# Patient Record
Sex: Female | Born: 1980 | Race: Black or African American | Hispanic: Yes | Marital: Single | Smoking: Current every day smoker
Health system: Southern US, Community
[De-identification: ages and names within clinical notes are randomized; demographics above are authoritative.]

## PROBLEM LIST (undated history)

## (undated) ENCOUNTER — Inpatient Hospital Stay (HOSPITAL_COMMUNITY): Payer: Self-pay

## (undated) DIAGNOSIS — R7303 Prediabetes: Secondary | ICD-10-CM

## (undated) DIAGNOSIS — R002 Palpitations: Secondary | ICD-10-CM

## (undated) DIAGNOSIS — F329 Major depressive disorder, single episode, unspecified: Secondary | ICD-10-CM

## (undated) DIAGNOSIS — F909 Attention-deficit hyperactivity disorder, unspecified type: Secondary | ICD-10-CM

## (undated) DIAGNOSIS — F32A Depression, unspecified: Secondary | ICD-10-CM

## (undated) DIAGNOSIS — I10 Essential (primary) hypertension: Secondary | ICD-10-CM

## (undated) DIAGNOSIS — J069 Acute upper respiratory infection, unspecified: Secondary | ICD-10-CM

## (undated) DIAGNOSIS — G43909 Migraine, unspecified, not intractable, without status migrainosus: Secondary | ICD-10-CM

## (undated) DIAGNOSIS — F419 Anxiety disorder, unspecified: Secondary | ICD-10-CM

## (undated) DIAGNOSIS — M419 Scoliosis, unspecified: Secondary | ICD-10-CM

## (undated) DIAGNOSIS — G8929 Other chronic pain: Secondary | ICD-10-CM

## (undated) DIAGNOSIS — J45909 Unspecified asthma, uncomplicated: Secondary | ICD-10-CM

## (undated) DIAGNOSIS — M549 Dorsalgia, unspecified: Secondary | ICD-10-CM

## (undated) DIAGNOSIS — R000 Tachycardia, unspecified: Secondary | ICD-10-CM

## (undated) DIAGNOSIS — O4400 Placenta previa specified as without hemorrhage, unspecified trimester: Secondary | ICD-10-CM

## (undated) DIAGNOSIS — E663 Overweight: Secondary | ICD-10-CM

## (undated) HISTORY — DX: Overweight: E66.3

## (undated) HISTORY — DX: Other chronic pain: G89.29

## (undated) HISTORY — DX: Palpitations: R00.2

## (undated) HISTORY — PX: WISDOM TOOTH EXTRACTION: SHX21

## (undated) HISTORY — DX: Dorsalgia, unspecified: M54.9

## (undated) HISTORY — DX: Acute upper respiratory infection, unspecified: J06.9

## (undated) HISTORY — PX: TUBAL LIGATION: SHX77

## (undated) HISTORY — DX: Tachycardia, unspecified: R00.0

## (undated) HISTORY — DX: Essential (primary) hypertension: I10

## (undated) HISTORY — DX: Prediabetes: R73.03

---

## 2001-11-14 HISTORY — PX: OTHER SURGICAL HISTORY: SHX169

## 2011-01-21 ENCOUNTER — Emergency Department (HOSPITAL_COMMUNITY)
Admission: EM | Admit: 2011-01-21 | Discharge: 2011-01-21 | Disposition: A | Discharge status: Home / Self Care | Payer: Medicaid - Out of State | Attending: Emergency Medicine | Admitting: Emergency Medicine

## 2011-01-21 DIAGNOSIS — R6884 Jaw pain: Secondary | ICD-10-CM | POA: Insufficient documentation

## 2011-02-15 ENCOUNTER — Inpatient Hospital Stay (INDEPENDENT_AMBULATORY_CARE_PROVIDER_SITE_OTHER)
Admission: RE | Admit: 2011-02-15 | Discharge: 2011-02-15 | Disposition: A | Discharge status: Home / Self Care | Payer: Medicaid - Out of State | Source: Ambulatory Visit | Attending: Family Medicine | Admitting: Family Medicine

## 2011-02-15 DIAGNOSIS — H60399 Other infective otitis externa, unspecified ear: Secondary | ICD-10-CM

## 2011-06-05 ENCOUNTER — Emergency Department (HOSPITAL_COMMUNITY)
Admission: EM | Admit: 2011-06-05 | Discharge: 2011-06-06 | Disposition: A | Discharge status: Home / Self Care | Payer: Medicaid Other | Source: Home / Self Care | Attending: Emergency Medicine | Admitting: Emergency Medicine

## 2011-06-05 DIAGNOSIS — F3289 Other specified depressive episodes: Secondary | ICD-10-CM | POA: Insufficient documentation

## 2011-06-05 DIAGNOSIS — Z79899 Other long term (current) drug therapy: Secondary | ICD-10-CM | POA: Insufficient documentation

## 2011-06-05 DIAGNOSIS — F411 Generalized anxiety disorder: Secondary | ICD-10-CM | POA: Insufficient documentation

## 2011-06-05 DIAGNOSIS — F329 Major depressive disorder, single episode, unspecified: Secondary | ICD-10-CM | POA: Insufficient documentation

## 2011-06-05 DIAGNOSIS — Z5900 Homelessness unspecified: Secondary | ICD-10-CM | POA: Insufficient documentation

## 2011-06-05 DIAGNOSIS — K859 Acute pancreatitis without necrosis or infection, unspecified: Secondary | ICD-10-CM | POA: Insufficient documentation

## 2011-06-05 DIAGNOSIS — F17200 Nicotine dependence, unspecified, uncomplicated: Secondary | ICD-10-CM | POA: Insufficient documentation

## 2011-06-06 ENCOUNTER — Emergency Department (HOSPITAL_COMMUNITY): Payer: Medicaid Other

## 2011-06-06 ENCOUNTER — Emergency Department (HOSPITAL_COMMUNITY)
Admission: EM | Admit: 2011-06-06 | Discharge: 2011-06-07 | Disposition: A | Discharge status: Home / Self Care | Payer: Medicaid Other | Attending: Emergency Medicine | Admitting: Emergency Medicine

## 2011-06-06 LAB — URINE MICROSCOPIC-ADD ON

## 2011-06-06 LAB — WET PREP, GENITAL
Clue Cells Wet Prep HPF POC: NONE SEEN
Trich, Wet Prep: NONE SEEN
Yeast Wet Prep HPF POC: NONE SEEN

## 2011-06-06 LAB — URINALYSIS, ROUTINE W REFLEX MICROSCOPIC
Bilirubin Urine: NEGATIVE
Glucose, UA: NEGATIVE mg/dL
Ketones, ur: NEGATIVE mg/dL
Leukocytes, UA: NEGATIVE
Nitrite: NEGATIVE
Protein, ur: NEGATIVE mg/dL
Specific Gravity, Urine: 1.018 (ref 1.005–1.030)
Urobilinogen, UA: 1 mg/dL (ref 0.0–1.0)
pH: 7.5 (ref 5.0–8.0)

## 2011-06-06 LAB — COMPREHENSIVE METABOLIC PANEL
ALT: 12 U/L (ref 0–35)
AST: 17 U/L (ref 0–37)
Albumin: 3.9 g/dL (ref 3.5–5.2)
Alkaline Phosphatase: 83 U/L (ref 39–117)
BUN: 7 mg/dL (ref 6–23)
CO2: 22 mEq/L (ref 19–32)
Calcium: 8.9 mg/dL (ref 8.4–10.5)
Chloride: 104 mEq/L (ref 96–112)
Creatinine, Ser: 0.48 mg/dL — ABNORMAL LOW (ref 0.50–1.10)
GFR calc Af Amer: >60 mL/min (ref >60)
GFR calc non Af Amer: >60 mL/min (ref >60)
Glucose, Bld: 101 mg/dL — ABNORMAL HIGH (ref 70–99)
Potassium: 3.2 mEq/L — ABNORMAL LOW (ref 3.5–5.1)
Sodium: 136 mEq/L (ref 135–145)
Total Bilirubin: 0.3 mg/dL (ref 0.3–1.2)
Total Protein: 7.5 g/dL (ref 6.0–8.3)

## 2011-06-06 LAB — CBC
HCT: 39.6 % (ref 36.0–46.0)
Hemoglobin: 13.6 g/dL (ref 12.0–15.0)
MCH: 28.8 pg (ref 26.0–34.0)
MCHC: 34.3 g/dL (ref 30.0–36.0)
MCV: 83.7 fL (ref 78.0–100.0)
Platelets: 280 10*3/uL (ref 150–400)
RBC: 4.73 MIL/uL (ref 3.87–5.11)
RDW: 13.9 % (ref 11.5–15.5)
WBC: 10.8 10*3/uL — ABNORMAL HIGH (ref 4.0–10.5)

## 2011-06-06 LAB — DIFFERENTIAL
Basophils Absolute: 0.1 10*3/uL (ref 0.0–0.1)
Basophils Relative: 1 % (ref 0–1)
Eosinophils Absolute: 0.4 10*3/uL (ref 0.0–0.7)
Eosinophils Relative: 4 % (ref 0–5)
Lymphocytes Relative: 24 % (ref 12–46)
Lymphs Abs: 2.6 10*3/uL (ref 0.7–4.0)
Monocytes Absolute: 0.5 10*3/uL (ref 0.1–1.0)
Monocytes Relative: 5 % (ref 3–12)
Neutro Abs: 7.2 10*3/uL (ref 1.7–7.7)
Neutrophils Relative %: 67 % (ref 43–77)

## 2011-06-06 LAB — TYPE AND SCREEN
ABO/RH(D): B POS
Antibody Screen: NEGATIVE

## 2011-06-06 LAB — POCT PREGNANCY, URINE: Preg Test, Ur: POSITIVE

## 2011-06-06 LAB — LIPASE, BLOOD: Lipase: 68 U/L — ABNORMAL HIGH (ref 11–59)

## 2011-06-06 LAB — HCG, QUANTITATIVE, PREGNANCY: hCG, Beta Chain, Quant, S: 1 m[IU]/mL (ref <5)

## 2011-06-06 LAB — ABO/RH: ABO/RH(D): B POS

## 2011-06-07 LAB — GC/CHLAMYDIA PROBE AMP, GENITAL
Chlamydia, DNA Probe: NEGATIVE
GC Probe Amp, Genital: NEGATIVE

## 2011-07-06 ENCOUNTER — Emergency Department (HOSPITAL_COMMUNITY)
Admission: EM | Admit: 2011-07-06 | Discharge: 2011-07-06 | Disposition: A | Discharge status: Home / Self Care | Payer: Medicaid Other | Attending: Emergency Medicine | Admitting: Emergency Medicine

## 2011-07-06 DIAGNOSIS — R109 Unspecified abdominal pain: Secondary | ICD-10-CM | POA: Insufficient documentation

## 2011-07-06 DIAGNOSIS — Z5900 Homelessness unspecified: Secondary | ICD-10-CM | POA: Insufficient documentation

## 2011-07-06 DIAGNOSIS — F17200 Nicotine dependence, unspecified, uncomplicated: Secondary | ICD-10-CM | POA: Insufficient documentation

## 2011-07-06 DIAGNOSIS — O219 Vomiting of pregnancy, unspecified: Secondary | ICD-10-CM | POA: Insufficient documentation

## 2011-07-06 LAB — CBC
HCT: 38.1 % (ref 36.0–46.0)
Hemoglobin: 13.1 g/dL (ref 12.0–15.0)
MCH: 28.5 pg (ref 26.0–34.0)
MCHC: 34.4 g/dL (ref 30.0–36.0)
MCV: 83 fL (ref 78.0–100.0)
Platelets: ADEQUATE 10*3/uL (ref 150–400)
RBC: 4.59 MIL/uL (ref 3.87–5.11)
RDW: 14.1 % (ref 11.5–15.5)
WBC: 12.7 10*3/uL — ABNORMAL HIGH (ref 4.0–10.5)

## 2011-07-06 LAB — COMPREHENSIVE METABOLIC PANEL
ALT: 10 U/L (ref 0–35)
AST: 16 U/L (ref 0–37)
Albumin: 3.7 g/dL (ref 3.5–5.2)
Alkaline Phosphatase: 78 U/L (ref 39–117)
BUN: 9 mg/dL (ref 6–23)
CO2: 20 mEq/L (ref 19–32)
Calcium: 8.8 mg/dL (ref 8.4–10.5)
Chloride: 107 mEq/L (ref 96–112)
Creatinine, Ser: 0.52 mg/dL (ref 0.50–1.10)
GFR calc Af Amer: >60 mL/min (ref >60)
GFR calc non Af Amer: >60 mL/min (ref >60)
Glucose, Bld: 83 mg/dL (ref 70–99)
Potassium: 3.9 mEq/L (ref 3.5–5.1)
Sodium: 136 mEq/L (ref 135–145)
Total Bilirubin: 0.2 mg/dL — ABNORMAL LOW (ref 0.3–1.2)
Total Protein: 7.6 g/dL (ref 6.0–8.3)

## 2011-07-06 LAB — LIPASE, BLOOD: Lipase: 60 U/L — ABNORMAL HIGH (ref 11–59)

## 2011-07-06 LAB — URINALYSIS, ROUTINE W REFLEX MICROSCOPIC
Bilirubin Urine: NEGATIVE
Glucose, UA: NEGATIVE mg/dL
Hgb urine dipstick: NEGATIVE
Ketones, ur: NEGATIVE mg/dL
Leukocytes, UA: NEGATIVE
Nitrite: NEGATIVE
Protein, ur: NEGATIVE mg/dL
Specific Gravity, Urine: 1.029 (ref 1.005–1.030)
Urobilinogen, UA: 0.2 mg/dL (ref 0.0–1.0)
pH: 5.5 (ref 5.0–8.0)

## 2011-07-06 LAB — DIFFERENTIAL
Basophils Absolute: 0.1 10*3/uL (ref 0.0–0.1)
Basophils Relative: 1 % (ref 0–1)
Eosinophils Absolute: 0.4 10*3/uL (ref 0.0–0.7)
Eosinophils Relative: 3 % (ref 0–5)
Lymphocytes Relative: 25 % (ref 12–46)
Lymphs Abs: 3.2 10*3/uL (ref 0.7–4.0)
Monocytes Absolute: 0.8 10*3/uL (ref 0.1–1.0)
Monocytes Relative: 6 % (ref 3–12)
Neutro Abs: 8.2 10*3/uL — ABNORMAL HIGH (ref 1.7–7.7)
Neutrophils Relative %: 65 % (ref 43–77)

## 2011-07-06 LAB — HCG, QUANTITATIVE, PREGNANCY: hCG, Beta Chain, Quant, S: 53 m[IU]/mL — ABNORMAL HIGH (ref <5)

## 2011-07-06 LAB — POCT PREGNANCY, URINE: Preg Test, Ur: POSITIVE

## 2011-07-07 ENCOUNTER — Inpatient Hospital Stay (EMERGENCY_DEPARTMENT_HOSPITAL)
Admission: AD | Admit: 2011-07-07 | Discharge: 2011-07-08 | Disposition: A | Discharge status: Home / Self Care | Payer: Medicaid Other | Source: Ambulatory Visit | Attending: Obstetrics & Gynecology | Admitting: Obstetrics & Gynecology

## 2011-07-07 ENCOUNTER — Inpatient Hospital Stay (HOSPITAL_COMMUNITY): Payer: Medicaid Other

## 2011-07-07 ENCOUNTER — Encounter (HOSPITAL_COMMUNITY): Payer: Self-pay

## 2011-07-07 DIAGNOSIS — B9689 Other specified bacterial agents as the cause of diseases classified elsewhere: Secondary | ICD-10-CM

## 2011-07-07 DIAGNOSIS — R109 Unspecified abdominal pain: Secondary | ICD-10-CM | POA: Insufficient documentation

## 2011-07-07 DIAGNOSIS — A499 Bacterial infection, unspecified: Secondary | ICD-10-CM

## 2011-07-07 DIAGNOSIS — N760 Acute vaginitis: Secondary | ICD-10-CM

## 2011-07-07 DIAGNOSIS — O26899 Other specified pregnancy related conditions, unspecified trimester: Secondary | ICD-10-CM

## 2011-07-07 DIAGNOSIS — O99891 Other specified diseases and conditions complicating pregnancy: Secondary | ICD-10-CM | POA: Insufficient documentation

## 2011-07-07 LAB — CBC
HCT: 39.6 % (ref 36.0–46.0)
Hemoglobin: 13.6 g/dL (ref 12.0–15.0)
MCH: 28.8 pg (ref 26.0–34.0)
MCHC: 34.3 g/dL (ref 30.0–36.0)
MCV: 83.9 fL (ref 78.0–100.0)
Platelets: 297 10*3/uL (ref 150–400)
RBC: 4.72 MIL/uL (ref 3.87–5.11)
RDW: 14.1 % (ref 11.5–15.5)
WBC: 9.6 10*3/uL (ref 4.0–10.5)

## 2011-07-07 LAB — HCG, QUANTITATIVE, PREGNANCY: hCG, Beta Chain, Quant, S: 93 m[IU]/mL — ABNORMAL HIGH (ref <5)

## 2011-07-07 NOTE — ED Provider Notes (Signed)
History     Chief Complaint  Patient presents with  . Follow-up   HPI Here c/o low abdomen pain. Was seen at [facility] yesterday, quant of 53 at that time. Was told to f/u here for repeat quant and u/s. No bleeding.   OB History    Grav Para Term Preterm Abortions TAB SAB Ect Mult Living   1               No past medical history on file.  No past surgical history on file.  No family history on file.  History  Substance Use Topics  . Smoking status: Not on file  . Smokeless tobacco: Not on file  . Alcohol Use: Not on file    Allergies: Allergies not on file  No prescriptions prior to admission    Review of Systems  Constitutional: Negative.   Respiratory: Negative.   Cardiovascular: Negative.   Gastrointestinal: Positive for nausea and abdominal pain. Negative for vomiting, diarrhea and constipation.  Genitourinary: Negative for dysuria, urgency, frequency, hematuria and flank pain.       Negative for vaginal bleeding, cramping/contractions  Musculoskeletal: Negative.   Neurological: Negative.   Psychiatric/Behavioral: Negative.     Physical Exam   Blood pressure 130/82, pulse 76, temperature 98.5 F (36.9 C), temperature source Oral, resp. rate 16, height 5\' 3"  (1.6 m), weight 75.841 kg (167 lb 3.2 oz), last menstrual period 06/05/2011, SpO2 99.00%.  Physical Exam  Nursing note and vitals reviewed. Constitutional: She is oriented to person, place, and time. She appears well-developed and well-nourished. No distress.  HENT:  Head: Normocephalic and atraumatic.  Cardiovascular: Normal rate.   Respiratory: Effort normal.  GI: Soft. Bowel sounds are normal. She exhibits no mass. There is no tenderness. There is no rebound and no guarding.  Genitourinary: There is no rash or lesion on the right labia. There is no rash or lesion on the left labia. Uterus is not enlarged and not tender. Cervix exhibits no motion tenderness, no discharge and no friability. Right adnexum  displays no mass, no tenderness and no fullness. Left adnexum displays no mass, no tenderness and no fullness. No tenderness or bleeding around the vagina. Vaginal discharge (white) found.  Musculoskeletal: Normal range of motion.  Neurological: She is alert and oriented to person, place, and time.  Skin: Skin is warm and dry.  Psychiatric: She has a normal mood and affect.    MAU Course  Procedures  Results for orders placed during the hospital encounter of 07/07/11 (from the past 24 hour(s))  CBC     Status: Normal   Collection Time   07/07/11  7:04 PM      Component Value Range   WBC 9.6  4.0 - 10.5 (K/uL)   RBC 4.72  3.87 - 5.11 (MIL/uL)   Hemoglobin 13.6  12.0 - 15.0 (g/dL)   HCT 16.1  09.6 - 04.5 (%)   MCV 83.9  78.0 - 100.0 (fL)   MCH 28.8  26.0 - 34.0 (pg)   MCHC 34.3  30.0 - 36.0 (g/dL)   RDW 40.9  81.1 - 91.4 (%)   Platelets 297  150 - 400 (K/uL)  HCG, QUANTITATIVE, PREGNANCY     Status: Abnormal   Collection Time   07/07/11  7:04 PM      Component Value Range   hCG, Beta Chain, Quant, S 93 (*) <5 (mIU/mL)  WET PREP, GENITAL     Status: Abnormal   Collection Time   07/07/11  11:57 PM      Component Value Range   Yeast, Wet Prep NONE SEEN  NONE SEEN    Trich, Wet Prep NONE SEEN  NONE SEEN    Clue Cells, Wet Prep FEW (*) NONE SEEN    WBC, Wet Prep HPF POC FEW (*) NONE SEEN    US Ob Comp Less 14 Wks  07/07/2011  *RADIOLOGY REPORT*  Clinical Data: Abdominal pain.  OBSTETRIC <14 WK [place] AND TRANSVAGINAL OB US  Technique:  Both transabdominal and transvaginal ultrasound examinations were performed for complete evaluation of the gestation as well as the maternal uterus, adnexal regions, and pelvic cul-de-sac.  Transvaginal technique was performed to assess early pregnancy.  Comparison:  Prior ultrasound of pregnancy performed 06/06/2011.  Intrauterine gestational sac:  None seen. Yolk sac: N/A Embryo: N/A Cardiac Activity: N/A  Maternal uterus/adnexae: There is a irregular 3.2 x  0.7 x 1.2 cm collection of relatively hypoechoic fluid within the endometrial canal at the lower uterine segment.  This may reflect trace bleeding; no intrauterine gestational sac is identified.  The patient's quantitative beta-HCG of 93 remains far too low to characterize the gestational sac or exclude ectopic pregnancy.  The ovaries are unremarkable in appearance.  A heterogeneous 1.9 cm focus within the right ovary may reflect a mildly hemorrhagic corpus luteal cyst.  The right ovary measures 4.5 x 2.6 x 2.4 cm, while the left ovary measures 3.3 x 1.7 x 1.3 cm.  There is no evidence for ovarian torsion.  A small amount of free fluid is noted within the pelvis.  IMPRESSION:  1.  No intrauterine gestational sac identified.  The patient's quantitative beta-HCG of 93 remains far similar to characterize the gestational sac or exclude ectopic pregnancy.  Would perform follow- up pelvic ultrasound in 2 weeks to assess for intrauterine gestational sac. 2.  Irregular collection of relatively hypoechoic fluid in the endometrial canal at the lower uterine segment; this may reflect a small amount of blood within the endometrial canal.  Original Report Authenticated By: Tonia Ghent, M.D.    Assessment and Plan  30 y.o. [id number] at [redacted]w[redacted]d Early pregnancy with abdominal pain F/U in 48 hours for repeat quant Precautions rev'd   Archie Shea 07/07/2011, 10:00 PM

## 2011-07-07 NOTE — Progress Notes (Signed)
Pt sates she was seen at [facility] on 8-22, had a BHCG and was instructed to return to MAU for BHCG and ultrasound. Pt states she has some pain off and on.

## 2011-07-08 ENCOUNTER — Inpatient Hospital Stay (HOSPITAL_COMMUNITY)
Admission: AD | Admit: 2011-07-08 | Discharge: 2011-07-09 | Disposition: A | Discharge status: Home / Self Care | Payer: Medicaid Other | Source: Ambulatory Visit | Attending: Obstetrics & Gynecology | Admitting: Obstetrics & Gynecology

## 2011-07-08 ENCOUNTER — Encounter (HOSPITAL_COMMUNITY): Payer: Self-pay | Admitting: Advanced Practice Midwife

## 2011-07-08 DIAGNOSIS — O26899 Other specified pregnancy related conditions, unspecified trimester: Secondary | ICD-10-CM | POA: Diagnosis present

## 2011-07-08 DIAGNOSIS — R109 Unspecified abdominal pain: Secondary | ICD-10-CM | POA: Insufficient documentation

## 2011-07-08 DIAGNOSIS — N760 Acute vaginitis: Secondary | ICD-10-CM | POA: Diagnosis present

## 2011-07-08 DIAGNOSIS — O210 Mild hyperemesis gravidarum: Secondary | ICD-10-CM | POA: Insufficient documentation

## 2011-07-08 DIAGNOSIS — B9689 Other specified bacterial agents as the cause of diseases classified elsewhere: Secondary | ICD-10-CM | POA: Diagnosis present

## 2011-07-08 DIAGNOSIS — O219 Vomiting of pregnancy, unspecified: Secondary | ICD-10-CM | POA: Diagnosis present

## 2011-07-08 LAB — WET PREP, GENITAL
Trich, Wet Prep: NONE SEEN
Yeast Wet Prep HPF POC: NONE SEEN

## 2011-07-08 MED ORDER — ACETAMINOPHEN 325 MG PO TABS
650.0000 mg | ORAL_TABLET | Freq: Once | ORAL | Status: AC
  Administered 2011-07-08: 650 mg via ORAL
  Filled 2011-07-08: qty 2

## 2011-07-08 MED ORDER — PROMETHAZINE HCL 25 MG PO TABS
25.0000 mg | ORAL_TABLET | Freq: Once | ORAL | Status: AC
  Administered 2011-07-08: 25 mg via ORAL
  Filled 2011-07-08: qty 1

## 2011-07-08 MED ORDER — PROMETHAZINE HCL 25 MG PO TABS: 25.0000 mg | ORAL_TABLET | Freq: Once | ORAL | Status: DC

## 2011-07-08 MED ORDER — METRONIDAZOLE 500 MG PO TABS: 500.0000 mg | ORAL_TABLET | Freq: Two times a day (BID) | ORAL | Status: AC

## 2011-07-08 NOTE — Progress Notes (Signed)
Pt given taxi voucher per house coverage.

## 2011-07-09 ENCOUNTER — Encounter (HOSPITAL_COMMUNITY): Payer: Self-pay | Admitting: *Deleted

## 2011-07-09 DIAGNOSIS — O219 Vomiting of pregnancy, unspecified: Secondary | ICD-10-CM | POA: Diagnosis present

## 2011-07-09 LAB — URINALYSIS, ROUTINE W REFLEX MICROSCOPIC
Bilirubin Urine: NEGATIVE
Glucose, UA: NEGATIVE mg/dL
Hgb urine dipstick: NEGATIVE
Ketones, ur: NEGATIVE mg/dL
Leukocytes, UA: NEGATIVE
Nitrite: NEGATIVE
Protein, ur: NEGATIVE mg/dL
Specific Gravity, Urine: >1.03 — ABNORMAL HIGH (ref 1.005–1.030)
Urobilinogen, UA: 1 mg/dL (ref 0.0–1.0)
pH: 6 (ref 5.0–8.0)

## 2011-07-09 MED ORDER — ONDANSETRON 8 MG PO TBDP
8.0000 mg | ORAL_TABLET | Freq: Once | ORAL | Status: AC
  Administered 2011-07-09: 8 mg via ORAL
  Filled 2011-07-09: qty 1

## 2011-07-09 NOTE — ED Notes (Signed)
Penny [name] CNM spoke to pt after Triage and before pt brought back to room. Was seen MAU last pm and has not started med for BV as prescribed last pm. G3P2  At 5wks. Having nausea and some cramping

## 2011-07-09 NOTE — ED Provider Notes (Signed)
History     Chief Complaint  Patient presents with  . Abdominal Cramping  . Chills   HPI Pt c/o nausea and vomiting. Vomited 5 times in last 24 hours. Tolerating PO fluids. Also has ongoing cramping. Seen yesterday in MAU for cramping, u/s negative, has f/u HCG planned for tomorrow. + BV yesterday. Was given rx for flagyl and phenergan, did not get filled, cannot afford. Lives in homeless shelter.    OB History    Grav Para Term Preterm Abortions TAB SAB Ect Mult Living   3 2 2       2       No past medical history on file.  No past surgical history on file.  No family history on file.  History  Substance Use Topics  . Smoking status: Not on file  . Smokeless tobacco: Not on file  . Alcohol Use: Not on file    Allergies: No Known Allergies  Prescriptions prior to admission  Medication Sig Dispense Refill  . metroNIDAZOLE (FLAGYL) 500 MG tablet Take 1 tablet (500 mg total) by mouth 2 (two) times daily.  14 tablet  0  . promethazine (PHENERGAN) 25 MG tablet Take 1 tablet (25 mg total) by mouth once.  30 tablet  0    Review of Systems  Constitutional: Negative.   Respiratory: Negative.   Cardiovascular: Negative.   Gastrointestinal: Positive for nausea and vomiting. Negative for abdominal pain, diarrhea and constipation.  Genitourinary: Negative for dysuria, urgency, frequency, hematuria and flank pain.       Negative for vaginal bleeding, positive for cramping  Musculoskeletal: Negative.   Neurological: Negative.   Psychiatric/Behavioral: Negative.    Physical Exam   Blood pressure 114/79, pulse 80, temperature 98.4 F (36.9 C), resp. rate 18, last menstrual period 06/05/2011.  Physical Exam  Constitutional: She is oriented to person, place, and time. She appears well-developed and well-nourished. No distress.  Cardiovascular: Normal rate.   Respiratory: Effort normal.  Musculoskeletal: Normal range of motion.  Neurological: She is alert and oriented to person,  place, and time.  Skin: Skin is warm and dry.  Psychiatric: She has a normal mood and affect.    MAU Course  Procedures  Results for orders placed during the hospital encounter of 07/08/11 (from the past 24 hour(s))  URINALYSIS, ROUTINE W REFLEX MICROSCOPIC     Status: Abnormal   Collection Time   07/09/11  1:06 AM      Component Value Range   Color, Urine YELLOW  YELLOW    Appearance CLEAR  CLEAR    Specific Gravity, Urine >1.030 (*) 1.005 - 1.030    pH 6.0  5.0 - 8.0    Glucose, UA NEGATIVE  NEGATIVE (mg/dL)   Hgb urine dipstick NEGATIVE  NEGATIVE    Bilirubin Urine NEGATIVE  NEGATIVE    Ketones, ur NEGATIVE  NEGATIVE (mg/dL)   Protein, ur NEGATIVE  NEGATIVE (mg/dL)   Urobilinogen, UA 1.0  0.0 - 1.0 (mg/dL)   Nitrite NEGATIVE  NEGATIVE    Leukocytes, UA NEGATIVE  NEGATIVE    Nausea improved with Zofran ODT  Tolerating PO fluids and crackers  Assessment and Plan  30 y.o. [id number] at [redacted]w[redacted]d N/V of early pregnancy Encouraged to get phenergan rx filled F/U tomorrow for repeat quant  Tyrihanna Wingert 07/09/2011, 2:23 AM

## 2011-07-09 NOTE — Progress Notes (Signed)
Pt states, " I am having vomiting and thrown up four times today. I'm cramping more today, and I m not suppossed to return for another [place] for a week. I was here last night and had an [place]."

## 2011-07-10 ENCOUNTER — Inpatient Hospital Stay (HOSPITAL_COMMUNITY)
Admission: AD | Admit: 2011-07-10 | Discharge: 2011-07-10 | Disposition: A | Discharge status: Home / Self Care | Payer: Medicaid Other | Source: Ambulatory Visit | Attending: Obstetrics and Gynecology | Admitting: Obstetrics and Gynecology

## 2011-07-10 ENCOUNTER — Inpatient Hospital Stay (HOSPITAL_COMMUNITY): Admit: 2011-07-10 | Payer: Medicaid Other

## 2011-07-10 DIAGNOSIS — O26899 Other specified pregnancy related conditions, unspecified trimester: Secondary | ICD-10-CM

## 2011-07-10 DIAGNOSIS — O2690 Pregnancy related conditions, unspecified, unspecified trimester: Secondary | ICD-10-CM | POA: Insufficient documentation

## 2011-07-10 DIAGNOSIS — R109 Unspecified abdominal pain: Secondary | ICD-10-CM

## 2011-07-10 LAB — HCG, QUANTITATIVE, PREGNANCY: hCG, Beta Chain, Quant, S: 336 m[IU]/mL — ABNORMAL HIGH (ref <5)

## 2011-07-10 NOTE — ED Provider Notes (Addendum)
History     Chief Complaint  Patient presents with  . Possible Pregnancy   HPI  Penny Davis71 y.o. presents for followup BHCG>  She was initially seen at [facility] on 8/22 with BHCG 53.  She returned here on 8/23 with abdominal pain in early pregnancy without bleeding.  On that date her BLOOD TYPE --B+. BHCG 93. Ultrasound did not see Yolk Sac, Fetal Pole or Cardiac activity--too early.  She was dx with Bacterial Vaginosis and given Rxs for Flagyl and Phenergan.  She returned yesterday with vomiting and cramping.  Had not filled Phenergan Rx.   She denies bleeding and pain today.    Past Medical History  Diagnosis Date  . No pertinent past medical history     Past Surgical History  Procedure Date  . Cesarean section     No family history on file.  History  Substance Use Topics  . Smoking status: Current Everyday Smoker  . Smokeless tobacco: Never Used  . Alcohol Use: No     PReviously drank on occ    Allergies: No Known Allergies  Prescriptions prior to admission  Medication Sig Dispense Refill  . metroNIDAZOLE (FLAGYL) 500 MG tablet Take 1 tablet (500 mg total) by mouth 2 (two) times daily.  14 tablet  0  . promethazine (PHENERGAN) 25 MG tablet Take 1 tablet (25 mg total) by mouth once.  30 tablet  0    ROS Physical Exam   Blood pressure 120/65, pulse 65, temperature 98.8 F (37.1 C), temperature source Oral, resp. rate 16, last menstrual period 06/05/2011.  Physical Exam  Not indicated.   Results for orders placed during the hospital encounter of 07/10/11 (from the past 24 hour(s))  HCG, QUANTITATIVE, PREGNANCY     Status: Abnormal   Collection Time   07/10/11  7:31 AM      Component Value Range   hCG, Beta Chain, Quant, S 336 (*) <5 (mIU/mL)   MAU Course  Procedures  Repeat BHCG  MDM  Reported patient's hx and BHCG results to Dr. Emelda Fear.  Pt denies bleeding and pain today.  Order given for repeat U/S in 1 week for viability.  Ectopic precautions will  be given to patient.    Assessment and Plan  Assessement: Early gestation with rising BHCGs  Plan  Repeat Ultrasound in 1 week for viability--they will call her to make appointment.           Ectopic precautions given to patient.     Hansford Hirt,EVE M 07/10/2011, 8:41 AM

## 2011-07-10 NOTE — Progress Notes (Signed)
No bleeding no pain

## 2011-07-12 ENCOUNTER — Other Ambulatory Visit: Payer: Self-pay | Admitting: Obstetrics & Gynecology

## 2011-07-12 DIAGNOSIS — Z1389 Encounter for screening for other disorder: Secondary | ICD-10-CM

## 2011-07-14 ENCOUNTER — Encounter (HOSPITAL_COMMUNITY): Payer: Self-pay | Admitting: *Deleted

## 2011-07-14 ENCOUNTER — Inpatient Hospital Stay (HOSPITAL_COMMUNITY)
Admission: AD | Admit: 2011-07-14 | Discharge: 2011-07-15 | Disposition: A | Discharge status: Home / Self Care | Payer: Medicaid Other | Source: Ambulatory Visit | Attending: Obstetrics & Gynecology | Admitting: Obstetrics & Gynecology

## 2011-07-14 DIAGNOSIS — O99891 Other specified diseases and conditions complicating pregnancy: Secondary | ICD-10-CM | POA: Insufficient documentation

## 2011-07-14 DIAGNOSIS — O34599 Maternal care for other abnormalities of gravid uterus, unspecified trimester: Secondary | ICD-10-CM | POA: Insufficient documentation

## 2011-07-14 DIAGNOSIS — O26899 Other specified pregnancy related conditions, unspecified trimester: Secondary | ICD-10-CM

## 2011-07-14 DIAGNOSIS — R109 Unspecified abdominal pain: Secondary | ICD-10-CM

## 2011-07-14 DIAGNOSIS — N83209 Unspecified ovarian cyst, unspecified side: Secondary | ICD-10-CM

## 2011-07-14 LAB — CBC
HCT: 37.9 % (ref 36.0–46.0)
Hemoglobin: 12.7 g/dL (ref 12.0–15.0)
MCH: 28.4 pg (ref 26.0–34.0)
MCHC: 33.5 g/dL (ref 30.0–36.0)
MCV: 84.8 fL (ref 78.0–100.0)
Platelets: 302 10*3/uL (ref 150–400)
RBC: 4.47 MIL/uL (ref 3.87–5.11)
RDW: 14.6 % (ref 11.5–15.5)
WBC: 12 10*3/uL — ABNORMAL HIGH (ref 4.0–10.5)

## 2011-07-14 LAB — DIFFERENTIAL
Basophils Absolute: 0.1 10*3/uL (ref 0.0–0.1)
Basophils Relative: 1 % (ref 0–1)
Eosinophils Absolute: 0.3 10*3/uL (ref 0.0–0.7)
Eosinophils Relative: 3 % (ref 0–5)
Lymphocytes Relative: 27 % (ref 12–46)
Lymphs Abs: 3.3 10*3/uL (ref 0.7–4.0)
Monocytes Absolute: 0.5 10*3/uL (ref 0.1–1.0)
Monocytes Relative: 4 % (ref 3–12)
Neutro Abs: 7.8 10*3/uL — ABNORMAL HIGH (ref 1.7–7.7)
Neutrophils Relative %: 65 % (ref 43–77)

## 2011-07-14 NOTE — Progress Notes (Signed)
Pt states she has pain in her rt lower groin are/side area present x 2 days that has gotten worse and worse and also has some cramping

## 2011-07-14 NOTE — ED Provider Notes (Signed)
History     CSN: [id number] Arrival date & time: 07/14/2011 10:54 PM  Chief Complaint  Patient presents with  . Abdominal Pain   HPI Penny [name] is a 30 y.o. female who presents to MAU with pain in her right lower abdomen and groin. She is [redacted] weeks gestation by LMP. Her initial visit was to [facility] on 07/06/11. She had a positive pregnancy test at that time and her Bhcg was 55. Her blood type is B positive. She came to MAU for evaluation 07/07/11 and her Bhcg was 93, and on 8/26 had increased to 333. Ultrasound showed no IUGS. She is scheduled to have a follow up ultrasound next week. The pain has gotten much worse tonight on the right side and associated symptoms include nausea.  Past Medical History  Diagnosis Date  . No pertinent past medical history     Past Surgical History  Procedure Date  . Cesarean section     No family history on file.  History  Substance Use Topics  . Smoking status: Current Everyday Smoker -- 0.5 packs/day  . Smokeless tobacco: Never Used  . Alcohol Use: No     PReviously drank on occ    OB History    Grav Para Term Preterm Abortions TAB SAB Ect Mult Living   3 2 2       2       Review of Systems  Constitutional: Positive for chills, appetite change and fatigue. Negative for fever and diaphoresis.  HENT: Positive for sneezing. Negative for ear pain, congestion, sore throat, facial swelling, neck pain, neck stiffness, dental problem and sinus pressure.   Eyes: Negative for photophobia, pain and discharge.  Respiratory: Positive for cough. Negative for chest tightness and wheezing.   Cardiovascular: Negative.   Gastrointestinal: Positive for nausea, abdominal pain and diarrhea. Negative for vomiting, constipation and abdominal distention.  Genitourinary: Positive for frequency, vaginal pain and pelvic pain. Negative for dysuria, flank pain, vaginal bleeding, vaginal discharge and difficulty urinating.  Musculoskeletal: Positive for back pain.  Negative for myalgias and gait problem.  Skin: Negative for color change and rash.  Neurological: Negative for dizziness, speech difficulty, weakness, light-headedness, numbness and headaches.  Psychiatric/Behavioral: Negative for confusion and agitation.    Physical Exam  BP 134/79  Pulse 79  Temp 98.8 F (37.1 C)  Resp 22  Ht 5\' 4"  (1.626 m)  Wt 169 lb (76.658 kg)  BMI 29.01 kg/m2  LMP 06/05/2011  Physical Exam  Nursing note and vitals reviewed. Constitutional: She is oriented to person, place, and time. She appears well-developed and well-nourished.  Eyes: EOM are normal.  Neck: Neck supple.  Pulmonary/Chest: Effort normal.  Abdominal: Soft. There is no tenderness.  Musculoskeletal: Normal range of motion.  Neurological: She is alert and oriented to person, place, and time. No cranial nerve deficit.  Skin: Skin is warm and dry.    ED Course  Procedures Will repeat ultrasound tonight re: increased pain in lower abdomen. MDM Ultrasound tonight shows a 5 week 1 day IUGS with YS. There is a 2.0 x 1.6 x 1.6 CLC on the right ovary, small amount of FF. I discussed the ultrasound results with the patient and pain associated with CLC.  Assessment: First trimester abdominal pain                        Corpus luteal cyst   Plan:  Tylenol for pain   Cancel ultrasound for next week.  Start Prenatal care

## 2011-07-15 ENCOUNTER — Inpatient Hospital Stay (HOSPITAL_COMMUNITY): Payer: Medicaid Other

## 2011-07-15 LAB — URINALYSIS, ROUTINE W REFLEX MICROSCOPIC
Bilirubin Urine: NEGATIVE
Glucose, UA: NEGATIVE mg/dL
Ketones, ur: NEGATIVE mg/dL
Leukocytes, UA: NEGATIVE
Nitrite: NEGATIVE
Protein, ur: NEGATIVE mg/dL
Specific Gravity, Urine: 1.025 (ref 1.005–1.030)
Urobilinogen, UA: 0.2 mg/dL (ref 0.0–1.0)
pH: 6 (ref 5.0–8.0)

## 2011-07-15 LAB — URINE MICROSCOPIC-ADD ON

## 2011-07-15 LAB — HCG, QUANTITATIVE, PREGNANCY: hCG, Beta Chain, Quant, S: 2292 m[IU]/mL — ABNORMAL HIGH (ref <5)

## 2011-07-15 MED ORDER — ONDANSETRON 8 MG PO TBDP
8.0000 mg | ORAL_TABLET | Freq: Once | ORAL | Status: AC
  Administered 2011-07-15: 8 mg via ORAL
  Filled 2011-07-15: qty 1

## 2011-07-15 MED ORDER — PROMETHAZINE HCL 25 MG PO TABS: 25.0000 mg | ORAL_TABLET | Freq: Four times a day (QID) | ORAL | Status: DC | PRN

## 2011-07-15 MED ORDER — IBUPROFEN 600 MG PO TABS
600.0000 mg | ORAL_TABLET | Freq: Once | ORAL | Status: AC
  Administered 2011-07-15: 600 mg via ORAL
  Filled 2011-07-15: qty 1

## 2011-07-19 ENCOUNTER — Ambulatory Visit (HOSPITAL_COMMUNITY): Admission: RE | Admit: 2011-07-19 | Payer: Medicaid Other | Source: Ambulatory Visit

## 2011-07-19 NOTE — ED Provider Notes (Signed)
Agree with above note.  Ellison Leisure H. 07/19/2011 11:59 PM

## 2011-07-23 ENCOUNTER — Inpatient Hospital Stay (HOSPITAL_COMMUNITY)
Admission: AD | Admit: 2011-07-23 | Discharge: 2011-07-23 | Disposition: A | Discharge status: Home / Self Care | Payer: Medicaid Other | Source: Ambulatory Visit | Attending: Obstetrics and Gynecology | Admitting: Obstetrics and Gynecology

## 2011-07-23 ENCOUNTER — Encounter (HOSPITAL_COMMUNITY): Payer: Self-pay

## 2011-07-23 DIAGNOSIS — F329 Major depressive disorder, single episode, unspecified: Secondary | ICD-10-CM | POA: Insufficient documentation

## 2011-07-23 DIAGNOSIS — O9989 Other specified diseases and conditions complicating pregnancy, childbirth and the puerperium: Secondary | ICD-10-CM | POA: Insufficient documentation

## 2011-07-23 DIAGNOSIS — L293 Anogenital pruritus, unspecified: Secondary | ICD-10-CM | POA: Insufficient documentation

## 2011-07-23 DIAGNOSIS — F411 Generalized anxiety disorder: Secondary | ICD-10-CM | POA: Insufficient documentation

## 2011-07-23 DIAGNOSIS — N898 Other specified noninflammatory disorders of vagina: Secondary | ICD-10-CM

## 2011-07-23 DIAGNOSIS — O99340 Other mental disorders complicating pregnancy, unspecified trimester: Secondary | ICD-10-CM | POA: Insufficient documentation

## 2011-07-23 DIAGNOSIS — F3289 Other specified depressive episodes: Secondary | ICD-10-CM | POA: Insufficient documentation

## 2011-07-23 DIAGNOSIS — Z331 Pregnant state, incidental: Secondary | ICD-10-CM

## 2011-07-23 DIAGNOSIS — G43909 Migraine, unspecified, not intractable, without status migrainosus: Secondary | ICD-10-CM | POA: Insufficient documentation

## 2011-07-23 HISTORY — DX: Depression, unspecified: F32.A

## 2011-07-23 HISTORY — DX: Major depressive disorder, single episode, unspecified: F32.9

## 2011-07-23 HISTORY — DX: Anxiety disorder, unspecified: F41.9

## 2011-07-23 HISTORY — DX: Migraine, unspecified, not intractable, without status migrainosus: G43.909

## 2011-07-23 LAB — URINALYSIS, ROUTINE W REFLEX MICROSCOPIC
Bilirubin Urine: NEGATIVE
Glucose, UA: NEGATIVE mg/dL
Ketones, ur: 15 mg/dL — AB
Nitrite: NEGATIVE
Protein, ur: NEGATIVE mg/dL
Specific Gravity, Urine: >1.03 — ABNORMAL HIGH (ref 1.005–1.030)
Urobilinogen, UA: 1 mg/dL (ref 0.0–1.0)
pH: 5.5 (ref 5.0–8.0)

## 2011-07-23 LAB — URINE MICROSCOPIC-ADD ON

## 2011-07-23 LAB — WET PREP, GENITAL
Clue Cells Wet Prep HPF POC: NONE SEEN
Trich, Wet Prep: NONE SEEN
Yeast Wet Prep HPF POC: NONE SEEN

## 2011-07-23 NOTE — Progress Notes (Signed)
Vaginal itching, burning with yellow vaginal discharge x 3 days, vagina feels swollen, has been seen in MAU for pregnancy

## 2011-07-24 NOTE — ED Provider Notes (Signed)
History     Chief Complaint  Patient presents with  . Vaginal Discharge  . Vaginal Itching   HPI Pt presents with c/o external vaginal itching, burning and feeling of dryness since yesterday.  Denies any increased vag discharge.  Denies any vaginal bleeding.  Denies fever or abdominal pain.  She recently completed a course of metronidazole for BV.  She has not treated her symptoms with any medication.  Pt states she is concerned regarding a possible STD but has no known exposure.  She is scheduled to be seen at [facility] the 1st week of October for her new OB intake.  Pt has been seen at [facility] [place] with US performed which confirmed an IUP with yolk sac, no FHTs at 5w 5d gestation by LMP.  OB History    Grav Para Term Preterm Abortions TAB SAB Ect Mult Living   3 2 1 1  0 0 0 0 0 2      Past Medical History  Diagnosis Date  . Preterm labor   . Anxiety   . Depression   . Migraines     Past Surgical History  Procedure Date  . Cesarean section     No family history on file.  History  Substance Use Topics  . Smoking status: Current Everyday Smoker -- 0.2 packs/day for 12 years    Types: Cigarettes  . Smokeless tobacco: Never Used  . Alcohol Use: No     PReviously drank on occ    Allergies: No Known Allergies  No prescriptions prior to admission    Review of Systems  Constitutional: Negative.   HENT: Negative.   Eyes: Negative.   Gastrointestinal: Positive for nausea. Negative for heartburn, vomiting, abdominal pain, diarrhea, constipation, blood in stool and melena.  Genitourinary: Negative.   Musculoskeletal: Negative.   Skin: Negative.   Neurological: Negative.   Endo/Heme/Allergies: Negative.    Physical Exam   Blood pressure 117/76, pulse 87, temperature 98.9 F (37.2 C), temperature source Oral, resp. rate 16, height 5\' 3"  (1.6 m), weight 76.386 kg (168 lb 6.4 oz), last menstrual period 06/05/2011.  Physical Exam  Constitutional: She is  oriented to person, place, and time. She appears well-developed and well-nourished.  HENT:  Head: Normocephalic and atraumatic.  Right Ear: External ear normal.  Eyes: Conjunctivae and EOM are normal. Pupils are equal, round, and reactive to light.  Neck: Normal range of motion. Neck supple. No thyromegaly present.  Cardiovascular: Normal rate, regular rhythm and normal heart sounds.   Respiratory: Effort normal and breath sounds normal.  GI: Soft. Bowel sounds are normal.  Genitourinary: Vagina normal and uterus normal.  Musculoskeletal: Normal range of motion.  Neurological: She is alert and oriented to person, place, and time. She has normal reflexes.  Skin: Skin is warm and dry.  Psychiatric: She has a normal mood and affect. Her behavior is normal. Judgment and thought content normal.  Extrems:  Neg edema.  Pelvic:  Ext genitalia WNL.  No lesions or ulcerations noted. Vagina:  Pink, without redness.  Nml rugae and tone.  Sm creamy white discharge noted in vault Cervix:  No lesions, discharge or friability present.  Closed/thick.  Ut sl boggy, nontender, approx 7wk size.   Adnexa:  No tenderness or masses present.    MAU Course  Procedures Wet prep-neg    Assessment and Plan  IUP at 7 wks  External vulvar irritation  Consult obtained with Dr. Su Hilt.  Will d/c to home.  Comfort  measures for irritation d/w including Aquaphor as well as A&D. Rec pt try OTC 1% hydrocortisone tid for 2 weeks. Rec begin probiotics. Follow up at [facility] as sched. SAB precs d/w pt Halvor Behrend O. 07/24/2011, 5:07 AM

## 2011-07-25 LAB — GC/CHLAMYDIA PROBE AMP, GENITAL
Chlamydia, DNA Probe: NEGATIVE
GC Probe Amp, Genital: NEGATIVE

## 2011-07-27 LAB — RPR: RPR: NONREACTIVE

## 2011-07-27 LAB — HEPATITIS B SURFACE ANTIGEN: Hepatitis B Surface Ag: NEGATIVE

## 2011-07-27 LAB — HIV ANTIBODY (ROUTINE TESTING W REFLEX): HIV: NONREACTIVE

## 2011-08-02 ENCOUNTER — Emergency Department (HOSPITAL_COMMUNITY)
Admission: EM | Admit: 2011-08-02 | Discharge: 2011-08-02 | Disposition: A | Discharge status: Home / Self Care | Payer: Medicaid Other | Attending: Emergency Medicine | Admitting: Emergency Medicine

## 2011-08-02 DIAGNOSIS — F17200 Nicotine dependence, unspecified, uncomplicated: Secondary | ICD-10-CM | POA: Insufficient documentation

## 2011-08-02 DIAGNOSIS — O99891 Other specified diseases and conditions complicating pregnancy: Secondary | ICD-10-CM | POA: Insufficient documentation

## 2011-08-02 DIAGNOSIS — F410 Panic disorder [episodic paroxysmal anxiety] without agoraphobia: Secondary | ICD-10-CM | POA: Insufficient documentation

## 2011-08-02 DIAGNOSIS — R109 Unspecified abdominal pain: Secondary | ICD-10-CM | POA: Insufficient documentation

## 2011-08-02 LAB — BASIC METABOLIC PANEL
BUN: 7 mg/dL (ref 6–23)
CO2: 26 mEq/L (ref 19–32)
Calcium: 10 mg/dL (ref 8.4–10.5)
Chloride: 100 mEq/L (ref 96–112)
Creatinine, Ser: 0.52 mg/dL (ref 0.50–1.10)
GFR calc Af Amer: >60 mL/min (ref >60)
GFR calc non Af Amer: >60 mL/min (ref >60)
Glucose, Bld: 103 mg/dL — ABNORMAL HIGH (ref 70–99)
Potassium: 3.7 mEq/L (ref 3.5–5.1)
Sodium: 135 mEq/L (ref 135–145)

## 2011-08-02 LAB — DIFFERENTIAL
Basophils Absolute: 0.1 10*3/uL (ref 0.0–0.1)
Basophils Relative: 0 % (ref 0–1)
Eosinophils Absolute: 0.2 10*3/uL (ref 0.0–0.7)
Eosinophils Relative: 2 % (ref 0–5)
Lymphocytes Relative: 19 % (ref 12–46)
Lymphs Abs: 2.3 10*3/uL (ref 0.7–4.0)
Monocytes Absolute: 0.6 10*3/uL (ref 0.1–1.0)
Monocytes Relative: 5 % (ref 3–12)
Neutro Abs: 9.2 10*3/uL — ABNORMAL HIGH (ref 1.7–7.7)
Neutrophils Relative %: 75 % (ref 43–77)

## 2011-08-02 LAB — CBC
HCT: 38.8 % (ref 36.0–46.0)
Hemoglobin: 13.4 g/dL (ref 12.0–15.0)
MCH: 28.9 pg (ref 26.0–34.0)
MCHC: 34.5 g/dL (ref 30.0–36.0)
MCV: 83.6 fL (ref 78.0–100.0)
Platelets: 333 10*3/uL (ref 150–400)
RBC: 4.64 MIL/uL (ref 3.87–5.11)
RDW: 14.1 % (ref 11.5–15.5)
WBC: 12.3 10*3/uL — ABNORMAL HIGH (ref 4.0–10.5)

## 2011-08-02 LAB — URINALYSIS, ROUTINE W REFLEX MICROSCOPIC
Bilirubin Urine: NEGATIVE
Glucose, UA: NEGATIVE mg/dL
Ketones, ur: NEGATIVE mg/dL
Leukocytes, UA: NEGATIVE
Nitrite: NEGATIVE
Protein, ur: NEGATIVE mg/dL
Specific Gravity, Urine: 1.013 (ref 1.005–1.030)
Urobilinogen, UA: 0.2 mg/dL (ref 0.0–1.0)
pH: 5.5 (ref 5.0–8.0)

## 2011-08-02 LAB — POCT PREGNANCY, URINE
Preg Test, Ur: POSITIVE
Preg Test, Ur: POSITIVE

## 2011-08-02 LAB — URINE MICROSCOPIC-ADD ON

## 2011-08-07 ENCOUNTER — Encounter (HOSPITAL_COMMUNITY): Payer: Self-pay | Admitting: *Deleted

## 2011-08-07 ENCOUNTER — Inpatient Hospital Stay (HOSPITAL_COMMUNITY)
Admission: AD | Admit: 2011-08-07 | Discharge: 2011-08-07 | Disposition: A | Discharge status: Home / Self Care | Payer: Medicaid Other | Source: Ambulatory Visit | Attending: Obstetrics and Gynecology | Admitting: Obstetrics and Gynecology

## 2011-08-07 DIAGNOSIS — O99891 Other specified diseases and conditions complicating pregnancy: Secondary | ICD-10-CM | POA: Insufficient documentation

## 2011-08-07 DIAGNOSIS — O26899 Other specified pregnancy related conditions, unspecified trimester: Secondary | ICD-10-CM

## 2011-08-07 DIAGNOSIS — R51 Headache: Secondary | ICD-10-CM | POA: Insufficient documentation

## 2011-08-07 DIAGNOSIS — F411 Generalized anxiety disorder: Secondary | ICD-10-CM | POA: Insufficient documentation

## 2011-08-07 DIAGNOSIS — O99340 Other mental disorders complicating pregnancy, unspecified trimester: Secondary | ICD-10-CM | POA: Insufficient documentation

## 2011-08-07 DIAGNOSIS — R519 Headache, unspecified: Secondary | ICD-10-CM

## 2011-08-07 LAB — URINALYSIS, ROUTINE W REFLEX MICROSCOPIC
Bilirubin Urine: NEGATIVE
Glucose, UA: NEGATIVE mg/dL
Ketones, ur: 15 mg/dL — AB
Leukocytes, UA: NEGATIVE
Nitrite: NEGATIVE
Protein, ur: 30 mg/dL — AB
Specific Gravity, Urine: 1.02 (ref 1.005–1.030)
Urobilinogen, UA: 1 mg/dL (ref 0.0–1.0)
pH: 7.5 (ref 5.0–8.0)

## 2011-08-07 LAB — URINE MICROSCOPIC-ADD ON

## 2011-08-07 MED ORDER — BUTORPHANOL TARTRATE 2 MG/ML IJ SOLN
2.0000 mg | Freq: Once | INTRAMUSCULAR | Status: AC
  Administered 2011-08-07: 2 mg via INTRAVENOUS
  Filled 2011-08-07: qty 1

## 2011-08-07 MED ORDER — LACTATED RINGERS IV SOLN
INTRAVENOUS | Status: DC
  Administered 2011-08-07: 21:00:00 via INTRAVENOUS

## 2011-08-07 MED ORDER — ONDANSETRON HCL 4 MG/2ML IJ SOLN
4.0000 mg | Freq: Once | INTRAMUSCULAR | Status: AC
  Administered 2011-08-07: 4 mg via INTRAVENOUS
  Filled 2011-08-07: qty 2

## 2011-08-07 NOTE — Progress Notes (Signed)
Pt G3 P2 at 9.1wks having migraine, nausea and vomiting.

## 2011-08-07 NOTE — ED Provider Notes (Signed)
History   Penny [name] is a 30y.o. Black female who presents via EMS at 9 weeks secondary to unrelieved Headache.  Pt called late afternoon with complaint and had tried Tylenol with no relief.  Instructed pt to go in cold, dark, silent room (child crying in background on phone) and take Phenergan tablet and try cold pack to head and try to sleep it off.  Pt called back about an hr later and not improving and stated she needed to come in, but needed to find "transportation." Offered to call pt in Tylenol #3 to drugstore and stated she didn't have "$3 copay" to be able to pick it up.  Pt reported to nurses she had been at "[place]" yesterday and eaten "[place] cheese-steak."  Pt reports h/o migraines and rx'd "immitrex" in past by MD in [place].  When rev'd pt's h/o, has been seen in MAU and/or [facility] ED each week of September and freq visits over past 2 months.  Pt has 9y.o. Son and daughter at home.  Hasn't had NOB at office yet, but scheduled for 08/16/11.  Treated for vulvar irritation 9/9 & anxiety at [facility] 9/18 with benadryl.  Pt has had N/V today.  Reports adequate hydration.   No other GI complaints or resp c/o's; no VB or abd pain.   Chief Complaint  Patient presents with  . Migraine  . Emesis During Pregnancy   HPI  OB History    Grav Para Term Preterm Abortions TAB SAB Ect Mult Living   3 2 1 1  0 0 0 0 0 2      Past Medical History  Diagnosis Date  . Preterm labor   . Anxiety   . Depression   . Migraines     Past Surgical History  Procedure Date  . Cesarean section     No family history on file.  History  Substance Use Topics  . Smoking status: Current Everyday Smoker -- 0.2 packs/day for 12 years    Types: Cigarettes  . Smokeless tobacco: Never Used  . Alcohol Use: No     PReviously drank on occ    Allergies: No Known Allergies  Prescriptions prior to admission  Medication Sig Dispense Refill  . acetaminophen (TYLENOL) 500 MG tablet Take 1,000 mg by mouth  daily as needed. For headache and/ or pain      . Pediatric Multivit-Minerals-C (FLINTSTONES GUMMIES) chewable tablet Chew 4 tablets by mouth daily.       Marland Kitchen PRESCRIPTION MEDICATION Take 1 tablet by mouth 2 (two) times daily. Medication for Bacterial vaginosis       . promethazine (PHENERGAN) 25 MG tablet Take 1 tablet (25 mg total) by mouth every 6 (six) hours as needed for nausea.  20 tablet  0    ROS  See HPI Physical Exam   Blood pressure 131/79, pulse 81, temperature 97.7 F (36.5 C), temperature source Oral, resp. rate 16, height 5\' 3"  (1.6 m), weight 76.476 kg (168 lb 9.6 oz), last menstrual period 06/05/2011.  Physical Exam  Constitutional: She is oriented to person, place, and time. She appears well-developed and well-nourished. No distress.       Lights and sounds not bothering pt while in MAU.  Continued talking on cell phone despite pain complaints, and frequently asked RN for "food," even before she was able to get pain medicine in IV  Cardiovascular: Normal rate and regular rhythm.   Respiratory: Effort normal and breath sounds normal.  GI: Soft. Bowel  sounds are normal.  Genitourinary:       Deferred; RN attempted to get FHT on abdomen, but unable  Neurological: She is alert and oriented to person, place, and time.  Skin: Skin is warm and dry. She is not diaphoretic.    MAU Course  Procedures 1.  1 Liter LR 2.  Stadol 2mg  IVx1--which put pt to sleep and she stated HA resolved 3.  After eating numerous crackers, pt threw up and given Zofran 4mg  IV x1   Assessment and Plan  1.  IUP at 9 weeks 2.  Headache--improved with interventions 3.  Psychosocial issues/lack of transportation and poor use of resources/frequent visits to ED 4.  Anxiety/depression  1.  D/c home to continue tylenol and phenergan every 6 hrs for next 24 hrs 2.  Will have office refer pt to Headache wellness center ASAP 3.  Pt to follow-up 08/16/11 or prn Chala Gul H 08/07/2011, 10:13 PM

## 2011-09-15 ENCOUNTER — Inpatient Hospital Stay (HOSPITAL_COMMUNITY)
Admission: AD | Admit: 2011-09-15 | Discharge: 2011-09-16 | Disposition: A | Discharge status: Home / Self Care | Payer: Medicaid Other | Source: Ambulatory Visit | Attending: Obstetrics and Gynecology | Admitting: Obstetrics and Gynecology

## 2011-09-15 ENCOUNTER — Encounter (HOSPITAL_COMMUNITY): Payer: Self-pay | Admitting: *Deleted

## 2011-09-15 DIAGNOSIS — R109 Unspecified abdominal pain: Secondary | ICD-10-CM | POA: Insufficient documentation

## 2011-09-15 DIAGNOSIS — O2390 Unspecified genitourinary tract infection in pregnancy, unspecified trimester: Secondary | ICD-10-CM | POA: Insufficient documentation

## 2011-09-15 DIAGNOSIS — O26899 Other specified pregnancy related conditions, unspecified trimester: Secondary | ICD-10-CM

## 2011-09-15 DIAGNOSIS — B9689 Other specified bacterial agents as the cause of diseases classified elsewhere: Secondary | ICD-10-CM | POA: Insufficient documentation

## 2011-09-15 DIAGNOSIS — N760 Acute vaginitis: Secondary | ICD-10-CM | POA: Insufficient documentation

## 2011-09-15 DIAGNOSIS — A499 Bacterial infection, unspecified: Secondary | ICD-10-CM | POA: Insufficient documentation

## 2011-09-15 MED ORDER — IBUPROFEN 600 MG PO TABS
600.0000 mg | ORAL_TABLET | Freq: Once | ORAL | Status: AC
  Administered 2011-09-16: 600 mg via ORAL
  Filled 2011-09-15: qty 1

## 2011-09-15 MED ORDER — SIMETHICONE 80 MG PO CHEW
80.0000 mg | CHEWABLE_TABLET | Freq: Once | ORAL | Status: AC
  Administered 2011-09-16: 80 mg via ORAL
  Filled 2011-09-15: qty 1

## 2011-09-15 NOTE — Progress Notes (Signed)
Pt  States she is having stomach cramping, that started 1800 tonight. Pt states pain originates in her lower abdomen and goes around back and down right leg

## 2011-09-15 NOTE — Progress Notes (Signed)
PT CAME IN WITH OTHER 2 CHILDREN . SAYS  HAS LOWER CRAMPS AND LOWER BACK AND DOWN R LEG- ALL STARTED AT 6PM.   SHE CALLED OFFICE - TOLD HER TO TAKE IBUPROFEN- DIDN'T HAVE - SO TOOK ADVIL- DRANK  32 OZ WATER-   AND SHOWER- WITH NO RELIEF- SO SHE CAME IN. DENIES HSV AND MRSA.  WAS IN OFFICE ON Tuesday-  BABY HAS GASTRO- PROBLEMS AND  LOW PLACENTA.  NEXT APPOINTMENT - 09-28-2011

## 2011-09-16 ENCOUNTER — Encounter (HOSPITAL_COMMUNITY): Payer: Self-pay | Admitting: Obstetrics and Gynecology

## 2011-09-16 LAB — GC/CHLAMYDIA PROBE AMP, GENITAL
Chlamydia, DNA Probe: NEGATIVE
GC Probe Amp, Genital: NEGATIVE

## 2011-09-16 LAB — CBC
HCT: 37.8 % (ref 36.0–46.0)
Hemoglobin: 12.7 g/dL (ref 12.0–15.0)
MCH: 28.2 pg (ref 26.0–34.0)
MCHC: 33.6 g/dL (ref 30.0–36.0)
MCV: 83.8 fL (ref 78.0–100.0)
Platelets: 309 10*3/uL (ref 150–400)
RBC: 4.51 MIL/uL (ref 3.87–5.11)
RDW: 13.8 % (ref 11.5–15.5)
WBC: 13.9 10*3/uL — ABNORMAL HIGH (ref 4.0–10.5)

## 2011-09-16 LAB — DIFFERENTIAL
Basophils Absolute: 0 10*3/uL (ref 0.0–0.1)
Basophils Relative: 0 % (ref 0–1)
Eosinophils Absolute: 0.2 10*3/uL (ref 0.0–0.7)
Eosinophils Relative: 1 % (ref 0–5)
Lymphocytes Relative: 16 % (ref 12–46)
Lymphs Abs: 2.3 10*3/uL (ref 0.7–4.0)
Monocytes Absolute: 0.8 10*3/uL (ref 0.1–1.0)
Monocytes Relative: 6 % (ref 3–12)
Neutro Abs: 10.7 10*3/uL — ABNORMAL HIGH (ref 1.7–7.7)
Neutrophils Relative %: 77 % (ref 43–77)

## 2011-09-16 LAB — WET PREP, GENITAL
Trich, Wet Prep: NONE SEEN
Yeast Wet Prep HPF POC: NONE SEEN

## 2011-09-16 LAB — COMPREHENSIVE METABOLIC PANEL
ALT: 8 U/L (ref 0–35)
AST: 11 U/L (ref 0–37)
Albumin: 3.2 g/dL — ABNORMAL LOW (ref 3.5–5.2)
Alkaline Phosphatase: 72 U/L (ref 39–117)
BUN: 6 mg/dL (ref 6–23)
CO2: 25 mEq/L (ref 19–32)
Calcium: 9.3 mg/dL (ref 8.4–10.5)
Chloride: 101 mEq/L (ref 96–112)
Creatinine, Ser: 0.57 mg/dL (ref 0.50–1.10)
GFR calc Af Amer: >90 mL/min (ref >90)
GFR calc non Af Amer: >90 mL/min (ref >90)
Glucose, Bld: 81 mg/dL (ref 70–99)
Potassium: 4.1 mEq/L (ref 3.5–5.1)
Sodium: 134 mEq/L — ABNORMAL LOW (ref 135–145)
Total Bilirubin: 0.2 mg/dL — ABNORMAL LOW (ref 0.3–1.2)
Total Protein: 7 g/dL (ref 6.0–8.3)

## 2011-09-16 LAB — URINALYSIS, ROUTINE W REFLEX MICROSCOPIC
Bilirubin Urine: NEGATIVE
Glucose, UA: NEGATIVE mg/dL
Ketones, ur: 15 mg/dL — AB
Leukocytes, UA: NEGATIVE
Nitrite: NEGATIVE
Protein, ur: NEGATIVE mg/dL
Specific Gravity, Urine: 1.025 (ref 1.005–1.030)
Urobilinogen, UA: 0.2 mg/dL (ref 0.0–1.0)
pH: 6 (ref 5.0–8.0)

## 2011-09-16 LAB — URINE MICROSCOPIC-ADD ON

## 2011-09-16 MED ORDER — METRONIDAZOLE 500 MG PO TABS: 500.0000 mg | ORAL_TABLET | Freq: Two times a day (BID) | ORAL | Status: AC

## 2011-09-16 MED ORDER — IBUPROFEN 600 MG PO TABS: 600.0000 mg | ORAL_TABLET | Freq: Four times a day (QID) | ORAL | Status: DC

## 2011-09-16 NOTE — ED Provider Notes (Signed)
Attestation of Attending Supervision of Advanced Practitioner: Evaluation and management procedures were performed by the PA/NP/CNM/OB Fellow under my supervision/collaboration. Chart reviewed and agree with management and plan.  Livie Vanderhoof V 09/16/2011 9:01 PM

## 2011-09-16 NOTE — ED Provider Notes (Signed)
History     Chief Complaint  Patient presents with  . Abdominal Pain   Abdominal Pain This is a new problem. The current episode started today. The onset quality is sudden. The problem occurs constantly. The problem has been gradually worsening. The pain is located in the RLQ. The pain is at a severity of 9/10. The pain is severe. The quality of the pain is aching, cramping and sharp. The abdominal pain radiates to the right flank. Pertinent negatives include no anorexia, arthralgias, belching, constipation, diarrhea, dysuria, fever, flatus, frequency, headaches, hematochezia, hematuria, melena, myalgias, nausea, vomiting or weight loss. It is movement what aggravates the pain. The pain is relieved by nothing. Treatments tried: Flexeril. The treatment provided no relief. There is no history of abdominal surgery, colon cancer, Crohn's disease, gallstones, GERD, irritable bowel syndrome, pancreatitis, PUD or ulcerative colitis.   Pt is a 30yo G3 P1,1,0,2 who presents with c/o constant sharp abdominal pain since 6:00pm.  She reports the pain initially was alternating between rt and lt sides as well as midline with radiation into her back.  Now the pain is more localized to the RLQ.  She denies fever, nausea, vomiting or diarrhea.  She denies constipation or increased gas.  Pt denies ROM or bldg.  She tried treating the pain with flexeril (which she had on hand), increased po fluids and a warm bath with no relief.  She ranks the pain as 9/10.  She denies UTI s/s.       OB History    Grav Para Term Preterm Abortions TAB SAB Ect Mult Living   3 2 1 1  0 0 0 0 0 2      Past Medical History  Diagnosis Date  . Preterm labor   . Anxiety   . Depression   . Migraines     Past Surgical History  Procedure Date  . Cesarean section     Family History  Problem Relation Age of Onset  . Cancer Mother   . Hypertension Sister   . Sickle cell trait Daughter   . Asthma Son   . Asthma Paternal Grandmother      History  Substance Use Topics  . Smoking status: Current Everyday Smoker -- 0.2 packs/day for 12 years    Types: Cigarettes  . Smokeless tobacco: Never Used  . Alcohol Use: No     PReviously drank on occ    Allergies: No Known Allergies  Prescriptions prior to admission  Medication Sig Dispense Refill  . acetaminophen (TYLENOL) 500 MG tablet Take 1,000 mg by mouth daily as needed. For headache and/ or pain      . cyclobenzaprine (FLEXERIL) 5 MG tablet Take 5 mg by mouth 3 (three) times daily as needed. For spasms       . Pediatric Multivit-Minerals-C (FLINTSTONES GUMMIES) chewable tablet Chew 4 tablets by mouth daily.         Review of Systems  Constitutional: Negative.  Negative for fever and weight loss.  HENT: Negative.   Eyes: Negative.   Respiratory: Negative.   Cardiovascular: Negative.   Gastrointestinal: Positive for abdominal pain. Negative for heartburn, nausea, vomiting, diarrhea, constipation, blood in stool, melena, hematochezia, anorexia and flatus.  Genitourinary: Negative.  Negative for dysuria, frequency and hematuria.  Musculoskeletal: Negative.  Negative for myalgias and arthralgias.  Skin: Negative.   Neurological: Negative.  Negative for headaches.  Endo/Heme/Allergies: Negative.   Psychiatric/Behavioral: Negative.    Physical Exam   Blood pressure 111/62, pulse 104, temperature  99.1 F (37.3 C), temperature source Oral, resp. rate 20, height 5\' 3"  (1.6 m), weight 78.245 kg (172 lb 8 oz), last menstrual period 06/05/2011.  Physical Exam  Constitutional: She is oriented to person, place, and time. She appears well-developed and well-nourished.  HENT:  Head: Normocephalic and atraumatic.  Right Ear: External ear normal.  Left Ear: External ear normal.  Eyes: Conjunctivae are normal. Pupils are equal, round, and reactive to light.  Neck: Normal range of motion. Neck supple. No thyromegaly present.  Cardiovascular: Normal rate, regular rhythm,  normal heart sounds and intact distal pulses.   Respiratory: Effort normal and breath sounds normal.  GI: Soft. Bowel sounds are normal. She exhibits no distension and no mass. There is tenderness. There is no rebound and no guarding.  Genitourinary: Vagina normal and uterus normal.       Vagina with sm amt white d/c in vault.  Normal rugae. Cx without lesions or discharge, visually closed. Neg CMT.  Uterus mobile, NT and 14wk size.   Adnexa without masses but mild tenderness bilaterally.    Musculoskeletal: Normal range of motion.  Neurological: She is alert and oriented to person, place, and time. She has normal reflexes.  Skin: Skin is warm and dry.  Psychiatric: She has a normal mood and affect. Her behavior is normal. Judgment and thought content normal.   FHTs 150 with doppler.  MAU Course  Procedures Results for orders placed during the hospital encounter of 09/15/11 (from the past 24 hour(s))  URINALYSIS, ROUTINE W REFLEX MICROSCOPIC     Status: Abnormal   Collection Time   09/15/11 11:04 PM      Component Value Range   Color, Urine YELLOW  YELLOW    Appearance CLEAR  CLEAR    Specific Gravity, Urine 1.025  1.005 - 1.030    pH 6.0  5.0 - 8.0    Glucose, UA NEGATIVE  NEGATIVE (mg/dL)   Hgb urine dipstick TRACE (*) NEGATIVE    Bilirubin Urine NEGATIVE  NEGATIVE    Ketones, ur 15 (*) NEGATIVE (mg/dL)   Protein, ur NEGATIVE  NEGATIVE (mg/dL)   Urobilinogen, UA 0.2  0.0 - 1.0 (mg/dL)   Nitrite NEGATIVE  NEGATIVE    Leukocytes, UA NEGATIVE  NEGATIVE   URINE MICROSCOPIC-ADD ON     Status: Abnormal   Collection Time   09/15/11 11:04 PM      Component Value Range   Squamous Epithelial / LPF FEW (*) RARE    WBC, UA 0-2  <3 (WBC/hpf)   Urine-Other MUCOUS PRESENT    WET PREP, GENITAL     Status: Abnormal   Collection Time   09/15/11 11:50 PM      Component Value Range   Yeast, Wet Prep NONE SEEN  NONE SEEN    Trich, Wet Prep NONE SEEN  NONE SEEN    Clue Cells, Wet Prep FEW (*)  NONE SEEN    WBC, Wet Prep HPF POC FEW (*) NONE SEEN   CBC     Status: Abnormal   Collection Time   09/16/11 12:10 AM      Component Value Range   WBC 13.9 (*) 4.0 - 10.5 (K/uL)   RBC 4.51  3.87 - 5.11 (MIL/uL)   Hemoglobin 12.7  12.0 - 15.0 (g/dL)   HCT 16.1  09.6 - 04.5 (%)   MCV 83.8  78.0 - 100.0 (fL)   MCH 28.2  26.0 - 34.0 (pg)   MCHC 33.6  30.0 - 36.0 (  g/dL)   RDW 16.1  09.6 - 04.5 (%)   Platelets 309  150 - 400 (K/uL)  DIFFERENTIAL     Status: Abnormal   Collection Time   09/16/11 12:10 AM      Component Value Range   Neutrophils Relative 77  43 - 77 (%)   Neutro Abs 10.7 (*) 1.7 - 7.7 (K/uL)   Lymphocytes Relative 16  12 - 46 (%)   Lymphs Abs 2.3  0.7 - 4.0 (K/uL)   Monocytes Relative 6  3 - 12 (%)   Monocytes Absolute 0.8  0.1 - 1.0 (K/uL)   Eosinophils Relative 1  0 - 5 (%)   Eosinophils Absolute 0.2  0.0 - 0.7 (K/uL)   Basophils Relative 0  0 - 1 (%)   Basophils Absolute 0.0  0.0 - 0.1 (K/uL)  COMPREHENSIVE METABOLIC PANEL     Status: Abnormal   Collection Time   09/16/11 12:10 AM      Component Value Range   Sodium 134 (*) 135 - 145 (mEq/L)   Potassium 4.1  3.5 - 5.1 (mEq/L)   Chloride 101  96 - 112 (mEq/L)   CO2 25  19 - 32 (mEq/L)   Glucose, Bld 81  70 - 99 (mg/dL)   BUN 6  6 - 23 (mg/dL)   Creatinine, Ser 4.09  0.50 - 1.10 (mg/dL)   Calcium 9.3  8.4 - 81.1 (mg/dL)   Total Protein 7.0  6.0 - 8.3 (g/dL)   Albumin 3.2 (*) 3.5 - 5.2 (g/dL)   AST 11  0 - 37 (U/L)   ALT 8  0 - 35 (U/L)   Alkaline Phosphatase 72  39 - 117 (U/L)   Total Bilirubin 0.2 (*) 0.3 - 1.2 (mg/dL)   GFR calc non Af Amer >90  >90 (mL/min)   GFR calc Af Amer >90  >90 (mL/min)   Assessment and Plan  IUP at 14w 4d Abdominal pain Bacterial vaginosis  Motrin and Mylicon given and pain decreased to 5/10. Consult with Dr. Su Hilt. Will discharge to home.   Warning s/s d/w pt. Rx Metronidazole 500mg , #14, no rf, 1 tab po bid x 7 days and Motrin 600mg , #30, no rf, 1 tab po q6hrs x next  24hrs.  Rec OTC Mylicon for gas. F/U at [facility] early next week-pt to call for appt.  Lavarius Doughten O. 09/16/2011, 12:17 AM

## 2011-09-20 ENCOUNTER — Ambulatory Visit: Payer: Medicaid Other | Admitting: Physical Therapy

## 2011-09-26 ENCOUNTER — Encounter (HOSPITAL_COMMUNITY): Payer: Self-pay | Admitting: *Deleted

## 2011-09-26 ENCOUNTER — Inpatient Hospital Stay (HOSPITAL_COMMUNITY)
Admission: AD | Admit: 2011-09-26 | Discharge: 2011-09-26 | Disposition: A | Discharge status: Home / Self Care | Payer: Medicaid Other | Source: Ambulatory Visit | Attending: Obstetrics and Gynecology | Admitting: Obstetrics and Gynecology

## 2011-09-26 DIAGNOSIS — O99891 Other specified diseases and conditions complicating pregnancy: Secondary | ICD-10-CM | POA: Insufficient documentation

## 2011-09-26 DIAGNOSIS — O358XX0 Maternal care for other (suspected) fetal abnormality and damage, not applicable or unspecified: Secondary | ICD-10-CM

## 2011-09-26 DIAGNOSIS — R000 Tachycardia, unspecified: Secondary | ICD-10-CM

## 2011-09-26 LAB — URINALYSIS, ROUTINE W REFLEX MICROSCOPIC
Bilirubin Urine: NEGATIVE
Glucose, UA: NEGATIVE mg/dL
Ketones, ur: NEGATIVE mg/dL
Nitrite: NEGATIVE
Protein, ur: NEGATIVE mg/dL
Specific Gravity, Urine: >1.03 — ABNORMAL HIGH (ref 1.005–1.030)
Urobilinogen, UA: 0.2 mg/dL (ref 0.0–1.0)
pH: 6 (ref 5.0–8.0)

## 2011-09-26 LAB — URINE MICROSCOPIC-ADD ON

## 2011-09-26 NOTE — Progress Notes (Signed)
Pt brought in by EMS>. Pt c/o sob and heart palpitations about x2 hours ago.

## 2011-09-26 NOTE — ED Provider Notes (Signed)
History   30 yo G3P1102 at 17 1/7 weeks presented via EMS for episode of fast heart beat and SOB earlier today--episode now resolved.  Patient reports this is "different" from her previous experience of anxiety attacks.  With this episode, her heart rate increased without any precipitating factor causing anxiety, then her breathing became more labored.  Denies chest pain, hemoptysis, viral syndrome, recent medication, fever--does report occasional slight cough and mild nasal congestion.  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 Tachycardia     OB History    Grav Para Term Preterm Abortions TAB SAB Ect Mult Living   3 2 1 1  0 0 0 0 0 2      Past Medical History  Diagnosis Date  . Preterm labor   . Anxiety   . Depression   . Migraines     Past Surgical History  Procedure Date  . Cesarean section     Family History  Problem Relation Age of Onset  . Cancer Mother   . Hypertension Sister   . Sickle cell trait Daughter   . Asthma Son   . Asthma Paternal Grandmother     History  Substance Use Topics  . Smoking status: Current Everyday Smoker -- 0.2 packs/day for 12 years    Types: Cigarettes  . Smokeless tobacco: Never Used  . Alcohol Use: No     PReviously drank on occ    Allergies: No Known Allergies  Prescriptions prior to admission  Medication Sig Dispense Refill  . ibuprofen (ADVIL,MOTRIN) 600 MG tablet Take 600 mg by mouth every 6 (six) hours as needed. Pain        . metroNIDAZOLE (FLAGYL) 500 MG tablet Take 1 tablet (500 mg total) by mouth 2 (two) times daily.  14 tablet  0  . Pediatric Multivit-Minerals-C (FLINTSTONES GUMMIES) chewable tablet Chew 4 tablets by mouth daily.       Marland Kitchen DISCONTD: ibuprofen (ADVIL,MOTRIN) 600 MG tablet Take 1 tablet (600 mg total) by mouth  every 6 (six) hours.  30 tablet  0     Physical Exam   Blood pressure 117/66, pulse 66, temperature 98.2 F (36.8 C), temperature source Oral, resp. rate 20, height 5\' 3"  (1.6 m), weight 171 lb (77.565 kg), last menstrual period 06/05/2011, SpO2 96.00%.  Appears in NAD--O2 sat 96-99% on RA. Chest clear Heart RRR without murmur  Abd soft, NT, gravid Ext WNL Ears generally clear, slight cerumen Throat clear Nasal turbinates clear  No lymphadanopathy FHR 149 per doppler EKG via EMS WNL   Results for orders placed during the hospital encounter of 09/26/11 (from the past 24 hour(s))  URINALYSIS, ROUTINE W REFLEX MICROSCOPIC     Status: Abnormal   Collection Time   09/26/11  1:53 PM      Component Value Range   Color, Urine YELLOW  YELLOW    Appearance HAZY (*) CLEAR    Specific Gravity, Urine >1.030 (*) 1.005 - 1.030    pH 6.0  5.0 - 8.0    Glucose, UA NEGATIVE  NEGATIVE (mg/dL)   Hgb urine dipstick TRACE (*) NEGATIVE    Bilirubin Urine NEGATIVE  NEGATIVE    Ketones, ur NEGATIVE  NEGATIVE (mg/dL)   Protein, ur NEGATIVE  NEGATIVE (mg/dL)   Urobilinogen, UA 0.2  0.0 - 1.0 (mg/dL)  Nitrite NEGATIVE  NEGATIVE    Leukocytes, UA MODERATE (*) NEGATIVE   URINE MICROSCOPIC-ADD ON     Status: Abnormal   Collection Time   09/26/11  1:53 PM      Component Value Range   Squamous Epithelial / LPF MANY (*) RARE    WBC, UA 3-6  <3 (WBC/hpf)   Bacteria, UA MANY (*) RARE      ED Course  IUP at 16 1/7 weeks Episode of maternal tachycardia, now resolved Hx anxiety issues  Plan: D/C home per consult with Dr. Normand Sloop Push po fluids Office will arrange cardiology consult for patient. Patient will keep office appointment for Wednesday with Dr. Normand Sloop.  Nigel Bridgeman, CNM 09/26/11 1635

## 2011-09-26 NOTE — Progress Notes (Signed)
TAXI VOUCHER GIVEN. BLUE BIRD TAXI ON ROUTE.

## 2011-09-26 NOTE — Progress Notes (Signed)
Pt c/o sob, palitations that started x2 hours ago. V/s wnl. CNM will see in MAU.

## 2011-09-28 ENCOUNTER — Other Ambulatory Visit (HOSPITAL_COMMUNITY): Payer: Self-pay | Admitting: Obstetrics and Gynecology

## 2011-09-28 DIAGNOSIS — O359XX0 Maternal care for (suspected) fetal abnormality and damage, unspecified, not applicable or unspecified: Secondary | ICD-10-CM

## 2011-09-29 ENCOUNTER — Other Ambulatory Visit (HOSPITAL_COMMUNITY): Payer: Medicaid Other

## 2011-09-30 ENCOUNTER — Ambulatory Visit (HOSPITAL_COMMUNITY)
Admission: RE | Admit: 2011-09-30 | Discharge: 2011-09-30 | Disposition: A | Discharge status: Home / Self Care | Payer: Medicaid Other | Source: Ambulatory Visit | Attending: Obstetrics and Gynecology | Admitting: Obstetrics and Gynecology

## 2011-09-30 ENCOUNTER — Other Ambulatory Visit (HOSPITAL_COMMUNITY): Payer: Self-pay | Admitting: Obstetrics and Gynecology

## 2011-09-30 VITALS — BP 131/69 | HR 86 | Wt 171.0 lb

## 2011-09-30 DIAGNOSIS — O35DXX0 Maternal care for other (suspected) fetal abnormality and damage, fetal gastrointestinal anomalies, not applicable or unspecified: Secondary | ICD-10-CM | POA: Insufficient documentation

## 2011-09-30 DIAGNOSIS — O358XX0 Maternal care for other (suspected) fetal abnormality and damage, not applicable or unspecified: Secondary | ICD-10-CM | POA: Insufficient documentation

## 2011-09-30 DIAGNOSIS — O99330 Smoking (tobacco) complicating pregnancy, unspecified trimester: Secondary | ICD-10-CM | POA: Insufficient documentation

## 2011-09-30 DIAGNOSIS — O359XX0 Maternal care for (suspected) fetal abnormality and damage, unspecified, not applicable or unspecified: Secondary | ICD-10-CM

## 2011-09-30 DIAGNOSIS — O34219 Maternal care for unspecified type scar from previous cesarean delivery: Secondary | ICD-10-CM | POA: Insufficient documentation

## 2011-09-30 DIAGNOSIS — Z8751 Personal history of pre-term labor: Secondary | ICD-10-CM | POA: Insufficient documentation

## 2011-09-30 NOTE — Progress Notes (Signed)
Encounter addended by: Marlana Latus, RN on: 09/30/2011  4:50 PM<BR>     Documentation filed: Episodes, Chief Complaint Section

## 2011-09-30 NOTE — Progress Notes (Signed)
Penny [name] was seen for ultrasound appointment today.  Please see [facility] report for details.  

## 2011-10-03 NOTE — Progress Notes (Signed)
Genetic Counseling  High-Risk Gestation Note  Appointment Date:  09/30/2011 Referred By: Penny Litter, MD Date of Birth:  06/28/1981 Partner:  Penny [name]  Pregnancy History: [id number] Estimated Date of Delivery: 03/11/12 Estimated Gestational Age: [redacted]w[redacted]d Attending: Berna Spare, MD   [name] and her partner, [name], were seen for consultation for genetic counseling because of the previous ultrasound finding of gastroschisis.   Ultrasound performed at the time of today's visit confirmed the finding of gastroschisis. No additional anomalies were identified, but gestational age is too early for detailed fetal anatomic survey. The patient is planning to return on 10/14/11 for detailed fetal anatomic survey. We discussed that gastroschisis occurs in approximately 1 in 5,000-10,000 live births, more often in babies born to younger mothers who smoke during pregnancy.  We reviewed that gastroschisis is considered an abdominal wall defect which is characterized by an opening to the right of the umbilicus through which the intestines and other visceral organs protrude.  This opening usually arises from incomplete closure of the lateral folds during the sixth week of gestation.  We discussed that as a consequence of the intestinal herniation, the unprotected bowel may be damaged and not function well after delivery.  We discussed that gastroschisis is typically not associated with chromosomal anomalies or other structural malformations with the exception of intestinal atresia.  The majority of cases of gastroschisis are thought to be sporadic and multifactorial in etiology, with a low risk of recurrence.  However, there have been reports of both autosomal recessive and dominant inheritance.    We discussed that surgery to place the intestines into the baby's abdominal cavity is typically performed shortly after birth, to minimize the risk of infection.  In addition, we discussed that  prognosis is typically good, but depends on the size of the abdominal opening and whether or not damage has occurred to the exposed organs.    Since gastroschisis exposes the fetal intestines to the amniotic fluid during pregnancy, there is an increased risk for third trimester complications, such as bowel dilatation, decreased fetal growth and amniotic fluid volume, preterm delivery, and stillbirth.  For these reasons, we discussed the option of close surveillance during the third trimester.  We also discussed the option of meeting with a pediatric surgeon to discuss surgical correction and postnatal management and the option of a NICU tour to become familiar with the location and the care provided, which we are able to facilitate, if desired.   Both family histories were reviewed and found to be contributory for sickle cell trait in the father of the pregnancy and their daughter. [name] was provided with written information regarding sickle cell anemia (SCA) including the carrier frequency and incidence in the African American population, the availability of carrier testing and prenatal diagnosis if indicated. Medical records indicate that [name] has had negative sickle cell screening through her OB. Thus, the pregnancy would not be considered to be at increased risk for sickle cell disease but would have a 1 in 2 risk to inherit sickle cell trait.  In addition, we discussed that hemoglobinopathies are routinely screened for as part of the [place] newborn screening panel.  Without further information regarding the provided family history, an accurate genetic risk cannot be calculated. Further genetic counseling is warranted if more information is obtained.  [name] denied exposure to environmental toxins or chemical agents. She denied the use of alcohol, tobacco or street drugs. She denied significant viral illnesses during the course  of her pregnancy. Her medical and surgical histories were  noncontributory.   I counseled this couple for approximately 40 minutes regarding the above risks and available options.    Penny [name] Penny Issac, MS, [facility] 10/03/2011

## 2011-10-14 ENCOUNTER — Ambulatory Visit (HOSPITAL_COMMUNITY): Payer: Medicaid Other

## 2011-10-14 ENCOUNTER — Other Ambulatory Visit: Payer: Self-pay

## 2011-10-18 ENCOUNTER — Ambulatory Visit (HOSPITAL_COMMUNITY)
Admission: RE | Admit: 2011-10-18 | Discharge: 2011-10-18 | Disposition: A | Discharge status: Home / Self Care | Payer: Medicaid Other | Source: Ambulatory Visit | Attending: Obstetrics and Gynecology | Admitting: Obstetrics and Gynecology

## 2011-10-18 ENCOUNTER — Other Ambulatory Visit (HOSPITAL_COMMUNITY): Payer: Self-pay | Admitting: Maternal and Fetal Medicine

## 2011-10-18 DIAGNOSIS — O34219 Maternal care for unspecified type scar from previous cesarean delivery: Secondary | ICD-10-CM | POA: Insufficient documentation

## 2011-10-18 DIAGNOSIS — O99330 Smoking (tobacco) complicating pregnancy, unspecified trimester: Secondary | ICD-10-CM | POA: Insufficient documentation

## 2011-10-18 DIAGNOSIS — O358XX0 Maternal care for other (suspected) fetal abnormality and damage, not applicable or unspecified: Secondary | ICD-10-CM | POA: Insufficient documentation

## 2011-10-18 DIAGNOSIS — O359XX0 Maternal care for (suspected) fetal abnormality and damage, unspecified, not applicable or unspecified: Secondary | ICD-10-CM

## 2011-10-18 DIAGNOSIS — Z8751 Personal history of pre-term labor: Secondary | ICD-10-CM | POA: Insufficient documentation

## 2011-10-18 NOTE — Progress Notes (Signed)
Penny [name] was seen for ultrasound appointment today.  Please see [facility] report for details.  

## 2011-11-01 ENCOUNTER — Inpatient Hospital Stay (HOSPITAL_COMMUNITY)
Admission: AD | Admit: 2011-11-01 | Discharge: 2011-11-01 | Disposition: A | Discharge status: Home / Self Care | Payer: Medicaid Other | Source: Ambulatory Visit | Attending: Obstetrics and Gynecology | Admitting: Obstetrics and Gynecology

## 2011-11-01 ENCOUNTER — Encounter (HOSPITAL_COMMUNITY): Payer: Self-pay | Admitting: *Deleted

## 2011-11-01 DIAGNOSIS — B373 Candidiasis of vulva and vagina: Secondary | ICD-10-CM | POA: Insufficient documentation

## 2011-11-01 DIAGNOSIS — B3731 Acute candidiasis of vulva and vagina: Secondary | ICD-10-CM | POA: Insufficient documentation

## 2011-11-01 DIAGNOSIS — O2390 Unspecified genitourinary tract infection in pregnancy, unspecified trimester: Secondary | ICD-10-CM | POA: Insufficient documentation

## 2011-11-01 DIAGNOSIS — N899 Noninflammatory disorder of vagina, unspecified: Secondary | ICD-10-CM | POA: Insufficient documentation

## 2011-11-01 DIAGNOSIS — B379 Candidiasis, unspecified: Secondary | ICD-10-CM

## 2011-11-01 LAB — URINALYSIS, ROUTINE W REFLEX MICROSCOPIC
Bilirubin Urine: NEGATIVE
Glucose, UA: NEGATIVE mg/dL
Ketones, ur: NEGATIVE mg/dL
Nitrite: NEGATIVE
Protein, ur: 100 mg/dL — AB
Specific Gravity, Urine: >1.03 — ABNORMAL HIGH (ref 1.005–1.030)
Urobilinogen, UA: 0.2 mg/dL (ref 0.0–1.0)
pH: 6 (ref 5.0–8.0)

## 2011-11-01 LAB — URINE MICROSCOPIC-ADD ON

## 2011-11-01 LAB — WET PREP, GENITAL
Clue Cells Wet Prep HPF POC: NONE SEEN
Trich, Wet Prep: NONE SEEN

## 2011-11-01 MED ORDER — ACETAMINOPHEN 325 MG PO TABS
650.0000 mg | ORAL_TABLET | Freq: Once | ORAL | Status: AC
  Administered 2011-11-01: 650 mg via ORAL
  Filled 2011-11-01: qty 2

## 2011-11-01 MED ORDER — CLOTRIMAZOLE-BETAMETHASONE 1-0.05 % EX CREA: TOPICAL_CREAM | Freq: Two times a day (BID) | CUTANEOUS | Status: DC

## 2011-11-01 MED ORDER — TERCONAZOLE 0.4 % VA CREA: 1.0000 | TOPICAL_CREAM | Freq: Every day | VAGINAL | Status: AC

## 2011-11-01 NOTE — Progress Notes (Signed)
Vaginal irritation, yellow discharge, HA. States HA is dull, does not think it is a migraine. Has not taken anything for HA. States has Rx for flexeril, but has not taken.

## 2011-11-01 NOTE — Progress Notes (Addendum)
30 yo g3p2 [facility] 4/28 presents at 21 1/7 week IUP with c/o of vaginal discharge and irritation since Sunday, has headache today frontal, no hx of migraines, spot on R elbow without pain or itch x 1 week, no one else has this in the family, tx for BVV x 4 with this pg.  O VSS Well nurished black female in no distress,  EGBUS WNL cervix pink, closed, thick white/yellow discharge Wet prep, GC/CHL to lab Elbow L with 2 cm are pink excoriated no drainage or heat, nontender Fhts +  A 21 1/7 week IUP     Ring worm P Clotrimazole for ring worm, labs pending. Routine office f/o.  Lavera Guise, CNM  Addendum: +yeast, plan terazole 7, baking soda bathes, clotrimazole cream 1% to elbow BID. Discharge. Lavera Guise, CNM

## 2011-11-02 LAB — GC/CHLAMYDIA PROBE AMP, GENITAL
Chlamydia, DNA Probe: NEGATIVE
GC Probe Amp, Genital: NEGATIVE

## 2011-11-17 ENCOUNTER — Ambulatory Visit (HOSPITAL_COMMUNITY): Payer: Medicaid Other

## 2011-11-17 ENCOUNTER — Ambulatory Visit (HOSPITAL_COMMUNITY): Admission: RE | Admit: 2011-11-17 | Payer: Medicaid Other | Source: Ambulatory Visit

## 2011-11-23 ENCOUNTER — Encounter (HOSPITAL_COMMUNITY): Payer: Self-pay

## 2011-11-23 ENCOUNTER — Other Ambulatory Visit: Payer: Self-pay | Admitting: Obstetrics and Gynecology

## 2011-11-23 ENCOUNTER — Inpatient Hospital Stay (HOSPITAL_COMMUNITY)
Admission: AD | Admit: 2011-11-23 | Discharge: 2011-11-23 | Disposition: A | Discharge status: Home / Self Care | Payer: Medicaid Other | Source: Ambulatory Visit | Attending: Obstetrics and Gynecology | Admitting: Obstetrics and Gynecology

## 2011-11-23 DIAGNOSIS — O219 Vomiting of pregnancy, unspecified: Secondary | ICD-10-CM

## 2011-11-23 DIAGNOSIS — O212 Late vomiting of pregnancy: Secondary | ICD-10-CM | POA: Insufficient documentation

## 2011-11-23 LAB — URINALYSIS, ROUTINE W REFLEX MICROSCOPIC
Bilirubin Urine: NEGATIVE
Glucose, UA: NEGATIVE mg/dL
Ketones, ur: 15 mg/dL — AB
Leukocytes, UA: NEGATIVE
Nitrite: NEGATIVE
Protein, ur: NEGATIVE mg/dL
Specific Gravity, Urine: >1.03 — ABNORMAL HIGH (ref 1.005–1.030)
Urobilinogen, UA: 0.2 mg/dL (ref 0.0–1.0)
pH: 6 (ref 5.0–8.0)

## 2011-11-23 LAB — CBC
HCT: 35.9 % — ABNORMAL LOW (ref 36.0–46.0)
Hemoglobin: 12.1 g/dL (ref 12.0–15.0)
MCH: 28.5 pg (ref 26.0–34.0)
MCHC: 33.7 g/dL (ref 30.0–36.0)
MCV: 84.7 fL (ref 78.0–100.0)
Platelets: 280 10*3/uL (ref 150–400)
RBC: 4.24 MIL/uL (ref 3.87–5.11)
RDW: 14.5 % (ref 11.5–15.5)
WBC: 14 10*3/uL — ABNORMAL HIGH (ref 4.0–10.5)

## 2011-11-23 LAB — URINE MICROSCOPIC-ADD ON

## 2011-11-23 LAB — DIFFERENTIAL
Basophils Absolute: 0 10*3/uL (ref 0.0–0.1)
Basophils Relative: 0 % (ref 0–1)
Eosinophils Absolute: 0.1 10*3/uL (ref 0.0–0.7)
Eosinophils Relative: 1 % (ref 0–5)
Lymphocytes Relative: 17 % (ref 12–46)
Lymphs Abs: 2.4 10*3/uL (ref 0.7–4.0)
Monocytes Absolute: 0.7 10*3/uL (ref 0.1–1.0)
Monocytes Relative: 5 % (ref 3–12)
Neutro Abs: 10.7 10*3/uL — ABNORMAL HIGH (ref 1.7–7.7)
Neutrophils Relative %: 77 % (ref 43–77)

## 2011-11-23 LAB — COMPREHENSIVE METABOLIC PANEL
ALT: 8 U/L (ref 0–35)
AST: 12 U/L (ref 0–37)
Albumin: 3 g/dL — ABNORMAL LOW (ref 3.5–5.2)
Alkaline Phosphatase: 77 U/L (ref 39–117)
BUN: 6 mg/dL (ref 6–23)
CO2: 25 mEq/L (ref 19–32)
Calcium: 9.3 mg/dL (ref 8.4–10.5)
Chloride: 102 mEq/L (ref 96–112)
Creatinine, Ser: 0.48 mg/dL — ABNORMAL LOW (ref 0.50–1.10)
GFR calc Af Amer: >90 mL/min (ref >90)
GFR calc non Af Amer: >90 mL/min (ref >90)
Glucose, Bld: 106 mg/dL — ABNORMAL HIGH (ref 70–99)
Potassium: 3.6 mEq/L (ref 3.5–5.1)
Sodium: 136 mEq/L (ref 135–145)
Total Bilirubin: 0.2 mg/dL — ABNORMAL LOW (ref 0.3–1.2)
Total Protein: 7.1 g/dL (ref 6.0–8.3)

## 2011-11-23 LAB — AMYLASE: Amylase: 117 U/L — ABNORMAL HIGH (ref 0–105)

## 2011-11-23 LAB — LIPASE, BLOOD: Lipase: 61 U/L — ABNORMAL HIGH (ref 11–59)

## 2011-11-23 MED ORDER — LACTATED RINGERS IV BOLUS (SEPSIS)
500.0000 mL | Freq: Once | INTRAVENOUS | Status: AC
  Administered 2011-11-23: 500 mL via INTRAVENOUS

## 2011-11-23 MED ORDER — LACTATED RINGERS IV SOLN
INTRAVENOUS | Status: DC
  Administered 2011-11-23: 19:00:00 via INTRAVENOUS

## 2011-11-23 MED ORDER — DEXTROSE IN LACTATED RINGERS 5 % IV SOLN
INTRAVENOUS | Status: DC
  Administered 2011-11-23: 20:00:00 via INTRAVENOUS

## 2011-11-23 MED ORDER — ONDANSETRON 8 MG PO TBDP: 8.0000 mg | ORAL_TABLET | Freq: Three times a day (TID) | ORAL | Status: AC | PRN

## 2011-11-23 MED ORDER — PROMETHAZINE HCL 25 MG/ML IJ SOLN
25.0000 mg | Freq: Once | INTRAMUSCULAR | Status: AC
  Administered 2011-11-23: 25 mg via INTRAVENOUS
  Filled 2011-11-23: qty 1

## 2011-11-23 NOTE — Progress Notes (Signed)
S:  Pt is feeling better and has been able to tolerate sips of sprite and saltines.  She arrived to MAU earlier this evening via ambulance, but is asking for bus voucher now.  She reports her daughter is also sick and has a fever.  Pt has not thrown up since in MAU.  GFM.  No discernable ctxs, VB, or LOF.  Frustrated with stressors this pregnancy and stated, "if it's not one thing, it's another."    O:  .Marland Kitchen Filed Vitals:   11/23/11 1746  BP: 130/76  Pulse: 98  Temp: 98.4 F (36.9 C)  TempSrc: Oral  Resp: 16  Height: 5\' 3"  (1.6 m)  Weight: 80.196 kg (176 lb 12.8 oz)  SpO2: 99%  PE:  Gen:  NAD, A&Ox3, malaise         CV:  RRR        Lungs:  nml effort        Abd:  Soft, NT, gravid        Pelvic deferred EFM: 135, 10x10 accels, no decels, mod variability--appropriate for GA TOCO:  No UI or ctxs  A:  1.  IUP at 24.3      2.  Emesis in pregnancy (prob viral)      3.  Fetus w/ gastroschisis      4. H/o c/s--desires VBAC      5.  Life stressors      6.  Transportation issues/psychosocial stressor      7.  Anxiety/depression/ADHD      8.  H/o migraines      9.  H/o STDs P:  1.  D/c home on BRAT diet and Zofran 8mg  ODT called to [employer] (#15, 1RF)       2.  Given bus voucher by RN      3.  Keep appt at office 12/02/11, or f/u prn

## 2011-11-23 NOTE — ED Provider Notes (Signed)
History     Chief Complaint  Patient presents with  . Emesis During Pregnancy   HPICassandra Earlene [name] is 31 y.o. [id number] [redacted]w[redacted]d weeks presenting with nausea and vomiting began today.  Has had same sxs with this pregnancy off and on.  Pt of [facility].  Can't eat or drink.  Took Phenergan that was she was unable to keep down. Feels the "hospital version" of the medication works better.  Denies exposure to viral illness.  Penny [name] is aware of patient's admission to MAU, has given orders and plans to see patient when she has finished with a delivery.  Patient is stable and orders have been place.  IV hydration has begun.     Past Medical History  Diagnosis Date  . Preterm labor   . Anxiety   . Depression   . Migraines     Past Surgical History  Procedure Date  . Cesarean section     Family History  Problem Relation Age of Onset  . Cancer Mother   . Hypertension Sister   . Sickle cell trait Daughter   . Asthma Son   . Asthma Paternal Grandmother   . Pseudochol deficiency Neg Hx   . Malignant hyperthermia Neg Hx   . Anesthesia problems Neg Hx   . Hypotension Neg Hx     History  Substance Use Topics  . Smoking status: Current Everyday Smoker -- 0.2 packs/day for 12 years    Types: Cigarettes  . Smokeless tobacco: Never Used  . Alcohol Use: No     PReviously drank on occ    Allergies: No Known Allergies  Prescriptions prior to admission  Medication Sig Dispense Refill  . clotrimazole-betamethasone (LOTRISONE) cream Apply topically 2 (two) times daily. Apply to affected area bid  30 g  0  . cyclobenzaprine (FLEXERIL) 5 MG tablet Take 5 mg by mouth 2 (two) times daily as needed. For migraines and backaches.       . Pediatric Multivit-Minerals-C (FLINTSTONES GUMMIES) chewable tablet Chew 4 tablets by mouth daily.       . Prenatal Vit-Fe Fumarate-FA (PRENATAL MULTIVITAMIN) TABS Take 1 tablet by mouth daily.        ROS Physical Exam   Blood pressure 130/76, pulse 98,  temperature 98.4 F (36.9 C), temperature source Oral, resp. rate 16, height 5\' 3"  (1.6 [name]), weight 176 lb 12.8 oz (80.196 kg), last menstrual period 06/05/2011, SpO2 99.00%.  Physical Exam  MAU Course  Procedures  MDM   Assessment and Plan    Penny [name],Penny [name] 11/23/2011, 7:20 PM   Penny Holmes, NP 11/23/11 1923

## 2011-11-23 NOTE — Progress Notes (Signed)
Patient states she has had about 10 episodes of vomiting since 1000 this am. Arrived by EMS. Denies any pain or bleeding. Reports good fetal movement.

## 2011-12-03 ENCOUNTER — Inpatient Hospital Stay (HOSPITAL_COMMUNITY)
Admission: AD | Admit: 2011-12-03 | Discharge: 2011-12-03 | Disposition: A | Discharge status: Home / Self Care | Payer: Medicaid Other | Source: Ambulatory Visit | Attending: Obstetrics and Gynecology | Admitting: Obstetrics and Gynecology

## 2011-12-03 ENCOUNTER — Encounter (HOSPITAL_COMMUNITY): Payer: Self-pay | Admitting: *Deleted

## 2011-12-03 DIAGNOSIS — O99891 Other specified diseases and conditions complicating pregnancy: Secondary | ICD-10-CM | POA: Insufficient documentation

## 2011-12-03 DIAGNOSIS — O4700 False labor before 37 completed weeks of gestation, unspecified trimester: Secondary | ICD-10-CM | POA: Insufficient documentation

## 2011-12-03 DIAGNOSIS — M545 Low back pain, unspecified: Secondary | ICD-10-CM | POA: Insufficient documentation

## 2011-12-03 DIAGNOSIS — R109 Unspecified abdominal pain: Secondary | ICD-10-CM | POA: Insufficient documentation

## 2011-12-03 LAB — URINE MICROSCOPIC-ADD ON

## 2011-12-03 LAB — URINE CULTURE
Colony Count: 85000
Culture  Setup Time: 201301200245

## 2011-12-03 LAB — URINALYSIS, ROUTINE W REFLEX MICROSCOPIC
Bilirubin Urine: NEGATIVE
Glucose, UA: NEGATIVE mg/dL
Ketones, ur: NEGATIVE mg/dL
Leukocytes, UA: NEGATIVE
Nitrite: NEGATIVE
Protein, ur: NEGATIVE mg/dL
Specific Gravity, Urine: >1.03 — ABNORMAL HIGH (ref 1.005–1.030)
Urobilinogen, UA: 0.2 mg/dL (ref 0.0–1.0)
pH: 5.5 (ref 5.0–8.0)

## 2011-12-03 LAB — FETAL FIBRONECTIN: Fetal Fibronectin: NEGATIVE

## 2011-12-03 MED ORDER — ONDANSETRON 4 MG PO TBDP
4.0000 mg | ORAL_TABLET | ORAL | Status: AC
  Administered 2011-12-03: 4 mg via ORAL
  Filled 2011-12-03: qty 1

## 2011-12-03 MED ORDER — OXYCODONE-ACETAMINOPHEN 5-325 MG PO TABS
1.0000 | ORAL_TABLET | ORAL | Status: DC | PRN
  Administered 2011-12-03: 1 via ORAL
  Filled 2011-12-03: qty 1

## 2011-12-03 MED ORDER — OXYCODONE-ACETAMINOPHEN 5-325 MG PO TABS: 1.0000 | ORAL_TABLET | ORAL | Status: AC | PRN

## 2011-12-03 MED ORDER — ONDANSETRON 4 MG PO TBDP: 4.0000 mg | ORAL_TABLET | Freq: Three times a day (TID) | ORAL | Status: AC | PRN

## 2011-12-03 NOTE — ED Notes (Signed)
PT is cheerful and talking about her kids.

## 2011-12-03 NOTE — ED Notes (Signed)
Pt up to BR

## 2011-12-03 NOTE — ED Notes (Signed)
OK to d/c efm per V. Latham CNM 

## 2011-12-03 NOTE — Progress Notes (Signed)
Manfred Arch CNM notified of pt's admission and status. Finishing up in BS and will see pt. Pt aware.

## 2011-12-03 NOTE — Progress Notes (Signed)
Written and verbal d/c instructions given and understanding voiced. 

## 2011-12-03 NOTE — ED Notes (Signed)
V.Latham CNM notified of pt's neg. FFN, pain better but pt nauseated. Will order Zofran. Pt aware

## 2011-12-03 NOTE — ED Notes (Signed)
Pt had crackers with pain med but now feels naueasted. Pain gone.

## 2011-12-03 NOTE — Progress Notes (Signed)
Pt states, " I've had pain in my abdomen and worse lower right abdomen since Wednesday. I also have pain in my low back off and on also since Thursday. My head started hurting today (points to frontal and temporal) today with nausea. The only way I'm comfortable at all is to stand. I saw Dr Normand Sloop for the pain and she said to take tylenol, but it doesn't help."

## 2011-12-03 NOTE — Progress Notes (Signed)
Manfred Arch CNM in and discussing plan of care with pt,.

## 2011-12-03 NOTE — ED Notes (Signed)
Manfred Arch CNM in to discuss d/c plan with pt. Pt aware FFN negative

## 2011-12-03 NOTE — ED Notes (Signed)
V. Latham CNM in to see pt 

## 2011-12-04 NOTE — ED Provider Notes (Signed)
History   31 yo G3P1011 at 26 weeks presented c/o pelvic pain not relieved by Tylenol, ibuprophen, or Flexeril--seen yesterday by Dr. Normand Sloop, with cervical exam closed.  Denies leaking or bleeding, reports positive FM.  No dysuria.    Pregnancy remarkable for: Fetus with gastroschisis Previous C/S, plans repeat Partial previa per patient report (but was examined yesterday cervically by Dr. Normand Sloop) Anxiety/depression history, no current medication.      OB History    Grav Para Term Preterm Abortions TAB SAB Ect Mult Living   3 2 1 1  0 0 0 0 0 2      Past Medical History  Diagnosis Date  . Preterm labor   . Anxiety   . Depression   . Migraines     Past Surgical History  Procedure Date  . Cesarean section     Family History  Problem Relation Age of Onset  . Cancer Mother   . Hypertension Sister   . Sickle cell trait Daughter   . Asthma Son   . Asthma Paternal Grandmother   . Pseudochol deficiency Neg Hx   . Malignant hyperthermia Neg Hx   . Anesthesia problems Neg Hx   . Hypotension Neg Hx     History  Substance Use Topics  . Smoking status: Current Everyday Smoker -- 0.2 packs/day for 12 years    Types: Cigarettes  . Smokeless tobacco: Never Used  . Alcohol Use: No     PReviously drank on occ    Allergies: No Known Allergies  No prescriptions prior to admission  Used Flexeril, Ibuprophen, Tylenol today   Physical Exam    Chest clear Heart RRR without murmur Abd gravid, NT--points to right round ligament area for pain location. Back negative CVAT Pelvic--cervix closed,long to gentle digital exam FFN negative (done before exam)  Results for orders placed during the hospital encounter of 12/03/11 (from the past 24 hour(s))  URINALYSIS, ROUTINE W REFLEX MICROSCOPIC     Status: Abnormal   Collection Time   12/03/11  7:54 PM      Component Value Range   Color, Urine YELLOW  YELLOW    APPearance CLEAR  CLEAR    Specific Gravity, Urine >1.030 (*)  1.005 - 1.030    pH 5.5  5.0 - 8.0    Glucose, UA NEGATIVE  NEGATIVE (mg/dL)   Hgb urine dipstick TRACE (*) NEGATIVE    Bilirubin Urine NEGATIVE  NEGATIVE    Ketones, ur NEGATIVE  NEGATIVE (mg/dL)   Protein, ur NEGATIVE  NEGATIVE (mg/dL)   Urobilinogen, UA 0.2  0.0 - 1.0 (mg/dL)   Nitrite NEGATIVE  NEGATIVE    Leukocytes, UA NEGATIVE  NEGATIVE   URINE MICROSCOPIC-ADD ON     Status: Abnormal   Collection Time   12/03/11  7:54 PM      Component Value Range   Squamous Epithelial / LPF MANY (*) RARE    WBC, UA 3-6  <3 (WBC/hpf)   RBC / HPF 7-10  <3 (RBC/hpf)   Bacteria, UA MANY (*) RARE    Crystals CA OXALATE CRYSTALS (*) NEGATIVE    Urine-Other RARE YEAST    FETAL FIBRONECTIN     Status: Normal   Collection Time   12/03/11  9:15 PM      Component Value Range   Fetal Fibronectin NEGATIVE  NEGATIVE    Urine to culture  ED Course  IUP at 26 weeks Pelvic discomfort, no s/s PTL Fetal gastroschisis Previous C/S Partial previa  Plan:  D/C home with Percocet for limited use. Reviewed common discomforts of pregnancy, with comfort measures reviewed. Discussed issues with anxiety/anger/tolerance--declines meds at present, will continue to evaluate for need. Expects MFM [place] this week--will have office confirm that plan.   Nigel Bridgeman, CNM, [place] 12/03/11  11p

## 2011-12-05 ENCOUNTER — Other Ambulatory Visit (HOSPITAL_COMMUNITY): Payer: Self-pay | Admitting: Obstetrics and Gynecology

## 2011-12-05 DIAGNOSIS — O34219 Maternal care for unspecified type scar from previous cesarean delivery: Secondary | ICD-10-CM

## 2011-12-05 DIAGNOSIS — O358XX0 Maternal care for other (suspected) fetal abnormality and damage, not applicable or unspecified: Secondary | ICD-10-CM

## 2011-12-05 DIAGNOSIS — Z8751 Personal history of pre-term labor: Secondary | ICD-10-CM

## 2011-12-05 DIAGNOSIS — O99340 Other mental disorders complicating pregnancy, unspecified trimester: Secondary | ICD-10-CM

## 2011-12-06 ENCOUNTER — Other Ambulatory Visit (HOSPITAL_COMMUNITY): Payer: Self-pay | Admitting: Obstetrics and Gynecology

## 2011-12-06 ENCOUNTER — Ambulatory Visit (HOSPITAL_COMMUNITY)
Admission: RE | Admit: 2011-12-06 | Discharge: 2011-12-06 | Disposition: A | Discharge status: Home / Self Care | Payer: Medicaid Other | Source: Ambulatory Visit | Attending: Obstetrics and Gynecology | Admitting: Obstetrics and Gynecology

## 2011-12-06 DIAGNOSIS — Z8751 Personal history of pre-term labor: Secondary | ICD-10-CM

## 2011-12-06 DIAGNOSIS — O4400 Placenta previa specified as without hemorrhage, unspecified trimester: Secondary | ICD-10-CM | POA: Insufficient documentation

## 2011-12-06 DIAGNOSIS — O358XX0 Maternal care for other (suspected) fetal abnormality and damage, not applicable or unspecified: Secondary | ICD-10-CM

## 2011-12-06 DIAGNOSIS — O99330 Smoking (tobacco) complicating pregnancy, unspecified trimester: Secondary | ICD-10-CM | POA: Insufficient documentation

## 2011-12-06 DIAGNOSIS — O34219 Maternal care for unspecified type scar from previous cesarean delivery: Secondary | ICD-10-CM

## 2011-12-06 DIAGNOSIS — O99340 Other mental disorders complicating pregnancy, unspecified trimester: Secondary | ICD-10-CM

## 2011-12-07 ENCOUNTER — Observation Stay (HOSPITAL_COMMUNITY)
Admission: AD | Admit: 2011-12-07 | Discharge: 2011-12-08 | DRG: 782 | Disposition: A | Discharge status: Home / Self Care | Payer: Medicaid Other | Source: Ambulatory Visit | Attending: Obstetrics and Gynecology | Admitting: Obstetrics and Gynecology

## 2011-12-07 ENCOUNTER — Encounter (HOSPITAL_COMMUNITY): Payer: Self-pay | Admitting: *Deleted

## 2011-12-07 DIAGNOSIS — O358XX0 Maternal care for other (suspected) fetal abnormality and damage, not applicable or unspecified: Secondary | ICD-10-CM | POA: Diagnosis present

## 2011-12-07 DIAGNOSIS — IMO0002 Reserved for concepts with insufficient information to code with codable children: Secondary | ICD-10-CM

## 2011-12-07 DIAGNOSIS — O4400 Placenta previa specified as without hemorrhage, unspecified trimester: Secondary | ICD-10-CM | POA: Diagnosis present

## 2011-12-07 DIAGNOSIS — O4410 Placenta previa with hemorrhage, unspecified trimester: Principal | ICD-10-CM | POA: Diagnosis present

## 2011-12-07 DIAGNOSIS — O26852 Spotting complicating pregnancy, second trimester: Secondary | ICD-10-CM | POA: Diagnosis present

## 2011-12-07 DIAGNOSIS — N939 Abnormal uterine and vaginal bleeding, unspecified: Secondary | ICD-10-CM

## 2011-12-07 MED ORDER — DOCUSATE SODIUM 100 MG PO CAPS
100.0000 mg | ORAL_CAPSULE | Freq: Every day | ORAL | Status: DC
  Administered 2011-12-07: 100 mg via ORAL
  Filled 2011-12-07: qty 1

## 2011-12-07 MED ORDER — CYCLOBENZAPRINE HCL 10 MG PO TABS: 10.0000 mg | ORAL_TABLET | Freq: Three times a day (TID) | ORAL | Status: DC | PRN

## 2011-12-07 MED ORDER — DOCUSATE SODIUM 100 MG PO CAPS: 100.0000 mg | ORAL_CAPSULE | Freq: Every day | ORAL | Status: DC

## 2011-12-07 MED ORDER — CALCIUM CARBONATE ANTACID 500 MG PO CHEW: 2.0000 | CHEWABLE_TABLET | ORAL | Status: DC | PRN

## 2011-12-07 MED ORDER — PRENATAL MULTIVITAMIN CH
1.0000 | ORAL_TABLET | Freq: Every day | ORAL | Status: DC
  Administered 2011-12-08: 1 via ORAL
  Filled 2011-12-07: qty 1

## 2011-12-07 MED ORDER — IBUPROFEN 600 MG PO TABS
600.0000 mg | ORAL_TABLET | Freq: Four times a day (QID) | ORAL | Status: DC | PRN
  Administered 2011-12-08: 600 mg via ORAL
  Filled 2011-12-07: qty 1

## 2011-12-07 MED ORDER — ACETAMINOPHEN 325 MG PO TABS: 650.0000 mg | ORAL_TABLET | ORAL | Status: DC | PRN

## 2011-12-07 MED ORDER — ZOLPIDEM TARTRATE 10 MG PO TABS
10.0000 mg | ORAL_TABLET | Freq: Every evening | ORAL | Status: DC | PRN
  Administered 2011-12-08: 10 mg via ORAL
  Filled 2011-12-07: qty 1

## 2011-12-07 MED ORDER — SODIUM CHLORIDE 0.9 % IJ SOLN
INTRAMUSCULAR | Status: AC
  Filled 2011-12-07: qty 6

## 2011-12-07 NOTE — Plan of Care (Signed)
Problem: Consults Goal: Birthing Suites Patient Information Press F2 to bring up selections list  Outcome: Completed/Met Date Met:  12/07/11  Antenatal Patient (< 37 weeks)  Comments:  ante

## 2011-12-07 NOTE — Progress Notes (Signed)
Penny [name] is a 31 y.o. G3P1102 at [redacted]w[redacted]d by LMP admitted for third trimester bleeding  Subjective: GI: negative GU: neg OB: good fetal movement        Objective: BP 108/66  Pulse 87  Temp(Src) 97.4 F (36.3 C) (Oral)  Resp 16  Ht 5' 3.25" (1.607 m)  Wt 178 lb (80.74 kg)  BMI 31.28 kg/m2  LMP 06/05/2011      FHT:  FHR: 130s bpm, variability: minimal ,  accelerations:  Present,  decelerations:  Absent UC:   none SVE:      Labs: Lab Results  Component Value Date   WBC 14.0* 11/23/2011   HGB 12.1 11/23/2011   HCT 35.9* 11/23/2011   MCV 84.7 11/23/2011   PLT 280 11/23/2011    Assessment / Plan: IUP  at 26w 3d Late second trimester bleeding No evidence preterm labor Recent diagnosis of placenta previa Fetal gastroschesis History of prior c-section  Fetal Wellbeing:  reassuring for gestational age Plan: Admit to antenatal for observation  Jael Waldorf P 12/07/2011, 5:23 PM

## 2011-12-07 NOTE — Progress Notes (Signed)
C/o spotting around 1200; placenta previa this pregnancy; pt had u/s done yesterday;

## 2011-12-07 NOTE — ED Notes (Signed)
Saline lock place in L hand, #20, 1st attempt;

## 2011-12-07 NOTE — Progress Notes (Signed)
Pt brought in by EMS for spotting/ no pain; pt's 31 yr old is with her; hx of placenta previa with previous pregnancy;

## 2011-12-08 ENCOUNTER — Inpatient Hospital Stay (HOSPITAL_COMMUNITY): Payer: Medicaid Other

## 2011-12-08 ENCOUNTER — Inpatient Hospital Stay (HOSPITAL_COMMUNITY): Admit: 2011-12-08 | Payer: Medicaid Other

## 2011-12-08 DIAGNOSIS — O26852 Spotting complicating pregnancy, second trimester: Secondary | ICD-10-CM | POA: Diagnosis present

## 2011-12-08 DIAGNOSIS — O4400 Placenta previa specified as without hemorrhage, unspecified trimester: Secondary | ICD-10-CM | POA: Diagnosis present

## 2011-12-08 MED ORDER — PROMETHAZINE HCL 25 MG PO TABS
25.0000 mg | ORAL_TABLET | Freq: Four times a day (QID) | ORAL | Status: DC | PRN
  Administered 2011-12-08: 25 mg via ORAL
  Filled 2011-12-08: qty 1

## 2011-12-08 NOTE — Progress Notes (Signed)
BPP performed.  Results 8/8.  Please see full report in [facility].

## 2011-12-08 NOTE — Progress Notes (Signed)
Cardio difficult to trace. Pt up to shower

## 2011-12-08 NOTE — H&P (Addendum)
Penny [name] is a 31 y.o. single black female presenting at 26.3 weeks per [facility] 03/11/12 for brown spotting since approximately noon. Reports occ'l "braxton Willa Rough" (more specifically 3-4 thus far today), but none upon arrival to MAU, and no other pain to report.  Pt most recently seen in MAU 12/03/11 for "pelvic pain," and FFN done and negative.  Pt followed by MFM secondary to fetal gastroschisis, and last there on 12/06/11 and u/s showed complete previa.  Please refer to Dr. Eber Jones note in u/s report for full details, but in summary, report from 12/06/11 showed:  Transverse lie; 6/8 BPP for no Fetal Breathing movement; EFW=1+6 (13%); HC, AC, and FL all <3%; Humerus <%5.  AUA=24 weeks.  Nml umbilical artery doppler study.    Pt had called after hours number around [phone] on 12/06/11 to ask questions regarding diagnosis of complete previa and briefly disc'd with patient at that time that c/s would be necessary and pelvic rest for remainder of pregnancy, but to discuss questions and prognosis further at her office appt this coming Friday.   Pt denies anything per vagina since yesterday's conversation, no LOF, no fever, chills, or body aches.  Has several prn medications for migraines/pelvic pain, and nausea, but has taken any since weekend (i.e. Flexeril, Percocet, Phenergan and Zofran).    OB Hx:   G1=SVD in 2003 at 40 weeks, Female=6+12 in [place] (had episiotomy) G2=c/s in 2010 at 36 weeks for previa; Female=5+10 G3=current  Maternal Medical History:  Reason for admission: Reason for admission: vaginal bleeding.  Contractions: Frequency: rare.    Fetal activity: Perceived fetal activity is normal.   Last perceived fetal movement was within the past hour.    Prenatal complications: 1.  Previous c/s for placenta previa 2.  Previous PTD secondary to previa 3.  Fetal Gastroschisis this pregnancy 4.  Smoker 5.  H/o fibroids 6.  H/o stds 7.  Migraines 8.  Depression/anxiety/ADHD 9.  H/o  intermenstrual bleeding 10.  Life stressors 11.  Transportation issues    OB History    Grav Para Term Preterm Abortions TAB SAB Ect Mult Living   3 2 1 1  0 0 0 0 0 2     Past Medical History  Diagnosis Date  . Preterm labor   . Anxiety   . Depression   . Migraines    Past Surgical History  Procedure Date  . Cesarean section    Family History: family history includes Asthma in her paternal grandmother and son; Cancer in her mother; Hypertension in her sister; and Sickle cell trait in her daughter.  There is no history of Pseudochol deficiency, and Malignant hyperthermia, and Anesthesia problems, and Hypotension, . Social History:  reports that she has been smoking Cigarettes.  She has a 3 pack-year smoking history. She has never used smokeless tobacco. She reports that she does not drink alcohol or use illicit drugs.  Review of Systems  Constitutional: Negative.   HENT: Negative.   Eyes: Negative.   Respiratory: Negative.   Cardiovascular: Negative.   Gastrointestinal: Negative.   Genitourinary: Negative.   Musculoskeletal:       Recent pelvic pain--seen in MAU 12/03/11  Skin: Negative.   Neurological: Negative.       Blood pressure 102/60, pulse 85, temperature 97.9 F (36.6 C), temperature source Oral, resp. rate 18, height 5' 3.25" (1.607 m), weight 80.74 kg (178 lb), last menstrual period 06/05/2011. Maternal Exam:  Uterine Assessment: Contraction strength is mild.  Contraction frequency is rare.  Abdomen: Patient reports no abdominal tenderness. Surgical scars: low transverse.   Introitus: Normal vulva. Cervix: not evaluated.   Fetal Exam Fetal Monitor Review: Mode: ultrasound.   Baseline rate: 135-140.  Variability: moderate (6-25 bpm).   Pattern: no accelerations and no decelerations.   occ'l 10x10 accels  Fetal State Assessment: Category I - tracings are normal.     Physical Exam  Constitutional: She is oriented to person, place, and time. She  appears well-developed and well-nourished. No distress.  Cardiovascular: Normal rate and regular rhythm.   Respiratory: Effort normal and breath sounds normal.  GI: Soft. Bowel sounds are normal.       gravid  Genitourinary:       Introitus w/ small amt of tan-tinged d/c noted; bimanual exam deferred. No spotting noted on Pad in MAU; brought with her underwear  having small amt brown blood  Neurological: She is alert and oriented to person, place, and time.  Skin: Skin is warm and dry.    Prenatal labs: ABO, Rh: --/--/B POS (07/23 2237) Antibody: NEG (07/23 2230) Rubella:   RPR: Nonreactive (09/12 0000)  HBsAg: Negative (09/12 0000)  HIV: Non-reactive (09/12 0000)  GBS:    FFN 12/03/11 negative Urine cx:  12/03/11 85,000 colonies and multiple morphocytes  Assessment/Plan: 1.  IUP at 26.3 2.  Known complete placenta previa w/ 1st episode of vaginal bleeding  3.  Gastroschisis 4.  Fetal growth restriction--EFW=13% 12/06/11 and 6/8 BPP 5.  Prev c/s 6.  No s/s of PTL 7.  Rh positive 8.  Anxiety/depression/ADHD--no meds 9.  Life stressor/transportation issues  1.  Per c/w Dr. Pennie Rushing, admit to antenatal unit with rout orders for [facility] and contraction monitoring 2.  Pad count, continuous monitoring 3.  Per Dr. Eber Jones note from 1/22/13weekly BPP's w/ UA doppler until 30 weeks; growth again in 3 weeks; then at 30 weeks, twice weekly NST with weekly AFI & UA doppler 4.  Per Dr. Eber Jones note from 12/06/11, deliver at 36 weeks or if clinically indicated for hemorrhage or nonreassuring FHT. 5.  MD to follow  STEELMAN,CANDICE H 12/08/2011, 8:54 AM

## 2011-12-08 NOTE — Progress Notes (Signed)
Cardio off for now

## 2011-12-08 NOTE — Progress Notes (Signed)
At the bs, attempting to readj. Cardio for 30 min

## 2011-12-08 NOTE — Discharge Summary (Signed)
Physician Discharge Summary  Patient ID: Penny [name] MRN: [id number] DOB/AGE: [birth year] 30 y.o.  Admit date: 12/07/2011 Discharge date: 12/08/2011  Admission Diagnoses:  IUP at 26 4/7 weeks                                          Complete previa, with bleeding episode                                          Fetal gastroschisis  Discharge Diagnoses:  Principal Problem:  *Total placenta previa Active Problems:  Placenta previa antepartum  Spotting complicating pregnancy in second trimester   Discharged Condition: stable  Hospital Course: Admitted on 12/07/11 with episode of small volume of brown d/c vs bleeding--MFM [place] confirmed complete placenta previa, normal growth and fluid, and the previously noted gastroschisis.  No contractions were noted.  FHR was reassuring, and BPP on 12/08/11 was 8/8 with normal fluid.  No further bleeding occurred, and the patient was discharged home in stable condition.  She received a NICU tour before going home.   She is to continue bedrest at home with pelvic rest.    Follow-up:  [place] with BPP and doppler flow each week, with growth added to [place] every 2 weeks.  Consults: MFM  Significant Diagnostic Studies:  [place]  Treatments: None  Discharge Exam: Blood pressure 123/68, pulse 97, temperature 97.9 F (36.6 C), temperature source Oral, resp. rate 18, height 5' 3.25" (1.607 m), weight 80.74 kg (178 lb), last menstrual period 06/05/2011. General appearance: alert Cardio: regular rate and rhythm, S1, S2 normal, no murmur, click, rub or gallop Pelvic: Deferred.  Uterus gravid, NT, no contractions.  FHR reassuring for gestational age. Extremities: extremities normal, atraumatic, no cyanosis or edema  Disposition: Home or Self Care   Medication List  As of 12/08/2011  3:52 PM   TAKE these medications         acetaminophen 325 MG tablet   Commonly known as: TYLENOL   Take 650 mg by mouth every 6 (six) hours as needed. For pain      cyclobenzaprine  5 MG tablet   Commonly known as: FLEXERIL   Take 5 mg by mouth 2 (two) times daily as needed. For migraines and backaches.      ibuprofen 200 MG tablet   Commonly known as: ADVIL,MOTRIN   Take 200 mg by mouth every 6 (six) hours as needed. For pain      ondansetron 4 MG disintegrating tablet   Commonly known as: ZOFRAN-ODT   Take 1 tablet (4 mg total) by mouth every 8 (eight) hours as needed for nausea.      oxyCODONE-acetaminophen 5-325 MG per tablet   Commonly known as: PERCOCET   Take 1 tablet by mouth every 4 (four) hours as needed.      prenatal multivitamin Tabs   Take 1 tablet by mouth daily.           Follow-up Information    Follow up with [facility], MD on 12/12/2011. (Ultrasound and visit with Dr. Normand Sloop.  Call otherwise with any questions or concerns.)    Contact information:   [address] [place] [address] [phone]          Signed: Nigel Bridgeman 12/08/2011, 3:52  PM   

## 2011-12-08 NOTE — Progress Notes (Signed)
Hospital day # 1 pregnancy at [redacted]w[redacted]d  S: well, reports good fetal activity      Contractions:none      Vaginal bleeding:none since admission       Vaginal discharge: no significant change  O: BP 123/68  Pulse 97  Temp(Src) 97.9 F (36.6 C) (Oral)       Fetal tracings:reviewed and reassuring for gestational age      Extremities: no significant edema and no signs of DVT  A: [redacted]w[redacted]d with previa     stable  P: await BPP.If reassuring, will D/C home on bedrest with weekly visit / fetal monitoring  Madylin Fairbank A  MD 12/08/2011 12:30 PM

## 2011-12-16 ENCOUNTER — Inpatient Hospital Stay (HOSPITAL_COMMUNITY)
Admission: AD | Admit: 2011-12-16 | Discharge: 2011-12-17 | Disposition: A | Discharge status: Home / Self Care | Payer: Medicaid Other | Source: Ambulatory Visit | Attending: Obstetrics and Gynecology | Admitting: Obstetrics and Gynecology

## 2011-12-16 DIAGNOSIS — O368190 Decreased fetal movements, unspecified trimester, not applicable or unspecified: Secondary | ICD-10-CM | POA: Insufficient documentation

## 2011-12-16 DIAGNOSIS — O4410 Placenta previa with hemorrhage, unspecified trimester: Secondary | ICD-10-CM | POA: Insufficient documentation

## 2011-12-16 NOTE — Progress Notes (Signed)
Pt states, " I haven't felt any movement since 2pm-3 pm today.

## 2011-12-17 ENCOUNTER — Encounter (HOSPITAL_COMMUNITY): Payer: Self-pay | Admitting: *Deleted

## 2011-12-17 NOTE — Progress Notes (Signed)
Baby has hiccoughs. Transducer adjusted.

## 2011-12-17 NOTE — ED Notes (Signed)
Pt request taxi voucher. Karna Dupes RN AC called. We do not give taxi vouchers. Pt aware and understanding voiced

## 2011-12-17 NOTE — Progress Notes (Signed)
Written and verbal d/c instructions given and understanding voiced. 

## 2011-12-17 NOTE — ED Notes (Signed)
Gevena Barre CNM in to see pt. EFM reviewed.

## 2011-12-17 NOTE — ED Notes (Signed)
Sanda Klein CNM on unit and aware of pt's admission and status.

## 2011-12-17 NOTE — ED Provider Notes (Signed)
History     Chief Complaint  Patient presents with  . Decreased Fetal Movement   HPI Comments: Pt is a G3P1102 at [redacted]w[redacted]d that arrived with CC of no fetal movement since about 3pm. Denies any ctx, VB or LOF.  Pregnancy significant for: 1.  Previous c/s for placenta previa 2.  Previous PTD secondary to previa 3.  Fetal Gastroschisis this pregnancy 4.  Smoker 5.  H/o fibroids 6.  H/o stds 7.  Migraines 8.  Depression/anxiety/ADHD 9.  H/o intermenstrual bleeding 10.  Life stressors 11.  Transportation issues    After hearing FHR pt now reports feeling fetal movements Past Medical History  Diagnosis Date  . Preterm labor   . Anxiety   . Depression   . Migraines     Past Surgical History  Procedure Date  . Cesarean section     Family History  Problem Relation Age of Onset  . Cancer Mother   . Hypertension Sister   . Sickle cell trait Daughter   . Asthma Son   . Asthma Paternal Grandmother   . Pseudochol deficiency Neg Hx   . Malignant hyperthermia Neg Hx   . Anesthesia problems Neg Hx   . Hypotension Neg Hx     History  Substance Use Topics  . Smoking status: Current Everyday Smoker -- 0.2 packs/day for 12 years    Types: Cigarettes  . Smokeless tobacco: Never Used  . Alcohol Use: No     PReviously drank on occ    Allergies: No Known Allergies  Prescriptions prior to admission  Medication Sig Dispense Refill  . acetaminophen (TYLENOL) 325 MG tablet Take 650 mg by mouth every 6 (six) hours as needed. For pain      . cyclobenzaprine (FLEXERIL) 5 MG tablet Take 5 mg by mouth 2 (two) times daily as needed. For migraines and backaches.       . Prenatal Vit-Fe Fumarate-FA (PRENATAL MULTIVITAMIN) TABS Take 1 tablet by mouth daily.      Marland Kitchen ibuprofen (ADVIL,MOTRIN) 200 MG tablet Take 200 mg by mouth every 6 (six) hours as needed. For pain        Review of Systems  All other systems reviewed and are negative.   Physical Exam   Blood pressure 102/62,  temperature 98.5 F (36.9 C), temperature source Oral, resp. rate 20, height 5' 2.5" (1.588 m), weight 83.462 kg (184 lb), last menstrual period 06/05/2011.  Physical Exam  Nursing note and vitals reviewed. Constitutional: She is oriented to person, place, and time. She appears well-developed and well-nourished.  HENT:  Head: Normocephalic.  Neck: Normal range of motion.  Cardiovascular: Normal rate.   Respiratory: Effort normal.  GI: Soft.  Genitourinary:       deferred  Musculoskeletal: Normal range of motion.  Neurological: She is alert and oriented to person, place, and time.  Skin: Skin is warm and dry.  Psychiatric: She has a normal mood and affect. Her behavior is normal.   FHR 130 10x10 accels, no decels, audible fetal movement CAT 1 toco quiet  MAU Course  Procedures    Assessment and Plan  IUP at [redacted]w[redacted]d Complete previa Dec FM FHR reassuring D/C home [facility] F/u as scheduled at office    Mareena Cavan M 12/17/2011, 12:19 AM

## 2011-12-20 ENCOUNTER — Ambulatory Visit (HOSPITAL_COMMUNITY)
Admission: RE | Admit: 2011-12-20 | Discharge: 2011-12-20 | Disposition: A | Discharge status: Home / Self Care | Payer: Medicaid Other | Source: Ambulatory Visit | Attending: Obstetrics and Gynecology | Admitting: Obstetrics and Gynecology

## 2011-12-20 ENCOUNTER — Other Ambulatory Visit (HOSPITAL_COMMUNITY): Payer: Self-pay | Admitting: Maternal and Fetal Medicine

## 2011-12-20 ENCOUNTER — Other Ambulatory Visit: Payer: Self-pay | Admitting: Obstetrics and Gynecology

## 2011-12-20 DIAGNOSIS — O358XX0 Maternal care for other (suspected) fetal abnormality and damage, not applicable or unspecified: Secondary | ICD-10-CM

## 2011-12-20 DIAGNOSIS — IMO0002 Reserved for concepts with insufficient information to code with codable children: Secondary | ICD-10-CM

## 2011-12-20 DIAGNOSIS — O99330 Smoking (tobacco) complicating pregnancy, unspecified trimester: Secondary | ICD-10-CM | POA: Insufficient documentation

## 2011-12-20 DIAGNOSIS — O4400 Placenta previa specified as without hemorrhage, unspecified trimester: Secondary | ICD-10-CM | POA: Insufficient documentation

## 2011-12-20 DIAGNOSIS — O34219 Maternal care for unspecified type scar from previous cesarean delivery: Secondary | ICD-10-CM | POA: Insufficient documentation

## 2011-12-20 DIAGNOSIS — Z8751 Personal history of pre-term labor: Secondary | ICD-10-CM | POA: Insufficient documentation

## 2011-12-20 NOTE — Progress Notes (Signed)
SW met with patient after her appointment at [facility] today to provide support and discuss her current situation.  Patient is very open and stated concerns about transportation and child care when her baby is born.  She states that the majority of her support network is in [place].  She has a 32 year old son and a 4 year old daughter.  The father of her 45 year old is the father of this baby, but the relationship sounds strained and uncertain.  Patient had her best friend with her today, who is helping her make plans and will be as available as she can be although she is also pregnant with plans to move home to [place] in the coming weeks.  Patient would like for the baby's surgery to take place in [place] as this would be the least disruptive to her life and her son's schooling.  Transportation and lodging will not be an issue if the baby can be cared for in [place].  If the baby cannot be cared for here, patient may need to send her son to [place] or [place] to finish the school year.  She does not want to pull him out of school here, but knows that she may have to in order to be available to her newborn.  SW and patient discussed the various scenarios and SW informed patient that SW will do everything possible to help her get through this situation including emotional support, transportation and lodging in [place] if the baby needs surgery at [place], but there are extremely limited resources for child care.  SW states that if she cannot find family and friends to care for her children and finds that she cannot care for them while baby is in the hospital, temporary foster care may be an option.  She understandably does not want to consider this.  At one point in the conversation, patient mentioned a past desire to make an adoption plan, as this baby was not planned, but she states she no longer wants to consider this as an option and has decided to parent.  SW informed patient that SW will  inquire more about the situation and the decision on where the baby will be cared for by talking with Dede Query President and Dr. Odette Fraction of NICU.  Patient was very Adult nurse.  SW gave contact information and asked patient to call SW at any time.

## 2011-12-20 NOTE — Progress Notes (Signed)
SW left message for Dede Query President to discuss the situation.  SW received a call from Dr. Jenny Reichmann stating that it has been determined that the baby's surgery will have to take place at [facility] and that he is looking in to the possibility that MOB can have her c-section there so that she and baby will not be separated.  This is something SW and patient discussed and SW thinks that patient would prefer this since the baby's care will have to take place at [facility].  SW states that patient has identified transportation, child care and out of town lodging as stressors, but SW will assist as much as possible with transportation and lodging and patient will discuss child care assistance with her support system.  SW spoke to Dr. Odette Fraction of NICU to discuss the social situation.  He states that there is not an anesthesiologist to complete the surgery here, but recommended follow up with Arline Asp F regarding patient's social situation.  SW will follow up.  SW attempted to call patient, but she did not answer and there was no option to leave a message.  SW will attempt again at a later time in order to follow up with her.

## 2011-12-21 NOTE — Progress Notes (Signed)
UR chart review completed.  

## 2011-12-26 ENCOUNTER — Other Ambulatory Visit: Payer: Self-pay | Admitting: Obstetrics and Gynecology

## 2011-12-30 ENCOUNTER — Other Ambulatory Visit: Payer: Self-pay | Admitting: Obstetrics and Gynecology

## 2011-12-30 DIAGNOSIS — IMO0002 Reserved for concepts with insufficient information to code with codable children: Secondary | ICD-10-CM

## 2012-01-06 ENCOUNTER — Ambulatory Visit (HOSPITAL_COMMUNITY): Payer: Medicaid Other

## 2012-01-10 ENCOUNTER — Other Ambulatory Visit: Payer: Medicaid Other

## 2012-01-10 ENCOUNTER — Encounter (INDEPENDENT_AMBULATORY_CARE_PROVIDER_SITE_OTHER): Payer: Medicaid Other | Admitting: Obstetrics and Gynecology

## 2012-01-10 DIAGNOSIS — O99330 Smoking (tobacco) complicating pregnancy, unspecified trimester: Secondary | ICD-10-CM

## 2012-01-10 DIAGNOSIS — O4400 Placenta previa specified as without hemorrhage, unspecified trimester: Secondary | ICD-10-CM

## 2012-01-10 DIAGNOSIS — Z331 Pregnant state, incidental: Secondary | ICD-10-CM

## 2012-01-10 DIAGNOSIS — O358XX0 Maternal care for other (suspected) fetal abnormality and damage, not applicable or unspecified: Secondary | ICD-10-CM

## 2012-01-10 DIAGNOSIS — O34219 Maternal care for unspecified type scar from previous cesarean delivery: Secondary | ICD-10-CM

## 2012-01-14 ENCOUNTER — Encounter (HOSPITAL_COMMUNITY): Payer: Self-pay | Admitting: *Deleted

## 2012-01-14 ENCOUNTER — Inpatient Hospital Stay (HOSPITAL_COMMUNITY)
Admission: AD | Admit: 2012-01-14 | Discharge: 2012-01-15 | Disposition: A | Discharge status: Home / Self Care | Payer: Medicaid Other | Source: Ambulatory Visit | Attending: Obstetrics and Gynecology | Admitting: Obstetrics and Gynecology

## 2012-01-14 DIAGNOSIS — O99891 Other specified diseases and conditions complicating pregnancy: Secondary | ICD-10-CM

## 2012-01-14 DIAGNOSIS — M549 Dorsalgia, unspecified: Secondary | ICD-10-CM

## 2012-01-14 DIAGNOSIS — O4700 False labor before 37 completed weeks of gestation, unspecified trimester: Secondary | ICD-10-CM | POA: Insufficient documentation

## 2012-01-14 NOTE — Progress Notes (Signed)
Pt states, " I had a real sharp pain over my whole abdomen and it went into my back. It happened three times. I layed in the tub for 20-30 min and on the way here it has calmed down."

## 2012-01-15 DIAGNOSIS — O4700 False labor before 37 completed weeks of gestation, unspecified trimester: Secondary | ICD-10-CM

## 2012-01-15 LAB — FETAL FIBRONECTIN: Fetal Fibronectin: NEGATIVE

## 2012-01-15 MED ORDER — CYCLOBENZAPRINE HCL 10 MG PO TABS
10.0000 mg | ORAL_TABLET | Freq: Once | ORAL | Status: AC
  Administered 2012-01-15: 10 mg via ORAL
  Filled 2012-01-15: qty 1

## 2012-01-15 MED ORDER — CYCLOBENZAPRINE HCL 10 MG PO TABS: 10.0000 mg | ORAL_TABLET | Freq: Three times a day (TID) | ORAL | Status: AC | PRN

## 2012-01-15 NOTE — Progress Notes (Signed)
[name] CNM in discussing d/c plan and neg FFN. Written and verbal d/c instructions given and understanding voiced.

## 2012-01-15 NOTE — ED Notes (Signed)
Fetal fibronectin collected by [name] and sent to lab

## 2012-01-15 NOTE — ED Notes (Signed)
[name] CNM in with pt

## 2012-01-15 NOTE — Discharge Instructions (Signed)

## 2012-01-15 NOTE — ED Provider Notes (Signed)
History     Chief Complaint  Patient presents with  . Contractions  . Back Pain   HPI Pt presents with c/o of uterine contractions occurring approximately 5 times per hour over the past 2 hrs with increased intensity.  She reports she has tried increasing po liquids as well as a warm bath without relief. She also c/o constant low back pain as well.  Pt denies any vaginal bleeding or leakage of amniotic fluid.  She reports her fetus is moving normally.  She is concerned due to the fact that she has placenta previa and her baby has gastroschesis.  She reports that since arriving at [facility], she has noted decreased UCs.    OB History    Grav Para Term Preterm Abortions TAB SAB Ect Mult Living   3 2 1 1  0 0 0 0 0 2      Past Medical History  Diagnosis Date  . Preterm labor   . Anxiety   . Depression   . Migraines     Past Surgical History  Procedure Date  . Cesarean section     Family History  Problem Relation Age of Onset  . Cancer Mother   . Hypertension Sister   . Sickle cell trait Daughter   . Asthma Son   . Asthma Paternal Grandmother   . Pseudochol deficiency Neg Hx   . Malignant hyperthermia Neg Hx   . Anesthesia problems Neg Hx   . Hypotension Neg Hx     History  Substance Use Topics  . Smoking status: Current Everyday Smoker -- 0.2 packs/day for 12 years    Types: Cigarettes  . Smokeless tobacco: Never Used  . Alcohol Use: No     PReviously drank on occ    Allergies: No Known Allergies  Prescriptions prior to admission  Medication Sig Dispense Refill  . Prenatal Vit-Fe Fumarate-FA (PRENATAL MULTIVITAMIN) TABS Take 1 tablet by mouth daily.       Review of Systems  Constitutional: Negative.   HENT: Negative.   Eyes: Negative.   Respiratory: Negative.   Cardiovascular: Negative.   Gastrointestinal: Negative.   Genitourinary: Negative.   Musculoskeletal: Positive for back pain.  Skin: Negative.   Neurological: Negative.   Endo/Heme/Allergies:  Negative.   Psychiatric/Behavioral: Negative.    Physical Exam   Blood pressure 107/70, pulse 81, temperature 97.6 F (36.4 C), temperature source Oral, resp. rate 20, height 5\' 3"  (1.6 m), weight 84.823 kg (187 lb), last menstrual period 06/05/2011.  Physical Exam  Constitutional: She is oriented to person, place, and time. She appears well-developed and well-nourished.  HENT:  Head: Normocephalic and atraumatic.  Right Ear: External ear normal.  Left Ear: External ear normal.  Nose: Nose normal.  Eyes: Conjunctivae are normal. Pupils are equal, round, and reactive to light.  Neck: Normal range of motion. Neck supple. No thyromegaly present.  Cardiovascular: Normal rate, regular rhythm and intact distal pulses.   Respiratory: Effort normal and breath sounds normal.  GI: Soft. Bowel sounds are normal.  Genitourinary: Vagina normal and uterus normal.       Ut gravid, soft, non-tender.   Musculoskeletal: Normal range of motion.  Neurological: She is alert and oriented to person, place, and time. She has normal reflexes.  Skin: Skin is warm and dry.  Psychiatric: She has a normal mood and affect. Her behavior is normal.  FHR  Baseline 135 bpm with moderate variability present.  Accels present.  No decels noted. 3 UCs in 1  hr noted on toco, mild to palpation. SVE deferred due to previa  [facility] Course  Procedures FFN:  Negative  Assessment and Plan  IUP at 32w 4d Preterm uterine contractions.  C/W Dr. Stefano Gaul. Due to neg FFN and rare UCs on toco, will discharge to home. Reviewed signs/symptoms of preterm labor.  Revd pelvic rest.   RTO [facility] as previously scheduled.    Makalya Nave O. 01/15/2012, 1:25 AM

## 2012-01-17 ENCOUNTER — Encounter: Payer: Medicaid Other | Admitting: Obstetrics and Gynecology

## 2012-01-17 ENCOUNTER — Other Ambulatory Visit: Payer: Medicaid Other

## 2012-01-18 ENCOUNTER — Other Ambulatory Visit: Payer: Medicaid Other

## 2012-01-18 ENCOUNTER — Encounter (INDEPENDENT_AMBULATORY_CARE_PROVIDER_SITE_OTHER): Payer: Medicaid Other | Admitting: Obstetrics and Gynecology

## 2012-01-18 DIAGNOSIS — O26849 Uterine size-date discrepancy, unspecified trimester: Secondary | ICD-10-CM

## 2012-01-18 DIAGNOSIS — O99330 Smoking (tobacco) complicating pregnancy, unspecified trimester: Secondary | ICD-10-CM

## 2012-01-18 DIAGNOSIS — Z331 Pregnant state, incidental: Secondary | ICD-10-CM

## 2012-01-18 DIAGNOSIS — O358XX0 Maternal care for other (suspected) fetal abnormality and damage, not applicable or unspecified: Secondary | ICD-10-CM

## 2012-01-23 ENCOUNTER — Encounter (HOSPITAL_COMMUNITY): Payer: Self-pay

## 2012-01-26 ENCOUNTER — Encounter (INDEPENDENT_AMBULATORY_CARE_PROVIDER_SITE_OTHER): Payer: Medicaid Other | Admitting: Obstetrics and Gynecology

## 2012-01-26 ENCOUNTER — Encounter: Payer: Medicaid Other | Admitting: Obstetrics and Gynecology

## 2012-01-26 DIAGNOSIS — O4400 Placenta previa specified as without hemorrhage, unspecified trimester: Secondary | ICD-10-CM

## 2012-01-31 ENCOUNTER — Encounter: Payer: Medicaid Other | Admitting: Obstetrics and Gynecology

## 2012-02-01 ENCOUNTER — Other Ambulatory Visit: Payer: Self-pay | Admitting: Obstetrics and Gynecology

## 2012-02-01 DIAGNOSIS — IMO0002 Reserved for concepts with insufficient information to code with codable children: Secondary | ICD-10-CM

## 2012-02-02 ENCOUNTER — Encounter (HOSPITAL_COMMUNITY): Payer: Self-pay | Admitting: *Deleted

## 2012-02-02 ENCOUNTER — Other Ambulatory Visit: Payer: Medicaid Other

## 2012-02-02 ENCOUNTER — Inpatient Hospital Stay (HOSPITAL_COMMUNITY)
Admission: AD | Admit: 2012-02-02 | Discharge: 2012-02-02 | Disposition: A | Discharge status: Home / Self Care | Payer: Medicaid Other | Attending: Obstetrics and Gynecology | Admitting: Obstetrics and Gynecology

## 2012-02-02 ENCOUNTER — Encounter (INDEPENDENT_AMBULATORY_CARE_PROVIDER_SITE_OTHER): Payer: Medicaid Other | Admitting: Obstetrics and Gynecology

## 2012-02-02 DIAGNOSIS — Z3480 Encounter for supervision of other normal pregnancy, unspecified trimester: Secondary | ICD-10-CM

## 2012-02-02 DIAGNOSIS — Z331 Pregnant state, incidental: Secondary | ICD-10-CM

## 2012-02-02 DIAGNOSIS — O4410 Placenta previa with hemorrhage, unspecified trimester: Secondary | ICD-10-CM | POA: Insufficient documentation

## 2012-02-02 DIAGNOSIS — R109 Unspecified abdominal pain: Secondary | ICD-10-CM | POA: Insufficient documentation

## 2012-02-02 HISTORY — DX: Complete placenta previa nos or without hemorrhage, unspecified trimester: O44.00

## 2012-02-02 LAB — URINALYSIS, ROUTINE W REFLEX MICROSCOPIC
Bilirubin Urine: NEGATIVE
Glucose, UA: NEGATIVE mg/dL
Hgb urine dipstick: NEGATIVE
Ketones, ur: NEGATIVE mg/dL
Leukocytes, UA: NEGATIVE
Nitrite: NEGATIVE
Protein, ur: NEGATIVE mg/dL
Specific Gravity, Urine: >1.03 — ABNORMAL HIGH (ref 1.005–1.030)
Urobilinogen, UA: 0.2 mg/dL (ref 0.0–1.0)
pH: 5.5 (ref 5.0–8.0)

## 2012-02-02 MED ORDER — CYCLOBENZAPRINE HCL 5 MG PO TABS: 10.0000 mg | ORAL_TABLET | Freq: Three times a day (TID) | ORAL | Status: AC | PRN

## 2012-02-02 MED ORDER — PANTOPRAZOLE SODIUM 40 MG PO TBEC: 40.0000 mg | DELAYED_RELEASE_TABLET | Freq: Every day | ORAL | Status: DC

## 2012-02-02 MED ORDER — CYCLOBENZAPRINE HCL 10 MG PO TABS
10.0000 mg | ORAL_TABLET | Freq: Once | ORAL | Status: AC
  Administered 2012-02-02: 10 mg via ORAL
  Filled 2012-02-02: qty 1

## 2012-02-02 MED ORDER — PANTOPRAZOLE SODIUM 40 MG PO TBEC
40.0000 mg | DELAYED_RELEASE_TABLET | Freq: Once | ORAL | Status: AC
  Administered 2012-02-02: 40 mg via ORAL
  Filled 2012-02-02: qty 1

## 2012-02-02 NOTE — [facility] Note (Signed)
Sharp abdominal pain since 10pm. No contractions or vaginal bleeding. Good fetal movement

## 2012-02-02 NOTE — Discharge Instructions (Signed)
Fetal Movement Counts Patient Name: __________________________________________________ Patient Due Date: ____________________ Kick counts is highly recommended in high risk pregnancies, but it is a good idea for every pregnant woman to do. Start counting fetal movements at 28 weeks of the pregnancy. Fetal movements increase after eating a full meal or eating or drinking something sweet (the blood sugar is higher). It is also important to drink plenty of fluids (well hydrated) before doing the count. Lie on your left side because it helps with the circulation or you can sit in a comfortable chair with your arms over your belly (abdomen) with no distractions around you. DOING THE COUNT  Try to do the count the same time of day each time you do it.   Mark the day and time, then see how long it takes for you to feel 10 movements (kicks, flutters, swishes, rolls). You should have at least 10 movements within 2 hours. You will most likely feel 10 movements in much less than 2 hours. If you do not, wait an hour and count again. After a couple of days you will see a pattern.   What you are looking for is a change in the pattern or not enough counts in 2 hours. Is it taking longer in time to reach 10 movements?  SEEK MEDICAL CARE IF:  You feel less than 10 counts in 2 hours. Tried twice.   No movement in one hour.   The pattern is changing or taking longer each day to reach 10 counts in 2 hours.   You feel the baby is not moving as it usually does.  Date: ____________ Movements: ____________ Start time: ____________ Finish time: ____________  Date: ____________ Movements: ____________ Start time: ____________ Finish time: ____________ Date: ____________ Movements: ____________ Start time: ____________ Finish time: ____________ Date: ____________ Movements: ____________ Start time: ____________ Finish time: ____________ Date: ____________ Movements: ____________ Start time: ____________ Finish time:  ____________ Date: ____________ Movements: ____________ Start time: ____________ Finish time: ____________ Date: ____________ Movements: ____________ Start time: ____________ Finish time: ____________ Date: ____________ Movements: ____________ Start time: ____________ Finish time: ____________  Date: ____________ Movements: ____________ Start time: ____________ Finish time: ____________ Date: ____________ Movements: ____________ Start time: ____________ Finish time: ____________ Date: ____________ Movements: ____________ Start time: ____________ Finish time: ____________ Date: ____________ Movements: ____________ Start time: ____________ Finish time: ____________ Date: ____________ Movements: ____________ Start time: ____________ Finish time: ____________ Date: ____________ Movements: ____________ Start time: ____________ Finish time: ____________ Date: ____________ Movements: ____________ Start time: ____________ Finish time: ____________  Date: ____________ Movements: ____________ Start time: ____________ Finish time: ____________ Date: ____________ Movements: ____________ Start time: ____________ Finish time: ____________ Date: ____________ Movements: ____________ Start time: ____________ Finish time: ____________ Date: ____________ Movements: ____________ Start time: ____________ Finish time: ____________ Date: ____________ Movements: ____________ Start time: ____________ Finish time: ____________ Date: ____________ Movements: ____________ Start time: ____________ Finish time: ____________ Date: ____________ Movements: ____________ Start time: ____________ Finish time: ____________  Date: ____________ Movements: ____________ Start time: ____________ Finish time: ____________ Date: ____________ Movements: ____________ Start time: ____________ Finish time: ____________ Date: ____________ Movements: ____________ Start time: ____________ Finish time: ____________ Date: ____________ Movements:  ____________ Start time: ____________ Finish time: ____________ Date: ____________ Movements: ____________ Start time: ____________ Finish time: ____________ Date: ____________ Movements: ____________ Start time: ____________ Finish time: ____________ Date: ____________ Movements: ____________ Start time: ____________ Finish time: ____________  Date: ____________ Movements: ____________ Start time: ____________ Finish time: ____________ Date: ____________ Movements: ____________ Start time: ____________ Finish time: ____________ Date: ____________ Movements: ____________ Start time:   ____________ Finish time: ____________ Date: ____________ Movements: ____________ Start time: ____________ Finish time: ____________ Date: ____________ Movements: ____________ Start time: ____________ Finish time: ____________ Date: ____________ Movements: ____________ Start time: ____________ Finish time: ____________ Date: ____________ Movements: ____________ Start time: ____________ Finish time: ____________  Date: ____________ Movements: ____________ Start time: ____________ Finish time: ____________ Date: ____________ Movements: ____________ Start time: ____________ Finish time: ____________ Date: ____________ Movements: ____________ Start time: ____________ Finish time: ____________ Date: ____________ Movements: ____________ Start time: ____________ Finish time: ____________ Date: ____________ Movements: ____________ Start time: ____________ Finish time: ____________ Date: ____________ Movements: ____________ Start time: ____________ Finish time: ____________ Date: ____________ Movements: ____________ Start time: ____________ Finish time: ____________  Date: ____________ Movements: ____________ Start time: ____________ Finish time: ____________ Date: ____________ Movements: ____________ Start time: ____________ Finish time: ____________ Date: ____________ Movements: ____________ Start time: ____________ Finish  time: ____________ Date: ____________ Movements: ____________ Start time: ____________ Finish time: ____________ Date: ____________ Movements: ____________ Start time: ____________ Finish time: ____________ Date: ____________ Movements: ____________ Start time: ____________ Finish time: ____________ Date: ____________ Movements: ____________ Start time: ____________ Finish time: ____________  Date: ____________ Movements: ____________ Start time: ____________ Finish time: ____________ Date: ____________ Movements: ____________ Start time: ____________ Finish time: ____________ Date: ____________ Movements: ____________ Start time: ____________ Finish time: ____________ Date: ____________ Movements: ____________ Start time: ____________ Finish time: ____________ Date: ____________ Movements: ____________ Start time: ____________ Finish time: ____________ Date: ____________ Movements: ____________ Start time: ____________ Finish time: ____________ Document Released: 11/30/2006 Document Revised: 10/20/2011 Document Reviewed: 06/02/2009 Exit[employer] Patient Information 2012 [employer]. Preterm Labor Preterm labor is when labor starts at less than 37 weeks of pregnancy. The normal length of a pregnancy is 39 to 41 weeks. CAUSES Often, there is no identifiable underlying cause as to why a woman goes into preterm labor. However, one of the most common known causes of preterm labor is infection. Infections of the uterus, cervix, vagina, amniotic sac, bladder, kidney, or even the lungs (pneumonia) can cause labor to start. Other causes of preterm labor include:  Urogenital infections, such as yeast infections and bacterial vaginosis.   Uterine abnormalities (uterine shape, uterine septum, fibroids, bleeding from the placenta).   A cervix that has been operated on and opens prematurely.   Malformations in the baby.   Multiple gestations (twins, triplets, and so on).   Breakage of the amniotic sac.   Additional risk factors for preterm labor include:  Previous history of preterm labor.   Premature rupture of membranes (PROM).   A placenta that covers the opening of the cervix (placenta previa).   A placenta that separates from the uterus (placenta abruption).   A cervix that is too weak to hold the baby in the uterus (incompetence cervix).   Having too much fluid in the amniotic sac (polyhydramnios).   Taking illegal drugs or smoking while pregnant.   Not gaining enough weight while pregnant.   Women younger than 18 and older than 32 years old.   Low socioeconomic status.   African-American ethnicity.  SYMPTOMS Signs and symptoms of preterm labor include:  Menstrual-like cramps.   Contractions that are 30 to 70 seconds apart, become very regular, closer together, and are more intense and painful.   Contractions that start on the top of the uterus and spread down to the lower abdomen and back.   A sense of increased pelvic pressure or back pain.   A watery or bloody discharge that comes from the vagina.  DIAGNOSIS  A diagnosis can be confirmed by:    A vaginal exam.   An ultrasound of the cervix.   Sampling (swabbing) cervico-vaginal secretions. These samples can be tested for the presence of fetal fibronectin. This is a protein found in cervical discharge which is associated with preterm labor.   Fetal monitoring.  TREATMENT  Depending on the length of the pregnancy and other circumstances, a caregiver may suggest bed rest. If necessary, there are medicines that can be given to stop contractions and to quicken fetal lung maturity. If labor happens before 34 weeks of pregnancy, a prolonged hospital stay may be recommended. Treatment depends on the condition of both the mother and baby. PREVENTION There are some things a mother can do to lower the risk of preterm labor in future pregnancies. A woman can:   Stop smoking.   Maintain healthy weight gain and avoid  chemicals and drugs that are not necessary.   Be watchful for any type of infection.   Inform her caregiver if she has a known history of preterm labor.  Document Released: 01/21/2004 Document Revised: 10/20/2011 Document Reviewed: 02/25/2011 Exit[employer] Patient Information 2012 [employer]. 

## 2012-02-02 NOTE — [facility] Provider Note (Signed)
History     CSN: [id number]  Arrival date and time: 02/02/12 0006   First Provider Initiated Contact with Patient 02/02/12 0135      Chief Complaint  Patient presents with  . Abdominal Pain   HPI Comments: Pt is a G3P1102 at [redacted]w[redacted]d via EMS with cc of abdominal pain that started about 2300 with a persistent quality, unrelieved with rest and taking warm bath. Denies ctx, no VB, no LOF, GFM. Took tylenol about midnight.  Pregnancy significant for: 1.  Previous c/s for placenta previa 2.  Previous PTD secondary to previa 3.  Fetal Gastroschisis this pregnancy, planned C/S on 4-1 4.  Smoker 5.  H/o fibroids 6.  H/o stds 7.  Migraines 8.  Depression/anxiety/ADHD 9.  H/o intermenstrual bleeding 10.  Life stressors 11.  Transportation issues   Abdominal Pain     Past Medical History  Diagnosis Date  . Preterm labor   . Anxiety   . Depression   . Migraines   . Placenta previa     Past Surgical History  Procedure Date  . Cesarean section     Family History  Problem Relation Age of Onset  . Cancer Mother   . Hypertension Sister   . Sickle cell trait Daughter   . Asthma Son   . Asthma Paternal Grandmother   . Pseudochol deficiency Neg Hx   . Malignant hyperthermia Neg Hx   . Anesthesia problems Neg Hx   . Hypotension Neg Hx     History  Substance Use Topics  . Smoking status: Current Everyday Smoker -- 0.2 packs/day for 12 years    Types: Cigarettes  . Smokeless tobacco: Never Used  . Alcohol Use: No     PReviously drank on occ    Allergies: No Known Allergies  Prescriptions prior to admission  Medication Sig Dispense Refill  . Prenatal Vit-Fe Fumarate-FA (PRENATAL MULTIVITAMIN) TABS Take 1 tablet by mouth daily.        Review of Systems  Gastrointestinal: Positive for abdominal pain.       States pain is mid-abdomen,  constant, feels some relief sitting HF  Psychiatric/Behavioral: The patient is nervous/anxious.   All other systems reviewed and are  negative.   Physical Exam   Blood pressure 107/71, pulse 106, temperature 98.1 F (36.7 C), temperature source Oral, resp. rate 18, height 5\' 3"  (1.6 m), weight 86.183 kg (190 lb), last menstrual period 06/05/2011.  Physical Exam  Nursing note and vitals reviewed. Constitutional: She is oriented to person, place, and time. She appears well-developed and well-nourished. She appears distressed.       Appears anxious,   HENT:  Head: Normocephalic.  Eyes: Pupils are equal, round, and reactive to light.  Neck: Normal range of motion.  Cardiovascular: Normal rate.   Respiratory: Effort normal.  GI: Soft. There is tenderness. There is guarding. There is no rebound.       Diffuse pain, not localized, tho guarding with light touch  Genitourinary: No vaginal discharge found.       deferred  Musculoskeletal: Normal range of motion.  Neurological: She is alert and oriented to person, place, and time.  Skin: Skin is warm and dry.  Psychiatric: She has a normal mood and affect. Her behavior is normal.   FHR 130 reactive CAT 1 toco rare, some UI [facility]U Course  Procedures    Assessment and Plan  IUP at [redacted]w[redacted]d Complete previa, most recent US 3-14 was normal FHR reassuring  D/W Dr Pennie Rushing  Will give protonix and flexeril PO    Lathen Seal M 02/02/2012, 2:07 AM  3/21 at 0239  FHR remains 130 reactive toco quiet UA neg - concentrated  Pt feels better Has f/u appt with Dr Su Hilt today at 1045 and f/u US here with MFM on Friday at 1500. Offered pt to reschedule with Dr Su Hilt next week, pt feels anxious she would not meet her until her scheduled C/S would like to keep scheduled appt  D/C home [facility], labor precautions, continued pelvic rest PO hydrate

## 2012-02-02 NOTE — Progress Notes (Signed)
Patient arrived c/o sharp lower abdominal pain. No contractions, bleeding or leaking of fluid. Complete previa per patient. Orders to monitor.

## 2012-02-03 ENCOUNTER — Ambulatory Visit (HOSPITAL_COMMUNITY): Admission: RE | Admit: 2012-02-03 | Payer: Medicaid Other | Source: Ambulatory Visit

## 2012-02-03 ENCOUNTER — Ambulatory Visit (HOSPITAL_COMMUNITY): Payer: Medicaid Other

## 2012-02-03 ENCOUNTER — Ambulatory Visit (HOSPITAL_COMMUNITY)
Admission: RE | Admit: 2012-02-03 | Discharge: 2012-02-03 | Disposition: A | Discharge status: Home / Self Care | Payer: Medicaid Other | Source: Ambulatory Visit | Attending: Obstetrics and Gynecology | Admitting: Obstetrics and Gynecology

## 2012-02-03 ENCOUNTER — Other Ambulatory Visit: Payer: Self-pay | Admitting: Obstetrics and Gynecology

## 2012-02-03 DIAGNOSIS — O34219 Maternal care for unspecified type scar from previous cesarean delivery: Secondary | ICD-10-CM | POA: Insufficient documentation

## 2012-02-03 DIAGNOSIS — IMO0002 Reserved for concepts with insufficient information to code with codable children: Secondary | ICD-10-CM

## 2012-02-03 DIAGNOSIS — O358XX0 Maternal care for other (suspected) fetal abnormality and damage, not applicable or unspecified: Secondary | ICD-10-CM | POA: Insufficient documentation

## 2012-02-03 DIAGNOSIS — O4400 Placenta previa specified as without hemorrhage, unspecified trimester: Secondary | ICD-10-CM | POA: Insufficient documentation

## 2012-02-03 DIAGNOSIS — Z8751 Personal history of pre-term labor: Secondary | ICD-10-CM | POA: Insufficient documentation

## 2012-02-03 DIAGNOSIS — O99330 Smoking (tobacco) complicating pregnancy, unspecified trimester: Secondary | ICD-10-CM | POA: Insufficient documentation

## 2012-02-06 ENCOUNTER — Encounter (HOSPITAL_COMMUNITY): Payer: Self-pay

## 2012-02-07 ENCOUNTER — Encounter (INDEPENDENT_AMBULATORY_CARE_PROVIDER_SITE_OTHER): Payer: Medicaid Other

## 2012-02-07 ENCOUNTER — Encounter (INDEPENDENT_AMBULATORY_CARE_PROVIDER_SITE_OTHER): Payer: Medicaid Other | Admitting: Obstetrics and Gynecology

## 2012-02-07 ENCOUNTER — Encounter (HOSPITAL_COMMUNITY)
Admission: RE | Admit: 2012-02-07 | Discharge: 2012-02-07 | Disposition: A | Discharge status: Home / Self Care | Payer: Medicaid Other | Source: Ambulatory Visit | Attending: Obstetrics and Gynecology | Admitting: Obstetrics and Gynecology

## 2012-02-07 ENCOUNTER — Encounter (HOSPITAL_COMMUNITY): Payer: Self-pay

## 2012-02-07 DIAGNOSIS — Z331 Pregnant state, incidental: Secondary | ICD-10-CM

## 2012-02-07 DIAGNOSIS — O34219 Maternal care for unspecified type scar from previous cesarean delivery: Secondary | ICD-10-CM

## 2012-02-07 DIAGNOSIS — N898 Other specified noninflammatory disorders of vagina: Secondary | ICD-10-CM

## 2012-02-07 DIAGNOSIS — O4400 Placenta previa specified as without hemorrhage, unspecified trimester: Secondary | ICD-10-CM

## 2012-02-07 DIAGNOSIS — O358XX0 Maternal care for other (suspected) fetal abnormality and damage, not applicable or unspecified: Secondary | ICD-10-CM

## 2012-02-07 LAB — CBC
HCT: 34.3 % — ABNORMAL LOW (ref 36.0–46.0)
Hemoglobin: 11.5 g/dL — ABNORMAL LOW (ref 12.0–15.0)
MCH: 27.4 pg (ref 26.0–34.0)
MCHC: 33.5 g/dL (ref 30.0–36.0)
MCV: 81.9 fL (ref 78.0–100.0)
Platelets: 246 10*3/uL (ref 150–400)
RBC: 4.19 MIL/uL (ref 3.87–5.11)
RDW: 14 % (ref 11.5–15.5)
WBC: 12.7 10*3/uL — ABNORMAL HIGH (ref 4.0–10.5)

## 2012-02-07 LAB — RPR: RPR Ser Ql: NONREACTIVE

## 2012-02-07 LAB — SURGICAL PCR SCREEN
MRSA, PCR: NEGATIVE
Staphylococcus aureus: NEGATIVE

## 2012-02-07 NOTE — Anesthesia Preprocedure Evaluation (Addendum)
Anesthesia Evaluation  Patient identified by MRN, date of birth, ID band Patient awake    Reviewed: Allergy & Precautions, H&P , Patient's Chart, lab work & pertinent test results, Unable to perform ROS - Chart review only  Airway Mallampati: I TM Distance: >3 FB Neck ROM: full    Dental No notable dental hx.    Pulmonary  breath sounds clear to auscultation  Pulmonary exam normal       Cardiovascular Exercise Tolerance: Good Rhythm:regular Rate:Normal     Neuro/Psych    GI/Hepatic   Endo/Other    Renal/GU      Musculoskeletal   Abdominal   Peds  Hematology   Anesthesia Other Findings Placenta Previa  Reproductive/Obstetrics                          Anesthesia Physical Anesthesia Plan  ASA: II  Anesthesia Plan: Spinal   Post-op Pain Management:    Induction:   Airway Management Planned:   Additional Equipment:   Intra-op Plan:   Post-operative Plan:   Informed Consent: I have reviewed the patients History and Physical, chart, labs and discussed the procedure including the risks, benefits and alternatives for the proposed anesthesia with the patient or authorized representative who has indicated his/her understanding and acceptance.   Dental Advisory Given  Plan Discussed with: CRNA  Anesthesia Plan Comments: (Multiple questions answered, in excess of 20 minutes answering her concerns. Patient had a parasthesia with previous epidural. We will plan on 2 18GA IV's, and type and cross x2 units.  Lab work confirmed with CRNA in room. Platelets okay. Discussed combined epidural/ spinal anesthetic, and patient consents to the procedure:  included risk of possible headache,backache, failed block, allergic reaction, and nerve injury. This patient was asked if she had any further questions or concerns before the procedure started. )      Anesthesia Quick Evaluation

## 2012-02-07 NOTE — Patient Instructions (Signed)
   Your procedure is scheduled on:  Monday, April 1st  Enter through the Main Entrance of [facility] at: 9:30am Pick up the phone at the desk and dial [phone] and inform us of your arrival.  Please call this number if you have any problems the morning of surgery: [phone]  Remember: Do not eat food after midnight: Sunday Do not drink clear liquids after: Sunday Take these medicines the morning of surgery with a SIP OF WATER: None  Do not wear jewelry, make-up, or FINGER nail polish Do not wear lotions, powders, perfumes or deodorant. Do not shave 48 hours prior to surgery. Do not bring valuables to the hospital. Contacts, dentures or bridgework may not be worn into surgery.  Leave suitcase in the car. After Surgery it may be brought to your room. For patients being admitted to the hospital, checkout time is 11:00am the day of discharge.  Patient to arrange ride home prior to discharge from hospital. Boyfriend Junie Panning or [place].   Patients discharged on the day of surgery will not be allowed to drive home.     Remember to use your hibiclens as instructed.Please shower with 1/2 bottle the evening before your surgery and the other 1/2 bottle the morning of surgery. Neck down avoiding private area.

## 2012-02-11 ENCOUNTER — Other Ambulatory Visit: Payer: Self-pay | Admitting: Obstetrics and Gynecology

## 2012-02-11 NOTE — H&P (Signed)
Penny [name] is a 31 y.o. female, G3P1102 at 20 weeks, presenting for scheduled repeat C/S and BTL, with fetus with gastroschisis, placenta previa, and SGA vs. IUGR infant. Anticipated plan is to deliver patient at [facility] and transfer baby to [facility] for surgical repair of the abdominal defect.  This plan has developed in consultation with [facility] and Penny [name], since the patient strongly desired delivery in [place] due to the arrangements required for her other children.  Pregnancy remarkable for: Previous C/S due to previa, with prior vaginal delivery Fetus with gastroschisis Placenta previa SGA vs IUGR infant Desires BTL, with consent signed 12/20/11  History of present pregnancy: Patient entered care at 10 2/7 weeks.  EDC of 03/11/12 was established by LMP and in agreement with 1st trimester [place].  Suspected gastroschisis was noted at 12 weeks. She was referred to MFM at that time.   Another [place] was done at 14 weeks, with posterior previa noted at that time.  She had some palpitations at 14-16 weeks, and was referred to cardiology, with normal echo noted.  Initial plans were for a local pediatric surgeon to perform the gastroschisis repair in [place], but plans changed in the 3rd trimester due to anesthesia concerns about the surgery in [place].  Arrangements were then made for the patient to be transferred to MFM at [facility] for delivery, allowing for the baby to receive surgery upon transfer to [facility].  However, the patient strongly desired delivery in [place], due to necessary arrangements for her other children.  Therefore, final plans were made for her C/S to be done by Penny [name] at [facility], then the baby will be transferred to [facility] for surgery.  The baby's growth, fluid, and BPPs were followed throughout the rest of the pregnancy.  Doppler flow remained normal, although fetal growth was noted to be consistent with SGA.  Patient was followed  by Penny [name] and MFM with serial USs during pregnancy.  1 hour early glucola was 136, with normal 3 hour GTT, then follow-up glucola was WNL at 28 weeks.  Patient was struggling with stress and depression at 28 weeks, and was placed on Zoloft.  BTL consent was signed at 28 weeks.   OB History    Grav Para Term Preterm Abortions TAB SAB Ect Mult Living   3 2 1 1  0 0 0 0 0 2    #1--2003--SVB female infant, 6+12 at 40 weeks.  No complications.  Delivered in [place].  Positive GBS #2--2010 Primary LTCS for previa at 36 weeks.  Female 5+10. Delivered in [place].  Had blood clot between placenta and cervix.  Positive GBS #3--Current   Past Medical History  Diagnosis Date  . Preterm labor     c/s at 36 wks  . Placenta previa     hx and with current pregnancy  . Migraines     last one over 1 month ago  . Anxiety     no meds  . Depression     no meds  Remote hx of STD in 2003-2005.   Past Surgical History  Procedure Date  . Cesarean section 2010    x 1 at 36 wks in [place]  . Svd 2003    x 1 in [place]  . Wisdom tooth extraction    Family History: family history includes Asthma in her paternal grandmother and son; Cancer in her mother; Hypertension in her sister; and Sickle cell trait in her daughter.  There is no history of Pseudochol  deficiency, and Malignant hyperthermia, and Anesthesia problems, and Hypotension, .  Social History:  reports that she has been smoking Cigarettes.  She has a 3 pack-year smoking history. She has never used smokeless tobacco. She reports that she does not drink alcohol or use illicit drugs.  FOB is involved and supportive, Penny [name].  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  Currently unemployed.  FOB has a high school education, employed at [employer].  ROS:  Reports occasional contractions, +FM.  No leaking or bleeding   Prenatal labs: ABO, Rh: --/--/B POS (07/23 2237) Antibody: NEG (07/23 2230) Rubella:  Immune RPR:  NON REACTIVE (03/26 0955)  HBsAg: Negative (09/12 0000)  HIV: Non-reactive (09/12 0000)  GBS:    1st trimester screen WNL Initial glucola elevated at 136, normal 3 hour GTT--then normal repeat 28 weeks  Physical Exam: Chest clear  Heart RRR without murmur Abd soft, gravid, NT Pelvic deferred Ext WNL  FHR 150s by doppler.  Assessment/Plan: IUP at 36 weeks Fetal gastroschisis Placenta previa SGA vs IUGR Previous C/S, desires repeat Desires BTL  Plan: Admitted to [facility] per consult with Penny [name] Routine MD pre-op orders Arrangements made with [facility] for baby's transfer to [facility] immediately after delivery. Support to patient for fetal/infant issues.   Penny [name] 02/11/2012, 8:33 PM

## 2012-02-13 ENCOUNTER — Encounter (HOSPITAL_COMMUNITY): Payer: Self-pay | Admitting: *Deleted

## 2012-02-13 ENCOUNTER — Inpatient Hospital Stay (HOSPITAL_COMMUNITY): Payer: Medicaid Other | Admitting: Anesthesiology

## 2012-02-13 ENCOUNTER — Inpatient Hospital Stay (HOSPITAL_COMMUNITY)
Admission: RE | Admit: 2012-02-13 | Discharge: 2012-02-15 | DRG: 765 | Disposition: A | Discharge status: Home / Self Care | Payer: Medicaid Other | Source: Ambulatory Visit | Attending: Obstetrics and Gynecology | Admitting: Obstetrics and Gynecology

## 2012-02-13 ENCOUNTER — Encounter (HOSPITAL_COMMUNITY)
Admission: RE | Disposition: A | Discharge status: Home / Self Care | Payer: Self-pay | Source: Ambulatory Visit | Attending: Obstetrics and Gynecology

## 2012-02-13 ENCOUNTER — Encounter (HOSPITAL_COMMUNITY): Payer: Self-pay | Admitting: Anesthesiology

## 2012-02-13 DIAGNOSIS — O4410 Placenta previa with hemorrhage, unspecified trimester: Principal | ICD-10-CM | POA: Diagnosis present

## 2012-02-13 DIAGNOSIS — O328XX0 Maternal care for other malpresentation of fetus, not applicable or unspecified: Secondary | ICD-10-CM | POA: Diagnosis present

## 2012-02-13 DIAGNOSIS — O4400 Placenta previa specified as without hemorrhage, unspecified trimester: Secondary | ICD-10-CM

## 2012-02-13 DIAGNOSIS — O34219 Maternal care for unspecified type scar from previous cesarean delivery: Secondary | ICD-10-CM | POA: Diagnosis present

## 2012-02-13 DIAGNOSIS — O358XX0 Maternal care for other (suspected) fetal abnormality and damage, not applicable or unspecified: Secondary | ICD-10-CM | POA: Diagnosis present

## 2012-02-13 DIAGNOSIS — O365990 Maternal care for other known or suspected poor fetal growth, unspecified trimester, not applicable or unspecified: Secondary | ICD-10-CM | POA: Diagnosis present

## 2012-02-13 DIAGNOSIS — Z01818 Encounter for other preprocedural examination: Secondary | ICD-10-CM

## 2012-02-13 DIAGNOSIS — Z302 Encounter for sterilization: Secondary | ICD-10-CM

## 2012-02-13 DIAGNOSIS — Z01812 Encounter for preprocedural laboratory examination: Secondary | ICD-10-CM

## 2012-02-13 LAB — PREPARE RBC (CROSSMATCH)

## 2012-02-13 LAB — ABO/RH: ABO/RH(D): B POS

## 2012-02-13 SURGERY — Surgical Case
Anesthesia: Spinal | Site: Abdomen | Laterality: Bilateral | Wound class: Clean Contaminated

## 2012-02-13 MED ORDER — SODIUM CHLORIDE 0.9 % IJ SOLN
3.0000 mL | INTRAMUSCULAR | Status: DC | PRN
  Administered 2012-02-14: 3 mL via INTRAVENOUS

## 2012-02-13 MED ORDER — PANTOPRAZOLE SODIUM 40 MG PO TBEC
40.0000 mg | DELAYED_RELEASE_TABLET | Freq: Every day | ORAL | Status: DC
  Administered 2012-02-14 – 2012-02-15 (×2): 40 mg via ORAL
  Filled 2012-02-13 (×4): qty 1

## 2012-02-13 MED ORDER — HYDROMORPHONE HCL PF 1 MG/ML IJ SOLN
INTRAMUSCULAR | Status: AC
  Filled 2012-02-13: qty 1

## 2012-02-13 MED ORDER — HYDROMORPHONE HCL PF 1 MG/ML IJ SOLN
0.2500 mg | INTRAMUSCULAR | Status: DC | PRN
  Administered 2012-02-13 (×2): 0.5 mg via INTRAVENOUS

## 2012-02-13 MED ORDER — OXYTOCIN 10 UNIT/ML IJ SOLN
INTRAMUSCULAR | Status: DC | PRN
  Administered 2012-02-13: 20 [IU]

## 2012-02-13 MED ORDER — LACTATED RINGERS IV SOLN
INTRAVENOUS | Status: DC
  Administered 2012-02-13 (×3): via INTRAVENOUS
  Administered 2012-02-13: 125 mL/h via INTRAVENOUS
  Administered 2012-02-13: 10:00:00 via INTRAVENOUS

## 2012-02-13 MED ORDER — BUPIVACAINE HCL (PF) 0.5 % IJ SOLN
INTRAMUSCULAR | Status: AC
  Filled 2012-02-13: qty 30

## 2012-02-13 MED ORDER — OXYTOCIN 20 UNITS IN LACTATED RINGERS INFUSION - SIMPLE
125.0000 mL/h | INTRAVENOUS | Status: AC
  Filled 2012-02-13: qty 1000

## 2012-02-13 MED ORDER — SIMETHICONE 80 MG PO CHEW: 80.0000 mg | CHEWABLE_TABLET | ORAL | Status: DC | PRN

## 2012-02-13 MED ORDER — SCOPOLAMINE 1 MG/3DAYS TD PT72
1.0000 | MEDICATED_PATCH | Freq: Once | TRANSDERMAL | Status: DC
  Filled 2012-02-13: qty 1

## 2012-02-13 MED ORDER — FENTANYL CITRATE 0.05 MG/ML IJ SOLN
INTRAMUSCULAR | Status: DC | PRN
  Administered 2012-02-13: 100 ug via INTRAVENOUS

## 2012-02-13 MED ORDER — NALBUPHINE HCL 10 MG/ML IJ SOLN
5.0000 mg | INTRAMUSCULAR | Status: DC | PRN
  Filled 2012-02-13: qty 1

## 2012-02-13 MED ORDER — KETOROLAC TROMETHAMINE 30 MG/ML IJ SOLN
30.0000 mg | Freq: Four times a day (QID) | INTRAMUSCULAR | Status: AC | PRN
  Administered 2012-02-13: 30 mg via INTRAVENOUS
  Filled 2012-02-13: qty 1

## 2012-02-13 MED ORDER — ONDANSETRON HCL 4 MG PO TABS: 4.0000 mg | ORAL_TABLET | ORAL | Status: DC | PRN

## 2012-02-13 MED ORDER — SCOPOLAMINE 1 MG/3DAYS TD PT72
1.0000 | MEDICATED_PATCH | Freq: Once | TRANSDERMAL | Status: DC
  Administered 2012-02-13: 1.5 mg via TRANSDERMAL

## 2012-02-13 MED ORDER — MEPERIDINE HCL 25 MG/ML IJ SOLN: 6.2500 mg | INTRAMUSCULAR | Status: DC | PRN

## 2012-02-13 MED ORDER — OXYTOCIN 20 UNITS IN LACTATED RINGERS INFUSION - SIMPLE
INTRAVENOUS | Status: AC
  Filled 2012-02-13: qty 1000

## 2012-02-13 MED ORDER — MEASLES, MUMPS & RUBELLA VAC ~~LOC~~ INJ
0.5000 mL | INJECTION | Freq: Once | SUBCUTANEOUS | Status: DC
  Filled 2012-02-13: qty 0.5

## 2012-02-13 MED ORDER — CYCLOBENZAPRINE HCL 10 MG PO TABS
10.0000 mg | ORAL_TABLET | Freq: Three times a day (TID) | ORAL | Status: DC | PRN
  Filled 2012-02-13: qty 1

## 2012-02-13 MED ORDER — SIMETHICONE 80 MG PO CHEW
80.0000 mg | CHEWABLE_TABLET | Freq: Three times a day (TID) | ORAL | Status: DC
  Administered 2012-02-13 – 2012-02-15 (×6): 80 mg via ORAL

## 2012-02-13 MED ORDER — ONDANSETRON HCL 4 MG/2ML IJ SOLN: 4.0000 mg | Freq: Three times a day (TID) | INTRAMUSCULAR | Status: DC | PRN

## 2012-02-13 MED ORDER — MORPHINE SULFATE 0.5 MG/ML IJ SOLN
INTRAMUSCULAR | Status: AC
  Filled 2012-02-13: qty 10

## 2012-02-13 MED ORDER — HYDROMORPHONE HCL PF 1 MG/ML IJ SOLN
INTRAMUSCULAR | Status: DC | PRN
  Administered 2012-02-13 (×2): 1 mg via INTRAVENOUS

## 2012-02-13 MED ORDER — PRENATAL MULTIVITAMIN CH
1.0000 | ORAL_TABLET | Freq: Every day | ORAL | Status: DC
  Administered 2012-02-14 – 2012-02-15 (×2): 1 via ORAL
  Filled 2012-02-13 (×2): qty 1

## 2012-02-13 MED ORDER — DIPHENHYDRAMINE HCL 50 MG/ML IJ SOLN: 25.0000 mg | INTRAMUSCULAR | Status: DC | PRN

## 2012-02-13 MED ORDER — DIPHENHYDRAMINE HCL 50 MG/ML IJ SOLN: 12.5000 mg | INTRAMUSCULAR | Status: DC | PRN

## 2012-02-13 MED ORDER — MORPHINE SULFATE (PF) 0.5 MG/ML IJ SOLN
INTRAMUSCULAR | Status: DC | PRN
  Administered 2012-02-13: 2 mg via INTRAVENOUS

## 2012-02-13 MED ORDER — BISACODYL 10 MG RE SUPP: 10.0000 mg | Freq: Every day | RECTAL | Status: DC | PRN

## 2012-02-13 MED ORDER — CEFAZOLIN SODIUM 1-5 GM-% IV SOLN
INTRAVENOUS | Status: AC
  Filled 2012-02-13: qty 50

## 2012-02-13 MED ORDER — DIPHENHYDRAMINE HCL 25 MG PO CAPS: 25.0000 mg | ORAL_CAPSULE | Freq: Four times a day (QID) | ORAL | Status: DC | PRN

## 2012-02-13 MED ORDER — FLEET ENEMA 7-19 GM/118ML RE ENEM: 1.0000 | ENEMA | Freq: Every day | RECTAL | Status: DC | PRN

## 2012-02-13 MED ORDER — ONDANSETRON HCL 4 MG/2ML IJ SOLN
INTRAMUSCULAR | Status: DC | PRN
  Administered 2012-02-13: 4 mg via INTRAVENOUS

## 2012-02-13 MED ORDER — METOCLOPRAMIDE HCL 5 MG/ML IJ SOLN: 10.0000 mg | Freq: Three times a day (TID) | INTRAMUSCULAR | Status: DC | PRN

## 2012-02-13 MED ORDER — OXYTOCIN 10 UNIT/ML IJ SOLN
INTRAMUSCULAR | Status: AC
  Filled 2012-02-13: qty 2

## 2012-02-13 MED ORDER — IBUPROFEN 600 MG PO TABS
600.0000 mg | ORAL_TABLET | Freq: Four times a day (QID) | ORAL | Status: DC
  Administered 2012-02-13 – 2012-02-15 (×7): 600 mg via ORAL
  Filled 2012-02-13: qty 1

## 2012-02-13 MED ORDER — PHENYLEPHRINE HCL 10 MG/ML IJ SOLN
INTRAMUSCULAR | Status: DC | PRN
  Administered 2012-02-13: 80 ug via INTRAVENOUS
  Administered 2012-02-13 (×2): 40 ug via INTRAVENOUS
  Administered 2012-02-13: 80 ug via INTRAVENOUS

## 2012-02-13 MED ORDER — CEFAZOLIN SODIUM 1-5 GM-% IV SOLN
1.0000 g | INTRAVENOUS | Status: AC
  Administered 2012-02-13: 1 g via INTRAVENOUS

## 2012-02-13 MED ORDER — DIPHENHYDRAMINE HCL 25 MG PO CAPS
25.0000 mg | ORAL_CAPSULE | ORAL | Status: DC | PRN
  Administered 2012-02-13 – 2012-02-14 (×4): 25 mg via ORAL
  Filled 2012-02-13 (×4): qty 1

## 2012-02-13 MED ORDER — ZOLPIDEM TARTRATE 5 MG PO TABS: 5.0000 mg | ORAL_TABLET | Freq: Every evening | ORAL | Status: DC | PRN

## 2012-02-13 MED ORDER — LANOLIN HYDROUS EX OINT: 1.0000 "application " | TOPICAL_OINTMENT | CUTANEOUS | Status: DC | PRN

## 2012-02-13 MED ORDER — NALOXONE HCL 0.4 MG/ML IJ SOLN: 0.4000 mg | INTRAMUSCULAR | Status: DC | PRN

## 2012-02-13 MED ORDER — DIBUCAINE 1 % RE OINT: 1.0000 "application " | TOPICAL_OINTMENT | RECTAL | Status: DC | PRN

## 2012-02-13 MED ORDER — FENTANYL CITRATE 0.05 MG/ML IJ SOLN
INTRAMUSCULAR | Status: AC
  Filled 2012-02-13: qty 2

## 2012-02-13 MED ORDER — MENTHOL 3 MG MT LOZG: 1.0000 | LOZENGE | OROMUCOSAL | Status: DC | PRN

## 2012-02-13 MED ORDER — OXYCODONE-ACETAMINOPHEN 5-325 MG PO TABS
1.0000 | ORAL_TABLET | ORAL | Status: DC | PRN
  Administered 2012-02-14 (×4): 2 via ORAL
  Administered 2012-02-14: 1 via ORAL
  Administered 2012-02-15 (×3): 2 via ORAL
  Filled 2012-02-13 (×5): qty 2
  Filled 2012-02-13: qty 1
  Filled 2012-02-13 (×2): qty 2

## 2012-02-13 MED ORDER — IBUPROFEN 600 MG PO TABS
600.0000 mg | ORAL_TABLET | Freq: Four times a day (QID) | ORAL | Status: DC | PRN
  Filled 2012-02-13 (×5): qty 1

## 2012-02-13 MED ORDER — TETANUS-DIPHTH-ACELL PERTUSSIS 5-2.5-18.5 LF-MCG/0.5 IM SUSP
0.5000 mL | Freq: Once | INTRAMUSCULAR | Status: AC
  Administered 2012-02-14: 0.5 mL via INTRAMUSCULAR
  Filled 2012-02-13: qty 0.5

## 2012-02-13 MED ORDER — WITCH HAZEL-GLYCERIN EX PADS: 1.0000 "application " | MEDICATED_PAD | CUTANEOUS | Status: DC | PRN

## 2012-02-13 MED ORDER — MIDAZOLAM HCL 2 MG/2ML IJ SOLN
INTRAMUSCULAR | Status: AC
  Filled 2012-02-13: qty 2

## 2012-02-13 MED ORDER — HYDROMORPHONE HCL PF 1 MG/ML IJ SOLN
INTRAMUSCULAR | Status: AC
  Administered 2012-02-13: 0.5 mg via INTRAVENOUS
  Filled 2012-02-13: qty 1

## 2012-02-13 MED ORDER — KETOROLAC TROMETHAMINE 30 MG/ML IJ SOLN: 30.0000 mg | Freq: Four times a day (QID) | INTRAMUSCULAR | Status: AC | PRN

## 2012-02-13 MED ORDER — MORPHINE SULFATE (PF) 0.5 MG/ML IJ SOLN
INTRAMUSCULAR | Status: DC | PRN
  Administered 2012-02-13: 3 mg via EPIDURAL

## 2012-02-13 MED ORDER — 0.9 % SODIUM CHLORIDE (POUR BTL) OPTIME
TOPICAL | Status: DC | PRN
  Administered 2012-02-13: 1000 mL

## 2012-02-13 MED ORDER — NALOXONE HCL 0.4 MG/ML IJ SOLN
1.0000 ug/kg/h | INTRAMUSCULAR | Status: DC | PRN
  Filled 2012-02-13: qty 2.5

## 2012-02-13 MED ORDER — ONDANSETRON HCL 4 MG/2ML IJ SOLN
INTRAMUSCULAR | Status: AC
  Filled 2012-02-13: qty 2

## 2012-02-13 MED ORDER — BUPIVACAINE HCL (PF) 0.5 % IJ SOLN
INTRAMUSCULAR | Status: DC | PRN
  Administered 2012-02-13: 30 mL

## 2012-02-13 MED ORDER — KETOROLAC TROMETHAMINE 60 MG/2ML IM SOLN
60.0000 mg | Freq: Once | INTRAMUSCULAR | Status: AC | PRN
  Filled 2012-02-13: qty 2

## 2012-02-13 MED ORDER — SCOPOLAMINE 1 MG/3DAYS TD PT72
MEDICATED_PATCH | TRANSDERMAL | Status: AC
  Administered 2012-02-13: 1.5 mg via TRANSDERMAL
  Filled 2012-02-13: qty 1

## 2012-02-13 MED ORDER — FERROUS SULFATE 325 (65 FE) MG PO TABS
325.0000 mg | ORAL_TABLET | Freq: Two times a day (BID) | ORAL | Status: DC
  Administered 2012-02-14 – 2012-02-15 (×3): 325 mg via ORAL
  Filled 2012-02-13 (×3): qty 1

## 2012-02-13 MED ORDER — MIDAZOLAM HCL 5 MG/5ML IJ SOLN
INTRAMUSCULAR | Status: DC | PRN
  Administered 2012-02-13 (×2): 1 mg via INTRAVENOUS

## 2012-02-13 MED ORDER — LACTATED RINGERS IV SOLN
INTRAVENOUS | Status: DC
  Administered 2012-02-13: 19:00:00 via INTRAVENOUS

## 2012-02-13 MED ORDER — SENNOSIDES-DOCUSATE SODIUM 8.6-50 MG PO TABS
2.0000 | ORAL_TABLET | Freq: Every day | ORAL | Status: DC
  Administered 2012-02-13 – 2012-02-14 (×2): 2 via ORAL

## 2012-02-13 MED ORDER — ONDANSETRON HCL 4 MG/2ML IJ SOLN
4.0000 mg | INTRAMUSCULAR | Status: DC | PRN
  Administered 2012-02-13: 4 mg via INTRAVENOUS
  Filled 2012-02-13: qty 2

## 2012-02-13 SURGICAL SUPPLY — 39 items
BENZOIN TINCTURE PRP APPL 2/3 (GAUZE/BANDAGES/DRESSINGS) ×2 IMPLANT
CHLORAPREP W/TINT 26ML (MISCELLANEOUS) ×2 IMPLANT
CLIP FILSHIE TUBAL LIGA STRL (Clip) ×2 IMPLANT
CLOTH BEACON ORANGE TIMEOUT ST (SAFETY) ×2 IMPLANT
CONTAINER PREFILL 10% NBF 15ML (MISCELLANEOUS) IMPLANT
DRAIN JACKSON PRT FLT 10 (DRAIN) IMPLANT
DRESSING TELFA 8X3 (GAUZE/BANDAGES/DRESSINGS) ×2 IMPLANT
ELECT REM PT RETURN 9FT ADLT (ELECTROSURGICAL) ×2
ELECTRODE REM PT RTRN 9FT ADLT (ELECTROSURGICAL) ×1 IMPLANT
EVACUATOR SILICONE 100CC (DRAIN) IMPLANT
EXTRACTOR VACUUM M CUP 4 TUBE (SUCTIONS) IMPLANT
GLOVE BIO SURGEON STRL SZ 6.5 (GLOVE) ×2 IMPLANT
GLOVE BIOGEL PI IND STRL 7.0 (GLOVE) ×1 IMPLANT
GLOVE BIOGEL PI INDICATOR 7.0 (GLOVE) ×1
GOWN PREVENTION PLUS LG XLONG (DISPOSABLE) ×6 IMPLANT
KIT ABG SYR 3ML LUER SLIP (SYRINGE) IMPLANT
NEEDLE HYPO 25X5/8 SAFETYGLIDE (NEEDLE) IMPLANT
NS IRRIG 1000ML POUR BTL (IV SOLUTION) ×2 IMPLANT
PACK C SECTION WH (CUSTOM PROCEDURE TRAY) ×2 IMPLANT
PAD ABD 7.5X8 STRL (GAUZE/BANDAGES/DRESSINGS) ×2 IMPLANT
RTRCTR C-SECT PINK 25CM LRG (MISCELLANEOUS) IMPLANT
SLEEVE SCD COMPRESS KNEE MED (MISCELLANEOUS) IMPLANT
SPONGE GAUZE 4X4 12PLY (GAUZE/BANDAGES/DRESSINGS) ×2 IMPLANT
STAPLER VISISTAT 35W (STAPLE) IMPLANT
STRIP CLOSURE SKIN 1/2X4 (GAUZE/BANDAGES/DRESSINGS) ×2 IMPLANT
SUT CHROMIC 0 CT 1 (SUTURE) ×2 IMPLANT
SUT MNCRL AB 3-0 PS2 27 (SUTURE) IMPLANT
SUT PLAIN 2 0 (SUTURE) ×2
SUT PLAIN 2 0 XLH (SUTURE) ×2 IMPLANT
SUT PLAIN ABS 2-0 CT1 27XMFL (SUTURE) ×2 IMPLANT
SUT SILK 2 0 SH (SUTURE) IMPLANT
SUT VIC AB 0 CTX 36 (SUTURE) ×4
SUT VIC AB 0 CTX36XBRD ANBCTRL (SUTURE) ×4 IMPLANT
SUT VIC AB 2-0 SH 27 (SUTURE) ×1
SUT VIC AB 2-0 SH 27XBRD (SUTURE) ×1 IMPLANT
TAPE CLOTH SURG 4X10 WHT LF (GAUZE/BANDAGES/DRESSINGS) ×2 IMPLANT
TOWEL OR 17X24 6PK STRL BLUE (TOWEL DISPOSABLE) ×4 IMPLANT
TRAY FOLEY CATH 14FR (SET/KITS/TRAYS/PACK) ×2 IMPLANT
WATER STERILE IRR 1000ML POUR (IV SOLUTION) ×2 IMPLANT

## 2012-02-13 NOTE — Progress Notes (Signed)
SW met with patient in Short Stay to check in with her prior to delivery to provide support.  SW initially met patient during an appointment at MFM a couple months ago to offer support and assist with planning.  SW spoke to patient via phone almost weekly since that time.  Patient was in good spirits this morning, although nervous.  SW told her it was completely normal for her to feel nervous.  We discussed relaxation techniques.  SW asked if FOB was here somewhere.  She said she was by herself.  SW asked if she would like SW to stay with her throughout delivery and she said yes.  SW accompanied patient to operating room to provide support.  Patient asked SW to accompany baby to NICU, which SW did when patient was ready.  SW will continue to provide support and assistance as patient desires and will assist with arranging transportation to [facility] at patient's discharge in order for her to be with her baby.

## 2012-02-13 NOTE — Progress Notes (Signed)
Upon arrival to PACU, patient had epidural taped to her back.  Dressing clean, dry and intact.  Patient had combo marcaine spinal and epidural in the OR.  Epidural not documented in the OR.  Orders for Epidural to be pulled in pacu prior to d/c to floor.  Epidural pulled at 1420, blue tip intact, site U.  Patient tolerated procedure well.

## 2012-02-13 NOTE — Transfer of Care (Signed)
Immediate Anesthesia Transfer of Care Note  Patient: Penny [name]  Procedure(s) Performed: Procedure(s) (LRB): CESAREAN SECTION WITH BILATERAL TUBAL LIGATION (Bilateral)  Patient Location: PACU  Anesthesia Type: Spinal  Level of Consciousness: awake, alert  and oriented  Airway & Oxygen Therapy: Patient Spontanous Breathing  Post-op Assessment: Report given to PACU RN and Post -op Vital signs reviewed and stable  Post vital signs: stable  Complications: No apparent anesthesia complications

## 2012-02-13 NOTE — Op Note (Signed)
Cesarean Section Procedure Note   Penny [name]  02/13/2012  Indications: Scheduled Proceedure/Maternal Request and placenta previa, infant gastroschisis   Pre-operative Diagnosis: Placenta Previa; Infant with Gastroschisis; previous cesarean section. Footling breech presentation  Post-operative Diagnosis: Same   Surgeon Rhiana Morash  Assistants: Manfred Arch CNM and Dahlia Client MD  Anesthesia: CSE   Procedure Details:  The patient was seen in the Holding Room. The risks, benefits, complications, treatment options, and expected outcomes were discussed with the patient. The patient concurred with the proposed plan, giving informed consent. identified as Penny [name] and the procedure verified as C-Section Delivery. A Time Out was held and the above information confirmed.  After induction of anesthesia, the patient was draped and prepped in the usual sterile manner. A transverse incision was made and carried down through the subcutaneous tissue to the fascia. Fascial incision was made and extended transversely. The fascia was separated from the underlying rectus tissue superiorly and inferiorly. The peritoneum was identified and entered. Peritoneal incision was extended longitudinally. The utero-vesical peritoneal reflection was incised transversely and the bladder flap was bluntly freed from the lower uterine segment.  The alexis retractor was placed in the abdomen.   A low transverse uterine incision was made. Delivered from breech double footling presentation using breech manuvers was a 5-14 pound Female with Apgar scores of 8 at one minute and 9 at five minutes. Cord ph was ph 7.45 the umbilical cord was clamped and cut cord blood was obtained for evaluation. The placenta was removed Intact and appeared with an extra lobe. The uterine outline, tubes and ovaries appeared there was a small three cm fibroid on the anterior surface of the uterus}. The uterine incision was closed with running  locked sutures of 0Vicryl. A second layer of 0 vicryl was used to imbricate the uterus.  The patients left fallopian tube was grasped at the mid ishtmic portion with babcock clamp, ligated with filshie clip.  The patients right fallopian tube was followed out to the fimbriated end.  The mid isthmic portion of the tube was ligated with filshie clip.  Marland Kitchen     Hemostasis was observed. Lavage was carried out until clear. The fascia was then reapproximated with running sutures of 0Vicryl. The subcuticular closure was performed using 2-0 plain. The skin was 3-0 monocryl   Instrument, sponge, and needle counts were correct prior the abdominal closure and were correct at the conclusion of the case.    Findings: female infant in footling breech presentation.  Clear fluid noted.  Large gastroschisis noted.     Estimated Blood Loss:  Total IV Fluids:   Urine Output: 150CC OF clear urine  Specimens: placenta to pathology  Complications: no complications  Disposition: PACU - hemodynamically stable.   Maternal Condition: stable   Baby condition / location:  will be transferred to [facility]  Attending Attestation: I was present and scrubbed for the entire procedure.   Signed: Surgeon(s): Michael Litter, MD Hal Morales, MD

## 2012-02-13 NOTE — Consult Note (Signed)
I met with mom in her room, 4 hours after c-section of baby with gastoshcesis, transported to [facility]. I set up a Medela DEP, showed mom how to use it, basic teaching done. Mom expressed total of 20 mls coolsotrum.   II spoke to [place], peer couselor at [facility]. She is working on having a [place] DEP delivered to mom her a [facility], prior to her discharge on Wednesday.

## 2012-02-13 NOTE — Anesthesia Procedure Notes (Signed)
Spinal  Patient location during procedure: OR Preanesthetic Checklist Completed: patient identified, site marked, surgical consent, pre-op evaluation, timeout performed, IV checked, risks and benefits discussed and monitors and equipment checked Spinal Block Patient position: sitting Prep: DuraPrep Patient monitoring: cardiac monitor, continuous pulse ox, blood pressure and heart rate Approach: midline Location: L3-4 Injection technique: catheter Needle Needle type: Tuohy and Sprotte  Needle gauge: 24 G Needle length: 12.7 cm Needle insertion depth: 6 cm Catheter type: closed end flexible Catheter size: 19 g Catheter at skin depth: 12 cm Assessment Sensory level: T4 Additional Notes Spinal Dosage in OR  Bupivicaine ml       1.5 PFMS04   mcg        150 Fentanyl mcg            25

## 2012-02-13 NOTE — Progress Notes (Signed)
02/13/12 1430  Clinical Encounter Type  Visited With Patient  Visit Type Initial;Social support;Spiritual support;Post-op  Referral From Nurse  Recommendations Spiritual Care will follow for spiritual and emotional support.  Spiritual Encounters  Spiritual Needs Emotional  Stress Factors  Patient Stress Factors Major life changes (Penny [name] at [facility]; children ages 67 and 2 at home.)    Referred by Salena Saner, RN on WU to provide support in PACU prior to pt's arrive on floor.  PACU RN Zola Button affirmed [name]' level of preparation for this scenario, providing positive encouragement to pt and helping pt understand why meds were making her feel so sleepy.  Thank you for your thoughtful care, which helped put pt at ease!  Per pt, she has two children at home (Penny [name], 45 and Penny [name] [spelling?], 2); Penny [name]'s intestinal condition was suspected at 12 weeks and confirmed at 14.  Penny [name] states that she felt relieved and less anxious after having the surgery and mused, "I'm surprised at how well I'm coping!"  Provided introduction to chaplain services, encouragement, pastoral presence, and witness to her story.  Arrived as transport team brought Penny [name] for mom to see prior to transport to [facility].  We decided to have a fuller visit tomorrow because she was so tired from meds and procedure.  She welcomes chaplain presence.  Avis Epley, [place] Chaplain [phone]

## 2012-02-13 NOTE — Anesthesia Postprocedure Evaluation (Signed)
  Anesthesia Post-op Note  Patient: Penny [name]  Procedure(s) Performed: Procedure(s) (LRB): CESAREAN SECTION WITH BILATERAL TUBAL LIGATION (Bilateral)  Patient Location: PACU and [facility]  Anesthesia Type: Spinal  Level of Consciousness: awake, alert  and oriented  Airway and Oxygen Therapy: Patient Spontanous Breathing  Post-op Pain: mild  Post-op Assessment: Post-op Vital signs reviewed  Post-op Vital Signs: Reviewed and stable  Complications: No apparent anesthesia complications

## 2012-02-13 NOTE — H&P (View-Only) (Signed)
Penny [name] is a 31 y.o. female, G3P1102 at 36 weeks, presenting for scheduled repeat C/S and BTL, with fetus with gastroschisis, placenta previa, and SGA vs. IUGR infant. Anticipated plan is to deliver patient at [facility] and transfer baby to [facility] for surgical repair of the abdominal defect.  This plan has developed in consultation with [facility] and Dr. Dillard, since the patient strongly desired delivery in [place] due to the arrangements required for her other children.  Pregnancy remarkable for: Previous C/S due to previa, with prior vaginal delivery Fetus with gastroschisis Placenta previa SGA vs IUGR infant Desires BTL, with consent signed 12/20/11  History of present pregnancy: Patient entered care at 10 2/7 weeks.  EDC of 03/11/12 was established by LMP and in agreement with 1st trimester US.  Suspected gastroschisis was noted at 12 weeks. She was referred to MFM at that time.   Another US was done at 14 weeks, with posterior previa noted at that time.  She had some palpitations at 14-16 weeks, and was referred to cardiology, with normal echo noted.  Initial plans were for a local pediatric surgeon to perform the gastroschisis repair in [place], but plans changed in the 3rd trimester due to anesthesia concerns about the surgery in [place].  Arrangements were then made for the patient to be transferred to MFM at [place] for delivery, allowing for the baby to receive surgery upon transfer to [place].  However, the patient strongly desired delivery in [place], due to necessary arrangements for her other children.  Therefore, final plans were made for [facility], then the baby will be transferred to [facility] for surgery.  The baby's growth, fluid, and BPPs were followed throughout the rest of the pregnancy.  Doppler flow remained normal, although fetal growth was noted to be consistent with SGA.  Patient was followed  by Dr. Dillard and MFM with serial USs during pregnancy.  1 hour early glucola was 136, with normal 3 hour GTT, then follow-up glucola was WNL at 28 weeks.  Patient was struggling with stress and depression at 28 weeks, and was placed on Zoloft.  BTL consent was signed at 28 weeks.   OB History    Grav Para Term Preterm Abortions TAB SAB Ect Mult Living   3 2 1 1 0 0 0 0 0 2    #1--2003--SVB female infant, 6+12 at 40 weeks.  No complications.  Delivered in [place].  Positive GBS #2--2010 Primary LTCS for previa at 36 weeks.  Female 5+10. Delivered in [place].  Had blood clot between placenta and cervix.  Positive GBS #3--Current   Past Medical History  Diagnosis Date  . Preterm labor     c/s at 36 wks  . Placenta previa     hx and with current pregnancy  . Migraines     last one over 1 month ago  . Anxiety     no meds  . Depression     no meds  Remote hx of STD in 2003-2005.   Past Surgical History  Procedure Date  . Cesarean section 2010    x 1 at 36 wks in [place]  . Svd 2003    x 1 in [place]  . Wisdom tooth extraction    Family History: family history includes Asthma in her paternal grandmother and son; Cancer in her mother; Hypertension in her sister; and Sickle cell trait in her daughter.  There is no history of Pseudochol   deficiency, and Malignant hyperthermia, and Anesthesia problems, and Hypotension, .  Social History:  reports that she has been smoking Cigarettes.  She has a 3 pack-year smoking history. She has never used smokeless tobacco. She reports that she does not drink alcohol or use illicit drugs.  FOB is involved and supportive, Martin Faison.  Patient is African American, denies a religious affiliation, has some college.  Currently unemployed.  FOB has a high school education, employed at [employer].  ROS:  Reports occasional contractions, +FM.  No leaking or bleeding   Prenatal labs: ABO, Rh: --/--/B POS (07/23 2237) Antibody: NEG (07/23 2230) Rubella:  Immune RPR:  NON REACTIVE (03/26 0955)  HBsAg: Negative (09/12 0000)  HIV: Non-reactive (09/12 0000)  GBS:    1st trimester screen WNL Initial glucola elevated at 136, normal 3 hour GTT--then normal repeat 28 weeks  Physical Exam: Chest clear  Heart RRR without murmur Abd soft, gravid, NT Pelvic deferred Ext WNL  FHR 150s by doppler.  Assessment/Plan: IUP at 36 weeks Fetal gastroschisis Placenta previa SGA vs IUGR Previous C/S, desires repeat Desires BTL  Plan: Admitted to [facility] per consult with Dr. Dillard Routine MD pre-op orders Arrangements made with [facility] for baby's transfer to [facility] immediately after delivery. Support to patient for fetal/infant issues.   Amiracle Neises 02/11/2012, 8:33 PM      

## 2012-02-13 NOTE — Progress Notes (Signed)
SW left message for L. Daniels/NICU SW at [facility] to alert him to MOB's situation/need for resources/limited support.  SW spoke to [employer] who will contact MOB directly.  SW informed MOB of these things and will check on her in the morning.

## 2012-02-13 NOTE — Interval H&P Note (Signed)
History and Physical Interval Note:  02/13/2012 10:51 AM  Penny [name]  has presented today for surgery, with the diagnosis of Placenta Previa; Infant with Gastroschisis  The various methods of treatment have been discussed with the patient and family. After consideration of risks, benefits and other options for treatment, the patient has consented to  Procedure(s) (LRB): CESAREAN SECTION WITH BILATERAL TUBAL LIGATION (Bilateral) as a surgical intervention .  The patients' history has been reviewed, patient examined, no change in status, stable for surgery.  I have reviewed the patients' chart and labs.  Questions were answered to the patient's satisfaction.     ZOXWRUE,AVWUJ A  Date of Initial H&P: 02/13/12 Pt was counseled on numerous occasions concerning the cesarean and care for the infant.  She understands her risk to be bleeding, infection, damage to internal organs like bowel and bladder.  Pt understands she may need a blood transfusion and hysterectomy because of the previa.  If her baby is stable they will take her to see her before transfer occurs.   History reviewed, patient examined, no change in status, stable for surgery.

## 2012-02-14 ENCOUNTER — Encounter (HOSPITAL_COMMUNITY): Payer: Self-pay | Admitting: *Deleted

## 2012-02-14 LAB — CBC
HCT: 24.5 % — ABNORMAL LOW (ref 36.0–46.0)
Hemoglobin: 8.1 g/dL — ABNORMAL LOW (ref 12.0–15.0)
MCH: 27.1 pg (ref 26.0–34.0)
MCHC: 33.1 g/dL (ref 30.0–36.0)
MCV: 81.9 fL (ref 78.0–100.0)
Platelets: 208 10*3/uL (ref 150–400)
RBC: 2.99 MIL/uL — ABNORMAL LOW (ref 3.87–5.11)
RDW: 13.9 % (ref 11.5–15.5)
WBC: 15.2 10*3/uL — ABNORMAL HIGH (ref 4.0–10.5)

## 2012-02-14 MED ORDER — POLYSACCHARIDE IRON COMPLEX 150 MG PO CAPS: 150.0000 mg | ORAL_CAPSULE | Freq: Every day | ORAL | Status: DC

## 2012-02-14 NOTE — Progress Notes (Signed)
Clinical Social Work Department PSYCHOSOCIAL ASSESSMENT - MATERNAL/CHILD 02/14/2012  Patient:  Penny [name], Penny [name]  Account Number:  [id number]  Admit Date:  02/13/2012  Marjo Bicker Name:   Duanne Moron    Clinical Social Worker:  Lulu Riding, LCSW   Date/Time:  02/14/2012 11:00 AM  Date Referred:  02/14/2012   Referral source  NICU     Referred reason  NICU   Other referral source:    I:  FAMILY / HOME ENVIRONMENT Child's legal guardian:  PARENT  Guardian - Name Guardian - Age Guardian - Address  Penny [name] [address], [place], [place], [place] [address]  Penny [name]  same   Other household support members/support persons Name Relationship DOB  Penny [name] SON 9  [address] [place]   Other support:   MOB has a very limited support system.  Her friend/godmother of baby is her greatest support person. Her name is Penny [name], but she often goes by her middle name, which is Mia.  MGM lives in [place].    II  PSYCHOSOCIAL DATA Information Source:  Patient Interview  Event organiser Employment:   FOB works at [employer] resources:  [employer] If [employer]:  [employer] Other  Psychologist, sport and exercise Housing  [employer] / Grade:   Maternity Care Coordinator / Child Services Coordination / Early Interventions:  Cultural issues impacting care:   none known    III  STRENGTHS Strengths  Compliance with medical plan  Home prepared for Child (including basic supplies)  Other - See comment  Understanding of illness   Strength comment:  Pediatrician is [facility]   IV  RISK FACTORS AND CURRENT PROBLEMS Current Problem:  YES   Risk Factor & Current Problem Patient Issue Family Issue Risk Factor / Current Problem Comment  Financial Resources Y Y limited resources  TRANSPORTATION Y Y no personal transportation    V  SOCIAL WORK ASSESSMENT SW met with MOB in her third floor room/304 to check on her today.  MOB thanked SW  again for accompanying her to the delivery of her baby yesterday.  She has been extremely appreciative of SW's support and assistance.  SW asked her how she is feeling emotionally.  She states she is feeling much better than she has in a while and much better than the last time she felt depressive symptoms.  She describes herself as much calmer than she has been in a long time. SW discussed signs and symptoms of PPD especially given baby's medical situation.  She states she feels comfortable talking with her doctor if symptoms arise.  She took medication in the past, but does not feel like she needs it at this time.  SW gave "Feelings After Birth" handout.  SW gave MOB a taxi voucher to get to [facility] tomorrow since she does not have personal transportation and does not have support people with a vehicle.  Medicaid Transportation is over-booked and cannot take her.  Wandra Mannan authorized this voucher when SW first told him about the situation during MOB's pregnancy.  SW asked MOB about her current relationship with FOB.  She states that things have been "rocky" during the pregnancy, but currently they are doing okay.  She denies any kind of abuse and states that he is supportive and helpful with the children.  SW explained baby's possibly eligibility for SSI given her medical situation and assisted her in completing application.  MOB thanked SW again.  VI SOCIAL WORK PLAN Social Work Plan  Psychosocial Support/Ongoing Assessment of Needs   Type of pt/family education:   If child protective services report - county:   If child protective services report - date:   Information/referral to community resources comment:   SSI   Other social work plan:

## 2012-02-14 NOTE — Consult Note (Signed)
I saw mom in her hospital room today She showed me a picture of the bab at [facility]. She had her initial surgery, They were able to fit all fo her organs back in in abdomen, but her legs turned blue, so they relieved the pressure with a silo. She will return to the OR on 4/4/, Friday, to hopefully be closed. Mom pumped twice yesterday. She has a scab on her left areola form the 24 flange. I increased her to 27 flanges with good fit, and added lanolin . Mom will try and pump every 3 hours today. I will freeze mom's colostrum in the NICU freezer for her, as per her [facility] nurse. I will follow.

## 2012-02-14 NOTE — Progress Notes (Addendum)
Subjective: Postpartum Day 1: Cesarean Delivery due to complete previa and fetal gastroschisis Patient reports no problems voiding.  Up ad lib, no syncope or dizziness.  Reports infant stable at [facility]--attempt to correct gastroschisis occurred yesterday, but they were not able to completely reduce the defect.  A silo was placed, and another attempt will occur on Friday.  Overall, the infant is stable.  Patient requests discharge tomorrow.    Objective: Vital signs in last 24 hours: Temp:  [97.5 F (36.4 C)-98.3 F (36.8 C)] 97.8 F (36.6 C) (04/02 0955) Pulse Rate:  [75-98] 75  (04/02 0955) Resp:  [16-20] 18  (04/02 0955) BP: (99-127)/(53-77) 125/77 mmHg (04/02 0955) SpO2:  [93 %-99 %] 99 % (04/02 0955) Weight:  [190 lb (86.183 kg)] 190 lb (86.183 kg) (04/01 1500)  Physical Exam:  General: alert Lochia: appropriate Uterine Fundus: firm Incision: Dressing CDI DVT Evaluation: No evidence of DVT seen on physical exam. Negative Homan's sign.   Basename 02/14/12 0539  HGB 8.1*  HCT 24.5*   Hgb 11.5 pre-delivery  Assessment/Plan: Status post Cesarean section. Doing well postoperatively.  Continue current care. Check orthostatics Iron BID Patient declines transfusion at present. Will evaluate for d/c tomorrow.  Nigel Bridgeman 02/14/2012, 1:00 PM   pt seen and examined agree with exam and care

## 2012-02-14 NOTE — Progress Notes (Signed)
Pt had family visiting and was not aware of the visitation policy regarding small children on floor after 2100.  Explained policy to pt and she requested to see the house supervisor.  Steward Drone came to the floor and explained hospital policy and informed pt that her small children could not stay the night.  Pt family members left for the evening and security was not involved.

## 2012-02-14 NOTE — Progress Notes (Signed)
02/14/12 1355  Clinical Encounter Type  Visited With Patient and family together  Visit Type Follow-up;Social support  Spiritual Encounters  Spiritual Needs Emotional  Stress Factors  Patient Stress Factors Major life changes    Visited with [name] and, later, with FOB and children when they arrived.  [name] was perky, walking as much as possible to try to keep leg/foot swelling down, and feeling very ready to catch up with Chloe at [facility].  She appreciated the phone calls she received last night from Chloe's surgery team, which helped reduce anxiety/increase confidence about Chloe's medical situation.  Provided empathic listening, encouragement, and helped FOB to birth registrar to initiate paperwork. Learned more about social situation from FOB, as described in [place] earlier note. Family is aware of ongoing chaplain availability.   Avis Epley, [place] Chaplain [phone]

## 2012-02-15 ENCOUNTER — Telehealth: Payer: Self-pay | Admitting: Obstetrics and Gynecology

## 2012-02-15 LAB — CBC
HCT: 25.5 % — ABNORMAL LOW (ref 36.0–46.0)
Hemoglobin: 8.2 g/dL — ABNORMAL LOW (ref 12.0–15.0)
MCH: 26.7 pg (ref 26.0–34.0)
MCHC: 32.2 g/dL (ref 30.0–36.0)
MCV: 83.1 fL (ref 78.0–100.0)
Platelets: 215 10*3/uL (ref 150–400)
RBC: 3.07 MIL/uL — ABNORMAL LOW (ref 3.87–5.11)
RDW: 14 % (ref 11.5–15.5)
WBC: 14.6 10*3/uL — ABNORMAL HIGH (ref 4.0–10.5)

## 2012-02-15 MED ORDER — FERROUS SULFATE 325 (65 FE) MG PO TABS: 325.0000 mg | ORAL_TABLET | Freq: Every morning | ORAL | Status: DC

## 2012-02-15 MED ORDER — OXYCODONE-ACETAMINOPHEN 5-325 MG PO TABS: 1.0000 | ORAL_TABLET | ORAL | Status: AC | PRN

## 2012-02-15 MED ORDER — IBUPROFEN 600 MG PO TABS: 600.0000 mg | ORAL_TABLET | Freq: Four times a day (QID) | ORAL | Status: AC | PRN

## 2012-02-15 NOTE — Progress Notes (Signed)
UR chart review completed.  

## 2012-02-15 NOTE — Discharge Summary (Signed)
Obstetric Discharge Summary Reason for Admission: Repeat C-Section secondary to Placenta Previa; Infant with Gastroschisis; previous cesarean section. Footling breech presentation Prenatal Procedures: ultrasound Intrapartum Procedures: cesarean: low cervical, transverse Postpartum Procedures: none Complications-Operative and Postpartum: none Hemoglobin  Date Value Range Status  02/15/2012 8.2* 12.0-15.0 (g/dL) Final     HCT  Date Value Range Status  02/15/2012 25.5* 36.0-46.0 (%) Final    Physical Exam:  General: alert and no distress Lochia: appropriate Uterine Fundus: firm Incision: healing well and steristrips in place DVT Evaluation: No evidence of DVT seen on physical exam. No cords or calf tenderness. No significant calf/ankle edema.  Discharge Diagnoses: s/p C-Section doing well  Discharge Information: Date: 02/15/2012 Activity: as tolerated Diet: routine Medications: Ibuprofen, Iron and Percocet Condition: stable and and per report the baby is doing well.  The bowel was reduced into the abdomen and closure is planned for today, 02/15/12 at 3p. Instructions: discussed and sheet given to patient Discharge to: home Follow-up Information    Follow up with [facility], MD in 6 weeks. (as scheduled)    Contact information:   [address] [place] [address] [phone]          Newborn Data: Live born female  Birth Weight: 5 lb 14.1 oz (2668 g) APGAR: 8, 9  Baby at [facility] undergoing surgery planned for 02/15/12.Marland Kitchen  Penny [name] Y 02/15/2012, 2:30 PM

## 2012-02-15 NOTE — Telephone Encounter (Signed)
Now at [facility] in [place], with baby at [facility] having surgical repair of  Gastroschisis.  Baby doing well, but patient very upset and stressed about " seeing her like that". Patient struggles with anxiety and some depression--doesn't feel depressed, just "anxious" Children with her at the [facility], patient doing well physically on day 3 post-op C/S. Minimal pain.  20 min discussion with patient regarding her feelings and stressors. Encouraged to utilized resources of [facility] for parental support. Rx Vistaril 50 mg po 1-2 q 6 hours prn anxiety, #36, no refills.  Will call if issues increase, or if feels needs to consider anti-depressants or needs other resources.  Nigel Bridgeman, CNM, [place] 02/15/12 11:25pm

## 2012-02-15 NOTE — Consult Note (Signed)
Mom received a [employer] pump today from [place], International aid/development worker from [facility]. I reviewed pumping frequency and duration with mom. She was concerned that she was only expressing a few mls of colostrum today. I explained that is normal for 48 hours after delivery, and that she should see her mature milk come in around 72 hours. Her baby is going back to surgery today to be closed - all of the silo contents are now in the baby's abdomen. Mom relieved. Her milk is frozen, and she hs an insulated baby and ice pack to transport the milk to  [facility].

## 2012-02-17 LAB — TYPE AND SCREEN
ABO/RH(D): B POS
Antibody Screen: NEGATIVE
Unit division: 0
Unit division: 0
Unit division: 0
Unit division: 0

## 2012-02-21 ENCOUNTER — Telehealth: Payer: Self-pay

## 2012-02-21 NOTE — Telephone Encounter (Signed)
Spoke with pt rgd msg... Pt wants rx for stronger pain meds something stronger then percocet per nd pt needs ov pt voice understanding. ng

## 2012-02-23 ENCOUNTER — Ambulatory Visit (INDEPENDENT_AMBULATORY_CARE_PROVIDER_SITE_OTHER): Payer: Medicaid Other | Admitting: Obstetrics and Gynecology

## 2012-02-23 ENCOUNTER — Encounter: Payer: Self-pay | Admitting: Obstetrics and Gynecology

## 2012-02-23 VITALS — BP 120/80 | Temp 98.3°F | Ht 63.0 in | Wt 180.0 lb

## 2012-02-23 DIAGNOSIS — Z9889 Other specified postprocedural states: Secondary | ICD-10-CM

## 2012-02-23 DIAGNOSIS — R109 Unspecified abdominal pain: Secondary | ICD-10-CM

## 2012-02-23 DIAGNOSIS — Z98891 History of uterine scar from previous surgery: Secondary | ICD-10-CM

## 2012-02-23 MED ORDER — OXYCODONE-ACETAMINOPHEN 10-325 MG PO TABS: 1.0000 | ORAL_TABLET | Freq: Four times a day (QID) | ORAL | Status: DC | PRN

## 2012-02-23 NOTE — Progress Notes (Addendum)
Pt wants to chnge pain meds states percocet not working. Pt complains of incisional pain No SOB, CP Normal bowel and bladder function Results for orders placed during the hospital encounter of 02/13/12  TYPE AND SCREEN      Component Value Range   ABO/RH(D) B POS     Antibody Screen NEG     Sample Expiration 02/16/2012     Unit Number [id number]     Blood Component Type RED CELLS,LR     Unit division 00     Status of Unit REL FROM [facility]     Transfusion Status OK TO TRANSFUSE     Crossmatch Result Compatible     Unit Number [id number]     Blood Component Type RED CELLS,LR     Unit division 00     Status of Unit REL FROM [facility]     Transfusion Status OK TO TRANSFUSE     Crossmatch Result Compatible     Unit Number [id number]     Blood Component Type RED CELLS,LR     Unit division 00     Status of Unit REL FROM [facility]     Transfusion Status OK TO TRANSFUSE     Crossmatch Result Compatible     Unit Number [id number]     Blood Component Type RED CELLS,LR     Unit division 00     Status of Unit REL FROM [facility]     Transfusion Status OK TO TRANSFUSE     Crossmatch Result Compatible    PREPARE RBC (CROSSMATCH)      Component Value Range   Order Confirmation ORDER PROCESSED BY BLOOD BANK    ABO/RH      Component Value Range   ABO/RH(D) B POS    CBC      Component Value Range   WBC 15.2 (*) 4.0 - 10.5 (K/uL)   RBC 2.99 (*) 3.87 - 5.11 (MIL/uL)   Hemoglobin 8.1 (*) 12.0 - 15.0 (g/dL)   HCT 13.2 (*) 44.0 - 46.0 (%)   MCV 81.9  78.0 - 100.0 (fL)   MCH 27.1  26.0 - 34.0 (pg)   MCHC 33.1  30.0 - 36.0 (g/dL)   RDW 10.2  72.5 - 36.6 (%)   Platelets 208  150 - 400 (K/uL)  CBC      Component Value Range   WBC 14.6 (*) 4.0 - 10.5 (K/uL)   RBC 3.07 (*) 3.87 - 5.11 (MIL/uL)   Hemoglobin 8.2 (*) 12.0 - 15.0 (g/dL)   HCT 44.0 (*) 34.7 - 46.0 (%)   MCV 83.1  78.0 - 100.0 (fL)   MCH 26.7  26.0 - 34.0 (pg)   MCHC 32.2  30.0 - 36.0 (g/dL)   RDW 42.5  95.6 - 38.7 (%)   Platelets 215  150 -  400 (K/uL)   .Physical Examination: General appearance - alert, well appearing, and in no distress Chest - clear to auscultation, no wheezes, rales or rhonchi, symmetric air entry Heart - normal rate, regular rhythm, normal S1, S2, no murmurs, rubs, clicks or gallops Abdomen - soft, nontender, nondistended, no masses or organomegaly Incision CDI Pelvic - normal external genitalia, vulva, vagina, cervix, uterus and adnexa, examination not indicated Back exam - full range of motion, no tenderness, palpable spasm or pain on motion Rx for percocet given to pt She is healing and doing well RTO for pp visit in 4 weeks

## 2012-02-24 ENCOUNTER — Telehealth: Payer: Self-pay

## 2012-02-24 NOTE — Telephone Encounter (Signed)
Tc to pt rgd questions pt wants to know if she can take flexeril while breast feeding lm on vm per ND pt to consult with pediatrician.

## 2012-03-07 ENCOUNTER — Telehealth: Payer: Self-pay

## 2012-03-07 NOTE — Telephone Encounter (Signed)
Pt called to report having burning/irritating sensation on Rt side of her c/s incision, and states she thinks her "clothes rubbed it."  Denies any drainage or bleeding; skin along incision intact and not open.  Denies any redness around area.  Denies fever or chills.  S/p c/s on 02/13/12. Rec'd pt wear loose clothing and keep articles from rubbing area.  Instructed pt could be part of healing process.  Also suggested applying a minimal amt of vasoline to area, but sparingly.  Rev'd s/s of infection to report.  Pt has PP visit scheduled for 03/27/12.  Reports newborn still at [facility], but doc's are discussing possible d/c next week.  Support given.  F/u prn.

## 2012-03-17 ENCOUNTER — Telehealth: Payer: Self-pay | Admitting: Obstetrics and Gynecology

## 2012-03-17 NOTE — Telephone Encounter (Signed)
Calling with stinging/tingling along suture line of C/S of 02/13/12, also is able to see one stitch visible in midline of incision. No drainage or redness of incision at present. Baby still in NICU at [facility] s/p gastroschisis repair right after birth.  Had bradycardia episode this week.  Will apply heat or cold to incision line for comfort. Offered evaluation in office on Monday, or prior to that if any worsening of symptoms. Patient agreeable with evaluation in office on Monday. Will have triage staff call patient at [phone] to determine best time for patient to be evaluated on Monday. She is still staying in [place] at [facility].

## 2012-03-27 ENCOUNTER — Encounter: Payer: Medicaid Other | Admitting: Obstetrics and Gynecology

## 2012-03-30 ENCOUNTER — Ambulatory Visit (INDEPENDENT_AMBULATORY_CARE_PROVIDER_SITE_OTHER): Payer: Medicaid Other | Admitting: Obstetrics and Gynecology

## 2012-03-30 ENCOUNTER — Encounter: Payer: Self-pay | Admitting: Obstetrics and Gynecology

## 2012-03-30 NOTE — Progress Notes (Signed)
DATE OR SURGERY: 02/13/2012 PAIN:No VAG BLEEDING: no VAG DISCHARGE: no NORMAL GI FUNCTN: yes NORMAL GU FUNCTN: yes  Pt states wants birth control pills addition to tubal   Date of delivery: 02/13/2012 Female Name: Penny [name] Vaginal delivery:no Cesarean section:yes Tubal ligation:yes GDM:yes Breast Feeding:yes Bottle Feeding:yes Post-Partum Blues:no Abnormal pap:no Normal GU function: yes Normal GI function:yes Returning to work:no. female who presents for a postpartum visit.  ABD: soft nontender GU: vulva normal no masses seen.  Vagina normal in appearance.  Cervix is parous and NT.  Uterus normal size.  No adnexal tenderness bilaterally or fullness EXT: no CCEB Pt has BTL and deires ocps for heavy vaginal bleeding May resume intercourse , exercise and normal activity RT 9/13 for AEX

## 2012-04-02 ENCOUNTER — Telehealth: Payer: Self-pay | Admitting: Obstetrics and Gynecology

## 2012-04-02 NOTE — Telephone Encounter (Signed)
Niccole/nd pt °

## 2012-04-02 NOTE — Telephone Encounter (Signed)
Pt called requested refill for flexeril, postop 7weeks, states she doesn't have a regular doctor, and flexeril is for "migraines" Discussed w pt f/u a primary care or family care for evaluation, will not refill flexeril at this time.

## 2012-04-05 ENCOUNTER — Other Ambulatory Visit: Payer: Self-pay

## 2012-04-05 MED ORDER — NORETHINDRONE 0.35 MG PO TABS: 1.0000 | ORAL_TABLET | Freq: Every day | ORAL | Status: DC

## 2012-05-25 ENCOUNTER — Ambulatory Visit (INDEPENDENT_AMBULATORY_CARE_PROVIDER_SITE_OTHER): Payer: Medicaid Other | Admitting: Obstetrics and Gynecology

## 2012-05-25 ENCOUNTER — Encounter: Payer: Self-pay | Admitting: Obstetrics and Gynecology

## 2012-05-25 ENCOUNTER — Telehealth: Payer: Self-pay | Admitting: Obstetrics and Gynecology

## 2012-05-25 VITALS — BP 100/62 | HR 60 | Wt 183.0 lb

## 2012-05-25 DIAGNOSIS — F32A Depression, unspecified: Secondary | ICD-10-CM | POA: Insufficient documentation

## 2012-05-25 DIAGNOSIS — R102 Pelvic and perineal pain: Secondary | ICD-10-CM

## 2012-05-25 DIAGNOSIS — N926 Irregular menstruation, unspecified: Secondary | ICD-10-CM

## 2012-05-25 DIAGNOSIS — F329 Major depressive disorder, single episode, unspecified: Secondary | ICD-10-CM | POA: Insufficient documentation

## 2012-05-25 DIAGNOSIS — G43909 Migraine, unspecified, not intractable, without status migrainosus: Secondary | ICD-10-CM | POA: Insufficient documentation

## 2012-05-25 DIAGNOSIS — N949 Unspecified condition associated with female genital organs and menstrual cycle: Secondary | ICD-10-CM

## 2012-05-25 DIAGNOSIS — O4400 Placenta previa specified as without hemorrhage, unspecified trimester: Secondary | ICD-10-CM | POA: Insufficient documentation

## 2012-05-25 DIAGNOSIS — F419 Anxiety disorder, unspecified: Secondary | ICD-10-CM | POA: Insufficient documentation

## 2012-05-25 LAB — CBC
HCT: 41.9 % (ref 36.0–46.0)
Hemoglobin: 13.4 g/dL (ref 12.0–15.0)
MCH: 26.1 pg (ref 26.0–34.0)
MCHC: 32 g/dL (ref 30.0–36.0)
MCV: 81.7 fL (ref 78.0–100.0)
Platelets: 363 10*3/uL (ref 150–400)
RBC: 5.13 MIL/uL — ABNORMAL HIGH (ref 3.87–5.11)
RDW: 16.8 % — ABNORMAL HIGH (ref 11.5–15.5)
WBC: 8.8 10*3/uL (ref 4.0–10.5)

## 2012-05-25 LAB — POCT URINE PREGNANCY: Preg Test, Ur: NEGATIVE

## 2012-05-25 MED ORDER — MEDROXYPROGESTERONE ACETATE 10 MG PO TABS: ORAL_TABLET | ORAL | Status: DC

## 2012-05-25 NOTE — Progress Notes (Signed)
30 YO S/P C-section February 13, 2012 along with BTL, and has bled off and on for 8 weeks (nursing every 3 hours) then spotted 3 days 1 week later and now began spotting 2 days ago but is now heavier.  Changing regular pads for heavy flow every 3 hours. Admits to cramps rated as 6/10 involving back and pelvis.  Hasn't taken anything for cramps.  [typically menses x 5 days with pad change 3/day with no cramps].  Denies dyspareunia, urinary tract symptoms or changes in bowel movements.  O: Abdomen: diffusely tender without guarding, rebound or organomegaly           Pelvic: EGBUS-wnl though blood stained, vagina-large blood, cervix-non-tender, uterus-retroverted and tender posteriorly, adnexae-no masses       or tenderness       UPT-negative       A: Menometrorrhagia     Pelvic Pain     S/P C-section-02/13/12     Lactating     S/P BTL  P:  CBC, TSH, GC/CT-pending       Provera 10 mg  #20  2 po qd x 3 then qd until completed no refills      RTO- 2 weeks- Dr. Normand Sloop after ultrasound of pelvis to evaluate pelvic pain  Romie Tay, PA-C

## 2012-05-25 NOTE — Telephone Encounter (Signed)
TC from pt c/o increased bleeding, s/p c/s w BTL in April. Was seen by E.Powell in office today and given rx for provera, pt states she just took provera about an hour ago, instructed it takes some time to work and to taken another now, then BID x3 as instructed, also c/o cramps, has not taken anything, instructed to take 800mg  motrin every 8 hours. Cal if saturating lg in 1 hr x2. Pt instructed to keep f/u for pelvic US in 2wks, or sooner if necessary. Labs drawn today pending.

## 2012-05-25 NOTE — Telephone Encounter (Signed)
KESHIA/EPIC

## 2012-05-25 NOTE — Telephone Encounter (Signed)
Pt called, started cycle yesterday, when she had awakened this am, states pad was full, had put on another pad and current pad is almost full and it has been a little over an hour.  Pt also c/o cramping in her lower abd and back.  Pt delivered x 3 months ago, had BTL and is currently on contraception.  Pt w/i today @ 1445 for eval w/ EP.

## 2012-05-25 NOTE — Progress Notes (Signed)
When did bleeding start: WED. OF THIS WEEK How  Long: OFF AND ON  How often changing pad/tampon: 3 X A DAY Bleeding Disorders: no Contraception: yes PT HAD BTL Fibroids: yes Hormone Therapy: no New Medications: no Menopausal Symptoms: no Vag. Discharge: no Abdominal Pain: yes Increased Stress: no  PT ALSO C/O BACK PAIN

## 2012-05-26 LAB — TSH: TSH: 0.359 u[IU]/mL (ref 0.350–4.500)

## 2012-05-28 ENCOUNTER — Telehealth: Payer: Self-pay | Admitting: Obstetrics and Gynecology

## 2012-05-28 ENCOUNTER — Telehealth: Payer: Self-pay

## 2012-05-28 LAB — GC/CHLAMYDIA PROBE AMP, GENITAL
Chlamydia, DNA Probe: NEGATIVE
GC Probe Amp, Genital: NEGATIVE

## 2012-05-28 NOTE — Telephone Encounter (Signed)
TC from patient--seen on Friday for dysfunctional bleeding.  Had C/S with BTL on 02/13/12--this bleeding has been intermittent, but heavier last week. On Provera 2 tabs q day x 3 days (just completed that regimen), starts Provera 1 po q day tomorrow.   Still has bleeding--wearing heavy maxi-pads, changing 3 times per day.  Bleeding tonight is not any heavier, just still there. Patient feeling more tired tonight--no syncope or dizziness.  Hgb on Friday 13.4, TSH 0.359.  Reviewed status with patient--advised patient we could always see her in MAU, but the other option is to continue to observe this evening, then be seen in the office tomorrow if bleeding has not improved. Patient agreeable with that plan.  I will have office call the patient in the am to review status. Patient is to call with any worsening of symptoms.

## 2012-05-28 NOTE — Telephone Encounter (Signed)
Message copied by Rolla Plate on Mon May 28, 2012  9:32 AM ------      Message from: Cornelius Moras      Created: Mon May 28, 2012  1:47 AM      Regarding: Call patient for update       Patient was seen by EP on Friday for dysfunctional bleeding--placed on Provera.  Still having bleeding as of Sunday night.  Hgb 13.4 on Friday, with TSH 0.359 (low normal).            Please call her on Monday am to check on status.      If bleeding is no better, will need to be seen for hgb check.            Thanks!            VL

## 2012-05-28 NOTE — Telephone Encounter (Signed)
Lm on vm tcb rgd labs 

## 2012-05-28 NOTE — Telephone Encounter (Signed)
Spoke with pt rgd labs informed labs wnl pt states bleeding has decreased

## 2012-05-29 ENCOUNTER — Encounter: Payer: Medicaid Other | Admitting: Obstetrics and Gynecology

## 2012-06-07 ENCOUNTER — Telehealth: Payer: Self-pay | Admitting: Obstetrics and Gynecology

## 2012-06-07 NOTE — Telephone Encounter (Signed)
Niccole/quest.

## 2012-06-08 ENCOUNTER — Ambulatory Visit (INDEPENDENT_AMBULATORY_CARE_PROVIDER_SITE_OTHER): Payer: Medicaid Other | Admitting: Obstetrics and Gynecology

## 2012-06-08 ENCOUNTER — Encounter: Payer: Self-pay | Admitting: Obstetrics and Gynecology

## 2012-06-08 ENCOUNTER — Ambulatory Visit (INDEPENDENT_AMBULATORY_CARE_PROVIDER_SITE_OTHER): Payer: Medicaid Other

## 2012-06-08 VITALS — BP 120/80 | Ht 64.0 in | Wt 182.0 lb

## 2012-06-08 DIAGNOSIS — N926 Irregular menstruation, unspecified: Secondary | ICD-10-CM

## 2012-06-08 DIAGNOSIS — N949 Unspecified condition associated with female genital organs and menstrual cycle: Secondary | ICD-10-CM

## 2012-06-08 DIAGNOSIS — R102 Pelvic and perineal pain: Secondary | ICD-10-CM

## 2012-06-08 DIAGNOSIS — N920 Excessive and frequent menstruation with regular cycle: Secondary | ICD-10-CM

## 2012-06-08 NOTE — Progress Notes (Signed)
Pt here to f/u abnormal bleeding.  She stopped with the provera BP 120/80  Ht 5\' 4"  (1.626 m)  Wt 182 lb (82.555 kg)  BMI 31.24 kg/m2  LMP 05/23/2012  Breastfeeding? Yes [place] 7.4cm by 6.26 cm  Endometrium and adnexa are normal All treatments reviewed with the pt She desires to try nexplanon.  Will schedule AEX sue 9/13

## 2012-06-08 NOTE — Patient Instructions (Signed)
Patient Education Materials to be provided at check out (*indicates is located in accordion folder):  *Nexplanon 

## 2012-06-12 ENCOUNTER — Telehealth: Payer: Self-pay | Admitting: Obstetrics and Gynecology

## 2012-06-12 NOTE — Telephone Encounter (Signed)
VM from [facility].  Pt is scheduled for Nexplanon 06/18/12. Has question about how long to continue 2 pills/day.  TC to pt. Informed will consult with Dr ND 06/13/12 and call with recommendation.  Pt agreeable.

## 2012-06-12 NOTE — Telephone Encounter (Signed)
VM from [facility].  Pt called last PM because is still bleeding. Spoke with NSmith 06/09/12 and was advised to double up on birth control pills. Is awaiting Nexplanon.  To f/u pt status and Nexplanon.  TC to pt.    States continues to bleed off and on using 3 pads/day.  Is concerned because has never bled this much.  Bleeding precautions given. Will check status and inform N Grogan.  Pt agreeable.

## 2012-06-13 ENCOUNTER — Telehealth: Payer: Self-pay | Admitting: Obstetrics and Gynecology

## 2012-06-13 NOTE — Telephone Encounter (Signed)
TC to pt.  Per Dr ND advised to remain on 2 OCP pills per day until visit 06/18/12.  Pt verbalizes comprehension.

## 2012-06-18 ENCOUNTER — Ambulatory Visit (INDEPENDENT_AMBULATORY_CARE_PROVIDER_SITE_OTHER): Payer: Medicaid Other | Admitting: Obstetrics and Gynecology

## 2012-06-18 ENCOUNTER — Encounter: Payer: Self-pay | Admitting: Obstetrics and Gynecology

## 2012-06-18 VITALS — BP 116/72 | Wt 183.0 lb

## 2012-06-18 DIAGNOSIS — Z309 Encounter for contraceptive management, unspecified: Secondary | ICD-10-CM

## 2012-06-18 DIAGNOSIS — IMO0001 Reserved for inherently not codable concepts without codable children: Secondary | ICD-10-CM

## 2012-06-18 MED ORDER — ETONOGESTREL 68 MG ~~LOC~~ IMPL
68.0000 mg | DRUG_IMPLANT | Freq: Once | SUBCUTANEOUS | Status: AC
  Administered 2012-06-18: 68 mg via SUBCUTANEOUS

## 2012-06-18 NOTE — Progress Notes (Signed)
LMP: 05/23/12 INSERTION DATE: 06/18/12 REMOVAL DATE: due 06/19/15 INSERTION ARM: left PALPATED AFTER INSERT: yes IS PT SWITCHING FROM HORMONAL BC: yes LOT #: [id number] EXP: 04/2014 Nexplanon insertion Pts upper arm prepped with betadine.  1cc of 1% buffered lidocaine used for anesteshia.  Nexplanon inserted per protocol.  Pt tolerated the procedure.  Hemostasis noted at the end of procedure.  Nexplanon palpated by myself and the patient.

## 2012-06-18 NOTE — Patient Instructions (Signed)
Take one minipill a day for one week.  Then one pill every other day for a week.  Than stop

## 2012-06-23 ENCOUNTER — Telehealth: Payer: Self-pay | Admitting: Obstetrics and Gynecology

## 2012-06-23 NOTE — Telephone Encounter (Signed)
TC from patient--had Nexplanon placed 8/5 by Dr. Normand Sloop. Has questions regarding symptoms she is noticing since placement: Chest soreness on left side--no SOB, fever, etc. Headache Slight bloating Fatigue.  No bleeding or leg pain.  Reviewed issues--recommend patient try Ibuprophen for general discomfort, heating pad to area. Difficult to determine if symptoms directly related to Nexplanon, but could be. Encouraged patient to CTO how she feels, and to call with any worsening of symptoms or other questions. Patient agreeable with plan.

## 2012-06-23 NOTE — Telephone Encounter (Signed)
TC from patient--wanted to follow-up with me regarding TC from last night. Reports resolution of chest discomfort. Now has question regarding mood swings since placement of Nexplanon on 06/18/12. Reviewed possibility of progesterone impacting moods, but still unable to definitively blame all symptoms on Nexplanon. Advised patient to call office this week to consult with Dr. Normand Sloop regarding issues. To follow-up this weekend for any other signficant symptoms.

## 2012-06-25 ENCOUNTER — Telehealth: Payer: Self-pay | Admitting: Obstetrics and Gynecology

## 2012-06-25 MED ORDER — ETONOGESTREL 68 MG ~~LOC~~ IMPL
68.0000 mg | DRUG_IMPLANT | Freq: Once | SUBCUTANEOUS | Status: AC
  Administered 2012-06-25: 68 mg via SUBCUTANEOUS

## 2012-06-25 NOTE — Telephone Encounter (Signed)
Spoke with pt rgd msg pt states since having nexplanon inserted having some side effects mood swings,headaches, breast pain,stomach pain,nausea and back pain pt wants something to help with side effects advised pt will consult with ND pt voice understanding

## 2012-06-25 NOTE — Telephone Encounter (Signed)
NICCOLE/SIDE EFFECTS

## 2012-06-27 MED ORDER — PROMETHAZINE HCL 25 MG PO TABS: 25.0000 mg | ORAL_TABLET | Freq: Four times a day (QID) | ORAL | Status: DC | PRN

## 2012-06-27 NOTE — Telephone Encounter (Signed)
Spoke with pt rgd previous call advised per ND can call in rx for phenergan can take tylenol for headaches and back pain and if having chest pain recommend going to hospital for eval pt states wants percocet for headache advised pt need eval pt has appt 07/12/12 at 3:15 pt voice understanding

## 2012-07-12 ENCOUNTER — Encounter: Payer: Self-pay | Admitting: Obstetrics and Gynecology

## 2012-07-12 ENCOUNTER — Ambulatory Visit (INDEPENDENT_AMBULATORY_CARE_PROVIDER_SITE_OTHER): Payer: Medicaid Other | Admitting: Obstetrics and Gynecology

## 2012-07-12 VITALS — BP 100/60 | Wt 182.0 lb

## 2012-07-12 DIAGNOSIS — R51 Headache: Secondary | ICD-10-CM

## 2012-07-12 DIAGNOSIS — G8929 Other chronic pain: Secondary | ICD-10-CM

## 2012-07-12 DIAGNOSIS — N926 Irregular menstruation, unspecified: Secondary | ICD-10-CM

## 2012-07-12 DIAGNOSIS — R519 Headache, unspecified: Secondary | ICD-10-CM

## 2012-07-12 MED ORDER — FIORICET 50-325-40 MG PO TABS: 1.0000 | ORAL_TABLET | Freq: Four times a day (QID) | ORAL | Status: DC | PRN

## 2012-07-12 NOTE — Progress Notes (Signed)
When did bleeding start: 07/01/12, light bleeding every day How  Long: still bleeding How often changing pad/tampon: 2-3 times per day Bleeding Disorders: no Cramping: yes Contraception: yes nexplanon Fibroids: yes, Pt was told in her 2nd pregnancy she had some fibroids Hormone Therapy: no New Medications: no Menopausal Symptoms: no Vag. Discharge: no Abdominal Pain: yes Increased Stress: no Pt c/o headaches and soreness where nexplanon was inserted.  Physical Examination: General appearance - alert, well appearing, and in no distress Chest - clear to auscultation, no wheezes, rales or rhonchi, symmetric air entry Heart - normal rate and regular rhythm Abdomen - soft, nontender, nondistended, no masses or organomegaly Pelvic - normal external genitalia, vulva, vagina, cervix, uterus and adnexa, mild vaginal bleeding Left arm  No bruising.  implanon palpated Irregular Bleeding Headaches Pt desires to keep nexplanon.  She was reassured about the bleeding Will give fioricet for headaches and refer to headache center

## 2012-07-12 NOTE — Patient Instructions (Signed)

## 2012-07-18 ENCOUNTER — Emergency Department (HOSPITAL_COMMUNITY)
Admission: EM | Admit: 2012-07-18 | Discharge: 2012-07-18 | Discharge status: Against Medical Advice | Payer: Medicaid Other | Attending: Emergency Medicine | Admitting: Emergency Medicine

## 2012-07-18 ENCOUNTER — Emergency Department (HOSPITAL_COMMUNITY): Payer: Medicaid Other

## 2012-07-18 ENCOUNTER — Encounter (HOSPITAL_COMMUNITY): Payer: Self-pay | Admitting: Emergency Medicine

## 2012-07-18 DIAGNOSIS — R0602 Shortness of breath: Secondary | ICD-10-CM | POA: Insufficient documentation

## 2012-07-18 DIAGNOSIS — R064 Hyperventilation: Secondary | ICD-10-CM | POA: Insufficient documentation

## 2012-07-18 NOTE — ED Notes (Signed)
Pt has now stopped hyperventilating and is not tachypneic at triage, is breathing 15-17 respirations per minute

## 2012-07-18 NOTE — ED Notes (Addendum)
Pt reports SOB starting at 08-1029; pt hyperventilating at triage; spoke with pt about trying to slow her breathing; sp02 sats 98% on RA; pt does report cough with no phelgm; pt was able to control breathing to get oral temperature; pts lungs throughout all bases

## 2012-07-19 ENCOUNTER — Encounter (HOSPITAL_COMMUNITY): Payer: Self-pay | Admitting: Family Medicine

## 2012-07-19 ENCOUNTER — Emergency Department (HOSPITAL_COMMUNITY)
Admission: EM | Admit: 2012-07-19 | Discharge: 2012-07-19 | Disposition: A | Discharge status: Home / Self Care | Payer: Medicaid Other | Attending: Emergency Medicine | Admitting: Emergency Medicine

## 2012-07-19 DIAGNOSIS — F17200 Nicotine dependence, unspecified, uncomplicated: Secondary | ICD-10-CM | POA: Insufficient documentation

## 2012-07-19 DIAGNOSIS — F411 Generalized anxiety disorder: Secondary | ICD-10-CM | POA: Insufficient documentation

## 2012-07-19 DIAGNOSIS — F410 Panic disorder [episodic paroxysmal anxiety] without agoraphobia: Secondary | ICD-10-CM

## 2012-07-19 MED ORDER — LORAZEPAM 1 MG PO TABS: 1.0000 mg | ORAL_TABLET | Freq: Three times a day (TID) | ORAL | Status: AC | PRN

## 2012-07-19 MED ORDER — LORAZEPAM 1 MG PO TABS
1.0000 mg | ORAL_TABLET | Freq: Once | ORAL | Status: AC
  Administered 2012-07-19: 1 mg via ORAL
  Filled 2012-07-19: qty 1

## 2012-07-19 NOTE — ED Provider Notes (Signed)
History     CSN: [id number]  Arrival date & time 07/19/12  1652   First MD Initiated Contact with Patient 07/19/12 1701      Chief Complaint  Patient presents with  . Anxiety    (Consider location/radiation/quality/duration/timing/severity/associated sxs/prior treatment) HPI Comments: 31 year old female presents to the emergency department complaining of an anxiety attack while helping his son do some work earlier this afternoon. She is breathing into a paper bag and unable to speak at this time. 1 mg lorazepam given. Was lorazepam given, patient is able to finish the history. Denies any recent stressors that may have triggered the attack. She has a history of anxiety attacks, but has only had about 3 this year. She had one last night as well, went to [facility] ER, had a chest x-ray which was normal, but left due to the wait being too long. Currently denies any shortness of breath, nausea, and palpitations, which she had upon arousal emergency department. Denies any chest pain, vomiting, dizziness. She is not on any maintenance anxiety medications. She has a prescription for lorazepam which she was given one year ago for an anxiety attack.  Patient is a 31 y.o. female presenting with anxiety. The history is provided by the patient.  Anxiety Pertinent negatives include no chest pain or vomiting. Nausea: subsided.    Past Medical History  Diagnosis Date  . Preterm labor     c/s at 36 wks  . Placenta previa     hx and with current pregnancy  . Migraines     last one over 1 month ago  . Anxiety     no meds  . Depression     no meds    Past Surgical History  Procedure Date  . Cesarean section 2010    x 1 at 36 wks in [place]  . Svd 2003    x 1 in [place]  . Wisdom tooth extraction     Family History  Problem Relation Age of Onset  . Cancer Mother   . Hypertension Sister   . Sickle cell trait Daughter   . Asthma Son   . Asthma Paternal Grandmother   . Pseudochol deficiency  Neg Hx   . Malignant hyperthermia Neg Hx   . Anesthesia problems Neg Hx   . Hypotension Neg Hx     History  Substance Use Topics  . Smoking status: Current Some Day Smoker -- 0.2 packs/day for 12 years    Types: Cigarettes  . Smokeless tobacco: Never Used  . Alcohol Use: No     PReviously drank on occ    OB History    Grav Para Term Preterm Abortions TAB SAB Ect Mult Living   3 3 1 2  0 0 0 0 0 3      Review of Systems  Constitutional: Negative for activity change and appetite change.  Respiratory: Shortness of breath: subsided.   Cardiovascular: Negative for chest pain. Palpitations: subsided.  Gastrointestinal: Negative for vomiting. Nausea: subsided.  Neurological: Negative for dizziness and light-headedness.  Psychiatric/Behavioral: The patient is nervous/anxious.     Allergies  Review of patient's allergies indicates no known allergies.  Home Medications   Current Outpatient Rx  Name Route Sig Dispense Refill  . FIORICET 50-325-40 MG PO TABS Oral Take 1-2 tablets by mouth every 6 (six) hours as needed for headache. 20 tablet 0    Dispense as written.  Marland Kitchen PROMETHAZINE HCL 25 MG PO TABS Oral Take 25 mg by mouth  every 6 (six) hours as needed. nausea      BP 104/66  Pulse 89  Temp 98.1 F (36.7 C) (Oral)  Resp 26  SpO2 99%  LMP 07/01/2012  Physical Exam  Constitutional: She is oriented to person, place, and time. She appears well-developed and well-nourished.  HENT:  Head: Normocephalic and atraumatic.  Mouth/Throat: Oropharynx is clear and moist.  Eyes: Conjunctivae and EOM are normal.  Neck: Normal range of motion. Neck supple.  Cardiovascular: Normal rate, regular rhythm and normal heart sounds.   Pulmonary/Chest: Effort normal and breath sounds normal.  Abdominal: Soft. Bowel sounds are normal. There is no tenderness.  Musculoskeletal: Normal range of motion. She exhibits no edema.  Neurological: She is alert and oriented to person, place, and time.    Skin: Skin is warm and dry. She is not diaphoretic.  Psychiatric: Her speech is normal and behavior is normal. Judgment and thought content normal. Her mood appears anxious. Cognition and memory are normal.    ED Course  Procedures (including critical care time)  Labs Reviewed - No data to display Dg Chest 2 View  07/18/2012  *RADIOLOGY REPORT*  Clinical Data: Tachycardia and shortness of breath.  History of anxiety attacks.  CHEST - 2 VIEW  Comparison: None.  Findings: There is a moderate thoracolumbar scoliosis, convex to the right in the mid thoracic region.  The heart size and mediastinal contours are normal.  The lungs are clear.  There is no pleural effusion or pneumothorax.  No acute osseous findings are seen.  IMPRESSION: No active cardiopulmonary process.  Scoliosis.   Original Report Authenticated By: Gerrianne Scale, M.D.      1. Anxiety attack       MDM  31 year old female with anxiety attack. She is the same last night, had a normal chest x-ray at [facility]. Discussed importance of discussing anxiety issues with her PCP. Close return precautions discussed.       Trevor Mace, PA-C 07/19/12 1829

## 2012-07-19 NOTE — ED Notes (Signed)
Pt verbalizes understanding 

## 2012-07-19 NOTE — ED Notes (Signed)
Pt reports anxiety attack starting last night. States she has medication at home for this but it is expired. Reports headache and nausea.

## 2012-07-19 NOTE — ED Provider Notes (Signed)
Medical screening examination/treatment/procedure(s) were performed by non-physician practitioner and as supervising physician I was immediately available for consultation/collaboration.   Christie Copley B. Bernette Mayers, MD 07/19/12 2328

## 2012-08-07 ENCOUNTER — Telehealth: Payer: Self-pay | Admitting: Obstetrics and Gynecology

## 2012-08-07 NOTE — Telephone Encounter (Signed)
Ms Penny [name] had a Nexplanon placed by Dr Normand Sloop 07/02/12. The patient has has had severe headaches since the placement of the device and has been prescribed Fioricet 1 -2 tablets po Q 6- 8 hrly PRN. The patient also has been have vaginal bleeding since placement of the device. The patient describes change in mood and feels very agitated with this hormonal influence. The patient wants to have the device removed as miserable with symptoms. To contact Dr. Normand Sloop to advise of the patient problems. Patient will schedule for removal of the implant with Dr Normand Sloop at the office.  Earl Gala, CNM.

## 2012-08-08 ENCOUNTER — Telehealth: Payer: Self-pay | Admitting: Obstetrics and Gynecology

## 2012-08-08 NOTE — Telephone Encounter (Signed)
Spoke with pt rgd msg pt states wants nexplanon removed due to frequent headaches no relief with rx advised pt referred to [facility] they will call with appt pt also have appt 08/28/12 at 3:45 for nexplanon removal pt voice understanding

## 2012-08-15 ENCOUNTER — Telehealth: Payer: Self-pay | Admitting: Obstetrics and Gynecology

## 2012-08-15 NOTE — Telephone Encounter (Signed)
Spoke with pt rgd concerns pt c/o severe side effects from nexplanon pt staes she has crying spells shes crying all the time and anxious wants nexplanon removed pt has appt 08/16/12 with nd pt voice understadning

## 2012-08-16 ENCOUNTER — Ambulatory Visit (INDEPENDENT_AMBULATORY_CARE_PROVIDER_SITE_OTHER): Payer: Medicaid Other | Admitting: Obstetrics and Gynecology

## 2012-08-16 ENCOUNTER — Encounter: Payer: Self-pay | Admitting: Obstetrics and Gynecology

## 2012-08-16 VITALS — BP 108/64 | Wt 184.0 lb

## 2012-08-16 DIAGNOSIS — IMO0001 Reserved for inherently not codable concepts without codable children: Secondary | ICD-10-CM

## 2012-08-16 DIAGNOSIS — Z309 Encounter for contraceptive management, unspecified: Secondary | ICD-10-CM

## 2012-08-16 NOTE — Progress Notes (Signed)
Nexplanon removal .  Pt desires the nexplanon to be removed because of the side effects BP 108/64  Wt 184 lb (83.462 kg)  Breastfeeding? Yes left arm prepped and draped  3cc lidocaine used for ansthesia Small incision made with the scalpel nexplanon removed without difficulty with hemostat Incision closed with ster strips and pressure applied with 4x4 Pt tolerated the procedure well

## 2012-08-16 NOTE — Patient Instructions (Signed)

## 2012-08-18 ENCOUNTER — Encounter (HOSPITAL_COMMUNITY): Payer: Self-pay | Admitting: Emergency Medicine

## 2012-08-18 ENCOUNTER — Emergency Department (INDEPENDENT_AMBULATORY_CARE_PROVIDER_SITE_OTHER): Payer: Medicaid Other

## 2012-08-18 ENCOUNTER — Emergency Department (HOSPITAL_COMMUNITY)
Admission: EM | Admit: 2012-08-18 | Discharge: 2012-08-18 | Disposition: A | Discharge status: Home / Self Care | Payer: Medicaid Other | Source: Home / Self Care | Attending: Emergency Medicine | Admitting: Emergency Medicine

## 2012-08-18 DIAGNOSIS — S99919A Unspecified injury of unspecified ankle, initial encounter: Secondary | ICD-10-CM

## 2012-08-18 DIAGNOSIS — M25579 Pain in unspecified ankle and joints of unspecified foot: Secondary | ICD-10-CM

## 2012-08-18 DIAGNOSIS — S99929A Unspecified injury of unspecified foot, initial encounter: Secondary | ICD-10-CM

## 2012-08-18 DIAGNOSIS — S8990XA Unspecified injury of unspecified lower leg, initial encounter: Secondary | ICD-10-CM

## 2012-08-18 MED ORDER — HYDROCODONE-ACETAMINOPHEN 5-500 MG PO TABS: 1.0000 | ORAL_TABLET | Freq: Four times a day (QID) | ORAL | Status: DC | PRN

## 2012-08-18 MED ORDER — HYDROCODONE-ACETAMINOPHEN 7.5-500 MG/15ML PO SOLN: 15.0000 mL | Freq: Four times a day (QID) | ORAL | Status: DC | PRN

## 2012-08-18 MED ORDER — MELOXICAM 7.5 MG PO TABS: 7.5000 mg | ORAL_TABLET | Freq: Every day | ORAL | Status: DC

## 2012-08-18 NOTE — ED Notes (Signed)
Patient not in room for xray.

## 2012-08-18 NOTE — ED Notes (Signed)
Left foot inj since 15:00... Was getting off city bus and left foot was caught in between lift??? Some abrasion are visible... Sx include: swelling, pain w/limited range of motion.

## 2012-08-18 NOTE — ED Provider Notes (Signed)
History     CSN: [id number]  Arrival date & time 08/18/12  1624   First MD Initiated Contact with Patient 08/18/12 1628      Chief Complaint  Patient presents with  . Foot Injury    (Consider location/radiation/quality/duration/timing/severity/associated sxs/prior treatment) HPI Comments:  Was getting off city bus and left foot was caught in between lift??? Patient presents complaining of left anterior abrasions pain and swelling to her left ankle area. It hurts when she walks on it. Pain is exacerbated as well dorsi flexion and plantar flexion she demonstrates. Denies any numbness or tingling sensation to her foot, and no bleeding.  Patient is a 31 y.o. female presenting with foot injury. The history is provided by the patient.  Foot Injury  The incident occurred 1 to 2 hours ago. The injury mechanism was compression, a direct blow and torsion. The pain is present in the left foot. The pain is at a severity of 4/10. The pain is mild. The pain has been constant since onset. Associated symptoms include numbness and inability to bear weight. Pertinent negatives include no loss of motion, no muscle weakness, no loss of sensation and no tingling. She reports no foreign bodies present. The symptoms are aggravated by bearing weight. The treatment provided no relief.    Past Medical History  Diagnosis Date  . Preterm labor     c/s at 36 wks  . Placenta previa     hx and with current pregnancy  . Migraines     last one over 1 month ago  . Anxiety     no meds  . Depression     no meds    Past Surgical History  Procedure Date  . Cesarean section 2010    x 1 at 36 wks in [place]  . Svd 2003    x 1 in [place]  . Wisdom tooth extraction     Family History  Problem Relation Age of Onset  . Cancer Mother   . Hypertension Sister   . Sickle cell trait Daughter   . Asthma Son   . Asthma Paternal Grandmother   . Pseudochol deficiency Neg Hx   . Malignant hyperthermia Neg Hx   .  Anesthesia problems Neg Hx   . Hypotension Neg Hx     History  Substance Use Topics  . Smoking status: Current Some Day Smoker -- 0.2 packs/day for 12 years    Types: Cigarettes  . Smokeless tobacco: Never Used  . Alcohol Use: No     PReviously drank on occ    OB History    Grav Para Term Preterm Abortions TAB SAB Ect Mult Living   3 3 1 2  0 0 0 0 0 3      Review of Systems  Constitutional: Positive for activity change. Negative for fever and chills.  Musculoskeletal: Positive for joint swelling.  Skin: Positive for wound.  Neurological: Positive for numbness. Negative for tingling and weakness.    Allergies  Review of patient's allergies indicates no known allergies.  Home Medications   Current Outpatient Rx  Name Route Sig Dispense Refill  . FIORICET 50-325-40 MG PO TABS Oral Take 1-2 tablets by mouth every 6 (six) hours as needed for headache. 20 tablet 0    Dispense as written.  Marland Kitchen PROMETHAZINE HCL 25 MG PO TABS Oral Take 25 mg by mouth every 6 (six) hours as needed. nausea    . HYDROCODONE-ACETAMINOPHEN 7.5-500 MG/15ML PO SOLN Oral Take 15 mLs  by mouth every 6 (six) hours as needed for pain. 60 mL 0  . MELOXICAM 7.5 MG PO TABS Oral Take 1 tablet (7.5 mg total) by mouth daily. 14 tablet 0    BP 107/73  Pulse 97  Temp 98 F (36.7 C)  SpO2 99%  LMP 08/18/2012  Breastfeeding? Yes  Physical Exam  Nursing note and vitals reviewed. Constitutional: She appears well-developed and well-nourished.  Musculoskeletal: She exhibits tenderness.       Feet:  Neurological: She is alert.  Skin: Rash noted.    ED Course  Procedures (including critical care time)  Labs Reviewed - No data to display Dg Ankle Complete Left  08/18/2012  *RADIOLOGY REPORT*  Clinical Data: Foot injury.  LEFT ANKLE COMPLETE - 3+ VIEW  Comparison: None.  Findings: There is soft tissue swelling along the anterior aspect of the ankle.  No evidence for acute fracture or subluxation. Mortise is  intact.  IMPRESSION: Soft tissue swelling. Visualized osseous structures have a normal appearance.   Original Report Authenticated By: Patterson Hammersmith, M.D.      1. Ankle injury   2. Ankle joint pain       MDM  Ankle contusion with associated superficial abrasions. Patient was placed on a ankle air splint crutches- RICE measures provided with a prescription of meloxicam as an anti-inflammatory medicine to be taken for 7 days and to use Lortab for breakthrough discomfort. Discuss with her ankle exercises, and need to followup with the orthopedic Dr. if no improvement after 5-7 days or unable to bear any weight on her ankle. Patient agree with treatment plan and followup care as necessary        Jimmie Molly, MD 08/18/12 1914

## 2012-08-28 ENCOUNTER — Encounter: Payer: Medicaid Other | Admitting: Obstetrics and Gynecology

## 2012-09-08 ENCOUNTER — Emergency Department (HOSPITAL_COMMUNITY)
Admission: EM | Admit: 2012-09-08 | Discharge: 2012-09-08 | Disposition: A | Discharge status: Home / Self Care | Payer: Medicaid Other | Attending: Emergency Medicine | Admitting: Emergency Medicine

## 2012-09-08 ENCOUNTER — Emergency Department (HOSPITAL_COMMUNITY): Payer: Medicaid Other

## 2012-09-08 ENCOUNTER — Encounter (HOSPITAL_COMMUNITY): Payer: Self-pay | Admitting: Emergency Medicine

## 2012-09-08 DIAGNOSIS — R05 Cough: Secondary | ICD-10-CM | POA: Insufficient documentation

## 2012-09-08 DIAGNOSIS — H9209 Otalgia, unspecified ear: Secondary | ICD-10-CM | POA: Insufficient documentation

## 2012-09-08 DIAGNOSIS — Z8659 Personal history of other mental and behavioral disorders: Secondary | ICD-10-CM | POA: Insufficient documentation

## 2012-09-08 DIAGNOSIS — Z79899 Other long term (current) drug therapy: Secondary | ICD-10-CM | POA: Insufficient documentation

## 2012-09-08 DIAGNOSIS — J069 Acute upper respiratory infection, unspecified: Secondary | ICD-10-CM

## 2012-09-08 DIAGNOSIS — Z8669 Personal history of other diseases of the nervous system and sense organs: Secondary | ICD-10-CM | POA: Insufficient documentation

## 2012-09-08 DIAGNOSIS — R059 Cough, unspecified: Secondary | ICD-10-CM | POA: Insufficient documentation

## 2012-09-08 DIAGNOSIS — F17200 Nicotine dependence, unspecified, uncomplicated: Secondary | ICD-10-CM | POA: Insufficient documentation

## 2012-09-08 MED ORDER — IBUPROFEN 800 MG PO TABS: 800.0000 mg | ORAL_TABLET | Freq: Three times a day (TID) | ORAL | Status: DC | PRN

## 2012-09-08 MED ORDER — ALBUTEROL SULFATE HFA 108 (90 BASE) MCG/ACT IN AERS: 1.0000 | INHALATION_SPRAY | Freq: Four times a day (QID) | RESPIRATORY_TRACT | Status: DC | PRN

## 2012-09-08 MED ORDER — ONDANSETRON 4 MG PO TBDP
4.0000 mg | ORAL_TABLET | Freq: Once | ORAL | Status: AC
  Administered 2012-09-08: 4 mg via ORAL
  Filled 2012-09-08: qty 1

## 2012-09-08 MED ORDER — SODIUM CHLORIDE 0.9 % IV BOLUS (SEPSIS)
1000.0000 mL | Freq: Once | INTRAVENOUS | Status: DC
Start: 2012-09-08 — End: 2012-09-08

## 2012-09-08 MED ORDER — GUAIFENESIN 100 MG/5ML PO LIQD: 100.0000 mg | ORAL | Status: DC | PRN

## 2012-09-08 MED ORDER — BENZONATATE 100 MG PO CAPS: 100.0000 mg | ORAL_CAPSULE | Freq: Three times a day (TID) | ORAL | Status: DC | PRN

## 2012-09-08 MED ORDER — ALBUTEROL SULFATE (5 MG/ML) 0.5% IN NEBU
5.0000 mg | INHALATION_SOLUTION | Freq: Once | RESPIRATORY_TRACT | Status: AC
  Administered 2012-09-08: 5 mg via RESPIRATORY_TRACT
  Filled 2012-09-08: qty 1

## 2012-09-08 NOTE — ED Provider Notes (Signed)
Medical screening examination/treatment/procedure(s) were performed by non-physician practitioner and as supervising physician I was immediately available for consultation/collaboration.  Doug Sou, MD 09/08/12 [phone]

## 2012-09-08 NOTE — ED Notes (Signed)
Pt states having coughing, ear stuffiness, nasal congestion, slight headache. Denies sore throat. States all 3 of her children have been sick w/ viral type illnesses w/ fever this week. Pt states it burns in throat when she coughs, and is vomiting mucous which is also causing throat pain. Pt states she does have hx of "walking pneumonia"

## 2012-09-08 NOTE — ED Provider Notes (Signed)
History     CSN: [id number]  Arrival date & time 09/08/12  1319   First MD Initiated Contact with Patient 09/08/12 1412      Chief Complaint  Patient presents with  . Nasal Congestion  . Cough    (Consider location/radiation/quality/duration/timing/severity/associated sxs/prior treatment) HPI Comments: Patient reports she has had two days of nasal congestion, sneezing, bilateral ear itching, cough productive of clear sputum, and post-tussive emesis.  Denies sore throat.  Denies fevers.  Pt does have mild chest pain with coughing but denies any SOB.  Her children have all been sick at home with similar symptoms for the past week.  Pt came in because she states the last time she felt like this she had either pneumonia or the flu.   Patient is a 31 y.o. female presenting with cough. The history is provided by the patient.  Cough Associated symptoms include ear pain and rhinorrhea. Pertinent negatives include no chills, no sore throat and no shortness of breath.    Past Medical History  Diagnosis Date  . Preterm labor     c/s at 36 wks  . Placenta previa     hx and with current pregnancy  . Migraines     last one over 1 month ago  . Anxiety     no meds  . Depression     no meds    Past Surgical History  Procedure Date  . Cesarean section 2010    x 1 at 36 wks in [place]  . Svd 2003    x 1 in [place]  . Wisdom tooth extraction     Family History  Problem Relation Age of Onset  . Cancer Mother   . Hypertension Sister   . Sickle cell trait Daughter   . Asthma Son   . Asthma Paternal Grandmother   . Pseudochol deficiency Neg Hx   . Malignant hyperthermia Neg Hx   . Anesthesia problems Neg Hx   . Hypotension Neg Hx     History  Substance Use Topics  . Smoking status: Current Some Day Smoker -- 0.2 packs/day for 12 years    Types: Cigarettes  . Smokeless tobacco: Never Used  . Alcohol Use: No     PReviously drank on occ    OB History    Grav Para Term Preterm  Abortions TAB SAB Ect Mult Living   3 3 1 2  0 0 0 0 0 3      Review of Systems  Constitutional: Negative for fever and chills.  HENT: Positive for ear pain, congestion, rhinorrhea and sneezing. Negative for sore throat, trouble swallowing, neck stiffness and sinus pressure.   Respiratory: Positive for cough. Negative for shortness of breath.   Gastrointestinal: Negative for nausea, vomiting, abdominal pain and diarrhea.  Genitourinary: Negative for dysuria, urgency, frequency, vaginal bleeding and vaginal discharge.    Allergies  Review of patient's allergies indicates no known allergies.  Home Medications   Current Outpatient Rx  Name Route Sig Dispense Refill  . FIORICET 50-325-40 MG PO TABS Oral Take 1-2 tablets by mouth every 6 (six) hours as needed for headache. 20 tablet 0    Dispense as written.  Marland Kitchen PROMETHAZINE HCL 25 MG PO TABS Oral Take 25 mg by mouth every 6 (six) hours as needed. nausea    . HYDROCODONE-ACETAMINOPHEN 7.5-500 MG/15ML PO SOLN Oral Take 15 mLs by mouth every 6 (six) hours as needed for pain. 60 mL 0  . MELOXICAM 7.5 MG PO  TABS Oral Take 1 tablet (7.5 mg total) by mouth daily. 14 tablet 0    BP 124/76  Pulse 110  Temp 98.4 F (36.9 C) (Oral)  SpO2 98%  LMP 08/18/2012  Physical Exam  Nursing note and vitals reviewed. Constitutional: She appears well-developed and well-nourished. No distress.  HENT:  Head: Normocephalic and atraumatic.  Right Ear: Tympanic membrane and ear canal normal.  Left Ear: Tympanic membrane and ear canal normal.  Nose: No mucosal edema. Right sinus exhibits maxillary sinus tenderness. Right sinus exhibits no frontal sinus tenderness. Left sinus exhibits maxillary sinus tenderness. Left sinus exhibits no frontal sinus tenderness.  Mouth/Throat: Uvula is midline. Mucous membranes are not dry. No uvula swelling. Posterior oropharyngeal erythema present. No oropharyngeal exudate, posterior oropharyngeal edema or tonsillar abscesses.   Eyes: Conjunctivae normal are normal.  Neck: Neck supple.  Cardiovascular: Normal rate and regular rhythm.   Pulmonary/Chest: Effort normal and breath sounds normal. No respiratory distress. She has no wheezes. She has no rales.  Neurological: She is alert.  Skin: She is not diaphoretic.    ED Course  Procedures (including critical care time)  Labs Reviewed - No data to display Dg Chest 2 View  09/08/2012  *RADIOLOGY REPORT*  Clinical Data: Cough, congestion  CHEST - 2 VIEW  Comparison: 07/18/2012  Findings: Stable dextroscoliosis of the thoracic spine.  Minimal linear scarring or subsegmental atelectasis in the lingula.  Lungs otherwise clear.  No effusion.  Heart size normal.  IMPRESSION:  1.  Minimal lingular subsegmental atelectasis or scarring.   Original Report Authenticated By: Osa Craver, M.D.    Pt requests nebulized breathing treatment prior to discharge.  Denies any SOB.    1. URI (upper respiratory infection)     MDM  Pt with two days of respiratory symptoms, family members with similar symptoms.  Lungs are clear, oropharynx clear.  MM are moist.  CXR negative.  Likely viral infection.  Pt d/c home with symptomatic medications.  Discussed all results with patient.  Pt given return precautions.  Pt verbalizes understanding and agrees with plan.           [place], [place] 09/08/12 1500

## 2012-09-09 ENCOUNTER — Encounter (HOSPITAL_COMMUNITY): Payer: Self-pay | Admitting: Emergency Medicine

## 2012-09-09 ENCOUNTER — Emergency Department (HOSPITAL_COMMUNITY)
Admission: EM | Admit: 2012-09-09 | Discharge: 2012-09-09 | Disposition: A | Discharge status: Home / Self Care | Payer: Medicaid Other | Attending: Emergency Medicine | Admitting: Emergency Medicine

## 2012-09-09 DIAGNOSIS — Z8751 Personal history of pre-term labor: Secondary | ICD-10-CM | POA: Insufficient documentation

## 2012-09-09 DIAGNOSIS — Z79899 Other long term (current) drug therapy: Secondary | ICD-10-CM | POA: Insufficient documentation

## 2012-09-09 DIAGNOSIS — K529 Noninfective gastroenteritis and colitis, unspecified: Secondary | ICD-10-CM

## 2012-09-09 DIAGNOSIS — Z8659 Personal history of other mental and behavioral disorders: Secondary | ICD-10-CM | POA: Insufficient documentation

## 2012-09-09 DIAGNOSIS — K5289 Other specified noninfective gastroenteritis and colitis: Secondary | ICD-10-CM | POA: Insufficient documentation

## 2012-09-09 DIAGNOSIS — G43909 Migraine, unspecified, not intractable, without status migrainosus: Secondary | ICD-10-CM | POA: Insufficient documentation

## 2012-09-09 DIAGNOSIS — Z8742 Personal history of other diseases of the female genital tract: Secondary | ICD-10-CM | POA: Insufficient documentation

## 2012-09-09 DIAGNOSIS — F17200 Nicotine dependence, unspecified, uncomplicated: Secondary | ICD-10-CM | POA: Insufficient documentation

## 2012-09-09 LAB — URINALYSIS, ROUTINE W REFLEX MICROSCOPIC
Glucose, UA: NEGATIVE mg/dL
Nitrite: NEGATIVE
Protein, ur: 30 mg/dL — AB
Specific Gravity, Urine: 1.035 — ABNORMAL HIGH (ref 1.005–1.030)
Urobilinogen, UA: 1 mg/dL (ref 0.0–1.0)
pH: 5.5 (ref 5.0–8.0)

## 2012-09-09 LAB — COMPREHENSIVE METABOLIC PANEL
ALT: 15 U/L (ref 0–35)
AST: 14 U/L (ref 0–37)
Albumin: 4 g/dL (ref 3.5–5.2)
Alkaline Phosphatase: 81 U/L (ref 39–117)
BUN: 7 mg/dL (ref 6–23)
CO2: 24 mEq/L (ref 19–32)
Calcium: 9.4 mg/dL (ref 8.4–10.5)
Chloride: 101 mEq/L (ref 96–112)
Creatinine, Ser: 0.62 mg/dL (ref 0.50–1.10)
GFR calc Af Amer: >90 mL/min (ref >90)
GFR calc non Af Amer: >90 mL/min (ref >90)
Glucose, Bld: 87 mg/dL (ref 70–99)
Potassium: 3.3 mEq/L — ABNORMAL LOW (ref 3.5–5.1)
Sodium: 135 mEq/L (ref 135–145)
Total Bilirubin: 0.9 mg/dL (ref 0.3–1.2)
Total Protein: 8.1 g/dL (ref 6.0–8.3)

## 2012-09-09 LAB — URINE MICROSCOPIC-ADD ON

## 2012-09-09 LAB — PREGNANCY, URINE: Preg Test, Ur: NEGATIVE

## 2012-09-09 MED ORDER — PROMETHAZINE HCL 25 MG/ML IJ SOLN
25.0000 mg | Freq: Once | INTRAMUSCULAR | Status: AC
  Administered 2012-09-09: 25 mg via INTRAVENOUS
  Filled 2012-09-09: qty 1

## 2012-09-09 MED ORDER — PROMETHAZINE HCL 25 MG PO TABS: 25.0000 mg | ORAL_TABLET | Freq: Three times a day (TID) | ORAL | Status: DC | PRN

## 2012-09-09 MED ORDER — POTASSIUM CHLORIDE CRYS ER 20 MEQ PO TBCR
40.0000 meq | EXTENDED_RELEASE_TABLET | Freq: Once | ORAL | Status: AC
  Administered 2012-09-09: 40 meq via ORAL
  Filled 2012-09-09: qty 2

## 2012-09-09 MED ORDER — HYDROCODONE-ACETAMINOPHEN 5-325 MG PO TABS
1.0000 | ORAL_TABLET | Freq: Once | ORAL | Status: AC
  Administered 2012-09-09: 1 via ORAL
  Filled 2012-09-09: qty 1

## 2012-09-09 MED ORDER — SODIUM CHLORIDE 0.9 % IV BOLUS (SEPSIS)
1000.0000 mL | Freq: Once | INTRAVENOUS | Status: AC
  Administered 2012-09-09: 1000 mL via INTRAVENOUS

## 2012-09-09 MED ORDER — ACETAMINOPHEN 325 MG PO TABS
650.0000 mg | ORAL_TABLET | Freq: Once | ORAL | Status: AC
  Administered 2012-09-09: 650 mg via ORAL
  Filled 2012-09-09: qty 2

## 2012-09-09 MED ORDER — KETOROLAC TROMETHAMINE 30 MG/ML IJ SOLN
30.0000 mg | Freq: Once | INTRAMUSCULAR | Status: AC
  Administered 2012-09-09: 30 mg via INTRAVENOUS
  Filled 2012-09-09: qty 1

## 2012-09-09 MED ORDER — HYDROCODONE-ACETAMINOPHEN 5-325 MG PO TABS: 1.0000 | ORAL_TABLET | Freq: Four times a day (QID) | ORAL | Status: DC | PRN

## 2012-09-09 NOTE — ED Provider Notes (Signed)
Medical screening examination/treatment/procedure(s) were performed by non-physician practitioner and as supervising physician I was immediately available for consultation/collaboration.  Jearld Hemp, MD 09/09/12 1605 

## 2012-09-09 NOTE — ED Notes (Signed)
Per PT[facility], patient was seen yesterday in the ER and treated for the same.  Patient has been having N/V/D since yesterday and symptoms have not improved.

## 2012-09-09 NOTE — ED Notes (Signed)
Per pt she was here yesterday due to cough and feeling sick and was sent home with cough medication and ibuprofen. Today pt presents to ED with N/V/D that started last night. Pt also c/o fever, chills and abdominal pain. Pt sts multiple episodes of emesis and diarrhea since last night. Pt sts coughing and runny nose she had yesterday is getting better at this time. Pt sts she feels constantly  Nauseated.

## 2012-09-09 NOTE — ED Notes (Signed)
This RN attempted to insert IV x2, no success, will ask another TN to assess and insert peripheral IV.

## 2012-09-09 NOTE — ED Provider Notes (Signed)
History     CSN: [id number]  Arrival date & time 09/09/12  1139   First MD Initiated Contact with Patient 09/09/12 1223      Chief Complaint  Patient presents with  . Nausea  . Emesis  . Diarrhea    (Consider location/radiation/quality/duration/timing/severity/associated sxs/prior treatment) HPI Comments: 31 year old female with no significant past medical history presents emergency department with a chief complaint of nausea vomiting and diarrhea.  Symptom onset began last night and is associated with diffuse abdominal cramping.  Patient was evaluated in emergency department yesterday for upper respiratory type symptoms including nasal congestion, rhinorrhea, cough, night sweats, chills and myalgias.  X-ray showed no acute infiltrate and patient was treated symptomatically for a URI.  Patient denies any fevers, localized abdominal pain, melena, hematochezia, syncope, dizziness, chest pain, vaginal discharge, urinary frequency, dysuria, or back pain.  The history is provided by the patient.    Past Medical History  Diagnosis Date  . Preterm labor     c/s at 36 wks  . Placenta previa     hx and with current pregnancy  . Migraines     last one over 1 month ago  . Anxiety     no meds  . Depression     no meds    Past Surgical History  Procedure Date  . Cesarean section 2010    x 1 at 36 wks in [place]  . Svd 2003    x 1 in [place]  . Wisdom tooth extraction     Family History  Problem Relation Age of Onset  . Cancer Mother   . Hypertension Sister   . Sickle cell trait Daughter   . Asthma Son   . Asthma Paternal Grandmother   . Pseudochol deficiency Neg Hx   . Malignant hyperthermia Neg Hx   . Anesthesia problems Neg Hx   . Hypotension Neg Hx     History  Substance Use Topics  . Smoking status: Current Some Day Smoker -- 0.2 packs/day for 12 years    Types: Cigarettes  . Smokeless tobacco: Never Used  . Alcohol Use: No     PReviously drank on occ    OB  History    Grav Para Term Preterm Abortions TAB SAB Ect Mult Living   3 3 1 2  0 0 0 0 0 3      Review of Systems  Constitutional: Positive for chills. Negative for fever, appetite change and fatigue.  HENT: Positive for congestion and rhinorrhea. Negative for hearing loss, ear pain, nosebleeds, sore throat, sneezing, trouble swallowing, neck stiffness, voice change, postnasal drip, sinus pressure, tinnitus and ear discharge.   Eyes: Negative for photophobia and visual disturbance.  Respiratory: Positive for cough and chest tightness. Negative for apnea, choking, shortness of breath, wheezing and stridor.   Cardiovascular: Negative for chest pain, palpitations and leg swelling.  Gastrointestinal: Positive for nausea, vomiting and diarrhea. Negative for abdominal pain and constipation.  Genitourinary: Positive for urgency. Negative for dysuria and flank pain.  Musculoskeletal: Negative for myalgias.  Neurological: Negative for dizziness, seizures, syncope, weakness, light-headedness, numbness and headaches.  Psychiatric/Behavioral: Negative for behavioral problems and confusion.  All other systems reviewed and are negative.    Allergies  Review of patient's allergies indicates no known allergies.  Home Medications   Current Outpatient Rx  Name Route Sig Dispense Refill  . ALBUTEROL SULFATE HFA 108 (90 BASE) MCG/ACT IN AERS Inhalation Inhale 1-2 puffs into the lungs every 6 (six) hours as  needed. wheezing    . BENZONATATE 100 MG PO CAPS Oral Take 100 mg by mouth 3 (three) times daily as needed. cough    . FIORICET 50-325-40 MG PO TABS Oral Take 1-2 tablets by mouth every 6 (six) hours as needed for headache. 20 tablet 0    Dispense as written.  . GUAIFENESIN 100 MG/5ML PO SOLN Oral Take 100-200 mg by mouth every 4 (four) hours as needed. cough    . IBUPROFEN 800 MG PO TABS Oral Take 800 mg by mouth every 8 (eight) hours as needed. pain    . PROMETHAZINE HCL 25 MG PO TABS Oral Take 25  mg by mouth every 6 (six) hours as needed. nausea    . HYDROCODONE-ACETAMINOPHEN 7.5-500 MG/15ML PO SOLN Oral Take 15 mLs by mouth every 6 (six) hours as needed for pain. 60 mL 0  . MELOXICAM 7.5 MG PO TABS Oral Take 1 tablet (7.5 mg total) by mouth daily. 14 tablet 0    BP 109/76  Pulse 106  Temp 98.6 F (37 C) (Oral)  Resp 20  SpO2 97%  LMP 08/18/2012  Physical Exam  Nursing note and vitals reviewed. Constitutional: She is oriented to person, place, and time. She appears well-developed and well-nourished. No distress.  HENT:  Head: Normocephalic and atraumatic.       Nasal congestion & rhinorrhea. Normal L & R external ear canals and TMs. No tragal or mastoid tenderness. Oropharynx moist, without tonsillar exudate.   Eyes:       No pain w EOM. Conjunctiva normal   Neck: Normal range of motion.       Soft, no nuchal rigidity. No lymphadenopathy  Cardiovascular:       RRR, no aberrancy on auscultation  Pulmonary/Chest: Effort normal.       LCAB, no respiratory distress  Abdominal:       Soft, hyperactive bowel sounds. Diffuse ttp without localization  Musculoskeletal: Normal range of motion.  Neurological: She is alert and oriented to person, place, and time.  Skin: Skin is warm and dry. She is not diaphoretic.       No rash  Psychiatric: She has a normal mood and affect. Her behavior is normal.    ED Course  Procedures (including critical care time)   Labs Reviewed  COMPREHENSIVE METABOLIC PANEL  URINALYSIS, ROUTINE W REFLEX MICROSCOPIC  PREGNANCY, URINE   Dg Chest 2 View  09/08/2012  *RADIOLOGY REPORT*  Clinical Data: Cough, congestion  CHEST - 2 VIEW  Comparison: 07/18/2012  Findings: Stable dextroscoliosis of the thoracic spine.  Minimal linear scarring or subsegmental atelectasis in the lingula.  Lungs otherwise clear.  No effusion.  Heart size normal.  IMPRESSION:  1.  Minimal lingular subsegmental atelectasis or scarring.   Original Report Authenticated By: Osa Craver, M.D.      No diagnosis found.    MDM  Gastroenteritis  Patient with symptoms consistent with viral gastroenteritis.  Vitals are stable, no fever.  No signs of dehydration, tolerating PO fluids > 6 oz.  Lungs are clear.  No focal abdominal pain, no concern for appendicitis, cholecystitis, pancreatitis, ruptured viscus, UTI, kidney stone, or any other abdominal etiology.  Supportive therapy indicated with return if symptoms worsen.  Patient counseled.         Jaci Carrel, [place] 09/09/12 1527

## 2012-09-12 ENCOUNTER — Telehealth: Payer: Self-pay | Admitting: Obstetrics and Gynecology

## 2012-09-12 ENCOUNTER — Encounter: Payer: Medicaid Other | Admitting: Obstetrics and Gynecology

## 2012-09-12 NOTE — Telephone Encounter (Signed)
TC to pt. Requesting recommendation for PCP.  Informed Dr Dorothyann Peng.

## 2012-09-19 ENCOUNTER — Telehealth: Payer: Self-pay | Admitting: Obstetrics and Gynecology

## 2012-09-19 ENCOUNTER — Telehealth: Payer: Self-pay

## 2012-09-19 MED ORDER — NORETHINDRONE ACET-ETHINYL EST 1-20 MG-MCG PO TABS: 1.0000 | ORAL_TABLET | Freq: Every day | ORAL | Status: DC

## 2012-09-19 NOTE — Telephone Encounter (Signed)
TC from patient--has menometrorrhagia, just had Nexplanon removed. Cycle just started, heavy.   What options does she have to control?  Provera or start OCPs, which were options presented previously. Still has some Provera from EP, had been given instructions to take 2 pills q day x 3 days, then 1 per day till bleeding stopped--these instructions were from July episode, which she did not complete course.  Has 6 pills left.  Can take Provera or start OCPs--will consult with ND regarding issue, since she is ND pt. Spoke with ND.  Patient can come by and get sample pk of Lo Lo Estrin tomorrow, then Joni Reining can call in Lo Estrin 1/20 for patient use. Will send message to [place] per ND.

## 2012-09-19 NOTE — Telephone Encounter (Signed)
Spoke with pt informed recommendations per ND pt will come by office today to pick up samples

## 2012-09-19 NOTE — Telephone Encounter (Signed)
Lm on vm tcb rgd previous call per ND pt to get sample of lo Loestrin and call in rx for Loestrin 1/20

## 2012-09-26 ENCOUNTER — Encounter: Payer: Self-pay | Admitting: Obstetrics and Gynecology

## 2012-09-26 ENCOUNTER — Ambulatory Visit (INDEPENDENT_AMBULATORY_CARE_PROVIDER_SITE_OTHER): Payer: Medicaid Other | Admitting: Obstetrics and Gynecology

## 2012-09-26 VITALS — BP 110/70 | Ht 64.0 in | Wt 188.0 lb

## 2012-09-26 DIAGNOSIS — Z124 Encounter for screening for malignant neoplasm of cervix: Secondary | ICD-10-CM

## 2012-09-26 MED ORDER — NORETHINDRONE ACET-ETHINYL EST 1-20 MG-MCG PO TABS: 1.0000 | ORAL_TABLET | Freq: Every day | ORAL | Status: DC

## 2012-09-26 NOTE — Patient Instructions (Signed)

## 2012-09-26 NOTE — Progress Notes (Signed)
Last Pap: 07/2011 WNL: Yes Regular Periods:yes Contraception: BTL and Lo loestrin  Monthly Breast exam:yes Tetanus<83yrs:yes Nl.Bladder Function:yes Daily BMs:yes Healthy Diet:yes Calcium:yes Mammogram:yes Date of Mammogram: n/a Exercise:no Have often Exercise: n/a Seatbelt: yes Abuse at home: no Stressful work:no Sigmoid-colonoscopy: n/a Bone Density: No PCP: n/a Change in PMH: migraines Change in FMH:no change BP 110/70  Ht 5\' 4"  (1.626 m)  Wt 188 lb (85.276 kg)  BMI 32.27 kg/m2  LMP 09/19/2012 Pt with complaints:no Physical Examination: General appearance - alert, well appearing, and in no distress Mental status - normal mood, behavior, speech, dress, motor activity, and thought processes Neck - supple, no significant adenopathy,  thyroid exam: thyroid is normal in size without nodules or tenderness Chest - clear to auscultation, no wheezes, rales or rhonchi, symmetric air entry Heart - normal rate and regular rhythm Abdomen - soft, nontender, nondistended, no masses or organomegaly Breasts - breasts appear normal, no suspicious masses, no skin or nipple changes or axillary nodes Pelvic - normal external genitalia, vulva, vagina, cervix, uterus and adnexa Rectal - rectal exam not indicated Back exam - full range of motion, no tenderness, palpable spasm or pain on motion Neurological - alert, oriented, normal speech, no focal findings or movement disorder noted Musculoskeletal - no joint tenderness, deformity or swelling Extremities - no edema, redness or tenderness in the calves or thighs Skin - normal coloration and turgor, no rashes, no suspicious skin lesions noted Routine exam Pap sent yes Mammogram due no BTL used for contraception loestrin used for menorrhagia and working well RT 1 yr

## 2012-09-28 LAB — PAP IG W/ RFLX HPV ASCU

## 2012-10-01 LAB — HUMAN PAPILLOMAVIRUS, HIGH RISK: HPV DNA High Risk: NOT DETECTED

## 2012-10-09 ENCOUNTER — Telehealth: Payer: Self-pay

## 2012-10-09 NOTE — Telephone Encounter (Signed)
Message copied by Rolla Plate on Tue Oct 09, 2012 11:25 AM ------      Message from: Jaymes Graff      Created: Sun Oct 07, 2012 10:23 PM       Please tell patient her pap results and that she can repeat her pap in one year.  Thank you

## 2012-10-09 NOTE — Telephone Encounter (Signed)
Spoke with pr gd labs informed pap results repeat 1 yr pt c/o irreg bleeding with bc advised pt that's nl will have irreg bleeding first three months of taking bc pt voice understanding

## 2012-11-11 ENCOUNTER — Telehealth (HOSPITAL_COMMUNITY): Payer: Self-pay | Admitting: Obstetrics and Gynecology

## 2012-11-11 NOTE — Telephone Encounter (Signed)
TC from patient--hx DUB, started on LoEstrin in October.  Onset of bleeding yesterday, heavy today.  Denies dizziness, other sx. Still has irregular bleeding--last cycle in late November, with 2 episodes brief bleeding in December. On last week of pills.  Questions what to do.  Will consult with Dr. Normand Sloop regarding patient status.

## 2012-11-26 ENCOUNTER — Emergency Department (HOSPITAL_COMMUNITY)
Admission: EM | Admit: 2012-11-26 | Discharge: 2012-11-27 | Disposition: A | Discharge status: Home / Self Care | Payer: Medicaid Other | Attending: Emergency Medicine | Admitting: Emergency Medicine

## 2012-11-26 ENCOUNTER — Encounter (HOSPITAL_COMMUNITY): Payer: Self-pay | Admitting: Emergency Medicine

## 2012-11-26 DIAGNOSIS — Z3202 Encounter for pregnancy test, result negative: Secondary | ICD-10-CM | POA: Insufficient documentation

## 2012-11-26 DIAGNOSIS — G40909 Epilepsy, unspecified, not intractable, without status epilepticus: Secondary | ICD-10-CM | POA: Insufficient documentation

## 2012-11-26 DIAGNOSIS — F17200 Nicotine dependence, unspecified, uncomplicated: Secondary | ICD-10-CM | POA: Insufficient documentation

## 2012-11-26 DIAGNOSIS — Z8751 Personal history of pre-term labor: Secondary | ICD-10-CM | POA: Insufficient documentation

## 2012-11-26 DIAGNOSIS — Z79899 Other long term (current) drug therapy: Secondary | ICD-10-CM | POA: Insufficient documentation

## 2012-11-26 DIAGNOSIS — R112 Nausea with vomiting, unspecified: Secondary | ICD-10-CM | POA: Insufficient documentation

## 2012-11-26 DIAGNOSIS — Z8659 Personal history of other mental and behavioral disorders: Secondary | ICD-10-CM | POA: Insufficient documentation

## 2012-11-26 DIAGNOSIS — G43909 Migraine, unspecified, not intractable, without status migrainosus: Secondary | ICD-10-CM

## 2012-11-26 DIAGNOSIS — H53149 Visual discomfort, unspecified: Secondary | ICD-10-CM | POA: Insufficient documentation

## 2012-11-26 LAB — POCT PREGNANCY, URINE: Preg Test, Ur: NEGATIVE

## 2012-11-26 MED ORDER — DIPHENHYDRAMINE HCL 50 MG/ML IJ SOLN
25.0000 mg | Freq: Once | INTRAMUSCULAR | Status: AC
  Administered 2012-11-26: 25 mg via INTRAVENOUS
  Filled 2012-11-26: qty 1

## 2012-11-26 MED ORDER — METOCLOPRAMIDE HCL 5 MG/ML IJ SOLN
10.0000 mg | Freq: Once | INTRAMUSCULAR | Status: AC
  Administered 2012-11-26: 10 mg via INTRAVENOUS
  Filled 2012-11-26: qty 2

## 2012-11-26 MED ORDER — DIAZEPAM 5 MG/ML IJ SOLN
10.0000 mg | Freq: Once | INTRAMUSCULAR | Status: AC
  Administered 2012-11-26: 10 mg via INTRAVENOUS
  Filled 2012-11-26: qty 2

## 2012-11-26 MED ORDER — KETOROLAC TROMETHAMINE 30 MG/ML IJ SOLN
30.0000 mg | Freq: Once | INTRAMUSCULAR | Status: AC
  Administered 2012-11-26: 30 mg via INTRAVENOUS
  Filled 2012-11-26: qty 1

## 2012-11-26 MED ORDER — SODIUM CHLORIDE 0.9 % IV BOLUS (SEPSIS)
1000.0000 mL | Freq: Once | INTRAVENOUS | Status: AC
  Administered 2012-11-26: 1000 mL via INTRAVENOUS

## 2012-11-26 NOTE — ED Notes (Signed)
Per EMS: Pt ha hx of migraines and ran out of meds. Pt report nausea no vomiting. Pt unsure of med prescribed of migraine. 7/10 pain.

## 2012-11-26 NOTE — ED Notes (Signed)
[id number]<UY> Expected date:11/26/12<BR> Expected time: 9:18 PM<BR> Means of arrival:Ambulance<BR> Comments:<BR> Medic 10  Headache 32 yo

## 2012-11-26 NOTE — ED Provider Notes (Signed)
History     CSN: [id number]  Arrival date & time 11/26/12  2127   First MD Initiated Contact with Patient 11/26/12 2308      No chief complaint on file.   (Consider location/radiation/quality/duration/timing/severity/associated sxs/prior treatment) HPI Comments: Penny [name] is a 32 y.o. female with a history of migraines and anxiety presents emergency department with chief complaint of headache.  Onset of headache began today approximately 7 p.m. and is associated with photophobia, nausea, emesis x1 and sensitivity to sound.  Headache location is in the (oral region described as throbbing.  Patient states this is a similar presentation to her past migraines however it does feel more severe.  In addition patient reports that she thinks she slept wrong last night and is having left muscular shoulder and upper back pain believing that this may be triggering her migraine.  Patient denies any numbness or tingling of the upper extremity, double vision, ataxia or disequilibrium, fevers, night sweats, chills, neck pain, change in BM or abdominal pain.   The history is provided by the patient.    Past Medical History  Diagnosis Date  . Preterm labor     c/s at 36 wks  . Placenta previa     hx and with current pregnancy  . Migraines     last one over 1 month ago  . Anxiety     no meds  . Depression     no meds    Past Surgical History  Procedure Date  . Cesarean section 2010    x 1 at 36 wks in [place]  . Svd 2003    x 1 in [place]  . Wisdom tooth extraction   . Tubal ligation     Family History  Problem Relation Age of Onset  . Cancer Mother   . Hypertension Sister   . Sickle cell trait Daughter   . Asthma Son   . Asthma Paternal Grandmother   . Pseudochol deficiency Neg Hx   . Malignant hyperthermia Neg Hx   . Anesthesia problems Neg Hx   . Hypotension Neg Hx     History  Substance Use Topics  . Smoking status: Current Some Day Smoker -- 0.2 packs/day for 12 years      Types: Cigarettes  . Smokeless tobacco: Never Used  . Alcohol Use: No     Comment: PReviously drank on occ    OB History    Grav Para Term Preterm Abortions TAB SAB Ect Mult Living   3 3 1 2  0 0 0 0 0 3      Review of Systems  Allergies  Review of patient's allergies indicates no known allergies.  Home Medications   Current Outpatient Rx  Name  Route  Sig  Dispense  Refill  . ALBUTEROL SULFATE HFA 108 (90 BASE) MCG/ACT IN AERS   Inhalation   Inhale 1-2 puffs into the lungs every 6 (six) hours as needed. wheezing         . IBUPROFEN 200 MG PO TABS   Oral   Take 400 mg by mouth every 8 (eight) hours as needed. For pain.         . NORETHINDRONE ACET-ETHINYL EST 1-20 MG-MCG PO TABS   Oral   Take 1 tablet by mouth daily.   1 Package   11     BP 139/88  Pulse 113  Temp 99.2 F (37.3 C) (Oral)  Resp 18  SpO2 98%  LMP 11/10/2012  Physical Exam  Nursing note and vitals reviewed. Constitutional: She is oriented to person, place, and time. She appears well-developed and well-nourished. She appears distressed.  HENT:  Head: Normocephalic and atraumatic.  Eyes: Conjunctivae normal and EOM are normal. Pupils are equal, round, and reactive to light. No scleral icterus.  Neck: Normal range of motion.       Neck supple with no nuchal rigidity, full pain free ROM. No carotid bruit  Cardiovascular: Normal rate, regular rhythm, normal heart sounds and intact distal pulses.   Pulmonary/Chest: Effort normal and breath sounds normal. No respiratory distress.  Musculoskeletal: Normal range of motion.  Neurological: She is alert and oriented to person, place, and time. She has normal strength.       CN III-XII intact, good coordination, normal gait, strength 5/5 bilaterally, intact distal sensation.  Skin: Skin is warm and dry.       No rash, non diaphoretic    ED Course  Procedures (including critical care time)  Labs Reviewed  URINALYSIS, ROUTINE W REFLEX  MICROSCOPIC - Abnormal; Notable for the following:    Color, Urine AMBER (*)  BIOCHEMICALS MAY BE AFFECTED BY COLOR   APPearance CLOUDY (*)     Specific Gravity, Urine 1.031 (*)     Hgb urine dipstick LARGE (*)     Bilirubin Urine SMALL (*)     Protein, ur 30 (*)     Leukocytes, UA TRACE (*)     All other components within normal limits  URINE MICROSCOPIC-ADD ON - Abnormal; Notable for the following:    Squamous Epithelial / LPF MANY (*)     Bacteria, UA FEW (*)     All other components within normal limits  POCT PREGNANCY, URINE  URINE CULTURE   No results found.   No diagnosis found.    MDM  HA, shoulder pain Pt HA treated and improved while in ED.  Presentation is like pts typical HA and non concerning for [facility], ICH, Meningitis, or temporal arteritis. Pt is afebrile with no focal neuro deficits, nuchal rigidity, or change in vision. Pt is to follow up with PCP to discuss prophylactic medication. Pt verbalizes understanding and is agreeable with plan to dc.          Jaci Carrel, [place] 11/27/12 [phone]

## 2012-11-26 NOTE — ED Notes (Signed)
Pt states she has a bad migraine which started at 1900, describes left shoulder pain, left thumb pain (keeps locking up on her). Advil for H/A which did not help at all.  Emesis x 1 and remains nauseated.

## 2012-11-27 LAB — URINALYSIS, ROUTINE W REFLEX MICROSCOPIC
Glucose, UA: NEGATIVE mg/dL
Ketones, ur: NEGATIVE mg/dL
Nitrite: NEGATIVE
Protein, ur: 30 mg/dL — AB
Specific Gravity, Urine: 1.031 — ABNORMAL HIGH (ref 1.005–1.030)
Urobilinogen, UA: 1 mg/dL (ref 0.0–1.0)
pH: 6 (ref 5.0–8.0)

## 2012-11-27 LAB — URINE MICROSCOPIC-ADD ON

## 2012-11-27 MED ORDER — CYCLOBENZAPRINE HCL 10 MG PO TABS: 10.0000 mg | ORAL_TABLET | Freq: Three times a day (TID) | ORAL | Status: DC | PRN

## 2012-11-27 MED ORDER — NAPROXEN 500 MG PO TABS: 500.0000 mg | ORAL_TABLET | Freq: Two times a day (BID) | ORAL | Status: DC

## 2012-11-27 NOTE — ED Notes (Signed)
Discharge instructions reviewed w/ pt., verbalizes understanding. Two prescriptions provided at discharge. 

## 2012-11-27 NOTE — ED Provider Notes (Signed)
Medical screening examination/treatment/procedure(s) were performed by non-physician practitioner and as supervising physician I was immediately available for consultation/collaboration.   Dione Booze, MD 11/27/12 1504

## 2012-11-28 ENCOUNTER — Telehealth: Payer: Self-pay

## 2012-11-28 LAB — URINE CULTURE: Colony Count: 50000

## 2012-11-28 NOTE — Telephone Encounter (Signed)
Lm on vm rgd msg informed skylar not in stock yet if any additional question can call back

## 2012-12-03 ENCOUNTER — Telehealth: Payer: Self-pay | Admitting: Obstetrics and Gynecology

## 2012-12-03 ENCOUNTER — Encounter (HOSPITAL_COMMUNITY): Payer: Self-pay | Admitting: *Deleted

## 2012-12-03 ENCOUNTER — Inpatient Hospital Stay (HOSPITAL_COMMUNITY)
Admission: AD | Admit: 2012-12-03 | Discharge: 2012-12-03 | Disposition: A | Discharge status: Home / Self Care | Payer: Medicaid Other | Source: Ambulatory Visit | Attending: Obstetrics and Gynecology | Admitting: Obstetrics and Gynecology

## 2012-12-03 DIAGNOSIS — R51 Headache: Secondary | ICD-10-CM | POA: Insufficient documentation

## 2012-12-03 DIAGNOSIS — R109 Unspecified abdominal pain: Secondary | ICD-10-CM | POA: Insufficient documentation

## 2012-12-03 DIAGNOSIS — R112 Nausea with vomiting, unspecified: Secondary | ICD-10-CM | POA: Insufficient documentation

## 2012-12-03 LAB — URINE MICROSCOPIC-ADD ON

## 2012-12-03 LAB — URINALYSIS, ROUTINE W REFLEX MICROSCOPIC
Bilirubin Urine: NEGATIVE
Glucose, UA: NEGATIVE mg/dL
Ketones, ur: NEGATIVE mg/dL
Leukocytes, UA: NEGATIVE
Nitrite: NEGATIVE
Protein, ur: NEGATIVE mg/dL
Specific Gravity, Urine: >1.03 — ABNORMAL HIGH (ref 1.005–1.030)
Urobilinogen, UA: 0.2 mg/dL (ref 0.0–1.0)
pH: 6 (ref 5.0–8.0)

## 2012-12-03 LAB — POCT PREGNANCY, URINE: Preg Test, Ur: NEGATIVE

## 2012-12-03 LAB — HCG, QUANTITATIVE, PREGNANCY: hCG, Beta Chain, Quant, S: <1 m[IU]/mL (ref <5)

## 2012-12-03 MED ORDER — ONDANSETRON 8 MG PO TBDP
8.0000 mg | ORAL_TABLET | Freq: Once | ORAL | Status: AC
  Administered 2012-12-03: 8 mg via ORAL
  Filled 2012-12-03 (×2): qty 1

## 2012-12-03 MED ORDER — ACETAMINOPHEN 500 MG PO TABS
1000.0000 mg | ORAL_TABLET | Freq: Once | ORAL | Status: AC
  Administered 2012-12-03: 1000 mg via ORAL
  Filled 2012-12-03: qty 2

## 2012-12-03 NOTE — MAU Note (Signed)
Patient presents with complaint of nausea and vomiting and a headache.

## 2012-12-03 NOTE — Telephone Encounter (Signed)
Symptoms started on fri with pain and cramping, Vomit just today. Discussed risk of ectopic or tubal pg life threatening, need to come to [facility] for evaluation. Lavera Guise, CNM

## 2012-12-03 NOTE — MAU Provider Note (Signed)
History     CSN: [id number]  Arrival date and time: 12/03/12 1826   None     Chief Complaint  Patient presents with  . Abdominal Cramping  . Nausea  . Emesis   HPI Comments: Pt is a 32yo female w hx BTL and on LoEstrin for bleeding that arrives unannounced w CC HA, Nausea and vomiting, Headaches have been ongoing off and on, has hx migraines, has been seen in ED 1 wk ago for headache and given flexeril w good results. Also c/o pelvic cramping. LMP 11/10/12, UPT was negative, however pt insisted on blood test, states "tests have been wrong in the past" Has not been taking anything for pain, states "tylenol didn't help yesterday" Denies any vaginal bleeding or discharge, no dysuria.   Abdominal Cramping Associated symptoms include headaches, nausea and vomiting.  Emesis  Associated symptoms include abdominal pain and headaches.      Past Medical History  Diagnosis Date  . Preterm labor     c/s at 36 wks  . Placenta previa     hx and with current pregnancy  . Migraines     last one over 1 month ago  . Anxiety     no meds  . Depression     no meds    Past Surgical History  Procedure Date  . Cesarean section 2010    x 1 at 36 wks in [place]  . Svd 2003    x 1 in [place]  . Wisdom tooth extraction   . Tubal ligation     Family History  Problem Relation Age of Onset  . Cancer Mother   . Hypertension Sister   . Sickle cell trait Daughter   . Asthma Son   . Asthma Paternal Grandmother   . Pseudochol deficiency Neg Hx   . Malignant hyperthermia Neg Hx   . Anesthesia problems Neg Hx   . Hypotension Neg Hx     History  Substance Use Topics  . Smoking status: Current Some Day Smoker -- 0.2 packs/day for 12 years    Types: Cigarettes  . Smokeless tobacco: Never Used  . Alcohol Use: No     Comment: PReviously drank on occ    Allergies: No Known Allergies  Prescriptions prior to admission  Medication Sig Dispense Refill  . albuterol (PROVENTIL HFA;VENTOLIN  HFA) 108 (90 BASE) MCG/ACT inhaler Inhale 1-2 puffs into the lungs every 6 (six) hours as needed. wheezing      . cyclobenzaprine (FLEXERIL) 10 MG tablet Take 1 tablet (10 mg total) by mouth 3 (three) times daily as needed for muscle spasms.  15 tablet  0  . ibuprofen (ADVIL,MOTRIN) 200 MG tablet Take 400 mg by mouth every 8 (eight) hours as needed. For pain.      Marland Kitchen norethindrone-ethinyl estradiol (MICROGESTIN,JUNEL,LOESTRIN) 1-20 MG-MCG tablet Take 1 tablet by mouth daily.  1 Package  11  . naproxen (NAPROSYN) 500 MG tablet Take 1 tablet (500 mg total) by mouth 2 (two) times daily.  30 tablet  0    Review of Systems  HENT: Negative for neck pain.   Eyes: Negative for blurred vision, double vision and photophobia.  Gastrointestinal: Positive for nausea, vomiting and abdominal pain.       Pelvic cramping   Musculoskeletal: Negative for back pain.  Neurological: Positive for headaches.  All other systems reviewed and are negative.   Physical Exam   Blood pressure 124/88, pulse 96, temperature 98.2 F (36.8 C), temperature source Oral,  resp. rate 18, height 5\' 3"  (1.6 m), weight 186 lb (84.369 kg), last menstrual period 11/10/2012.  Physical Exam  Nursing note and vitals reviewed. Constitutional: She is oriented to person, place, and time. She appears well-developed and well-nourished.       Pt was asleep upon my arrival in room, woke easily   HENT:  Head: Normocephalic.  Eyes: Pupils are equal, round, and reactive to light.  Neck: Normal range of motion. Neck supple.  Cardiovascular: Normal rate, regular rhythm and normal heart sounds.   Respiratory: Effort normal and breath sounds normal.  GI: Soft. Bowel sounds are normal. She exhibits no distension and no mass. There is no tenderness. There is no rebound and no guarding.  Genitourinary:       deferred  Musculoskeletal: Normal range of motion. She exhibits no edema and no tenderness.  Neurological: She is alert and oriented to  person, place, and time. She has normal reflexes. No cranial nerve deficit. Coordination normal.  Skin: Skin is warm and dry.  Psychiatric: She has a normal mood and affect. Her behavior is normal.    MAU Course  Procedures   zofran 8mg  ODT Tylenol 1000mg    Assessment and Plan  BHCG - neg PE - WNL Nausea improved after zofran Headache improved after tylenol   Recommend f/u PCP and headache center as previously recommended Tylenol and motrin ATC for 24 hours If no menses in 2wks take pregnancy test, if still no menses f/u office w Dr Normand Sloop  [facility] home in stable condition   Deitrich Steve M 12/03/2012, 9:41 PM

## 2012-12-04 ENCOUNTER — Other Ambulatory Visit: Payer: Self-pay | Admitting: Obstetrics and Gynecology

## 2012-12-04 MED ORDER — PROMETHAZINE HCL 12.5 MG PO TABS: 12.5000 mg | ORAL_TABLET | Freq: Four times a day (QID) | ORAL | Status: DC | PRN

## 2012-12-06 NOTE — Progress Notes (Signed)
Can you schd pt for skyla insertion thanks

## 2012-12-19 ENCOUNTER — Encounter: Payer: Self-pay | Admitting: Obstetrics and Gynecology

## 2012-12-19 ENCOUNTER — Ambulatory Visit: Payer: Medicaid Other | Admitting: Obstetrics and Gynecology

## 2012-12-19 ENCOUNTER — Ambulatory Visit: Payer: Medicaid Other

## 2012-12-19 VITALS — BP 130/78 | Wt 186.0 lb

## 2012-12-19 DIAGNOSIS — IMO0001 Reserved for inherently not codable concepts without codable children: Secondary | ICD-10-CM

## 2012-12-19 DIAGNOSIS — Z975 Presence of (intrauterine) contraceptive device: Secondary | ICD-10-CM

## 2012-12-19 MED ORDER — LEVONORGESTREL 20 MCG/24HR IU IUD
INTRAUTERINE_SYSTEM | Freq: Once | INTRAUTERINE | Status: AC
  Administered 2012-12-19: 1 via INTRAUTERINE

## 2012-12-19 NOTE — Patient Instructions (Signed)
Intrauterine Device Insertion Care After Refer to this sheet in the next few weeks. These instructions provide you with information on caring for yourself after your procedure. Your caregiver may also give you more specific instructions. Your treatment has been planned according to current medical practices, but problems sometimes occur. Call your caregiver if you have any problems or questions after your procedure. HOME CARE INSTRUCTIONS   Only take over-the-counter or prescription medicines for pain, discomfort, or fever as directed by your caregiver. Do not use aspirin. This may increase bleeding.  Check your IUD to make sure it is in place before you resume sexual activity. You should be able to feel the strings. If you cannot feel the strings, something may be wrong. The IUD may have fallen out of the uterus, or the uterus may have been punctured (perforated) during placement. Also, if the strings are getting longer, it may mean that the IUD is being forced out of the uterus. You no longer have full protection from pregnancy if any of these problems occur.  You may resume sexual intercourse if you are not having problems with the IUD. The IUD is considered immediately effective.  You may resume normal activities.  Keep all follow-up appointments to be sure your IUD has remained in place. After the first exam, yearly exams are advised, unless you cannot feel the strings of your IUD.  Continue to check that the IUD is still in place by feeling for the strings after every menstrual period. SEEK MEDICAL CARE IF:   You have bleeding that is heavier or lasts longer than a normal menstrual cycle.  You have a fever.  You have increasing cramps or abdominal pain not relieved with medicine.  You have abdominal pain that does not seem to be related to the same area of earlier cramping and pain.  You are lightheaded, unusually weak, or faint.  You have abnormal vaginal discharge or  smells.  You have pain during sexual intercourse.  You cannot feel the IUD strings, or the IUD string has gotten longer.  You feel the IUD at the opening of the cervix in the vagina.  You think you are pregnant, or you miss your menstrual period.  The IUD string is hurting your sex partner. Document Released: 06/29/2011 Document Revised: 01/23/2012 Document Reviewed: 06/29/2011 [employer] Patient Information 2013 [employer].  

## 2012-12-19 NOTE — Progress Notes (Signed)
IUD: mirena LOT#: [id number] Exp: 8/16 UPT: negative GC/CHLAMYDIA: will do today CONSENT SIGNED: yes DISINFECTION WITH betadine X3 UTERUS SOUNDED AT 8.5 CM IUD INSERTED PER PROTOCOL: yes COMPLICATION: none PATIENT INSTRUCTED TO CALL IS FEVER OR ABNORMAL PAIN:yes PATIENT INSTRUCTED ON HOW TO CHECK IUD STRINGS: yes FOLLOW UP APPT: 4 weeks.  US done afterprocedure. IUD in correct position.

## 2012-12-20 ENCOUNTER — Other Ambulatory Visit: Payer: Self-pay | Admitting: Obstetrics and Gynecology

## 2012-12-20 DIAGNOSIS — Z975 Presence of (intrauterine) contraceptive device: Secondary | ICD-10-CM

## 2012-12-20 LAB — GC/CHLAMYDIA PROBE AMP
CT Probe RNA: NEGATIVE
GC Probe RNA: NEGATIVE

## 2012-12-21 ENCOUNTER — Other Ambulatory Visit: Payer: Self-pay | Admitting: Obstetrics and Gynecology

## 2012-12-21 MED ORDER — CYCLOBENZAPRINE HCL 10 MG PO TABS: 10.0000 mg | ORAL_TABLET | Freq: Three times a day (TID) | ORAL | Status: DC | PRN

## 2012-12-26 ENCOUNTER — Telehealth: Payer: Self-pay

## 2012-12-26 NOTE — Telephone Encounter (Signed)
PC from pt stating she is having HA and cramping post Mirena. Pt states she knew to expect cramping and spotting. States her cramping is better but Ibuprofen doesn't really help.  Also stated she was having some vaginal irritation and breast pain.  Pt denies d/c or odor.  No change is hygiene habits. Suggested that she try ibuprofen 800mg  q 8 hours. To take with food for stomach protection.  Pt to call if pain worsens.  If irritation continues or if d/c occurs she is to call for evaluation.  Pt agreeable.  ld

## 2013-01-07 ENCOUNTER — Encounter (HOSPITAL_COMMUNITY): Payer: Self-pay | Admitting: *Deleted

## 2013-01-07 ENCOUNTER — Telehealth: Payer: Self-pay | Admitting: Obstetrics and Gynecology

## 2013-01-07 ENCOUNTER — Inpatient Hospital Stay (HOSPITAL_COMMUNITY): Payer: Medicaid Other

## 2013-01-07 ENCOUNTER — Inpatient Hospital Stay (HOSPITAL_COMMUNITY)
Admission: AD | Admit: 2013-01-07 | Discharge: 2013-01-08 | Disposition: A | Discharge status: Hospice | Payer: Medicaid Other | Source: Ambulatory Visit | Attending: Obstetrics and Gynecology | Admitting: Obstetrics and Gynecology

## 2013-01-07 DIAGNOSIS — R109 Unspecified abdominal pain: Secondary | ICD-10-CM | POA: Insufficient documentation

## 2013-01-07 DIAGNOSIS — N949 Unspecified condition associated with female genital organs and menstrual cycle: Secondary | ICD-10-CM | POA: Insufficient documentation

## 2013-01-07 DIAGNOSIS — N390 Urinary tract infection, site not specified: Secondary | ICD-10-CM | POA: Insufficient documentation

## 2013-01-07 HISTORY — DX: Unspecified asthma, uncomplicated: J45.909

## 2013-01-07 LAB — URINE MICROSCOPIC-ADD ON

## 2013-01-07 LAB — URINALYSIS, ROUTINE W REFLEX MICROSCOPIC
Glucose, UA: 100 mg/dL — AB
Ketones, ur: NEGATIVE mg/dL
Leukocytes, UA: NEGATIVE
Nitrite: POSITIVE — AB
Protein, ur: 100 mg/dL — AB
Specific Gravity, Urine: >1.03 — ABNORMAL HIGH (ref 1.005–1.030)
Urobilinogen, UA: 0.2 mg/dL (ref 0.0–1.0)
pH: 6.5 (ref 5.0–8.0)

## 2013-01-07 LAB — CBC WITH DIFFERENTIAL/PLATELET
Basophils Absolute: 0.1 10*3/uL (ref 0.0–0.1)
Basophils Relative: 1 % (ref 0–1)
Eosinophils Absolute: 0.4 10*3/uL (ref 0.0–0.7)
Eosinophils Relative: 4 % (ref 0–5)
HCT: 40.1 % (ref 36.0–46.0)
Hemoglobin: 13.8 g/dL (ref 12.0–15.0)
Lymphocytes Relative: 27 % (ref 12–46)
Lymphs Abs: 3 10*3/uL (ref 0.7–4.0)
MCH: 27.6 pg (ref 26.0–34.0)
MCHC: 34.4 g/dL (ref 30.0–36.0)
MCV: 80.2 fL (ref 78.0–100.0)
Monocytes Absolute: 0.4 10*3/uL (ref 0.1–1.0)
Monocytes Relative: 3 % (ref 3–12)
Neutro Abs: 7.3 10*3/uL (ref 1.7–7.7)
Neutrophils Relative %: 66 % (ref 43–77)
Platelets: 386 10*3/uL (ref 150–400)
RBC: 5 MIL/uL (ref 3.87–5.11)
RDW: 14.7 % (ref 11.5–15.5)
WBC: 11.1 10*3/uL — ABNORMAL HIGH (ref 4.0–10.5)

## 2013-01-07 NOTE — MAU Note (Signed)
Pt c/o of abd cramping and states it feels like somehting is cutting into her vagina-present since 1800

## 2013-01-07 NOTE — Telephone Encounter (Signed)
TC from patient--advises she is on her way to the hospital due to pain and issues with IUD. Mirena placed 12/19/12 by Dr. Normand Sloop, due to DUB. Has had pain since placement, now feels like "something is cutting me". Will see in MAU.

## 2013-01-08 LAB — GC/CHLAMYDIA PROBE AMP
CT Probe RNA: NEGATIVE
GC Probe RNA: NEGATIVE

## 2013-01-08 LAB — URINE CULTURE
Colony Count: NO GROWTH
Culture: NO GROWTH

## 2013-01-08 MED ORDER — OXYCODONE-ACETAMINOPHEN 5-325 MG PO TABS: 1.0000 | ORAL_TABLET | ORAL | Status: DC | PRN

## 2013-01-08 MED ORDER — KETOROLAC TROMETHAMINE 30 MG/ML IJ SOLN
30.0000 mg | Freq: Once | INTRAMUSCULAR | Status: AC
  Administered 2013-01-08: 30 mg via INTRAMUSCULAR
  Filled 2013-01-08: qty 1

## 2013-01-08 MED ORDER — CEPHALEXIN 500 MG PO CAPS: 500.0000 mg | ORAL_CAPSULE | Freq: Two times a day (BID) | ORAL | Status: DC

## 2013-01-08 NOTE — MAU Provider Note (Signed)
History   32 yo [phone] presents c/o "cutting" sensation in vagina and lower abdominal pain since Mirena IUD placed on 12/19/12 by Dr. Normand Sloop.  Denies dysuria, trauma, lesions, fever, or any other sx.  IUD placed to assist with DUB--reports still having irregular bleeding, but less volume than before IUD placed.  No IC since IUD placed.  Reports was having abdominal pain before IUD placed, but "cutting" sensation in vagina just started today.  Patient Active Problem List  Diagnosis  . Abdominal pain complicating pregnancy  . Gastroschisis, fetal, affecting care of mother, antepartum  . Migraines  . Anxiety  . Depression     Chief Complaint  Patient presents with  . Abdominal Cramping    OB History   Grav Para Term Preterm Abortions TAB SAB Ect Mult Living   3 3 1 2  0 0 0 0 0 3      Past Medical History  Diagnosis Date  . Preterm labor     c/s at 36 wks  . Placenta previa     hx and with current pregnancy  . Migraines     last one over 1 month ago  . Anxiety     no meds  . Depression     no meds  . Asthma     Past Surgical History  Procedure Laterality Date  . Cesarean section  2010    x 1 at 36 wks in [place]  . Svd  2003    x 1 in [place]  . Wisdom tooth extraction    . Tubal ligation      Family History  Problem Relation Age of Onset  . Cancer Mother   . Hypertension Sister   . Sickle cell trait Daughter   . Asthma Son   . Asthma Paternal Grandmother   . Pseudochol deficiency Neg Hx   . Malignant hyperthermia Neg Hx   . Anesthesia problems Neg Hx   . Hypotension Neg Hx     History  Substance Use Topics  . Smoking status: Current Some Day Smoker -- 0.20 packs/day for 12 years    Types: Cigarettes  . Smokeless tobacco: Never Used  . Alcohol Use: No     Comment: PReviously drank on occ    Allergies: No Known Allergies  No prescriptions prior to admission    Physical Exam   Blood pressure 116/69, pulse 72, temperature 99.1 F (37.3 C),  temperature source Oral, resp. rate 16, height 5\' 3"  (1.6 m), weight 198 lb (89.812 kg), last menstrual period 11/10/2012, SpO2 100.00%, unknown if currently breastfeeding.  Chest clear Heart RRR without murmur Abd soft, NT Pelvic--small amount dark blood in vault.  Cervix closed, long, NT.  IUD string visible at cervical os, but flexible, with string extending to left side of posterior fornix.  No pain elicited when string manipulated in vault.   Vagina wall sensitive just inside introitus on right, but no visible lesions, erythema, or trauma.   Uterus--small, NT. Negative CVAT. Ext WNL.   Results for orders placed during the hospital encounter of 01/07/13 (from the past 24 hour(s))  CBC WITH DIFFERENTIAL     Status: Abnormal   Collection Time    01/07/13  9:15 PM      Result Value Range   WBC 11.1 (*) 4.0 - 10.5 K/uL   RBC 5.00  3.87 - 5.11 MIL/uL   Hemoglobin 13.8  12.0 - 15.0 g/dL   HCT 29.5  28.4 - 13.2 %   MCV  80.2  78.0 - 100.0 fL   MCH 27.6  26.0 - 34.0 pg   MCHC 34.4  30.0 - 36.0 g/dL   RDW 45.4  09.8 - 11.9 %   Platelets 386  150 - 400 K/uL   Neutrophils Relative 66  43 - 77 %   Neutro Abs 7.3  1.7 - 7.7 K/uL   Lymphocytes Relative 27  12 - 46 %   Lymphs Abs 3.0  0.7 - 4.0 K/uL   Monocytes Relative 3  3 - 12 %   Monocytes Absolute 0.4  0.1 - 1.0 K/uL   Eosinophils Relative 4  0 - 5 %   Eosinophils Absolute 0.4  0.0 - 0.7 K/uL   Basophils Relative 1  0 - 1 %   Basophils Absolute 0.1  0.0 - 0.1 K/uL  URINALYSIS, ROUTINE W REFLEX MICROSCOPIC     Status: Abnormal   Collection Time    01/07/13 11:26 PM      Result Value Range   Color, Urine RED (*) YELLOW   APPearance CLOUDY (*) CLEAR   Specific Gravity, Urine >1.030 (*) 1.005 - 1.030   pH 6.5  5.0 - 8.0   Glucose, UA 100 (*) NEGATIVE mg/dL   Hgb urine dipstick LARGE (*) NEGATIVE   Bilirubin Urine MODERATE (*) NEGATIVE   Ketones, ur NEGATIVE  NEGATIVE mg/dL   Protein, ur 147 (*) NEGATIVE mg/dL   Urobilinogen, UA  0.2  0.0 - 1.0 mg/dL   Nitrite POSITIVE (*) NEGATIVE   Leukocytes, UA NEGATIVE  NEGATIVE  URINE MICROSCOPIC-ADD ON     Status: Abnormal   Collection Time    01/07/13 11:26 PM      Result Value Range   Squamous Epithelial / LPF FEW (*) RARE   WBC, UA 3-6  <3 WBC/hpf   RBC / HPF TOO NUMEROUS TO COUNT  <3 RBC/hpf   Bacteria, UA MANY (*) RARE  GC/CHLAMYDIA PROBE AMP     Status: None   Collection Time    01/07/13 11:58 PM      Result Value Range   CT Probe RNA NEGATIVE  NEGATIVE   GC Probe RNA NEGATIVE  NEGATIVE   [place]: IMPRESSION:  Intrauterine device appears to be appropriately located. Uterus  and ovaries are otherwise unremarkable.   ED Course  S/P Mirena placement 2/5--vaginal and abdominal pain Hx pelvic pain UTI  Plan: GC, chlamydia done. Urine to culture Rx Keflex 500 mg po BID x 7 days. Toradol 30 mg IM in MAU--patient declines oral form due to gastric upset after taking oral form in past Rx Vicodin  Will consult with Dr. Normand Sloop regarding patient's issues and f/u with patient. Keep scheduled IUD f/u 3/7 with Dr. Normand Sloop, or call prn.   Nigel Bridgeman CNM, [place] 01/08/2013

## 2013-01-12 ENCOUNTER — Telehealth: Payer: Self-pay | Admitting: Obstetrics and Gynecology

## 2013-01-12 NOTE — Telephone Encounter (Signed)
TC from pt w c/o having headaches and nausea/vomiting when taking keflex that was rx'd for probable UTI this week. rv'd UA cx results were neg. Instructed pt to stop taking keflex, may take motrin/tylenol OTC for HA

## 2013-01-14 ENCOUNTER — Emergency Department (HOSPITAL_COMMUNITY)
Admission: EM | Admit: 2013-01-14 | Discharge: 2013-01-14 | Disposition: A | Discharge status: Home / Self Care | Payer: Medicaid Other | Attending: Emergency Medicine | Admitting: Emergency Medicine

## 2013-01-14 ENCOUNTER — Encounter (HOSPITAL_COMMUNITY): Payer: Self-pay | Admitting: Emergency Medicine

## 2013-01-14 DIAGNOSIS — M545 Low back pain, unspecified: Secondary | ICD-10-CM | POA: Insufficient documentation

## 2013-01-14 DIAGNOSIS — Z8742 Personal history of other diseases of the female genital tract: Secondary | ICD-10-CM | POA: Insufficient documentation

## 2013-01-14 DIAGNOSIS — Z8659 Personal history of other mental and behavioral disorders: Secondary | ICD-10-CM | POA: Insufficient documentation

## 2013-01-14 DIAGNOSIS — Z8751 Personal history of pre-term labor: Secondary | ICD-10-CM | POA: Insufficient documentation

## 2013-01-14 DIAGNOSIS — Z3202 Encounter for pregnancy test, result negative: Secondary | ICD-10-CM | POA: Insufficient documentation

## 2013-01-14 DIAGNOSIS — R112 Nausea with vomiting, unspecified: Secondary | ICD-10-CM | POA: Insufficient documentation

## 2013-01-14 DIAGNOSIS — Z9851 Tubal ligation status: Secondary | ICD-10-CM | POA: Insufficient documentation

## 2013-01-14 DIAGNOSIS — Z8709 Personal history of other diseases of the respiratory system: Secondary | ICD-10-CM | POA: Insufficient documentation

## 2013-01-14 LAB — URINALYSIS, ROUTINE W REFLEX MICROSCOPIC
Bilirubin Urine: NEGATIVE
Glucose, UA: NEGATIVE mg/dL
Ketones, ur: NEGATIVE mg/dL
Leukocytes, UA: NEGATIVE
Nitrite: NEGATIVE
Protein, ur: NEGATIVE mg/dL
Specific Gravity, Urine: 1.024 (ref 1.005–1.030)
Urobilinogen, UA: 0.2 mg/dL (ref 0.0–1.0)
pH: 6 (ref 5.0–8.0)

## 2013-01-14 LAB — POCT I-STAT, CHEM 8
BUN: 5 mg/dL — ABNORMAL LOW (ref 6–23)
Calcium, Ion: 1.2 mmol/L (ref 1.12–1.23)
Chloride: 106 mEq/L (ref 96–112)
Creatinine, Ser: 0.5 mg/dL (ref 0.50–1.10)
Glucose, Bld: 88 mg/dL (ref 70–99)
HCT: 44 % (ref 36.0–46.0)
Hemoglobin: 15 g/dL (ref 12.0–15.0)
Potassium: 3.4 mEq/L — ABNORMAL LOW (ref 3.5–5.1)
Sodium: 142 mEq/L (ref 135–145)
TCO2: 24 mmol/L (ref 0–100)

## 2013-01-14 LAB — URINE MICROSCOPIC-ADD ON

## 2013-01-14 LAB — POCT PREGNANCY, URINE: Preg Test, Ur: NEGATIVE

## 2013-01-14 MED ORDER — HYDROCODONE-ACETAMINOPHEN 5-325 MG PO TABS: 1.0000 | ORAL_TABLET | ORAL | Status: DC | PRN

## 2013-01-14 MED ORDER — PROMETHAZINE HCL 25 MG PO TABS: 25.0000 mg | ORAL_TABLET | Freq: Four times a day (QID) | ORAL | Status: DC | PRN

## 2013-01-14 MED ORDER — OXYCODONE-ACETAMINOPHEN 5-325 MG PO TABS
1.0000 | ORAL_TABLET | Freq: Once | ORAL | Status: AC
Start: 2013-01-14 — End: 2013-01-14
  Administered 2013-01-14: 1 via ORAL
  Filled 2013-01-14: qty 1

## 2013-01-14 MED ORDER — ONDANSETRON 8 MG PO TBDP
8.0000 mg | ORAL_TABLET | Freq: Once | ORAL | Status: AC
  Administered 2013-01-14: 8 mg via ORAL
  Filled 2013-01-14: qty 1

## 2013-01-14 MED ORDER — OXYCODONE-ACETAMINOPHEN 5-325 MG PO TABS
1.0000 | ORAL_TABLET | Freq: Once | ORAL | Status: AC
  Administered 2013-01-14: 1 via ORAL
  Filled 2013-01-14: qty 1

## 2013-01-14 NOTE — ED Notes (Signed)
Per EMS pt from home, waiting outside for ems, ambulatory, complaining of headache and back pain, no injury, headache started Thursday, n/v started Friday and ended Friday, lower back pain started today, having menstrual cycle problems, states it's heavy.

## 2013-01-14 NOTE — ED Provider Notes (Signed)
History     CSN: [id number]  Arrival date & time 01/14/13  1622   First MD Initiated Contact with Patient 01/14/13 1745      Chief Complaint  Patient presents with  . Headache  . Back Pain  . Nausea     The history is provided by the patient.   patient ports 6 weeks of low back pain that seemed to be worsening today.  She reports the pain makes it difficult for her to walk or stand.  No urinary symptoms.  She recently was seen by her OB/GYN had an ultrasound that demonstrated a right ED was in good place and her gonorrhea and Chlamydia were negative.  The patient denies vaginal symptoms.  No abdominal pain.  She was brought to the emergency department by EMS and she was ambulatory at her house.  No recent falls or trauma.  She had nausea vomiting last week but currently is without nausea vomiting fever or chills.  Past Medical History  Diagnosis Date  . Preterm labor     c/s at 36 wks  . Placenta previa     hx and with current pregnancy  . Migraines     last one over 1 month ago  . Anxiety     no meds  . Depression     no meds  . Asthma     Past Surgical History  Procedure Laterality Date  . Cesarean section  2010    x 1 at 36 wks in [place]  . Svd  2003    x 1 in [place]  . Wisdom tooth extraction    . Tubal ligation      Family History  Problem Relation Age of Onset  . Cancer Mother   . Hypertension Sister   . Sickle cell trait Daughter   . Asthma Son   . Asthma Paternal Grandmother   . Pseudochol deficiency Neg Hx   . Malignant hyperthermia Neg Hx   . Anesthesia problems Neg Hx   . Hypotension Neg Hx     History  Substance Use Topics  . Smoking status: Current Some Day Smoker -- 0.20 packs/day for 12 years    Types: Cigarettes  . Smokeless tobacco: Never Used  . Alcohol Use: No     Comment: PReviously drank on occ    OB History   Grav Para Term Preterm Abortions TAB SAB Ect Mult Living   3 3 1 2  0 0 0 0 0 3      Review of Systems   Musculoskeletal: Positive for back pain.  Neurological: Positive for headaches.  All other systems reviewed and are negative.    Allergies  Review of patient's allergies indicates no known allergies.  Home Medications   Current Outpatient Rx  Name  Route  Sig  Dispense  Refill  . oxyCODONE-acetaminophen (PERCOCET/ROXICET) 5-325 MG per tablet   Oral   Take 1 tablet by mouth every 4 (four) hours as needed (pain).         . promethazine (PHENERGAN) 12.5 MG tablet   Oral   Take 12.5 mg by mouth every 6 (six) hours as needed for nausea.         Marland Kitchen HYDROcodone-acetaminophen (NORCO/VICODIN) 5-325 MG per tablet   Oral   Take 1 tablet by mouth every 4 (four) hours as needed for pain.   12 tablet   0   . promethazine (PHENERGAN) 25 MG tablet   Oral   Take 1 tablet (25  mg total) by mouth every 6 (six) hours as needed for nausea.   30 tablet   0     BP 129/77  Pulse 97  Temp(Src) 97.7 F (36.5 C) (Oral)  Resp 19  SpO2 100%  LMP 01/10/2013  Physical Exam  Nursing note and vitals reviewed. Constitutional: She is oriented to person, place, and time. She appears well-developed and well-nourished. No distress.  HENT:  Head: Normocephalic and atraumatic.  Eyes: EOM are normal.  Neck: Normal range of motion.  Cardiovascular: Normal rate, regular rhythm and normal heart sounds.   Pulmonary/Chest: Effort normal and breath sounds normal.  Abdominal: Soft. She exhibits no distension. There is no tenderness.  Musculoskeletal: Normal range of motion.  Neurological: She is alert and oriented to person, place, and time.  Skin: Skin is warm and dry.  Psychiatric: She has a normal mood and affect. Judgment normal.    ED Course  Procedures (including critical care time)  Labs Reviewed  URINALYSIS, ROUTINE W REFLEX MICROSCOPIC - Abnormal; Notable for the following:    Hgb urine dipstick MODERATE (*)    All other components within normal limits  POCT I-STAT, CHEM 8 - Abnormal;  Notable for the following:    Potassium 3.4 (*)    BUN 5 (*)    All other components within normal limits  URINE MICROSCOPIC-ADD ON  POCT PREGNANCY, URINE   No results found.   1. Low back pain       MDM  6 weeks of back pain without lower extremity weakness.  The patient is been ambulatory today.  Feels better after pain medicine.  Urine labs without significant abnormality.  Discharge home in good condition.  The patient became slightly nauseated after pain medicine in the ER.        Lyanne Co, MD 01/14/13 2223

## 2013-01-14 NOTE — ED Notes (Signed)
States that she has extreme lower back pain that makes it difficult for her to stand or walk. Denies dysuria. States that she was dx with UTI last week and urine culture was negative. States that she has not lifted anything abnormally.

## 2013-01-16 ENCOUNTER — Telehealth: Payer: Self-pay

## 2013-01-16 NOTE — Telephone Encounter (Signed)
Lm on vm tcb rgd msg 

## 2013-01-16 NOTE — Telephone Encounter (Signed)
Spoke with pt rgd msg pt c/o headaches while on keflex pt states stopped keflex and still having headaches advised pt to f/u with neurologist rgd frequent headaches pt states headaches started once iud insertion advised pt can discuss at visit on 01/22/13 pt voice understanding

## 2013-01-18 ENCOUNTER — Encounter: Payer: Medicaid Other | Admitting: Obstetrics and Gynecology

## 2013-01-22 ENCOUNTER — Encounter: Payer: Self-pay | Admitting: Obstetrics and Gynecology

## 2013-01-22 ENCOUNTER — Ambulatory Visit: Payer: Medicaid Other | Admitting: Obstetrics and Gynecology

## 2013-01-22 VITALS — BP 110/80 | Wt 188.0 lb

## 2013-01-22 DIAGNOSIS — M549 Dorsalgia, unspecified: Secondary | ICD-10-CM

## 2013-01-22 DIAGNOSIS — Z30431 Encounter for routine checking of intrauterine contraceptive device: Secondary | ICD-10-CM

## 2013-01-22 DIAGNOSIS — G8929 Other chronic pain: Secondary | ICD-10-CM

## 2013-01-22 DIAGNOSIS — R519 Headache, unspecified: Secondary | ICD-10-CM

## 2013-01-22 NOTE — Patient Instructions (Signed)
Back Pain, Adult Low back pain is very common. About 1 in 5 people have back pain.The cause of low back pain is rarely dangerous. The pain often gets better over time.About half of people with a sudden onset of back pain feel better in just 2 weeks. About 8 in 10 people feel better by 6 weeks.  CAUSES Some common causes of back pain include:  Strain of the muscles or ligaments supporting the spine.  Wear and tear (degeneration) of the spinal discs.  Arthritis.  Direct injury to the back. DIAGNOSIS Most of the time, the direct cause of low back pain is not known.However, back pain can be treated effectively even when the exact cause of the pain is unknown.Answering your caregiver's questions about your overall health and symptoms is one of the most accurate ways to make sure the cause of your pain is not dangerous. If your caregiver needs more information, he or she may order lab work or imaging tests (X-rays or MRIs).However, even if imaging tests show changes in your back, this usually does not require surgery. HOME CARE INSTRUCTIONS For many people, back pain returns.Since low back pain is rarely dangerous, it is often a condition that people can learn to manageon their own.   Remain active. It is stressful on the back to sit or stand in one place. Do not sit, drive, or stand in one place for more than 30 minutes at a time. Take short walks on level surfaces as soon as pain allows.Try to increase the length of time you walk each day.  Do not stay in bed.Resting more than 1 or 2 days can delay your recovery.  Do not avoid exercise or work.Your body is made to move.It is not dangerous to be active, even though your back may hurt.Your back will likely heal faster if you return to being active before your pain is gone.  Pay attention to your body when you bend and lift. Many people have less discomfortwhen lifting if they bend their knees, keep the load close to their bodies,and  avoid twisting. Often, the most comfortable positions are those that put less stress on your recovering back.  Find a comfortable position to sleep. Use a firm mattress and lie on your side with your knees slightly bent. If you lie on your back, put a pillow under your knees.  Only take over-the-counter or prescription medicines as directed by your caregiver. Over-the-counter medicines to reduce pain and inflammation are often the most helpful.Your caregiver may prescribe muscle relaxant drugs.These medicines help dull your pain so you can more quickly return to your normal activities and healthy exercise.  Put ice on the injured area.  Put ice in a plastic bag.  Place a towel between your skin and the bag.  Leave the ice on for 15 to 20 minutes, 3 to 4 times a day for the first 2 to 3 days. After that, ice and heat may be alternated to reduce pain and spasms.  Ask your caregiver about trying back exercises and gentle massage. This may be of some benefit.  Avoid feeling anxious or stressed.Stress increases muscle tension and can worsen back pain.It is important to recognize when you are anxious or stressed and learn ways to manage it.Exercise is a great option. SEEK MEDICAL CARE IF:  You have pain that is not relieved with rest or medicine.  You have pain that does not improve in 1 week.  You have new symptoms.  You are generally   not feeling well. SEEK IMMEDIATE MEDICAL CARE IF:   You have pain that radiates from your back into your legs.  You develop new bowel or bladder control problems.  You have unusual weakness or numbness in your arms or legs.  You develop nausea or vomiting.  You develop abdominal pain.  You feel faint. Document Released: 10/31/2005 Document Revised: 05/01/2012 Document Reviewed: 03/21/2011 [employer] Patient Information 2013 [employer].  

## 2013-01-22 NOTE — Progress Notes (Signed)
Complaints: back pain, headaches, nausea .  Pt still with lower back pain. Nothing makes it better or worse.  She was seen by the headache center and feels the meds did not help.  She was seen in MAU for vaginal pain and had an [place] with the IUD inplace.   Strings Visualized:yes Normal Bimanual:yes Other:  Follow up with [facility]:yes Other: to PT for back pain and follow up at the headache center  BP 110/80  Wt 188 lb (85.276 kg)  BMI 33.31 kg/m2  LMP 01/10/2013  Breastfeeding? No

## 2013-01-30 ENCOUNTER — Emergency Department (HOSPITAL_COMMUNITY): Payer: Medicaid Other

## 2013-01-30 ENCOUNTER — Encounter (HOSPITAL_COMMUNITY): Payer: Self-pay

## 2013-01-30 ENCOUNTER — Emergency Department (HOSPITAL_COMMUNITY)
Admission: EM | Admit: 2013-01-30 | Discharge: 2013-01-30 | Disposition: A | Discharge status: Home / Self Care | Payer: Medicaid Other | Attending: Emergency Medicine | Admitting: Emergency Medicine

## 2013-01-30 DIAGNOSIS — R059 Cough, unspecified: Secondary | ICD-10-CM | POA: Insufficient documentation

## 2013-01-30 DIAGNOSIS — Z3202 Encounter for pregnancy test, result negative: Secondary | ICD-10-CM | POA: Insufficient documentation

## 2013-01-30 DIAGNOSIS — R5381 Other malaise: Secondary | ICD-10-CM | POA: Insufficient documentation

## 2013-01-30 DIAGNOSIS — F17200 Nicotine dependence, unspecified, uncomplicated: Secondary | ICD-10-CM | POA: Insufficient documentation

## 2013-01-30 DIAGNOSIS — R1110 Vomiting, unspecified: Secondary | ICD-10-CM

## 2013-01-30 DIAGNOSIS — J45909 Unspecified asthma, uncomplicated: Secondary | ICD-10-CM | POA: Insufficient documentation

## 2013-01-30 DIAGNOSIS — R112 Nausea with vomiting, unspecified: Secondary | ICD-10-CM | POA: Insufficient documentation

## 2013-01-30 DIAGNOSIS — Z8742 Personal history of other diseases of the female genital tract: Secondary | ICD-10-CM | POA: Insufficient documentation

## 2013-01-30 DIAGNOSIS — Z79899 Other long term (current) drug therapy: Secondary | ICD-10-CM | POA: Insufficient documentation

## 2013-01-30 DIAGNOSIS — R05 Cough: Secondary | ICD-10-CM | POA: Insufficient documentation

## 2013-01-30 DIAGNOSIS — Z8751 Personal history of pre-term labor: Secondary | ICD-10-CM | POA: Insufficient documentation

## 2013-01-30 DIAGNOSIS — Z8679 Personal history of other diseases of the circulatory system: Secondary | ICD-10-CM | POA: Insufficient documentation

## 2013-01-30 LAB — CBC
HCT: 41 % (ref 36.0–46.0)
Hemoglobin: 13.7 g/dL (ref 12.0–15.0)
MCH: 27.2 pg (ref 26.0–34.0)
MCHC: 33.4 g/dL (ref 30.0–36.0)
MCV: 81.3 fL (ref 78.0–100.0)
Platelets: 385 10*3/uL (ref 150–400)
RBC: 5.04 MIL/uL (ref 3.87–5.11)
RDW: 14.3 % (ref 11.5–15.5)
WBC: 8.7 10*3/uL (ref 4.0–10.5)

## 2013-01-30 LAB — URINALYSIS, MICROSCOPIC ONLY
Glucose, UA: NEGATIVE mg/dL
Hgb urine dipstick: NEGATIVE
Ketones, ur: NEGATIVE mg/dL
Leukocytes, UA: NEGATIVE
Nitrite: NEGATIVE
Protein, ur: NEGATIVE mg/dL
Specific Gravity, Urine: 1.034 — ABNORMAL HIGH (ref 1.005–1.030)
Urobilinogen, UA: 2 mg/dL — ABNORMAL HIGH (ref 0.0–1.0)
pH: 6 (ref 5.0–8.0)

## 2013-01-30 LAB — BASIC METABOLIC PANEL
BUN: 8 mg/dL (ref 6–23)
CO2: 24 mEq/L (ref 19–32)
Calcium: 9.1 mg/dL (ref 8.4–10.5)
Chloride: 103 mEq/L (ref 96–112)
Creatinine, Ser: 0.5 mg/dL (ref 0.50–1.10)
GFR calc Af Amer: >90 mL/min (ref >90)
GFR calc non Af Amer: >90 mL/min (ref >90)
Glucose, Bld: 64 mg/dL — ABNORMAL LOW (ref 70–99)
Potassium: 3.8 mEq/L (ref 3.5–5.1)
Sodium: 138 mEq/L (ref 135–145)

## 2013-01-30 LAB — POCT PREGNANCY, URINE: Preg Test, Ur: NEGATIVE

## 2013-01-30 MED ORDER — AZITHROMYCIN 250 MG PO TABS: 250.0000 mg | ORAL_TABLET | Freq: Every day | ORAL | Status: DC

## 2013-01-30 MED ORDER — AZITHROMYCIN 250 MG PO TABS
500.0000 mg | ORAL_TABLET | Freq: Once | ORAL | Status: AC
  Administered 2013-01-30: 500 mg via ORAL
  Filled 2013-01-30: qty 2

## 2013-01-30 MED ORDER — SODIUM CHLORIDE 0.9 % IV BOLUS (SEPSIS)
1000.0000 mL | Freq: Once | INTRAVENOUS | Status: AC
  Administered 2013-01-30: 1000 mL via INTRAVENOUS

## 2013-01-30 MED ORDER — ONDANSETRON 8 MG PO TBDP
8.0000 mg | ORAL_TABLET | Freq: Once | ORAL | Status: AC
  Administered 2013-01-30: 8 mg via ORAL
  Filled 2013-01-30: qty 1

## 2013-01-30 MED ORDER — PROMETHAZINE-CODEINE 6.25-10 MG/5ML PO SYRP: 5.0000 mL | ORAL_SOLUTION | Freq: Four times a day (QID) | ORAL | Status: DC | PRN

## 2013-01-30 MED ORDER — PROMETHAZINE HCL 25 MG/ML IJ SOLN
25.0000 mg | Freq: Once | INTRAMUSCULAR | Status: AC
  Administered 2013-01-30: 25 mg via INTRAVENOUS
  Filled 2013-01-30: qty 1

## 2013-01-30 NOTE — ED Notes (Signed)
PA at bedside.

## 2013-01-30 NOTE — ED Provider Notes (Signed)
Medical screening examination/treatment/procedure(s) were performed by non-physician practitioner and as supervising physician I was immediately available for consultation/collaboration.    Celene Kras, MD 01/30/13 [phone]

## 2013-01-30 NOTE — ED Notes (Signed)
Pt given crackers and Sprite, per PA.

## 2013-01-30 NOTE — ED Notes (Signed)
AVW:[id number]<XB> Expected date:<BR> Expected time:<BR> Means of arrival:<BR> Comments:<BR> vomiting

## 2013-01-30 NOTE — ED Notes (Signed)
Nurse attempting to obtain labs with IV start

## 2013-01-30 NOTE — ED Notes (Signed)
Per EMS, Pt, from home, c/o nausea and vomiting x 5 days.  Vitals are stable.  NAD noted.

## 2013-01-30 NOTE — ED Provider Notes (Signed)
History     CSN: [id number]  Arrival date & time 01/30/13  1610   First MD Initiated Contact with Patient 01/30/13 1619      Chief Complaint  Patient presents with  . Nausea  . Emesis    (Consider location/radiation/quality/duration/timing/severity/associated sxs/prior treatment) Patient is a 32 y.o. female presenting with vomiting. The history is provided by the patient. No language interpreter was used.  Emesis Severity:  Severe Timing:  Intermittent Number of daily episodes:  5-10 Able to tolerate:  Liquids Progression:  Unchanged Chronicity:  New Recent urination:  Normal Associated symptoms: no chills and no diarrhea   Pt brought in via EMS.  Pt states she has been vomiting all day. First vomiting episode was Saturday.  States she was fine until today.  Has vomited 5-10 before calling EMS.  Denies fever, diarrhea, known sick contacts, or recent travel. Has been able to tolerate some water and Sprite at home. Also c/o of cough which she tried Nyquil for but threw up.  Mentioned she keeps coughing prior to vomiting but "feels it in her throat" before she has to cough.   Past Medical History  Diagnosis Date  . Preterm labor     c/s at 36 wks  . Placenta previa     hx and with current pregnancy  . Migraines     last one over 1 month ago  . Anxiety     no meds  . Depression     no meds  . Asthma     Past Surgical History  Procedure Laterality Date  . Cesarean section  2010    x 1 at 36 wks in [place]  . Svd  2003    x 1 in [place]  . Wisdom tooth extraction    . Tubal ligation      Family History  Problem Relation Age of Onset  . Cancer Mother   . Hypertension Sister   . Sickle cell trait Daughter   . Asthma Son   . Asthma Paternal Grandmother   . Pseudochol deficiency Neg Hx   . Malignant hyperthermia Neg Hx   . Anesthesia problems Neg Hx   . Hypotension Neg Hx     History  Substance Use Topics  . Smoking status: Current Some Day Smoker -- 0.20  packs/day for 12 years    Types: Cigarettes  . Smokeless tobacco: Never Used  . Alcohol Use: No     Comment: PReviously drank on occ    OB History   Grav Para Term Preterm Abortions TAB SAB Ect Mult Living   3 3 1 2  0 0 0 0 0 3      Review of Systems  Constitutional: Positive for fatigue. Negative for fever, chills and diaphoresis.  Respiratory: Negative for shortness of breath.   Cardiovascular: Negative for chest pain.  Gastrointestinal: Positive for nausea and vomiting. Negative for diarrhea.  Genitourinary: Negative for dysuria and flank pain.  Musculoskeletal: Negative for back pain.    Allergies  Review of patient's allergies indicates no known allergies.  Home Medications   Current Outpatient Rx  Name  Route  Sig  Dispense  Refill  . aspirin-acetaminophen-caffeine (HEADACHE RELIEF) 250-250-65 MG per tablet   Oral   Take 1 tablet by mouth every 6 (six) hours as needed for pain.         Marland Kitchen azithromycin (ZITHROMAX) 250 MG tablet   Oral   Take 1 tablet (250 mg total) by mouth daily. Take first  2 tablets together, then 1 every day until finished.   4 tablet   0     1 tab Q day starting on 01/31/13   . levonorgestrel (MIRENA) 20 MCG/24HR IUD   Intrauterine   1 each by Intrauterine route once.         . promethazine-codeine (PHENERGAN WITH CODEINE) 6.25-10 MG/5ML syrup   Oral   Take 5 mLs by mouth every 6 (six) hours as needed for cough.   100 mL   0     BP 125/82  Pulse 93  Temp(Src) 98.6 F (37 C)  Resp 20  SpO2 99%  LMP 01/10/2013  Physical Exam  Nursing note and vitals reviewed. Constitutional: She appears well-developed and well-nourished. No distress.  HENT:  Head: Normocephalic and atraumatic.  Mouth/Throat: Oropharynx is clear and moist.  Eyes: Conjunctivae are normal.  Neck: Normal range of motion. Neck supple.  Cardiovascular: Normal rate, regular rhythm and normal heart sounds.   Pulmonary/Chest: Effort normal and breath sounds  normal. No respiratory distress. She has no wheezes. She has no rales. She exhibits no tenderness.  Abdominal: Soft. Bowel sounds are normal. She exhibits no distension. There is tenderness ( TTP in epigatrium. ).  Musculoskeletal: Normal range of motion.  Neurological: She is alert.  Skin: Skin is warm and dry. No rash noted. She is not diaphoretic. No erythema. No pallor.    ED Course  Procedures (including critical care time)  Labs Reviewed  URINALYSIS, MICROSCOPIC ONLY - Abnormal; Notable for the following:    Color, Urine AMBER (*)    APPearance CLOUDY (*)    Specific Gravity, Urine 1.034 (*)    Bilirubin Urine SMALL (*)    Urobilinogen, UA 2.0 (*)    Bacteria, UA FEW (*)    Squamous Epithelial / LPF FEW (*)    All other components within normal limits  BASIC METABOLIC PANEL - Abnormal; Notable for the following:    Glucose, Bld 64 (*)    All other components within normal limits  CBC  POCT PREGNANCY, URINE   Dg Chest 2 View  01/30/2013  *RADIOLOGY REPORT*  Clinical Data: Cough, nausea, and vomiting.  CHEST - 2 VIEW  Comparison: Two-view chest 09/08/2012.  Findings: The heart size is normal.  The lungs are clear.  Moderate to rightward scoliosis of the mid thoracic spine is again noted. The visualized soft tissues and bony thorax are otherwise unremarkable.  IMPRESSION:  1.  No acute cardiopulmonary disease. 2.  Scoliosis.   Original Report Authenticated By: Marin Roberts, M.D.      1. Nausea & vomiting   2. Post-tussive vomiting       MDM  DDx: SBO, pneumonia, viral enteritis Plan/assumptions: Zofran, start saline bolus, get labs  Labs: preg-neg.   Re-assessment:7:34 PM pt requested phenergan and states she keeps coughing before she throws up. Will order CXR and istat,chem 8  CXR: nl CBC: nl BMP: hypoglycemia. Rechecked pt, she stated she was feeling better after fluids.   Dx: possible underlying bacterial infection with cough preceding for 5 days,  post-tussive cough  Tx: will prescribe Azithromycin and promethazine-codeine for home to help with cough can post-tussive vomiting.   Follow-up: will have pt f/u with PCP. States she has been referred to a PCP via her gynecologist.  Could not recall name of PCP.    Precautions/Reasons to return immediately: Return to ED if vomiting blood or bile. Unable to keep down fluids.   Pt verbalized understanding and agreed  with tx plan. Vitals: unremarkable. Pt discharged in stable condition.  Discussed pt with attending throughout ED encounter.         Junius Finner, PA-C 01/30/13 1935

## 2013-01-31 LAB — GLUCOSE, CAPILLARY: Glucose-Capillary: 118 mg/dL — ABNORMAL HIGH (ref 70–99)

## 2013-02-16 ENCOUNTER — Emergency Department (HOSPITAL_COMMUNITY)
Admission: EM | Admit: 2013-02-16 | Discharge: 2013-02-17 | Disposition: A | Discharge status: Home / Self Care | Payer: Medicaid Other | Attending: Emergency Medicine | Admitting: Emergency Medicine

## 2013-02-16 ENCOUNTER — Encounter (HOSPITAL_COMMUNITY): Payer: Self-pay | Admitting: Emergency Medicine

## 2013-02-16 DIAGNOSIS — Z79899 Other long term (current) drug therapy: Secondary | ICD-10-CM | POA: Insufficient documentation

## 2013-02-16 DIAGNOSIS — G43909 Migraine, unspecified, not intractable, without status migrainosus: Secondary | ICD-10-CM | POA: Insufficient documentation

## 2013-02-16 DIAGNOSIS — F419 Anxiety disorder, unspecified: Secondary | ICD-10-CM

## 2013-02-16 DIAGNOSIS — Z8751 Personal history of pre-term labor: Secondary | ICD-10-CM | POA: Insufficient documentation

## 2013-02-16 DIAGNOSIS — F4321 Adjustment disorder with depressed mood: Secondary | ICD-10-CM | POA: Insufficient documentation

## 2013-02-16 DIAGNOSIS — R259 Unspecified abnormal involuntary movements: Secondary | ICD-10-CM | POA: Insufficient documentation

## 2013-02-16 DIAGNOSIS — R112 Nausea with vomiting, unspecified: Secondary | ICD-10-CM | POA: Insufficient documentation

## 2013-02-16 DIAGNOSIS — F39 Unspecified mood [affective] disorder: Secondary | ICD-10-CM | POA: Insufficient documentation

## 2013-02-16 DIAGNOSIS — R51 Headache: Secondary | ICD-10-CM | POA: Insufficient documentation

## 2013-02-16 DIAGNOSIS — F3289 Other specified depressive episodes: Secondary | ICD-10-CM | POA: Insufficient documentation

## 2013-02-16 DIAGNOSIS — F17200 Nicotine dependence, unspecified, uncomplicated: Secondary | ICD-10-CM | POA: Insufficient documentation

## 2013-02-16 DIAGNOSIS — Z8742 Personal history of other diseases of the female genital tract: Secondary | ICD-10-CM | POA: Insufficient documentation

## 2013-02-16 DIAGNOSIS — G479 Sleep disorder, unspecified: Secondary | ICD-10-CM | POA: Insufficient documentation

## 2013-02-16 DIAGNOSIS — F329 Major depressive disorder, single episode, unspecified: Secondary | ICD-10-CM | POA: Insufficient documentation

## 2013-02-16 DIAGNOSIS — J45909 Unspecified asthma, uncomplicated: Secondary | ICD-10-CM | POA: Insufficient documentation

## 2013-02-16 DIAGNOSIS — H53149 Visual discomfort, unspecified: Secondary | ICD-10-CM | POA: Insufficient documentation

## 2013-02-16 NOTE — ED Notes (Signed)
Pt . arrived via EMS with a complaint of an anxiety attack.  Pt states she had a migraine that started last Saturday.  Today for some unknown reason she became nauseated and has dry heaves.  Pt states she is shaky but not physical shaking of the body is visible.  Pt states she was on Ativan previously but doesn't have a [prescription at present.

## 2013-02-16 NOTE — ED Notes (Signed)
Bed:WA24<BR> Expected date:<BR> Expected time:<BR> Means of arrival:<BR> Comments:<BR>

## 2013-02-17 MED ORDER — LORAZEPAM 1 MG PO TABS
1.0000 mg | ORAL_TABLET | Freq: Once | ORAL | Status: AC
  Administered 2013-02-17: 1 mg via ORAL
  Filled 2013-02-17: qty 1

## 2013-02-17 MED ORDER — LORAZEPAM 1 MG PO TABS: 1.0000 mg | ORAL_TABLET | Freq: Three times a day (TID) | ORAL | Status: DC | PRN

## 2013-02-17 MED ORDER — METOCLOPRAMIDE HCL 10 MG PO TABS
10.0000 mg | ORAL_TABLET | Freq: Once | ORAL | Status: AC
  Administered 2013-02-17: 10 mg via ORAL
  Filled 2013-02-17: qty 1

## 2013-02-17 MED ORDER — KETOROLAC TROMETHAMINE 60 MG/2ML IM SOLN
60.0000 mg | Freq: Once | INTRAMUSCULAR | Status: DC
  Filled 2013-02-17: qty 2

## 2013-02-17 NOTE — ED Provider Notes (Signed)
History     CSN: [id number]  Arrival date & time 02/16/13  2323   First MD Initiated Contact with Patient 02/17/13 0142      Chief Complaint  Patient presents with  . Panic Attack    (Consider location/radiation/quality/duration/timing/severity/associated sxs/prior treatment) HPI Pt states that her pastor died in a car accident 1 week ago and since, she has had generalized throbbing HA, photophobia, nausea, shaking, crying and anxiety. She has taken OTC pain meds with some relief. HA is identical to past migraines. No fever, chills, focal weakness, numbness. Pt denies SI/HI, hallucinations. States she has a Therapist, sports and will schedule an appointment to see him.  Past Medical History  Diagnosis Date  . Preterm labor     c/s at 36 wks  . Placenta previa     hx and with current pregnancy  . Migraines     last one over 1 month ago  . Anxiety     no meds  . Depression     no meds  . Asthma     Past Surgical History  Procedure Laterality Date  . Cesarean section  2010    x 1 at 36 wks in [place]  . Svd  2003    x 1 in [place]  . Wisdom tooth extraction    . Tubal ligation      Family History  Problem Relation Age of Onset  . Cancer Mother   . Hypertension Sister   . Sickle cell trait Daughter   . Asthma Son   . Asthma Paternal Grandmother   . Pseudochol deficiency Neg Hx   . Malignant hyperthermia Neg Hx   . Anesthesia problems Neg Hx   . Hypotension Neg Hx     History  Substance Use Topics  . Smoking status: Current Some Day Smoker -- 0.20 packs/day for 12 years    Types: Cigarettes  . Smokeless tobacco: Never Used  . Alcohol Use: No     Comment: PReviously drank on occ    OB History   Grav Para Term Preterm Abortions TAB SAB Ect Mult Living   3 3 1 2  0 0 0 0 0 3      Review of Systems  Constitutional: Negative for fever and chills.  HENT: Negative for neck pain.   Eyes: Positive for photophobia.  Respiratory: Negative for shortness of breath.    Cardiovascular: Negative for chest pain.  Gastrointestinal: Positive for nausea and vomiting. Negative for abdominal pain, diarrhea and constipation.  Genitourinary: Negative for dysuria.  Musculoskeletal: Negative for myalgias and back pain.  Skin: Negative for rash and wound.  Neurological: Positive for tremors and headaches. Negative for dizziness, syncope, weakness, light-headedness and numbness.  Psychiatric/Behavioral: Positive for sleep disturbance and dysphoric mood. Negative for suicidal ideas and self-injury. The patient is nervous/anxious.   All other systems reviewed and are negative.    Allergies  Review of patient's allergies indicates no known allergies.  Home Medications   Current Outpatient Rx  Name  Route  Sig  Dispense  Refill  . aspirin-acetaminophen-caffeine (HEADACHE RELIEF) 250-250-65 MG per tablet   Oral   Take 1 tablet by mouth every 6 (six) hours as needed for pain (or headache).          . promethazine (PHENERGAN) 25 MG tablet   Oral   Take 12.5-25 mg by mouth every 6 (six) hours as needed for nausea.         . promethazine-codeine (PHENERGAN WITH CODEINE) 6.25-10 MG/5ML  syrup   Oral   Take 5 mLs by mouth every 6 (six) hours as needed for cough.   100 mL   0   . azithromycin (ZITHROMAX) 250 MG tablet   Oral   Take 250 mg by mouth daily. Take first 2 tablets together, then 1 every day until finished.         . levonorgestrel (MIRENA) 20 MCG/24HR IUD   Intrauterine   1 each by Intrauterine route once.         Marland Kitchen LORazepam (ATIVAN) 1 MG tablet   Oral   Take 1 tablet (1 mg total) by mouth every 8 (eight) hours as needed for anxiety.   6 tablet   0     BP 146/88  Pulse 97  Temp(Src) 98.6 F (37 C) (Oral)  SpO2 100%  LMP 02/04/2013  Breastfeeding? No  Physical Exam  Nursing note and vitals reviewed. Constitutional: She is oriented to person, place, and time. She appears well-developed and well-nourished.  Dysphoric mood. Crying   HENT:  Head: Normocephalic and atraumatic.  Mouth/Throat: Oropharynx is clear and moist.  Eyes: EOM are normal. Pupils are equal, round, and reactive to light.  Neck: Normal range of motion. Neck supple.  Cardiovascular: Normal rate and regular rhythm.   Pulmonary/Chest: Effort normal and breath sounds normal. No respiratory distress. She has no wheezes. She has no rales.  Abdominal: Soft. Bowel sounds are normal. She exhibits no distension. There is no tenderness. There is no rebound and no guarding.  Musculoskeletal: Normal range of motion. She exhibits no edema and no tenderness.  Neurological: She is alert and oriented to person, place, and time.  5/5 motor in all ext, sensation intact  Skin: Skin is warm and dry. No rash noted. No erythema.    ED Course  Procedures (including critical care time)  Labs Reviewed - No data to display No results found.   1. Grief reaction   2. Migraine   3. Anxiety       MDM  Pt tolerating PO's. Refused Toradol. Requesting D/C.         [name], MD 02/17/13 [phone]

## 2013-03-23 ENCOUNTER — Encounter (HOSPITAL_COMMUNITY): Payer: Self-pay

## 2013-03-23 ENCOUNTER — Emergency Department (HOSPITAL_COMMUNITY)
Admission: EM | Admit: 2013-03-23 | Discharge: 2013-03-23 | Disposition: A | Discharge status: Home / Self Care | Payer: Medicaid Other | Attending: Emergency Medicine | Admitting: Emergency Medicine

## 2013-03-23 DIAGNOSIS — J45909 Unspecified asthma, uncomplicated: Secondary | ICD-10-CM | POA: Insufficient documentation

## 2013-03-23 DIAGNOSIS — S99929A Unspecified injury of unspecified foot, initial encounter: Secondary | ICD-10-CM | POA: Insufficient documentation

## 2013-03-23 DIAGNOSIS — F411 Generalized anxiety disorder: Secondary | ICD-10-CM | POA: Insufficient documentation

## 2013-03-23 DIAGNOSIS — F3289 Other specified depressive episodes: Secondary | ICD-10-CM | POA: Insufficient documentation

## 2013-03-23 DIAGNOSIS — Y9289 Other specified places as the place of occurrence of the external cause: Secondary | ICD-10-CM | POA: Insufficient documentation

## 2013-03-23 DIAGNOSIS — Z8751 Personal history of pre-term labor: Secondary | ICD-10-CM | POA: Insufficient documentation

## 2013-03-23 DIAGNOSIS — S8990XA Unspecified injury of unspecified lower leg, initial encounter: Secondary | ICD-10-CM | POA: Insufficient documentation

## 2013-03-23 DIAGNOSIS — Z792 Long term (current) use of antibiotics: Secondary | ICD-10-CM | POA: Insufficient documentation

## 2013-03-23 DIAGNOSIS — Y9389 Activity, other specified: Secondary | ICD-10-CM | POA: Insufficient documentation

## 2013-03-23 DIAGNOSIS — G43909 Migraine, unspecified, not intractable, without status migrainosus: Secondary | ICD-10-CM | POA: Insufficient documentation

## 2013-03-23 DIAGNOSIS — F17200 Nicotine dependence, unspecified, uncomplicated: Secondary | ICD-10-CM | POA: Insufficient documentation

## 2013-03-23 DIAGNOSIS — W010XXA Fall on same level from slipping, tripping and stumbling without subsequent striking against object, initial encounter: Secondary | ICD-10-CM | POA: Insufficient documentation

## 2013-03-23 DIAGNOSIS — F329 Major depressive disorder, single episode, unspecified: Secondary | ICD-10-CM | POA: Insufficient documentation

## 2013-03-23 DIAGNOSIS — Z79899 Other long term (current) drug therapy: Secondary | ICD-10-CM | POA: Insufficient documentation

## 2013-03-23 MED ORDER — METHOCARBAMOL 500 MG PO TABS: 500.0000 mg | ORAL_TABLET | Freq: Two times a day (BID) | ORAL | Status: DC

## 2013-03-23 MED ORDER — TRAMADOL HCL 50 MG PO TABS: 50.0000 mg | ORAL_TABLET | Freq: Four times a day (QID) | ORAL | Status: DC | PRN

## 2013-03-23 MED ORDER — NAPROXEN 500 MG PO TABS: 500.0000 mg | ORAL_TABLET | Freq: Two times a day (BID) | ORAL | Status: DC

## 2013-03-23 NOTE — ED Notes (Signed)
Pt reported having slipped on mud last night c/o RT upper leg pain.  Pt is ambulatory but pain increases when she does.  EMS did not see any bruising or deformity.

## 2013-03-23 NOTE — ED Provider Notes (Signed)
History    This chart was scribed for [name], a non-physician practitioner working with [name], MD by Lewanda Rife, ED Scribe. This patient was seen in room WTR7/WTR7 and the patient's care was started at 1840.    CSN: [id number]  Arrival date & time 03/23/13  1742   First MD Initiated Contact with Patient 03/23/13 1756      Chief Complaint  Patient presents with  . Leg Pain    (Consider location/radiation/quality/duration/timing/severity/associated sxs/prior treatment) The history is provided by the patient.  HPI Comments: Penny [name] is a 32 y.o. female brought in by ambulance, who presents to the Emergency Department complaining of constant moderate right posterior upper leg pain onset last night after slipping on mud and landing on her buttock. Describes pain as "pulling." Denies numbness, weakness, head injury, LOC, and paraesthesias. Denies aggravating and alleviating factors. Reports trying Excedrin, heat, hot bath with no relief of symptoms.   Past Medical History  Diagnosis Date  . Preterm labor     c/s at 36 wks  . Placenta previa     hx and with current pregnancy  . Migraines     last one over 1 month ago  . Anxiety     no meds  . Depression     no meds  . Asthma     Past Surgical History  Procedure Laterality Date  . Cesarean section  2010    x 1 at 36 wks in [place]  . Svd  2003    x 1 in [place]  . Wisdom tooth extraction    . Tubal ligation      Family History  Problem Relation Age of Onset  . Cancer Mother   . Hypertension Sister   . Sickle cell trait Daughter   . Asthma Son   . Asthma Paternal Grandmother   . Pseudochol deficiency Neg Hx   . Malignant hyperthermia Neg Hx   . Anesthesia problems Neg Hx   . Hypotension Neg Hx     History  Substance Use Topics  . Smoking status: Current Every Day Smoker -- 0.20 packs/day for 12 years    Types: Cigarettes  . Smokeless tobacco: Never Used  . Alcohol Use: No      Comment: PReviously drank on occ    OB History   Grav Para Term Preterm Abortions TAB SAB Ect Mult Living   3 3 1 2  0 0 0 0 0 3      Review of Systems A complete 10 system review of systems was obtained and all systems are negative except as noted in the HPI and PMH.     Allergies  Review of patient's allergies indicates no known allergies.  Home Medications   Current Outpatient Rx  Name  Route  Sig  Dispense  Refill  . aspirin-acetaminophen-caffeine (HEADACHE RELIEF) 250-250-65 MG per tablet   Oral   Take 1 tablet by mouth every 6 (six) hours as needed for pain (or headache).          Marland Kitchen azithromycin (ZITHROMAX) 250 MG tablet   Oral   Take 250 mg by mouth daily. Take first 2 tablets together, then 1 every day until finished.         . levonorgestrel (MIRENA) 20 MCG/24HR IUD   Intrauterine   1 each by Intrauterine route once.         Marland Kitchen LORazepam (ATIVAN) 1 MG tablet   Oral   Take 1 tablet (1  mg total) by mouth every 8 (eight) hours as needed for anxiety.   6 tablet   0   . promethazine (PHENERGAN) 25 MG tablet   Oral   Take 12.5-25 mg by mouth every 6 (six) hours as needed for nausea.         . promethazine-codeine (PHENERGAN WITH CODEINE) 6.25-10 MG/5ML syrup   Oral   Take 5 mLs by mouth every 6 (six) hours as needed for cough.   100 mL   0     BP 129/72  Pulse 92  Temp(Src) 98.6 F (37 C) (Oral)  Resp 18  Ht 5' 3.5" (1.613 m)  Wt 192 lb (87.091 kg)  BMI 33.47 kg/m2  SpO2 100%  Breastfeeding? No  Physical Exam  Nursing note and vitals reviewed. Constitutional: She is oriented to person, place, and time. She appears well-developed and well-nourished. No distress.  HENT:  Head: Normocephalic and atraumatic.  Eyes: EOM are normal.  Neck: Neck supple. No tracheal deviation present.  Cardiovascular: Normal rate, regular rhythm, normal heart sounds, intact distal pulses and normal pulses.   Pulses:      Dorsalis pedis pulses are 2+ on the  right side, and 2+ on the left side.  Pulmonary/Chest: Effort normal and breath sounds normal. No respiratory distress.  Musculoskeletal: Normal range of motion.       Right hip: Normal. She exhibits normal range of motion, normal strength, no tenderness, no bony tenderness, no swelling and no deformity.       Right upper leg: She exhibits tenderness. She exhibits no bony tenderness, no swelling, no edema, no deformity and no laceration.  No obvious ecchymosis noted of posterior right thigh.   Neurological: She is alert and oriented to person, place, and time. She has normal strength. No sensory deficit.  Normal gait without ataxia.  Skin: Skin is warm and dry.  Psychiatric: She has a normal mood and affect. Her behavior is normal.    ED Course  Procedures (including critical care time) Medications - No data to display  Labs Reviewed - No data to display No results found.   No diagnosis found.    MDM  Patient presenting with "pulling pain" of the right upper leg after falling last evening.  No bruising, erythema, or edema of the leg.  Patient able to ambulate.  No bony tenderness.  Pain most consistent with a muscle strain.  Patient neurovascularly intact.  Patient stable for discharge.  Return precautions given.  I personally performed the services described in this documentation, which was scribed in my presence. The recorded information has been reviewed and is accurate.      Pascal Lux Lookingglass, PA-C 03/23/13 [phone]

## 2013-04-07 NOTE — ED Provider Notes (Signed)
Medical screening examination/treatment/procedure(s) were performed by non-physician practitioner and as supervising physician I was immediately available for consultation/collaboration.   [name], MD 04/07/13 [phone]

## 2013-05-26 ENCOUNTER — Encounter (HOSPITAL_COMMUNITY): Payer: Self-pay

## 2013-05-26 ENCOUNTER — Emergency Department (HOSPITAL_COMMUNITY)
Admission: EM | Admit: 2013-05-26 | Discharge: 2013-05-26 | Disposition: A | Discharge status: Home / Self Care | Payer: Medicaid Other | Attending: Emergency Medicine | Admitting: Emergency Medicine

## 2013-05-26 DIAGNOSIS — Z8742 Personal history of other diseases of the female genital tract: Secondary | ICD-10-CM | POA: Insufficient documentation

## 2013-05-26 DIAGNOSIS — Z79899 Other long term (current) drug therapy: Secondary | ICD-10-CM | POA: Insufficient documentation

## 2013-05-26 DIAGNOSIS — G43909 Migraine, unspecified, not intractable, without status migrainosus: Secondary | ICD-10-CM | POA: Insufficient documentation

## 2013-05-26 DIAGNOSIS — R42 Dizziness and giddiness: Secondary | ICD-10-CM

## 2013-05-26 DIAGNOSIS — Z3202 Encounter for pregnancy test, result negative: Secondary | ICD-10-CM | POA: Insufficient documentation

## 2013-05-26 DIAGNOSIS — R110 Nausea: Secondary | ICD-10-CM | POA: Insufficient documentation

## 2013-05-26 DIAGNOSIS — J45909 Unspecified asthma, uncomplicated: Secondary | ICD-10-CM | POA: Insufficient documentation

## 2013-05-26 DIAGNOSIS — F17200 Nicotine dependence, unspecified, uncomplicated: Secondary | ICD-10-CM | POA: Insufficient documentation

## 2013-05-26 DIAGNOSIS — F329 Major depressive disorder, single episode, unspecified: Secondary | ICD-10-CM | POA: Insufficient documentation

## 2013-05-26 DIAGNOSIS — F3289 Other specified depressive episodes: Secondary | ICD-10-CM | POA: Insufficient documentation

## 2013-05-26 LAB — COMPREHENSIVE METABOLIC PANEL
ALT: 20 U/L (ref 0–35)
AST: 17 U/L (ref 0–37)
Albumin: 3.6 g/dL (ref 3.5–5.2)
Alkaline Phosphatase: 90 U/L (ref 39–117)
BUN: 5 mg/dL — ABNORMAL LOW (ref 6–23)
CO2: 24 mEq/L (ref 19–32)
Calcium: 9.5 mg/dL (ref 8.4–10.5)
Chloride: 104 mEq/L (ref 96–112)
Creatinine, Ser: 0.58 mg/dL (ref 0.50–1.10)
GFR calc Af Amer: >90 mL/min (ref >90)
GFR calc non Af Amer: >90 mL/min (ref >90)
Glucose, Bld: 94 mg/dL (ref 70–99)
Potassium: 3.2 mEq/L — ABNORMAL LOW (ref 3.5–5.1)
Sodium: 137 mEq/L (ref 135–145)
Total Bilirubin: 0.2 mg/dL — ABNORMAL LOW (ref 0.3–1.2)
Total Protein: 7.8 g/dL (ref 6.0–8.3)

## 2013-05-26 LAB — CBC WITH DIFFERENTIAL/PLATELET
Basophils Absolute: 0.1 10*3/uL (ref 0.0–0.1)
Basophils Relative: 0 % (ref 0–1)
Eosinophils Absolute: 0.4 10*3/uL (ref 0.0–0.7)
Eosinophils Relative: 4 % (ref 0–5)
HCT: 42.2 % (ref 36.0–46.0)
Hemoglobin: 14.5 g/dL (ref 12.0–15.0)
Lymphocytes Relative: 23 % (ref 12–46)
Lymphs Abs: 2.6 10*3/uL (ref 0.7–4.0)
MCH: 27.6 pg (ref 26.0–34.0)
MCHC: 34.4 g/dL (ref 30.0–36.0)
MCV: 80.4 fL (ref 78.0–100.0)
Monocytes Absolute: 0.5 10*3/uL (ref 0.1–1.0)
Monocytes Relative: 5 % (ref 3–12)
Neutro Abs: 8 10*3/uL — ABNORMAL HIGH (ref 1.7–7.7)
Neutrophils Relative %: 69 % (ref 43–77)
Platelets: 340 10*3/uL (ref 150–400)
RBC: 5.25 MIL/uL — ABNORMAL HIGH (ref 3.87–5.11)
RDW: 14.3 % (ref 11.5–15.5)
WBC: 11.7 10*3/uL — ABNORMAL HIGH (ref 4.0–10.5)

## 2013-05-26 LAB — URINALYSIS, ROUTINE W REFLEX MICROSCOPIC
Bilirubin Urine: NEGATIVE
Glucose, UA: 250 mg/dL — AB
Hgb urine dipstick: NEGATIVE
Ketones, ur: NEGATIVE mg/dL
Leukocytes, UA: NEGATIVE
Nitrite: NEGATIVE
Protein, ur: NEGATIVE mg/dL
Specific Gravity, Urine: 1.027 (ref 1.005–1.030)
Urobilinogen, UA: 1 mg/dL (ref 0.0–1.0)
pH: 7 (ref 5.0–8.0)

## 2013-05-26 LAB — POCT PREGNANCY, URINE: Preg Test, Ur: NEGATIVE

## 2013-05-26 MED ORDER — CLONAZEPAM 0.5 MG PO TABS: 0.2500 mg | ORAL_TABLET | Freq: Two times a day (BID) | ORAL | Status: DC | PRN

## 2013-05-26 MED ORDER — SODIUM CHLORIDE 0.9 % IV BOLUS (SEPSIS)
1000.0000 mL | Freq: Once | INTRAVENOUS | Status: AC
  Administered 2013-05-26: 1000 mL via INTRAVENOUS

## 2013-05-26 MED ORDER — PROMETHAZINE HCL 25 MG/ML IJ SOLN
25.0000 mg | Freq: Once | INTRAMUSCULAR | Status: AC
  Administered 2013-05-26: 25 mg via INTRAVENOUS
  Filled 2013-05-26: qty 1

## 2013-05-26 NOTE — ED Notes (Signed)
Went into d/c the patient. The patient not wanting to get off the phone for the discharge instructions. Patient also reports that she is already on the meds that she is be given to go home with and does not want the rx. MD aware and rx shreadded.

## 2013-05-26 NOTE — ED Notes (Signed)
She states she had "a panic attack today and I took some Klonipin--now it's something different--I feel out-of-it like I'm gonna pass out".  She is drowsy in appearance, with her skin being normal, warm and dry and she is breathing normally.

## 2013-05-26 NOTE — ED Notes (Signed)
Pt here via [facility] c/o anxiety. Pt has of the same. Pt husband called EMS because pt "wasn't acting normally." Pt A&O and in NAD.

## 2013-05-26 NOTE — ED Provider Notes (Signed)
History    CSN: [id number] Arrival date & time 05/26/13  1642  First MD Initiated Contact with Patient 05/26/13 1716     Chief Complaint  Patient presents with  . Anxiety   (Consider location/radiation/quality/duration/timing/severity/associated sxs/prior Treatment) HPI  Patient presents with concern of lightheadedness, nausea. She states that since this began approximately 3 hours ago.  Prior to that the patient had anxiety, but was generally in her usual state of health.  For her anxiety she was recently prescribed clonazepam.  She took one tablet.  Soon thereafter she felt the onset of symptoms. She denies focal pain anywhere, complete syncope, vomiting, diarrhea, incontinence. However, her symptoms have been progressive since onset.  No clear alleviating factors. Patient states that this is the third dose she has taken his medication.  None of the prior doses has had a fairly benign courses either.     Past Medical History  Diagnosis Date  . Preterm labor     c/s at 36 wks  . Placenta previa     hx and with current pregnancy  . Migraines     last one over 1 month ago  . Anxiety     no meds  . Depression     no meds  . Asthma    Past Surgical History  Procedure Laterality Date  . Cesarean section  2010    x 1 at 36 wks in [place]  . Svd  2003    x 1 in [place]  . Wisdom tooth extraction    . Tubal ligation     Family History  Problem Relation Age of Onset  . Cancer Mother   . Hypertension Sister   . Sickle cell trait Daughter   . Asthma Son   . Asthma Paternal Grandmother   . Pseudochol deficiency Neg Hx   . Malignant hyperthermia Neg Hx   . Anesthesia problems Neg Hx   . Hypotension Neg Hx    History  Substance Use Topics  . Smoking status: Current Every Day Smoker -- 0.20 packs/day for 12 years    Types: Cigarettes  . Smokeless tobacco: Never Used  . Alcohol Use: No     Comment: PReviously drank on occ   OB History   Grav Para Term Preterm  Abortions TAB SAB Ect Mult Living   3 3 1 2  0 0 0 0 0 3     Review of Systems  Constitutional:       Per HPI, otherwise negative  HENT:       Per HPI, otherwise negative  Respiratory:       Per HPI, otherwise negative  Cardiovascular:       Per HPI, otherwise negative  Gastrointestinal: Positive for nausea. Negative for vomiting.  Endocrine:       Negative aside from HPI  Genitourinary:       Neg aside from HPI   Musculoskeletal:       Per HPI, otherwise negative  Skin: Negative.   Neurological: Positive for weakness and light-headedness. Negative for syncope.    Allergies  Review of patient's allergies indicates no known allergies.  Home Medications   Current Outpatient Rx  Name  Route  Sig  Dispense  Refill  . atomoxetine (STRATTERA) 10 MG capsule   Oral   Take 10 mg by mouth daily.         . clonazePAM (KLONOPIN) 0.5 MG tablet   Oral   Take 0.5 mg by mouth 2 (two) times  daily as needed for anxiety.         . cyclobenzaprine (FLEXERIL) 5 MG tablet   Oral   Take 5 mg by mouth 3 (three) times daily as needed for muscle spasms.         . hydrOXYzine (ATARAX/VISTARIL) 50 MG tablet   Oral   Take 50 mg by mouth 3 (three) times daily as needed for itching.         . mirtazapine (REMERON) 15 MG tablet   Oral   Take 15 mg by mouth at bedtime.         . promethazine (PHENERGAN) 25 MG tablet   Oral   Take 25 mg by mouth every 6 (six) hours as needed for nausea.          Marland Kitchen levonorgestrel (MIRENA) 20 MCG/24HR IUD   Intrauterine   1 each by Intrauterine route once.          SpO2 97%  LMP 04/28/2013 Physical Exam  Nursing note and vitals reviewed. Constitutional: She is oriented to person, place, and time. She appears well-developed and well-nourished. No distress.  HENT:  Head: Normocephalic and atraumatic.  Eyes: Conjunctivae and EOM are normal.  Pulmonary/Chest: Effort normal. No stridor. No respiratory distress.  Abdominal: She exhibits no  distension.  Musculoskeletal: She exhibits no edema.  Neurological: She is alert and oriented to person, place, and time. No cranial nerve deficit.  Skin: Skin is warm and dry.  Psychiatric: Her mood appears anxious. Her speech is delayed. She is withdrawn. She expresses no homicidal and no suicidal ideation. She expresses no suicidal plans and no homicidal plans.    ED Course  Procedures (including critical care time) Labs Reviewed  CBC WITH DIFFERENTIAL  COMPREHENSIVE METABOLIC PANEL  URINALYSIS, ROUTINE W REFLEX MICROSCOPIC   No results found. No diagnosis found. Pulse ox 99% room air normal   Date: 05/26/2013  Rate: 96  Rhythm: normal sinus rhythm  QRS Axis: normal  Intervals: normal  ST/T Wave abnormalities: normal  Conduction Disutrbances: none  Narrative Interpretation: unremarkable  Update: Patient stable on repeat exam, vitals remained stable.  She was made aware of all results.  She requests food.   MDM  The patient presents after an episode of profound anxiety with mild lightheadedness.  Given the patient's description of recent initiation of a new anxiolytic, there is some suspicion for drug reaction. On exam the patient is awake and alert, neurologically intact, in no distress.  Vital signs do not suggest occult infection or other acute pathology. Patient's medications were adjusted, and she was discharged in stable condition to follow up with her primary care physician.  Gerhard Munch, MD 05/26/13 2000

## 2013-07-20 ENCOUNTER — Encounter (HOSPITAL_COMMUNITY): Payer: Self-pay | Admitting: Emergency Medicine

## 2013-07-20 ENCOUNTER — Emergency Department (HOSPITAL_COMMUNITY)
Admission: EM | Admit: 2013-07-20 | Discharge: 2013-07-20 | Disposition: A | Discharge status: Home / Self Care | Payer: Medicaid Other | Attending: Emergency Medicine | Admitting: Emergency Medicine

## 2013-07-20 DIAGNOSIS — F17200 Nicotine dependence, unspecified, uncomplicated: Secondary | ICD-10-CM | POA: Insufficient documentation

## 2013-07-20 DIAGNOSIS — Z79899 Other long term (current) drug therapy: Secondary | ICD-10-CM | POA: Insufficient documentation

## 2013-07-20 DIAGNOSIS — F329 Major depressive disorder, single episode, unspecified: Secondary | ICD-10-CM | POA: Insufficient documentation

## 2013-07-20 DIAGNOSIS — K219 Gastro-esophageal reflux disease without esophagitis: Secondary | ICD-10-CM | POA: Insufficient documentation

## 2013-07-20 DIAGNOSIS — J029 Acute pharyngitis, unspecified: Secondary | ICD-10-CM | POA: Insufficient documentation

## 2013-07-20 DIAGNOSIS — B9789 Other viral agents as the cause of diseases classified elsewhere: Secondary | ICD-10-CM | POA: Insufficient documentation

## 2013-07-20 DIAGNOSIS — B349 Viral infection, unspecified: Secondary | ICD-10-CM

## 2013-07-20 DIAGNOSIS — Z8751 Personal history of pre-term labor: Secondary | ICD-10-CM | POA: Insufficient documentation

## 2013-07-20 DIAGNOSIS — J45909 Unspecified asthma, uncomplicated: Secondary | ICD-10-CM | POA: Insufficient documentation

## 2013-07-20 DIAGNOSIS — F3289 Other specified depressive episodes: Secondary | ICD-10-CM | POA: Insufficient documentation

## 2013-07-20 DIAGNOSIS — J069 Acute upper respiratory infection, unspecified: Secondary | ICD-10-CM

## 2013-07-20 DIAGNOSIS — Z8679 Personal history of other diseases of the circulatory system: Secondary | ICD-10-CM | POA: Insufficient documentation

## 2013-07-20 MED ORDER — PROMETHAZINE HCL 25 MG PO TABS
25.0000 mg | ORAL_TABLET | Freq: Once | ORAL | Status: AC
  Administered 2013-07-20: 25 mg via ORAL
  Filled 2013-07-20: qty 1

## 2013-07-20 MED ORDER — DEXAMETHASONE 6 MG PO TABS
6.0000 mg | ORAL_TABLET | Freq: Once | ORAL | Status: AC
  Administered 2013-07-20: 6 mg via ORAL
  Filled 2013-07-20: qty 1

## 2013-07-20 MED ORDER — PROMETHAZINE HCL 25 MG PO TABS: 25.0000 mg | ORAL_TABLET | Freq: Four times a day (QID) | ORAL | Status: DC | PRN

## 2013-07-20 MED ORDER — LIDOCAINE VISCOUS 2 % MT SOLN
15.0000 mL | Freq: Once | OROMUCOSAL | Status: AC
  Administered 2013-07-20: 15 mL via OROMUCOSAL
  Filled 2013-07-20: qty 15

## 2013-07-20 NOTE — ED Provider Notes (Signed)
CSN: [id number]     Arrival date & time 07/20/13  0125 History   First MD Initiated Contact with Patient 07/20/13 0136     Chief Complaint  Patient presents with  . Gastrophageal Reflux   (Consider location/radiation/quality/duration/timing/severity/associated sxs/prior Treatment) Patient is a 32 y.o. female presenting with URI. The history is provided by the patient. No language interpreter was used.  URI Presenting symptoms: congestion, cough, rhinorrhea and sore throat   Presenting symptoms: no ear pain, no facial pain, no fatigue and no fever   Congestion:    Location:  Nasal   Interferes with sleep: no     Interferes with eating/drinking: no   Cough:    Cough characteristics:  Productive   Sputum characteristics:  Nondescript   Severity:  Moderate   Onset quality:  Gradual   Duration:  2 days   Timing:  Constant   Progression:  Unchanged   Chronicity:  New Rhinorrhea:    Quality:  Clear Sore throat:    Severity:  Severe   Onset quality:  Gradual   Duration:  2 days   Timing:  Constant   Progression:  Worsening Severity:  Moderate Onset quality:  Gradual Duration:  2 days Timing:  Constant Progression:  Unchanged Chronicity:  New Relieved by:  Nothing Worsened by:  Nothing tried Ineffective treatments:  OTC medications and nebulizer treatments Associated symptoms: myalgias, sinus pain and sneezing   Associated symptoms: no arthralgias, no headaches, no neck pain, no swollen glands and no wheezing     Past Medical History  Diagnosis Date  . Preterm labor     c/s at 36 wks  . Placenta previa     hx and with current pregnancy  . Migraines     last one over 1 month ago  . Anxiety     no meds  . Depression     no meds  . Asthma    Past Surgical History  Procedure Laterality Date  . Cesarean section  2010    x 1 at 36 wks in [place]  . Svd  2003    x 1 in [place]  . Wisdom tooth extraction    . Tubal ligation     Family History  Problem Relation Age of  Onset  . Cancer Mother   . Hypertension Sister   . Sickle cell trait Daughter   . Asthma Son   . Asthma Paternal Grandmother   . Pseudochol deficiency Neg Hx   . Malignant hyperthermia Neg Hx   . Anesthesia problems Neg Hx   . Hypotension Neg Hx    History  Substance Use Topics  . Smoking status: Current Every Day Smoker -- 0.20 packs/day for 12 years    Types: Cigarettes  . Smokeless tobacco: Never Used  . Alcohol Use: No     Comment: PReviously drank on occ   OB History   Grav Para Term Preterm Abortions TAB SAB Ect Mult Living   3 3 1 2  0 0 0 0 0 3     Review of Systems  Constitutional: Negative for fever, chills, diaphoresis, activity change, appetite change and fatigue.  HENT: Positive for congestion, sore throat, rhinorrhea and sneezing. Negative for ear pain, facial swelling, neck pain and neck stiffness.   Eyes: Negative for photophobia and discharge.  Respiratory: Positive for cough. Negative for chest tightness, shortness of breath and wheezing.   Cardiovascular: Negative for chest pain, palpitations and leg swelling.  Gastrointestinal: Negative for nausea, vomiting, abdominal  pain and diarrhea.  Endocrine: Negative for polydipsia and polyuria.  Genitourinary: Negative for dysuria, frequency, difficulty urinating and pelvic pain.  Musculoskeletal: Positive for myalgias. Negative for back pain and arthralgias.  Skin: Negative for color change and wound.  Allergic/Immunologic: Negative for immunocompromised state.  Neurological: Negative for facial asymmetry, weakness, numbness and headaches.  Hematological: Does not bruise/bleed easily.  Psychiatric/Behavioral: Negative for confusion and agitation.    Allergies  Review of patient's allergies indicates no known allergies.  Home Medications   Current Outpatient Rx  Name  Route  Sig  Dispense  Refill  . albuterol (PROVENTIL) (2.5 MG/3ML) 0.083% nebulizer solution   Nebulization   Take 2.5 mg by nebulization  every 6 (six) hours as needed for wheezing.         Marland Kitchen atomoxetine (STRATTERA) 10 MG capsule   Oral   Take 10 mg by mouth every morning.          . cyclobenzaprine (FLEXERIL) 5 MG tablet   Oral   Take 5 mg by mouth 3 (three) times daily as needed for muscle spasms.         Marland Kitchen guaifenesin (ROBITUSSIN) 100 MG/5ML syrup   Oral   Take 200 mg by mouth 3 (three) times daily as needed for cough.         . hydrOXYzine (ATARAX/VISTARIL) 50 MG tablet   Oral   Take 50 mg by mouth 3 (three) times daily as needed for itching.         Marland Kitchen levonorgestrel (MIRENA) 20 MCG/24HR IUD   Intrauterine   1 each by Intrauterine route once.         . mirtazapine (REMERON) 15 MG tablet   Oral   Take 15 mg by mouth at bedtime.         . promethazine (PHENERGAN) 25 MG tablet   Oral   Take 25 mg by mouth every 6 (six) hours as needed for nausea.          . Pseudoephedrine-APAP-DM (DAYQUIL PO)   Oral   Take 1 capsule by mouth 2 (two) times daily as needed (cough).         . promethazine (PHENERGAN) 25 MG tablet   Oral   Take 1 tablet (25 mg total) by mouth every 6 (six) hours as needed for nausea.   15 tablet   0    BP 134/91  Pulse 98  Temp(Src) 98.7 F (37.1 C) (Oral)  Resp 16  SpO2 100% Physical Exam  Constitutional: She is oriented to person, place, and time. She appears well-developed and well-nourished. No distress.  HENT:  Head: Normocephalic and atraumatic.  Mouth/Throat: Mucous membranes are normal. Posterior oropharyngeal erythema present. No oropharyngeal exudate, posterior oropharyngeal edema or tonsillar abscesses.  Eyes: Pupils are equal, round, and reactive to light.  Neck: Normal range of motion. Neck supple.  Cardiovascular: Normal rate, regular rhythm and normal heart sounds.  Exam reveals no gallop and no friction rub.   No murmur heard. Pulmonary/Chest: Effort normal and breath sounds normal. No respiratory distress. She has no wheezes. She has no rales.   Abdominal: Soft. Bowel sounds are normal. She exhibits no distension and no mass. There is no tenderness. There is no rebound and no guarding.  Musculoskeletal: Normal range of motion. She exhibits no edema and no tenderness.  Neurological: She is alert and oriented to person, place, and time.  Skin: Skin is warm and dry.  Psychiatric: She has a normal mood and affect.  ED Course  Procedures (including critical care time) Labs Review Labs Reviewed - No data to display Imaging Review No results found.  MDM   1. Viral URI   2. Pharyngitis with viral syndrome    Pt is a 32 y.o. female with Pmhx as above who presents with several days of cough, congestion, rhinorrhea, post-nasal gtt, sneezing, sore throat.  Pt had episode of emesis just PTA and now sore throat worse.  On exam, VSS, pt in NAD.  Cardiopulm exam benign.  Posterior oropharynx erythematous, but no tonsillar exudates, no cervical lymphadenopathy, unremarkable abdominal exam.  I believe pt has acute viral URI/viral pharyngitis, and based on exam doubt pna, strep pharyngitis, acute asthma exacerbation.  She can continue suportive care at home w/ fluids, NSAIDs/tylenol for pain OTC throat drops/spray.  Here, have given liquid lidocaine, 1 dose PO decadron & phenergan. Will give. rx for phenergan for nausea.  Return precautions given for new or worsening symptoms including trouble breathing, inability to tolerate PO.   1. Viral URI   2. Pharyngitis with viral syndrome         Shanna Cisco, MD 07/20/13 [phone]

## 2013-07-20 NOTE — ED Notes (Signed)
Pt states that she has taken cough syrup, robitussin, and taken a breathing treatment at home prior to calling EMS

## 2013-07-20 NOTE — ED Notes (Signed)
Per EMS report: 30 minutes prior to calling EMS, pt reports a burning sensation that began in her throat and traveled down to her epigastric area.  Prior to the burning sensation, pt reports burping all night long.  Pt reports eating a "Big Mac" for dinner but this is not a change is her normal diet habits. Pt denies SOB, N/V, or any cardiac system. EMS noted that enroute, pt began to gag and had 1 episode of emesis. Pt still denies nausea but reports a feeling in her throat that she wants to get out. After her episode of emesis, pt reports the burning sensation became more severe.  Pt observed ambulating in the hallway with no difficulty.  EMS VS: BP: 140/90, HR: 100, RR: 18

## 2013-07-20 NOTE — ED Notes (Signed)
Bed: [facility] Expected date:  Expected time:  Means of arrival:  Comments: 212 10F belching x 30 min

## 2013-09-23 ENCOUNTER — Emergency Department (HOSPITAL_COMMUNITY): Payer: Medicaid Other

## 2013-09-23 ENCOUNTER — Emergency Department (HOSPITAL_COMMUNITY)
Admission: EM | Admit: 2013-09-23 | Discharge: 2013-09-23 | Disposition: A | Discharge status: Home / Self Care | Payer: Medicaid Other | Attending: Emergency Medicine | Admitting: Emergency Medicine

## 2013-09-23 ENCOUNTER — Encounter (HOSPITAL_COMMUNITY): Payer: Self-pay | Admitting: Emergency Medicine

## 2013-09-23 DIAGNOSIS — N12 Tubulo-interstitial nephritis, not specified as acute or chronic: Secondary | ICD-10-CM | POA: Insufficient documentation

## 2013-09-23 DIAGNOSIS — M549 Dorsalgia, unspecified: Secondary | ICD-10-CM

## 2013-09-23 DIAGNOSIS — F411 Generalized anxiety disorder: Secondary | ICD-10-CM | POA: Insufficient documentation

## 2013-09-23 DIAGNOSIS — IMO0001 Reserved for inherently not codable concepts without codable children: Secondary | ICD-10-CM | POA: Insufficient documentation

## 2013-09-23 DIAGNOSIS — R51 Headache: Secondary | ICD-10-CM | POA: Insufficient documentation

## 2013-09-23 DIAGNOSIS — F329 Major depressive disorder, single episode, unspecified: Secondary | ICD-10-CM | POA: Insufficient documentation

## 2013-09-23 DIAGNOSIS — J45909 Unspecified asthma, uncomplicated: Secondary | ICD-10-CM | POA: Insufficient documentation

## 2013-09-23 DIAGNOSIS — M542 Cervicalgia: Secondary | ICD-10-CM | POA: Insufficient documentation

## 2013-09-23 DIAGNOSIS — Z79899 Other long term (current) drug therapy: Secondary | ICD-10-CM | POA: Insufficient documentation

## 2013-09-23 DIAGNOSIS — N390 Urinary tract infection, site not specified: Secondary | ICD-10-CM | POA: Insufficient documentation

## 2013-09-23 DIAGNOSIS — Z3202 Encounter for pregnancy test, result negative: Secondary | ICD-10-CM | POA: Insufficient documentation

## 2013-09-23 DIAGNOSIS — M546 Pain in thoracic spine: Secondary | ICD-10-CM | POA: Insufficient documentation

## 2013-09-23 DIAGNOSIS — F3289 Other specified depressive episodes: Secondary | ICD-10-CM | POA: Insufficient documentation

## 2013-09-23 DIAGNOSIS — F17200 Nicotine dependence, unspecified, uncomplicated: Secondary | ICD-10-CM | POA: Insufficient documentation

## 2013-09-23 LAB — URINE MICROSCOPIC-ADD ON

## 2013-09-23 LAB — BASIC METABOLIC PANEL
BUN: 8 mg/dL (ref 6–23)
CO2: 24 mEq/L (ref 19–32)
Calcium: 9 mg/dL (ref 8.4–10.5)
Chloride: 106 mEq/L (ref 96–112)
Creatinine, Ser: 0.58 mg/dL (ref 0.50–1.10)
GFR calc Af Amer: >90 mL/min (ref >90)
GFR calc non Af Amer: >90 mL/min (ref >90)
Glucose, Bld: 105 mg/dL — ABNORMAL HIGH (ref 70–99)
Potassium: 3.6 mEq/L (ref 3.5–5.1)
Sodium: 139 mEq/L (ref 135–145)

## 2013-09-23 LAB — HEPATIC FUNCTION PANEL
ALT: 12 U/L (ref 0–35)
AST: 17 U/L (ref 0–37)
Albumin: 3.5 g/dL (ref 3.5–5.2)
Alkaline Phosphatase: 73 U/L (ref 39–117)
Bilirubin, Direct: 0.1 mg/dL (ref 0.0–0.3)
Indirect Bilirubin: 0.6 mg/dL (ref 0.3–0.9)
Total Bilirubin: 0.7 mg/dL (ref 0.3–1.2)
Total Protein: 6.9 g/dL (ref 6.0–8.3)

## 2013-09-23 LAB — URINALYSIS, ROUTINE W REFLEX MICROSCOPIC
Bilirubin Urine: NEGATIVE
Glucose, UA: NEGATIVE mg/dL
Ketones, ur: NEGATIVE mg/dL
Nitrite: NEGATIVE
Protein, ur: NEGATIVE mg/dL
Specific Gravity, Urine: 1.029 (ref 1.005–1.030)
Urobilinogen, UA: 1 mg/dL (ref 0.0–1.0)
pH: 6 (ref 5.0–8.0)

## 2013-09-23 LAB — WET PREP, GENITAL
Clue Cells Wet Prep HPF POC: NONE SEEN
Trich, Wet Prep: NONE SEEN
WBC, Wet Prep HPF POC: NONE SEEN
Yeast Wet Prep HPF POC: NONE SEEN

## 2013-09-23 LAB — CBC
HCT: 40 % (ref 36.0–46.0)
Hemoglobin: 13.7 g/dL (ref 12.0–15.0)
MCH: 28.1 pg (ref 26.0–34.0)
MCHC: 34.3 g/dL (ref 30.0–36.0)
MCV: 82.1 fL (ref 78.0–100.0)
Platelets: 345 10*3/uL (ref 150–400)
RBC: 4.87 MIL/uL (ref 3.87–5.11)
RDW: 13.9 % (ref 11.5–15.5)
WBC: 8.5 10*3/uL (ref 4.0–10.5)

## 2013-09-23 LAB — POCT PREGNANCY, URINE: Preg Test, Ur: NEGATIVE

## 2013-09-23 MED ORDER — ONDANSETRON HCL 4 MG/2ML IJ SOLN
4.0000 mg | Freq: Once | INTRAMUSCULAR | Status: AC
  Administered 2013-09-23: 4 mg via INTRAVENOUS
  Filled 2013-09-23: qty 2

## 2013-09-23 MED ORDER — KETOROLAC TROMETHAMINE 30 MG/ML IJ SOLN
30.0000 mg | Freq: Once | INTRAMUSCULAR | Status: AC
  Administered 2013-09-23: 30 mg via INTRAVENOUS
  Filled 2013-09-23: qty 1

## 2013-09-23 MED ORDER — CYCLOBENZAPRINE HCL 10 MG PO TABS
10.0000 mg | ORAL_TABLET | Freq: Once | ORAL | Status: AC
  Administered 2013-09-23: 10 mg via ORAL
  Filled 2013-09-23: qty 1

## 2013-09-23 MED ORDER — CIPROFLOXACIN HCL 500 MG PO TABS: 500.0000 mg | ORAL_TABLET | Freq: Two times a day (BID) | ORAL | Status: DC

## 2013-09-23 MED ORDER — DEXTROSE 5 % IV SOLN
1.0000 g | Freq: Once | INTRAVENOUS | Status: AC
  Administered 2013-09-23: 1 g via INTRAVENOUS
  Filled 2013-09-23: qty 10

## 2013-09-23 MED ORDER — SODIUM CHLORIDE 0.9 % IV BOLUS (SEPSIS)
1000.0000 mL | Freq: Once | INTRAVENOUS | Status: AC
  Administered 2013-09-23: 1000 mL via INTRAVENOUS

## 2013-09-23 MED ORDER — IOHEXOL 300 MG/ML  SOLN
100.0000 mL | Freq: Once | INTRAMUSCULAR | Status: AC | PRN
  Administered 2013-09-23: 100 mL via INTRAVENOUS

## 2013-09-23 NOTE — ED Notes (Signed)
Pt states that last week she started haivng mid-upper back pain that radiated down bilat legs, up her neck to head.  Then today pt states that she has RLQ pain that is really bad when she moves/walks.  Pt denies any fevers, lifting moving anything heavy or any injury of any kind.   Pt requesting a note for work bc she has to be there at 4pm today.

## 2013-09-23 NOTE — ED Provider Notes (Signed)
CSN: [id number]     Arrival date & time 09/23/13  1146 History   First MD Initiated Contact with Patient 09/23/13 1206     Chief Complaint  Patient presents with  . Abdominal Pain  . Back Pain  . Leg Pain  . Neck Pain  . Headache   (Consider location/radiation/quality/duration/timing/severity/associated sxs/prior Treatment) Patient is a 32 y.o. female presenting with back pain, leg pain, neck pain, and headaches. The history is provided by the patient.  Back Pain Location:  Thoracic spine Quality:  Aching Radiates to: bilateral legs, upper back, neck, head. Pain severity:  Moderate Pain is:  Same all the time Onset quality:  Gradual Duration:  1 week Timing:  Constant Progression:  Worsening Context: not recent illness and not recent injury   Associated symptoms: headaches and leg pain   Leg Pain Associated symptoms: back pain and neck pain   Neck Pain Associated symptoms: headaches and leg pain   Headache Associated symptoms: back pain and neck pain     Past Medical History  Diagnosis Date  . Preterm labor     c/s at 36 wks  . Placenta previa     hx and with current pregnancy  . Migraines     last one over 1 month ago  . Anxiety     no meds  . Depression     no meds  . Asthma    Past Surgical History  Procedure Laterality Date  . Cesarean section  2010    x 1 at 36 wks in [place]  . Svd  2003    x 1 in [place]  . Wisdom tooth extraction    . Tubal ligation     Family History  Problem Relation Age of Onset  . Cancer Mother   . Hypertension Sister   . Sickle cell trait Daughter   . Asthma Son   . Asthma Paternal Grandmother   . Pseudochol deficiency Neg Hx   . Malignant hyperthermia Neg Hx   . Anesthesia problems Neg Hx   . Hypotension Neg Hx    History  Substance Use Topics  . Smoking status: Current Every Day Smoker -- 0.20 packs/day for 12 years    Types: Cigarettes  . Smokeless tobacco: Never Used  . Alcohol Use: No     Comment: PReviously  drank on occ   OB History   Grav Para Term Preterm Abortions TAB SAB Ect Mult Living   3 3 1 2  0 0 0 0 0 3     Review of Systems  Musculoskeletal: Positive for back pain and neck pain.  Neurological: Positive for headaches.  All other systems reviewed and are negative.    Allergies  Review of patient's allergies indicates no known allergies.  Home Medications   Current Outpatient Rx  Name  Route  Sig  Dispense  Refill  . atomoxetine (STRATTERA) 40 MG capsule   Oral   Take 80 mg by mouth daily.         Marland Kitchen LORazepam (ATIVAN) 0.5 MG tablet   Oral   Take 1 mg by mouth every 8 (eight) hours.         . promethazine (PHENERGAN) 25 MG tablet   Oral   Take 25 mg by mouth every 6 (six) hours as needed for nausea.          Marland Kitchen albuterol (PROVENTIL) (2.5 MG/3ML) 0.083% nebulizer solution   Nebulization   Take 2.5 mg by nebulization every 6 (six)  hours as needed for wheezing.         Marland Kitchen levonorgestrel (MIRENA) 20 MCG/24HR IUD   Intrauterine   1 each by Intrauterine route once.          BP 123/90  Pulse 88  Temp(Src) 97.7 F (36.5 C) (Oral)  Resp 17  SpO2 96% Physical Exam  Nursing note and vitals reviewed. Constitutional: She is oriented to person, place, and time. She appears well-developed and well-nourished. No distress.  HENT:  Head: Normocephalic and atraumatic.  Eyes: EOM are normal. Pupils are equal, round, and reactive to light.  Neck: Normal range of motion. Neck supple.  Cardiovascular: Normal rate and regular rhythm.  Exam reveals no friction rub.   No murmur heard. Pulmonary/Chest: Effort normal and breath sounds normal. No respiratory distress. She has no wheezes. She has no rales.  Abdominal: Soft. She exhibits no distension. There is tenderness (L flank) in the right lower quadrant, suprapubic area and left lower quadrant. There is no rebound.  Musculoskeletal: Normal range of motion. She exhibits no edema.       Thoracic back: She exhibits  tenderness (L mid back, L flank).  Neurological: She is alert and oriented to person, place, and time.  Skin: She is not diaphoretic.    ED Course  Procedures (including critical care time) Labs Review Labs Reviewed  GC/CHLAMYDIA PROBE AMP  WET PREP, GENITAL  CBC  BASIC METABOLIC PANEL  URINALYSIS, ROUTINE W REFLEX MICROSCOPIC  HEPATIC FUNCTION PANEL   Imaging Review No results found.  EKG Interpretation   None       MDM   1. Pyelonephritis   2. UTI (urinary tract infection)   3. Back pain    41F presents with back pain. Persistent for past week, radiates up and down spine. No fevers, N/V/D, dysuria. Patient denies trauma, falls. Patient also has some associated abdominal pain.  On exam, AFVSS. L CVA tenderness, L central back pain. Suprapubic pain without focal RLQ pain.  Labs show UTI. With UTI and CVA tenderness, concern for early pyelo. CT negative. No concerns for appendicitis. Given Rocephin, will discharge with Cipro and PCP f/u.     Dagmar Hait, MD 09/23/13 [phone]

## 2013-09-24 LAB — GC/CHLAMYDIA PROBE AMP
CT Probe RNA: NEGATIVE
GC Probe RNA: NEGATIVE

## 2013-09-26 ENCOUNTER — Encounter (HOSPITAL_COMMUNITY): Payer: Self-pay | Admitting: Emergency Medicine

## 2013-09-26 ENCOUNTER — Emergency Department (HOSPITAL_COMMUNITY)
Admission: EM | Admit: 2013-09-26 | Discharge: 2013-09-26 | Disposition: A | Discharge status: Home / Self Care | Payer: Medicaid Other | Attending: Emergency Medicine | Admitting: Emergency Medicine

## 2013-09-26 DIAGNOSIS — Z8679 Personal history of other diseases of the circulatory system: Secondary | ICD-10-CM | POA: Insufficient documentation

## 2013-09-26 DIAGNOSIS — Z792 Long term (current) use of antibiotics: Secondary | ICD-10-CM | POA: Insufficient documentation

## 2013-09-26 DIAGNOSIS — N390 Urinary tract infection, site not specified: Secondary | ICD-10-CM

## 2013-09-26 DIAGNOSIS — Z975 Presence of (intrauterine) contraceptive device: Secondary | ICD-10-CM | POA: Insufficient documentation

## 2013-09-26 DIAGNOSIS — F329 Major depressive disorder, single episode, unspecified: Secondary | ICD-10-CM | POA: Insufficient documentation

## 2013-09-26 DIAGNOSIS — Z8742 Personal history of other diseases of the female genital tract: Secondary | ICD-10-CM | POA: Insufficient documentation

## 2013-09-26 DIAGNOSIS — J45909 Unspecified asthma, uncomplicated: Secondary | ICD-10-CM | POA: Insufficient documentation

## 2013-09-26 DIAGNOSIS — Z79899 Other long term (current) drug therapy: Secondary | ICD-10-CM | POA: Insufficient documentation

## 2013-09-26 DIAGNOSIS — F411 Generalized anxiety disorder: Secondary | ICD-10-CM | POA: Insufficient documentation

## 2013-09-26 DIAGNOSIS — F3289 Other specified depressive episodes: Secondary | ICD-10-CM | POA: Insufficient documentation

## 2013-09-26 DIAGNOSIS — Z8751 Personal history of pre-term labor: Secondary | ICD-10-CM | POA: Insufficient documentation

## 2013-09-26 DIAGNOSIS — F17200 Nicotine dependence, unspecified, uncomplicated: Secondary | ICD-10-CM | POA: Insufficient documentation

## 2013-09-26 MED ORDER — DEXTROSE 5 % IV SOLN
1.0000 g | Freq: Once | INTRAVENOUS | Status: AC
  Administered 2013-09-26: 1 g via INTRAVENOUS
  Filled 2013-09-26: qty 10

## 2013-09-26 MED ORDER — CIPROFLOXACIN HCL 500 MG PO TABS: 500.0000 mg | ORAL_TABLET | Freq: Two times a day (BID) | ORAL | Status: DC

## 2013-09-26 MED ORDER — SODIUM CHLORIDE 0.9 % IV BOLUS (SEPSIS)
1000.0000 mL | Freq: Once | INTRAVENOUS | Status: AC
  Administered 2013-09-26: 1000 mL via INTRAVENOUS

## 2013-09-26 MED ORDER — PROMETHAZINE HCL 25 MG PO TABS: 25.0000 mg | ORAL_TABLET | Freq: Four times a day (QID) | ORAL | Status: DC | PRN

## 2013-09-26 MED ORDER — KETOROLAC TROMETHAMINE 30 MG/ML IJ SOLN
30.0000 mg | Freq: Once | INTRAMUSCULAR | Status: AC
  Administered 2013-09-26: 30 mg via INTRAVENOUS
  Filled 2013-09-26: qty 1

## 2013-09-26 NOTE — ED Notes (Signed)
Pt given 3 Cipro to take home to cover her until she get her prescription filled on Friday.

## 2013-09-26 NOTE — ED Notes (Signed)
Bed: WA17 Expected date:  Expected time:  Means of arrival:  Comments: EMS 

## 2013-09-26 NOTE — ED Notes (Signed)
Per EMS, pt was seen here on Monday for same c/o back pain & HA. Pt still having back pain.

## 2013-09-26 NOTE — ED Provider Notes (Signed)
CSN: [id number]     Arrival date & time 09/26/13  0033 History   First MD Initiated Contact with Patient 09/26/13 0045     Chief Complaint  Patient presents with  . Back Pain   (Consider location/radiation/quality/duration/timing/severity/associated sxs/prior Treatment) HPI Comments: She was seen in the emergency room 4 days ago, diagnosed with Tylenol, and urinary tract, infection, was given a prescription for Cipro, which she has not filled returns tonight with back pain, and dysuria.  She, states, that she still not be able to fill her prescription until Friday  Patient is a 32 y.o. female presenting with back pain. The history is provided by the patient.  Back Pain Location:  Lumbar spine and thoracic spine Quality:  Aching Pain severity:  Moderate Pain is:  Same all the time Onset quality:  Gradual Duration:  4 days Timing:  Intermittent Chronicity:  Recurrent Relieved by:  None tried Worsened by:  Nothing tried Ineffective treatments:  None tried Associated symptoms: dysuria   Associated symptoms: no bladder incontinence, no bowel incontinence, no chest pain, no fever, no headaches, no leg pain, no numbness, no paresthesias, no pelvic pain and no weakness     Past Medical History  Diagnosis Date  . Preterm labor     c/s at 36 wks  . Placenta previa     hx and with current pregnancy  . Migraines     last one over 1 month ago  . Anxiety     no meds  . Depression     no meds  . Asthma    Past Surgical History  Procedure Laterality Date  . Cesarean section  2010    x 1 at 36 wks in [place]  . Svd  2003    x 1 in [place]  . Wisdom tooth extraction    . Tubal ligation     Family History  Problem Relation Age of Onset  . Cancer Mother   . Hypertension Sister   . Sickle cell trait Daughter   . Asthma Son   . Asthma Paternal Grandmother   . Pseudochol deficiency Neg Hx   . Malignant hyperthermia Neg Hx   . Anesthesia problems Neg Hx   . Hypotension Neg Hx     History  Substance Use Topics  . Smoking status: Current Every Day Smoker -- 0.20 packs/day for 12 years    Types: Cigarettes  . Smokeless tobacco: Never Used  . Alcohol Use: No     Comment: PReviously drank on occ   OB History   Grav Para Term Preterm Abortions TAB SAB Ect Mult Living   3 3 1 2  0 0 0 0 0 3     Review of Systems  Constitutional: Negative for fever.  Cardiovascular: Negative for chest pain.  Gastrointestinal: Negative for bowel incontinence.  Genitourinary: Positive for dysuria. Negative for bladder incontinence and pelvic pain.  Musculoskeletal: Positive for back pain.  Skin: Negative for rash.  Neurological: Negative for weakness, numbness, headaches and paresthesias.    Allergies  Review of patient's allergies indicates no known allergies.  Home Medications   Current Outpatient Rx  Name  Route  Sig  Dispense  Refill  . levonorgestrel (MIRENA) 20 MCG/24HR IUD   Intrauterine   1 each by Intrauterine route continuous.          Marland Kitchen albuterol (PROVENTIL) (2.5 MG/3ML) 0.083% nebulizer solution   Nebulization   Take 2.5 mg by nebulization every 6 (six) hours as needed for wheezing.         Marland Kitchen  atomoxetine (STRATTERA) 40 MG capsule   Oral   Take 80 mg by mouth daily.         . ciprofloxacin (CIPRO) 500 MG tablet   Oral   Take 1 tablet (500 mg total) by mouth 2 (two) times daily.   3 tablet   0   . LORazepam (ATIVAN) 0.5 MG tablet   Oral   Take 1 mg by mouth every 8 (eight) hours as needed for anxiety.          . promethazine (PHENERGAN) 25 MG tablet   Oral   Take 1 tablet (25 mg total) by mouth every 6 (six) hours as needed for nausea.   12 tablet   0    BP 128/77  Pulse 72  Temp(Src) 97.8 F (36.6 C) (Oral)  Resp 20  Ht 5\' 4"  (1.626 m)  Wt 186 lb (84.369 kg)  BMI 31.91 kg/m2  SpO2 96%  LMP 09/21/2013 Physical Exam  Nursing note and vitals reviewed. Constitutional: She is oriented to person, place, and time. She appears  well-developed and well-nourished.  HENT:  Head: Normocephalic.  Eyes: Pupils are equal, round, and reactive to light.  Neck: Normal range of motion.  Cardiovascular: Normal rate and regular rhythm.   Abdominal: Soft. Bowel sounds are normal. She exhibits no distension. There is no tenderness.  Musculoskeletal: Normal range of motion.  Neurological: She is alert and oriented to person, place, and time.  Skin: Skin is warm.    ED Course  Procedures (including critical care time) Labs Review Labs Reviewed - No data to display Imaging Review No results found.  EKG Interpretation   None       MDM   1. UTI (lower urinary tract infection)    Patient states she is feeling, better.  She received 1 g of Rocephin IV, +1 L fluid I requested 3 separate tablets from the pharmacy to bridge the gap until the patient.  Can afford to fill her prescription tomorrow    Arman Filter, NP 09/26/13 0344  Arman Filter, NP 09/26/13 0345  Arman Filter, NP 09/26/13 [phone]

## 2013-09-26 NOTE — ED Provider Notes (Signed)
Medical screening examination/treatment/procedure(s) were performed by non-physician practitioner and as supervising physician I was immediately available for consultation/collaboration.    Olivia Mackie, MD 09/26/13 [phone]

## 2013-10-06 ENCOUNTER — Encounter (HOSPITAL_COMMUNITY): Payer: Self-pay

## 2013-10-06 ENCOUNTER — Inpatient Hospital Stay (HOSPITAL_COMMUNITY)
Admission: AD | Admit: 2013-10-06 | Discharge: 2013-10-06 | Disposition: A | Discharge status: Home / Self Care | Payer: Medicaid Other | Source: Ambulatory Visit | Attending: Obstetrics and Gynecology | Admitting: Obstetrics and Gynecology

## 2013-10-06 DIAGNOSIS — N390 Urinary tract infection, site not specified: Secondary | ICD-10-CM | POA: Insufficient documentation

## 2013-10-06 DIAGNOSIS — N898 Other specified noninflammatory disorders of vagina: Secondary | ICD-10-CM | POA: Insufficient documentation

## 2013-10-06 DIAGNOSIS — N949 Unspecified condition associated with female genital organs and menstrual cycle: Secondary | ICD-10-CM | POA: Insufficient documentation

## 2013-10-06 DIAGNOSIS — R300 Dysuria: Secondary | ICD-10-CM | POA: Insufficient documentation

## 2013-10-06 HISTORY — DX: Attention-deficit hyperactivity disorder, unspecified type: F90.9

## 2013-10-06 HISTORY — DX: Scoliosis, unspecified: M41.9

## 2013-10-06 LAB — URINALYSIS, ROUTINE W REFLEX MICROSCOPIC
Bilirubin Urine: NEGATIVE
Glucose, UA: NEGATIVE mg/dL
Ketones, ur: NEGATIVE mg/dL
Nitrite: NEGATIVE
Protein, ur: NEGATIVE mg/dL
Specific Gravity, Urine: >1.03 — ABNORMAL HIGH (ref 1.005–1.030)
Urobilinogen, UA: 0.2 mg/dL (ref 0.0–1.0)
pH: 5.5 (ref 5.0–8.0)

## 2013-10-06 LAB — URINE MICROSCOPIC-ADD ON

## 2013-10-06 LAB — WET PREP, GENITAL
Clue Cells Wet Prep HPF POC: NONE SEEN
Trich, Wet Prep: NONE SEEN
Yeast Wet Prep HPF POC: NONE SEEN

## 2013-10-06 LAB — POCT PREGNANCY, URINE: Preg Test, Ur: NEGATIVE

## 2013-10-06 MED ORDER — ACYCLOVIR 200 MG PO CAPS
400.0000 mg | ORAL_CAPSULE | Freq: Once | ORAL | Status: AC
  Administered 2013-10-06: 400 mg via ORAL
  Filled 2013-10-06: qty 2

## 2013-10-06 MED ORDER — ACYCLOVIR 400 MG PO TABS: ORAL_TABLET | ORAL | Status: DC

## 2013-10-06 MED ORDER — LIDOCAINE HCL 2 % EX GEL
Freq: Once | CUTANEOUS | Status: AC
  Administered 2013-10-06: 5 via TOPICAL
  Filled 2013-10-06: qty 5

## 2013-10-06 NOTE — MAU Provider Note (Signed)
History     CSN: [id number]  Arrival date and time: 10/06/13 0252   None     No chief complaint on file.  HPI  Pt is a 32 yo [id number] here with report of increased vaginal burning x one week.  Recently completed antibiotics for UTI.  Reports increased vaginal burning with urination.  +Mirena.  Noticed two small cuts at the top of the vagina that burn.    Past Medical History  Diagnosis Date  . Preterm labor     c/s at 36 wks  . Placenta previa     hx and with current pregnancy  . Migraines     last one over 1 month ago  . Anxiety     no meds  . Depression     no meds  . Asthma     Past Surgical History  Procedure Laterality Date  . Cesarean section  2010    x 1 at 36 wks in [place]  . Svd  2003    x 1 in [place]  . Wisdom tooth extraction    . Tubal ligation      Family History  Problem Relation Age of Onset  . Cancer Mother   . Hypertension Sister   . Sickle cell trait Daughter   . Asthma Son   . Asthma Paternal Grandmother   . Pseudochol deficiency Neg Hx   . Malignant hyperthermia Neg Hx   . Anesthesia problems Neg Hx   . Hypotension Neg Hx     History  Substance Use Topics  . Smoking status: Current Every Day Smoker -- 0.20 packs/day for 12 years    Types: Cigarettes  . Smokeless tobacco: Never Used  . Alcohol Use: No     Comment: PReviously drank on occ    Allergies: No Known Allergies  Prescriptions prior to admission  Medication Sig Dispense Refill  . albuterol (PROVENTIL) (2.5 MG/3ML) 0.083% nebulizer solution Take 2.5 mg by nebulization every 6 (six) hours as needed for wheezing.      Marland Kitchen atomoxetine (STRATTERA) 40 MG capsule Take 80 mg by mouth daily.      . ciprofloxacin (CIPRO) 500 MG tablet Take 1 tablet (500 mg total) by mouth 2 (two) times daily.  3 tablet  0  . levonorgestrel (MIRENA) 20 MCG/24HR IUD 1 each by Intrauterine route continuous.       Marland Kitchen LORazepam (ATIVAN) 0.5 MG tablet Take 1 mg by mouth every 8 (eight) hours as needed for  anxiety.       . promethazine (PHENERGAN) 25 MG tablet Take 1 tablet (25 mg total) by mouth every 6 (six) hours as needed for nausea.  12 tablet  0    Review of Systems  Genitourinary:       Vaginal burning and lesions  All other systems reviewed and are negative.   Physical Exam   Last menstrual period 09/21/2013.  Physical Exam  Constitutional: She is oriented to person, place, and time. She appears well-developed and well-nourished.  HENT:  Head: Normocephalic.  Eyes: Pupils are equal, round, and reactive to light.  Neck: Normal range of motion. Neck supple.  Cardiovascular: Normal rate, regular rhythm and normal heart sounds.   Respiratory: Effort normal and breath sounds normal.  GI: Soft. There is no tenderness.  Genitourinary:    There is lesion on the right labia. There is lesion on the left labia. There is erythema around the vagina. No bleeding around the vagina. No vaginal discharge found.  Musculoskeletal: Normal range of motion. She exhibits no edema.  Neurological: She is alert and oriented to person, place, and time. She has normal reflexes.  Skin: Skin is warm and dry.    MAU Course  Procedures  HSV Culture collected Serum HSV II IgG antibody drawn Wet prep and GC/CT collected  Results for orders placed during the hospital encounter of 10/06/13 (from the past 24 hour(s))  URINALYSIS, ROUTINE W REFLEX MICROSCOPIC     Status: Abnormal   Collection Time    10/06/13  3:00 AM      Result Value Range   Color, Urine YELLOW  YELLOW   APPearance CLEAR  CLEAR   Specific Gravity, Urine >1.030 (*) 1.005 - 1.030   pH 5.5  5.0 - 8.0   Glucose, UA NEGATIVE  NEGATIVE mg/dL   Hgb urine dipstick MODERATE (*) NEGATIVE   Bilirubin Urine NEGATIVE  NEGATIVE   Ketones, ur NEGATIVE  NEGATIVE mg/dL   Protein, ur NEGATIVE  NEGATIVE mg/dL   Urobilinogen, UA 0.2  0.0 - 1.0 mg/dL   Nitrite NEGATIVE  NEGATIVE   Leukocytes, UA MODERATE (*) NEGATIVE  URINE MICROSCOPIC-ADD ON      Status: None   Collection Time    10/06/13  3:00 AM      Result Value Range   Squamous Epithelial / LPF RARE  RARE   WBC, UA 3-6  <3 WBC/hpf   RBC / HPF 3-6  <3 RBC/hpf   Bacteria, UA RARE  RARE   Urine-Other MUCOUS PRESENT    POCT PREGNANCY, URINE     Status: None   Collection Time    10/06/13  3:45 AM      Result Value Range   Preg Test, Ur NEGATIVE  NEGATIVE  WET PREP, GENITAL     Status: Abnormal   Collection Time    10/06/13  4:18 AM      Result Value Range   Yeast Wet Prep HPF POC NONE SEEN  NONE SEEN   Trich, Wet Prep NONE SEEN  NONE SEEN   Clue Cells Wet Prep HPF POC NONE SEEN  NONE SEEN   WBC, Wet Prep HPF POC MODERATE (*) NONE SEEN   0500 Dr. Stefano Gaul called informed regarding pt HPI/exam >agrees with plan of care.   Assessment and Plan  UTI Vaginal Lesions - suspect herpes  Plan: Discharge home Continue Cipro  Valtrex HSV culture and lab pending Urine culture GC/CT pending  [facility] 10/06/2013, 4:26 AM

## 2013-10-07 LAB — HSV 2 ANTIBODY, IGG: HSV 2 Glycoprotein G Ab, IgG: <0.1 IV

## 2013-10-07 LAB — URINE CULTURE: Colony Count: 45000

## 2013-10-08 LAB — GC/CHLAMYDIA PROBE AMP
CT Probe RNA: NEGATIVE
GC Probe RNA: UNDETERMINED

## 2013-10-09 LAB — HERPES SIMPLEX VIRUS CULTURE: Culture: NOT DETECTED

## 2013-10-09 NOTE — Progress Notes (Signed)
Pt notified that HSV II cultures and labs were normal.  GC test indeterminate.  Pt will notify provider regarding results and will decide if going to repeat.

## 2013-10-26 ENCOUNTER — Encounter (HOSPITAL_COMMUNITY): Payer: Self-pay | Admitting: Emergency Medicine

## 2013-10-26 ENCOUNTER — Emergency Department (HOSPITAL_COMMUNITY)
Admission: EM | Admit: 2013-10-26 | Discharge: 2013-10-26 | Disposition: A | Discharge status: Home / Self Care | Payer: Medicaid Other | Attending: Emergency Medicine | Admitting: Emergency Medicine

## 2013-10-26 ENCOUNTER — Emergency Department (HOSPITAL_COMMUNITY)
Admission: EM | Admit: 2013-10-26 | Discharge: 2013-10-27 | Disposition: A | Discharge status: Home / Self Care | Payer: Medicaid Other | Source: Home / Self Care | Attending: Emergency Medicine | Admitting: Emergency Medicine

## 2013-10-26 DIAGNOSIS — F17200 Nicotine dependence, unspecified, uncomplicated: Secondary | ICD-10-CM | POA: Insufficient documentation

## 2013-10-26 DIAGNOSIS — Z79899 Other long term (current) drug therapy: Secondary | ICD-10-CM | POA: Insufficient documentation

## 2013-10-26 DIAGNOSIS — R42 Dizziness and giddiness: Secondary | ICD-10-CM

## 2013-10-26 DIAGNOSIS — Z8751 Personal history of pre-term labor: Secondary | ICD-10-CM | POA: Insufficient documentation

## 2013-10-26 DIAGNOSIS — R109 Unspecified abdominal pain: Secondary | ICD-10-CM | POA: Insufficient documentation

## 2013-10-26 DIAGNOSIS — G43909 Migraine, unspecified, not intractable, without status migrainosus: Secondary | ICD-10-CM | POA: Insufficient documentation

## 2013-10-26 DIAGNOSIS — F419 Anxiety disorder, unspecified: Secondary | ICD-10-CM

## 2013-10-26 DIAGNOSIS — F411 Generalized anxiety disorder: Secondary | ICD-10-CM | POA: Insufficient documentation

## 2013-10-26 DIAGNOSIS — Z8739 Personal history of other diseases of the musculoskeletal system and connective tissue: Secondary | ICD-10-CM | POA: Insufficient documentation

## 2013-10-26 DIAGNOSIS — R300 Dysuria: Secondary | ICD-10-CM | POA: Insufficient documentation

## 2013-10-26 DIAGNOSIS — Z8679 Personal history of other diseases of the circulatory system: Secondary | ICD-10-CM | POA: Insufficient documentation

## 2013-10-26 DIAGNOSIS — R110 Nausea: Secondary | ICD-10-CM | POA: Insufficient documentation

## 2013-10-26 DIAGNOSIS — R5381 Other malaise: Secondary | ICD-10-CM | POA: Insufficient documentation

## 2013-10-26 DIAGNOSIS — Z3202 Encounter for pregnancy test, result negative: Secondary | ICD-10-CM | POA: Insufficient documentation

## 2013-10-26 DIAGNOSIS — J45909 Unspecified asthma, uncomplicated: Secondary | ICD-10-CM | POA: Insufficient documentation

## 2013-10-26 LAB — COMPREHENSIVE METABOLIC PANEL
ALT: 13 U/L (ref 0–35)
AST: 14 U/L (ref 0–37)
Albumin: 3.8 g/dL (ref 3.5–5.2)
Alkaline Phosphatase: 74 U/L (ref 39–117)
BUN: 8 mg/dL (ref 6–23)
CO2: 24 mEq/L (ref 19–32)
Calcium: 9 mg/dL (ref 8.4–10.5)
Chloride: 103 mEq/L (ref 96–112)
Creatinine, Ser: 0.51 mg/dL (ref 0.50–1.10)
GFR calc Af Amer: >90 mL/min (ref >90)
GFR calc non Af Amer: >90 mL/min (ref >90)
Glucose, Bld: 115 mg/dL — ABNORMAL HIGH (ref 70–99)
Potassium: 3.6 mEq/L (ref 3.5–5.1)
Sodium: 138 mEq/L (ref 135–145)
Total Bilirubin: 0.5 mg/dL (ref 0.3–1.2)
Total Protein: 7.4 g/dL (ref 6.0–8.3)

## 2013-10-26 LAB — CBC WITH DIFFERENTIAL/PLATELET
Basophils Absolute: 0.1 10*3/uL (ref 0.0–0.1)
Basophils Relative: 1 % (ref 0–1)
Eosinophils Absolute: 0.3 10*3/uL (ref 0.0–0.7)
Eosinophils Relative: 4 % (ref 0–5)
HCT: 42 % (ref 36.0–46.0)
Hemoglobin: 14.5 g/dL (ref 12.0–15.0)
Lymphocytes Relative: 28 % (ref 12–46)
Lymphs Abs: 2.5 10*3/uL (ref 0.7–4.0)
MCH: 28.6 pg (ref 26.0–34.0)
MCHC: 34.5 g/dL (ref 30.0–36.0)
MCV: 82.8 fL (ref 78.0–100.0)
Monocytes Absolute: 0.5 10*3/uL (ref 0.1–1.0)
Monocytes Relative: 5 % (ref 3–12)
Neutro Abs: 5.8 10*3/uL (ref 1.7–7.7)
Neutrophils Relative %: 63 % (ref 43–77)
Platelets: 346 10*3/uL (ref 150–400)
RBC: 5.07 MIL/uL (ref 3.87–5.11)
RDW: 14.3 % (ref 11.5–15.5)
WBC: 9.2 10*3/uL (ref 4.0–10.5)

## 2013-10-26 LAB — URINALYSIS, ROUTINE W REFLEX MICROSCOPIC
Bilirubin Urine: NEGATIVE
Glucose, UA: NEGATIVE mg/dL
Hgb urine dipstick: NEGATIVE
Ketones, ur: NEGATIVE mg/dL
Leukocytes, UA: NEGATIVE
Nitrite: NEGATIVE
Protein, ur: NEGATIVE mg/dL
Specific Gravity, Urine: 1.022 (ref 1.005–1.030)
Urobilinogen, UA: 0.2 mg/dL (ref 0.0–1.0)
pH: 7 (ref 5.0–8.0)

## 2013-10-26 LAB — POCT PREGNANCY, URINE: Preg Test, Ur: NEGATIVE

## 2013-10-26 MED ORDER — CLOTRIMAZOLE 1 % VA CREA: 1.0000 | TOPICAL_CREAM | Freq: Every day | VAGINAL | Status: DC

## 2013-10-26 MED ORDER — ONDANSETRON HCL 4 MG/2ML IJ SOLN: 4.0000 mg | Freq: Once | INTRAMUSCULAR | Status: DC

## 2013-10-26 MED ORDER — SODIUM CHLORIDE 0.9 % IV BOLUS (SEPSIS)
1000.0000 mL | Freq: Once | INTRAVENOUS | Status: AC
  Administered 2013-10-26: 1000 mL via INTRAVENOUS

## 2013-10-26 MED ORDER — LORAZEPAM 2 MG/ML IJ SOLN
1.0000 mg | Freq: Once | INTRAMUSCULAR | Status: AC
Start: 2013-10-26 — End: 2013-10-26
  Administered 2013-10-26: 1 mg via INTRAVENOUS
  Filled 2013-10-26: qty 1

## 2013-10-26 MED ORDER — PROMETHAZINE HCL 25 MG/ML IJ SOLN
25.0000 mg | Freq: Once | INTRAMUSCULAR | Status: AC
  Administered 2013-10-26: 25 mg via INTRAVENOUS
  Filled 2013-10-26: qty 1

## 2013-10-26 MED ORDER — PROMETHAZINE HCL 25 MG PO TABS: 25.0000 mg | ORAL_TABLET | Freq: Four times a day (QID) | ORAL | Status: DC | PRN

## 2013-10-26 MED ORDER — DIPHENHYDRAMINE HCL 25 MG PO CAPS
25.0000 mg | ORAL_CAPSULE | Freq: Once | ORAL | Status: AC
  Administered 2013-10-26: 25 mg via ORAL
  Filled 2013-10-26: qty 1

## 2013-10-26 NOTE — ED Notes (Signed)
As I speak with her, she tells me she is also having some dysuria and bilat. Flank area discomfort.  She states she is currently on her period, which began yesterday.

## 2013-10-26 NOTE — ED Notes (Signed)
PT complains of the facility providing only a bus voucher and not cab vouchers. PT consulted provider.

## 2013-10-26 NOTE — ED Notes (Signed)
Bed: WA06 Expected date:  Expected time:  Means of arrival:  Comments: EMS/headache/weakness

## 2013-10-26 NOTE — ED Notes (Signed)
She states she began to "feel bad at work yesterday".  Today she continues to "not feel right and I have a dry cough".  She is in no distress.

## 2013-10-26 NOTE — ED Provider Notes (Signed)
CSN: [id number]     Arrival date & time 10/26/13  1310 History   First MD Initiated Contact with Patient 10/26/13 1311     Chief Complaint  Patient presents with  . Cough    Also abd./flank pain  . Abdominal Pain  . Near Syncope   (Consider location/radiation/quality/duration/timing/severity/associated sxs/prior Treatment) The history is provided by the patient and medical records. No language interpreter was used.    Penny [name] is a 32 y.o. female  with a hx of migraine, anxiety, depression, asthma presents to the Emergency Department complaining of gradual, persistent, progressively worsening lightheadedness onset yesterday evening with associated nausea without vomiting.  Pt has been able to tolerate clear liquids without difficulty. Pt feels like she is going to have an anxiety attack. Pt also endorses associated fatigue, dysuria.   Laying flat makes it better and changes of position makes it worse.  Pt denies fever, chills, headache, neck pain, sore throat, otalgia, nasal congestion, chest pain, SOB, abd pain, vomiting, diarrhea, weakness, syncope.  Pt did not get a flu shot.  She denies sick contacts.     Past Medical History  Diagnosis Date  . Preterm labor     c/s at 36 wks  . Placenta previa     hx and with current pregnancy  . Migraines     last one over 1 month ago  . Anxiety     no meds  . Depression     no meds  . Asthma   . ADHD (attention deficit hyperactivity disorder)   . Scoliosis    Past Surgical History  Procedure Laterality Date  . Cesarean section  2010    x 1 at 36 wks in [place]  . Svd  2003    x 1 in [place]  . Wisdom tooth extraction    . Tubal ligation     Family History  Problem Relation Age of Onset  . Cancer Mother   . Hypertension Sister   . Sickle cell trait Daughter   . Asthma Son   . Asthma Paternal Grandmother   . Pseudochol deficiency Neg Hx   . Malignant hyperthermia Neg Hx   . Anesthesia problems Neg Hx   . Hypotension Neg  Hx    History  Substance Use Topics  . Smoking status: Current Every Day Smoker -- 0.20 packs/day for 12 years    Types: Cigarettes  . Smokeless tobacco: Never Used  . Alcohol Use: No     Comment: PReviously drank on occ   OB History   Grav Para Term Preterm Abortions TAB SAB Ect Mult Living   3 3 1 2  0 0 0 0 0 3     Review of Systems  Constitutional: Positive for fatigue. Negative for fever, diaphoresis, appetite change and unexpected weight change.  HENT: Negative for mouth sores.   Eyes: Negative for visual disturbance.  Respiratory: Positive for cough (dry cough, beginning this AM). Negative for chest tightness, shortness of breath and wheezing.   Cardiovascular: Negative for chest pain.  Gastrointestinal: Positive for nausea. Negative for vomiting, abdominal pain, diarrhea and constipation.  Endocrine: Negative for polydipsia, polyphagia and polyuria.  Genitourinary: Negative for dysuria, urgency, frequency and hematuria.  Musculoskeletal: Negative for back pain and neck stiffness.  Skin: Negative for rash.  Allergic/Immunologic: Negative for immunocompromised state.  Neurological: Positive for light-headedness. Negative for syncope and headaches.  Hematological: Does not bruise/bleed easily.  Psychiatric/Behavioral: Negative for sleep disturbance. The patient is not nervous/anxious.  Allergies  Review of patient's allergies indicates no known allergies.  Home Medications   Current Outpatient Rx  Name  Route  Sig  Dispense  Refill  . acyclovir (ZOVIRAX) 400 MG tablet      Take 400mg  three times a day x 7 days. Then if lesions reappear take 400 mg three times a day x 5 days.   90 tablet   1   . levonorgestrel (MIRENA) 20 MCG/24HR IUD   Intrauterine   1 each by Intrauterine route continuous.          Marland Kitchen LORazepam (ATIVAN) 0.5 MG tablet   Oral   Take 1 mg by mouth every 8 (eight) hours as needed for anxiety.          Marland Kitchen oxyCODONE-acetaminophen (PERCOCET)  10-325 MG per tablet   Oral   Take 1 tablet by mouth every 4 (four) hours as needed for pain.         . promethazine (PHENERGAN) 25 MG tablet   Oral   Take 1 tablet (25 mg total) by mouth every 6 (six) hours as needed for nausea.   12 tablet   0   . promethazine (PHENERGAN) 25 MG tablet   Oral   Take 1 tablet (25 mg total) by mouth every 6 (six) hours as needed for nausea or vomiting.   12 tablet   0    BP 127/86  Pulse 95  Temp(Src) 97.8 F (36.6 C) (Oral)  SpO2 100%  LMP 10/25/2013 Physical Exam  Nursing note and vitals reviewed. Constitutional: She is oriented to person, place, and time. She appears well-developed and well-nourished. No distress.  Awake, alert, nontoxic appearance  HENT:  Head: Normocephalic and atraumatic.  Mouth/Throat: Uvula is midline and oropharynx is clear and moist. Mucous membranes are dry. No uvula swelling. No oropharyngeal exudate, posterior oropharyngeal edema, posterior oropharyngeal erythema or tonsillar abscesses.  Dry mucous membranes  Eyes: Conjunctivae and EOM are normal. Pupils are equal, round, and reactive to light. No scleral icterus.  Neck: Normal range of motion. Neck supple.  Cardiovascular: Normal rate, regular rhythm, normal heart sounds and intact distal pulses.   No murmur heard. Pulmonary/Chest: Effort normal and breath sounds normal. No respiratory distress. She has no wheezes. She has no rales.  Abdominal: Soft. Bowel sounds are normal. She exhibits no distension and no mass. There is tenderness. There is no rebound and no guarding.  Very mild generalized tenderness  Musculoskeletal: Normal range of motion. She exhibits no edema.  Lymphadenopathy:    She has no cervical adenopathy.  Neurological: She is alert and oriented to person, place, and time. She has normal reflexes. No cranial nerve deficit. She exhibits normal muscle tone. Coordination normal.  Speech is clear and goal oriented, follows commands Cranial nerves  III - XII without deficit, no facial droop Normal strength in upper and lower extremities bilaterally, strong and equal grip strength Sensation normal to light and sharp touch Moves extremities without ataxia, coordination intact Normal finger to nose and rapid alternating movements Neg romberg, no pronator drift Normal gait Normal heel-shin and balance   Skin: Skin is warm and dry. No rash noted. She is not diaphoretic. No erythema.  Psychiatric: She has a normal mood and affect. Her behavior is normal. Judgment and thought content normal.    ED Course  Procedures (including critical care time) Labs Review Labs Reviewed  COMPREHENSIVE METABOLIC PANEL - Abnormal; Notable for the following:    Glucose, Bld 115 (*)  All other components within normal limits  URINALYSIS, ROUTINE W REFLEX MICROSCOPIC  CBC WITH DIFFERENTIAL  POCT PREGNANCY, URINE   Imaging Review No results found.  EKG Interpretation   None       MDM   1. Lightheadedness   2. Anxiety   3. Nausea alone      Lelia Jons presents with several complaints, but biggest concern is her lightheadedness.  Patient reports that this feels similar to her anxiety attacks but not exactly the same.  She attempted to take Ativan 1 mg by mouth last eye before symptoms began feels as if it did not help but has not repeated the dosage.  4:47 PM Patient given 1 L of fluid with resolution of positional orthostasis and nausea. CBC, CMP unremarkable, pregnancy test negative and UA without evidence of urinary tract infection.  Patient with complete resolution of abdominal pain after fluid bolus.  Will attempt by mouth trial and plan for discharge home with PCP followup.  5:14 PM Pt tolerates PO without difficulty and ambulates with steady gait without assistance.  Pt symptoms likely of viral nature.  Pt is to follow-up with PCP on Monday or return for worsening symptoms.  Pt states understanding of this and agrees with the  plan.  It has been determined that no acute conditions requiring further emergency intervention are present at this time. The patient/guardian have been advised of the diagnosis and plan. We have discussed signs and symptoms that warrant return to the ED, such as changes or worsening in symptoms.   Vital signs are stable at discharge.   BP 127/86  Pulse 95  Temp(Src) 97.8 F (36.6 C) (Oral)  SpO2 100%  LMP 10/25/2013  Patient/guardian has voiced understanding and agreed to follow-up with the PCP or specialist.      Dierdre Forth, PA-C 10/26/13 1715

## 2013-10-26 NOTE — ED Notes (Signed)
Pt upset due to no ride, pt was given bus pass, security transported pt to bus stop, pt told tech A Rhoney she would be back in an hour, states she would probably go to bus stop and fall, it would be much worse.

## 2013-10-26 NOTE — ED Notes (Signed)
Bed: WTR6 Expected date: 10/26/13 Expected time: 1:07 PM Means of arrival: Ambulance Comments: Dry cough

## 2013-10-26 NOTE — ED Notes (Signed)
Provider located and notified of PT request

## 2013-10-26 NOTE — ED Notes (Signed)
PT requesting to speak to provider after signing discharge paperwork . Patient has question about cab voucher and question for provider.

## 2013-10-27 ENCOUNTER — Encounter (HOSPITAL_COMMUNITY): Payer: Self-pay | Admitting: Emergency Medicine

## 2013-10-27 ENCOUNTER — Emergency Department (HOSPITAL_COMMUNITY): Payer: Medicaid Other

## 2013-10-27 MED ORDER — MECLIZINE HCL 25 MG PO TABS
25.0000 mg | ORAL_TABLET | Freq: Once | ORAL | Status: AC
Start: 2013-10-27 — End: 2013-10-27
  Administered 2013-10-27: 25 mg via ORAL
  Filled 2013-10-27: qty 1

## 2013-10-27 NOTE — ED Provider Notes (Signed)
Medical screening examination/treatment/procedure(s) were performed by non-physician practitioner and as supervising physician I was immediately available for consultation/collaboration.  EKG Interpretation   None         Audree Camel, MD 10/27/13 [phone]

## 2013-10-27 NOTE — ED Notes (Signed)
Pt denies pain but says she is feeling dizzy. Pt was just discharged a few hours ago from this location.

## 2013-10-27 NOTE — ED Provider Notes (Signed)
Medical screening examination/treatment/procedure(s) were performed by non-physician practitioner and as supervising physician I was immediately available for consultation/collaboration.  EKG Interpretation   None         Gwyneth Sprout, MD 10/27/13 [phone]

## 2013-10-27 NOTE — ED Provider Notes (Signed)
CSN: [id number]     Arrival date & time 10/26/13  2351 History   First MD Initiated Contact with Patient 10/27/13 0059     Chief Complaint  Patient presents with  . Dizziness  . Headache   HPI  Patient seen and evaluated. Patient is a 32 year old female with history of anxiety and depression and migraines returns to the emergency room with complaints of lightheadedness and dizziness. Patient was seen in the emergency department less than 24 hours ago for similar symptoms. She states she was told she may be having the start of a viral infection. Patient however states that she has no other symptoms and does not feel this is an infection. Her as lightheadedness and dizziness occurs when she stands up and begins to move around. It is better at rest with sitting or laying down. She has not had any nausea, vomiting or diarrhea. She doesn't that she may have had decreased fluid intake. She does not feel dehydrated has had normal urination. Denies any fever, chills or sweats. She has had occasional headache symptoms but denies headache now. No other aggravating or alleviating factors. No other associated symptoms.    Past Medical History  Diagnosis Date  . Preterm labor     c/s at 36 wks  . Placenta previa     hx and with current pregnancy  . Migraines     last one over 1 month ago  . Anxiety     no meds  . Depression     no meds  . Asthma   . ADHD (attention deficit hyperactivity disorder)   . Scoliosis    Past Surgical History  Procedure Laterality Date  . Cesarean section  2010    x 1 at 36 wks in [place]  . Svd  2003    x 1 in [place]  . Wisdom tooth extraction    . Tubal ligation     Family History  Problem Relation Age of Onset  . Cancer Mother   . Hypertension Sister   . Sickle cell trait Daughter   . Asthma Son   . Asthma Paternal Grandmother   . Pseudochol deficiency Neg Hx   . Malignant hyperthermia Neg Hx   . Anesthesia problems Neg Hx   . Hypotension Neg Hx     History  Substance Use Topics  . Smoking status: Current Every Day Smoker -- 0.20 packs/day for 12 years    Types: Cigarettes  . Smokeless tobacco: Never Used  . Alcohol Use: No     Comment: PReviously drank on occ   OB History   Grav Para Term Preterm Abortions TAB SAB Ect Mult Living   3 3 1 2  0 0 0 0 0 3     Review of Systems  Constitutional: Negative for fever.  HENT: Negative for congestion, ear discharge, ear pain, sinus pressure and sore throat.   Respiratory: Negative for cough and shortness of breath.   Musculoskeletal: Negative for neck pain and neck stiffness.  Neurological: Positive for dizziness, light-headedness and headaches. Negative for weakness and numbness.  All other systems reviewed and are negative.    Allergies  Review of patient's allergies indicates no known allergies.  Home Medications   Current Outpatient Rx  Name  Route  Sig  Dispense  Refill  . clotrimazole (GYNE-LOTRIMIN) 1 % vaginal cream   Vaginal   Place 1 Applicatorful vaginally at bedtime.   45 g   0   . levonorgestrel (MIRENA) 20 MCG/24HR  IUD   Intrauterine   1 each by Intrauterine route continuous.          Marland Kitchen LORazepam (ATIVAN) 0.5 MG tablet   Oral   Take 1 mg by mouth every 8 (eight) hours as needed for anxiety.          Marland Kitchen oxyCODONE-acetaminophen (PERCOCET) 10-325 MG per tablet   Oral   Take 1 tablet by mouth every 4 (four) hours as needed for pain.         . promethazine (PHENERGAN) 25 MG tablet   Oral   Take 1 tablet (25 mg total) by mouth every 6 (six) hours as needed for nausea.   12 tablet   0    BP 134/86  Pulse 105  Temp(Src) 97.9 F (36.6 C) (Oral)  Resp 18  SpO2 99%  LMP 10/25/2013 Physical Exam  Nursing note and vitals reviewed. Constitutional: She is oriented to person, place, and time. She appears well-developed and well-nourished. No distress.  HENT:  Head: Normocephalic and atraumatic.  Eyes: Conjunctivae and EOM are normal. Pupils are  equal, round, and reactive to light.  Neck: Normal range of motion. Neck supple.  No meningeal signs  Cardiovascular: Normal rate and regular rhythm.   No murmur heard. Pulmonary/Chest: Effort normal and breath sounds normal. No respiratory distress. She has no wheezes. She has no rales.  Abdominal: Soft. There is no tenderness. There is no rigidity, no rebound, no guarding, no CVA tenderness and no tenderness at McBurney's point.  Neurological: She is alert and oriented to person, place, and time. She has normal strength. No cranial nerve deficit or sensory deficit. Gait normal.  Skin: Skin is warm and dry. No rash noted.  Psychiatric: She has a normal mood and affect. Her behavior is normal.    ED Course  Procedures   DIAGNOSTIC STUDIES: Oxygen Saturation is 99% on room air.    COORDINATION OF CARE:  Nursing notes reviewed. Vital signs reviewed. Initial pt interview and examination performed.   2:03 AM-patient seen and evaluated. Patient appears well no acute distress. She was seen and evaluated less than 24 hours ago for similar symptoms. Diagnosed with possible early viral infection. No other sniffing change in symptoms. She had very complete and normal laboratory testing. No focal neuro deficits. Patient very adamant about additional testing. Under duress I have agreed to order a CT scan of head. Discussed work up plan with pt at bedside, which includes CT scan. Pt agrees with plan.  Patient had unremarkable lab testing earlier. CT scan also normal without a concern because of lightheadedness and dizziness symptoms. Patient does have increased heart rate orthostatics. Patient advised to increase fluid intake and discuss further evaluation and treatment with her primary care provider. We also discussed possibilities of side effects from medications causing her lightheadedness and dizziness symptoms. Patient agrees with plan and will make an appointment with her doctor.   Treatment plan  initiated: Medications  meclizine (ANTIVERT) tablet 25 mg (25 mg Oral Given 10/27/13 0235)     Results for orders placed during the hospital encounter of 10/26/13  URINALYSIS, ROUTINE W REFLEX MICROSCOPIC      Result Value Range   Color, Urine YELLOW  YELLOW   APPearance CLEAR  CLEAR   Specific Gravity, Urine 1.022  1.005 - 1.030   pH 7.0  5.0 - 8.0   Glucose, UA NEGATIVE  NEGATIVE mg/dL   Hgb urine dipstick NEGATIVE  NEGATIVE   Bilirubin Urine NEGATIVE  NEGATIVE  Ketones, ur NEGATIVE  NEGATIVE mg/dL   Protein, ur NEGATIVE  NEGATIVE mg/dL   Urobilinogen, UA 0.2  0.0 - 1.0 mg/dL   Nitrite NEGATIVE  NEGATIVE   Leukocytes, UA NEGATIVE  NEGATIVE  CBC WITH DIFFERENTIAL      Result Value Range   WBC 9.2  4.0 - 10.5 K/uL   RBC 5.07  3.87 - 5.11 MIL/uL   Hemoglobin 14.5  12.0 - 15.0 g/dL   HCT 16.1  09.6 - 04.5 %   MCV 82.8  78.0 - 100.0 fL   MCH 28.6  26.0 - 34.0 pg   MCHC 34.5  30.0 - 36.0 g/dL   RDW 40.9  81.1 - 91.4 %   Platelets 346  150 - 400 K/uL   Neutrophils Relative % 63  43 - 77 %   Neutro Abs 5.8  1.7 - 7.7 K/uL   Lymphocytes Relative 28  12 - 46 %   Lymphs Abs 2.5  0.7 - 4.0 K/uL   Monocytes Relative 5  3 - 12 %   Monocytes Absolute 0.5  0.1 - 1.0 K/uL   Eosinophils Relative 4  0 - 5 %   Eosinophils Absolute 0.3  0.0 - 0.7 K/uL   Basophils Relative 1  0 - 1 %   Basophils Absolute 0.1  0.0 - 0.1 K/uL  COMPREHENSIVE METABOLIC PANEL      Result Value Range   Sodium 138  135 - 145 mEq/L   Potassium 3.6  3.5 - 5.1 mEq/L   Chloride 103  96 - 112 mEq/L   CO2 24  19 - 32 mEq/L   Glucose, Bld 115 (*) 70 - 99 mg/dL   BUN 8  6 - 23 mg/dL   Creatinine, Ser 7.82  0.50 - 1.10 mg/dL   Calcium 9.0  8.4 - 95.6 mg/dL   Total Protein 7.4  6.0 - 8.3 g/dL   Albumin 3.8  3.5 - 5.2 g/dL   AST 14  0 - 37 U/L   ALT 13  0 - 35 U/L   Alkaline Phosphatase 74  39 - 117 U/L   Total Bilirubin 0.5  0.3 - 1.2 mg/dL   GFR calc non Af Amer >90  >90 mL/min   GFR calc Af Amer >90  >90  mL/min  POCT PREGNANCY, URINE      Result Value Range   Preg Test, Ur NEGATIVE  NEGATIVE     Imaging Review Ct Head Wo Contrast  10/27/2013   CLINICAL DATA:  Headache with dizziness.  EXAM: CT HEAD WITHOUT CONTRAST  TECHNIQUE: Contiguous axial images were obtained from the base of the skull through the vertex without contrast.  COMPARISON:  None  FINDINGS: Normal appearance of the intracranial structures. No evidence for acute hemorrhage, mass lesion, midline shift, hydrocephalus or large infarct. No acute bony abnormality. The visualized sinuses are clear.  IMPRESSION: No acute intracranial abnormality.   Electronically Signed   By: Davonna Belling M.D.   On: 10/27/2013 01:57      MDM   1. Orthostatic lightheadedness        Angus Seller, [place] 10/27/13 [phone]

## 2013-10-27 NOTE — ED Notes (Signed)
Pt denies headache at present. States that she just feels dizzy. States she feels like she is going to pass out when she stands up. Pt states "they said I had a virus, and I know my body and I don't have a virus." Pt a/ox 4. No acute distress.

## 2013-10-27 NOTE — ED Notes (Signed)
Pt BIB EMS. Pt c/o dizziness, weakness and headache. Pt was seen earlier today for the same c/o. Pt told EMS that she went home and slept and then the dizziness got worse. Pt also states her headache is back.

## 2013-11-06 ENCOUNTER — Encounter (HOSPITAL_COMMUNITY): Payer: Self-pay | Admitting: Emergency Medicine

## 2013-11-06 ENCOUNTER — Emergency Department (HOSPITAL_COMMUNITY)
Admission: EM | Admit: 2013-11-06 | Discharge: 2013-11-06 | Disposition: A | Discharge status: Home / Self Care | Payer: Medicaid Other | Attending: Emergency Medicine | Admitting: Emergency Medicine

## 2013-11-06 DIAGNOSIS — M62830 Muscle spasm of back: Secondary | ICD-10-CM

## 2013-11-06 DIAGNOSIS — M4120 Other idiopathic scoliosis, site unspecified: Secondary | ICD-10-CM | POA: Insufficient documentation

## 2013-11-06 DIAGNOSIS — M5380 Other specified dorsopathies, site unspecified: Secondary | ICD-10-CM | POA: Insufficient documentation

## 2013-11-06 DIAGNOSIS — F419 Anxiety disorder, unspecified: Secondary | ICD-10-CM

## 2013-11-06 DIAGNOSIS — G479 Sleep disorder, unspecified: Secondary | ICD-10-CM | POA: Insufficient documentation

## 2013-11-06 DIAGNOSIS — F329 Major depressive disorder, single episode, unspecified: Secondary | ICD-10-CM | POA: Insufficient documentation

## 2013-11-06 DIAGNOSIS — F411 Generalized anxiety disorder: Secondary | ICD-10-CM | POA: Insufficient documentation

## 2013-11-06 DIAGNOSIS — Z8679 Personal history of other diseases of the circulatory system: Secondary | ICD-10-CM | POA: Insufficient documentation

## 2013-11-06 DIAGNOSIS — F17200 Nicotine dependence, unspecified, uncomplicated: Secondary | ICD-10-CM | POA: Insufficient documentation

## 2013-11-06 DIAGNOSIS — F3289 Other specified depressive episodes: Secondary | ICD-10-CM | POA: Insufficient documentation

## 2013-11-06 DIAGNOSIS — J45909 Unspecified asthma, uncomplicated: Secondary | ICD-10-CM | POA: Insufficient documentation

## 2013-11-06 DIAGNOSIS — R51 Headache: Secondary | ICD-10-CM | POA: Insufficient documentation

## 2013-11-06 DIAGNOSIS — Z8751 Personal history of pre-term labor: Secondary | ICD-10-CM | POA: Insufficient documentation

## 2013-11-06 DIAGNOSIS — R112 Nausea with vomiting, unspecified: Secondary | ICD-10-CM | POA: Insufficient documentation

## 2013-11-06 DIAGNOSIS — Z79899 Other long term (current) drug therapy: Secondary | ICD-10-CM | POA: Insufficient documentation

## 2013-11-06 MED ORDER — ONDANSETRON 4 MG PO TBDP
4.0000 mg | ORAL_TABLET | Freq: Once | ORAL | Status: AC
  Administered 2013-11-06: 4 mg via ORAL
  Filled 2013-11-06: qty 1

## 2013-11-06 MED ORDER — DIAZEPAM 2 MG PO TABS
2.0000 mg | ORAL_TABLET | Freq: Once | ORAL | Status: AC
Start: 2013-11-06 — End: 2013-11-06
  Administered 2013-11-06: 2 mg via ORAL
  Filled 2013-11-06: qty 1

## 2013-11-06 MED ORDER — DIAZEPAM 2 MG PO TABS: 2.0000 mg | ORAL_TABLET | Freq: Every day | ORAL | Status: DC

## 2013-11-06 NOTE — ED Notes (Signed)
Patient is alert and oriented x3.  She states that she was trying to sleep and tonight started having a panic attack. Patient takes ativan at home but it was not taking care of her panic

## 2013-11-06 NOTE — ED Notes (Signed)
Patient is alert and oriented x3.  She was given DC instructions and follow up visit instructions.  Patient gave verbal understanding. She was DC ambulatory under her own power to home.  V/S stable.  He was not showing any signs of distress on DC 

## 2013-11-06 NOTE — ED Provider Notes (Signed)
CSN: [id number]     Arrival date & time 11/06/13  1191 History   First MD Initiated Contact with Patient 11/06/13 0326     Chief Complaint  Patient presents with  . Panic Attack   (Consider location/radiation/quality/duration/timing/severity/associated sxs/prior Treatment) HPI Comments: Patient states she was having back pain, and headache.  She took a Percocet, and try to sleep.  This was unsuccessful.  She got up and took an Ativan.  Again try to sleep.  She does for a short period of time or children.  Walker.  She settled them back down and then tried to slip again, and could not she noticed her heart was beating fast.  Indicative of her typical panic attacks.  She came by ambulance to the emergency department  The history is provided by the patient.    Past Medical History  Diagnosis Date  . Preterm labor     c/s at 36 wks  . Placenta previa     hx and with current pregnancy  . Migraines     last one over 1 month ago  . Anxiety     no meds  . Depression     no meds  . Asthma   . ADHD (attention deficit hyperactivity disorder)   . Scoliosis    Past Surgical History  Procedure Laterality Date  . Cesarean section  2010    x 1 at 36 wks in [place]  . Svd  2003    x 1 in [place]  . Wisdom tooth extraction    . Tubal ligation     Family History  Problem Relation Age of Onset  . Cancer Mother   . Hypertension Sister   . Sickle cell trait Daughter   . Asthma Son   . Asthma Paternal Grandmother   . Pseudochol deficiency Neg Hx   . Malignant hyperthermia Neg Hx   . Anesthesia problems Neg Hx   . Hypotension Neg Hx    History  Substance Use Topics  . Smoking status: Current Every Day Smoker -- 0.20 packs/day for 12 years    Types: Cigarettes  . Smokeless tobacco: Never Used  . Alcohol Use: No     Comment: PReviously drank on occ   OB History   Grav Para Term Preterm Abortions TAB SAB Ect Mult Living   3 3 1 2  0 0 0 0 0 3     Review of Systems  Constitutional:  Negative for fever and chills.  Gastrointestinal: Positive for nausea and vomiting.  Musculoskeletal: Positive for back pain.  Neurological: Negative for dizziness and headaches.  Psychiatric/Behavioral: Positive for sleep disturbance. The patient is nervous/anxious.   All other systems reviewed and are negative.    Allergies  Review of patient's allergies indicates no known allergies.  Home Medications   Current Outpatient Rx  Name  Route  Sig  Dispense  Refill  . clotrimazole (GYNE-LOTRIMIN) 1 % vaginal cream   Vaginal   Place 1 Applicatorful vaginally at bedtime.   45 g   0   . levonorgestrel (MIRENA) 20 MCG/24HR IUD   Intrauterine   1 each by Intrauterine route continuous.          Marland Kitchen LORazepam (ATIVAN) 0.5 MG tablet   Oral   Take 1 mg by mouth every 8 (eight) hours as needed for anxiety.          Marland Kitchen oxyCODONE-acetaminophen (PERCOCET) 10-325 MG per tablet   Oral   Take 1 tablet by mouth every  4 (four) hours as needed for pain.         . promethazine (PHENERGAN) 25 MG tablet   Oral   Take 1 tablet (25 mg total) by mouth every 6 (six) hours as needed for nausea.   12 tablet   0   . diazepam (VALIUM) 2 MG tablet   Oral   Take 1 tablet (2 mg total) by mouth at bedtime.   12 tablet   0    BP 123/81  Pulse 106  Temp(Src) 98.6 F (37 C) (Oral)  Resp 24  SpO2 98%  LMP 10/25/2013 Physical Exam  Nursing note and vitals reviewed. Constitutional: She is oriented to person, place, and time. She appears well-developed and well-nourished.  HENT:  Head: Normocephalic.  Eyes: Pupils are equal, round, and reactive to light.  Neck: Normal range of motion.  Cardiovascular: Normal rate and regular rhythm.   Abdominal: Soft. She exhibits no distension.  Musculoskeletal: Normal range of motion.  Patient has scoliosis and muscle spasm in the left flank area  Neurological: She is alert and oriented to person, place, and time.  Skin: Skin is warm and dry. No rash  noted.    ED Course  Procedures (including critical care time) Labs Review Labs Reviewed - No data to display Imaging Review No results found.  EKG Interpretation   None       MDM   1. Anxiety   2. Muscle spasm of back        Arman Filter, NP 11/06/13 [phone]

## 2013-11-08 NOTE — ED Provider Notes (Signed)
Medical screening examination/treatment/procedure(s) were performed by non-physician practitioner and as supervising physician I was immediately available for consultation/collaboration.  EKG Interpretation   None        Enid Skeens, MD 11/08/13 1146

## 2013-11-12 ENCOUNTER — Emergency Department (HOSPITAL_COMMUNITY)
Admission: EM | Admit: 2013-11-12 | Discharge: 2013-11-12 | Disposition: A | Discharge status: Home / Self Care | Payer: Medicaid Other | Attending: Emergency Medicine | Admitting: Emergency Medicine

## 2013-11-12 ENCOUNTER — Encounter (HOSPITAL_COMMUNITY): Payer: Self-pay | Admitting: Emergency Medicine

## 2013-11-12 DIAGNOSIS — J45909 Unspecified asthma, uncomplicated: Secondary | ICD-10-CM | POA: Insufficient documentation

## 2013-11-12 DIAGNOSIS — Z8679 Personal history of other diseases of the circulatory system: Secondary | ICD-10-CM | POA: Insufficient documentation

## 2013-11-12 DIAGNOSIS — F17200 Nicotine dependence, unspecified, uncomplicated: Secondary | ICD-10-CM | POA: Insufficient documentation

## 2013-11-12 DIAGNOSIS — F411 Generalized anxiety disorder: Secondary | ICD-10-CM | POA: Insufficient documentation

## 2013-11-12 DIAGNOSIS — M549 Dorsalgia, unspecified: Secondary | ICD-10-CM

## 2013-11-12 DIAGNOSIS — Z8751 Personal history of pre-term labor: Secondary | ICD-10-CM | POA: Insufficient documentation

## 2013-11-12 MED ORDER — ONDANSETRON 4 MG PO TBDP
4.0000 mg | ORAL_TABLET | Freq: Once | ORAL | Status: AC
  Administered 2013-11-12: 4 mg via ORAL
  Filled 2013-11-12: qty 1

## 2013-11-12 MED ORDER — ONDANSETRON HCL 4 MG/2ML IJ SOLN: 4.0000 mg | Freq: Once | INTRAMUSCULAR | Status: DC

## 2013-11-12 MED ORDER — KETOROLAC TROMETHAMINE 60 MG/2ML IM SOLN
60.0000 mg | Freq: Once | INTRAMUSCULAR | Status: AC
  Administered 2013-11-12: 60 mg via INTRAMUSCULAR
  Filled 2013-11-12: qty 2

## 2013-11-12 MED ORDER — DIAZEPAM 5 MG PO TABS
5.0000 mg | ORAL_TABLET | Freq: Once | ORAL | Status: AC
  Administered 2013-11-12: 5 mg via ORAL
  Filled 2013-11-12: qty 1

## 2013-11-12 MED ORDER — HYDROMORPHONE HCL PF 1 MG/ML IJ SOLN
0.5000 mg | Freq: Once | INTRAMUSCULAR | Status: DC | PRN
  Filled 2013-11-12: qty 1

## 2013-11-12 MED ORDER — DIAZEPAM 2 MG PO TABS: 2.0000 mg | ORAL_TABLET | Freq: Every day | ORAL | Status: DC

## 2013-11-12 MED ORDER — MELOXICAM 7.5 MG PO TABS: 15.0000 mg | ORAL_TABLET | Freq: Every day | ORAL | Status: DC

## 2013-11-12 NOTE — ED Notes (Signed)
Per EMS: Onset back pain yesterday morning, pt says job requires her to stand a lot. Pt denies any twisting, lifting, or trauma to back. Pain progressively worsened throughout the day. Pt ambulatory with assistance.

## 2013-11-12 NOTE — ED Notes (Signed)
Bed: WA05 Expected date:  Expected time:  Means of arrival:  Comments: 

## 2013-11-12 NOTE — ED Provider Notes (Signed)
CSN: [id number]     Arrival date & time 11/12/13  0026 History   First MD Initiated Contact with Patient 11/12/13 0544     Chief Complaint  Patient presents with  . Back Pain   (Consider location/radiation/quality/duration/timing/severity/associated sxs/prior Treatment) HPI Comments: Patient is a 32 year old female with a history of scoliosis and back pain who presents today for back pain with onset yesterday morning. Patient states that symptoms began after she awoke from a nap. She states that earlier in the day she had gone to her chiropractor for readjustment. Patient states she has had pain like this in the past. She has not taken anything for her symptoms. Pain is worse with ambulation and standing. She denies any direct trauma or injury to her back, numbness/tingling, bowel or bladder incontinence, saddle anesthesia, urinary symptoms, fever, and abdominal pain. Patient denies a history of cancer or IV drug use.  Patient is a 32 y.o. female presenting with back pain. The history is provided by the patient. No language interpreter was used.  Back Pain   Past Medical History  Diagnosis Date  . Preterm labor     c/s at 36 wks  . Placenta previa     hx and with current pregnancy  . Migraines     last one over 1 month ago  . Anxiety     no meds  . Depression     no meds  . Asthma   . ADHD (attention deficit hyperactivity disorder)   . Scoliosis    Past Surgical History  Procedure Laterality Date  . Cesarean section  2010    x 1 at 36 wks in [place]  . Svd  2003    x 1 in [place]  . Wisdom tooth extraction    . Tubal ligation     Family History  Problem Relation Age of Onset  . Cancer Mother   . Hypertension Sister   . Sickle cell trait Daughter   . Asthma Son   . Asthma Paternal Grandmother   . Pseudochol deficiency Neg Hx   . Malignant hyperthermia Neg Hx   . Anesthesia problems Neg Hx   . Hypotension Neg Hx    History  Substance Use Topics  . Smoking status:  Current Every Day Smoker -- 0.20 packs/day for 12 years    Types: Cigarettes  . Smokeless tobacco: Never Used  . Alcohol Use: No     Comment: PReviously drank on occ   OB History   Grav Para Term Preterm Abortions TAB SAB Ect Mult Living   3 3 1 2  0 0 0 0 0 3     Review of Systems  Musculoskeletal: Positive for back pain.  All other systems reviewed and are negative.    Allergies  Review of patient's allergies indicates no known allergies.  Home Medications   Current Outpatient Rx  Name  Route  Sig  Dispense  Refill  . levonorgestrel (MIRENA) 20 MCG/24HR IUD   Intrauterine   1 each by Intrauterine route continuous.          . diazepam (VALIUM) 2 MG tablet   Oral   Take 1 tablet (2 mg total) by mouth at bedtime.   12 tablet   0   . meloxicam (MOBIC) 7.5 MG tablet   Oral   Take 2 tablets (15 mg total) by mouth daily.   30 tablet   0    BP 125/79  Pulse 89  Temp(Src) 99.1 F (37.3 C) (  Oral)  Resp 15  Ht 5\' 3"  (1.6 m)  Wt 186 lb (84.369 kg)  BMI 32.96 kg/m2  SpO2 98%  LMP 10/25/2013  Physical Exam  Nursing note and vitals reviewed. Constitutional: She is oriented to person, place, and time. She appears well-developed and well-nourished. No distress.  HENT:  Head: Normocephalic and atraumatic.  Eyes: Conjunctivae and EOM are normal. No scleral icterus.  Neck: Normal range of motion.  Cardiovascular: Normal rate, regular rhythm and intact distal pulses.   Distal radial, dorsalis pedis, and posterior tibial pulses 2+ bilaterally  Pulmonary/Chest: Effort normal. No respiratory distress.  Musculoskeletal: She exhibits tenderness.  Decreased range of motion of the back secondary to discomfort. Patient with diffuse tenderness to bilateral thoracic and lumbosacral paraspinal muscles. No bony deformities or step-offs palpated.  Neurological: She is alert and oriented to person, place, and time. She has normal reflexes.  No gross sensory deficits appreciated.  Patient is weightbearing and ambulatory. Achilles and patellar reflexes 2+ bilaterally.  Skin: Skin is warm and dry. No rash noted. She is not diaphoretic. No erythema. No pallor.  Psychiatric: She has a normal mood and affect. Her behavior is normal.    ED Course  Procedures (including critical care time) Labs Review Labs Reviewed - No data to display Imaging Review No results found.  EKG Interpretation   None       MDM   1. Back pain    Uncomplicated back pain. Patient as well and nontoxic appearing, hemodynamically stable, and afebrile. She is neurovascularly intact on physical exam. Patient ambulatory and weightbearing in ED. DTRs normal and symmetric; no gross sensory deficits appreciated. No red flags or signs concerning for cauda equina. No hx of CA or IVDU. Patient treated in ED with Toradol and Valium. She is stable for discharge with prescriptions for Mobic and Valium as needed. Return precautions discussed and patient agreeable to plan with no unaddressed concerns.    Antony Madura, PA-C 11/17/13 2221

## 2013-11-18 NOTE — ED Provider Notes (Signed)
Medical screening examination/treatment/procedure(s) were performed by non-physician practitioner and as supervising physician I was immediately available for consultation/collaboration.    Teressa Lower, MD 11/18/13 [phone]

## 2013-11-21 ENCOUNTER — Encounter (HOSPITAL_COMMUNITY): Payer: Self-pay | Admitting: Emergency Medicine

## 2013-11-21 ENCOUNTER — Emergency Department (HOSPITAL_COMMUNITY)
Admission: EM | Admit: 2013-11-21 | Discharge: 2013-11-21 | Disposition: A | Discharge status: Home / Self Care | Payer: Medicaid Other | Attending: Emergency Medicine | Admitting: Emergency Medicine

## 2013-11-21 DIAGNOSIS — J45909 Unspecified asthma, uncomplicated: Secondary | ICD-10-CM | POA: Insufficient documentation

## 2013-11-21 DIAGNOSIS — Z8679 Personal history of other diseases of the circulatory system: Secondary | ICD-10-CM | POA: Insufficient documentation

## 2013-11-21 DIAGNOSIS — Z8659 Personal history of other mental and behavioral disorders: Secondary | ICD-10-CM | POA: Insufficient documentation

## 2013-11-21 DIAGNOSIS — B3731 Acute candidiasis of vulva and vagina: Secondary | ICD-10-CM

## 2013-11-21 DIAGNOSIS — B373 Candidiasis of vulva and vagina: Secondary | ICD-10-CM

## 2013-11-21 DIAGNOSIS — L03114 Cellulitis of left upper limb: Secondary | ICD-10-CM

## 2013-11-21 DIAGNOSIS — F17200 Nicotine dependence, unspecified, uncomplicated: Secondary | ICD-10-CM | POA: Insufficient documentation

## 2013-11-21 DIAGNOSIS — N390 Urinary tract infection, site not specified: Secondary | ICD-10-CM

## 2013-11-21 DIAGNOSIS — IMO0002 Reserved for concepts with insufficient information to code with codable children: Secondary | ICD-10-CM | POA: Insufficient documentation

## 2013-11-21 DIAGNOSIS — Z3202 Encounter for pregnancy test, result negative: Secondary | ICD-10-CM | POA: Insufficient documentation

## 2013-11-21 DIAGNOSIS — R21 Rash and other nonspecific skin eruption: Secondary | ICD-10-CM | POA: Insufficient documentation

## 2013-11-21 LAB — URINALYSIS, ROUTINE W REFLEX MICROSCOPIC
Bilirubin Urine: NEGATIVE
Glucose, UA: NEGATIVE mg/dL
Ketones, ur: NEGATIVE mg/dL
Nitrite: NEGATIVE
Protein, ur: NEGATIVE mg/dL
Specific Gravity, Urine: 1.027 (ref 1.005–1.030)
Urobilinogen, UA: 1 mg/dL (ref 0.0–1.0)
pH: 6.5 (ref 5.0–8.0)

## 2013-11-21 LAB — URINE MICROSCOPIC-ADD ON

## 2013-11-21 LAB — WET PREP, GENITAL
Clue Cells Wet Prep HPF POC: NONE SEEN
Trich, Wet Prep: NONE SEEN

## 2013-11-21 LAB — POCT PREGNANCY, URINE: Preg Test, Ur: NEGATIVE

## 2013-11-21 MED ORDER — DIPHENHYDRAMINE HCL 25 MG PO CAPS
25.0000 mg | ORAL_CAPSULE | Freq: Once | ORAL | Status: AC
Start: 2013-11-21 — End: 2013-11-21
  Administered 2013-11-21: 25 mg via ORAL
  Filled 2013-11-21: qty 1

## 2013-11-21 MED ORDER — IBUPROFEN 800 MG PO TABS: 800.0000 mg | ORAL_TABLET | Freq: Three times a day (TID) | ORAL | Status: DC | PRN

## 2013-11-21 MED ORDER — IBUPROFEN 800 MG PO TABS
800.0000 mg | ORAL_TABLET | Freq: Once | ORAL | Status: AC
  Administered 2013-11-21: 800 mg via ORAL
  Filled 2013-11-21 (×2): qty 1

## 2013-11-21 MED ORDER — DIPHENHYDRAMINE-ZINC ACETATE 2-0.1 % EX CREA
TOPICAL_CREAM | Freq: Three times a day (TID) | CUTANEOUS | Status: DC | PRN
  Administered 2013-11-21: 21:00:00 via TOPICAL
  Filled 2013-11-21: qty 28

## 2013-11-21 MED ORDER — FLUCONAZOLE 200 MG PO TABS: 200.0000 mg | ORAL_TABLET | Freq: Every day | ORAL | Status: AC

## 2013-11-21 MED ORDER — CLOTRIMAZOLE 1 % VA CREA: 1.0000 | TOPICAL_CREAM | Freq: Every day | VAGINAL | Status: DC

## 2013-11-21 MED ORDER — SULFAMETHOXAZOLE-TMP DS 800-160 MG PO TABS
1.0000 | ORAL_TABLET | Freq: Once | ORAL | Status: AC
  Administered 2013-11-21: 1 via ORAL
  Filled 2013-11-21 (×2): qty 1

## 2013-11-21 MED ORDER — SULFAMETHOXAZOLE-TMP DS 800-160 MG PO TABS: 1.0000 | ORAL_TABLET | Freq: Two times a day (BID) | ORAL | Status: DC

## 2013-11-21 NOTE — ED Notes (Signed)
Pt reports pain, increased  swelling and increased redness in l/arm increasing over the last three days. Rash -like appearance on l/forearm. Pt reports vaginal itching and burning x 2 days

## 2013-11-21 NOTE — Discharge Instructions (Signed)
Candidal Vulvovaginitis Candidal vulvovaginitis is an infection of the vagina and vulva. The vulva is the skin around the opening of the vagina. This may cause itching and discomfort in and around the vagina.  HOME CARE  Only take medicine as told by your doctor.  Do not have sex (intercourse) until the infection is healed or as told by your doctor.  Practice safe sex.  Tell your sex partner about your infection.  Do not douche or use tampons.  Wear cotton underwear. Do not wear tight pants or panty hose.  Eat yogurt. This may help treat and prevent yeast infections. GET HELP RIGHT AWAY IF:   You have a fever.  Your problems get worse during treatment or do not get better in 3 days.  You have discomfort, irritation, or itching in your vagina or vulva area.  You have pain after sex.  You start to get belly (abdominal) pain. MAKE SURE YOU:  Understand these instructions.  Will watch your condition.  Will get help right away if you are not doing well or get worse. Document Released: 01/27/2009 Document Revised: 01/23/2012 Document Reviewed: 01/27/2009 [facility] Patient Information 2014 [place], [place].  Cellulitis Cellulitis is an infection of the skin and the tissue beneath it. The infected area is usually red and tender. Cellulitis occurs most often in the arms and lower legs.  CAUSES  Cellulitis is caused by bacteria that enter the skin through cracks or cuts in the skin. The most common types of bacteria that cause cellulitis are Staphylococcus and Streptococcus. SYMPTOMS   Redness and warmth.  Swelling.  Tenderness or pain.  Fever. DIAGNOSIS  Your caregiver can usually determine what is wrong based on a physical exam. Blood tests may also be done. TREATMENT  Treatment usually involves taking an antibiotic medicine. HOME CARE INSTRUCTIONS   Take your antibiotics as directed. Finish them even if you start to feel better.  Keep the infected arm or leg elevated  to reduce swelling.  Apply a warm cloth to the affected area up to 4 times per day to relieve pain.  Only take over-the-counter or prescription medicines for pain, discomfort, or fever as directed by your caregiver.  Keep all follow-up appointments as directed by your caregiver. SEEK MEDICAL CARE IF:   You notice red streaks coming from the infected area.  Your red area gets larger or turns dark in color.  Your bone or joint underneath the infected area becomes painful after the skin has healed.  Your infection returns in the same area or another area.  You notice a swollen bump in the infected area.  You develop new symptoms. SEEK IMMEDIATE MEDICAL CARE IF:   You have a fever.  You feel very sleepy.  You develop vomiting or diarrhea.  You have a general ill feeling (malaise) with muscle aches and pains. MAKE SURE YOU:   Understand these instructions.  Will watch your condition.  Will get help right away if you are not doing well or get worse. Document Released: 08/10/2005 Document Revised: 05/01/2012 Document Reviewed: 01/16/2012 [facility] Patient Information 2014 [place].  Community-Associated MRSA CA-MRSA stands for community-associated methicillin-resistant Staphylococcus aureus. MRSA is a type of bacteria that is resistant to some common antibiotics. It can cause infections in the skin and many other places in the body. Staphylococcus aureus, often called "staph," is a bacteria that normally lives on the skin or in the nose. Staph on the surface of the skin or in the nose does not cause problems.  However, if the staph enters the body through a cut, wound, or break in the skin, an infection can happen. Up until recently, infections with the MRSA type of staph mainly occurred in hospitals and other healthcare settings. There are now increasing problems with MRSA infections in the community as well. Infections with MRSA may be very serious or even  life-threatening. CA-MRSA is becoming more common. It is known to spread in crowded settings, in jails and prisons, and in situations where there is close skin-to-skin contact, such as during sporting events or in locker rooms. MRSA can be spread through shared items, such as children's toys, razors, towels, or sports equipment.  CAUSES All staph, including MRSA, are normally harmless unless they enter the body through a scratch, cut, or wound, such as with surgery. All staph, including MRSA, can be spread from person-to-person by touching contaminated objects or through direct contact.  MRSA now causes illness in people who have not been in hospitals or other healthcare facilities. Cases of MRSA diseases in the community have been associated with:  Recent antibiotic use.  Sharing contaminated towels or clothes.  Having active skin diseases.  Participating in contact sports.  Living in crowded settings.  Intravenous (IV) drug use.  Community-associated MRSA infections are usually skin infections, but may cause other severe illnesses.  Staph bacteria are one of the most common causes of skin infection. However, they are also a common cause of pneumonia, bone or joint infections, and bloodstream infections. DIAGNOSIS Diagnosis of MRSA is done by cultures of fluid samples that may come from:  Swabs taken from cuts or wounds in infected areas.  Nasal swabs.  Saliva or deep cough specimens from the lungs (sputum).  Urine.  Blood. Many people are "colonized" with MRSA but have no signs of infection. This means that people carry the MRSA germ on their skin or in their nose and may never develop MRSA infection.  TREATMENT  Treatment varies and is based on how serious, how deep, or how extensive the infection is. For example:  Some skin infections, such as a small boil or abscess, may be treated by draining yellowish-white fluid (pus) from the site of the infection.  Deeper or more  widespread soft tissue infections are usually treated with surgery to drain pus and with antibiotic medicine given by vein or by mouth. This may be recommended even if you are pregnant.  Serious infections may require a hospital stay. If antibiotics are given, they may be needed for several weeks. PREVENTION Because many people are colonized with staph, including MRSA, preventing the spread of the bacteria from person-to-person is most important. The best way to prevent the spread of bacteria and other germs is through proper hand washing or by using alcohol-based hand disinfectants. The following are other ways to help prevent MRSA infection within community settings.   Wash your hands frequently with soap and water for at least 15 seconds. Otherwise, use alcohol-based hand disinfectants when soap and water is not available.  Make sure people who live with you wash their hands often, too.  Do not share personal items. For example, avoid sharing razors and other personal hygiene items, towels, clothing, and athletic equipment.  Wash and dry your clothes and bedding at the warmest temperatures recommended on the labels.  Keep wounds covered. Pus from infected sores may contain MRSA and other bacteria. Keep cuts and abrasions clean and covered with germ-free (sterile), dry bandages until they are healed.  If you have a  wound that appears infected, ask your caregiver if a culture for MRSA and other bacteria should be done.  If you are breastfeeding, talk to your caregiver about MRSA. You may be asked to temporarily stop breastfeeding. HOME CARE INSTRUCTIONS   Take your antibiotics as directed. Finish them even if you start to feel better.  Avoid close contact with those around you as much as possible. Do not use towels, razors, toothbrushes, bedding, or other items that will be used by others.  To fight the infection, follow your caregiver's instructions for wound care. Wash your hands before  and after changing your bandages.  If you have an intravascular device, such as a catheter, make sure you know how to care for it.  Be sure to tell any healthcare providers that you have MRSA so they are aware of your infection. SEEK IMMEDIATE MEDICAL CARE IF:  The infection appears to be getting worse. Signs include:  Increased warmth, redness, or tenderness around the wound site.  A red line that extends from the infection site.  A dark color in the area around the infection.  Wound drainage that is tan, yellow, or green.  A bad smell coming from the wound.  You feel sick to your stomach (nauseous) and throw up (vomit) or cannot keep medicine down.  You have a fever.  Your baby is older than 3 months with a rectal temperature of 102 F (38.9 C) or higher.  Your baby is 77 months old or younger with a rectal temperature of 100.4 F (38 C) or higher.  You have difficulty breathing. MAKE SURE YOU:   Understand these instructions.  Will watch your condition.  Will get help right away if you are not doing well or get worse. Document Released: 02/03/2006 Document Revised: 01/23/2012 Document Reviewed: 02/03/2011 [facility] Patient Information 2014 [place].  MRSA Overview MRSA stands for methicillin-resistant Staphylococcus aureus. It is a type of bacteria that is resistant to some common antibiotics. It can cause infections in the skin and many other places in the body. Staphylococcus aureus, often called "staph," is a bacteria that normally lives on the skin or in the nose. Staph on the surface of the skin or in the nose does not cause problems. However, if the staph enters the body through a cut, wound, or break in the skin, an infection can happen. Up until recently, infections with the MRSA type of staph mainly occurred in hospitals and other healthcare settings. There are now increasing problems with MRSA infections in the community as well. Infections with MRSA may be  very serious or even life-threatening. Most MRSA infections are acquired in one of two ways:  Healthcare-associated MRSA (HA-MRSA)  This can be acquired by people in any healthcare setting. MRSA can be a big problem for hospitalized people, people in nursing homes, people in rehabilitation facilities, people with weakened immune systems, dialysis patients, and those who have had surgery.  Community-associated MRSA (CA-MRSA)  Community spread of MRSA is becoming more common. It is known to spread in crowded settings, in jails and prisons, and in situations where there is close skin-to-skin contact, such as during sporting events or in locker rooms. MRSA can be spread through shared items, such as children's toys, razors, towels, or sports equipment. CAUSES  All staph, including MRSA, are normally harmless unless they enter the body through a scratch, cut, or wound, such as with surgery. All staph, including MRSA, can be spread from person-to-person by touching contaminated objects or through  direct contact. SPECIAL GROUPS MRSA can present problems for special groups of people. Some of these groups include:  Breastfeeding women.  The most common problem is MRSA infection of the breast (mastitis). There is evidence that MRSA can be passed to an infant from infected breast milk. Your caregiver may recommend that you stop breastfeeding until the mastitis is under control.  If you are breastfeeding and have a MRSA infection in a place other than the breast, you may usually continue breastfeeding while under treatment. If taking antibiotics, ask your caregiver if it is safe to continue breastfeeding while taking your prescribed medicines.  Neonates (babies from birth to 65 month old) and infants (babies from 19 month to 33 year old).  There is evidence that MRSA can be passed to a newborn at birth if the mother has MRSA on the skin, in or around the birth canal, or an infection in the uterus, cervix, or  vagina. MRSA infection can have the same appearance as a normal newborn or infant rash or several other skin infections. This can make it hard to diagnose MRSA.  Immune compromised people.  If you have an immune system problem, you may have a higher chance of developing a MRSA infection.  People after any type of surgery.  Staph in general, including MRSA, is the most common cause of infections occurring at the site of recent surgery.  People on long-term steroid medicines.  These kinds of medicines can lower your resistance to infection. This can increase your chance of getting MRSA.  People who have had frequent hospitalizations, live in nursing homes or other residential care facilities, have venous or urinary catheters, or have taken multiple courses of antibiotic therapy for any reason. DIAGNOSIS  Diagnosis of MRSA is done by cultures of fluid samples that may come from:  Swabs taken from cuts or wounds in infected areas.  Nasal swabs.  Saliva or deep cough specimens from the lungs (sputum).  Urine.  Blood. Many people are "colonized" with MRSA but have no signs of infection. This means that people carry the MRSA germ on their skin or in their nose and may never develop MRSA infection.  TREATMENT  Treatment varies and is based on how serious, how deep, or how extensive the infection is. For example:  Some skin infections, such as a small boil or abscess, may be treated by draining yellowish-white fluid (pus) from the site of the infection.  Deeper or more widespread soft tissue infections are usually treated with surgery to drain pus and with antibiotic medicine given by vein or by mouth. This may be recommended even if you are pregnant.  Serious infections may require a hospital stay. If antibiotics are given, they may be needed for several weeks. PREVENTION  Because many people are colonized with staph, including MRSA, preventing the spread of the bacteria from  person-to-person is most important. The best way to prevent the spread of bacteria and other germs is through proper hand washing or by using alcohol-based hand disinfectants. The following are other ways to help prevent MRSA infection within the hospital and community settings.   Healthcare settings:  Strict hand washing or hand disinfection procedures need to be followed before and after touching every patient.  Patients infected with MRSA are placed in isolation to prevent the spread of the bacteria.  Healthcare workers need to wear disposable gowns and gloves when touching or caring for patients infected with MRSA. Visitors may also be asked to wear a gown  and gloves.  Hospital surfaces need to be disinfected frequently.  Community settings:  [employer] frequently with soap and water for at least 15 seconds. Otherwise, use alcohol-based hand disinfectants when soap and water is not available.  Make sure people who live with you wash their hands often, too.  Do not share personal items. For example, avoid sharing razors and other personal hygiene items, towels, clothing, and athletic equipment.  Wash and dry your clothes and bedding at the warmest temperatures recommended on the labels.  Keep wounds covered. Pus from infected sores may contain MRSA and other bacteria. Keep cuts and abrasions clean and covered with germ-free (sterile), dry bandages until they are healed.  If you have a wound that appears infected, ask your caregiver if a culture for MRSA and other bacteria should be done.  If you are breastfeeding, talk to your caregiver about MRSA. You may be asked to temporarily stop breastfeeding. HOME CARE INSTRUCTIONS   Take your antibiotics as directed. Finish them even if you start to feel better.  Avoid close contact with those around you as much as possible. Do not use towels, razors, toothbrushes, bedding, or other items that will be used by others.  To fight the  infection, follow your caregiver's instructions for wound care. Wash your hands before and after changing your bandages.  If you have an intravascular device, such as a catheter, make sure you know how to care for it.  Be sure to tell any healthcare providers that you have MRSA so they are aware of your infection. SEEK IMMEDIATE MEDICAL CARE IF:   The infection appears to be getting worse. Signs include:  Increased warmth, redness, or tenderness around the wound site.  A red line that extends from the infection site.  A dark color in the area around the infection.  Wound drainage that is tan, yellow, or green.  A bad smell coming from the wound.  You feel sick to your stomach (nauseous) and throw up (vomit) or cannot keep medicine down.  You have a fever.  Your baby is older than 3 months with a rectal temperature of 102 F (38.9 C) or higher.  Your baby is 66 months old or younger with a rectal temperature of 100.4 F (38 C) or higher.  You have difficulty breathing. MAKE SURE YOU:   Understand these instructions.  Will watch your condition.  Will get help right away if you are not doing well or get worse. Document Released: 10/31/2005 Document Revised: 01/23/2012 Document Reviewed: 02/02/2011 [facility] Patient Information 2014 [place], [place].  Urinary Tract Infection Urinary tract infections (UTIs) can develop anywhere along your urinary tract. Your urinary tract is your body's drainage system for removing wastes and extra water. Your urinary tract includes two kidneys, two ureters, a bladder, and a urethra. Your kidneys are a pair of bean-shaped organs. Each kidney is about the size of your fist. They are located below your ribs, one on each side of your spine. CAUSES Infections are caused by microbes, which are microscopic organisms, including fungi, viruses, and bacteria. These organisms are so small that they can only be seen through a microscope. Bacteria are the  microbes that most commonly cause UTIs. SYMPTOMS  Symptoms of UTIs may vary by age and gender of the patient and by the location of the infection. Symptoms in young women typically include a frequent and intense urge to urinate and a painful, burning feeling in the bladder or urethra during urination. Older women  and men are more likely to be tired, shaky, and weak and have muscle aches and abdominal pain. A fever may mean the infection is in your kidneys. Other symptoms of a kidney infection include pain in your back or sides below the ribs, nausea, and vomiting. DIAGNOSIS To diagnose a UTI, your caregiver will ask you about your symptoms. Your caregiver also will ask to provide a urine sample. The urine sample will be tested for bacteria and white blood cells. White blood cells are made by your body to help fight infection. TREATMENT  Typically, UTIs can be treated with medication. Because most UTIs are caused by a bacterial infection, they usually can be treated with the use of antibiotics. The choice of antibiotic and length of treatment depend on your symptoms and the type of bacteria causing your infection. HOME CARE INSTRUCTIONS  If you were prescribed antibiotics, take them exactly as your caregiver instructs you. Finish the medication even if you feel better after you have only taken some of the medication.  Drink enough water and fluids to keep your urine clear or pale yellow.  Avoid caffeine, tea, and carbonated beverages. They tend to irritate your bladder.  Empty your bladder often. Avoid holding urine for long periods of time.  Empty your bladder before and after sexual intercourse.  After a bowel movement, women should cleanse from front to back. Use each tissue only once. SEEK MEDICAL CARE IF:   You have back pain.  You develop a fever.  Your symptoms do not begin to resolve within 3 days. SEEK IMMEDIATE MEDICAL CARE IF:   You have severe back pain or lower abdominal  pain.  You develop chills.  You have nausea or vomiting.  You have continued burning or discomfort with urination. MAKE SURE YOU:   Understand these instructions.  Will watch your condition.  Will get help right away if you are not doing well or get worse. Document Released: 08/10/2005 Document Revised: 05/01/2012 Document Reviewed: 12/09/2011 [facility] Patient Information 2014 [place].

## 2013-11-21 NOTE — ED Notes (Signed)
Per EMS- Patient has an abscessed area tot he left posterior forearm.

## 2013-11-21 NOTE — ED Provider Notes (Signed)
CSN: [id number]     Arrival date & time 11/21/13  1627 History  This chart was scribed for non-physician practitioner, Wille Glaser, PA-C,working with Blanchie Dessert, MD, by Marlowe Kays, ED Scribe.  This patient was seen in room WTR7/WTR7 and the patient's care was started at 6:07 PM.  Chief Complaint  Patient presents with  . Abscess    swelling and itching on l/arm x 3 days   The history is provided by the patient. No language interpreter was used.   HPI Comments:  Penny [name] is a 33 y.o. female, brought in by ambulance, who presents to the Emergency Department complaining of a painful, itching area on her left arm. Pt states she started feeling an area of itching on her left arm a few days ago and reports associated swelling and redness. Pt states she was scratching and now has an open, scabbed wound on her forearm. Pt also reports a yellowish vaginal discharge that she started to notice yesterday. She reports a burning sensation when she urinates. She reports similar symptoms 3-4 times in the past two months and was treated for yeast infection. She states she has not had sex in over a year. She denies fever and chills.    Past Medical History  Diagnosis Date  . Preterm labor     c/s at 36 wks  . Placenta previa     hx and with current pregnancy  . Migraines     last one over 1 month ago  . Anxiety     no meds  . Depression     no meds  . Asthma   . ADHD (attention deficit hyperactivity disorder)   . Scoliosis    Past Surgical History  Procedure Laterality Date  . Cesarean section  2010    x 1 at 36 wks in [place]  . Svd  2003    x 1 in [place]  . Wisdom tooth extraction    . Tubal ligation     Family History  Problem Relation Age of Onset  . Cancer Mother   . Hypertension Sister   . Sickle cell trait Daughter   . Asthma Son   . Asthma Paternal Grandmother   . Pseudochol deficiency Neg Hx   . Malignant hyperthermia Neg Hx   . Anesthesia problems Neg Hx    . Hypotension Neg Hx    History  Substance Use Topics  . Smoking status: Current Every Day Smoker -- 0.20 packs/day for 12 years    Types: Cigarettes  . Smokeless tobacco: Never Used  . Alcohol Use: No     Comment: PReviously drank on occ   OB History   Grav Para Term Preterm Abortions TAB SAB Ect Mult Living   3 3 1 2  0 0 0 0 0 3     Review of Systems  Constitutional: Negative for fever and chills.  Genitourinary: Positive for vaginal discharge.  Skin: Positive for rash (left forearm).  All other systems reviewed and are negative.    Allergies  Review of patient's allergies indicates no known allergies.  Home Medications   Current Outpatient Rx  Name  Route  Sig  Dispense  Refill  . diazepam (VALIUM) 2 MG tablet   Oral   Take 1 tablet (2 mg total) by mouth at bedtime.   12 tablet   0   . levonorgestrel (MIRENA) 20 MCG/24HR IUD   Intrauterine   1 each by Intrauterine route continuous.          Marland Kitchen  meloxicam (MOBIC) 7.5 MG tablet   Oral   Take 2 tablets (15 mg total) by mouth daily.   30 tablet   0    Triage Vitals: BP 118/74  Pulse 78  Temp(Src) 97.3 F (36.3 C) (Oral)  Resp 18  Wt 186 lb (84.369 kg)  SpO2 97%  LMP 10/25/2013 Physical Exam  Nursing note and vitals reviewed. Constitutional: She is oriented to person, place, and time. She appears well-developed and well-nourished. No distress.  HENT:  Head: Normocephalic and atraumatic.  Right Ear: External ear normal.  Left Ear: External ear normal.  Nose: Nose normal.  Mouth/Throat: Oropharynx is clear and moist. No oropharyngeal exudate.  Eyes: Conjunctivae are normal. Pupils are equal, round, and reactive to light. No scleral icterus.  Neck: Normal range of motion. Neck supple.  Cardiovascular: Normal rate, regular rhythm and normal heart sounds.  Exam reveals no gallop and no friction rub.   No murmur heard. Pulmonary/Chest: Effort normal and breath sounds normal. No respiratory distress. She  has no wheezes. She has no rales. She exhibits no tenderness.  Abdominal: Soft. Bowel sounds are normal. She exhibits no distension. There is no tenderness.  Genitourinary: There is no rash, tenderness or lesion on the right labia. There is no rash, tenderness or lesion on the left labia. Uterus is not enlarged and not tender. Cervix exhibits no motion tenderness, no discharge and no friability. Right adnexum displays no mass and no tenderness. Left adnexum displays no mass and no tenderness. No bleeding around the vagina. No vaginal discharge found.  Musculoskeletal: Normal range of motion. She exhibits edema and tenderness.  Tenderness to palpation of left forearm.  Lymphadenopathy:    She has no cervical adenopathy.  Neurological: She is alert and oriented to person, place, and time. She exhibits normal muscle tone. Coordination normal.  Skin: Skin is warm and dry. Rash noted. There is erythema. No pallor.  Patient with small 0.5 cm area of induration to left wrist and forearm - erythema noted to forearm with streaking up to left elbow  Psychiatric: She has a normal mood and affect. Her behavior is normal. Judgment and thought content normal.    ED Course  Procedures (including critical care time) DIAGNOSTIC STUDIES: Oxygen Saturation is 97% on RA, normal by my interpretation.   COORDINATION OF CARE: 6:13 PM- Will perform pelvic exam. Will start antibiotic and give Ibuprofen for pain. Pt verbalizes understanding and agrees to plan.  Medications  sulfamethoxazole-trimethoprim (BACTRIM DS) 800-160 MG per tablet 1 tablet (1 tablet Oral Given 11/21/13 1903)  ibuprofen (ADVIL,MOTRIN) tablet 800 mg (800 mg Oral Given 11/21/13 1903)   Labs Review Labs Reviewed  URINALYSIS, ROUTINE W REFLEX MICROSCOPIC - Abnormal; Notable for the following:    Color, Urine AMBER (*)    APPearance CLOUDY (*)    Hgb urine dipstick TRACE (*)    Leukocytes, UA MODERATE (*)    All other components within normal  limits  GC/CHLAMYDIA PROBE AMP  WET PREP, GENITAL  URINE MICROSCOPIC-ADD ON  POCT PREGNANCY, URINE   Imaging Review No results found.  EKG Interpretation   None      Results for orders placed during the hospital encounter of 11/21/13  GC/CHLAMYDIA PROBE AMP      Result Value Range   CT Probe RNA NEGATIVE  NEGATIVE   GC Probe RNA NEGATIVE  NEGATIVE  WET PREP, GENITAL      Result Value Range   Yeast Wet Prep HPF POC FEW (*) NONE  SEEN   Trich, Wet Prep NONE SEEN  NONE SEEN   Clue Cells Wet Prep HPF POC NONE SEEN  NONE SEEN   WBC, Wet Prep HPF POC MANY (*) NONE SEEN  URINALYSIS, ROUTINE W REFLEX MICROSCOPIC      Result Value Range   Color, Urine AMBER (*) YELLOW   APPearance CLOUDY (*) CLEAR   Specific Gravity, Urine 1.027  1.005 - 1.030   pH 6.5  5.0 - 8.0   Glucose, UA NEGATIVE  NEGATIVE mg/dL   Hgb urine dipstick TRACE (*) NEGATIVE   Bilirubin Urine NEGATIVE  NEGATIVE   Ketones, ur NEGATIVE  NEGATIVE mg/dL   Protein, ur NEGATIVE  NEGATIVE mg/dL   Urobilinogen, UA 1.0  0.0 - 1.0 mg/dL   Nitrite NEGATIVE  NEGATIVE   Leukocytes, UA MODERATE (*) NEGATIVE  URINE MICROSCOPIC-ADD ON      Result Value Range   Squamous Epithelial / LPF RARE  RARE   WBC, UA 11-20  <3 WBC/hpf   RBC / HPF 0-2  <3 RBC/hpf   Bacteria, UA RARE  RARE  POCT PREGNANCY, URINE      Result Value Range   Preg Test, Ur NEGATIVE  NEGATIVE   Ct Head Wo Contrast  10/27/2013   CLINICAL DATA:  Headache with dizziness.  EXAM: CT HEAD WITHOUT CONTRAST  TECHNIQUE: Contiguous axial images were obtained from the base of the skull through the vertex without contrast.  COMPARISON:  None  FINDINGS: Normal appearance of the intracranial structures. No evidence for acute hemorrhage, mass lesion, midline shift, hydrocephalus or large infarct. No acute bony abnormality. The visualized sinuses are clear.  IMPRESSION: No acute intracranial abnormality.   Electronically Signed   By: Rolla Flatten M.D.   On: 10/27/2013 01:57      MDM  UTI Cellulitis  Patient here with complaints of left arm pain and redness with streaking, likely cellulitis - area is indurated without fluctuance, no evidence of abscess now, but patient made aware this could evolve to abscess.  Also noted with UTI, bactrim should cover both.    I personally performed the services described in this documentation, which was scribed in my presence. The recorded information has been reviewed and is accurate.    Idalia Needle Joelyn Oms, PA-C 11/23/13 1625

## 2013-11-21 NOTE — ED Provider Notes (Cosign Needed)
CSN: [id number]     Arrival date & time 11/21/13  1627 History   First MD Initiated Contact with Patient 11/21/13 1753     Chief Complaint  Patient presents with  . Abscess    swelling and itching on l/arm x 3 days   (Consider location/radiation/quality/duration/timing/severity/associated sxs/prior Treatment) HPI  Past Medical History  Diagnosis Date  . Preterm labor     c/s at 36 wks  . Placenta previa     hx and with current pregnancy  . Migraines     last one over 1 month ago  . Anxiety     no meds  . Depression     no meds  . Asthma   . ADHD (attention deficit hyperactivity disorder)   . Scoliosis    Past Surgical History  Procedure Laterality Date  . Cesarean section  2010    x 1 at 36 wks in [place]  . Svd  2003    x 1 in [place]  . Wisdom tooth extraction    . Tubal ligation     Family History  Problem Relation Age of Onset  . Cancer Mother   . Hypertension Sister   . Sickle cell trait Daughter   . Asthma Son   . Asthma Paternal Grandmother   . Pseudochol deficiency Neg Hx   . Malignant hyperthermia Neg Hx   . Anesthesia problems Neg Hx   . Hypotension Neg Hx    History  Substance Use Topics  . Smoking status: Current Every Day Smoker -- 0.20 packs/day for 12 years    Types: Cigarettes  . Smokeless tobacco: Never Used  . Alcohol Use: No     Comment: PReviously drank on occ   OB History   Grav Para Term Preterm Abortions TAB SAB Ect Mult Living   3 3 1 2  0 0 0 0 0 3     Review of Systems  Allergies  Review of patient's allergies indicates no known allergies.  Home Medications   Current Outpatient Rx  Name  Route  Sig  Dispense  Refill  . meloxicam (MOBIC) 7.5 MG tablet   Oral   Take 15 mg by mouth daily.         Marland Kitchen levonorgestrel (MIRENA) 20 MCG/24HR IUD   Intrauterine   1 each by Intrauterine route continuous.           BP 118/74  Pulse 78  Temp(Src) 97.3 F (36.3 C) (Oral)  Resp 18  Wt 186 lb (84.369 kg)  SpO2 97%  LMP  10/25/2013 Physical Exam  Nursing note and vitals reviewed. Constitutional: She is oriented to person, place, and time. She appears well-developed and well-nourished. No distress.  HENT:  Head: Normocephalic and atraumatic.  Mouth/Throat: Oropharynx is clear and moist.  Eyes: Conjunctivae are normal. No scleral icterus.  Neck: Normal range of motion. Neck supple.  Pulmonary/Chest: Effort normal.  Abdominal: Soft. Bowel sounds are normal. She exhibits no distension. There is no tenderness. There is no rebound and no guarding.  Genitourinary: There is no rash, tenderness or lesion on the right labia. There is no rash, tenderness or lesion on the left labia. Uterus is not enlarged and not tender. Cervix exhibits no motion tenderness, no discharge and no friability. Right adnexum displays no mass and no tenderness. Left adnexum displays no mass and no tenderness. No tenderness or bleeding around the vagina. No vaginal discharge found.  Musculoskeletal: Normal range of motion. She exhibits no edema and no  tenderness.  Lymphadenopathy:    She has no cervical adenopathy.  Neurological: She is alert and oriented to person, place, and time. She exhibits normal muscle tone. Coordination normal.  Skin: Skin is warm and dry. Rash noted. There is erythema.  Two small papules to left forearm, one at wrist, induration without fluctuance, one in mid left dorsal forearm with erythema and induration.  Generalized erythema to fore arm with streaking to volar surface of forearm and up to left elbow.  Psychiatric: She has a normal mood and affect. Her behavior is normal. Judgment and thought content normal.    ED Course  Procedures (including critical care time) Labs Review Labs Reviewed  WET PREP, GENITAL - Abnormal; Notable for the following:    Yeast Wet Prep HPF POC FEW (*)    WBC, Wet Prep HPF POC MANY (*)    All other components within normal limits  URINALYSIS, ROUTINE W REFLEX MICROSCOPIC - Abnormal;  Notable for the following:    Color, Urine AMBER (*)    APPearance CLOUDY (*)    Hgb urine dipstick TRACE (*)    Leukocytes, UA MODERATE (*)    All other components within normal limits  GC/CHLAMYDIA PROBE AMP  URINE MICROSCOPIC-ADD ON  POCT PREGNANCY, URINE   Imaging Review No results found.  EKG Interpretation   None      Results for orders placed during the hospital encounter of 11/21/13  WET PREP, GENITAL      Result Value Range   Yeast Wet Prep HPF POC FEW (*) NONE SEEN   Trich, Wet Prep NONE SEEN  NONE SEEN   Clue Cells Wet Prep HPF POC NONE SEEN  NONE SEEN   WBC, Wet Prep HPF POC MANY (*) NONE SEEN  URINALYSIS, ROUTINE W REFLEX MICROSCOPIC      Result Value Range   Color, Urine AMBER (*) YELLOW   APPearance CLOUDY (*) CLEAR   Specific Gravity, Urine 1.027  1.005 - 1.030   pH 6.5  5.0 - 8.0   Glucose, UA NEGATIVE  NEGATIVE mg/dL   Hgb urine dipstick TRACE (*) NEGATIVE   Bilirubin Urine NEGATIVE  NEGATIVE   Ketones, ur NEGATIVE  NEGATIVE mg/dL   Protein, ur NEGATIVE  NEGATIVE mg/dL   Urobilinogen, UA 1.0  0.0 - 1.0 mg/dL   Nitrite NEGATIVE  NEGATIVE   Leukocytes, UA MODERATE (*) NEGATIVE  URINE MICROSCOPIC-ADD ON      Result Value Range   Squamous Epithelial / LPF RARE  RARE   WBC, UA 11-20  <3 WBC/hpf   RBC / HPF 0-2  <3 RBC/hpf   Bacteria, UA RARE  RARE  POCT PREGNANCY, URINE      Result Value Range   Preg Test, Ur NEGATIVE  NEGATIVE   Ct Head Wo Contrast  10/27/2013   CLINICAL DATA:  Headache with dizziness.  EXAM: CT HEAD WITHOUT CONTRAST  TECHNIQUE: Contiguous axial images were obtained from the base of the skull through the vertex without contrast.  COMPARISON:  None  FINDINGS: Normal appearance of the intracranial structures. No evidence for acute hemorrhage, mass lesion, midline shift, hydrocephalus or large infarct. No acute bony abnormality. The visualized sinuses are clear.  IMPRESSION: No acute intracranial abnormality.   Electronically Signed   By:  Rolla Flatten M.D.   On: 10/27/2013 01:57     MDM  UTI Cellulitis of left forearm Candidal vaginitis  Patient here with 9th visit in the last 6 months.  She is here with  multiple complaints, likely cellulitis to left forearm, no evidence of abscess formation at this time but I have explained to the patient that these may progress to form an abscess but were too early to be I&D'd.  Also with UTI and yeast infection.  Will treat the cellulitis for possible MRSA with bactrim which will cover the UTI as well.  Will prescribe a diflucan for after she finishes the abx.  She will use the nystatin cream until then.    Idalia Needle Joelyn Oms, PA-C 11/21/13 2016

## 2013-11-22 LAB — GC/CHLAMYDIA PROBE AMP
CT Probe RNA: NEGATIVE
GC Probe RNA: NEGATIVE

## 2013-11-25 NOTE — ED Provider Notes (Signed)
Medical screening examination/treatment/procedure(s) were performed by non-physician practitioner and as supervising physician I was immediately available for consultation/collaboration.  EKG Interpretation   None         Blanchie Dessert, MD 11/25/13 0710

## 2013-12-05 ENCOUNTER — Emergency Department (HOSPITAL_COMMUNITY)
Admission: EM | Admit: 2013-12-05 | Discharge: 2013-12-06 | Disposition: A | Discharge status: Home / Self Care | Payer: Medicaid Other | Attending: Emergency Medicine | Admitting: Emergency Medicine

## 2013-12-05 ENCOUNTER — Encounter (HOSPITAL_COMMUNITY): Payer: Self-pay | Admitting: Emergency Medicine

## 2013-12-05 DIAGNOSIS — Z8679 Personal history of other diseases of the circulatory system: Secondary | ICD-10-CM | POA: Insufficient documentation

## 2013-12-05 DIAGNOSIS — M545 Low back pain, unspecified: Secondary | ICD-10-CM | POA: Insufficient documentation

## 2013-12-05 DIAGNOSIS — Z8742 Personal history of other diseases of the female genital tract: Secondary | ICD-10-CM | POA: Insufficient documentation

## 2013-12-05 DIAGNOSIS — J45909 Unspecified asthma, uncomplicated: Secondary | ICD-10-CM | POA: Insufficient documentation

## 2013-12-05 DIAGNOSIS — F17200 Nicotine dependence, unspecified, uncomplicated: Secondary | ICD-10-CM | POA: Insufficient documentation

## 2013-12-05 DIAGNOSIS — H9209 Otalgia, unspecified ear: Secondary | ICD-10-CM | POA: Insufficient documentation

## 2013-12-05 DIAGNOSIS — M546 Pain in thoracic spine: Secondary | ICD-10-CM | POA: Insufficient documentation

## 2013-12-05 DIAGNOSIS — J111 Influenza due to unidentified influenza virus with other respiratory manifestations: Secondary | ICD-10-CM | POA: Insufficient documentation

## 2013-12-05 DIAGNOSIS — Z8659 Personal history of other mental and behavioral disorders: Secondary | ICD-10-CM | POA: Insufficient documentation

## 2013-12-05 DIAGNOSIS — R1110 Vomiting, unspecified: Secondary | ICD-10-CM | POA: Insufficient documentation

## 2013-12-05 DIAGNOSIS — J329 Chronic sinusitis, unspecified: Secondary | ICD-10-CM

## 2013-12-05 DIAGNOSIS — R69 Illness, unspecified: Secondary | ICD-10-CM

## 2013-12-05 DIAGNOSIS — M542 Cervicalgia: Secondary | ICD-10-CM | POA: Insufficient documentation

## 2013-12-05 NOTE — ED Notes (Signed)
Pt to ER via EMS for complaints of sore throat, ear pain and congestion

## 2013-12-06 ENCOUNTER — Emergency Department (HOSPITAL_COMMUNITY): Payer: Medicaid Other

## 2013-12-06 MED ORDER — MOMETASONE FUROATE 50 MCG/ACT NA SUSP: 2.0000 | Freq: Every day | NASAL | Status: DC

## 2013-12-06 MED ORDER — IBUPROFEN 800 MG PO TABS
800.0000 mg | ORAL_TABLET | Freq: Once | ORAL | Status: AC
  Administered 2013-12-06: 800 mg via ORAL
  Filled 2013-12-06: qty 1

## 2013-12-06 MED ORDER — OSELTAMIVIR PHOSPHATE 75 MG PO CAPS
75.0000 mg | ORAL_CAPSULE | Freq: Once | ORAL | Status: AC
  Administered 2013-12-06: 75 mg via ORAL
  Filled 2013-12-06: qty 1

## 2013-12-06 MED ORDER — OXYMETAZOLINE HCL 0.05 % NA SOLN: 1.0000 | Freq: Two times a day (BID) | NASAL | Status: DC

## 2013-12-06 MED ORDER — BENZONATATE 100 MG PO CAPS: 100.0000 mg | ORAL_CAPSULE | Freq: Three times a day (TID) | ORAL | Status: DC

## 2013-12-06 MED ORDER — OSELTAMIVIR PHOSPHATE 75 MG PO CAPS: 75.0000 mg | ORAL_CAPSULE | Freq: Two times a day (BID) | ORAL | Status: DC

## 2013-12-06 MED ORDER — IBUPROFEN 600 MG PO TABS: 600.0000 mg | ORAL_TABLET | Freq: Four times a day (QID) | ORAL | Status: DC | PRN

## 2013-12-06 NOTE — Discharge Instructions (Signed)
Influenza, Adult Influenza ("the flu") is a viral infection of the respiratory tract. It occurs more often in winter months because people spend more time in close contact with one another. Influenza can make you feel very sick. Influenza easily spreads from person to person (contagious). CAUSES  Influenza is caused by a virus that infects the respiratory tract. You can catch the virus by breathing in droplets from an infected person's cough or sneeze. You can also catch the virus by touching something that was recently contaminated with the virus and then touching your mouth, nose, or eyes. SYMPTOMS  Symptoms typically last 4 to 10 days and may include:  Fever.  Chills.  Headache, body aches, and muscle aches.  Sore throat.  Chest discomfort and cough.  Poor appetite.  Weakness or feeling tired.  Dizziness.  Nausea or vomiting. DIAGNOSIS  Diagnosis of influenza is often made based on your history and a physical exam. A nose or throat swab test can be done to confirm the diagnosis. RISKS AND COMPLICATIONS You may be at risk for a more severe case of influenza if you smoke cigarettes, have diabetes, have chronic heart disease (such as heart failure) or lung disease (such as asthma), or if you have a weakened immune system. Elderly people and pregnant women are also at risk for more serious infections. The most common complication of influenza is a lung infection (pneumonia). Sometimes, this complication can require emergency medical care and may be life-threatening. PREVENTION  An annual influenza vaccination (flu shot) is the best way to avoid getting influenza. An annual flu shot is now routinely recommended for all adults in the [place] TREATMENT  In mild cases, influenza goes away on its own. Treatment is directed at relieving symptoms. For more severe cases, your caregiver may prescribe antiviral medicines to shorten the sickness. Antibiotic medicines are not effective, because the  infection is caused by a virus, not by bacteria. HOME CARE INSTRUCTIONS  Only take over-the-counter or prescription medicines for pain, discomfort, or fever as directed by your caregiver.  Use a cool mist humidifier to make breathing easier.  Get plenty of rest until your temperature returns to normal. This usually takes 3 to 4 days.  Drink enough fluids to keep your urine clear or pale yellow.  Cover your mouth and nose when coughing or sneezing, and wash your hands well to avoid spreading the virus.  Stay home from work or school until your fever has been gone for at least 1 full day. SEEK MEDICAL CARE IF:   You have chest pain or a deep cough that worsens or produces more mucus.  You have nausea, vomiting, or diarrhea. SEEK IMMEDIATE MEDICAL CARE IF:   You have difficulty breathing, shortness of breath, or your skin or nails turn bluish.  You have severe neck pain or stiffness.  You have a severe headache, facial pain, or earache.  You have a worsening or recurring fever.  You have nausea or vomiting that cannot be controlled. MAKE SURE YOU:  Understand these instructions.  Will watch your condition.  Will get help right away if you are not doing well or get worse. Document Released: 10/28/2000 Document Revised: 05/01/2012 Document Reviewed: 01/30/2012 [facility] Patient Information 2014 [place], [place].  Sinusitis Sinusitis is redness, soreness, and swelling (inflammation) of the paranasal sinuses. Paranasal sinuses are air pockets within the bones of your face (beneath the eyes, the middle of the forehead, or above the eyes). In healthy paranasal sinuses, mucus is able to drain  out, and air is able to circulate through them by way of your nose. However, when your paranasal sinuses are inflamed, mucus and air can become trapped. This can allow bacteria and other germs to grow and cause infection. Sinusitis can develop quickly and last only a short time (acute) or continue  over a long period (chronic). Sinusitis that lasts for more than 12 weeks is considered chronic.  CAUSES  Causes of sinusitis include:  Allergies.  Structural abnormalities, such as displacement of the cartilage that separates your nostrils (deviated septum), which can decrease the air flow through your nose and sinuses and affect sinus drainage.  Functional abnormalities, such as when the small hairs (cilia) that line your sinuses and help remove mucus do not work properly or are not present. SYMPTOMS  Symptoms of acute and chronic sinusitis are the same. The primary symptoms are pain and pressure around the affected sinuses. Other symptoms include:  Upper toothache.  Earache.  Headache.  Bad breath.  Decreased sense of smell and taste.  A cough, which worsens when you are lying flat.  Fatigue.  Fever.  Thick drainage from your nose, which often is green and may contain pus (purulent).  Swelling and warmth over the affected sinuses. DIAGNOSIS  Your caregiver will perform a physical exam. During the exam, your caregiver may:  Look in your nose for signs of abnormal growths in your nostrils (nasal polyps).  Tap over the affected sinus to check for signs of infection.  View the inside of your sinuses (endoscopy) with a special imaging device with a light attached (endoscope), which is inserted into your sinuses. If your caregiver suspects that you have chronic sinusitis, one or more of the following tests may be recommended:  Allergy tests.  Nasal culture A sample of mucus is taken from your nose and sent to a lab and screened for bacteria.  Nasal cytology A sample of mucus is taken from your nose and examined by your caregiver to determine if your sinusitis is related to an allergy. TREATMENT  Most cases of acute sinusitis are related to a viral infection and will resolve on their own within 10 days. Sometimes medicines are prescribed to help relieve symptoms (pain  medicine, decongestants, nasal steroid sprays, or saline sprays).  However, for sinusitis related to a bacterial infection, your caregiver will prescribe antibiotic medicines. These are medicines that will help kill the bacteria causing the infection.  Rarely, sinusitis is caused by a fungal infection. In theses cases, your caregiver will prescribe antifungal medicine. For some cases of chronic sinusitis, surgery is needed. Generally, these are cases in which sinusitis recurs more than 3 times per year, despite other treatments. HOME CARE INSTRUCTIONS   Drink plenty of water. Water helps thin the mucus so your sinuses can drain more easily.  Use a humidifier.  Inhale steam 3 to 4 times a day (for example, sit in the bathroom with the shower running).  Apply a warm, moist washcloth to your face 3 to 4 times a day, or as directed by your caregiver.  Use saline nasal sprays to help moisten and clean your sinuses.  Take over-the-counter or prescription medicines for pain, discomfort, or fever only as directed by your caregiver. SEEK IMMEDIATE MEDICAL CARE IF:  You have increasing pain or severe headaches.  You have nausea, vomiting, or drowsiness.  You have swelling around your face.  You have vision problems.  You have a stiff neck.  You have difficulty breathing. MAKE SURE YOU:  Understand these instructions.  Will watch your condition.  Will get help right away if you are not doing well or get worse. Document Released: 10/31/2005 Document Revised: 01/23/2012 Document Reviewed: 11/15/2011 [facility] Patient Information 2014 [place], [place].

## 2013-12-06 NOTE — ED Provider Notes (Signed)
CSN: [id number]     Arrival date & time 12/05/13  2254 History   First MD Initiated Contact with Patient 12/06/13 0113     Chief Complaint  Patient presents with  . URI   (Consider location/radiation/quality/duration/timing/severity/associated sxs/prior Treatment) HPI Patient presents with complaints of 2 days of sore throat, bilateral ear pain, nasal congestion, facial pain, diffuse myalgias, subjective fevers and chills, cough without sputum production, posttussive emesis. She has no known sick contacts. She has not been taking over-the-counter medication. She has no lower extremity swelling or pain. She has no neck stiffness or focal weakness. She has no visual changes. Past Medical History  Diagnosis Date  . Preterm labor     c/s at 36 wks  . Placenta previa     hx and with current pregnancy  . Migraines     last one over 1 month ago  . Anxiety     no meds  . Depression     no meds  . Asthma   . ADHD (attention deficit hyperactivity disorder)   . Scoliosis    Past Surgical History  Procedure Laterality Date  . Cesarean section  2010    x 1 at 36 wks in [place]  . Svd  2003    x 1 in [place]  . Wisdom tooth extraction    . Tubal ligation     Family History  Problem Relation Age of Onset  . Cancer Mother   . Hypertension Sister   . Sickle cell trait Daughter   . Asthma Son   . Asthma Paternal Grandmother   . Pseudochol deficiency Neg Hx   . Malignant hyperthermia Neg Hx   . Anesthesia problems Neg Hx   . Hypotension Neg Hx    History  Substance Use Topics  . Smoking status: Current Every Day Smoker -- 0.20 packs/day for 12 years    Types: Cigarettes  . Smokeless tobacco: Never Used  . Alcohol Use: No     Comment: PReviously drank on occ   OB History   Grav Para Term Preterm Abortions TAB SAB Ect Mult Living   3 3 1 2  0 0 0 0 0 3     Review of Systems  Constitutional: Positive for fever, chills and fatigue.  HENT: Positive for congestion, ear pain,  rhinorrhea, sinus pressure and sore throat.   Eyes: Negative for visual disturbance.  Respiratory: Positive for cough. Negative for shortness of breath and wheezing.   Cardiovascular: Negative for chest pain and palpitations.  Gastrointestinal: Positive for vomiting. Negative for nausea, abdominal pain, diarrhea and constipation.  Genitourinary: Negative for dysuria and flank pain.  Musculoskeletal: Positive for back pain, myalgias and neck pain. Negative for neck stiffness.  Skin: Negative for pallor, rash and wound.  Neurological: Positive for headaches. Negative for dizziness, syncope, weakness, light-headedness and numbness.  All other systems reviewed and are negative.    Allergies  Review of patient's allergies indicates no known allergies.  Home Medications   Current Outpatient Rx  Name  Route  Sig  Dispense  Refill  . guaiFENesin (ROBITUSSIN) 100 MG/5ML SOLN   Oral   Take 5 mLs by mouth every 4 (four) hours as needed for cough or to loosen phlegm.         Marland Kitchen levonorgestrel (MIRENA) 20 MCG/24HR IUD   Intrauterine   1 each by Intrauterine route continuous.           BP 126/78  Pulse 107  Temp(Src) 98.8 F (37.1  C) (Oral)  Resp 18  SpO2 99%  LMP 12/03/2013 Physical Exam  Nursing note and vitals reviewed. Constitutional: She is oriented to person, place, and time. She appears well-developed and well-nourished. No distress.  HENT:  Head: Normocephalic and atraumatic.  Mouth/Throat: Oropharynx is clear and moist.  Left frontal and left maxillary sinus tenderness to percussion. Patient has bilateral nasal mucosal edema. Mildly erythematous oropharynx without obvious exudates. Bilateral TM bulging  Eyes: EOM are normal. Pupils are equal, round, and reactive to light.  Neck: Normal range of motion. Neck supple.  No nuchal rigidity or other evidence of meningismus. Patient does have tenderness to palpation over her bilateral trapezius muscles right greater than left.   Cardiovascular: Normal rate and regular rhythm.  Exam reveals no gallop and no friction rub.   No murmur heard. Pulmonary/Chest: Effort normal and breath sounds normal. No respiratory distress. She has no wheezes. She has no rales. She exhibits no tenderness.  Abdominal: Soft. Bowel sounds are normal. She exhibits no distension and no mass. There is no tenderness. There is no rebound and no guarding.  Musculoskeletal: Normal range of motion. She exhibits tenderness. She exhibits no edema.  Diffuse thoracic and lumbar tenderness to palpation. No midline step offs or deformities. Patient has no calf swelling or tenderness.  Lymphadenopathy:    She has no cervical adenopathy.  Neurological: She is alert and oriented to person, place, and time.  Patient is alert and oriented x3 with clear, goal oriented speech. Patient has 5/5 motor in all extremities. Sensation is intact to light touch. Bilateral finger-to-nose is normal with no signs of dysmetria. Patient has a normal gait and walks without assistance.   Skin: Skin is warm and dry. No rash noted. No erythema.  Psychiatric: She has a normal mood and affect. Her behavior is normal.    ED Course  Procedures (including critical care time) Labs Review Labs Reviewed - No data to display Imaging Review Dg Chest 2 View  12/06/2013   CLINICAL DATA:  Cold symptoms.  Body aches.  EXAM: CHEST  2 VIEW  COMPARISON:  DG CHEST 2 VIEW dated 01/30/2013  FINDINGS: Moderate S-shaped thoracolumbar spine curvature. This distorts the appearance of the mediastinum on the frontal view. Midline trachea. Otherwise normal heart size and mediastinal contours. No pleural effusion or pneumothorax. Clear lungs.  IMPRESSION: No acute cardiopulmonary disease.   Electronically Signed   By: Abigail Miyamoto M.D.   On: 12/06/2013 00:44    EKG Interpretation   None       MDM  Likely URI though with diffuse myalgias and history of fever question patient may have influenza.  Discussed options with Tamiflu the patient and she states she would like a prescription for this. Return precautions have been given.    Julianne Rice, MD 12/06/13 [phone]

## 2013-12-07 ENCOUNTER — Emergency Department (HOSPITAL_COMMUNITY)
Admission: EM | Admit: 2013-12-07 | Discharge: 2013-12-07 | Disposition: A | Discharge status: Home / Self Care | Payer: Medicaid Other | Attending: Emergency Medicine | Admitting: Emergency Medicine

## 2013-12-07 ENCOUNTER — Encounter (HOSPITAL_COMMUNITY): Payer: Self-pay | Admitting: Emergency Medicine

## 2013-12-07 DIAGNOSIS — M4120 Other idiopathic scoliosis, site unspecified: Secondary | ICD-10-CM | POA: Insufficient documentation

## 2013-12-07 DIAGNOSIS — Z8659 Personal history of other mental and behavioral disorders: Secondary | ICD-10-CM | POA: Insufficient documentation

## 2013-12-07 DIAGNOSIS — J111 Influenza due to unidentified influenza virus with other respiratory manifestations: Secondary | ICD-10-CM | POA: Insufficient documentation

## 2013-12-07 DIAGNOSIS — J45909 Unspecified asthma, uncomplicated: Secondary | ICD-10-CM | POA: Insufficient documentation

## 2013-12-07 DIAGNOSIS — R69 Illness, unspecified: Secondary | ICD-10-CM

## 2013-12-07 DIAGNOSIS — Z8742 Personal history of other diseases of the female genital tract: Secondary | ICD-10-CM | POA: Insufficient documentation

## 2013-12-07 DIAGNOSIS — Z79899 Other long term (current) drug therapy: Secondary | ICD-10-CM | POA: Insufficient documentation

## 2013-12-07 DIAGNOSIS — IMO0002 Reserved for concepts with insufficient information to code with codable children: Secondary | ICD-10-CM | POA: Insufficient documentation

## 2013-12-07 DIAGNOSIS — F17200 Nicotine dependence, unspecified, uncomplicated: Secondary | ICD-10-CM | POA: Insufficient documentation

## 2013-12-07 DIAGNOSIS — Z8751 Personal history of pre-term labor: Secondary | ICD-10-CM | POA: Insufficient documentation

## 2013-12-07 DIAGNOSIS — Z975 Presence of (intrauterine) contraceptive device: Secondary | ICD-10-CM | POA: Insufficient documentation

## 2013-12-07 DIAGNOSIS — Z8679 Personal history of other diseases of the circulatory system: Secondary | ICD-10-CM | POA: Insufficient documentation

## 2013-12-07 MED ORDER — MORPHINE SULFATE 2 MG/ML IJ SOLN
2.0000 mg | Freq: Once | INTRAMUSCULAR | Status: AC
  Administered 2013-12-07: 2 mg via INTRAVENOUS
  Filled 2013-12-07: qty 1

## 2013-12-07 MED ORDER — KETOROLAC TROMETHAMINE 30 MG/ML IJ SOLN
30.0000 mg | Freq: Once | INTRAMUSCULAR | Status: DC
  Filled 2013-12-07: qty 1

## 2013-12-07 MED ORDER — HYDROCODONE-HOMATROPINE 5-1.5 MG/5ML PO SYRP: 5.0000 mL | ORAL_SOLUTION | Freq: Four times a day (QID) | ORAL | Status: DC | PRN

## 2013-12-07 MED ORDER — ALBUTEROL SULFATE HFA 108 (90 BASE) MCG/ACT IN AERS: 2.0000 | INHALATION_SPRAY | RESPIRATORY_TRACT | Status: DC | PRN

## 2013-12-07 MED ORDER — ACETAMINOPHEN 500 MG PO TABS
1000.0000 mg | ORAL_TABLET | Freq: Once | ORAL | Status: AC
  Administered 2013-12-07: 1000 mg via ORAL
  Filled 2013-12-07: qty 2

## 2013-12-07 MED ORDER — SODIUM CHLORIDE 0.9 % IV BOLUS (SEPSIS)
1000.0000 mL | Freq: Once | INTRAVENOUS | Status: AC
Start: 2013-12-07 — End: 2013-12-07
  Administered 2013-12-07: 1000 mL via INTRAVENOUS

## 2013-12-07 NOTE — ED Notes (Signed)
Pt c/o cough, gen body aches, nasal congestion x 1 wk.

## 2013-12-07 NOTE — Discharge Instructions (Signed)
.Take medications as prescribed. Followup with your doctor in regards to your hospital visit. If you do not have a doctor use the resource guide listed below to help he find one. You may return to the emergency department if symptoms worsen, become progressive, or become more concerning. Read below to learn more about your diagnosis & reasons to return. Use Tylenol to treat your fever  Do not drive, operate heavy machinery, drink alcohol with this medicine.  Influenza, Adult  Influenza (flu)  starts suddenly, usually with a fever. It causes chills, dry and hacking cough, headache, body aches, and sore throat. Influenza spreads easily from one person to another.  HOME CARE  Only take medicines as told by your doctor.  Rest.  Drink enough fluids to keep your pee (urine) clear or pale yellow.  Wash your hands often. Do this after you blow your nose, after you go to the bathroom, and before you touch food.  GET HELP RIGHT AWAY IF:  You have shortness of breath while resting.  You have pain or pressure in the chest or belly (abdomen).  You suddenly feel dizzy.  You feel confused.  You have a hard time breathing.  Your skin or nails turn bluish in color.  You get a bad neck pain or stiffness.  You get a bad headache, face pain, or earache.  You throw up (vomit) a lot and often.  You have a fever > 101 that persists  MAKE SURE YOU:  Understand these instructions.  Will watch your condition.  Will get help right away if you are not doing well or get worse.    RESOURCE GUIDE  Dental Problems  Patients with Medicaid: [place] Phone:  [phone]                                                  Phone:  [phone]  If unable to pay or uninsured, contact:  [facility] or [facility]. to become qualified for the adult dental clinic.  Chronic  Pain Problems Contact [facility]  [phone] Patients need to be referred by their primary care doctor.  Insufficient Money for Medicine Contact [employer]:  call "211" or [place] 423-194-3385.  No Primary Care Doctor Call [facility]  [phone] Other agencies that provide inexpensive medical care    [place]  [phone]    [facility]  [place]  [phone]    [facility]  [phone]    [facility]  [place]  [place]  [phone] [place]   [phone] (emergency services [phone])  Substance Abuse Resources Alcohol and Drug Services  [place] [phone] [facility] [place] 2068021997 Residential & Outpatient Substance Abuse Program  [phone]  Abuse/Neglect [place] [phone] [place] [phone] (After Hours)  Emergency [place] [phone]  [place] [facility] [place] [phone] [place] [phone]  MRSA Hotline #:   951-081-3685    [facility] Dept. [address]      [place] Phone:  [phone]                                   Phone:  [phone]                 Phone:  [place] Phone:  [place] [phone] (After Hours)

## 2013-12-07 NOTE — ED Provider Notes (Signed)
CSN: [id number]     Arrival date & time 12/07/13  1425 History  This chart was scribed for non-physician practitioner, Margarita Mail, PA-C working with Threasa Beards, MD by Frederich Balding, ED scribe. This patient was seen in room [place] and the patient's care was started at 4:08 PM.   Chief Complaint  Patient presents with  . Generalized Body Aches  . Cough  . Nasal Congestion   The history is provided by the patient. No language interpreter was used.   HPI Comments: Penny [name] is a 33 y.o. female who presents to the Emergency Department complaining of cough, generalized body aches, headache and nasal congestion that started one week ago. Pt was recently seen for the same, diagnosed with the flu and given tessalon perles, tamiflu and nasonex with no relief. She has also taken tylenol and motrin with no relief. Pt states she has been eating and drinking when she can. Denies history of asthma.   Past Medical History  Diagnosis Date  . Preterm labor     c/s at 36 wks  . Placenta previa     hx and with current pregnancy  . Migraines     last one over 1 month ago  . Anxiety     no meds  . Depression     no meds  . Asthma   . ADHD (attention deficit hyperactivity disorder)   . Scoliosis    Past Surgical History  Procedure Laterality Date  . Cesarean section  2010    x 1 at 36 wks in [place]  . Svd  2003    x 1 in [place]  . Wisdom tooth extraction    . Tubal ligation     Family History  Problem Relation Age of Onset  . Cancer Mother   . Hypertension Sister   . Sickle cell trait Daughter   . Asthma Son   . Asthma Paternal Grandmother   . Pseudochol deficiency Neg Hx   . Malignant hyperthermia Neg Hx   . Anesthesia problems Neg Hx   . Hypotension Neg Hx    History  Substance Use Topics  . Smoking status: Current Every Day Smoker -- 0.20 packs/day for 12 years    Types: Cigarettes  . Smokeless tobacco: Never Used  . Alcohol Use: No     Comment: PReviously drank  on occ   OB History   Grav Para Term Preterm Abortions TAB SAB Ect Mult Living   3 3 1 2  0 0 0 0 0 3     Review of Systems  Constitutional: Negative for fever.  HENT: Positive for congestion.   Eyes: Negative for redness.  Respiratory: Positive for cough.   Cardiovascular: Negative for chest pain.  Gastrointestinal: Negative for abdominal pain.  Musculoskeletal: Positive for myalgias.  Skin: Negative for rash.  Neurological: Positive for headaches. Negative for speech difficulty.  Psychiatric/Behavioral: Negative for confusion.    Allergies  Review of patient's allergies indicates no known allergies.  Home Medications   Current Outpatient Rx  Name  Route  Sig  Dispense  Refill  . benzonatate (TESSALON) 100 MG capsule   Oral   Take 1 capsule (100 mg total) by mouth every 8 (eight) hours.   21 capsule   0   . guaiFENesin (ROBITUSSIN) 100 MG/5ML SOLN   Oral   Take 5 mLs by mouth every 4 (four) hours as needed for cough or to loosen phlegm.         Marland Kitchen  ibuprofen (ADVIL,MOTRIN) 600 MG tablet   Oral   Take 1 tablet (600 mg total) by mouth every 6 (six) hours as needed.   30 tablet   0   . mometasone (NASONEX) 50 MCG/ACT nasal spray   Nasal   Place 2 sprays into the nose daily.   17 g   12   . oseltamivir (TAMIFLU) 75 MG capsule   Oral   Take 1 capsule (75 mg total) by mouth every 12 (twelve) hours.   10 capsule   0   . levonorgestrel (MIRENA) 20 MCG/24HR IUD   Intrauterine   1 each by Intrauterine route continuous.           BP 108/91  Pulse 72  Temp(Src) 97.6 F (36.4 C) (Oral)  Resp 18  SpO2 100%  LMP 12/03/2013  Physical Exam  Nursing note and vitals reviewed. Constitutional: She is oriented to person, place, and time. She appears well-developed and well-nourished. No distress.  HENT:  Head: Normocephalic and atraumatic.  Eyes: EOM are normal.  Neck: Neck supple. No tracheal deviation present.  Cardiovascular: Normal rate, regular rhythm and  normal heart sounds.   Pulmonary/Chest: Effort normal. No respiratory distress. She has wheezes in the right middle field and the left middle field. She has no rales.  Musculoskeletal: Normal range of motion.  Neurological: She is alert and oriented to person, place, and time.  Skin: Skin is warm and dry.  Psychiatric: She has a normal mood and affect. Her behavior is normal.    ED Course  Procedures (including critical care time)  DIAGNOSTIC STUDIES: Oxygen Saturation is 100% on RA, normal by my interpretation.    COORDINATION OF CARE: 4:11 PM-Discussed treatment plan which includes IV fluids and pain medication with pt at bedside and pt agreed to plan.   Labs Review Labs Reviewed - No data to display Imaging Review Dg Chest 2 View  12/06/2013   CLINICAL DATA:  Cold symptoms.  Body aches.  EXAM: CHEST  2 VIEW  COMPARISON:  DG CHEST 2 VIEW dated 01/30/2013  FINDINGS: Moderate S-shaped thoracolumbar spine curvature. This distorts the appearance of the mediastinum on the frontal view. Midline trachea. Otherwise normal heart size and mediastinal contours. No pleural effusion or pneumothorax. Clear lungs.  IMPRESSION: No acute cardiopulmonary disease.   Electronically Signed   By: Dhruv Christina Miyamoto M.D.   On: 12/06/2013 00:44    EKG Interpretation   None       MDM   1. Influenza-like illness    Patient with symptoms consistent with influenza.  Vitals are stable, low-grade fever.  \ signs of mild dehydration, tolerating PO's.  Lungs are clear. Patient cxr form 2 days ago was neg.  The patient understands that symptoms are greater than the recommended 24-48 hour window of treatment.  Patient will be discharged with instructions to orally hydrate, rest, and use over-the-counter medications such as anti-inflammatories ibuprofen and Aleve for muscle aches and Tylenol for fever.  Patient will also be given a cough suppressant.   I personally performed the services described in this documentation,  which was scribed in my presence. The recorded information has been reviewed and is accurate.    Margarita Mail, PA-C 12/07/13 2128

## 2013-12-11 NOTE — ED Provider Notes (Signed)
Medical screening examination/treatment/procedure(s) were performed by non-physician practitioner and as supervising physician I was immediately available for consultation/collaboration.  EKG Interpretation   None        Threasa Beards, MD 12/11/13 1003

## 2013-12-20 ENCOUNTER — Emergency Department (HOSPITAL_COMMUNITY)
Admission: EM | Admit: 2013-12-20 | Discharge: 2013-12-20 | Disposition: A | Discharge status: Home / Self Care | Payer: Medicaid Other | Attending: Emergency Medicine | Admitting: Emergency Medicine

## 2013-12-20 ENCOUNTER — Encounter (HOSPITAL_COMMUNITY): Payer: Self-pay | Admitting: Emergency Medicine

## 2013-12-20 DIAGNOSIS — IMO0002 Reserved for concepts with insufficient information to code with codable children: Secondary | ICD-10-CM | POA: Insufficient documentation

## 2013-12-20 DIAGNOSIS — Z79899 Other long term (current) drug therapy: Secondary | ICD-10-CM | POA: Insufficient documentation

## 2013-12-20 DIAGNOSIS — Z975 Presence of (intrauterine) contraceptive device: Secondary | ICD-10-CM | POA: Insufficient documentation

## 2013-12-20 DIAGNOSIS — J45909 Unspecified asthma, uncomplicated: Secondary | ICD-10-CM | POA: Insufficient documentation

## 2013-12-20 DIAGNOSIS — H9209 Otalgia, unspecified ear: Secondary | ICD-10-CM | POA: Insufficient documentation

## 2013-12-20 DIAGNOSIS — Z8679 Personal history of other diseases of the circulatory system: Secondary | ICD-10-CM | POA: Insufficient documentation

## 2013-12-20 DIAGNOSIS — M4120 Other idiopathic scoliosis, site unspecified: Secondary | ICD-10-CM | POA: Insufficient documentation

## 2013-12-20 DIAGNOSIS — F17200 Nicotine dependence, unspecified, uncomplicated: Secondary | ICD-10-CM | POA: Insufficient documentation

## 2013-12-20 DIAGNOSIS — Z8659 Personal history of other mental and behavioral disorders: Secondary | ICD-10-CM | POA: Insufficient documentation

## 2013-12-20 DIAGNOSIS — Z792 Long term (current) use of antibiotics: Secondary | ICD-10-CM | POA: Insufficient documentation

## 2013-12-20 DIAGNOSIS — J329 Chronic sinusitis, unspecified: Secondary | ICD-10-CM | POA: Insufficient documentation

## 2013-12-20 DIAGNOSIS — J069 Acute upper respiratory infection, unspecified: Secondary | ICD-10-CM

## 2013-12-20 DIAGNOSIS — Z8751 Personal history of pre-term labor: Secondary | ICD-10-CM | POA: Insufficient documentation

## 2013-12-20 DIAGNOSIS — Z8742 Personal history of other diseases of the female genital tract: Secondary | ICD-10-CM | POA: Insufficient documentation

## 2013-12-20 MED ORDER — AZITHROMYCIN 250 MG PO TABS: 250.0000 mg | ORAL_TABLET | Freq: Every day | ORAL | Status: DC

## 2013-12-20 MED ORDER — IBUPROFEN 600 MG PO TABS: 600.0000 mg | ORAL_TABLET | Freq: Four times a day (QID) | ORAL | Status: DC | PRN

## 2013-12-20 MED ORDER — AMOXICILLIN-POT CLAVULANATE 875-125 MG PO TABS: 1.0000 | ORAL_TABLET | Freq: Two times a day (BID) | ORAL | Status: DC

## 2013-12-20 MED ORDER — IBUPROFEN 800 MG PO TABS
800.0000 mg | ORAL_TABLET | Freq: Once | ORAL | Status: AC
  Administered 2013-12-20: 800 mg via ORAL
  Filled 2013-12-20: qty 1

## 2013-12-20 MED ORDER — ONDANSETRON 4 MG PO TBDP
4.0000 mg | ORAL_TABLET | Freq: Once | ORAL | Status: AC
Start: 2013-12-20 — End: 2013-12-20
  Administered 2013-12-20: 4 mg via ORAL
  Filled 2013-12-20: qty 1

## 2013-12-20 NOTE — ED Notes (Signed)
Pt from home via EMS with c/o flu-like symp - cough, congestion, sore throat, generalized body aches.  Pt speaking full/clear sentences, rr even/un-lab.  Pt denies chest pain/palpitations.  Skin pwd.  MAEI.  Pt reports seen here x2 weeks ago for same and was feeling better and then "got worse again".  NAD.

## 2013-12-20 NOTE — ED Notes (Signed)
Pt from home with c/o body aches, HA, sore throat x1.5 weeks.  Pt seen here for same.  Ambulatory per EMS.  Skin pwd.  A+ox4.

## 2013-12-20 NOTE — Discharge Instructions (Signed)
Cool Mist Vaporizers °Vaporizers may help relieve the symptoms of a cough and cold. They add moisture to the air, which helps mucus to become thinner and less sticky. This makes it easier to breathe and cough up secretions. Cool mist vaporizers do not cause serious burns like hot mist vaporizers ("steamers, humidifiers"). Vaporizers have not been proved to show they help with colds. You should not use a vaporizer if you are allergic to mold.  °HOME CARE INSTRUCTIONS °· Follow the package instructions for the vaporizer. °· Do not use anything other than distilled water in the vaporizer. °· Do not run the vaporizer all of the time. This can cause mold or bacteria to grow in the vaporizer. °· Clean the vaporizer after each time it is used. °· Clean and dry the vaporizer well before storing it. °· Stop using the vaporizer if worsening respiratory symptoms develop. °Document Released: 07/28/2004 Document Revised: 07/03/2013 Document Reviewed: 03/20/2013 °[employer]® Patient Information ©2014 [employer]. ° °

## 2013-12-20 NOTE — ED Provider Notes (Signed)
CSN: [id number]     Arrival date & time 12/20/13  2010 History  This chart was scribed for Noland Fordyce, non-physician practitioner, working with Leota Jacobsen, MD by Jenne Campus, ED Scribe. This patient was seen in room WTR7/WTR7 and the patient's care was started at 9:17 PM.   Chief Complaint  Patient presents with  . Generalized Body Aches    The history is provided by the patient. No language interpreter was used.    HPI Comments: Penny [name] is a 33 y.o. female who presents to the Emergency Department complaining of 3 days of worsening myalgias with associated diffuse HA, otalgia, nasal congestion, hoarse voice, cough, sore throat, nausea and emesis for the past 3 days. She reports 3 to 4 episodes of emesis tonight. The HA radiates into her neck and the myalgias are rated as severe. She states that the cough causes severe burning CP and worsens her sore throat. She was seen on 12/06/13 and 12/07/13 for the same and was diagnosed with an URI and influenza. She was given Tamiful which she reports resolved the symptoms until they returned 3 days ago. She has been taking Motrin 600 mg, Excedrin,  Nyquill and Phenergan and using nasal saline with mild improvement. She denies any urinary symptoms. She denies any h/o asthma. LNMP was 11/21/13.   Past Medical History  Diagnosis Date  . Preterm labor     c/s at 36 wks  . Placenta previa     hx and with current pregnancy  . Migraines     last one over 1 month ago  . Anxiety     no meds  . Depression     no meds  . Asthma   . ADHD (attention deficit hyperactivity disorder)   . Scoliosis    Past Surgical History  Procedure Laterality Date  . Cesarean section  2010    x 1 at 36 wks in [place]  . Svd  2003    x 1 in [place]  . Wisdom tooth extraction    . Tubal ligation     Family History  Problem Relation Age of Onset  . Cancer Mother   . Hypertension Sister   . Sickle cell trait Daughter   . Asthma Son   . Asthma Paternal  Grandmother   . Pseudochol deficiency Neg Hx   . Malignant hyperthermia Neg Hx   . Anesthesia problems Neg Hx   . Hypotension Neg Hx    History  Substance Use Topics  . Smoking status: Current Every Day Smoker -- 0.20 packs/day for 12 years    Types: Cigarettes  . Smokeless tobacco: Never Used  . Alcohol Use: No     Comment: PReviously drank on occ   OB History   Grav Para Term Preterm Abortions TAB SAB Ect Mult Living   3 3 1 2  0 0 0 0 0 3     Review of Systems  HENT: Positive for congestion, ear pain, sore throat and voice change.   Respiratory: Positive for cough.   Cardiovascular: Positive for chest pain.  Gastrointestinal: Positive for nausea and vomiting.  Genitourinary: Negative for dysuria, urgency and frequency.  Musculoskeletal: Positive for myalgias.  Neurological: Positive for headaches.  All other systems reviewed and are negative.    Allergies  Review of patient's allergies indicates no known allergies.  Home Medications   Current Outpatient Rx  Name  Route  Sig  Dispense  Refill  . benzonatate (TESSALON) 100 MG capsule  Oral   Take 1 capsule (100 mg total) by mouth every 8 (eight) hours.   21 capsule   0   . ibuprofen (ADVIL,MOTRIN) 600 MG tablet   Oral   Take 1 tablet (600 mg total) by mouth every 6 (six) hours as needed.   30 tablet   0   . levonorgestrel (MIRENA) 20 MCG/24HR IUD   Intrauterine   1 each by Intrauterine route continuous.          . mometasone (NASONEX) 50 MCG/ACT nasal spray   Nasal   Place 2 sprays into the nose daily.   17 g   12   . oxymetazoline (AFRIN) 0.05 % nasal spray   Each Nare   Place 1-2 sprays into both nostrils 2 (two) times daily as needed for congestion.         Marland Kitchen Phenyleph-Doxylamine-DM-APAP (VICKS NYQUIL SEVERE COLD/FLU) 5-6.25-10-325 MG/15ML LIQD   Oral   Take 30 mLs by mouth every 6 (six) hours as needed (cold/ flu symptoms).         . promethazine (PHENERGAN) 25 MG tablet   Oral   Take  25 mg by mouth every 6 (six) hours as needed for nausea or vomiting.         Marland Kitchen albuterol (PROVENTIL HFA;VENTOLIN HFA) 108 (90 BASE) MCG/ACT inhaler   Inhalation   Inhale 2 puffs into the lungs every 4 (four) hours as needed for wheezing or shortness of breath.   1 Inhaler   0   . amoxicillin-clavulanate (AUGMENTIN) 875-125 MG per tablet   Oral   Take 1 tablet by mouth every 12 (twelve) hours.   14 tablet   0   . HYDROcodone-homatropine (HYCODAN) 5-1.5 MG/5ML syrup   Oral   Take 5 mLs by mouth every 6 (six) hours as needed for cough.   30 mL   0   . ibuprofen (ADVIL,MOTRIN) 600 MG tablet   Oral   Take 1 tablet (600 mg total) by mouth every 6 (six) hours as needed.   30 tablet   0   . oseltamivir (TAMIFLU) 75 MG capsule   Oral   Take 1 capsule (75 mg total) by mouth every 12 (twelve) hours.   10 capsule   0    Triage Vitals: BP 114/77  Pulse 97  Temp(Src) 99.2 F (37.3 C) (Oral)  Resp 16  SpO2 100%  LMP 12/03/2013  Physical Exam  Nursing note and vitals reviewed. Constitutional: She is oriented to person, place, and time. She appears well-developed and well-nourished.  Pt sitting in exam chair with surgical mask in place, appears acutely sick.  Intermittent hacking cough.  HENT:  Head: Normocephalic and atraumatic.  Right Ear: Hearing, external ear and ear canal normal. No tenderness. No mastoid tenderness. Tympanic membrane is bulging. Tympanic membrane is not perforated and not erythematous.  Left Ear: Hearing, external ear and ear canal normal. No mastoid tenderness. Tympanic membrane is erythematous and bulging. Tympanic membrane is not perforated.  Nose: Mucosal edema and rhinorrhea present. Right sinus exhibits maxillary sinus tenderness and frontal sinus tenderness. Left sinus exhibits maxillary sinus tenderness and frontal sinus tenderness.  Mouth/Throat: Uvula is midline and mucous membranes are normal. No trismus in the jaw. Posterior oropharyngeal edema  and posterior oropharyngeal erythema present. No oropharyngeal exudate or tonsillar abscesses.  Hoarse voice, erythema and edema of the oropharynx, no exudates, TMs are clear bilaterally  Eyes: Conjunctivae are normal. Pupils are equal, round, and reactive to light. No scleral icterus.  Neck: Normal range of motion. Neck supple. No JVD present. No tracheal deviation present. No thyromegaly present.  Cardiovascular: Normal rate, regular rhythm and normal heart sounds.   Pulmonary/Chest: Effort normal and breath sounds normal. No stridor. No respiratory distress. She has no wheezes. She has no rales. She exhibits no tenderness.  No respiratory distress, able to speak in full sentences w/o difficulty. Lungs: CTAB  Abdominal: Soft. Bowel sounds are normal. She exhibits no distension and no mass. There is no tenderness. There is no rebound and no guarding.  Musculoskeletal: Normal range of motion.  Lymphadenopathy:    She has no cervical adenopathy.  Neurological: She is alert and oriented to person, place, and time.  Skin: Skin is warm and dry.  Psychiatric: She has a normal mood and affect. Her behavior is normal.    ED Course  Procedures (including critical care time)  DIAGNOSTIC STUDIES: Oxygen Saturation is 100% on RA, normal by my interpretation.    COORDINATION OF CARE: 9:23 PM-Discussed treatment plan which includes antibiotics and Motrin 600 mg with pt at bedside and pt agreed to plan.   Labs Review Labs Reviewed - No data to display Imaging Review No results found.  EKG Interpretation   None       MDM   1. Sinusitis   2. URI, acute    Previous medical records reviewed. Pt was seen for similar symptoms initially on 12/05/13, tx with Tamiflu, and seen again on 1/24 for same.  Pt had negative CXR at that time. Today pt has hoarse voice but no respiratory distress. Lungs: CTAB.  Pt does have bulging TMs, left-erythematous and bulging. Pt has severe frontal and maxillary  tenderness. Reports no improvement with medications prescribed on 1/24, however does stated she improved and symptoms worsened over last 3 days.  Will tx for sinusitis due to pt's persistent symptoms and frontal/maxillary sinus tenderness.  Advised to f/u next week with her PCP.  Rx: Augmentin and ibuprofen. Pt states she has phenergan at home.   I personally performed the services described in this documentation, which was scribed in my presence. The recorded information has been reviewed and is accurate.    Noland Fordyce, PA-C 12/20/13 2140

## 2013-12-21 NOTE — ED Provider Notes (Signed)
Medical screening examination/treatment/procedure(s) were performed by non-physician practitioner and as supervising physician I was immediately available for consultation/collaboration.  Leota Jacobsen, MD 12/21/13 2322

## 2013-12-31 ENCOUNTER — Encounter (HOSPITAL_COMMUNITY): Payer: Self-pay | Admitting: Emergency Medicine

## 2013-12-31 ENCOUNTER — Emergency Department (HOSPITAL_COMMUNITY)
Admission: EM | Admit: 2013-12-31 | Discharge: 2013-12-31 | Disposition: A | Discharge status: Home / Self Care | Payer: Medicaid Other | Attending: Emergency Medicine | Admitting: Emergency Medicine

## 2013-12-31 DIAGNOSIS — Z8751 Personal history of pre-term labor: Secondary | ICD-10-CM | POA: Insufficient documentation

## 2013-12-31 DIAGNOSIS — Z8659 Personal history of other mental and behavioral disorders: Secondary | ICD-10-CM | POA: Insufficient documentation

## 2013-12-31 DIAGNOSIS — B9789 Other viral agents as the cause of diseases classified elsewhere: Secondary | ICD-10-CM | POA: Insufficient documentation

## 2013-12-31 DIAGNOSIS — B349 Viral infection, unspecified: Secondary | ICD-10-CM

## 2013-12-31 DIAGNOSIS — Z8679 Personal history of other diseases of the circulatory system: Secondary | ICD-10-CM | POA: Insufficient documentation

## 2013-12-31 DIAGNOSIS — J45909 Unspecified asthma, uncomplicated: Secondary | ICD-10-CM | POA: Insufficient documentation

## 2013-12-31 DIAGNOSIS — Z79899 Other long term (current) drug therapy: Secondary | ICD-10-CM | POA: Insufficient documentation

## 2013-12-31 DIAGNOSIS — F17200 Nicotine dependence, unspecified, uncomplicated: Secondary | ICD-10-CM | POA: Insufficient documentation

## 2013-12-31 DIAGNOSIS — Z8739 Personal history of other diseases of the musculoskeletal system and connective tissue: Secondary | ICD-10-CM | POA: Insufficient documentation

## 2013-12-31 DIAGNOSIS — IMO0002 Reserved for concepts with insufficient information to code with codable children: Secondary | ICD-10-CM | POA: Insufficient documentation

## 2013-12-31 MED ORDER — AZITHROMYCIN 250 MG PO TABS: ORAL_TABLET | ORAL | Status: DC

## 2013-12-31 MED ORDER — OXYCODONE-ACETAMINOPHEN 5-325 MG PO TABS
2.0000 | ORAL_TABLET | Freq: Once | ORAL | Status: AC
  Administered 2013-12-31: 2 via ORAL
  Filled 2013-12-31: qty 2

## 2013-12-31 MED ORDER — FEXOFENADINE-PSEUDOEPHED ER 60-120 MG PO TB12: 1.0000 | ORAL_TABLET | Freq: Two times a day (BID) | ORAL | Status: DC

## 2013-12-31 MED ORDER — PREDNISONE 20 MG PO TABS: 40.0000 mg | ORAL_TABLET | Freq: Every day | ORAL | Status: DC

## 2013-12-31 NOTE — Discharge Instructions (Signed)
Cough, Adult  A cough is a reflex that helps clear your throat and airways. It can help heal the body or may be a reaction to an irritated airway. A cough may only last 2 or 3 weeks (acute) or may last more than 8 weeks (chronic).  CAUSES Acute cough:  Viral or bacterial infections. Chronic cough:  Infections.  Allergies.  Asthma.  Post-nasal drip.  Smoking.  Heartburn or acid reflux.  Some medicines.  Chronic lung problems (COPD).  Cancer. SYMPTOMS   Cough.  Fever.  Chest pain.  Increased breathing rate.  High-pitched whistling sound when breathing (wheezing).  Colored mucus that you cough up (sputum). TREATMENT   A bacterial cough may be treated with antibiotic medicine.  A viral cough must run its course and will not respond to antibiotics.  Your caregiver may recommend other treatments if you have a chronic cough. HOME CARE INSTRUCTIONS   Only take over-the-counter or prescription medicines for pain, discomfort, or fever as directed by your caregiver. Use cough suppressants only as directed by your caregiver.  Use a cold steam vaporizer or humidifier in your bedroom or home to help loosen secretions.  Sleep in a semi-upright position if your cough is worse at night.  Rest as needed.  Stop smoking if you smoke. SEEK IMMEDIATE MEDICAL CARE IF:   You have pus in your sputum.  Your cough starts to worsen.  You cannot control your cough with suppressants and are losing sleep.  You begin coughing up blood.  You have difficulty breathing.  You develop pain which is getting worse or is uncontrolled with medicine.  You have a fever. MAKE SURE YOU:   Understand these instructions.  Will watch your condition.  Will get help right away if you are not doing well or get worse. Document Released: 04/29/2011 Document Revised: 01/23/2012 Document Reviewed: 04/29/2011 [facility] Patient Information 2014 [place].  Viral Infections A viral  infection can be caused by different types of viruses.Most viral infections are not serious and resolve on their own. However, some infections may cause severe symptoms and may lead to further complications. SYMPTOMS Viruses can frequently cause:  Minor sore throat.  Aches and pains.  Headaches.  Runny nose.  Different types of rashes.  Watery eyes.  Tiredness.  Cough.  Loss of appetite.  Gastrointestinal infections, resulting in nausea, vomiting, and diarrhea. These symptoms do not respond to antibiotics because the infection is not caused by bacteria. However, you might catch a bacterial infection following the viral infection. This is sometimes called a "superinfection." Symptoms of such a bacterial infection may include:  Worsening sore throat with pus and difficulty swallowing.  Swollen neck glands.  Chills and a high or persistent fever.  Severe headache.  Tenderness over the sinuses.  Persistent overall ill feeling (malaise), muscle aches, and tiredness (fatigue).  Persistent cough.  Yellow, green, or brown mucus production with coughing. HOME CARE INSTRUCTIONS   Only take over-the-counter or prescription medicines for pain, discomfort, diarrhea, or fever as directed by your caregiver.  Drink enough water and fluids to keep your urine clear or pale yellow. Sports drinks can provide valuable electrolytes, sugars, and hydration.  Get plenty of rest and maintain proper nutrition. Soups and broths with crackers or rice are fine. SEEK IMMEDIATE MEDICAL CARE IF:   You have severe headaches, shortness of breath, chest pain, neck pain, or an unusual rash.  You have uncontrolled vomiting, diarrhea, or you are unable to keep down fluids.  You or  your child has an oral temperature above 102 F (38.9 C), not controlled by medicine.  Your baby is older than 3 months with a rectal temperature of 102 F (38.9 C) or higher.  Your baby is 41 months old or younger with  a rectal temperature of 100.4 F (38 C) or higher. MAKE SURE YOU:   Understand these instructions.  Will watch your condition.  Will get help right away if you are not doing well or get worse. Document Released: 08/10/2005 Document Revised: 01/23/2012 Document Reviewed: 03/07/2011 [facility] Patient Information 2014 [place], [place].

## 2013-12-31 NOTE — ED Notes (Addendum)
Pt c/o emesis, sore throat, and chest congestion x 4 days.  Pt has been seen previously for same, but sts that symptoms had resolved and returned x 4 days ago.  Pt reports that she took the full course of Augmentin.  Sts that her children have been sick too.  Sts "I wish they would have given me a Z pack, I know it works."

## 2014-01-05 ENCOUNTER — Encounter (HOSPITAL_COMMUNITY): Payer: Self-pay | Admitting: Emergency Medicine

## 2014-01-05 ENCOUNTER — Emergency Department (HOSPITAL_COMMUNITY)
Admission: EM | Admit: 2014-01-05 | Discharge: 2014-01-05 | Disposition: A | Discharge status: Home / Self Care | Payer: Medicaid Other | Attending: Emergency Medicine | Admitting: Emergency Medicine

## 2014-01-05 DIAGNOSIS — M791 Myalgia, unspecified site: Secondary | ICD-10-CM

## 2014-01-05 DIAGNOSIS — J45909 Unspecified asthma, uncomplicated: Secondary | ICD-10-CM | POA: Insufficient documentation

## 2014-01-05 DIAGNOSIS — R5381 Other malaise: Secondary | ICD-10-CM | POA: Insufficient documentation

## 2014-01-05 DIAGNOSIS — F17200 Nicotine dependence, unspecified, uncomplicated: Secondary | ICD-10-CM | POA: Insufficient documentation

## 2014-01-05 DIAGNOSIS — Z8739 Personal history of other diseases of the musculoskeletal system and connective tissue: Secondary | ICD-10-CM | POA: Insufficient documentation

## 2014-01-05 DIAGNOSIS — R5383 Other fatigue: Principal | ICD-10-CM

## 2014-01-05 DIAGNOSIS — IMO0002 Reserved for concepts with insufficient information to code with codable children: Secondary | ICD-10-CM | POA: Insufficient documentation

## 2014-01-05 DIAGNOSIS — Z8659 Personal history of other mental and behavioral disorders: Secondary | ICD-10-CM | POA: Insufficient documentation

## 2014-01-05 DIAGNOSIS — J3489 Other specified disorders of nose and nasal sinuses: Secondary | ICD-10-CM | POA: Insufficient documentation

## 2014-01-05 DIAGNOSIS — R112 Nausea with vomiting, unspecified: Secondary | ICD-10-CM | POA: Insufficient documentation

## 2014-01-05 DIAGNOSIS — Z79899 Other long term (current) drug therapy: Secondary | ICD-10-CM | POA: Insufficient documentation

## 2014-01-05 DIAGNOSIS — Z8679 Personal history of other diseases of the circulatory system: Secondary | ICD-10-CM | POA: Insufficient documentation

## 2014-01-05 LAB — CBC WITH DIFFERENTIAL/PLATELET
Basophils Absolute: 0 10*3/uL (ref 0.0–0.1)
Basophils Relative: 0 % (ref 0–1)
Eosinophils Absolute: 0.3 10*3/uL (ref 0.0–0.7)
Eosinophils Relative: 3 % (ref 0–5)
HCT: 40.2 % (ref 36.0–46.0)
Hemoglobin: 13.6 g/dL (ref 12.0–15.0)
Lymphocytes Relative: 41 % (ref 12–46)
Lymphs Abs: 3.5 10*3/uL (ref 0.7–4.0)
MCH: 28.5 pg (ref 26.0–34.0)
MCHC: 33.8 g/dL (ref 30.0–36.0)
MCV: 84.1 fL (ref 78.0–100.0)
Monocytes Absolute: 0.4 10*3/uL (ref 0.1–1.0)
Monocytes Relative: 5 % (ref 3–12)
Neutro Abs: 4.4 10*3/uL (ref 1.7–7.7)
Neutrophils Relative %: 51 % (ref 43–77)
Platelets: 342 10*3/uL (ref 150–400)
RBC: 4.78 MIL/uL (ref 3.87–5.11)
RDW: 13.9 % (ref 11.5–15.5)
WBC: 8.6 10*3/uL (ref 4.0–10.5)

## 2014-01-05 LAB — COMPREHENSIVE METABOLIC PANEL
ALT: 8 U/L (ref 0–35)
AST: 14 U/L (ref 0–37)
Albumin: 3.5 g/dL (ref 3.5–5.2)
Alkaline Phosphatase: 67 U/L (ref 39–117)
BUN: 5 mg/dL — ABNORMAL LOW (ref 6–23)
CO2: 24 mEq/L (ref 19–32)
Calcium: 8.7 mg/dL (ref 8.4–10.5)
Chloride: 107 mEq/L (ref 96–112)
Creatinine, Ser: 0.57 mg/dL (ref 0.50–1.10)
GFR calc Af Amer: >90 mL/min (ref >90)
GFR calc non Af Amer: >90 mL/min (ref >90)
Glucose, Bld: 106 mg/dL — ABNORMAL HIGH (ref 70–99)
Potassium: 3.3 mEq/L — ABNORMAL LOW (ref 3.7–5.3)
Sodium: 142 mEq/L (ref 137–147)
Total Bilirubin: 0.3 mg/dL (ref 0.3–1.2)
Total Protein: 7.2 g/dL (ref 6.0–8.3)

## 2014-01-05 LAB — LIPASE, BLOOD: Lipase: 48 U/L (ref 11–59)

## 2014-01-05 MED ORDER — ONDANSETRON HCL 4 MG/2ML IJ SOLN
4.0000 mg | Freq: Once | INTRAMUSCULAR | Status: DC
  Filled 2014-01-05 (×2): qty 2

## 2014-01-05 MED ORDER — METOCLOPRAMIDE HCL 5 MG/ML IJ SOLN
10.0000 mg | Freq: Once | INTRAMUSCULAR | Status: AC
  Administered 2014-01-05: 10 mg via INTRAVENOUS
  Filled 2014-01-05: qty 2

## 2014-01-05 MED ORDER — MORPHINE SULFATE 4 MG/ML IJ SOLN
4.0000 mg | Freq: Once | INTRAMUSCULAR | Status: AC
  Administered 2014-01-05: 4 mg via INTRAVENOUS
  Filled 2014-01-05: qty 1

## 2014-01-05 MED ORDER — POTASSIUM CHLORIDE CRYS ER 20 MEQ PO TBCR
40.0000 meq | EXTENDED_RELEASE_TABLET | Freq: Once | ORAL | Status: AC
  Administered 2014-01-05: 40 meq via ORAL
  Filled 2014-01-05: qty 2

## 2014-01-05 MED ORDER — SODIUM CHLORIDE 0.9 % IV BOLUS (SEPSIS)
1000.0000 mL | Freq: Once | INTRAVENOUS | Status: AC
  Administered 2014-01-05: 1000 mL via INTRAVENOUS

## 2014-01-05 NOTE — Discharge Instructions (Signed)
Read the information below.  You may return to the Emergency Department at any time for worsening condition or any new symptoms that concern you.  If you develop high fevers that do not resolve with tylenol or ibuprofen, you have difficulty swallowing or breathing, or you are unable to tolerate fluids by mouth, return to the ER for a recheck.   If you develop fevers, loss of control of bowel or bladder, weakness or numbness in your legs, or are unable to walk, return to the ER for a recheck.   Viral Infections A viral infection can be caused by different types of viruses.Most viral infections are not serious and resolve on their own. However, some infections may cause severe symptoms and may lead to further complications. SYMPTOMS Viruses can frequently cause:  Minor sore throat.  Aches and pains.  Headaches.  Runny nose.  Different types of rashes.  Watery eyes.  Tiredness.  Cough.  Loss of appetite.  Gastrointestinal infections, resulting in nausea, vomiting, and diarrhea. These symptoms do not respond to antibiotics because the infection is not caused by bacteria. However, you might catch a bacterial infection following the viral infection. This is sometimes called a "superinfection." Symptoms of such a bacterial infection may include:  Worsening sore throat with pus and difficulty swallowing.  Swollen neck glands.  Chills and a high or persistent fever.  Severe headache.  Tenderness over the sinuses.  Persistent overall ill feeling (malaise), muscle aches, and tiredness (fatigue).  Persistent cough.  Yellow, green, or brown mucus production with coughing. HOME CARE INSTRUCTIONS   Only take over-the-counter or prescription medicines for pain, discomfort, diarrhea, or fever as directed by your caregiver.  Drink enough water and fluids to keep your urine clear or pale yellow. Sports drinks can provide valuable electrolytes, sugars, and hydration.  Get plenty of  rest and maintain proper nutrition. Soups and broths with crackers or rice are fine. SEEK IMMEDIATE MEDICAL CARE IF:   You have severe headaches, shortness of breath, chest pain, neck pain, or an unusual rash.  You have uncontrolled vomiting, diarrhea, or you are unable to keep down fluids.  You or your child has an oral temperature above 102 F (38.9 C), not controlled by medicine.  Your baby is older than 3 months with a rectal temperature of 102 F (38.9 C) or higher.  Your baby is 22 months old or younger with a rectal temperature of 100.4 F (38 C) or higher. MAKE SURE YOU:   Understand these instructions.  Will watch your condition.  Will get help right away if you are not doing well or get worse. Document Released: 08/10/2005 Document Revised: 01/23/2012 Document Reviewed: 03/07/2011 [facility] Patient Information 2014 [place], [place].

## 2014-01-05 NOTE — ED Provider Notes (Signed)
CSN: [id number]     Arrival date & time 01/05/14  7371 History   First MD Initiated Contact with Patient 01/05/14 [phone]     Chief Complaint  Patient presents with  . Generalized Body Aches     (Consider location/radiation/quality/duration/timing/severity/associated sxs/prior Treatment) The history is provided by the patient.   Patient reports she has been sick on and off for one month in his develop symptoms again since 5 days ago. States that she has a somewhat productive cough yellow nasal discharge and was given a and steroids by her OB/GYN for this. States that she feels it is improving. However, she has full body aches, particularly in her back.  Pain is aching and is exacerbated by movement, is diffuse and there is no focal point of tenderness.  This has been ongoing, off and on, the entire month.  She is taking percocet 10s and phenergan with little relief. Patient has a 40 and a 34 year old at home who have also been intermittently sick. Prior to the z-pak, patient took a full course of Augmentin for her symptoms and has also completely a course of Tamiflu.  Denies urinary symptoms, abnormal vaginal discharge or bleeding, extremity swelling, SOB, sore throat, sinus pressure, nasal congestion.    Past Medical History  Diagnosis Date  . Preterm labor     c/s at 36 wks  . Placenta previa     hx and with current pregnancy  . Migraines     last one over 1 month ago  . Anxiety     no meds  . Depression     no meds  . Asthma   . ADHD (attention deficit hyperactivity disorder)   . Scoliosis    Past Surgical History  Procedure Laterality Date  . Cesarean section  2010    x 1 at 36 wks in [place]  . Svd  2003    x 1 in [place]  . Wisdom tooth extraction    . Tubal ligation     Family History  Problem Relation Age of Onset  . Cancer Mother   . Hypertension Sister   . Sickle cell trait Daughter   . Asthma Son   . Asthma Paternal Grandmother   . Pseudochol deficiency Neg Hx   .  Malignant hyperthermia Neg Hx   . Anesthesia problems Neg Hx   . Hypotension Neg Hx    History  Substance Use Topics  . Smoking status: Current Every Day Smoker -- 0.20 packs/day for 12 years    Types: Cigarettes  . Smokeless tobacco: Never Used  . Alcohol Use: No     Comment: PReviously drank on occ   OB History   Grav Para Term Preterm Abortions TAB SAB Ect Mult Living   3 3 1 2  0 0 0 0 0 3     Review of Systems  Constitutional: Negative for fever, chills and diaphoresis.  HENT: Positive for rhinorrhea. Negative for congestion, sinus pressure, sore throat and trouble swallowing.   Respiratory: Positive for cough. Negative for shortness of breath.   Cardiovascular: Negative for chest pain.  Gastrointestinal: Positive for nausea and vomiting (mostly posttussive emesis). Negative for abdominal pain and diarrhea.  Genitourinary: Negative for dysuria, urgency, frequency, vaginal bleeding, vaginal discharge and menstrual problem.  Musculoskeletal: Negative for neck stiffness.  Allergic/Immunologic: Negative for immunocompromised state.  Neurological: Negative for weakness and numbness.  Hematological: Does not bruise/bleed easily.      Allergies  Review of patient's allergies indicates  no known allergies.  Home Medications   Current Outpatient Rx  Name  Route  Sig  Dispense  Refill  . albuterol (PROVENTIL HFA;VENTOLIN HFA) 108 (90 BASE) MCG/ACT inhaler   Inhalation   Inhale 2 puffs into the lungs every 4 (four) hours as needed for wheezing or shortness of breath.   1 Inhaler   0   . azithromycin (ZITHROMAX) 250 MG tablet   Oral   Take 250 mg by mouth daily. 4 day therapy course patient has completed 2 days of the therapy.         Marland Kitchen HYDROcodone-homatropine (HYCODAN) 5-1.5 MG/5ML syrup   Oral   Take 5 mLs by mouth every 6 (six) hours as needed for cough.   30 mL   0   . ibuprofen (ADVIL,MOTRIN) 600 MG tablet   Oral   Take 600 mg by mouth every 6 (six) hours as  needed (for pain.).         Marland Kitchen levonorgestrel (MIRENA) 20 MCG/24HR IUD   Intrauterine   1 each by Intrauterine route continuous.          . mometasone (NASONEX) 50 MCG/ACT nasal spray   Nasal   Place 2 sprays into the nose daily.   17 g   12   . oxyCODONE-acetaminophen (PERCOCET) 10-325 MG per tablet   Oral   Take 1 tablet by mouth every 4 (four) hours as needed for pain.         . predniSONE (DELTASONE) 20 MG tablet   Oral   Take 2 tablets (40 mg total) by mouth daily.   10 tablet   0   . promethazine (PHENERGAN) 25 MG tablet   Oral   Take 25 mg by mouth every 6 (six) hours as needed for nausea or vomiting.          BP 133/81  Pulse 74  Temp(Src) 98.3 F (36.8 C) (Oral)  Resp 16  Wt 186 lb (84.369 kg)  SpO2 98% Physical Exam  Nursing note and vitals reviewed. Constitutional: She appears well-developed and well-nourished. No distress.  HENT:  Head: Normocephalic and atraumatic.  Neck: Normal range of motion. Neck supple.  Cardiovascular: Normal rate and regular rhythm.   Pulmonary/Chest: Effort normal and breath sounds normal. No stridor. No respiratory distress. She has no wheezes. She has no rales.  Abdominal: Soft. She exhibits no distension. There is generalized tenderness. There is no rebound and no guarding.  Neurological: She is alert.  Skin: She is not diaphoretic.    ED Course  Procedures (including critical care time) Labs Review Labs Reviewed  COMPREHENSIVE METABOLIC PANEL - Abnormal; Notable for the following:    Potassium 3.3 (*)    Glucose, Bld 106 (*)    BUN 5 (*)    All other components within normal limits  CBC WITH DIFFERENTIAL  LIPASE, BLOOD   Imaging Review No results found.  EKG Interpretation   None       MDM   Final diagnoses:  Myalgia    Afebrile, nontoxic patient with constellation of symptoms suggestive of viral syndrome with myalgias. Pt has small children at home that have also been intermittently sick,  likely passing it back and forth.  No concerning findings on exam.  Discharged home with supportive care, PCP follow up. Discussed result, findings, treatment, and follow up  with patient.  Pt given return precautions.  Pt verbalizes understanding and agrees with plan.        Clayton Bibles, PA-C  01/05/14 1527 

## 2014-01-05 NOTE — ED Notes (Signed)
She remains in no distress; and is c/o nausea.  Different med. Order obtained and given.  Warm blanket given per request also.

## 2014-01-05 NOTE — ED Provider Notes (Signed)
Medical screening examination/treatment/procedure(s) were performed by non-physician practitioner and as supervising physician I was immediately available for consultation/collaboration.  EKG Interpretation   None         Neta Ehlers, MD 01/05/14 1725

## 2014-01-05 NOTE — ED Notes (Addendum)
Pt reports that she has been sick since November, her children have also been sick, pt reports that she has been seen here multiple times since then, and been placed on multiple prescriptions, without relief of generalized body aches, n/v, or cough. Pt states she has taken 10 mg Percocet q4h without any relief of pain. Pt reports x4 episodes of vomiting in the past 24 hours which has given her a sore throat. Pt a&o x4, NAD noted at this time

## 2014-01-07 ENCOUNTER — Emergency Department (HOSPITAL_COMMUNITY)
Admission: EM | Admit: 2014-01-07 | Discharge: 2014-01-07 | Disposition: A | Discharge status: Home / Self Care | Payer: Medicaid Other | Attending: Emergency Medicine | Admitting: Emergency Medicine

## 2014-01-07 ENCOUNTER — Encounter (HOSPITAL_COMMUNITY): Payer: Self-pay | Admitting: Emergency Medicine

## 2014-01-07 DIAGNOSIS — H53149 Visual discomfort, unspecified: Secondary | ICD-10-CM | POA: Insufficient documentation

## 2014-01-07 DIAGNOSIS — Z79899 Other long term (current) drug therapy: Secondary | ICD-10-CM | POA: Insufficient documentation

## 2014-01-07 DIAGNOSIS — Z3202 Encounter for pregnancy test, result negative: Secondary | ICD-10-CM | POA: Insufficient documentation

## 2014-01-07 DIAGNOSIS — Z8659 Personal history of other mental and behavioral disorders: Secondary | ICD-10-CM | POA: Insufficient documentation

## 2014-01-07 DIAGNOSIS — R110 Nausea: Secondary | ICD-10-CM | POA: Insufficient documentation

## 2014-01-07 DIAGNOSIS — R519 Headache, unspecified: Secondary | ICD-10-CM

## 2014-01-07 DIAGNOSIS — M79609 Pain in unspecified limb: Secondary | ICD-10-CM | POA: Insufficient documentation

## 2014-01-07 DIAGNOSIS — Z8679 Personal history of other diseases of the circulatory system: Secondary | ICD-10-CM | POA: Insufficient documentation

## 2014-01-07 DIAGNOSIS — J45909 Unspecified asthma, uncomplicated: Secondary | ICD-10-CM | POA: Insufficient documentation

## 2014-01-07 DIAGNOSIS — M542 Cervicalgia: Secondary | ICD-10-CM | POA: Insufficient documentation

## 2014-01-07 DIAGNOSIS — F17200 Nicotine dependence, unspecified, uncomplicated: Secondary | ICD-10-CM | POA: Insufficient documentation

## 2014-01-07 DIAGNOSIS — R51 Headache: Secondary | ICD-10-CM | POA: Insufficient documentation

## 2014-01-07 DIAGNOSIS — IMO0002 Reserved for concepts with insufficient information to code with codable children: Secondary | ICD-10-CM | POA: Insufficient documentation

## 2014-01-07 LAB — CBC WITH DIFFERENTIAL/PLATELET
Basophils Absolute: 0 10*3/uL (ref 0.0–0.1)
Basophils Relative: 0 % (ref 0–1)
Eosinophils Absolute: 0.2 10*3/uL (ref 0.0–0.7)
Eosinophils Relative: 3 % (ref 0–5)
HCT: 42.5 % (ref 36.0–46.0)
Hemoglobin: 14.8 g/dL (ref 12.0–15.0)
Lymphocytes Relative: 30 % (ref 12–46)
Lymphs Abs: 2.7 10*3/uL (ref 0.7–4.0)
MCH: 28.8 pg (ref 26.0–34.0)
MCHC: 34.8 g/dL (ref 30.0–36.0)
MCV: 82.7 fL (ref 78.0–100.0)
Monocytes Absolute: 0.5 10*3/uL (ref 0.1–1.0)
Monocytes Relative: 5 % (ref 3–12)
Neutro Abs: 5.6 10*3/uL (ref 1.7–7.7)
Neutrophils Relative %: 62 % (ref 43–77)
Platelets: 401 10*3/uL — ABNORMAL HIGH (ref 150–400)
RBC: 5.14 MIL/uL — ABNORMAL HIGH (ref 3.87–5.11)
RDW: 13.7 % (ref 11.5–15.5)
WBC: 9 10*3/uL (ref 4.0–10.5)

## 2014-01-07 LAB — URINALYSIS, ROUTINE W REFLEX MICROSCOPIC
Bilirubin Urine: NEGATIVE
Glucose, UA: NEGATIVE mg/dL
Ketones, ur: 15 mg/dL — AB
Leukocytes, UA: NEGATIVE
Nitrite: NEGATIVE
Protein, ur: NEGATIVE mg/dL
Specific Gravity, Urine: 1.024 (ref 1.005–1.030)
Urobilinogen, UA: 1 mg/dL (ref 0.0–1.0)
pH: 7 (ref 5.0–8.0)

## 2014-01-07 LAB — COMPREHENSIVE METABOLIC PANEL
ALT: 12 U/L (ref 0–35)
AST: 14 U/L (ref 0–37)
Albumin: 4.2 g/dL (ref 3.5–5.2)
Alkaline Phosphatase: 75 U/L (ref 39–117)
BUN: 7 mg/dL (ref 6–23)
CO2: 24 mEq/L (ref 19–32)
Calcium: 9.7 mg/dL (ref 8.4–10.5)
Chloride: 100 mEq/L (ref 96–112)
Creatinine, Ser: 0.58 mg/dL (ref 0.50–1.10)
GFR calc Af Amer: >90 mL/min (ref >90)
GFR calc non Af Amer: >90 mL/min (ref >90)
Glucose, Bld: 88 mg/dL (ref 70–99)
Potassium: 3.8 mEq/L (ref 3.7–5.3)
Sodium: 139 mEq/L (ref 137–147)
Total Bilirubin: 1 mg/dL (ref 0.3–1.2)
Total Protein: 8.2 g/dL (ref 6.0–8.3)

## 2014-01-07 LAB — URINE MICROSCOPIC-ADD ON

## 2014-01-07 LAB — CK: Total CK: 52 U/L (ref 7–177)

## 2014-01-07 LAB — POC URINE PREG, ED: Preg Test, Ur: NEGATIVE

## 2014-01-07 MED ORDER — SODIUM CHLORIDE 0.9 % IV BOLUS (SEPSIS)
1000.0000 mL | Freq: Once | INTRAVENOUS | Status: AC
  Administered 2014-01-07: 1000 mL via INTRAVENOUS

## 2014-01-07 MED ORDER — METOCLOPRAMIDE HCL 5 MG/ML IJ SOLN
10.0000 mg | Freq: Once | INTRAMUSCULAR | Status: AC
  Administered 2014-01-07: 10 mg via INTRAVENOUS
  Filled 2014-01-07: qty 2

## 2014-01-07 MED ORDER — PROMETHAZINE HCL 25 MG RE SUPP: 25.0000 mg | Freq: Four times a day (QID) | RECTAL | Status: DC | PRN

## 2014-01-07 MED ORDER — DIPHENHYDRAMINE HCL 50 MG/ML IJ SOLN
25.0000 mg | Freq: Once | INTRAMUSCULAR | Status: AC
  Administered 2014-01-07: 25 mg via INTRAVENOUS
  Filled 2014-01-07: qty 1

## 2014-01-07 NOTE — ED Provider Notes (Signed)
CSN: [id number]     Arrival date & time 01/07/14  1556 History   First MD Initiated Contact with Patient 01/07/14 1615     Chief Complaint  Patient presents with  . Nausea  . Migraine     (Consider location/radiation/quality/duration/timing/severity/associated sxs/prior Treatment) HPI Comments: 33 year old female presents with primary complaint of headache. She's been on for a week on and off. The most recent episode started yesterday and has been gradually getting worse throughout the day. This feels like the severe migraine she's had. She states the pain is currently a the right side of her head. It started off on the left side of her head is migrated over. She feels that someone is hitting her with a hammer. She has photophobia but no visual changes. Her neck hurts but is not stiff. No fevers or chills. She states she's been taking Percocet and Phenergan at home without relief. No weakness. Patient states she's been sick for several weeks, with diffuse myalgias, nausea, and vomiting. She states last time she felt as bad as when she was pregnant. She's not checked a pregnancy test. Denies any urinary symptoms or vaginal symptoms. No abdominal pain or chest pain. The myalgias are in her extremities.   Past Medical History  Diagnosis Date  . Preterm labor     c/s at 36 wks  . Placenta previa     hx and with current pregnancy  . Migraines     last one over 1 month ago  . Anxiety     no meds  . Depression     no meds  . Asthma   . ADHD (attention deficit hyperactivity disorder)   . Scoliosis    Past Surgical History  Procedure Laterality Date  . Cesarean section  2010    x 1 at 36 wks in [place]  . Svd  2003    x 1 in [place]  . Wisdom tooth extraction    . Tubal ligation     Family History  Problem Relation Age of Onset  . Cancer Mother   . Hypertension Sister   . Sickle cell trait Daughter   . Asthma Son   . Asthma Paternal Grandmother   . Pseudochol deficiency Neg Hx   .  Malignant hyperthermia Neg Hx   . Anesthesia problems Neg Hx   . Hypotension Neg Hx    History  Substance Use Topics  . Smoking status: Current Every Day Smoker -- 0.20 packs/day for 12 years    Types: Cigarettes  . Smokeless tobacco: Never Used  . Alcohol Use: No     Comment: PReviously drank on occ   OB History   Grav Para Term Preterm Abortions TAB SAB Ect Mult Living   3 3 1 2  0 0 0 0 0 3     Review of Systems  Constitutional: Negative for fever.  HENT: Negative for sore throat.   Eyes: Positive for photophobia. Negative for visual disturbance.  Respiratory: Negative for cough and shortness of breath.   Cardiovascular: Negative for chest pain.  Gastrointestinal: Negative for vomiting and abdominal pain.  Genitourinary: Negative for dysuria.  Musculoskeletal: Positive for neck pain.       Leg pain  Neurological: Positive for headaches. Negative for weakness.  All other systems reviewed and are negative.      Allergies  Review of patient's allergies indicates no known allergies.  Home Medications   Current Outpatient Rx  Name  Route  Sig  Dispense  Refill  .  albuterol (PROVENTIL HFA;VENTOLIN HFA) 108 (90 BASE) MCG/ACT inhaler   Inhalation   Inhale 2 puffs into the lungs every 4 (four) hours as needed for wheezing or shortness of breath.   1 Inhaler   0   . ibuprofen (ADVIL,MOTRIN) 600 MG tablet   Oral   Take 600 mg by mouth every 6 (six) hours as needed (for pain.).         Marland Kitchen. levonorgestrel (MIRENA) 20 MCG/24HR IUD   Intrauterine   1 each by Intrauterine route continuous.          . mometasone (NASONEX) 50 MCG/ACT nasal spray   Nasal   Place 2 sprays into the nose daily.   17 g   12   . oxyCODONE-acetaminophen (PERCOCET) 10-325 MG per tablet   Oral   Take 1 tablet by mouth every 4 (four) hours as needed for pain.         . promethazine (PHENERGAN) 25 MG tablet   Oral   Take 25 mg by mouth every 6 (six) hours as needed for nausea or  vomiting.          BP 132/74  Pulse 84  Resp 18  SpO2 98% Physical Exam  Nursing note and vitals reviewed. Constitutional: She is oriented to person, place, and time. She appears well-developed and well-nourished.  HENT:  Head: Normocephalic and atraumatic.  Right Ear: External ear normal.  Left Ear: External ear normal.  Nose: Nose normal.  Eyes: EOM are normal. Pupils are equal, round, and reactive to light. Right eye exhibits no discharge. Left eye exhibits no discharge.  photophobic  Neck: Normal range of motion. Neck supple.  Cardiovascular: Normal rate, regular rhythm and normal heart sounds.   Pulmonary/Chest: Effort normal and breath sounds normal.  Abdominal: Soft. She exhibits no distension. There is no tenderness.  Neurological: She is alert and oriented to person, place, and time. She has normal strength. No cranial nerve deficit or sensory deficit. She exhibits normal muscle tone. Gait normal. GCS eye subscore is 4. GCS verbal subscore is 5. GCS motor subscore is 6.  CN 2-12 grossly intact. 5/5 strength in all 4 extremities. Normal gait  Skin: Skin is warm and dry.    ED Course  Procedures (including critical care time) Labs Review Labs Reviewed  CBC WITH DIFFERENTIAL - Abnormal; Notable for the following:    RBC 5.14 (*)    Platelets 401 (*)    All other components within normal limits  URINALYSIS, ROUTINE W REFLEX MICROSCOPIC - Abnormal; Notable for the following:    Color, Urine AMBER (*)    APPearance CLOUDY (*)    Hgb urine dipstick TRACE (*)    Ketones, ur 15 (*)    All other components within normal limits  URINE MICROSCOPIC-ADD ON - Abnormal; Notable for the following:    Squamous Epithelial / LPF FEW (*)    Bacteria, UA FEW (*)    All other components within normal limits  COMPREHENSIVE METABOLIC PANEL  CK  POC URINE PREG, ED   Imaging Review No results found.  EKG Interpretation   None       MDM   Final diagnoses:  Headache     Patient's exam is normal, as above. Her neurologic exam is normal including gait. She feels symptomatically improved after Reglan and Benadryl and fluids. I doubt serious intracranial pathology such as meningitis, encephalitis, blood clots, or bleed. She had a benign CT of her head a couple months ago and  I feel mass would be less likely. Her headache was gradual in onset and has been continuing to worsen and thus I feel subarachnoid is also low concern. The patient has Percocet and Phenergan at home with her vomiting we will give Phenergan suppository. She does not appear dehydrated. Her abdominal and chest exams are benign. She has no evidence of an electrolyte abnormality. I feel at this time she is stable for discharge and recommended she followup with her PCP soon as possible given her prolonged symptoms.    Ephraim Hamburger, MD 01/07/14 [phone]

## 2014-01-07 NOTE — ED Notes (Signed)
Pt was just seen on 2/22 for same complaints. Per EMS: pt c/o migraine and nausea.

## 2014-01-08 NOTE — ED Provider Notes (Signed)
CSN: [id number]     Arrival date & time 12/31/13  1815 History   First MD Initiated Contact with Patient 12/31/13 1904     Chief Complaint  Patient presents with  . Emesis  . Sore Throat  . Chest congestion      (Consider location/radiation/quality/duration/timing/severity/associated sxs/prior Treatment) HPI  33yF with numerous complaints. Feels like has been "sick" for past month. Symptoms wax and wane. Intermittent HA, sore throat, cough, fatigue, congestion. Feels like nothing prescribed to her has worked. No urinary complaints. No abdominal pain. Mild nausea. No vomiting. No diarrhea. Subjective fever and chills at times.   Past Medical History  Diagnosis Date  . Preterm labor     c/s at 36 wks  . Placenta previa     hx and with current pregnancy  . Migraines     last one over 1 month ago  . Anxiety     no meds  . Depression     no meds  . Asthma   . ADHD (attention deficit hyperactivity disorder)   . Scoliosis    Past Surgical History  Procedure Laterality Date  . Cesarean section  2010    x 1 at 36 wks in [place]  . Svd  2003    x 1 in [place]  . Wisdom tooth extraction    . Tubal ligation     Family History  Problem Relation Age of Onset  . Cancer Mother   . Hypertension Sister   . Sickle cell trait Daughter   . Asthma Son   . Asthma Paternal Grandmother   . Pseudochol deficiency Neg Hx   . Malignant hyperthermia Neg Hx   . Anesthesia problems Neg Hx   . Hypotension Neg Hx    History  Substance Use Topics  . Smoking status: Current Every Day Smoker -- 0.20 packs/day for 12 years    Types: Cigarettes  . Smokeless tobacco: Never Used  . Alcohol Use: No     Comment: PReviously drank on occ   OB History   Grav Para Term Preterm Abortions TAB SAB Ect Mult Living   3 3 1 2  0 0 0 0 0 3     Review of Systems  All systems reviewed and negative, other than as noted in HPI.   Allergies  Review of patient's allergies indicates no known  allergies.  Home Medications   Current Outpatient Rx  Name  Route  Sig  Dispense  Refill  . albuterol (PROVENTIL HFA;VENTOLIN HFA) 108 (90 BASE) MCG/ACT inhaler   Inhalation   Inhale 2 puffs into the lungs every 4 (four) hours as needed for wheezing or shortness of breath.   1 Inhaler   0   . mometasone (NASONEX) 50 MCG/ACT nasal spray   Nasal   Place 2 sprays into the nose daily.   17 g   12   . promethazine (PHENERGAN) 25 MG tablet   Oral   Take 25 mg by mouth every 6 (six) hours as needed for nausea or vomiting.         Marland Kitchen ibuprofen (ADVIL,MOTRIN) 600 MG tablet   Oral   Take 600 mg by mouth every 6 (six) hours as needed (for pain.).         Marland Kitchen levonorgestrel (MIRENA) 20 MCG/24HR IUD   Intrauterine   1 each by Intrauterine route continuous.          Marland Kitchen oxyCODONE-acetaminophen (PERCOCET) 10-325 MG per tablet   Oral   Take  1 tablet by mouth every 4 (four) hours as needed for pain.         . promethazine (PHENERGAN) 25 MG suppository   Rectal   Place 1 suppository (25 mg total) rectally every 6 (six) hours as needed for nausea or vomiting.   15 each   0    BP 110/80  Pulse 72  Temp(Src) 98 F (36.7 C) (Oral)  Resp 16  SpO2 98%  LMP 12/03/2013 Physical Exam  Nursing note and vitals reviewed. Constitutional: She is oriented to person, place, and time. She appears well-developed and well-nourished. No distress.  HENT:  Head: Normocephalic and atraumatic.  Right Ear: External ear normal.  Left Ear: External ear normal.  Mouth/Throat: Oropharynx is clear and moist.  Eyes: Conjunctivae are normal. Pupils are equal, round, and reactive to light. Right eye exhibits no discharge. Left eye exhibits no discharge.  Neck: Neck supple.  Cardiovascular: Normal rate, regular rhythm and normal heart sounds.  Exam reveals no gallop and no friction rub.   No murmur heard. Pulmonary/Chest: Effort normal and breath sounds normal. No respiratory distress.  Abdominal: Soft.  She exhibits no distension. There is no tenderness.  Musculoskeletal: She exhibits no edema and no tenderness.  Neurological: She is alert and oriented to person, place, and time.  Skin: Skin is warm and dry.  Psychiatric: She has a normal mood and affect. Her behavior is normal. Thought content normal.    ED Course  Procedures (including critical care time) Labs Review Labs Reviewed - No data to display Imaging Review No results found.  EKG Interpretation   None       MDM   Final diagnoses:  Viral illness    33 year old female with multiple complaints. Essentially URI type symptoms. Congestion. Nonfocal exam. Patient is assisting on azithromycin. Discussed with patient that I feel strongly that this is not a bacterial process. Recommended treatment with decongestants and steroids. Eventually consider to give a course of azithromycin but instructed not to take unless her symptoms do not begin to improve in 2-3 days. Emergent return precautions were discussed. Outpatient followup otherwise.    Virgel Manifold, MD 01/08/14 [phone]

## 2014-01-11 ENCOUNTER — Emergency Department (HOSPITAL_COMMUNITY)
Admission: EM | Admit: 2014-01-11 | Discharge: 2014-01-11 | Disposition: A | Discharge status: Home / Self Care | Payer: Medicaid Other | Attending: Emergency Medicine | Admitting: Emergency Medicine

## 2014-01-11 ENCOUNTER — Encounter (HOSPITAL_COMMUNITY): Payer: Self-pay | Admitting: Emergency Medicine

## 2014-01-11 DIAGNOSIS — J45909 Unspecified asthma, uncomplicated: Secondary | ICD-10-CM | POA: Insufficient documentation

## 2014-01-11 DIAGNOSIS — G43909 Migraine, unspecified, not intractable, without status migrainosus: Secondary | ICD-10-CM | POA: Insufficient documentation

## 2014-01-11 DIAGNOSIS — Z8659 Personal history of other mental and behavioral disorders: Secondary | ICD-10-CM | POA: Insufficient documentation

## 2014-01-11 DIAGNOSIS — Z79899 Other long term (current) drug therapy: Secondary | ICD-10-CM | POA: Insufficient documentation

## 2014-01-11 DIAGNOSIS — IMO0002 Reserved for concepts with insufficient information to code with codable children: Secondary | ICD-10-CM | POA: Insufficient documentation

## 2014-01-11 DIAGNOSIS — Z8739 Personal history of other diseases of the musculoskeletal system and connective tissue: Secondary | ICD-10-CM | POA: Insufficient documentation

## 2014-01-11 DIAGNOSIS — F17200 Nicotine dependence, unspecified, uncomplicated: Secondary | ICD-10-CM | POA: Insufficient documentation

## 2014-01-11 LAB — COMPREHENSIVE METABOLIC PANEL
ALT: 13 U/L (ref 0–35)
AST: 16 U/L (ref 0–37)
Albumin: 3.7 g/dL (ref 3.5–5.2)
Alkaline Phosphatase: 79 U/L (ref 39–117)
BUN: 11 mg/dL (ref 6–23)
CO2: 24 mEq/L (ref 19–32)
Calcium: 9.1 mg/dL (ref 8.4–10.5)
Chloride: 102 mEq/L (ref 96–112)
Creatinine, Ser: 0.61 mg/dL (ref 0.50–1.10)
GFR calc Af Amer: >90 mL/min (ref >90)
GFR calc non Af Amer: >90 mL/min (ref >90)
Glucose, Bld: 108 mg/dL — ABNORMAL HIGH (ref 70–99)
Potassium: 3.4 mEq/L — ABNORMAL LOW (ref 3.7–5.3)
Sodium: 140 mEq/L (ref 137–147)
Total Bilirubin: 0.4 mg/dL (ref 0.3–1.2)
Total Protein: 7 g/dL (ref 6.0–8.3)

## 2014-01-11 LAB — CBC WITH DIFFERENTIAL/PLATELET
Basophils Absolute: 0 10*3/uL (ref 0.0–0.1)
Basophils Relative: 0 % (ref 0–1)
Eosinophils Absolute: 0.3 10*3/uL (ref 0.0–0.7)
Eosinophils Relative: 4 % (ref 0–5)
HCT: 40.2 % (ref 36.0–46.0)
Hemoglobin: 13.4 g/dL (ref 12.0–15.0)
Lymphocytes Relative: 29 % (ref 12–46)
Lymphs Abs: 2.1 10*3/uL (ref 0.7–4.0)
MCH: 28.2 pg (ref 26.0–34.0)
MCHC: 33.3 g/dL (ref 30.0–36.0)
MCV: 84.5 fL (ref 78.0–100.0)
Monocytes Absolute: 0.5 10*3/uL (ref 0.1–1.0)
Monocytes Relative: 7 % (ref 3–12)
Neutro Abs: 4.3 10*3/uL (ref 1.7–7.7)
Neutrophils Relative %: 59 % (ref 43–77)
Platelets: 315 10*3/uL (ref 150–400)
RBC: 4.76 MIL/uL (ref 3.87–5.11)
RDW: 13.9 % (ref 11.5–15.5)
WBC: 7.3 10*3/uL (ref 4.0–10.5)

## 2014-01-11 MED ORDER — METOCLOPRAMIDE HCL 5 MG/ML IJ SOLN
10.0000 mg | Freq: Once | INTRAMUSCULAR | Status: AC
  Administered 2014-01-11: 10 mg via INTRAVENOUS
  Filled 2014-01-11: qty 2

## 2014-01-11 MED ORDER — SODIUM CHLORIDE 0.9 % IV SOLN: 1000.0000 mL | INTRAVENOUS | Status: DC

## 2014-01-11 MED ORDER — KETOROLAC TROMETHAMINE 15 MG/ML IJ SOLN
30.0000 mg | Freq: Once | INTRAMUSCULAR | Status: AC
  Administered 2014-01-11: 30 mg via INTRAVENOUS
  Filled 2014-01-11: qty 2

## 2014-01-11 MED ORDER — SODIUM CHLORIDE 0.9 % IV SOLN
1000.0000 mL | Freq: Once | INTRAVENOUS | Status: AC
Start: 2014-01-11 — End: 2014-01-11
  Administered 2014-01-11: 1000 mL via INTRAVENOUS

## 2014-01-11 MED ORDER — DEXAMETHASONE SODIUM PHOSPHATE 10 MG/ML IJ SOLN
10.0000 mg | Freq: Once | INTRAMUSCULAR | Status: AC
  Administered 2014-01-11: 10 mg via INTRAVENOUS
  Filled 2014-01-11: qty 1

## 2014-01-11 MED ORDER — METOCLOPRAMIDE HCL 10 MG PO TABS: 10.0000 mg | ORAL_TABLET | Freq: Four times a day (QID) | ORAL | Status: DC | PRN

## 2014-01-11 MED ORDER — DIPHENHYDRAMINE HCL 50 MG/ML IJ SOLN
25.0000 mg | Freq: Once | INTRAMUSCULAR | Status: AC
  Administered 2014-01-11: 25 mg via INTRAVENOUS
  Filled 2014-01-11: qty 1

## 2014-01-11 NOTE — ED Notes (Signed)
Pt arrived via EMS with a complaint of a migraine.  Pt has a hx of same.  Pt usually takes percocet 10 mg but has run out and need to contact her PCP.  Pt has taken ibuprofen without relief.  Pt states her pain is located on her whole head.  Pt also states it hurts ot move her neck or limbs as it causes her pain.

## 2014-01-11 NOTE — ED Notes (Signed)
Not finished pushing the IV toradol when noticed that pt is breathing deeply and evenly. Pt is asleep.

## 2014-01-11 NOTE — Discharge Instructions (Signed)
Migraine Headache A migraine headache is an intense, throbbing pain on one or both sides of your head. A migraine can last for 30 minutes to several hours. CAUSES  The exact cause of a migraine headache is not always known. However, a migraine may be caused when nerves in the brain become irritated and release chemicals that cause inflammation. This causes pain. Certain things may also trigger migraines, such as:  Alcohol.  Smoking.  Stress.  Menstruation.  Aged cheeses.  Foods or drinks that contain nitrates, glutamate, aspartame, or tyramine.  Lack of sleep.  Chocolate.  Caffeine.  Hunger.  Physical exertion.  Fatigue.  Medicines used to treat chest pain (nitroglycerine), birth control pills, estrogen, and some blood pressure medicines. SIGNS AND SYMPTOMS  Pain on one or both sides of your head.  Pulsating or throbbing pain.  Severe pain that prevents daily activities.  Pain that is aggravated by any physical activity.  Nausea, vomiting, or both.  Dizziness.  Pain with exposure to bright lights, loud noises, or activity.  General sensitivity to bright lights, loud noises, or smells. Before you get a migraine, you may get warning signs that a migraine is coming (aura). An aura may include:  Seeing flashing lights.  Seeing bright spots, halos, or zig-zag lines.  Having tunnel vision or blurred vision.  Having feelings of numbness or tingling.  Having trouble talking.  Having muscle weakness. DIAGNOSIS  A migraine headache is often diagnosed based on:  Symptoms.  Physical exam.  A CT scan or MRI of your head. These imaging tests cannot diagnose migraines, but they can help rule out other causes of headaches. TREATMENT Medicines may be given for pain and nausea. Medicines can also be given to help prevent recurrent migraines.  HOME CARE INSTRUCTIONS  Only take over-the-counter or prescription medicines for pain or discomfort as directed by your  health care provider. The use of long-term narcotics is not recommended.  Lie down in a dark, quiet room when you have a migraine.  Keep a journal to find out what may trigger your migraine headaches. For example, write down:  What you eat and drink.  How much sleep you get.  Any change to your diet or medicines.  Limit alcohol consumption.  Quit smoking if you smoke.  Get 7 9 hours of sleep, or as recommended by your health care provider.  Limit stress.  Keep lights dim if bright lights bother you and make your migraines worse. SEEK IMMEDIATE MEDICAL CARE IF:   Your migraine becomes severe.  You have a fever.  You have a stiff neck.  You have vision loss.  You have muscular weakness or loss of muscle control.  You start losing your balance or have trouble walking.  You feel faint or pass out.  You have severe symptoms that are different from your first symptoms. MAKE SURE YOU:   Understand these instructions.  Will watch your condition.  Will get help right away if you are not doing well or get worse. Document Released: 10/31/2005 Document Revised: 08/21/2013 Document Reviewed: 07/08/2013 [facility] Patient Information 2014 [place].  Metoclopramide tablets What is this medicine? METOCLOPRAMIDE (met oh kloe PRA mide) is used to treat the symptoms of gastroesophageal reflux disease (GERD) like heartburn. It is also used to treat people with slow emptying of the stomach and intestinal tract. This medicine may be used for other purposes; ask your health care provider or pharmacist if you have questions. COMMON BRAND NAME(S): Reglan What should I  tell my health care provider before I take this medicine? °They need to know if you have any of these conditions: °-breast cancer °-depression °-diabetes °-heart failure °-high blood pressure °-kidney disease °-liver disease °-Parkinson's disease or a movement disorder °-pheochromocytoma °-seizures °-stomach  obstruction, bleeding, or perforation °-an unusual or allergic reaction to metoclopramide, procainamide, sulfites, other medicines, foods, dyes, or preservatives °-pregnant or trying to get pregnant °-breast-feeding °How should I use this medicine? °Take this medicine by mouth with a glass of water. Follow the directions on the prescription label. Take this medicine on an empty stomach, about 30 minutes before eating. Take your doses at regular intervals. Do not take your medicine more often than directed. Do not stop taking except on the advice of your doctor or health care professional. °A special MedGuide will be given to you by the pharmacist with each prescription and refill. Be sure to read this information carefully each time. °Talk to your pediatrician regarding the use of this medicine in children. Special care may be needed. °Overdosage: If you think you have taken too much of this medicine contact a poison control center or emergency room at once. °NOTE: This medicine is only for you. Do not share this medicine with others. °What if I miss a dose? °If you miss a dose, take it as soon as you can. If it is almost time for your next dose, take only that dose. Do not take double or extra doses. °What may interact with this medicine? °-acetaminophen °-cyclosporine °-digoxin °-medicines for blood pressure °-medicines for diabetes, including insulin °-medicines for hay fever and other allergies °-medicines for depression, especially an Monoamine Oxidase Inhibitor (MAOI) °-medicines for Parkinson's disease, like levodopa °-medicines for sleep or for pain °-tetracycline °This list may not describe all possible interactions. Give your health care provider a list of all the medicines, herbs, non-prescription drugs, or dietary supplements you use. Also tell them if you smoke, drink alcohol, or use illegal drugs. Some items may interact with your medicine. °What should I watch for while using this medicine? °It may  take a few weeks for your stomach condition to start to get better. However, do not take this medicine for longer than 12 weeks. The longer you take this medicine, and the more you take it, the greater your chances are of developing serious side effects. If you are an elderly patient, a female patient, or you have diabetes, you may be at an increased risk for side effects from this medicine. Contact your doctor immediately if you start having movements you cannot control such as lip smacking, rapid movements of the tongue, involuntary or uncontrollable movements of the eyes, head, arms and legs, or muscle twitches and spasms. °Patients and their families should watch out for worsening depression or thoughts of suicide. Also watch out for any sudden or severe changes in feelings such as feeling anxious, agitated, panicky, irritable, hostile, aggressive, impulsive, severely restless, overly excited and hyperactive, or not being able to sleep. If this happens, especially at the beginning of treatment or after a change in dose, call your doctor. °Do not treat yourself for high fever. Ask your doctor or health care professional for advice. °You may get drowsy or dizzy. Do not drive, use machinery, or do anything that needs mental alertness until you know how this drug affects you. Do not stand or sit up quickly, especially if you are an older patient. This reduces the risk of dizzy or fainting spells. Alcohol can make you more   drowsy and dizzy. Avoid alcoholic drinks. What side effects may I notice from receiving this medicine? Side effects that you should report to your doctor or health care professional as soon as possible: -allergic reactions like skin rash, itching or hives, swelling of the face, lips, or tongue -abnormal production of milk in females -breast enlargement in both males and females -change in the way you walk -difficulty moving, speaking or swallowing -drooling, lip smacking, or rapid movements  of the tongue -excessive sweating -fever -involuntary or uncontrollable movements of the eyes, head, arms and legs -irregular heartbeat or palpitations -muscle twitches and spasms -unusually weak or tired Side effects that usually do not require medical attention (report to your doctor or health care professional if they continue or are bothersome): -change in sex drive or performance -depressed mood -diarrhea -difficulty sleeping -headache -menstrual changes -restless or nervous This list may not describe all possible side effects. Call your doctor for medical advice about side effects. You may report side effects to FDA at 1-800-FDA-1088. Where should I keep my medicine? Keep out of the reach of children. Store at room temperature between 20 and 25 degrees C (68 and 77 degrees F). Protect from light. Keep container tightly closed. Throw away any unused medicine after the expiration date. NOTE: This sheet is a summary. It may not cover all possible information. If you have questions about this medicine, talk to your doctor, pharmacist, or health care provider.  2014, [employer]. (2012-02-28 13:04:38)

## 2014-01-11 NOTE — ED Provider Notes (Signed)
CSN: [id number]     Arrival date & time 01/11/14  0343 History   First MD Initiated Contact with Patient 01/11/14 [phone]     Chief Complaint  Patient presents with  . Headache     (Consider location/radiation/quality/duration/timing/severity/associated sxs/prior Treatment) Patient is a 33 y.o. female presenting with headaches. The history is provided by the patient.  Headache She complains of a right parietal headache with radiation to the neck. Headache came on this evening and described as a throbbing pain which is severe and she rates it 10/10. It is worse with exposure to light. There is associated nausea but no vomiting. She denies photophobia. She states that she's been having headaches like this for about the last month and that they then spread to the neck, back, and all over her body. There is no weakness, numbness, tingling. She denies fever, chills, sweats. She had been taking oxycodone-acetaminophen but had run out.  Past Medical History  Diagnosis Date  . Preterm labor     c/s at 36 wks  . Placenta previa     hx and with current pregnancy  . Migraines     last one over 1 month ago  . Anxiety     no meds  . Depression     no meds  . Asthma   . ADHD (attention deficit hyperactivity disorder)   . Scoliosis    Past Surgical History  Procedure Laterality Date  . Cesarean section  2010    x 1 at 36 wks in [place]  . Svd  2003    x 1 in [place]  . Wisdom tooth extraction    . Tubal ligation     Family History  Problem Relation Age of Onset  . Cancer Mother   . Hypertension Sister   . Sickle cell trait Daughter   . Asthma Son   . Asthma Paternal Grandmother   . Pseudochol deficiency Neg Hx   . Malignant hyperthermia Neg Hx   . Anesthesia problems Neg Hx   . Hypotension Neg Hx    History  Substance Use Topics  . Smoking status: Current Every Day Smoker -- 0.20 packs/day for 12 years    Types: Cigarettes  . Smokeless tobacco: Never Used  . Alcohol Use: No      Comment: PReviously drank on occ   OB History   Grav Para Term Preterm Abortions TAB SAB Ect Mult Living   3 3 1 2  0 0 0 0 0 3     Review of Systems  Neurological: Positive for headaches.  All other systems reviewed and are negative.      Allergies  Review of patient's allergies indicates no known allergies.  Home Medications   Current Outpatient Rx  Name  Route  Sig  Dispense  Refill  . albuterol (PROVENTIL HFA;VENTOLIN HFA) 108 (90 BASE) MCG/ACT inhaler   Inhalation   Inhale 2 puffs into the lungs every 4 (four) hours as needed for wheezing or shortness of breath.   1 Inhaler   0   . ibuprofen (ADVIL,MOTRIN) 600 MG tablet   Oral   Take 600 mg by mouth every 6 (six) hours as needed (for pain.).         Marland Kitchen levonorgestrel (MIRENA) 20 MCG/24HR IUD   Intrauterine   1 each by Intrauterine route continuous.          . mometasone (NASONEX) 50 MCG/ACT nasal spray   Nasal   Place 2 sprays into the nose  daily.   17 g   12   . oxyCODONE-acetaminophen (PERCOCET) 10-325 MG per tablet   Oral   Take 1 tablet by mouth every 4 (four) hours as needed for pain.         . promethazine (PHENERGAN) 25 MG suppository   Rectal   Place 1 suppository (25 mg total) rectally every 6 (six) hours as needed for nausea or vomiting.   15 each   0   . promethazine (PHENERGAN) 25 MG tablet   Oral   Take 25 mg by mouth every 6 (six) hours as needed for nausea or vomiting.          BP 122/86  Pulse 92  Temp(Src) 97.7 F (36.5 C) (Oral)  Resp 14  SpO2 97%  LMP 01/06/2014 Physical Exam  Nursing note and vitals reviewed.  33 year old female, resting comfortably and in no acute distress. Vital signs are normal. Oxygen saturation is 97%, which is normal. Head is normocephalic and atraumatic. PERRLA, EOMI. Oropharynx is clear. Fundi show no hemorrhage, exudate, or papilledema. Neck is nontender and supple without adenopathy or JVD. Back is nontender and there is no CVA  tenderness. Lungs are clear without rales, wheezes, or rhonchi. Chest is nontender. Heart has regular rate and rhythm without murmur. Abdomen is soft, flat, nontender without masses or hepatosplenomegaly and peristalsis is normoactive. Extremities have no cyanosis or edema, full range of motion is present. Skin is warm and dry without rash. Neurologic: Mental status is normal, cranial nerves are intact, there are no motor or sensory deficits.  ED Course  Procedures (including critical care time)  MDM   Final diagnoses:  Migraine headache    Headache which seems to be a migraine variant. Bodyaches of uncertain cause. Old records are reviewed and she was seen 4 days ago with a headache and treated with metoclopramide with improvement. She'll be given the same treatment today but will also add a dose of a steroid.  She got good relief of headache with above noted treatment. She is discharged with a prescription for metoclopramide.  Delora Fuel, MD [id number] [phone]

## 2014-01-11 NOTE — ED Notes (Signed)
Bed: [facility]1 Expected date:  Expected time:  Means of arrival:  Comments: EMS 32yo F Headache

## 2014-01-12 ENCOUNTER — Emergency Department (HOSPITAL_COMMUNITY)
Admission: EM | Admit: 2014-01-12 | Discharge: 2014-01-12 | Disposition: A | Discharge status: Home / Self Care | Payer: Medicaid Other | Attending: Emergency Medicine | Admitting: Emergency Medicine

## 2014-01-12 ENCOUNTER — Encounter (HOSPITAL_COMMUNITY): Payer: Self-pay | Admitting: Emergency Medicine

## 2014-01-12 DIAGNOSIS — M545 Low back pain, unspecified: Secondary | ICD-10-CM

## 2014-01-12 DIAGNOSIS — G43909 Migraine, unspecified, not intractable, without status migrainosus: Secondary | ICD-10-CM | POA: Insufficient documentation

## 2014-01-12 DIAGNOSIS — Z79899 Other long term (current) drug therapy: Secondary | ICD-10-CM | POA: Insufficient documentation

## 2014-01-12 DIAGNOSIS — Z8659 Personal history of other mental and behavioral disorders: Secondary | ICD-10-CM | POA: Insufficient documentation

## 2014-01-12 DIAGNOSIS — Z8751 Personal history of pre-term labor: Secondary | ICD-10-CM | POA: Insufficient documentation

## 2014-01-12 DIAGNOSIS — M79609 Pain in unspecified limb: Secondary | ICD-10-CM | POA: Insufficient documentation

## 2014-01-12 DIAGNOSIS — F17200 Nicotine dependence, unspecified, uncomplicated: Secondary | ICD-10-CM | POA: Insufficient documentation

## 2014-01-12 DIAGNOSIS — IMO0001 Reserved for inherently not codable concepts without codable children: Secondary | ICD-10-CM | POA: Insufficient documentation

## 2014-01-12 DIAGNOSIS — J45909 Unspecified asthma, uncomplicated: Secondary | ICD-10-CM | POA: Insufficient documentation

## 2014-01-12 MED ORDER — CYCLOBENZAPRINE HCL 10 MG PO TABS: 10.0000 mg | ORAL_TABLET | Freq: Two times a day (BID) | ORAL | Status: DC | PRN

## 2014-01-12 MED ORDER — IBUPROFEN 800 MG PO TABS: 800.0000 mg | ORAL_TABLET | Freq: Three times a day (TID) | ORAL | Status: DC

## 2014-01-12 MED ORDER — CYCLOBENZAPRINE HCL 10 MG PO TABS
10.0000 mg | ORAL_TABLET | Freq: Once | ORAL | Status: AC
  Administered 2014-01-12: 10 mg via ORAL
  Filled 2014-01-12: qty 1

## 2014-01-12 MED ORDER — IBUPROFEN 800 MG PO TABS
800.0000 mg | ORAL_TABLET | Freq: Once | ORAL | Status: AC
  Administered 2014-01-12: 800 mg via ORAL
  Filled 2014-01-12: qty 1

## 2014-01-12 NOTE — Discharge Instructions (Signed)
Back Pain, Adult Low back pain is very common. About 1 in 5 people have back pain.The cause of low back pain is rarely dangerous. The pain often gets better over time.About half of people with a sudden onset of back pain feel better in just 2 weeks. About 8 in 10 people feel better by 6 weeks.  CAUSES Some common causes of back pain include:  Strain of the muscles or ligaments supporting the spine.  Wear and tear (degeneration) of the spinal discs.  Arthritis.  Direct injury to the back. DIAGNOSIS Most of the time, the direct cause of low back pain is not known.However, back pain can be treated effectively even when the exact cause of the pain is unknown.Answering your caregiver's questions about your overall health and symptoms is one of the most accurate ways to make sure the cause of your pain is not dangerous. If your caregiver needs more information, he or she may order lab work or imaging tests (X-rays or MRIs).However, even if imaging tests show changes in your back, this usually does not require surgery. HOME CARE INSTRUCTIONS For many people, back pain returns.Since low back pain is rarely dangerous, it is often a condition that people can learn to manageon their own.   Remain active. It is stressful on the back to sit or stand in one place. Do not sit, drive, or stand in one place for more than 30 minutes at a time. Take short walks on level surfaces as soon as pain allows.Try to increase the length of time you walk each day.  Do not stay in bed.Resting more than 1 or 2 days can delay your recovery.  Do not avoid exercise or work.Your body is made to move.It is not dangerous to be active, even though your back may hurt.Your back will likely heal faster if you return to being active before your pain is gone.  Pay attention to your body when you bend and lift. Many people have less discomfortwhen lifting if they bend their knees, keep the load close to their bodies,and  avoid twisting. Often, the most comfortable positions are those that put less stress on your recovering back.  Find a comfortable position to sleep. Use a firm mattress and lie on your side with your knees slightly bent. If you lie on your back, put a pillow under your knees.  Only take over-the-counter or prescription medicines as directed by your caregiver. Over-the-counter medicines to reduce pain and inflammation are often the most helpful.Your caregiver may prescribe muscle relaxant drugs.These medicines help dull your pain so you can more quickly return to your normal activities and healthy exercise.  Put ice on the injured area.  Put ice in a plastic bag.  Place a towel between your skin and the bag.  Leave the ice on for 15-20 minutes, 03-04 times a day for the first 2 to 3 days. After that, ice and heat may be alternated to reduce pain and spasms.  Ask your caregiver about trying back exercises and gentle massage. This may be of some benefit.  Avoid feeling anxious or stressed.Stress increases muscle tension and can worsen back pain.It is important to recognize when you are anxious or stressed and learn ways to manage it.Exercise is a great option. SEEK MEDICAL CARE IF:  You have pain that is not relieved with rest or medicine.  You have pain that does not improve in 1 week.  You have new symptoms.  You are generally not feeling well. SEEK   IMMEDIATE MEDICAL CARE IF:   You have pain that radiates from your back into your legs.  You develop new bowel or bladder control problems.  You have unusual weakness or numbness in your arms or legs.  You develop nausea or vomiting.  You develop abdominal pain.  You feel faint. Document Released: 10/31/2005 Document Revised: 05/01/2012 Document Reviewed: 03/21/2011 [employer] Patient Information 2014 [employer].  

## 2014-01-12 NOTE — ED Provider Notes (Signed)
CSN: [id number]     Arrival date & time 01/12/14  1759 History   First MD Initiated Contact with Patient 01/12/14 2000     Chief Complaint  Patient presents with  . Back Pain  . Leg Pain     (Consider location/radiation/quality/duration/timing/severity/associated sxs/prior Treatment) HPI Comments: Patient here today because of back pain that is radiating to legs that has reportedly occurred since January when she had the flu, but has gotten worse over the past 48 hours. She states the pain is constant, and not alleviated by any position, and is worse with movement. She has tried taking percocet 10 mg that she was prescribed previously for abdominal pain with no relief of back pain. A warm bath did provide some relief yesterday. Denies any loss of bladder or bowel control, fever, and chest pain.   Patient is a 33 y.o. female presenting with back pain and leg pain. The history is provided by the patient.  Back Pain Associated symptoms: leg pain   Associated symptoms: no abdominal pain, no chest pain and no fever   Leg Pain Associated symptoms: back pain   Associated symptoms: no fever     Past Medical History  Diagnosis Date  . Preterm labor     c/s at 36 wks  . Placenta previa     hx and with current pregnancy  . Migraines     last one over 1 month ago  . Anxiety     no meds  . Depression     no meds  . Asthma   . ADHD (attention deficit hyperactivity disorder)   . Scoliosis    Past Surgical History  Procedure Laterality Date  . Cesarean section  2010    x 1 at 36 wks in [place]  . Svd  2003    x 1 in [place]  . Wisdom tooth extraction    . Tubal ligation     Family History  Problem Relation Age of Onset  . Cancer Mother   . Hypertension Sister   . Sickle cell trait Daughter   . Asthma Son   . Asthma Paternal Grandmother   . Pseudochol deficiency Neg Hx   . Malignant hyperthermia Neg Hx   . Anesthesia problems Neg Hx   . Hypotension Neg Hx    History  Substance  Use Topics  . Smoking status: Current Every Day Smoker -- 0.20 packs/day for 12 years    Types: Cigarettes  . Smokeless tobacco: Never Used  . Alcohol Use: No     Comment: PReviously drank on occ   OB History   Grav Para Term Preterm Abortions TAB SAB Ect Mult Living   3 3 1 2  0 0 0 0 0 3     Review of Systems  Constitutional: Negative for fever.  Cardiovascular: Negative for chest pain.  Gastrointestinal: Negative for abdominal pain.  Genitourinary: Negative.   Musculoskeletal: Positive for back pain and myalgias.      Allergies  Review of patient's allergies indicates no known allergies.  Home Medications   Current Outpatient Rx  Name  Route  Sig  Dispense  Refill  . ibuprofen (ADVIL,MOTRIN) 600 MG tablet   Oral   Take 600 mg by mouth every 6 (six) hours as needed for moderate pain.          Marland Kitchen oxyCODONE-acetaminophen (PERCOCET) 10-325 MG per tablet   Oral   Take 1 tablet by mouth every 4 (four) hours as needed for pain.         Marland Kitchen  promethazine (PHENERGAN) 25 MG suppository   Rectal   Place 1 suppository (25 mg total) rectally every 6 (six) hours as needed for nausea or vomiting.   15 each   0   . promethazine (PHENERGAN) 25 MG tablet   Oral   Take 25 mg by mouth every 6 (six) hours as needed for nausea or vomiting.         Marland Kitchen albuterol (PROVENTIL HFA;VENTOLIN HFA) 108 (90 BASE) MCG/ACT inhaler   Inhalation   Inhale 2 puffs into the lungs every 4 (four) hours as needed for wheezing or shortness of breath.   1 Inhaler   0   . levonorgestrel (MIRENA) 20 MCG/24HR IUD   Intrauterine   1 each by Intrauterine route continuous.          . metoCLOPramide (REGLAN) 10 MG tablet   Oral   Take 1 tablet (10 mg total) by mouth every 6 (six) hours as needed for nausea (or headache).   30 tablet   0    BP 130/89  Pulse 94  Temp(Src) 98.2 F (36.8 C) (Oral)  Resp 20  SpO2 98%  LMP 01/06/2014 Physical Exam  Constitutional: She is oriented to person, place,  and time. She appears well-developed and well-nourished. No distress.  Pulmonary/Chest: She exhibits no tenderness.  Abdominal: Soft. She exhibits no distension. There is no tenderness.  Musculoskeletal: She exhibits tenderness.       Lumbar back: She exhibits no tenderness, no bony tenderness, no swelling, no edema and no deformity.  Tenderness noted in calves bilaterally with palpation, but is without swelling or discoloration. Full ROM present in all lower extremities bilaterally. No strength deficits.  Neurological: She is alert and oriented to person, place, and time.  Sensation intact for lower extremities bilaterally. Straight leg raise negative bilaterally.  Skin: Skin is warm and dry. She is not diaphoretic. No erythema.    ED Course  Procedures (including critical care time) Labs Review Labs Reviewed - No data to display Imaging Review No results found.   EKG Interpretation None      MDM   Final diagnoses:  Recurrent low back pain    Discussed that the best potential for definitive answers and resolution would be follow up with orthopedics. Referral made. Ibuprofen and Flexeril prescribed.     Dewaine Oats, PA-C 01/14/14 1038

## 2014-01-12 NOTE — ED Notes (Signed)
Pt reports lower back pain and leg pain so severe that she is having difficulty walking. Pt states that she has been seen at this facility for the same.

## 2014-01-14 NOTE — ED Provider Notes (Signed)
Medical screening examination/treatment/procedure(s) were performed by non-physician practitioner and as supervising physician I was immediately available for consultation/collaboration.   EKG Interpretation None        Maudry Diego, MD 01/14/14 1451

## 2014-03-03 ENCOUNTER — Emergency Department (HOSPITAL_COMMUNITY): Payer: Medicaid Other

## 2014-03-03 ENCOUNTER — Emergency Department (HOSPITAL_COMMUNITY)
Admission: EM | Admit: 2014-03-03 | Discharge: 2014-03-04 | Disposition: A | Discharge status: Home / Self Care | Payer: Medicaid Other | Attending: Emergency Medicine | Admitting: Emergency Medicine

## 2014-03-03 ENCOUNTER — Encounter (HOSPITAL_COMMUNITY): Payer: Self-pay | Admitting: Emergency Medicine

## 2014-03-03 DIAGNOSIS — Z8679 Personal history of other diseases of the circulatory system: Secondary | ICD-10-CM | POA: Insufficient documentation

## 2014-03-03 DIAGNOSIS — J45909 Unspecified asthma, uncomplicated: Secondary | ICD-10-CM | POA: Insufficient documentation

## 2014-03-03 DIAGNOSIS — Z8659 Personal history of other mental and behavioral disorders: Secondary | ICD-10-CM | POA: Insufficient documentation

## 2014-03-03 DIAGNOSIS — F17200 Nicotine dependence, unspecified, uncomplicated: Secondary | ICD-10-CM | POA: Insufficient documentation

## 2014-03-03 DIAGNOSIS — M25569 Pain in unspecified knee: Secondary | ICD-10-CM | POA: Insufficient documentation

## 2014-03-03 DIAGNOSIS — M25562 Pain in left knee: Secondary | ICD-10-CM

## 2014-03-03 DIAGNOSIS — M545 Low back pain, unspecified: Secondary | ICD-10-CM | POA: Insufficient documentation

## 2014-03-03 DIAGNOSIS — R5381 Other malaise: Secondary | ICD-10-CM | POA: Insufficient documentation

## 2014-03-03 DIAGNOSIS — Z3202 Encounter for pregnancy test, result negative: Secondary | ICD-10-CM | POA: Insufficient documentation

## 2014-03-03 DIAGNOSIS — R319 Hematuria, unspecified: Secondary | ICD-10-CM

## 2014-03-03 DIAGNOSIS — R110 Nausea: Secondary | ICD-10-CM | POA: Insufficient documentation

## 2014-03-03 DIAGNOSIS — M549 Dorsalgia, unspecified: Secondary | ICD-10-CM

## 2014-03-03 DIAGNOSIS — R51 Headache: Secondary | ICD-10-CM | POA: Insufficient documentation

## 2014-03-03 DIAGNOSIS — R5383 Other fatigue: Secondary | ICD-10-CM

## 2014-03-03 DIAGNOSIS — R638 Other symptoms and signs concerning food and fluid intake: Secondary | ICD-10-CM | POA: Insufficient documentation

## 2014-03-03 DIAGNOSIS — R109 Unspecified abdominal pain: Secondary | ICD-10-CM

## 2014-03-03 LAB — COMPREHENSIVE METABOLIC PANEL
ALT: 13 U/L (ref 0–35)
AST: 14 U/L (ref 0–37)
Albumin: 4.1 g/dL (ref 3.5–5.2)
Alkaline Phosphatase: 81 U/L (ref 39–117)
BUN: 9 mg/dL (ref 6–23)
CO2: 25 mEq/L (ref 19–32)
Calcium: 9.5 mg/dL (ref 8.4–10.5)
Chloride: 104 mEq/L (ref 96–112)
Creatinine, Ser: 0.67 mg/dL (ref 0.50–1.10)
GFR calc Af Amer: >90 mL/min (ref >90)
GFR calc non Af Amer: >90 mL/min (ref >90)
Glucose, Bld: 72 mg/dL (ref 70–99)
Potassium: 4.1 mEq/L (ref 3.7–5.3)
Sodium: 139 mEq/L (ref 137–147)
Total Bilirubin: 0.4 mg/dL (ref 0.3–1.2)
Total Protein: 7.8 g/dL (ref 6.0–8.3)

## 2014-03-03 LAB — URINALYSIS, ROUTINE W REFLEX MICROSCOPIC
Bilirubin Urine: NEGATIVE
Glucose, UA: NEGATIVE mg/dL
Ketones, ur: NEGATIVE mg/dL
Leukocytes, UA: NEGATIVE
Nitrite: NEGATIVE
Protein, ur: NEGATIVE mg/dL
Specific Gravity, Urine: 1.025 (ref 1.005–1.030)
Urobilinogen, UA: 0.2 mg/dL (ref 0.0–1.0)
pH: 5 (ref 5.0–8.0)

## 2014-03-03 LAB — CBC WITH DIFFERENTIAL/PLATELET
Basophils Absolute: 0.1 10*3/uL (ref 0.0–0.1)
Basophils Relative: 1 % (ref 0–1)
Eosinophils Absolute: 0.3 10*3/uL (ref 0.0–0.7)
Eosinophils Relative: 3 % (ref 0–5)
HCT: 42.7 % (ref 36.0–46.0)
Hemoglobin: 14.8 g/dL (ref 12.0–15.0)
Lymphocytes Relative: 27 % (ref 12–46)
Lymphs Abs: 2.8 10*3/uL (ref 0.7–4.0)
MCH: 29 pg (ref 26.0–34.0)
MCHC: 34.7 g/dL (ref 30.0–36.0)
MCV: 83.6 fL (ref 78.0–100.0)
Monocytes Absolute: 0.4 10*3/uL (ref 0.1–1.0)
Monocytes Relative: 4 % (ref 3–12)
Neutro Abs: 6.6 10*3/uL (ref 1.7–7.7)
Neutrophils Relative %: 65 % (ref 43–77)
Platelets: 358 10*3/uL (ref 150–400)
RBC: 5.11 MIL/uL (ref 3.87–5.11)
RDW: 14.3 % (ref 11.5–15.5)
WBC: 10.1 10*3/uL (ref 4.0–10.5)

## 2014-03-03 LAB — URINE MICROSCOPIC-ADD ON

## 2014-03-03 LAB — WET PREP, GENITAL
Clue Cells Wet Prep HPF POC: NONE SEEN
Trich, Wet Prep: NONE SEEN
Yeast Wet Prep HPF POC: NONE SEEN

## 2014-03-03 LAB — POC URINE PREG, ED: Preg Test, Ur: NEGATIVE

## 2014-03-03 MED ORDER — ONDANSETRON HCL 4 MG/2ML IJ SOLN
4.0000 mg | Freq: Once | INTRAMUSCULAR | Status: AC
  Administered 2014-03-03: 4 mg via INTRAVENOUS
  Filled 2014-03-03: qty 2

## 2014-03-03 MED ORDER — MORPHINE SULFATE 4 MG/ML IJ SOLN
2.0000 mg | Freq: Once | INTRAMUSCULAR | Status: AC
  Administered 2014-03-03: 2 mg via INTRAVENOUS
  Filled 2014-03-03: qty 1

## 2014-03-03 NOTE — ED Notes (Signed)
One unsuccessful attempt at IV insertion, another RN to attempt.

## 2014-03-03 NOTE — ED Notes (Signed)
Pt states abdominal pain to lower back.  Then patient describes bilateral pain radiating down both legs.  Pt asked about previous visits and if pain in body resembles same and pt stated yes,.

## 2014-03-03 NOTE — ED Notes (Signed)
Pt ambulated to restroom with steady gait. Warm blankets provided.

## 2014-03-03 NOTE — ED Notes (Signed)
PT transported to XRAY 

## 2014-03-03 NOTE — ED Provider Notes (Signed)
CSN: [id number]     Arrival date & time 03/03/14  1720 History   First MD Initiated Contact with Patient 03/03/14 1926     Chief Complaint  Patient presents with  . Back Pain  . Abdominal Pain   HPI  Kayleen Jacklin is a 33 y.o. female with a PMH of migraines, anxiety, depression, asthma, ADHD and scoliosis who presents to the ED for evaluation of back and abdominal pain. History was provided by the patient.   Patient states that she has had lower back pain since a flu like illness in 11/2013. She states that she had this for several months from January to March and had a few weeks of relief until two days ago when her pain returned and is unchanged. Her back pain is located in her lower back with radiation down both legs but currently on the left leg (alternates between left and right). She also has bilateral anterior thigh pain which also alternates from the left to right, but is currently present on the left leg. Pain worse with standing for long periods of time. No leg pain at rest. No leg swelling. No hx of DVT or PE. No recent travels or surgeries. Patient seen here in February for continued back and leg pain (unchanged) and was referred to Dr. Rhona Raider who she saw for follow-up. Instructed to get PT and follow-up with rheumatology. Given steroid taper and muscle relaxer which did not help. Has also tired Ibuprofen which somewhat helps. Has appointment to see a new PCP on 03/10/14 and will get referral to rheumatology at that point. She denies any loss of bowel/bladder function, weakness, loss of sensation, numbness/tingling. No hx of cancer or IV drug use. Two days ago, patient states she was laying down and rolled over hitting her left knee on a wooden table. She has had diffuse left knee pain ever since. Pain worse with ROM of the left knee and ambulation.      She also states she has had abdominal pain for the past 3-4 days. Pain is located in the middle lower pelvis without radiation. Pain is  described as an intermittent sharp pain. Patient also had nausea with no emesis. No dysuria, hematuria, vaginal bleeding/discharge, diarrhea, or constipation. Patient sexually active with no new partners. No concern for STD's. Has had nausea and decreased appetite with no emesis. Has not been feeling well in general. Has also had more frequent migraines which are similar to her chronic migraines. She denies any fevers, chills, change in appetite/activity, rhinorrhea, congestion, SOB, and chest pain.    Past Medical History  Diagnosis Date  . Preterm labor     c/s at 36 wks  . Placenta previa     hx and with current pregnancy  . Migraines     last one over 1 month ago  . Anxiety     no meds  . Depression     no meds  . Asthma   . ADHD (attention deficit hyperactivity disorder)   . Scoliosis    Past Surgical History  Procedure Laterality Date  . Cesarean section  2010    x 1 at 36 wks in [place]  . Svd  2003    x 1 in [place]  . Wisdom tooth extraction    . Tubal ligation     Family History  Problem Relation Age of Onset  . Cancer Mother   . Hypertension Sister   . Sickle cell trait Daughter   . Asthma Son   .  Asthma Paternal Grandmother   . Pseudochol deficiency Neg Hx   . Malignant hyperthermia Neg Hx   . Anesthesia problems Neg Hx   . Hypotension Neg Hx    History  Substance Use Topics  . Smoking status: Current Every Day Smoker -- 0.20 packs/day for 12 years    Types: Cigarettes  . Smokeless tobacco: Never Used  . Alcohol Use: No     Comment: PReviously drank on occ   OB History   Grav Para Term Preterm Abortions TAB SAB Ect Mult Living   3 3 1 2  0 0 0 0 0 3     Review of Systems  Constitutional: Positive for appetite change and fatigue. Negative for fever, chills, diaphoresis, activity change and unexpected weight change.  HENT: Negative for congestion, rhinorrhea and sore throat.   Respiratory: Negative for cough and shortness of breath.   Cardiovascular:  Negative for chest pain and leg swelling.  Gastrointestinal: Positive for nausea and abdominal pain. Negative for vomiting, diarrhea and constipation.  Genitourinary: Positive for pelvic pain. Negative for dysuria, frequency, hematuria, flank pain, decreased urine volume, vaginal bleeding, vaginal discharge, difficulty urinating, genital sores and vaginal pain.  Musculoskeletal: Positive for back pain. Negative for arthralgias, gait problem and myalgias.  Skin: Negative for color change and wound.  Neurological: Positive for headaches. Negative for dizziness, weakness, light-headedness and numbness.    Allergies  Review of patient's allergies indicates no known allergies.  Home Medications   Prior to Admission medications   Medication Sig Start Date End Date Taking? Authorizing Provider  levonorgestrel (MIRENA) 20 MCG/24HR IUD 1 each by Intrauterine route continuous.    Yes Historical Provider, MD   BP 125/81  Pulse 91  Temp(Src) 98.2 F (36.8 C) (Oral)  Resp 18  Ht 5\' 4"  (1.626 m)  Wt 180 lb (81.647 kg)  BMI 30.88 kg/m2  SpO2 98%  LMP 12/16/2013  Filed Vitals:   03/03/14 1755 03/03/14 1928 03/03/14 2312  BP: 115/74 125/81 122/82  Pulse: 80 91 61  Temp: 98.2 F (36.8 C)    TempSrc: Oral    Resp: 18  16  Height: 5\' 4"  (1.626 m)    Weight: 180 lb (81.647 kg)    SpO2: 100% 98%     Physical Exam  Nursing note and vitals reviewed. Constitutional: She is oriented to person, place, and time. She appears well-developed and well-nourished. No distress.  Non-toxic  HENT:  Head: Normocephalic and atraumatic.  Right Ear: External ear normal.  Left Ear: External ear normal.  Nose: Nose normal.  Mouth/Throat: Oropharynx is clear and moist.  Eyes: Conjunctivae are normal. Right eye exhibits no discharge. Left eye exhibits no discharge.  Neck: Normal range of motion. Neck supple.  No cervical spinal or paraspinal tenderness to palpation throughout.  No limitations with neck ROM.     Cardiovascular: Normal rate, regular rhythm, normal heart sounds and intact distal pulses.  Exam reveals no gallop and no friction rub.   No murmur heard. Dorsalis pedis pulses present and equal bilaterally  Pulmonary/Chest: Effort normal and breath sounds normal. No respiratory distress. She has no wheezes. She has no rales. She exhibits no tenderness.  Abdominal: Soft. Bowel sounds are normal. She exhibits no distension and no mass. There is tenderness. There is no rebound and no guarding.  Middle lower pelvic tenderness. No peritoneal signs.   Musculoskeletal: Normal range of motion. She exhibits tenderness. She exhibits no edema.       Arms: Tenderness to palpation  to the lower lumbar spinal and paraspinal muscles throughout. Patient has increased pain in the back of her legs with straight leg raise but does not radiate from her back. No tenderness to palpation to the legs throughout. Negative Homan's bilaterally. No LE edema or calf tenderness bilaterally. Strength 5/5 in the upper and lower extremities bilaterally. Patient has diffuse tenderness to palpation to the left anterior knee with no edema, erythema, ecchymosis, or wounds. Left knee pain increased with ROM. Antalgic gait without ataxia.   Neurological: She is alert and oriented to person, place, and time.  Patellar reflexes intact bilaterally. Sensation intact in the UE and LE bilaterally.   Skin: Skin is warm and dry. She is not diaphoretic.        ED Course  Procedures (including critical care time) Labs Review Labs Reviewed  CBC WITH DIFFERENTIAL  COMPREHENSIVE METABOLIC PANEL  URINALYSIS, ROUTINE W REFLEX MICROSCOPIC  POC URINE PREG, ED    Imaging Review US Transvaginal Non-ob  03/03/2014   CLINICAL DATA:  Left pelvic pain  EXAM: TRANSABDOMINAL AND TRANSVAGINAL ULTRASOUND OF PELVIS  DOPPLER ULTRASOUND OF OVARIES  TECHNIQUE: Both transabdominal and transvaginal ultrasound examinations of the pelvis were performed.  Transabdominal technique was performed for global imaging of the pelvis including uterus, ovaries, adnexal regions, and pelvic cul-de-sac.  It was necessary to proceed with endovaginal exam following the transabdominal exam to visualize the adnexa. Color and duplex Doppler ultrasound was utilized to evaluate blood flow to the ovaries.  COMPARISON:  Multiple prior studies  FINDINGS: Uterus  Measurements: 5.9 x 4.8 x 6.0 cm. Retroflexed.  Endometrium  Thickness: An IUD is in place within the fundal endometrium. No abnormal thickening in the endometrium. No focal abnormality visualized.  Right ovary  Measurements: 3.8 x 3.4 x 2.9 cm. Normal appearance/no adnexal mass.  Left ovary  Measurements: 3.0 x 2.6 x 1.6 cm. Normal appearance/no adnexal mass.  Pulsed Doppler evaluation of both ovaries demonstrates normal low-resistance arterial and venous waveforms.  Other findings  No free fluid.  IMPRESSION: No evidence of ovarian torsion.  IUD is in place within the fundal endometrium.   Electronically Signed   By: Maryclare Bean M.D.   On: 03/03/2014 23:17   US Pelvis Complete  03/03/2014   CLINICAL DATA:  Left pelvic pain  EXAM: TRANSABDOMINAL AND TRANSVAGINAL ULTRASOUND OF PELVIS  DOPPLER ULTRASOUND OF OVARIES  TECHNIQUE: Both transabdominal and transvaginal ultrasound examinations of the pelvis were performed. Transabdominal technique was performed for global imaging of the pelvis including uterus, ovaries, adnexal regions, and pelvic cul-de-sac.  It was necessary to proceed with endovaginal exam following the transabdominal exam to visualize the adnexa. Color and duplex Doppler ultrasound was utilized to evaluate blood flow to the ovaries.  COMPARISON:  Multiple prior studies  FINDINGS: Uterus  Measurements: 5.9 x 4.8 x 6.0 cm. Retroflexed.  Endometrium  Thickness: An IUD is in place within the fundal endometrium. No abnormal thickening in the endometrium. No focal abnormality visualized.  Right ovary  Measurements: 3.8 x  3.4 x 2.9 cm. Normal appearance/no adnexal mass.  Left ovary  Measurements: 3.0 x 2.6 x 1.6 cm. Normal appearance/no adnexal mass.  Pulsed Doppler evaluation of both ovaries demonstrates normal low-resistance arterial and venous waveforms.  Other findings  No free fluid.  IMPRESSION: No evidence of ovarian torsion.  IUD is in place within the fundal endometrium.   Electronically Signed   By: Maryclare Bean M.D.   On: 03/03/2014 23:17   [place] Art/ven Flow Abd  Pelv Doppler  03/03/2014   CLINICAL DATA:  Left pelvic pain  EXAM: TRANSABDOMINAL AND TRANSVAGINAL ULTRASOUND OF PELVIS  DOPPLER ULTRASOUND OF OVARIES  TECHNIQUE: Both transabdominal and transvaginal ultrasound examinations of the pelvis were performed. Transabdominal technique was performed for global imaging of the pelvis including uterus, ovaries, adnexal regions, and pelvic cul-de-sac.  It was necessary to proceed with endovaginal exam following the transabdominal exam to visualize the adnexa. Color and duplex Doppler ultrasound was utilized to evaluate blood flow to the ovaries.  COMPARISON:  Multiple prior studies  FINDINGS: Uterus  Measurements: 5.9 x 4.8 x 6.0 cm. Retroflexed.  Endometrium  Thickness: An IUD is in place within the fundal endometrium. No abnormal thickening in the endometrium. No focal abnormality visualized.  Right ovary  Measurements: 3.8 x 3.4 x 2.9 cm. Normal appearance/no adnexal mass.  Left ovary  Measurements: 3.0 x 2.6 x 1.6 cm. Normal appearance/no adnexal mass.  Pulsed Doppler evaluation of both ovaries demonstrates normal low-resistance arterial and venous waveforms.  Other findings  No free fluid.  IMPRESSION: No evidence of ovarian torsion.  IUD is in place within the fundal endometrium.   Electronically Signed   By: Maryclare Bean M.D.   On: 03/03/2014 23:17   Dg Knee Complete 4 Views Left  03/03/2014   CLINICAL DATA:  Left knee pain after injury today.  EXAM: LEFT KNEE - COMPLETE 4+ VIEW  COMPARISON:  None.  FINDINGS: There is no  evidence of fracture, dislocation, or joint effusion. There is no evidence of arthropathy or other focal bone abnormality. Soft tissues are unremarkable.  IMPRESSION: Negative.   Electronically Signed   By: Lucienne Capers M.D.   On: 03/03/2014 22:22     EKG Interpretation None      Results for orders placed during the hospital encounter of 03/03/14  URINALYSIS, ROUTINE W REFLEX MICROSCOPIC      Result Value Ref Range   Color, Urine YELLOW  YELLOW   APPearance CLEAR  CLEAR   Specific Gravity, Urine 1.025  1.005 - 1.030   pH 5.0  5.0 - 8.0   Glucose, UA NEGATIVE  NEGATIVE mg/dL   Hgb urine dipstick SMALL (*) NEGATIVE   Bilirubin Urine NEGATIVE  NEGATIVE   Ketones, ur NEGATIVE  NEGATIVE mg/dL   Protein, ur NEGATIVE  NEGATIVE mg/dL   Urobilinogen, UA 0.2  0.0 - 1.0 mg/dL   Nitrite NEGATIVE  NEGATIVE   Leukocytes, UA NEGATIVE  NEGATIVE  CBC WITH DIFFERENTIAL      Result Value Ref Range   WBC 10.1  4.0 - 10.5 K/uL   RBC 5.11  3.87 - 5.11 MIL/uL   Hemoglobin 14.8  12.0 - 15.0 g/dL   HCT 42.7  36.0 - 46.0 %   MCV 83.6  78.0 - 100.0 fL   MCH 29.0  26.0 - 34.0 pg   MCHC 34.7  30.0 - 36.0 g/dL   RDW 14.3  11.5 - 15.5 %   Platelets 358  150 - 400 K/uL   Neutrophils Relative % 65  43 - 77 %   Neutro Abs 6.6  1.7 - 7.7 K/uL   Lymphocytes Relative 27  12 - 46 %   Lymphs Abs 2.8  0.7 - 4.0 K/uL   Monocytes Relative 4  3 - 12 %   Monocytes Absolute 0.4  0.1 - 1.0 K/uL   Eosinophils Relative 3  0 - 5 %   Eosinophils Absolute 0.3  0.0 - 0.7 K/uL   Basophils Relative  1  0 - 1 %   Basophils Absolute 0.1  0.0 - 0.1 K/uL  COMPREHENSIVE METABOLIC PANEL      Result Value Ref Range   Sodium 139  137 - 147 mEq/L   Potassium 4.1  3.7 - 5.3 mEq/L   Chloride 104  96 - 112 mEq/L   CO2 25  19 - 32 mEq/L   Glucose, Bld 72  70 - 99 mg/dL   BUN 9  6 - 23 mg/dL   Creatinine, Ser 0.67  0.50 - 1.10 mg/dL   Calcium 9.5  8.4 - 10.5 mg/dL   Total Protein 7.8  6.0 - 8.3 g/dL   Albumin 4.1  3.5 - 5.2  g/dL   AST 14  0 - 37 U/L   ALT 13  0 - 35 U/L   Alkaline Phosphatase 81  39 - 117 U/L   Total Bilirubin 0.4  0.3 - 1.2 mg/dL   GFR calc non Af Amer >90  >90 mL/min   GFR calc Af Amer >90  >90 mL/min  URINE MICROSCOPIC-ADD ON      Result Value Ref Range   Squamous Epithelial / LPF RARE  RARE   WBC, UA 0-2  <3 WBC/hpf   RBC / HPF 0-2  <3 RBC/hpf   Bacteria, UA RARE  RARE  POC URINE PREG, ED      Result Value Ref Range   Preg Test, Ur NEGATIVE  NEGATIVE     MDM   Lorre Rosana Hoes is a 33 y.o. female  with a PMH of migraines, anxiety, depression, asthma, ADHD and scoliosis who presents to the ED for evaluation of back and abdominal pain   Rechecks  10:00 PM = Pelvic exam at bedside. Strings visualized. Minimal clear vaginal discharge. Left adnexal tenderness. No CMT or right adnexal tenderness. Knee pain 7/10. No abdominal pain, however patient has pain in the LLQ with palpation. Back pain 5/10.  11:00 PM = Knee pain 3/10. No abdominal pain. Repeat abdominal exam benign. No tenderness. No abdominal pain.  11:30 PM = Pain moving throughout the abdomen. Now having umbilical and epigastric pain. Patient states "I think it is gas." Repeat abdominal exam benign.   11:45 PM = Patient states she thinks she is ready for discharge. States she feels much better. Has follow-up appointment with Dr. Marland KitchenD" on 03/10/14.    Patient has multiple complaints. Abdominal pain unclear and [place] changing in location throughout ED visit. Abdominal exam benign. Pelvic exam benign however patient had left sided adnexal tenderness. Follow-up pelvic US negative for torsion or other acute abnormality. Doubt PID. Patient afebrile and non-toxic in appearance. Labs unremarkable. No UTI. Small hematuria possibly incidental. Patient instructed to follow-up regarding this. Patient also complained of chronic unchanged back pain. Seen in the ED for this previously and followed-up with orthopedics. Referred to rheumatology and back  to PCP. Patient has follow-up appointments for this. No warning signs or symptoms of back pain including loss of bowel or bladder control, night sweats, waking from sleep with back pain, unexplained fevers or weight loss, history of cancer, or IV drug use. No concern for cauda equina, epidural abscess, or other serious/life threatening cause of back pain. Patient neurovascularly intact. Patient also had knee pain after hitting her knee two days ago. X-rays negative for fracture or malalignment. Given knee brace. Patient also had leg pain bilaterally which has been alternating. No evidence of leg edema/erythema to suggest DVT or cellulitis. Patient had improvements in her condition  throughout her ED visit. Vital signs stable. Return precautions, discharge instructions, and follow-up was discussed with the patient before discharge.      Discharge Medication List as of 03/04/2014 12:12 AM    START taking these medications   Details  HYDROcodone-acetaminophen (NORCO/VICODIN) 5-325 MG per tablet Take 1 tablet by mouth every 4 (four) hours as needed., Starting 03/04/2014, Until Discontinued, Print    ibuprofen (ADVIL,MOTRIN) 800 MG tablet Take 1 tablet (800 mg total) by mouth 3 (three) times daily., Starting 03/04/2014, Until Discontinued, Print        Final impressions: 1. Abdominal pain   2. Back pain   3. Left knee pain   4. Hematuria      Harold Hedge [place], PA-C 03/04/14 1340

## 2014-03-03 NOTE — ED Notes (Signed)
Pt ambulated to restroom with steady gait.

## 2014-03-03 NOTE — ED Notes (Signed)
US at bedside

## 2014-03-04 ENCOUNTER — Telehealth (HOSPITAL_COMMUNITY): Payer: Self-pay | Admitting: Emergency Medicine

## 2014-03-04 LAB — GC/CHLAMYDIA PROBE AMP
CT Probe RNA: NEGATIVE
GC Probe RNA: NEGATIVE

## 2014-03-04 MED ORDER — IBUPROFEN 800 MG PO TABS: 800.0000 mg | ORAL_TABLET | Freq: Three times a day (TID) | ORAL | Status: DC

## 2014-03-04 MED ORDER — HYDROCODONE-ACETAMINOPHEN 5-325 MG PO TABS: 1.0000 | ORAL_TABLET | ORAL | Status: DC | PRN

## 2014-03-04 NOTE — Discharge Instructions (Signed)
Your urine had mild blood on it - please follow-up with your doctor regarding this  Use knee brace for knee pain - rest, ice, and follow-up with orthopedics  Return to the emergency department if you develop any changing/worsening condition, fever, worsening pain, repeated vomiting, leg swelling, loss of bowel/bladder function, weakness, loss of sensation, or any other concerns (please read additional information regarding your condition below)   Abdominal Pain, Women Abdominal (stomach, pelvic, or belly) pain can be caused by many things. It is important to tell your doctor:  The location of the pain.  Does it come and go or is it present all the time?  Are there things that start the pain (eating certain foods, exercise)?  Are there other symptoms associated with the pain (fever, nausea, vomiting, diarrhea)? All of this is helpful to know when trying to find the cause of the pain. CAUSES   Stomach: virus or bacteria infection, or ulcer.  Intestine: appendicitis (inflamed appendix), regional ileitis (Crohn's disease), ulcerative colitis (inflamed colon), irritable bowel syndrome, diverticulitis (inflamed diverticulum of the colon), or cancer of the stomach or intestine.  Gallbladder disease or stones in the gallbladder.  Kidney disease, kidney stones, or infection.  Pancreas infection or cancer.  Fibromyalgia (pain disorder).  Diseases of the female organs:  Uterus: fibroid (non-cancerous) tumors or infection.  Fallopian tubes: infection or tubal pregnancy.  Ovary: cysts or tumors.  Pelvic adhesions (scar tissue).  Endometriosis (uterus lining tissue growing in the pelvis and on the pelvic organs).  Pelvic congestion syndrome (female organs filling up with blood just before the menstrual period).  Pain with the menstrual period.  Pain with ovulation (producing an egg).  Pain with an IUD (intrauterine device, birth control) in the uterus.  Cancer of the female  organs.  Functional pain (pain not caused by a disease, may improve without treatment).  Psychological pain.  Depression. DIAGNOSIS  Your doctor will decide the seriousness of your pain by doing an examination.  Blood tests.  X-rays.  Ultrasound.  CT scan (computed tomography, special type of X-ray).  MRI (magnetic resonance imaging).  Cultures, for infection.  Barium enema (dye inserted in the large intestine, to better view it with X-rays).  Colonoscopy (looking in intestine with a lighted tube).  Laparoscopy (minor surgery, looking in abdomen with a lighted tube).  Major abdominal exploratory surgery (looking in abdomen with a large incision). TREATMENT  The treatment will depend on the cause of the pain.   Many cases can be observed and treated at home.  Over-the-counter medicines recommended by your caregiver.  Prescription medicine.  Antibiotics, for infection.  Birth control pills, for painful periods or for ovulation pain.  Hormone treatment, for endometriosis.  Nerve blocking injections.  Physical therapy.  Antidepressants.  Counseling with a psychologist or psychiatrist.  Minor or major surgery. HOME CARE INSTRUCTIONS   Do not take laxatives, unless directed by your caregiver.  Take over-the-counter pain medicine only if ordered by your caregiver. Do not take aspirin because it can cause an upset stomach or bleeding.  Try a clear liquid diet (broth or water) as ordered by your caregiver. Slowly move to a bland diet, as tolerated, if the pain is related to the stomach or intestine.  Have a thermometer and take your temperature several times a day, and record it.  Bed rest and sleep, if it helps the pain.  Avoid sexual intercourse, if it causes pain.  Avoid stressful situations.  Keep your follow-up appointments and tests, as  your caregiver orders.  If the pain does not go away with medicine or surgery, you may  try:  Acupuncture.  Relaxation exercises (yoga, meditation).  Group therapy.  Counseling. SEEK MEDICAL CARE IF:   You notice certain foods cause stomach pain.  Your home care treatment is not helping your pain.  You need stronger pain medicine.  You want your IUD removed.  You feel faint or lightheaded.  You develop nausea and vomiting.  You develop a rash.  You are having side effects or an allergy to your medicine. SEEK IMMEDIATE MEDICAL CARE IF:   Your pain does not go away or gets worse.  You have a fever.  Your pain is felt only in portions of the abdomen. The right side could possibly be appendicitis. The left lower portion of the abdomen could be colitis or diverticulitis.  You are passing blood in your stools (bright red or black tarry stools, with or without vomiting).  You have blood in your urine.  You develop chills, with or without a fever.  You pass out. MAKE SURE YOU:   Understand these instructions.  Will watch your condition.  Will get help right away if you are not doing well or get worse. Document Released: 08/28/2007 Document Revised: 01/23/2012 Document Reviewed: 09/17/2009 [facility] Patient Information 2014 [place], [place].  Back Pain, Adult Low back pain is very common. About 1 in 5 people have back pain.The cause of low back pain is rarely dangerous. The pain often gets better over time.About half of people with a sudden onset of back pain feel better in just 2 weeks. About 8 in 10 people feel better by 6 weeks.  CAUSES Some common causes of back pain include:  Strain of the muscles or ligaments supporting the spine.  Wear and tear (degeneration) of the spinal discs.  Arthritis.  Direct injury to the back. DIAGNOSIS Most of the time, the direct cause of low back pain is not known.However, back pain can be treated effectively even when the exact cause of the pain is unknown.Answering your caregiver's questions about your  overall health and symptoms is one of the most accurate ways to make sure the cause of your pain is not dangerous. If your caregiver needs more information, he or she may order lab work or imaging tests (X-rays or MRIs).However, even if imaging tests show changes in your back, this usually does not require surgery. HOME CARE INSTRUCTIONS For many people, back pain returns.Since low back pain is rarely dangerous, it is often a condition that people can learn to [facility] their own.   Remain active. It is stressful on the back to sit or stand in one place. Do not sit, drive, or stand in one place for more than 30 minutes at a time. Take short walks on level surfaces as soon as pain allows.Try to increase the length of time you walk each day.  Do not stay in bed.Resting more than 1 or 2 days can delay your recovery.  Do not avoid exercise or work.Your body is made to move.It is not dangerous to be active, even though your back may hurt.Your back will likely heal faster if you return to being active before your pain is gone.  Pay attention to your body when you bend and lift. Many people have less discomfortwhen lifting if they bend their knees, keep the load close to their bodies,and avoid twisting. Often, the most comfortable positions are those that put less stress on your recovering back.  Find a comfortable position to sleep. Use a firm mattress and lie on your side with your knees slightly bent. If you lie on your back, put a pillow under your knees.  Only take over-the-counter or prescription medicines as directed by your caregiver. Over-the-counter medicines to reduce pain and inflammation are often the most helpful.Your caregiver may prescribe muscle relaxant drugs.These medicines help dull your pain so you can more quickly return to your normal activities and healthy exercise.  Put ice on the injured area.  Put ice in a plastic bag.  Place a towel between your skin and the  bag.  Leave the ice on for 15-20 minutes, 03-04 times a day for the first 2 to 3 days. After that, ice and heat may be alternated to reduce pain and spasms.  Ask your caregiver about trying back exercises and gentle massage. This may be of some benefit.  Avoid feeling anxious or stressed.Stress increases muscle tension and can worsen back pain.It is important to recognize when you are anxious or stressed and learn ways to manage it.Exercise is a great option. SEEK MEDICAL CARE IF:  You have pain that is not relieved with rest or medicine.  You have pain that does not improve in 1 week.  You have new symptoms.  You are generally not feeling well. SEEK IMMEDIATE MEDICAL CARE IF:   You have pain that radiates from your back into your legs.  You develop new bowel or bladder control problems.  You have unusual weakness or numbness in your arms or legs.  You develop nausea or vomiting.  You develop abdominal pain.  You feel faint. Document Released: 10/31/2005 Document Revised: 05/01/2012 Document Reviewed: 03/21/2011 [facility] Patient Information 2014 [place], [place].  Knee Pain Knee pain can be a result of an injury or other medical conditions. Treatment will depend on the cause of your pain. HOME CARE  Only take medicine as told by your doctor.  Keep a healthy weight. Being overweight can make the knee hurt more.  Stretch before exercising or playing sports.  If there is constant knee pain, change the way you exercise. Ask your doctor for advice.  Make sure shoes fit well. Choose the right shoe for the sport or activity.  Protect your knees. Wear kneepads if needed.  Rest when you are tired. GET HELP RIGHT AWAY IF:   Your knee pain does not stop.  Your knee pain does not get better.  Your knee joint feels hot to the touch.  You have a fever. MAKE SURE YOU:   Understand these instructions.  Will watch this condition.  Will get help right away if you are  not doing well or get worse. Document Released: 01/27/2009 Document Revised: 01/23/2012 Document Reviewed: 01/27/2009 [facility] Patient Information 2014 [place], [place].

## 2014-03-06 NOTE — ED Provider Notes (Signed)
Medical screening examination/treatment/procedure(s) were performed by non-physician practitioner and as supervising physician I was immediately available for consultation/collaboration.   EKG Interpretation None        Derwin Reddy Y. Tomi Grandpre, MD 03/06/14 0913 

## 2014-03-28 ENCOUNTER — Emergency Department (HOSPITAL_COMMUNITY): Payer: Medicaid Other

## 2014-03-28 ENCOUNTER — Encounter (HOSPITAL_COMMUNITY): Payer: Self-pay | Admitting: Emergency Medicine

## 2014-03-28 ENCOUNTER — Emergency Department (HOSPITAL_COMMUNITY)
Admission: EM | Admit: 2014-03-28 | Discharge: 2014-03-29 | Disposition: A | Discharge status: Home / Self Care | Payer: Medicaid Other | Attending: Emergency Medicine | Admitting: Emergency Medicine

## 2014-03-28 DIAGNOSIS — Z8679 Personal history of other diseases of the circulatory system: Secondary | ICD-10-CM | POA: Insufficient documentation

## 2014-03-28 DIAGNOSIS — Z8659 Personal history of other mental and behavioral disorders: Secondary | ICD-10-CM | POA: Insufficient documentation

## 2014-03-28 DIAGNOSIS — J45909 Unspecified asthma, uncomplicated: Secondary | ICD-10-CM | POA: Insufficient documentation

## 2014-03-28 DIAGNOSIS — M940 Chondrocostal junction syndrome [Tietze]: Secondary | ICD-10-CM

## 2014-03-28 DIAGNOSIS — F17200 Nicotine dependence, unspecified, uncomplicated: Secondary | ICD-10-CM | POA: Insufficient documentation

## 2014-03-28 MED ORDER — IBUPROFEN 800 MG PO TABS
800.0000 mg | ORAL_TABLET | Freq: Once | ORAL | Status: AC
  Administered 2014-03-29: 800 mg via ORAL
  Filled 2014-03-28: qty 1

## 2014-03-28 NOTE — ED Provider Notes (Signed)
CSN: [id number]     Arrival date & time 03/28/14  2244 History   First MD Initiated Contact with Patient 03/28/14 2312     Chief Complaint  Patient presents with  . Chest Pain  . Pain    left axillary pain with deep breath     (Consider location/radiation/quality/duration/timing/severity/associated sxs/prior Treatment) HPI  33 year old female with history of migraine, anxiety, depression, and asthma who presents complaining of chest pain. Patient report about 2 hours ago while she was in the process of going to sleep when she experienced an acute onset of sharp chest pain localized to the left chest. Pain is nonradiating, worsening with taking deep breath. Pain has been persistence. She was concerned, called EMS and was recommended to take 4 baby aspirin. She did take the medication which provide very minimal improvement. She denies having any lightheadedness dizziness, nausea, diaphoresis, shortness of breath, productive cough, or hemoptysis. Pain is nonexertional. She denies any prior history of PE DVT, no recent surgery, no prolonged bed rest, no unilateral leg swelling or calf pain, no history of cancer, no history of taking birth control pill although she is on the Mirena IUD. She is a smoker without any significant family history of cardiac disease and no history of diabetes hypertension. She also denies any recent strenuous activities or any recent trauma.  Past Medical History  Diagnosis Date  . Preterm labor     c/s at 36 wks  . Placenta previa     hx and with current pregnancy  . Migraines     last one over 1 month ago  . Anxiety     no meds  . Depression     no meds  . Asthma   . ADHD (attention deficit hyperactivity disorder)   . Scoliosis    Past Surgical History  Procedure Laterality Date  . Cesarean section  2010    x 1 at 36 wks in [place]  . Svd  2003    x 1 in [place]  . Wisdom tooth extraction    . Tubal ligation     Family History  Problem Relation Age of  Onset  . Cancer Mother   . Hypertension Sister   . Sickle cell trait Daughter   . Asthma Son   . Asthma Paternal Grandmother   . Pseudochol deficiency Neg Hx   . Malignant hyperthermia Neg Hx   . Anesthesia problems Neg Hx   . Hypotension Neg Hx    History  Substance Use Topics  . Smoking status: Current Every Day Smoker -- 0.20 packs/day for 12 years    Types: Cigarettes  . Smokeless tobacco: Never Used  . Alcohol Use: No     Comment: PReviously drank on occ   OB History   Grav Para Term Preterm Abortions TAB SAB Ect Mult Living   3 3 1 2  0 0 0 0 0 3     Review of Systems  All other systems reviewed and are negative.     Allergies  Review of patient's allergies indicates no known allergies.  Home Medications   Prior to Admission medications   Medication Sig Start Date End Date Taking? Authorizing Provider  levonorgestrel (MIRENA) 20 MCG/24HR IUD 1 each by Intrauterine route continuous.    Yes Historical Provider, MD   BP 124/83  Pulse 94  Temp(Src) 97.5 F (36.4 C) (Oral)  Resp 16  SpO2 100%  LMP 02/27/2014 Physical Exam  Nursing note and vitals reviewed. Constitutional: She is  oriented to person, place, and time. She appears well-developed and well-nourished. No distress.  HENT:  Head: Atraumatic.  Mouth/Throat: Oropharynx is clear and moist.  Eyes: Conjunctivae are normal.  Neck: Neck supple. No JVD present.  Cardiovascular: Normal rate and regular rhythm.  Exam reveals no gallop and no friction rub.   No murmur heard. Pulmonary/Chest: Effort normal and breath sounds normal. She has no wheezes. She has no rales. She exhibits tenderness (Reproducible left anterior chest medially on palpation without emphysema, crepitus, or overlying skin changes.).  Abdominal: Soft. Bowel sounds are normal. There is no tenderness.  Musculoskeletal: She exhibits no edema.  Neurological: She is alert and oriented to person, place, and time.  Skin: No rash noted.   Psychiatric: She has a normal mood and affect.    ED Course  Procedures (including critical care time)  11:24 PM Pt with reproducible cp suggestive of costochondritis/MSK.  Is PERC negative, TIMI 0, is afebrile, VSS. EKG unremarkable without acute ischemic changes.  Will obtain CXR and give ibuprofen.      12:26 AM CXR unremarkable.  Recommend close f/u with PCP.  Doubt aortic dissection, esophageal tear, PE, MI, PTX, or other acute life threatening emergency.  Recommend strict return precaution.    Labs Review Labs Reviewed - No data to display  Imaging Review Dg Chest 2 View  03/29/2014   CLINICAL DATA:  Chest pain.  EXAM: CHEST  2 VIEW  COMPARISON:  12/06/2013.  FINDINGS: The cardiac silhouette remains borderline enlarged. Clear lungs. Moderate-to-marked scoliosis is unchanged.  IMPRESSION: No acute abnormality.   Electronically Signed   By: Enrique Sack M.D.   On: 03/29/2014 00:18     EKG Interpretation   Date/Time:  Friday Mar 28 2014 22:56:05 EDT Ventricular Rate:  91 PR Interval:  177 QRS Duration: 84 QT Interval:  367 QTC Calculation: 451 R Axis:   66 Text Interpretation:  Sinus rhythm ST elev, probable normal early repol  pattern Baseline wander in lead(s) II III aVF No significant change since  last tracing Confirmed by POLLINA  MD, CHRISTOPHER [phone]) on 03/28/2014  10:59:16 PM      MDM   Final diagnoses:  Costochondritis    BP 124/83  Pulse 94  Temp(Src) 97.5 F (36.4 C) (Oral)  Resp 16  SpO2 100%  LMP 03/04/2014  I have reviewed nursing notes and vital signs. I personally reviewed the imaging tests through PACS system  I reviewed available ER/hospitalization records thought the EMR     Domenic Moras, [place] 03/29/14 [id number]

## 2014-03-28 NOTE — ED Notes (Signed)
Pt c/o chest pain since 21:30 tonight. Pt sts she feels a throbbing pain. Pt denies SOB, dizziness, N/V, back pain, lightheadedness. Pt took 4 baby aspirin before coming to ED. A&Ox4. RN able to recreate pain through palpation of central chest and Left lateral side.

## 2014-03-28 NOTE — ED Notes (Signed)
Pt speaking in complete sentences. 

## 2014-03-28 NOTE — ED Notes (Signed)
Bed: WA06 Expected date:  Expected time:  Means of arrival:  Comments: 58F Amb/pain with breathing

## 2014-03-29 MED ORDER — IBUPROFEN 800 MG PO TABS: 800.0000 mg | ORAL_TABLET | Freq: Three times a day (TID) | ORAL | Status: DC

## 2014-03-29 NOTE — ED Provider Notes (Signed)
Medical screening examination/treatment/procedure(s) were performed by non-physician practitioner and as supervising physician I was immediately available for consultation/collaboration.   EKG Interpretation   Date/Time:  Friday Mar 28 2014 22:56:05 EDT Ventricular Rate:  91 PR Interval:  177 QRS Duration: 84 QT Interval:  367 QTC Calculation: 451 R Axis:   66 Text Interpretation:  Sinus rhythm ST elev, probable normal early repol  pattern Baseline wander in lead(s) II III aVF No significant change since  last tracing Confirmed by [facility]  MD, CHRISTOPHER [phone]) on 03/28/2014  10:59:16 PM       Teressa Lower, MD 03/29/14 [phone]

## 2014-03-29 NOTE — Discharge Instructions (Signed)

## 2014-04-08 ENCOUNTER — Emergency Department (HOSPITAL_COMMUNITY)
Admission: EM | Admit: 2014-04-08 | Discharge: 2014-04-09 | Disposition: A | Discharge status: Home / Self Care | Payer: Medicaid Other | Attending: Emergency Medicine | Admitting: Emergency Medicine

## 2014-04-08 ENCOUNTER — Encounter (HOSPITAL_COMMUNITY): Payer: Self-pay | Admitting: Emergency Medicine

## 2014-04-08 DIAGNOSIS — Z8742 Personal history of other diseases of the female genital tract: Secondary | ICD-10-CM | POA: Insufficient documentation

## 2014-04-08 DIAGNOSIS — Z8659 Personal history of other mental and behavioral disorders: Secondary | ICD-10-CM | POA: Insufficient documentation

## 2014-04-08 DIAGNOSIS — J45909 Unspecified asthma, uncomplicated: Secondary | ICD-10-CM | POA: Insufficient documentation

## 2014-04-08 DIAGNOSIS — G43909 Migraine, unspecified, not intractable, without status migrainosus: Secondary | ICD-10-CM

## 2014-04-08 DIAGNOSIS — Z8751 Personal history of pre-term labor: Secondary | ICD-10-CM | POA: Insufficient documentation

## 2014-04-08 DIAGNOSIS — Z791 Long term (current) use of non-steroidal anti-inflammatories (NSAID): Secondary | ICD-10-CM | POA: Insufficient documentation

## 2014-04-08 DIAGNOSIS — Z975 Presence of (intrauterine) contraceptive device: Secondary | ICD-10-CM | POA: Insufficient documentation

## 2014-04-08 DIAGNOSIS — M4120 Other idiopathic scoliosis, site unspecified: Secondary | ICD-10-CM | POA: Insufficient documentation

## 2014-04-08 DIAGNOSIS — F17200 Nicotine dependence, unspecified, uncomplicated: Secondary | ICD-10-CM | POA: Insufficient documentation

## 2014-04-08 MED ORDER — DEXAMETHASONE SODIUM PHOSPHATE 10 MG/ML IJ SOLN
10.0000 mg | Freq: Once | INTRAMUSCULAR | Status: AC
  Administered 2014-04-08: 10 mg via INTRAVENOUS

## 2014-04-08 MED ORDER — KETOROLAC TROMETHAMINE 60 MG/2ML IM SOLN
60.0000 mg | Freq: Once | INTRAMUSCULAR | Status: DC
  Filled 2014-04-08: qty 2

## 2014-04-08 MED ORDER — METOCLOPRAMIDE HCL 10 MG PO TABS
10.0000 mg | ORAL_TABLET | Freq: Once | ORAL | Status: AC
  Administered 2014-04-08: 10 mg via ORAL
  Filled 2014-04-08: qty 1

## 2014-04-08 MED ORDER — DIPHENHYDRAMINE HCL 25 MG PO CAPS
25.0000 mg | ORAL_CAPSULE | Freq: Once | ORAL | Status: AC
  Administered 2014-04-08: 25 mg via ORAL
  Filled 2014-04-08: qty 1

## 2014-04-08 MED ORDER — DEXAMETHASONE SODIUM PHOSPHATE 10 MG/ML IJ SOLN
10.0000 mg | Freq: Once | INTRAMUSCULAR | Status: DC
  Filled 2014-04-08: qty 1

## 2014-04-08 MED ORDER — KETOROLAC TROMETHAMINE 30 MG/ML IJ SOLN
30.0000 mg | Freq: Once | INTRAMUSCULAR | Status: AC
  Administered 2014-04-08: 30 mg via INTRAVENOUS
  Filled 2014-04-08: qty 1

## 2014-04-08 NOTE — ED Provider Notes (Signed)
CSN: [id number]     Arrival date & time 04/08/14  1945 History   First MD Initiated Contact with Patient 04/08/14 2214     Chief Complaint  Patient presents with  . Headache     (Consider location/radiation/quality/duration/timing/severity/associated sxs/prior Treatment) HPI Comments: 33 year old female with a past medical history of migraines, anxiety, depression and ADHD presents to the emergency department complaining of a headache intermittently x2 weeks worsening today. Headache began around 7:00 this morning, she is not doing anything in specific. Headache is located front of them poorly described as a burning sensation. States her headache is the same as her typical migraines. Admits to associated nausea without vomiting. Admits to photophobia and phonophobia. Denies fevers or neck pain. Denies vision changes. No alleviating factors. States she has seen a neurologist in the past, however it is in the process of changing over to a new primary care doctor and may try to find a new headache specialist.  Patient is a 33 y.o. female presenting with headaches. The history is provided by the patient.  Headache Associated symptoms: nausea and photophobia     Past Medical History  Diagnosis Date  . Preterm labor     c/s at 36 wks  . Placenta previa     hx and with current pregnancy  . Migraines     last one over 1 month ago  . Anxiety     no meds  . Depression     no meds  . Asthma   . ADHD (attention deficit hyperactivity disorder)   . Scoliosis    Past Surgical History  Procedure Laterality Date  . Cesarean section  2010    x 1 at 36 wks in [place]  . Svd  2003    x 1 in [place]  . Wisdom tooth extraction    . Tubal ligation     Family History  Problem Relation Age of Onset  . Cancer Mother   . Hypertension Sister   . Sickle cell trait Daughter   . Asthma Son   . Asthma Paternal Grandmother   . Pseudochol deficiency Neg Hx   . Malignant hyperthermia Neg Hx   .  Anesthesia problems Neg Hx   . Hypotension Neg Hx    History  Substance Use Topics  . Smoking status: Current Every Day Smoker -- 0.20 packs/day for 12 years    Types: Cigarettes  . Smokeless tobacco: Never Used  . Alcohol Use: No     Comment: PReviously drank on occ   OB History   Grav Para Term Preterm Abortions TAB SAB Ect Mult Living   3 3 1 2  0 0 0 0 0 3     Review of Systems  Eyes: Positive for photophobia.  Gastrointestinal: Positive for nausea.  Neurological: Positive for headaches.  All other systems reviewed and are negative.     Allergies  Review of patient's allergies indicates no known allergies.  Home Medications   Prior to Admission medications   Medication Sig Start Date End Date Taking? Authorizing Provider  ibuprofen (ADVIL,MOTRIN) 800 MG tablet Take 1 tablet (800 mg total) by mouth 3 (three) times daily. 03/29/14   Domenic Moras, PA-C  levonorgestrel (MIRENA) 20 MCG/24HR IUD 1 each by Intrauterine route continuous.     Historical Provider, MD   BP 119/81  Pulse 94  Temp(Src) 97.8 F (36.6 C) (Oral)  Resp 18  SpO2 97%  LMP 04/06/2014 Physical Exam  Nursing note and vitals reviewed. Constitutional: She  is oriented to person, place, and time. She appears well-developed and well-nourished. No distress.  Laying on the exam bed with lights off, no acute distress.  HENT:  Head: Normocephalic and atraumatic.  Mouth/Throat: Oropharynx is clear and moist.  Eyes: Conjunctivae and EOM are normal. Pupils are equal, round, and reactive to light.  Neck: Normal range of motion. Neck supple.  No meningeal signs.  Cardiovascular: Normal rate, regular rhythm and normal heart sounds.   Pulmonary/Chest: Effort normal and breath sounds normal.  Abdominal: Soft. Bowel sounds are normal. There is no tenderness.  Musculoskeletal: Normal range of motion. She exhibits no edema.  Neurological: She is alert and oriented to person, place, and time. She has normal strength. No  cranial nerve deficit or sensory deficit. She displays a negative Romberg sign. Coordination normal.  Speech fluent, goal oriented. Moves limbs without ataxia. Equal grip strength bilaterally.  Skin: Skin is warm and dry. She is not diaphoretic.  Psychiatric: She has a normal mood and affect. Her behavior is normal.    ED Course  Procedures (including critical care time) Labs Review Labs Reviewed - No data to display  Imaging Review No results found.   EKG Interpretation None      MDM   Final diagnoses:  Migraine    Patient presenting with headache, similar to her typical migraines. No red flags concerning patient's headache. Afebrile, no meningeal signs, no focal neurologic deficits. Plan to give migraine cocktail and reassess. 12:08 AM Pt reports improvement of her symptoms with toradol, decadron, benadryl and reglan. Headache 5/10 from 10/10. States she feels well enough to go home. Advised her to f/u with PCP to discuss migraine control. Stable for d/c. Return precautions given. Patient states understanding of treatment care plan and is agreeable.  Illene Labrador, PA-C 04/09/14 0009

## 2014-04-08 NOTE — ED Notes (Signed)
Pt presents with c/o headache that has been off and on for about 2 weeks. Pt says that today, this headache started around 7 this morning. Pt says that she is sensitive to light and nauseated at this time. Pt in NAD at this time.

## 2014-04-09 NOTE — Discharge Instructions (Signed)
Migraine Headache A migraine headache is an intense, throbbing pain on one or both sides of your head. A migraine can last for 30 minutes to several hours. CAUSES  The exact cause of a migraine headache is not always known. However, a migraine may be caused when nerves in the brain become irritated and release chemicals that cause inflammation. This causes pain. Certain things may also trigger migraines, such as:  Alcohol.  Smoking.  Stress.  Menstruation.  Aged cheeses.  Foods or drinks that contain nitrates, glutamate, aspartame, or tyramine.  Lack of sleep.  Chocolate.  Caffeine.  Hunger.  Physical exertion.  Fatigue.  Medicines used to treat chest pain (nitroglycerine), birth control pills, estrogen, and some blood pressure medicines. SIGNS AND SYMPTOMS  Pain on one or both sides of your head.  Pulsating or throbbing pain.  Severe pain that prevents daily activities.  Pain that is aggravated by any physical activity.  Nausea, vomiting, or both.  Dizziness.  Pain with exposure to bright lights, loud noises, or activity.  General sensitivity to bright lights, loud noises, or smells. Before you get a migraine, you may get warning signs that a migraine is coming (aura). An aura may include:  Seeing flashing lights.  Seeing bright spots, halos, or zig-zag lines.  Having tunnel vision or blurred vision.  Having feelings of numbness or tingling.  Having trouble talking.  Having muscle weakness. DIAGNOSIS  A migraine headache is often diagnosed based on:  Symptoms.  Physical exam.  A CT scan or MRI of your head. These imaging tests cannot diagnose migraines, but they can help rule out other causes of headaches. TREATMENT Medicines may be given for pain and nausea. Medicines can also be given to help prevent recurrent migraines.  HOME CARE INSTRUCTIONS  Only take over-the-counter or prescription medicines for pain or discomfort as directed by your  health care provider. The use of long-term narcotics is not recommended.  Lie down in a dark, quiet room when you have a migraine.  Keep a journal to find out what may trigger your migraine headaches. For example, write down:  What you eat and drink.  How much sleep you get.  Any change to your diet or medicines.  Limit alcohol consumption.  Quit smoking if you smoke.  Get 7 9 hours of sleep, or as recommended by your health care provider.  Limit stress.  Keep lights dim if bright lights bother you and make your migraines worse. SEEK IMMEDIATE MEDICAL CARE IF:   Your migraine becomes severe.  You have a fever.  You have a stiff neck.  You have vision loss.  You have muscular weakness or loss of muscle control.  You start losing your balance or have trouble walking.  You feel faint or pass out.  You have severe symptoms that are different from your first symptoms. MAKE SURE YOU:   Understand these instructions.  Will watch your condition.  Will get help right away if you are not doing well or get worse. Document Released: 10/31/2005 Document Revised: 08/21/2013 Document Reviewed: 07/08/2013 [employer] Patient Information 2014 [employer].  

## 2014-04-09 NOTE — ED Provider Notes (Signed)
Medical screening examination/treatment/procedure(s) were performed by non-physician practitioner and as supervising physician I was immediately available for consultation/collaboration.   EKG Interpretation None        Delice Bison Judy Pollman, DO 04/09/14 1453

## 2014-07-24 ENCOUNTER — Emergency Department (HOSPITAL_COMMUNITY)
Admission: EM | Admit: 2014-07-24 | Discharge: 2014-07-25 | Disposition: A | Discharge status: Home / Self Care | Payer: Medicaid Other | Attending: Emergency Medicine | Admitting: Emergency Medicine

## 2014-07-24 ENCOUNTER — Encounter (HOSPITAL_COMMUNITY): Payer: Self-pay | Admitting: Emergency Medicine

## 2014-07-24 DIAGNOSIS — F17200 Nicotine dependence, unspecified, uncomplicated: Secondary | ICD-10-CM | POA: Diagnosis not present

## 2014-07-24 DIAGNOSIS — J45909 Unspecified asthma, uncomplicated: Secondary | ICD-10-CM | POA: Diagnosis not present

## 2014-07-24 DIAGNOSIS — Z8659 Personal history of other mental and behavioral disorders: Secondary | ICD-10-CM | POA: Insufficient documentation

## 2014-07-24 DIAGNOSIS — N898 Other specified noninflammatory disorders of vagina: Secondary | ICD-10-CM | POA: Diagnosis not present

## 2014-07-24 DIAGNOSIS — G8929 Other chronic pain: Secondary | ICD-10-CM | POA: Insufficient documentation

## 2014-07-24 DIAGNOSIS — R110 Nausea: Secondary | ICD-10-CM | POA: Diagnosis present

## 2014-07-24 DIAGNOSIS — Z8739 Personal history of other diseases of the musculoskeletal system and connective tissue: Secondary | ICD-10-CM | POA: Diagnosis not present

## 2014-07-24 DIAGNOSIS — G4489 Other headache syndrome: Secondary | ICD-10-CM | POA: Insufficient documentation

## 2014-07-24 DIAGNOSIS — R1031 Right lower quadrant pain: Secondary | ICD-10-CM | POA: Insufficient documentation

## 2014-07-24 DIAGNOSIS — Z8679 Personal history of other diseases of the circulatory system: Secondary | ICD-10-CM | POA: Diagnosis not present

## 2014-07-24 DIAGNOSIS — Z3202 Encounter for pregnancy test, result negative: Secondary | ICD-10-CM | POA: Diagnosis not present

## 2014-07-24 LAB — CBC WITH DIFFERENTIAL/PLATELET
Basophils Absolute: 0 10*3/uL (ref 0.0–0.1)
Basophils Relative: 0 % (ref 0–1)
Eosinophils Absolute: 0.3 10*3/uL (ref 0.0–0.7)
Eosinophils Relative: 3 % (ref 0–5)
HCT: 44.3 % (ref 36.0–46.0)
Hemoglobin: 15.3 g/dL — ABNORMAL HIGH (ref 12.0–15.0)
Lymphocytes Relative: 27 % (ref 12–46)
Lymphs Abs: 2.7 10*3/uL (ref 0.7–4.0)
MCH: 29.7 pg (ref 26.0–34.0)
MCHC: 34.5 g/dL (ref 30.0–36.0)
MCV: 85.9 fL (ref 78.0–100.0)
Monocytes Absolute: 0.5 10*3/uL (ref 0.1–1.0)
Monocytes Relative: 6 % (ref 3–12)
Neutro Abs: 6.2 10*3/uL (ref 1.7–7.7)
Neutrophils Relative %: 64 % (ref 43–77)
Platelets: 300 10*3/uL (ref 150–400)
RBC: 5.16 MIL/uL — ABNORMAL HIGH (ref 3.87–5.11)
RDW: 13.5 % (ref 11.5–15.5)
WBC: 9.8 10*3/uL (ref 4.0–10.5)

## 2014-07-24 LAB — URINALYSIS, ROUTINE W REFLEX MICROSCOPIC
Bilirubin Urine: NEGATIVE
Glucose, UA: NEGATIVE mg/dL
Ketones, ur: NEGATIVE mg/dL
Leukocytes, UA: NEGATIVE
Nitrite: NEGATIVE
Protein, ur: NEGATIVE mg/dL
Specific Gravity, Urine: 1.022 (ref 1.005–1.030)
Urobilinogen, UA: 1 mg/dL (ref 0.0–1.0)
pH: 5.5 (ref 5.0–8.0)

## 2014-07-24 LAB — COMPREHENSIVE METABOLIC PANEL
ALT: 14 U/L (ref 0–35)
AST: 30 U/L (ref 0–37)
Albumin: 4.3 g/dL (ref 3.5–5.2)
Alkaline Phosphatase: 72 U/L (ref 39–117)
Anion gap: 14 (ref 5–15)
BUN: 10 mg/dL (ref 6–23)
CO2: 21 mEq/L (ref 19–32)
Calcium: 9.6 mg/dL (ref 8.4–10.5)
Chloride: 102 mEq/L (ref 96–112)
Creatinine, Ser: 0.55 mg/dL (ref 0.50–1.10)
GFR calc Af Amer: >90 mL/min (ref >90)
GFR calc non Af Amer: >90 mL/min (ref >90)
Glucose, Bld: 77 mg/dL (ref 70–99)
Potassium: 5.1 mEq/L (ref 3.7–5.3)
Sodium: 137 mEq/L (ref 137–147)
Total Bilirubin: 0.4 mg/dL (ref 0.3–1.2)
Total Protein: 8.5 g/dL — ABNORMAL HIGH (ref 6.0–8.3)

## 2014-07-24 LAB — WET PREP, GENITAL
Trich, Wet Prep: NONE SEEN
WBC, Wet Prep HPF POC: NONE SEEN
Yeast Wet Prep HPF POC: NONE SEEN

## 2014-07-24 LAB — PREGNANCY, URINE: Preg Test, Ur: NEGATIVE

## 2014-07-24 LAB — URINE MICROSCOPIC-ADD ON

## 2014-07-24 MED ORDER — ONDANSETRON HCL 4 MG/2ML IJ SOLN
4.0000 mg | Freq: Once | INTRAMUSCULAR | Status: AC
  Administered 2014-07-24: 4 mg via INTRAVENOUS
  Filled 2014-07-24: qty 2

## 2014-07-24 MED ORDER — MORPHINE SULFATE 4 MG/ML IJ SOLN
4.0000 mg | Freq: Once | INTRAMUSCULAR | Status: AC
  Administered 2014-07-24: 4 mg via INTRAVENOUS
  Filled 2014-07-24: qty 1

## 2014-07-24 MED ORDER — SODIUM CHLORIDE 0.9 % IV BOLUS (SEPSIS)
1000.0000 mL | Freq: Once | INTRAVENOUS | Status: AC
  Administered 2014-07-24: 1000 mL via INTRAVENOUS

## 2014-07-24 MED ORDER — SODIUM CHLORIDE 0.9 % IV SOLN: INTRAVENOUS | Status: DC

## 2014-07-24 NOTE — ED Notes (Addendum)
Pt c/o abdominal pain in RLQ, groin area, nausea, headache x 3 hours. Denies vomiting.Pt is AO x4. Also c/o neck and back pain

## 2014-07-24 NOTE — Discharge Instructions (Signed)

## 2014-07-24 NOTE — ED Provider Notes (Signed)
CSN: [id number]     Arrival date & time 07/24/14  1616 History   First MD Initiated Contact with Patient 07/24/14 1940     Chief Complaint  Patient presents with  . Nausea    3 hr of nausea and abdominal pain  . Neck Pain  . Abdominal Pain  . Groin Pain  . Back Pain     (Consider location/radiation/quality/duration/timing/severity/associated sxs/prior Treatment) HPI  Penny [name] is a 33 y.o. female who presents for evaluation of right lower quadrant abdominal pain, nausea, and headache for several hours. She also has intermittent and ongoing chronic neck and back pain. She denies fever or chills, cough, shortness of breath, or chest pain. Last menstrual period was one or 2 months ago, she cannot recall. There are no other known modifying factors.   Past Medical History  Diagnosis Date  . Preterm labor     c/s at 36 wks  . Placenta previa     hx and with current pregnancy  . Migraines     last one over 1 month ago  . Anxiety     no meds  . Depression     no meds  . Asthma   . ADHD (attention deficit hyperactivity disorder)   . Scoliosis    Past Surgical History  Procedure Laterality Date  . Cesarean section  2010    x 1 at 36 wks in [place]  . Svd  2003    x 1 in [place]  . Wisdom tooth extraction    . Tubal ligation     Family History  Problem Relation Age of Onset  . Cancer Mother   . Hypertension Sister   . Sickle cell trait Daughter   . Asthma Son   . Asthma Paternal Grandmother   . Pseudochol deficiency Neg Hx   . Malignant hyperthermia Neg Hx   . Anesthesia problems Neg Hx   . Hypotension Neg Hx    History  Substance Use Topics  . Smoking status: Current Every Day Smoker -- 0.20 packs/day for 12 years    Types: Cigarettes  . Smokeless tobacco: Never Used  . Alcohol Use: Yes     Comment: on occ   OB History   Grav Para Term Preterm Abortions TAB SAB Ect Mult Living   3 3 1 2  0 0 0 0 0 3     Review of Systems  All other systems reviewed and  are negative.     Allergies  Review of patient's allergies indicates no known allergies.  Home Medications   Prior to Admission medications   Medication Sig Start Date End Date Taking? Authorizing Provider  ibuprofen (ADVIL,MOTRIN) 200 MG tablet Take 800 mg by mouth every 6 (six) hours as needed for moderate pain.   Yes Historical Provider, MD  levonorgestrel (MIRENA) 20 MCG/24HR IUD 1 each by Intrauterine route continuous.     Historical Provider, MD   BP 144/98  Pulse 83  Temp(Src) 98 F (36.7 C) (Oral)  Resp 18  SpO2 100%  LMP 05/23/2014 Physical Exam  Nursing note and vitals reviewed. Constitutional: She is oriented to person, place, and time. She appears well-developed and well-nourished.  HENT:  Head: Normocephalic and atraumatic.  Eyes: Conjunctivae and EOM are normal. Pupils are equal, round, and reactive to light.  Neck: Normal range of motion and phonation normal. Neck supple.  No meningismus. She is able to touch her chin to her chest.  Cardiovascular: Normal rate, regular rhythm and intact distal  pulses.   Pulmonary/Chest: Effort normal and breath sounds normal. She exhibits no tenderness.  Abdominal: Soft. She exhibits no distension and no mass. There is tenderness (right upper quadrant, mild). There is no rebound and no guarding.  Genitourinary:  Normal external female genitalia. Normal amount of white, opaque vaginal discharge. No cervical motion tenderness. Cervix is retroflexed, and feels normal size. No adnexal tenderness, or mass.  Musculoskeletal: Normal range of motion.  Neurological: She is alert and oriented to person, place, and time. She exhibits normal muscle tone.  Skin: Skin is warm and dry.  Psychiatric: She has a normal mood and affect. Her behavior is normal. Judgment and thought content normal.    ED Course  Procedures (including critical care time)  Medications  0.9 %  sodium chloride infusion (not administered)  sodium chloride 0.9 %  bolus 1,000 mL (0 mLs Intravenous Stopped 07/24/14 2359)  morphine 4 MG/ML injection 4 mg (4 mg Intravenous Given 07/24/14 2054)  ondansetron (ZOFRAN) injection 4 mg (4 mg Intravenous Given 07/24/14 2054)    Patient Vitals for the past 24 hrs:  BP Temp Temp src Pulse Resp SpO2  07/24/14 2359 144/98 mmHg 98 F (36.7 C) Oral 83 18 100 %  07/24/14 2303 129/81 mmHg - - 80 18 100 %  07/24/14 2055 142/80 mmHg 97.7 F (36.5 C) Oral 75 18 100 %  07/24/14 1620 104/72 mmHg 97.6 F (36.4 C) Oral 81 20 98 %    11:29 PM Reevaluation with update and discussion. After initial assessment and treatment, an updated evaluation reveals she denies HA or neck pain at this time, and has minimal abdominal tenderness. Findings discussed with pt. All questions answered.Daleen Bo L   Labs Review Labs Reviewed  WET PREP, GENITAL - Abnormal; Notable for the following:    Clue Cells Wet Prep HPF POC FEW (*)    All other components within normal limits  CBC WITH DIFFERENTIAL - Abnormal; Notable for the following:    RBC 5.16 (*)    Hemoglobin 15.3 (*)    All other components within normal limits  COMPREHENSIVE METABOLIC PANEL - Abnormal; Notable for the following:    Total Protein 8.5 (*)    All other components within normal limits  URINALYSIS, ROUTINE W REFLEX MICROSCOPIC - Abnormal; Notable for the following:    Hgb urine dipstick MODERATE (*)    All other components within normal limits  URINE MICROSCOPIC-ADD ON - Abnormal; Notable for the following:    Squamous Epithelial / LPF FEW (*)    Bacteria, UA FEW (*)    All other components within normal limits  URINE CULTURE  GC/CHLAMYDIA PROBE AMP  PREGNANCY, URINE  RPR  HIV ANTIBODY (ROUTINE TESTING)    Imaging Review No results found.   EKG Interpretation None      MDM   Final diagnoses:  Other headache syndrome  Right lower quadrant abdominal pain    Nonspecific headache, and abdominal pain. Doubt meningitis, pneumonia, colitis,  appendicitis, PID or urinary tract infection.   Nursing Notes Reviewed/ Care Coordinated Applicable Imaging Reviewed Interpretation of Laboratory Data incorporated into ED treatment  The patient appears reasonably screened and/or stabilized for discharge and I doubt any other medical condition or other [facility] requiring further screening, evaluation, or treatment in the ED at this time prior to discharge.  Plan: Home Medications- none; Home Treatments- rest; return here if the recommended treatment, does not improve the symptoms; Recommended follow up- PCP prn    Richarda Blade,  MD 07/25/14 1031

## 2014-07-25 LAB — URINE CULTURE: Colony Count: 60000

## 2014-07-25 LAB — GC/CHLAMYDIA PROBE AMP
CT Probe RNA: NEGATIVE
GC Probe RNA: NEGATIVE

## 2014-07-25 LAB — RPR

## 2014-07-25 LAB — HIV ANTIBODY (ROUTINE TESTING W REFLEX): HIV 1&2 Ab, 4th Generation: NONREACTIVE

## 2014-08-05 ENCOUNTER — Encounter (HOSPITAL_COMMUNITY): Payer: Self-pay

## 2014-08-05 ENCOUNTER — Inpatient Hospital Stay (HOSPITAL_COMMUNITY)
Admission: AD | Admit: 2014-08-05 | Discharge: 2014-08-05 | Disposition: A | Discharge status: Home / Self Care | Payer: Medicaid Other | Source: Ambulatory Visit | Attending: Obstetrics and Gynecology | Admitting: Obstetrics and Gynecology

## 2014-08-05 ENCOUNTER — Inpatient Hospital Stay (HOSPITAL_COMMUNITY): Payer: Medicaid Other

## 2014-08-05 DIAGNOSIS — R1031 Right lower quadrant pain: Secondary | ICD-10-CM | POA: Insufficient documentation

## 2014-08-05 DIAGNOSIS — F17200 Nicotine dependence, unspecified, uncomplicated: Secondary | ICD-10-CM | POA: Insufficient documentation

## 2014-08-05 LAB — URINE MICROSCOPIC-ADD ON

## 2014-08-05 LAB — URINALYSIS, ROUTINE W REFLEX MICROSCOPIC
Bilirubin Urine: NEGATIVE
Glucose, UA: NEGATIVE mg/dL
Ketones, ur: NEGATIVE mg/dL
Leukocytes, UA: NEGATIVE
Nitrite: NEGATIVE
Protein, ur: NEGATIVE mg/dL
Specific Gravity, Urine: 1.025 (ref 1.005–1.030)
Urobilinogen, UA: 4 mg/dL — ABNORMAL HIGH (ref 0.0–1.0)
pH: 6 (ref 5.0–8.0)

## 2014-08-05 LAB — WET PREP, GENITAL
Clue Cells Wet Prep HPF POC: NONE SEEN
Trich, Wet Prep: NONE SEEN
Yeast Wet Prep HPF POC: NONE SEEN

## 2014-08-05 LAB — POCT PREGNANCY, URINE: Preg Test, Ur: NEGATIVE

## 2014-08-05 MED ORDER — OXYCODONE-ACETAMINOPHEN 5-325 MG PO TABS
2.0000 | ORAL_TABLET | Freq: Once | ORAL | Status: AC
  Administered 2014-08-05: 2 via ORAL
  Filled 2014-08-05: qty 2

## 2014-08-05 MED ORDER — OXYCODONE-ACETAMINOPHEN 2.5-325 MG PO TABS: 1.0000 | ORAL_TABLET | Freq: Four times a day (QID) | ORAL | Status: DC | PRN

## 2014-08-05 NOTE — MAU Provider Note (Signed)
Penny [name] is a 33 y.o. G3P3 CS x3, currently non pregnant, presents to MAU c/o severe pain RLQ since this afternoon and spotting since last week.  She reports her last baby was 2 years ago. She states on 11/15/11 her tubes were "clamped bc I wasn't sure if I wanted anymore children".  She con't to say in Feb of 2014 she has a Mirena placed.  She denies a regular period since the [place].  She denies, n/v d/c and has not taken any medication for the pain.   History     Patient Active Problem List   Diagnosis Date Noted  . Migraines   . Anxiety   . Depression   . 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 Abdominal pain complicating pregnancy 37/90/2409    Chief Complaint  Patient presents with  . Abdominal Pain   HPI  OB History   Grav Para Term Preterm Abortions TAB SAB Ect Mult Living   3 3 1 2  0 0 0 0 0 3      Past Medical History  Diagnosis Date  . Preterm labor     c/s at 36 wks  . Placenta previa     hx and with current pregnancy  . Migraines     last one over 1 month ago  . Anxiety     no meds  . Depression     no meds  . Asthma   . ADHD (attention deficit hyperactivity disorder)   . Scoliosis     Past Surgical History  Procedure Laterality Date  . Cesarean section  2010    x 1 at 36 wks in [place]  . Svd  2003    x 1 in [place]  . Wisdom tooth extraction    . Tubal ligation      Family History  Problem Relation Age of Onset  . Cancer Mother   . Hypertension Sister   . Sickle cell trait Daughter   . Asthma Son   . Asthma Paternal Grandmother   . Pseudochol deficiency Neg Hx   . Malignant hyperthermia Neg Hx   . Anesthesia problems Neg Hx   . Hypotension Neg Hx     History  Substance Use Topics  . Smoking status: Current Every Day Smoker -- 0.20 packs/day for 12 years    Types: Cigarettes  . Smokeless tobacco: Never Used  . Alcohol Use: Yes     Comment: on occ    Allergies: No Known  Allergies  Prescriptions prior to admission  Medication Sig Dispense Refill  . ibuprofen (ADVIL,MOTRIN) 200 MG tablet Take 800 mg by mouth every 6 (six) hours as needed for moderate pain.      Marland Kitchen levonorgestrel (MIRENA) 20 MCG/24HR IUD 1 each by Intrauterine route continuous.         ROS See HPI above, all other systems are negative  Physical Exam   Blood pressure 122/83, pulse 83, temperature 98.7 F (37.1 C), temperature source Oral, resp. rate 16, height 5\' 3"  (1.6 m), weight 80.74 kg (178 lb), last menstrual period 05/23/2014, SpO2 98.00%.  Physical Exam  Ext:  WNL ABD: Soft, severe pain and guarding to palpation on the right side SVE: WNL   ED Course  Assessment: Possible infection  Plan: Labs: UA, wet prep, GC/CT, HIV Pelvic US Percocet 2 tabs UPT    Keauna Brasel, CNM, MSN 08/05/2014. 6:00 PM

## 2014-08-05 NOTE — MAU Provider Note (Signed)
History   Continuation of V. Standard note and assessment.  Patient is 32y.o. who presents with right lower abdominal pain.  Reports pain started at 1345 after having a bowel movement.  Patient states pain was dull pounding sensation prior to taking percocet at hospital.Patient reports not taking any medication prior to arrival and states pain was 10/10. Patient reports regular bowel movements everyday and had 2 today stating last one was while at hospital, no problems. No history of IBS, chron's disease, or diverticulitis.  Patient also denies history of gallstones, gallbladder issues, recent UTIs, appendicitis, nausea, and vomiting.  Patient reports abnormal menstral cycles due to Mirena, but is currently spotting since Thursday. PCP Pattison MD Last seen in May 2015.  Patient Active Problem List   Diagnosis Date Noted  . Migraines   . Anxiety   . Depression   . 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 Abdominal pain complicating pregnancy 37/90/2409    Chief Complaint  Patient presents with  . Abdominal Pain   HPI  OB History   Grav Para Term Preterm Abortions TAB SAB Ect Mult Living   3 3 1 2  0 0 0 0 0 3      Past Medical History  Diagnosis Date  . Preterm labor     c/s at 36 wks  . Placenta previa     hx and with current pregnancy  . Migraines     last one over 1 month ago  . Anxiety     no meds  . Depression     no meds  . Asthma   . ADHD (attention deficit hyperactivity disorder)   . Scoliosis     Past Surgical History  Procedure Laterality Date  . Cesarean section  2010    x 1 at 36 wks in [place]  . Svd  2003    x 1 in [place]  . Wisdom tooth extraction    . Tubal ligation      Family History  Problem Relation Age of Onset  . Cancer Mother   . Hypertension Sister   . Sickle cell trait Daughter   . Asthma Son   . Asthma Paternal Grandmother   . Pseudochol deficiency Neg Hx   . Malignant hyperthermia Neg  Hx   . Anesthesia problems Neg Hx   . Hypotension Neg Hx     History  Substance Use Topics  . Smoking status: Current Every Day Smoker -- 0.20 packs/day for 12 years    Types: Cigarettes  . Smokeless tobacco: Never Used  . Alcohol Use: Yes     Comment: on occ    Allergies: No Known Allergies  Prescriptions prior to admission  Medication Sig Dispense Refill  . ibuprofen (ADVIL,MOTRIN) 200 MG tablet Take 800 mg by mouth every 6 (six) hours as needed for moderate pain.      Marland Kitchen levonorgestrel (MIRENA) 20 MCG/24HR IUD 1 each by Intrauterine route continuous.         ROS  See HPI Above Physical Exam   Blood pressure 122/83, pulse 83, temperature 98.7 F (37.1 C), temperature source Oral, resp. rate 16, height 5\' 3"  (1.6 m), weight 178 lb (80.74 kg), last menstrual period 05/23/2014, SpO2 98.00%.  Physical Exam  Constitutional: She is oriented to person, place, and time. She appears well-developed and well-nourished. No distress.  Cardiovascular: Normal rate, regular rhythm and normal heart sounds.   Respiratory: Effort normal and breath sounds normal.  GI: Soft.  Bowel sounds are normal. She exhibits no distension and no mass. There is tenderness in the right lower quadrant and left lower quadrant. There is rebound. There is no rigidity, no guarding, no CVA tenderness, no tenderness at McBurney's point and negative Murphy's sign.  Genitourinary:  Deferred  Musculoskeletal:       Lumbar back: She exhibits tenderness.  Neurological: She is alert and oriented to person, place, and time.  Skin: Skin is warm and dry.    ED Course  Assessment: 32y.o. Female RLQ Pain  Plan: -[place] Results-FINDINGS: Uterus Measurements: 7.5 x 5.3 x 4.1 cm. Anteverted, retroflexed. This renders visualization of the fundus minimally suboptimal. No fibroids or other mass visualized. Endometrium Thickness: 4 mm. Trace fluid in the endometrial canal. IUD appropriately located. No focal abnormality visualized.   Right ovary Measurements: 4.4 x 2.7 x 2.3 cm. Normal appearance/no adnexal mass. Left ovary Measurements: 4.0 x 1.6 x 1.5 cm. Normal appearance/no adnexal mass.  Other findings Trace free fluid in the cul de sac IMPRESSION: Normal exam. IUD appropriately located. -Discussed [place] results and possibility of free fluid causing minor irritation, however cause of pain unknown at current. -Discussed follow up with PCP in 2-3 days if pain continues -Rx for Percocet 1-2tabs Q6hrs prn Disp 14, RF 0 -Of note, patient did inquire about dosage of oxycodone (5mg  or 10mg ) in percocet prescription -Discharged to home in stable condition  Kylon Philbrook, [place], MSN 08/05/2014 8:11 PM

## 2014-08-05 NOTE — Discharge Instructions (Signed)

## 2014-08-05 NOTE — MAU Note (Signed)
Patient states she started having right lower abdominal pain about 1345 today. Denies nausea, vomiting, diarrhea. Has spotting and thinks cycle is trying to come. Has a Mirena IUD for about two years

## 2014-08-06 LAB — GC/CHLAMYDIA PROBE AMP
CT Probe RNA: NEGATIVE
GC Probe RNA: NEGATIVE

## 2014-08-06 LAB — HIV ANTIBODY (ROUTINE TESTING W REFLEX): HIV 1&2 Ab, 4th Generation: NONREACTIVE

## 2014-09-15 ENCOUNTER — Encounter (HOSPITAL_COMMUNITY): Payer: Self-pay | Admitting: Emergency Medicine

## 2014-09-15 ENCOUNTER — Emergency Department (HOSPITAL_COMMUNITY)
Admission: EM | Admit: 2014-09-15 | Discharge: 2014-09-15 | Disposition: A | Discharge status: Home / Self Care | Payer: Medicaid Other | Attending: Emergency Medicine | Admitting: Emergency Medicine

## 2014-09-15 ENCOUNTER — Emergency Department (HOSPITAL_COMMUNITY): Payer: Medicaid Other

## 2014-09-15 DIAGNOSIS — Z7952 Long term (current) use of systemic steroids: Secondary | ICD-10-CM | POA: Insufficient documentation

## 2014-09-15 DIAGNOSIS — Z8679 Personal history of other diseases of the circulatory system: Secondary | ICD-10-CM | POA: Diagnosis not present

## 2014-09-15 DIAGNOSIS — Z793 Long term (current) use of hormonal contraceptives: Secondary | ICD-10-CM | POA: Insufficient documentation

## 2014-09-15 DIAGNOSIS — Z8659 Personal history of other mental and behavioral disorders: Secondary | ICD-10-CM | POA: Insufficient documentation

## 2014-09-15 DIAGNOSIS — Z8739 Personal history of other diseases of the musculoskeletal system and connective tissue: Secondary | ICD-10-CM | POA: Insufficient documentation

## 2014-09-15 DIAGNOSIS — J069 Acute upper respiratory infection, unspecified: Secondary | ICD-10-CM | POA: Diagnosis not present

## 2014-09-15 DIAGNOSIS — R059 Cough, unspecified: Secondary | ICD-10-CM

## 2014-09-15 DIAGNOSIS — Z720 Tobacco use: Secondary | ICD-10-CM | POA: Insufficient documentation

## 2014-09-15 DIAGNOSIS — R05 Cough: Secondary | ICD-10-CM

## 2014-09-15 DIAGNOSIS — J45909 Unspecified asthma, uncomplicated: Secondary | ICD-10-CM | POA: Insufficient documentation

## 2014-09-15 MED ORDER — DEXAMETHASONE 4 MG PO TABS: 4.0000 mg | ORAL_TABLET | Freq: Two times a day (BID) | ORAL | Status: DC

## 2014-09-15 MED ORDER — DEXAMETHASONE 6 MG PO TABS
12.0000 mg | ORAL_TABLET | Freq: Once | ORAL | Status: AC
  Administered 2014-09-15: 12 mg via ORAL
  Filled 2014-09-15: qty 2

## 2014-09-15 MED ORDER — BENZONATATE 100 MG PO CAPS: 100.0000 mg | ORAL_CAPSULE | Freq: Three times a day (TID) | ORAL | Status: DC | PRN

## 2014-09-15 MED ORDER — BENZONATATE 100 MG PO CAPS
100.0000 mg | ORAL_CAPSULE | Freq: Once | ORAL | Status: AC
  Administered 2014-09-15: 100 mg via ORAL
  Filled 2014-09-15: qty 1

## 2014-09-15 NOTE — Discharge Instructions (Signed)

## 2014-09-15 NOTE — ED Notes (Signed)
Patient transported to X-ray 

## 2014-09-15 NOTE — ED Notes (Signed)
MD at bedside to discuss d/c instructions

## 2014-09-15 NOTE — ED Notes (Signed)
Awake. Verbally responsive. Resp even and unlabored. Occ nonproductive cough. ABC's intact.

## 2014-09-15 NOTE — ED Notes (Signed)
Pt states she has lost her voice, states her chest hurts to breathe, sore throat, cough that started on Friday  Pt states she had been drinking orange juice and since then she has been feeling worse

## 2014-09-19 ENCOUNTER — Emergency Department (HOSPITAL_COMMUNITY)
Admission: EM | Admit: 2014-09-19 | Discharge: 2014-09-19 | Disposition: A | Discharge status: Home / Self Care | Payer: Medicaid Other | Attending: Emergency Medicine | Admitting: Emergency Medicine

## 2014-09-19 ENCOUNTER — Emergency Department (HOSPITAL_COMMUNITY): Payer: Medicaid Other

## 2014-09-19 ENCOUNTER — Encounter (HOSPITAL_COMMUNITY): Payer: Self-pay | Admitting: Emergency Medicine

## 2014-09-19 DIAGNOSIS — Z8659 Personal history of other mental and behavioral disorders: Secondary | ICD-10-CM | POA: Diagnosis not present

## 2014-09-19 DIAGNOSIS — Z8679 Personal history of other diseases of the circulatory system: Secondary | ICD-10-CM | POA: Diagnosis not present

## 2014-09-19 DIAGNOSIS — R059 Cough, unspecified: Secondary | ICD-10-CM

## 2014-09-19 DIAGNOSIS — R05 Cough: Secondary | ICD-10-CM

## 2014-09-19 DIAGNOSIS — Z720 Tobacco use: Secondary | ICD-10-CM | POA: Diagnosis not present

## 2014-09-19 DIAGNOSIS — Z7952 Long term (current) use of systemic steroids: Secondary | ICD-10-CM | POA: Insufficient documentation

## 2014-09-19 DIAGNOSIS — M419 Scoliosis, unspecified: Secondary | ICD-10-CM | POA: Diagnosis not present

## 2014-09-19 DIAGNOSIS — J45909 Unspecified asthma, uncomplicated: Secondary | ICD-10-CM | POA: Diagnosis not present

## 2014-09-19 DIAGNOSIS — J069 Acute upper respiratory infection, unspecified: Secondary | ICD-10-CM | POA: Diagnosis not present

## 2014-09-19 MED ORDER — GUAIFENESIN-CODEINE 100-10 MG/5ML PO SOLN: 5.0000 mL | Freq: Three times a day (TID) | ORAL | Status: DC | PRN

## 2014-09-19 NOTE — ED Provider Notes (Signed)
CSN: [id number]     Arrival date & time 09/19/14  0440 History   First MD Initiated Contact with Patient 09/19/14 [id number]     Chief Complaint  Patient presents with  . Cough     (Consider location/radiation/quality/duration/timing/severity/associated sxs/prior Treatment) HPI Comments: Patient presents today with a chief complaint of cough.  She reports that the cough has been present for the past week and is gradually worsening.  She reports that the cough is productive of sputum and today she noticed specks of blood in her sputum.  She has been taking Tessalon Perles and Dayquil for her symptoms without improvement.  She reports associated nasal congestion.  She denies chest pain, SOB, fever, chills, ear pain, or sinus pain.  Denies prolonged travel or surgeries in the past 4 weeks.  Denies prior history of DVT or PE.  Denies any use of estrogen containing medications.  She currently smokes 0.2 PPD.  The history is provided by the patient.    Past Medical History  Diagnosis Date  . Preterm labor     c/s at 36 wks  . Placenta previa     hx and with current pregnancy  . Migraines     last one over 1 month ago  . Anxiety     no meds  . Depression     no meds  . Asthma   . ADHD (attention deficit hyperactivity disorder)   . Scoliosis    Past Surgical History  Procedure Laterality Date  . Cesarean section  2010    x 1 at 36 wks in [place]  . Svd  2003    x 1 in [place]  . Wisdom tooth extraction    . Tubal ligation     Family History  Problem Relation Age of Onset  . Cancer Mother   . Hypertension Sister   . Sickle cell trait Daughter   . Asthma Son   . Asthma Paternal Grandmother   . Pseudochol deficiency Neg Hx   . Malignant hyperthermia Neg Hx   . Anesthesia problems Neg Hx   . Hypotension Neg Hx    History  Substance Use Topics  . Smoking status: Current Every Day Smoker -- 0.20 packs/day for 12 years    Types: Cigarettes  . Smokeless tobacco: Never Used  . Alcohol  Use: Yes     Comment: on occ   OB History    Gravida Para Term Preterm AB TAB SAB Ectopic Multiple Living   3 3 1 2  0 0 0 0 0 3     Review of Systems  All other systems reviewed and are negative.     Allergies  Review of patient's allergies indicates no known allergies.  Home Medications   Prior to Admission medications   Medication Sig Start Date End Date Taking? Authorizing Provider  levonorgestrel (MIRENA) 20 MCG/24HR IUD 1 each by Intrauterine route continuous.    Yes Historical Provider, MD  benzonatate (TESSALON PERLES) 100 MG capsule Take 1 capsule (100 mg total) by mouth 3 (three) times daily as needed for cough. 09/15/14   Virgel Manifold, MD  dexamethasone (DECADRON) 4 MG tablet Take 1 tablet (4 mg total) by mouth 2 (two) times daily. 09/15/14   Virgel Manifold, MD  ibuprofen (ADVIL,MOTRIN) 200 MG tablet Take 800 mg by mouth every 6 (six) hours as needed for moderate pain.    Historical Provider, MD  oxycodone-acetaminophen (PERCOCET) 2.5-325 MG per tablet Take 1-2 tablets by mouth every 6 (six) hours  as needed for pain. 08/05/14   Gavin Pound, CNM   BP 107/67 mmHg  Pulse 70  Temp(Src) 98.4 F (36.9 C) (Oral)  Resp 16  Ht 5\' 4"  (1.626 m)  Wt 180 lb (81.647 kg)  BMI 30.88 kg/m2  SpO2 97%  LMP 09/05/2014 (Approximate) Physical Exam  Constitutional: She appears well-developed and well-nourished. No distress.  HENT:  Head: Normocephalic and atraumatic.  Right Ear: Tympanic membrane and ear canal normal.  Left Ear: Tympanic membrane and ear canal normal.  Nose: Nose normal. Right sinus exhibits no maxillary sinus tenderness and no frontal sinus tenderness. Left sinus exhibits no maxillary sinus tenderness and no frontal sinus tenderness.  Mouth/Throat: Oropharynx is clear and moist.  Neck: Normal range of motion. Neck supple.  Cardiovascular: Normal rate, regular rhythm and normal heart sounds.   Pulmonary/Chest: Effort normal and breath sounds normal. No respiratory  distress. She has no wheezes. She has no rales.  Musculoskeletal: Normal range of motion.  Neurological: She is alert.  Skin: Skin is warm and dry. She is not diaphoretic.  Psychiatric: She has a normal mood and affect.  Nursing note and vitals reviewed.   ED Course  Procedures (including critical care time) Labs Review Labs Reviewed - No data to display  Imaging Review Dg Chest 2 View  09/19/2014   CLINICAL DATA:  Cough for 1 week.  Dyspnea.  Mid chest soreness.  EXAM: CHEST  2 VIEW  COMPARISON:  09/15/2014  FINDINGS: Normal heart size and pulmonary vascularity. No focal airspace disease or consolidation in the lungs. No blunting of costophrenic angles. No pneumothorax. Mediastinal contours appear intact. Thoracic scoliosis convex towards the right.  IMPRESSION: No evidence of active pulmonary disease.   Electronically Signed   By: Lucienne Capers M.D.   On: 09/19/2014 05:47     EKG Interpretation None      MDM   Final diagnoses:  Cough   Patient presents today with a cough that has been present for one week.  She noticed small specks of blood in her sputum this morning.  Patient is not hypoxic.  Pulse ox 97 on RA.  Lungs CTAB.  CXR is negative.  No chest pain or SOB.  No risk factors for PE.  Patient stable for discharge.  Return precautions given.      Hyman Bible, PA-C 09/19/14 [place], MD 09/22/14 [phone]

## 2014-09-19 NOTE — ED Notes (Signed)
Pt. arrived with EMS from home reports progressing productive cough with blood tinged phlegm , chest congestion/tightness , seen at [facility] ER last week  diagnosed with viral URI prescribed with Tessalon Pearles and Decadron tabs with no improvement , denies fever or chills.

## 2014-09-19 NOTE — ED Notes (Signed)
pts 3 children given sandwiches drinks, pt given meal and cab voucher to go home pt d.c ambulatory

## 2014-09-19 NOTE — ED Notes (Signed)
Pt states that she was given tessalon pearls at [facility] long that has not been working. She says the cough has gotten worse and she is now coughing up blood.

## 2014-09-19 NOTE — Discharge Instructions (Signed)
Read the instructions below on reasons to return to the emergency department and to learn more about your diagnosis.  Use over the counter medications for symptomatic relief as we discussed (mucinex as a decongestant, Tylenol for fever/pain, Motrin/Ibuprofen for muscle aches). If prescribed a cough suppressant during your visit, do not operate heavy machinery with in 5 hours of taking this medication. Followup with your primary care doctor in 4 days if your symptoms persist.  Your more than welcome to return to the emergency department if symptoms worsen or become concerning. ° °Upper Respiratory Infection, Adult  °An upper respiratory infection (URI) is also sometimes known as the common cold. Most people improve within 1 week, but symptoms can last up to 2 weeks. A residual cough may last even longer.  ° °URI is most commonly caused by a virus. Viruses are NOT treated with antibiotics. You can easily spread the virus to others by oral contact. This includes kissing, sharing a glass, coughing, or sneezing. Touching your mouth or nose and then touching a surface, which is then touched by another person, can also spread the virus.  ° °TREATMENT  °Treatment is directed at relieving symptoms. There is no cure. Antibiotics are not effective, because the infection is caused by a virus, not by bacteria. Treatment may include:  °Increased fluid intake. Sports drinks offer valuable electrolytes, sugars, and fluids.  °Breathing heated mist or steam (vaporizer or shower).  °Eating chicken soup or other clear broths, and maintaining good nutrition.  °Getting plenty of rest.  °Using gargles or lozenges for comfort.  °Controlling fevers with ibuprofen or acetaminophen as directed by your caregiver.  °Increasing usage of your inhaler if you have asthma.  °Return to work when your temperature has returned to normal.  ° °SEEK MEDICAL CARE IF:  °After the first few days, you feel you are getting worse rather than better.  °You  develop worsening shortness of breath, or brown or red sputum. These may be signs of pneumonia.  °You develop yellow or brown nasal discharge or pain in the face, especially when you bend forward. These may be signs of sinusitis.  °You develop a fever, swollen neck glands, pain with swallowing, or white areas in the back of your throat. These may be signs of strep throat.  ° °

## 2014-09-24 NOTE — ED Provider Notes (Signed)
CSN: [id number]     Arrival date & time 09/15/14  0030 History   First MD Initiated Contact with Patient 09/15/14 0307     No chief complaint on file.    (Consider location/radiation/quality/duration/timing/severity/associated sxs/prior Treatment) HPI   33 year old female with cough and sore throat. Symptom onset on Friday. Progressively worsening. Chest feels sore. Cough is occasionally productive for clear to whitish sputum. She has lost her voice. Subjective fever. No unusual leg pain or swelling. Patient with no acute interventions aside from trying orange juice to help her sore throat. She feels like this only worsened. No difficulty swallowing. No drooling.multiple children have been sick recently with similar type symptoms.  Past Medical History  Diagnosis Date  . Preterm labor     c/s at 36 wks  . Placenta previa     hx and with current pregnancy  . Migraines     last one over 1 month ago  . Anxiety     no meds  . Depression     no meds  . Asthma   . ADHD (attention deficit hyperactivity disorder)   . Scoliosis    Past Surgical History  Procedure Laterality Date  . Cesarean section  2010    x 1 at 36 wks in [place]  . Svd  2003    x 1 in [place]  . Wisdom tooth extraction    . Tubal ligation     Family History  Problem Relation Age of Onset  . Cancer Mother   . Hypertension Sister   . Sickle cell trait Daughter   . Asthma Son   . Asthma Paternal Grandmother   . Pseudochol deficiency Neg Hx   . Malignant hyperthermia Neg Hx   . Anesthesia problems Neg Hx   . Hypotension Neg Hx    History  Substance Use Topics  . Smoking status: Current Every Day Smoker -- 0.20 packs/day for 12 years    Types: Cigarettes  . Smokeless tobacco: Never Used  . Alcohol Use: Yes     Comment: on occ   OB History    Gravida Para Term Preterm AB TAB SAB Ectopic Multiple Living   3 3 1 2  0 0 0 0 0 3     Review of Systems  All systems reviewed and negative, other than as noted  in HPI.   Allergies  Review of patient's allergies indicates no known allergies.  Home Medications   Prior to Admission medications   Medication Sig Start Date End Date Taking? Authorizing Provider  levonorgestrel (MIRENA) 20 MCG/24HR IUD 1 each by Intrauterine route continuous.    Yes Historical Provider, MD  benzonatate (TESSALON PERLES) 100 MG capsule Take 1 capsule (100 mg total) by mouth 3 (three) times daily as needed for cough. 09/15/14   Virgel Manifold, MD  dexamethasone (DECADRON) 4 MG tablet Take 1 tablet (4 mg total) by mouth 2 (two) times daily. 09/15/14   Virgel Manifold, MD  guaiFENesin-codeine 100-10 MG/5ML syrup Take 5 mLs by mouth 3 (three) times daily as needed for cough. 09/19/14   Heather Laisure, PA-C  ibuprofen (ADVIL,MOTRIN) 200 MG tablet Take 800 mg by mouth every 6 (six) hours as needed for moderate pain.    Historical Provider, MD  oxycodone-acetaminophen (PERCOCET) 2.5-325 MG per tablet Take 1-2 tablets by mouth every 6 (six) hours as needed for pain. 08/05/14   Gavin Pound, CNM   BP 116/76 mmHg  Pulse 79  Temp(Src) 97.9 F (36.6 C) (Oral)  Resp  18  Ht 5\' 4"  (1.626 m)  Wt 180 lb 6.4 oz (81.829 kg)  BMI 30.95 kg/m2  SpO2 100%  LMP 09/05/2014 (Approximate) Physical Exam  Constitutional: She appears well-developed and well-nourished. No distress.  HENT:  Head: Normocephalic and atraumatic.  Eyes: Conjunctivae are normal. Right eye exhibits no discharge. Left eye exhibits no discharge.  Neck: Neck supple.  Cardiovascular: Normal rate, regular rhythm and normal heart sounds.  Exam reveals no gallop and no friction rub.   No murmur heard. Pulmonary/Chest: Effort normal and breath sounds normal. No respiratory distress.  Abdominal: Soft. She exhibits no distension. There is no tenderness.  Musculoskeletal: She exhibits no edema or tenderness.  Lower extremities symmetric as compared to each other. No calf tenderness. Negative Homan's. No palpable cords.    Neurological: She is alert.  Skin: Skin is warm and dry.  Psychiatric: She has a normal mood and affect. Her behavior is normal. Thought content normal.  Nursing note and vitals reviewed.   ED Course  Procedures (including critical care time) Labs Review Labs Reviewed - No data to display  Imaging Review No results found.   EKG Interpretation None      MDM   Final diagnoses:  Cough  Viral URI   33 year old female with cough.no increased work of breathing. Chest x-ray is clear. Suspect viral illness. Plan symptomatic treatment. Return precautions were discussed.   Virgel Manifold, MD 09/24/14 [phone]

## 2014-10-06 ENCOUNTER — Emergency Department (HOSPITAL_COMMUNITY)
Admission: EM | Admit: 2014-10-06 | Discharge: 2014-10-06 | Disposition: A | Discharge status: Home / Self Care | Payer: Medicaid Other | Attending: Emergency Medicine | Admitting: Emergency Medicine

## 2014-10-06 ENCOUNTER — Emergency Department (HOSPITAL_COMMUNITY): Payer: Medicaid Other

## 2014-10-06 ENCOUNTER — Encounter (HOSPITAL_COMMUNITY): Payer: Self-pay | Admitting: Emergency Medicine

## 2014-10-06 DIAGNOSIS — M419 Scoliosis, unspecified: Secondary | ICD-10-CM | POA: Insufficient documentation

## 2014-10-06 DIAGNOSIS — Z8679 Personal history of other diseases of the circulatory system: Secondary | ICD-10-CM | POA: Insufficient documentation

## 2014-10-06 DIAGNOSIS — J45909 Unspecified asthma, uncomplicated: Secondary | ICD-10-CM | POA: Diagnosis not present

## 2014-10-06 DIAGNOSIS — R05 Cough: Secondary | ICD-10-CM

## 2014-10-06 DIAGNOSIS — Z8659 Personal history of other mental and behavioral disorders: Secondary | ICD-10-CM | POA: Insufficient documentation

## 2014-10-06 DIAGNOSIS — Z79899 Other long term (current) drug therapy: Secondary | ICD-10-CM | POA: Diagnosis not present

## 2014-10-06 DIAGNOSIS — R52 Pain, unspecified: Secondary | ICD-10-CM | POA: Diagnosis present

## 2014-10-06 DIAGNOSIS — J069 Acute upper respiratory infection, unspecified: Secondary | ICD-10-CM | POA: Diagnosis not present

## 2014-10-06 DIAGNOSIS — B349 Viral infection, unspecified: Secondary | ICD-10-CM | POA: Diagnosis not present

## 2014-10-06 DIAGNOSIS — Z3202 Encounter for pregnancy test, result negative: Secondary | ICD-10-CM | POA: Diagnosis not present

## 2014-10-06 DIAGNOSIS — Z720 Tobacco use: Secondary | ICD-10-CM | POA: Diagnosis not present

## 2014-10-06 DIAGNOSIS — R059 Cough, unspecified: Secondary | ICD-10-CM

## 2014-10-06 LAB — RAPID STREP SCREEN (MED CTR MEBANE ONLY): Streptococcus, Group A Screen (Direct): NEGATIVE

## 2014-10-06 LAB — URINE MICROSCOPIC-ADD ON

## 2014-10-06 LAB — URINALYSIS, ROUTINE W REFLEX MICROSCOPIC
Bilirubin Urine: NEGATIVE
Glucose, UA: NEGATIVE mg/dL
Ketones, ur: NEGATIVE mg/dL
Nitrite: NEGATIVE
Protein, ur: NEGATIVE mg/dL
Specific Gravity, Urine: 1.026 (ref 1.005–1.030)
Urobilinogen, UA: 0.2 mg/dL (ref 0.0–1.0)
pH: 5 (ref 5.0–8.0)

## 2014-10-06 LAB — PREGNANCY, URINE: Preg Test, Ur: NEGATIVE

## 2014-10-06 MED ORDER — BENZONATATE 100 MG PO CAPS: 100.0000 mg | ORAL_CAPSULE | Freq: Three times a day (TID) | ORAL | Status: DC | PRN

## 2014-10-06 MED ORDER — OXYCODONE-ACETAMINOPHEN 5-325 MG PO TABS: ORAL_TABLET | ORAL | Status: DC

## 2014-10-06 MED ORDER — OXYCODONE-ACETAMINOPHEN 5-325 MG PO TABS
1.0000 | ORAL_TABLET | Freq: Once | ORAL | Status: AC
  Administered 2014-10-06: 1 via ORAL
  Filled 2014-10-06: qty 1

## 2014-10-06 NOTE — ED Provider Notes (Signed)
CSN: [id number]     Arrival date & time 10/06/14  1144 History   First MD Initiated Contact with Patient 10/06/14 1213     Chief Complaint  Patient presents with  . Generalized Body Aches     HPI Pt was seen at 1215.  Per pt, c/o gradual onset and persistence of constant right ear pain, sore throat, runny/stuffy nose, sinus congestion, and cough for the past several hours. Has been associated with generalized body aches/fatigue. Denies fevers, no rash, no CP/SOB, no N/V/D, no abd pain. Pt was evaluated in the ED 2 weeks ago x2 for similar symptoms and was dx with "a cold." Pt states she felt improved up until several hours ago when her symptoms returned.     Past Medical History  Diagnosis Date  . Preterm labor     c/s at 36 wks  . Placenta previa     hx and with current pregnancy  . Migraines     last one over 1 month ago  . Anxiety     no meds  . Depression     no meds  . Asthma   . ADHD (attention deficit hyperactivity disorder)   . Scoliosis    Past Surgical History  Procedure Laterality Date  . Cesarean section  2010    x 1 at 36 wks in [place]  . Svd  2003    x 1 in [place]  . Wisdom tooth extraction    . Tubal ligation     Family History  Problem Relation Age of Onset  . Cancer Mother   . Hypertension Sister   . Sickle cell trait Daughter   . Asthma Son   . Asthma Paternal Grandmother   . Pseudochol deficiency Neg Hx   . Malignant hyperthermia Neg Hx   . Anesthesia problems Neg Hx   . Hypotension Neg Hx    History  Substance Use Topics  . Smoking status: Current Every Day Smoker -- 0.20 packs/day for 12 years    Types: Cigarettes  . Smokeless tobacco: Never Used  . Alcohol Use: Yes     Comment: on occ   OB History    Gravida Para Term Preterm AB TAB SAB Ectopic Multiple Living   3 3 1 2  0 0 0 0 0 3     Review of Systems ROS: Statement: All systems negative except as marked or noted in the HPI; Constitutional: Negative for fever and chills.  +generalized body aches/fatigue.; ; Eyes: Negative for eye pain, redness and discharge. ; ; ENMT: Negative for hoarseness, +ear pain, nasal congestion, sinus pressure and sore throat. ; ; Cardiovascular: Negative for chest pain, palpitations, diaphoresis, dyspnea and peripheral edema. ; ; Respiratory: +cough. Negative for wheezing and stridor. ; ; Gastrointestinal: Negative for nausea, vomiting, diarrhea, abdominal pain, blood in stool, hematemesis, jaundice and rectal bleeding. . ; ; Genitourinary: Negative for dysuria, flank pain and hematuria. ; ; Musculoskeletal: Negative for back pain and neck pain. Negative for swelling and trauma.; ; Skin: Negative for pruritus, rash, abrasions, blisters, bruising and skin lesion.; ; Neuro: Negative for headache, lightheadedness and neck stiffness. Negative for weakness, altered level of consciousness , altered mental status, extremity weakness, paresthesias, involuntary movement, seizure and syncope.      Allergies  Review of patient's allergies indicates no known allergies.  Home Medications   Prior to Admission medications   Medication Sig Start Date End Date Taking? Authorizing Provider  benzonatate (TESSALON PERLES) 100 MG capsule Take 1  capsule (100 mg total) by mouth 3 (three) times daily as needed for cough. 09/15/14   Virgel Manifold, MD  dexamethasone (DECADRON) 4 MG tablet Take 1 tablet (4 mg total) by mouth 2 (two) times daily. 09/15/14   Virgel Manifold, MD  guaiFENesin-codeine 100-10 MG/5ML syrup Take 5 mLs by mouth 3 (three) times daily as needed for cough. 09/19/14   Heather Laisure, PA-C  ibuprofen (ADVIL,MOTRIN) 200 MG tablet Take 800 mg by mouth every 6 (six) hours as needed for moderate pain.    Historical Provider, MD  levonorgestrel (MIRENA) 20 MCG/24HR IUD 1 each by Intrauterine route continuous.     Historical Provider, MD  oxycodone-acetaminophen (PERCOCET) 2.5-325 MG per tablet Take 1-2 tablets by mouth every 6 (six) hours as needed for  pain. 08/05/14   Gavin Pound, CNM   BP 135/95 mmHg  Pulse 109  Temp(Src) 98.3 F (36.8 C) (Oral)  Resp 18  SpO2 98%  LMP 09/05/2014 (Approximate) Physical Exam  1220: Physical examination:  Nursing notes reviewed; Vital signs and O2 SAT reviewed;  Constitutional: Well developed, Well nourished, Well hydrated, In no acute distress; Head:  Normocephalic, atraumatic. No rash.; Eyes: EOMI, PERRL, No scleral icterus; ENMT: Mouth and pharynx normal, Mucous membranes moist. +clear fluid behind TM's bilat.TM's without erythema or bulging. No debris or swelling in canals. +edemetous nasal turbinates bilat with clear rhinorrhea. Mastoids NT bilat. Mouth and pharynx without lesions. No tonsillar exudates. No intra-oral edema. No submandibular or sublingual edema. No hoarse voice, no drooling, no stridor. No pain with manipulation of larynx. No trismus.; Neck: Supple, Full range of motion, No lymphadenopathy; Cardiovascular: Regular rate and rhythm, No murmur, rub, or gallop; Respiratory: Breath sounds clear & equal bilaterally, No rales, rhonchi, wheezes.  Speaking full sentences with ease, Normal respiratory effort/excursion; Chest: Nontender, Movement normal; Abdomen: Soft, Nontender, Nondistended, Normal bowel sounds; Genitourinary: No CVA tenderness; Spine:  No midline CS, TS, LS tenderness.;; Extremities: Pulses normal, No tenderness, No edema, No calf edema or asymmetry.; Neuro: AA&Ox3, Major CN grossly intact.  Speech clear. No gross focal motor or sensory deficits in extremities. Climbs on and off stretcher easily by herself. Gait steady.; Skin: Color normal, Warm, Dry.   ED Course  Procedures     EKG Interpretation None      MDM  MDM Reviewed: previous chart, nursing note and vitals Interpretation: x-ray and labs     Results for orders placed or performed during the hospital encounter of 10/06/14  Rapid strep screen  Result Value Ref Range   Streptococcus, Group A Screen (Direct)  NEGATIVE NEGATIVE  Pregnancy, urine  Result Value Ref Range   Preg Test, Ur NEGATIVE NEGATIVE  Urinalysis, Routine w reflex microscopic  Result Value Ref Range   Color, Urine AMBER (A) YELLOW   APPearance HAZY (A) CLEAR   Specific Gravity, Urine 1.026 1.005 - 1.030   pH 5.0 5.0 - 8.0   Glucose, UA NEGATIVE NEGATIVE mg/dL   Hgb urine dipstick LARGE (A) NEGATIVE   Bilirubin Urine NEGATIVE NEGATIVE   Ketones, ur NEGATIVE NEGATIVE mg/dL   Protein, ur NEGATIVE NEGATIVE mg/dL   Urobilinogen, UA 0.2 0.0 - 1.0 mg/dL   Nitrite NEGATIVE NEGATIVE   Leukocytes, UA TRACE (A) NEGATIVE  Urine microscopic-add on  Result Value Ref Range   Squamous Epithelial / LPF FEW (A) RARE   WBC, UA 0-2 <3 WBC/hpf   RBC / HPF 3-6 <3 RBC/hpf   Bacteria, UA FEW (A) RARE   Urine-Other MUCOUS PRESENT  Dg Chest 2 View 10/06/2014   CLINICAL DATA:  Back pain and cough  EXAM: CHEST  2 VIEW  COMPARISON:  None.  FINDINGS: The heart size and mediastinal contours are within normal limits. Both lungs are clear. The visualized skeletal structures show stable scoliosis of the mid thoracic spine  IMPRESSION: No active cardiopulmonary disease.   Electronically Signed   By: Inez Catalina M.D.   On: 10/06/2014 15:24     1525:  Workup reassuring. Tx symptomatically at this time. Dx and testing d/w pt.  Questions answered.  Verb understanding, agreeable to d/c home with outpt f/u.   Francine Graven, DO 10/09/14 1356

## 2014-10-06 NOTE — ED Notes (Signed)
CSW provided pt with taxi voucher transportation home. 

## 2014-10-06 NOTE — ED Notes (Signed)
Pt asked for a cab voucher... Social work contacted. Pt three kids and patient brought sprite. Pt family already brought graham crackers and water.

## 2014-10-06 NOTE — ED Notes (Signed)
Pt given cab voucher. Pt still unpleased with service.

## 2014-10-06 NOTE — ED Notes (Signed)
Pt complaining of severe pain around left ear. Tender upon palpation. Pt very tearful at this time, does not believe this is "just a cold." Pt states ear has been sore for 2 weeks. Pt took ibuprofen last night. No fever at this time. Pt states it hurts to breathe in.

## 2014-10-06 NOTE — ED Notes (Signed)
Pt yelling at me because she is unhappy with the MD diagnosis.

## 2014-10-06 NOTE — ED Notes (Signed)
Pt presents with nasal congestion, headache, body aches, and cough that started around 5am.  Denies medications prior to arrival, respirations e/u.

## 2014-10-06 NOTE — ED Notes (Signed)
Pt remains monitored by blood pressure and pulse ox. pts family at bedside.

## 2014-10-06 NOTE — Discharge Instructions (Signed)
°Emergency Department Resource Guide °1) Find a Doctor and Pay Out of Pocket °Although you won't have to find out who is covered by your insurance plan, it is a good idea to ask around and get recommendations. You will then need to call the office and see if the doctor you have chosen will accept you as a new patient and what types of options they offer for patients who are self-pay. Some doctors offer discounts or will set up payment plans for their patients who do not have insurance, but you will need to ask so you aren't surprised when you get to your appointment. ° °2) Contact Your Local Health Department °Not all health departments have doctors that can see patients for sick visits, but many do, so it is worth a call to see if yours does. If you don't know where your local health department is, you can check in your phone book. The CDC also has a tool to help you locate your state's health department, and many state websites also have listings of all of their local health departments. ° °3) Find a Walk-in Clinic °If your illness is not likely to be very severe or complicated, you may want to try a walk in clinic. These are popping up all over the country in pharmacies, drugstores, and shopping centers. They're usually staffed by nurse practitioners or physician assistants that have been trained to treat common illnesses and complaints. They're usually fairly quick and inexpensive. However, if you have serious medical issues or chronic medical problems, these are probably not your best option. ° °No Primary Care Doctor: °- Call [facility] at  832-8000 - they can help you locate a primary care doctor that  accepts your insurance, provides certain services, etc. °- Physician Referral Service- 1-800-533-3463 ° °Chronic Pain Problems: °Organization         Address  Phone   Notes  °[facility]  (336) 297-2271 Patients need to be referred by their primary care doctor.  ° °Medication  Assistance: °Organization         Address  Phone   Notes  °[place] [facility] [address] °[place], [place] [address] (336) 641-8030 --Must be a resident of [place] °-- Must have NO insurance coverage whatsoever (no Medicaid/ Medicare, etc.) °-- The pt. MUST have a primary care doctor that directs their care regularly and follows them in the community °  °[facility]  (866) 331-1348   °[employer]  (888) 892-1162   ° °Agencies that provide inexpensive medical care: °Organization         Address  Phone   Notes  °[facility]  (336) 832-8035   °[facility]    (336) 832-7272   °[facility] [address] °[place], [place] [address] (336) 832-4777   °[facility] [address], °[place] (336) 271-4999   °[facility]    (336) 373-0678   °[facility]    (336) 272-1050   °[facility] ° [address], [place] Phone:  (336) 832-4444, Fax:  (336) 832-4440 Hours of Operation:  9 am - 6 pm, M-F.  Also accepts Medicaid/Medicare and self-pay.  °[facility] ° [address], [place] Phone: (336) 832-3150, Fax: (336) [phone]. Hours of Operation:  8:30 am - 5:30 pm, M-F.  Also accepts Medicaid and self-pay.  °[facility] [place] [address], [place] Phone: (336) 878-6027   °[facility] [address], [place], [place] (336)723-1848, Ext. 123 Mondays & Thursdays: 7-9 AM.  First 15 patients are seen on a first come, first serve basis. °  ° °Medicaid-accepting [place] Providers: ° °Organization         Address  Phone   Notes  °[facility] [address], [place] (336) 641-2100 Also accepts self-pay patients.  °[facility] [address], [place] ° (336) 856-9996   °[facility] [address], [place]  (336) 288-8857   °[facility] [address] [place] [address], [place] (336) 299-7000   °Veita Bland [address], [place]  ° (336) 373-1557 Only accepts [place] Access Medicaid patients after they have their name applied to their card.  ° °Self-Pay (no insurance) in [place]: ° °Organization         Address  Phone   Notes  °Sickle Cell Patients, [facility] [address], [place] (336) 832-1970   °[facility] [address], [place] (336) 832-4400   °[facility] [facility] ° [address], [place] (336) 992-4800   °[facility]/Dr. Osei-Bonsu ° [address] [place] [address], [place] or [address], [place] (336) 841-8500 Phone number for both [place] and [place] locations is the same.  °[facility] [address], [place] (336) 299-0000   °[facility] [address] [place] [address], [place] or [address] (336) 852-7530 °(336) 878-2260   °[facility] [address], [place] (336) 350-1642, phone; (336) 294-5005, fax Sees patients 1st and 3rd Saturday of every month.  Must not qualify for public or private insurance (i.e. Medicaid, Medicare, [place] Health Choice, Veterans' Benefits) • Household income should be no more than 200% of the poverty level •The clinic cannot treat you if you are pregnant or think you are pregnant • Sexually transmitted diseases are not treated at the clinic.  ° ° °Dental Care: °Organization         Address  Phone  Notes  °[place] [facility] [address], [place] (336) 641-6152 Accepts children up to age 21 who are enrolled in Medicaid or [place] Health Choice; pregnant women with a Medicaid card; and children who have applied for Medicaid or [place] Health Choice, but were declined, whose parents can pay a reduced fee at time of service.  °[place]  [facility] [place]  [address], [place] (336) 641-7733 Accepts children up to age 21 who are enrolled in Medicaid or [place] Health Choice; pregnant women with a Medicaid card; and children who have applied for Medicaid or  Health Choice, but were declined, whose parents can pay a reduced fee at time of service.  °[facility] ° [address], [place] (336) 641-4533 Patients are seen by appointment only. Walk-ins are not accepted. [facility] will see patients 18 years of age and older. °Monday - Tuesday (8am-5pm) °Most Wednesdays (8:30-5pm) °$30 per visit, cash only  °[facility] ° [address], [place] (336) 641-4533 Patients are seen by appointment only. Walk-ins are not accepted. [facility] will see patients 18 years of age and older. °One   Wednesday Evening (Monthly: Volunteer Based).  $30 per visit, cash only  °[facility]  (919) 537-3737 for adults; Children under age 4, call Graduate Pediatric Dentistry at ([phone]) [phone]. Children aged 4-14, please call ([phone]) [phone] to request a pediatric application. ° Dental services are provided in all areas of dental care including fillings, crowns and bridges, complete and partial dentures, implants, gum treatment, root canals, and extractions. Preventive care is also provided. Treatment is provided to both adults and children. °Patients are selected via a lottery and there is often a waiting list. °  °[facility] [address], °[place] ° (336) 763-8833 [email or web address] °  °[facility] [address], [place], [place] (336)723-1848, Ext. 123 Second and Fourth Thursday of each month, opens at 6:30 AM; Clinic ends at 9 AM.  Patients are seen on a first-come first-served basis, and a limited number are seen during each clinic.  ° °[facility] ° [address], [place], [place] (336) 723-7904    Eligibility Requirements °You must have lived in [place], [place], or [place] counties for at least the last three months. °  You cannot be eligible for state or federal sponsored healthcare insurance, including Veterans Administration, Medicaid, or Medicare. °  You generally cannot be eligible for healthcare insurance through your employer.  °  How to apply: °Eligibility screenings are held every Tuesday and Wednesday afternoon from 1:00 pm until 4:00 pm. You do not need an appointment for the interview!  °[facility] [address], [place], [place] 336-631-2330   °[facility]  336-342-8273   °[facility]  336-703-3100   °[facility]  336-570-6415   ° °Behavioral Health Resources in the Community: °Intensive Outpatient Programs °Organization         Address  Phone  Notes  °[facility] [address], [place], [place] 336-878-6098   °[facility] [address], [place], [place] 336-832-9800   °[facility] [address], [place], [place] ° 336-882-2125   °[facility] [address],  °[place], [place] 1-800-853-5163 or 336-641-4981   °Substance Abuse Resources °Organization         Address  Phone  Notes  °[facility]  336-882-2125   °[facility]  336-784-9470   °[facility]  336-285-9073   °[facility]  336-845-3988   °Residential & Outpatient Substance Abuse Program  1-800-659-3381   °Psychological Services °Organization         Address  Phone  Notes  °[facility]  336- 832-9600   °[facility]  336- 378-7881   °[facility] [address], [place] 1-800-853-5163 or 336-641-4981   ° °Mobile Crisis Teams °Organization         Address  Phone  Notes  °[facility], Mobile Crisis Care Unit  1-877-626-1772   °Assertive °[facility] ° [address]  [place], [place] 336-834-9664   °Sharon DeEsch [address] °[place] 336-554-5454   ° °Self-Help/Support Groups °Organization         Address  Phone             Notes  °[facility] - variety of support groups  336- 373-1402 Call for more information  °Narcotics Anonymous (NA), [facility] [address], °[place]   2 meetings at this location  ° °  Residential Treatment Programs Organization         Address  Phone  Notes  [facility] [address]    [place]  1-(418) 400-0162   [facility]  [address] [place] [address], [place], [place]   [place], [place] (854) 264-3574 Admissions: 8am-3pm M-F  [facility] [place] [address]    [place], [place] 297-989-2119   [facility] [address] [place], [place], [place]   [facility] [address],  [place], [place]   [facility] Intensive Outpatient [address], [place], [place]   [facility] ([place] [address]  [place], [place] 1-909-107-3045 or [phone]   [facility] [address], [place], [place] Accepts Medicaid  [facility] [address]  [place] 1-347-399-6906 Substance Abuse/Addiction Treatment   [facility]         Address  Phone  Notes  [facility]  534-588-3806   Domenic Schwab, PhD [address] [place], [place]   831 382 7365 or (469)536-2987   [facility] [place], [place] (848) 311-5577   [facility] [address] [place], [place] (978)231-0549 Insurance/Medicaid/sponsorship through [facility] and Families [address]                                    [place], [place] (202)[phone] [place] [address], [place] [phone]    Dr. Adele Schilder  ([phone]) [phone]   [facility] Dept. 1) [address], [place] 2) [place] 3)  [address], [place] (310)[phone] 2262669465  [phone]   [place] 775-589-3524 or 737-376-9386 (After Hours)      Take over the counter decongestant (such as sudafed), as directed on packaging, for the next week.  Use over the counter normal saline nasal spray, as instructed in the Emergency Department, several times per day for the next 2 weeks.  May also take over the counter ibuprofen, as directed on packaging, as needed for discomfort. Call your regular medical doctor today to schedule a follow up appointment this week.  Return to the Emergency Department immediately if worsening.

## 2014-10-07 ENCOUNTER — Emergency Department (INDEPENDENT_AMBULATORY_CARE_PROVIDER_SITE_OTHER)
Admission: EM | Admit: 2014-10-07 | Discharge: 2014-10-07 | Disposition: A | Discharge status: Home / Self Care | Payer: Medicaid Other | Source: Home / Self Care | Attending: Family Medicine | Admitting: Family Medicine

## 2014-10-07 ENCOUNTER — Encounter (HOSPITAL_COMMUNITY): Payer: Self-pay | Admitting: Emergency Medicine

## 2014-10-07 DIAGNOSIS — G500 Trigeminal neuralgia: Secondary | ICD-10-CM

## 2014-10-07 MED ORDER — GABAPENTIN 300 MG PO CAPS: 300.0000 mg | ORAL_CAPSULE | Freq: Three times a day (TID) | ORAL | Status: DC

## 2014-10-07 MED ORDER — AMOXICILLIN-POT CLAVULANATE 875-125 MG PO TABS: 1.0000 | ORAL_TABLET | Freq: Two times a day (BID) | ORAL | Status: DC

## 2014-10-07 NOTE — ED Provider Notes (Signed)
CSN: [id number]     Arrival date & time 10/07/14  1017 History   First MD Initiated Contact with Patient 10/07/14 1116     Chief Complaint  Patient presents with  . Facial Swelling   (Consider location/radiation/quality/duration/timing/severity/associated sxs/prior Treatment) Patient is a 33 y.o. female presenting with neck injury. The history is provided by the patient.  Neck Injury This is a new problem. The current episode started more than 1 week ago (2 wks of right sided pain and swelling.). The problem has been gradually worsening. Pertinent negatives include no chest pain and no abdominal pain.    Past Medical History  Diagnosis Date  . Preterm labor     c/s at 36 wks  . Placenta previa     hx and with current pregnancy  . Migraines     last one over 1 month ago  . Anxiety     no meds  . Depression     no meds  . Asthma   . ADHD (attention deficit hyperactivity disorder)   . Scoliosis    Past Surgical History  Procedure Laterality Date  . Cesarean section  2010    x 1 at 36 wks in [place]  . Svd  2003    x 1 in [place]  . Wisdom tooth extraction    . Tubal ligation     Family History  Problem Relation Age of Onset  . Cancer Mother   . Hypertension Sister   . Sickle cell trait Daughter   . Asthma Son   . Asthma Paternal Grandmother   . Pseudochol deficiency Neg Hx   . Malignant hyperthermia Neg Hx   . Anesthesia problems Neg Hx   . Hypotension Neg Hx    History  Substance Use Topics  . Smoking status: Current Every Day Smoker -- 0.20 packs/day for 12 years    Types: Cigarettes  . Smokeless tobacco: Never Used  . Alcohol Use: Yes     Comment: on occ   OB History    Gravida Para Term Preterm AB TAB SAB Ectopic Multiple Living   3 3 1 2  0 0 0 0 0 3     Review of Systems  Constitutional: Negative.   HENT: Positive for congestion, postnasal drip and rhinorrhea. Negative for facial swelling.   Cardiovascular: Negative for chest pain.   Gastrointestinal: Negative for abdominal pain.  Musculoskeletal: Positive for neck pain.    Allergies  Review of patient's allergies indicates no known allergies.  Home Medications   Prior to Admission medications   Medication Sig Start Date End Date Taking? Authorizing Provider  ibuprofen (ADVIL,MOTRIN) 200 MG tablet Take 800 mg by mouth 3 (three) times daily as needed for moderate pain.    Yes Historical Provider, MD  oxyCODONE-acetaminophen (PERCOCET/ROXICET) 5-325 MG per tablet 1 or 2 tabs PO q6h prn pain 10/06/14  Yes Francine Graven, DO  amoxicillin-clavulanate (AUGMENTIN) 875-125 MG per tablet Take 1 tablet by mouth 2 (two) times daily. 10/07/14   Billy Fischer, MD  benzonatate (TESSALON) 100 MG capsule Take 1 capsule (100 mg total) by mouth 3 (three) times daily as needed for cough. 10/06/14   Francine Graven, DO  dexamethasone (DECADRON) 4 MG tablet Take 1 tablet (4 mg total) by mouth 2 (two) times daily. 09/15/14   Virgel Manifold, MD  gabapentin (NEURONTIN) 300 MG capsule Take 1 capsule (300 mg total) by mouth 3 (three) times daily. 10/07/14   Billy Fischer, MD  guaiFENesin-codeine 100-10 MG/5ML  syrup Take 5 mLs by mouth 3 (three) times daily as needed for cough. 09/19/14   Hyman Bible, PA-C  levonorgestrel (MIRENA) 20 MCG/24HR IUD 1 each by Intrauterine route continuous.     Historical Provider, MD  oxymetazoline (AFRIN) 0.05 % nasal spray Place 1 spray into both nostrils at bedtime as needed for congestion.    Historical Provider, MD   BP 128/89 mmHg  Pulse 103  Temp(Src) 98 F (36.7 C) (Oral)  Resp 16  SpO2 100%  LMP 10/06/2014 Physical Exam  Constitutional: She is oriented to person, place, and time. She appears well-developed and well-nourished. No distress.  HENT:  Head: Normocephalic.  Right Ear: External ear normal.  Left Ear: External ear normal.  Nose: Nose normal.  Mouth/Throat: Oropharynx is clear and moist.  Eyes: Conjunctivae are normal.  Neck: Normal  range of motion. Neck supple.  Right periauricular tenderness extending to right neck  Pulmonary/Chest: Breath sounds normal.  Lymphadenopathy:    She has no cervical adenopathy.  Neurological: She is alert and oriented to person, place, and time.  Skin: Skin is warm and dry.  Nursing note and vitals reviewed.   ED Course  Procedures (including critical care time) Labs Review Labs Reviewed - No data to display  Imaging Review Dg Chest 2 View  10/06/2014   CLINICAL DATA:  Back pain and cough  EXAM: CHEST  2 VIEW  COMPARISON:  None.  FINDINGS: The heart size and mediastinal contours are within normal limits. Both lungs are clear. The visualized skeletal structures show stable scoliosis of the mid thoracic spine  IMPRESSION: No active cardiopulmonary disease.   Electronically Signed   By: Inez Catalina M.D.   On: 10/06/2014 15:24     MDM   1. Trigeminal neuralgia of right side of face        Billy Fischer, MD 10/07/14 1150

## 2014-10-07 NOTE — Discharge Instructions (Signed)
Use heat and medicine as prescribed, return if further problems.

## 2014-10-07 NOTE — ED Notes (Signed)
C/o right sided facial swelling x 2 weeks.  Pain felt with palpitation behind ear and down side of neck.  Denies injury.   Pt here for second opinion.  Was seen in ER 11/23.

## 2014-10-08 ENCOUNTER — Emergency Department (HOSPITAL_COMMUNITY)
Admission: EM | Admit: 2014-10-08 | Discharge: 2014-10-08 | Disposition: A | Discharge status: Home / Self Care | Payer: Medicaid Other | Attending: Emergency Medicine | Admitting: Emergency Medicine

## 2014-10-08 ENCOUNTER — Encounter (HOSPITAL_COMMUNITY): Payer: Self-pay | Admitting: Emergency Medicine

## 2014-10-08 DIAGNOSIS — F418 Other specified anxiety disorders: Secondary | ICD-10-CM | POA: Diagnosis not present

## 2014-10-08 DIAGNOSIS — J45909 Unspecified asthma, uncomplicated: Secondary | ICD-10-CM | POA: Insufficient documentation

## 2014-10-08 DIAGNOSIS — Z79899 Other long term (current) drug therapy: Secondary | ICD-10-CM | POA: Insufficient documentation

## 2014-10-08 DIAGNOSIS — Z7952 Long term (current) use of systemic steroids: Secondary | ICD-10-CM | POA: Insufficient documentation

## 2014-10-08 DIAGNOSIS — R112 Nausea with vomiting, unspecified: Secondary | ICD-10-CM | POA: Diagnosis not present

## 2014-10-08 DIAGNOSIS — Z8739 Personal history of other diseases of the musculoskeletal system and connective tissue: Secondary | ICD-10-CM | POA: Diagnosis not present

## 2014-10-08 DIAGNOSIS — Z8659 Personal history of other mental and behavioral disorders: Secondary | ICD-10-CM | POA: Diagnosis not present

## 2014-10-08 DIAGNOSIS — Z720 Tobacco use: Secondary | ICD-10-CM | POA: Diagnosis not present

## 2014-10-08 LAB — CBC WITH DIFFERENTIAL/PLATELET
Basophils Absolute: 0 10*3/uL (ref 0.0–0.1)
Basophils Relative: 0 % (ref 0–1)
Eosinophils Absolute: 0.3 10*3/uL (ref 0.0–0.7)
Eosinophils Relative: 2 % (ref 0–5)
HCT: 42.7 % (ref 36.0–46.0)
Hemoglobin: 14.9 g/dL (ref 12.0–15.0)
Lymphocytes Relative: 12 % (ref 12–46)
Lymphs Abs: 1.5 10*3/uL (ref 0.7–4.0)
MCH: 29.9 pg (ref 26.0–34.0)
MCHC: 34.9 g/dL (ref 30.0–36.0)
MCV: 85.7 fL (ref 78.0–100.0)
Monocytes Absolute: 0.4 10*3/uL (ref 0.1–1.0)
Monocytes Relative: 3 % (ref 3–12)
Neutro Abs: 10.4 10*3/uL — ABNORMAL HIGH (ref 1.7–7.7)
Neutrophils Relative %: 83 % — ABNORMAL HIGH (ref 43–77)
Platelets: 332 10*3/uL (ref 150–400)
RBC: 4.98 MIL/uL (ref 3.87–5.11)
RDW: 13.7 % (ref 11.5–15.5)
WBC: 12.6 10*3/uL — ABNORMAL HIGH (ref 4.0–10.5)

## 2014-10-08 LAB — COMPREHENSIVE METABOLIC PANEL
ALT: 23 U/L (ref 0–35)
AST: 20 U/L (ref 0–37)
Albumin: 4 g/dL (ref 3.5–5.2)
Alkaline Phosphatase: 81 U/L (ref 39–117)
Anion gap: 13 (ref 5–15)
BUN: 7 mg/dL (ref 6–23)
CO2: 23 mEq/L (ref 19–32)
Calcium: 9.2 mg/dL (ref 8.4–10.5)
Chloride: 103 mEq/L (ref 96–112)
Creatinine, Ser: 0.61 mg/dL (ref 0.50–1.10)
GFR calc Af Amer: >90 mL/min (ref >90)
GFR calc non Af Amer: >90 mL/min (ref >90)
Glucose, Bld: 127 mg/dL — ABNORMAL HIGH (ref 70–99)
Potassium: 3.6 mEq/L — ABNORMAL LOW (ref 3.7–5.3)
Sodium: 139 mEq/L (ref 137–147)
Total Bilirubin: 0.8 mg/dL (ref 0.3–1.2)
Total Protein: 7.9 g/dL (ref 6.0–8.3)

## 2014-10-08 LAB — CULTURE, GROUP A STREP

## 2014-10-08 MED ORDER — PROMETHAZINE HCL 25 MG PO TABS: 25.0000 mg | ORAL_TABLET | Freq: Four times a day (QID) | ORAL | Status: DC | PRN

## 2014-10-08 MED ORDER — MORPHINE SULFATE 4 MG/ML IJ SOLN
4.0000 mg | Freq: Once | INTRAMUSCULAR | Status: AC
  Administered 2014-10-08: 4 mg via INTRAMUSCULAR
  Filled 2014-10-08: qty 1

## 2014-10-08 MED ORDER — PROMETHAZINE HCL 12.5 MG PO TABS
12.5000 mg | ORAL_TABLET | Freq: Once | ORAL | Status: AC
  Administered 2014-10-08: 12.5 mg via ORAL
  Filled 2014-10-08: qty 1

## 2014-10-08 MED ORDER — ONDANSETRON 4 MG PO TBDP: 4.0000 mg | ORAL_TABLET | Freq: Three times a day (TID) | ORAL | Status: DC | PRN

## 2014-10-08 MED ORDER — PROMETHAZINE HCL 25 MG/ML IJ SOLN
25.0000 mg | Freq: Once | INTRAMUSCULAR | Status: AC
  Administered 2014-10-08: 25 mg via INTRAMUSCULAR
  Filled 2014-10-08: qty 1

## 2014-10-08 MED ORDER — ONDANSETRON 4 MG PO TBDP
4.0000 mg | ORAL_TABLET | Freq: Once | ORAL | Status: DC
  Filled 2014-10-08: qty 1

## 2014-10-08 NOTE — Discharge Instructions (Signed)
Take zofran or phenergan as needed for nausea and vomiting. Refer to attached documents for more information. Follow up with your doctor for further evaluation.

## 2014-10-08 NOTE — ED Provider Notes (Signed)
CSN: [id number]     Arrival date & time 10/08/14  0035 History   First MD Initiated Contact with Patient 10/08/14 [phone]     Chief Complaint  Patient presents with  . Emesis     (Consider location/radiation/quality/duration/timing/severity/associated sxs/prior Treatment) Patient is a 33 y.o. female presenting with vomiting. The history is provided by the patient. No language interpreter was used.  Emesis Severity:  Moderate Duration:  1 day Timing:  Intermittent Number of daily episodes:  30 Quality:  Stomach contents Feeding tolerance: nothing. Onset of vomiting after eating: unknown. Progression:  Unchanged Chronicity:  New Recent urination:  Normal Context: not post-tussive and not self-induced   Relieved by:  Nothing Worsened by:  Nothing tried Ineffective treatments:  None tried Associated symptoms: no abdominal pain, no arthralgias, no chills and no diarrhea   Risk factors: no alcohol use, no diabetes, not pregnant now, no prior abdominal surgery, no sick contacts, no suspect food intake and no travel to endemic areas     Past Medical History  Diagnosis Date  . Preterm labor     c/s at 36 wks  . Placenta previa     hx and with current pregnancy  . Migraines     last one over 1 month ago  . Anxiety     no meds  . Depression     no meds  . Asthma   . ADHD (attention deficit hyperactivity disorder)   . Scoliosis    Past Surgical History  Procedure Laterality Date  . Cesarean section  2010    x 1 at 36 wks in [place]  . Svd  2003    x 1 in [place]  . Wisdom tooth extraction    . Tubal ligation     Family History  Problem Relation Age of Onset  . Cancer Mother   . Hypertension Sister   . Sickle cell trait Daughter   . Asthma Son   . Asthma Paternal Grandmother   . Pseudochol deficiency Neg Hx   . Malignant hyperthermia Neg Hx   . Anesthesia problems Neg Hx   . Hypotension Neg Hx    History  Substance Use Topics  . Smoking status: Current Every Day  Smoker -- 0.20 packs/day for 12 years    Types: Cigarettes  . Smokeless tobacco: Never Used  . Alcohol Use: Yes     Comment: on occ   OB History    Gravida Para Term Preterm AB TAB SAB Ectopic Multiple Living   3 3 1 2  0 0 0 0 0 3     Review of Systems  Constitutional: Negative for fever, chills and fatigue.  HENT: Negative for trouble swallowing.   Eyes: Negative for visual disturbance.  Respiratory: Negative for shortness of breath.   Cardiovascular: Negative for chest pain and palpitations.  Gastrointestinal: Positive for nausea and vomiting. Negative for abdominal pain and diarrhea.  Genitourinary: Negative for dysuria and difficulty urinating.  Musculoskeletal: Negative for arthralgias and neck pain.  Skin: Negative for color change.  Neurological: Negative for dizziness and weakness.  Psychiatric/Behavioral: Negative for dysphoric mood.      Allergies  Review of patient's allergies indicates no known allergies.  Home Medications   Prior to Admission medications   Medication Sig Start Date End Date Taking? Authorizing Provider  amoxicillin-clavulanate (AUGMENTIN) 875-125 MG per tablet Take 1 tablet by mouth 2 (two) times daily. 10/07/14  Yes Billy Fischer, MD  benzonatate (TESSALON) 100 MG capsule Take 1 capsule (  100 mg total) by mouth 3 (three) times daily as needed for cough. 10/06/14  Yes Francine Graven, DO  gabapentin (NEURONTIN) 300 MG capsule Take 1 capsule (300 mg total) by mouth 3 (three) times daily. 10/07/14  Yes Billy Fischer, MD  guaiFENesin-codeine 100-10 MG/5ML syrup Take 5 mLs by mouth 3 (three) times daily as needed for cough. 09/19/14  Yes Heather Laisure, PA-C  ibuprofen (ADVIL,MOTRIN) 200 MG tablet Take 800 mg by mouth 3 (three) times daily as needed for moderate pain.    Yes Historical Provider, MD  levonorgestrel (MIRENA) 20 MCG/24HR IUD 1 each by Intrauterine route continuous.    Yes Historical Provider, MD  oxyCODONE-acetaminophen  (PERCOCET/ROXICET) 5-325 MG per tablet 1 or 2 tabs PO q6h prn pain Patient taking differently: Take 1 tablet by mouth every 6 (six) hours as needed for moderate pain. 1 or 2 tabs PO q6h prn pain 10/06/14  Yes Francine Graven, DO  dexamethasone (DECADRON) 4 MG tablet Take 1 tablet (4 mg total) by mouth 2 (two) times daily. Patient not taking: Reported on 10/08/2014 09/15/14   Virgel Manifold, MD  oxymetazoline (AFRIN) 0.05 % nasal spray Place 1 spray into both nostrils at bedtime as needed for congestion.    Historical Provider, MD   BP 107/64 mmHg  Pulse 91  Temp(Src) 97.1 F (36.2 C) (Oral)  Resp 18  Ht 5\' 4"  (1.626 m)  Wt 178 lb (80.74 kg)  BMI 30.54 kg/m2  SpO2 92%  LMP 10/06/2014 Physical Exam  Constitutional: She is oriented to person, place, and time. She appears well-developed and well-nourished. No distress.  HENT:  Head: Normocephalic and atraumatic.  Eyes: Conjunctivae and EOM are normal.  Neck: Normal range of motion.  Cardiovascular: Normal rate and regular rhythm.  Exam reveals no gallop and no friction rub.   No murmur heard. Pulmonary/Chest: Effort normal and breath sounds normal. She has no wheezes. She has no rales. She exhibits no tenderness.  Abdominal: Soft. She exhibits no distension. There is no tenderness. There is no rebound.  Musculoskeletal: Normal range of motion.  Neurological: She is alert and oriented to person, place, and time. Coordination normal.  Speech is goal-oriented. Moves limbs without ataxia.   Skin: Skin is warm and dry.  Psychiatric: She has a normal mood and affect. Her behavior is normal.  Nursing note and vitals reviewed.   ED Course  Procedures (including critical care time) Labs Review Labs Reviewed  CBC WITH DIFFERENTIAL - Abnormal; Notable for the following:    WBC 12.6 (*)    Neutrophils Relative % 83 (*)    Neutro Abs 10.4 (*)    All other components within normal limits  COMPREHENSIVE METABOLIC PANEL - Abnormal; Notable for  the following:    Potassium 3.6 (*)    Glucose, Bld 127 (*)    All other components within normal limits    Imaging Review Dg Chest 2 View  10/06/2014   CLINICAL DATA:  Back pain and cough  EXAM: CHEST  2 VIEW  COMPARISON:  None.  FINDINGS: The heart size and mediastinal contours are within normal limits. Both lungs are clear. The visualized skeletal structures show stable scoliosis of the mid thoracic spine  IMPRESSION: No active cardiopulmonary disease.   Electronically Signed   By: Inez Catalina M.D.   On: 10/06/2014 15:24     EKG Interpretation None      MDM   Final diagnoses:  Non-intractable vomiting with nausea, vomiting of unspecified type  7:25 AM Labs show mild elevation of WBC likely due to nausea and vomiting. Remaining labs stable. Patient's vitals stable and patient afebrile. Patient likely has viral nausea and vomiting. Patient will have IM phenergan and morphine here. Patient will be discharged with zofran. Patient has not vomiting here. No further evaluation needed here.   Alvina Chou, PA-C 10/08/14 0093  April K Palumbo-Rasch, MD 10/08/14 [phone]

## 2014-10-08 NOTE — ED Notes (Signed)
Pt. reports persistent nausea/vomitting onset yesterday , denies fever or chills , seen at [facility] urgent care yesterday diagnosed with trigeminal neuralgia discharged home with prescription Augmentin and Neurontin.

## 2014-11-24 ENCOUNTER — Emergency Department (HOSPITAL_COMMUNITY)
Admission: EM | Admit: 2014-11-24 | Discharge: 2014-11-24 | Disposition: A | Discharge status: Home / Self Care | Payer: Medicaid Other | Attending: Emergency Medicine | Admitting: Emergency Medicine

## 2014-11-24 ENCOUNTER — Emergency Department (HOSPITAL_COMMUNITY): Payer: Medicaid Other

## 2014-11-24 DIAGNOSIS — Z792 Long term (current) use of antibiotics: Secondary | ICD-10-CM | POA: Insufficient documentation

## 2014-11-24 DIAGNOSIS — M419 Scoliosis, unspecified: Secondary | ICD-10-CM | POA: Insufficient documentation

## 2014-11-24 DIAGNOSIS — Z7952 Long term (current) use of systemic steroids: Secondary | ICD-10-CM | POA: Diagnosis not present

## 2014-11-24 DIAGNOSIS — Z79899 Other long term (current) drug therapy: Secondary | ICD-10-CM | POA: Diagnosis not present

## 2014-11-24 DIAGNOSIS — G43909 Migraine, unspecified, not intractable, without status migrainosus: Secondary | ICD-10-CM | POA: Diagnosis not present

## 2014-11-24 DIAGNOSIS — F419 Anxiety disorder, unspecified: Secondary | ICD-10-CM | POA: Insufficient documentation

## 2014-11-24 DIAGNOSIS — Z720 Tobacco use: Secondary | ICD-10-CM | POA: Diagnosis not present

## 2014-11-24 DIAGNOSIS — J45901 Unspecified asthma with (acute) exacerbation: Secondary | ICD-10-CM | POA: Insufficient documentation

## 2014-11-24 DIAGNOSIS — R079 Chest pain, unspecified: Secondary | ICD-10-CM | POA: Diagnosis present

## 2014-11-24 LAB — COMPREHENSIVE METABOLIC PANEL
ALT: 16 U/L (ref 0–35)
AST: 21 U/L (ref 0–37)
Albumin: 3.7 g/dL (ref 3.5–5.2)
Alkaline Phosphatase: 67 U/L (ref 39–117)
Anion gap: 6 (ref 5–15)
BUN: 7 mg/dL (ref 6–23)
CO2: 25 mmol/L (ref 19–32)
Calcium: 9.1 mg/dL (ref 8.4–10.5)
Chloride: 107 mEq/L (ref 96–112)
Creatinine, Ser: 0.52 mg/dL (ref 0.50–1.10)
GFR calc Af Amer: >90 mL/min (ref >90)
GFR calc non Af Amer: >90 mL/min (ref >90)
Glucose, Bld: 88 mg/dL (ref 70–99)
Potassium: 3.4 mmol/L — ABNORMAL LOW (ref 3.5–5.1)
Sodium: 138 mmol/L (ref 135–145)
Total Bilirubin: 0.4 mg/dL (ref 0.3–1.2)
Total Protein: 7 g/dL (ref 6.0–8.3)

## 2014-11-24 LAB — CBC WITH DIFFERENTIAL/PLATELET
Basophils Absolute: 0 10*3/uL (ref 0.0–0.1)
Basophils Relative: 0 % (ref 0–1)
Eosinophils Absolute: 0.2 10*3/uL (ref 0.0–0.7)
Eosinophils Relative: 2 % (ref 0–5)
HCT: 40.5 % (ref 36.0–46.0)
Hemoglobin: 14.2 g/dL (ref 12.0–15.0)
Lymphocytes Relative: 31 % (ref 12–46)
Lymphs Abs: 3.2 10*3/uL (ref 0.7–4.0)
MCH: 29.4 pg (ref 26.0–34.0)
MCHC: 35.1 g/dL (ref 30.0–36.0)
MCV: 83.9 fL (ref 78.0–100.0)
Monocytes Absolute: 0.6 10*3/uL (ref 0.1–1.0)
Monocytes Relative: 6 % (ref 3–12)
Neutro Abs: 6.4 10*3/uL (ref 1.7–7.7)
Neutrophils Relative %: 61 % (ref 43–77)
Platelets: 321 10*3/uL (ref 150–400)
RBC: 4.83 MIL/uL (ref 3.87–5.11)
RDW: 13.3 % (ref 11.5–15.5)
WBC: 10.4 10*3/uL (ref 4.0–10.5)

## 2014-11-24 LAB — TROPONIN I: Troponin I: <0.03 ng/mL (ref <0.031)

## 2014-11-24 LAB — D-DIMER, QUANTITATIVE (NOT AT ARMC): D-Dimer, Quant: <0.27 ug/mL-FEU (ref 0.00–0.48)

## 2014-11-24 MED ORDER — KETOROLAC TROMETHAMINE 30 MG/ML IJ SOLN
30.0000 mg | Freq: Once | INTRAMUSCULAR | Status: DC
  Filled 2014-11-24: qty 1

## 2014-11-24 MED ORDER — IBUPROFEN 600 MG PO TABS: 600.0000 mg | ORAL_TABLET | Freq: Three times a day (TID) | ORAL | Status: DC | PRN

## 2014-11-24 MED ORDER — HYDROCODONE-ACETAMINOPHEN 5-325 MG PO TABS
1.0000 | ORAL_TABLET | Freq: Once | ORAL | Status: AC
  Administered 2014-11-24: 1 via ORAL
  Filled 2014-11-24: qty 1

## 2014-11-24 MED ORDER — GI COCKTAIL ~~LOC~~
30.0000 mL | Freq: Once | ORAL | Status: AC
  Administered 2014-11-24: 30 mL via ORAL
  Filled 2014-11-24: qty 30

## 2014-11-24 MED ORDER — ASPIRIN 325 MG PO TABS
325.0000 mg | ORAL_TABLET | Freq: Once | ORAL | Status: AC
  Administered 2014-11-24: 325 mg via ORAL
  Filled 2014-11-24: qty 1

## 2014-11-24 MED ORDER — OMEPRAZOLE 20 MG PO CPDR: 20.0000 mg | DELAYED_RELEASE_CAPSULE | Freq: Every day | ORAL | Status: DC

## 2014-11-24 NOTE — ED Notes (Signed)
Reported to Dr. Venora Maples that patient does not want to take toradol.  MD acknowledges and will allow tylenol or motrin for pain, no narcotics since her children are at the bedside. Explained to the patient. She declines tylenol and motrin at this time.

## 2014-11-24 NOTE — ED Notes (Signed)
Patient reports she is planning to wait for ride in the waiting room.

## 2014-11-24 NOTE — ED Notes (Signed)
Phlebotomy at the bedside  

## 2014-11-24 NOTE — ED Notes (Signed)
Per EMS, patient woke up 45 minutes ago, with pain increasing with palpation and respiration. VS with EMS: bp 128/90, p 70-80.

## 2014-11-24 NOTE — ED Notes (Signed)
Called main lab in regards to d-dimer result.

## 2014-11-24 NOTE — ED Notes (Signed)
Dr. Venora Maples at the bedside.

## 2014-11-24 NOTE — ED Provider Notes (Signed)
CSN: [id number]     Arrival date & time 11/24/14  0300 History   None    Chief Complaint  Patient presents with  . Chest Pain   Patient is a 34 y.o. female presenting with chest pain. The history is provided by the patient. No language interpreter was used.  Chest Pain Associated symptoms: shortness of breath     This chart was scribed for No att. providers found by Thea Alken, ED Scribe. This patient was seen in room D35C/D35C and the patient's care was started at 3:10 AM.  HPI Comments:  Penny [name] is a 34 y.o. female who present to the Emergency Department complaining of sudden,sharp CP with associated SOB that began about 1 hour ago. Pt states she was asleep when she was awoken by left shoulder pain that had radiated to her chest.  Her pain was sharp in nature. she continues to have pain at this time.  She denies medical problems including HTN and DM. Pt denies family hx of MI and heart disease.  No history DVT or pulmonary embolism.  She reports that her pain is worse with deep breathing and worse with palpation of her left chest.   Past Medical History  Diagnosis Date  . Preterm labor     c/s at 36 wks  . Placenta previa     hx and with current pregnancy  . Migraines     last one over 1 month ago  . Anxiety     no meds  . Depression     no meds  . Asthma   . ADHD (attention deficit hyperactivity disorder)   . Scoliosis    Past Surgical History  Procedure Laterality Date  . Cesarean section  2010    x 1 at 36 wks in [place]  . Svd  2003    x 1 in [place]  . Wisdom tooth extraction    . Tubal ligation     Family History  Problem Relation Age of Onset  . Cancer Mother   . Hypertension Sister   . Sickle cell trait Daughter   . Asthma Son   . Asthma Paternal Grandmother   . Pseudochol deficiency Neg Hx   . Malignant hyperthermia Neg Hx   . Anesthesia problems Neg Hx   . Hypotension Neg Hx    History  Substance Use Topics  . Smoking status: Current Every Day  Smoker -- 0.20 packs/day for 12 years    Types: Cigarettes  . Smokeless tobacco: Never Used  . Alcohol Use: Yes     Comment: on occ   OB History    Gravida Para Term Preterm AB TAB SAB Ectopic Multiple Living   3 3 1 2  0 0 0 0 0 3     Review of Systems  Respiratory: Positive for shortness of breath.   Cardiovascular: Positive for chest pain.  A complete 10 system review of systems was obtained and all systems are negative except as noted in the HPI and PMH.   Allergies  Review of patient's allergies indicates no known allergies.  Home Medications   Prior to Admission medications   Medication Sig Start Date End Date Taking? Authorizing Provider  amoxicillin-clavulanate (AUGMENTIN) 875-125 MG per tablet Take 1 tablet by mouth 2 (two) times daily. 10/07/14   Billy Fischer, MD  benzonatate (TESSALON) 100 MG capsule Take 1 capsule (100 mg total) by mouth 3 (three) times daily as needed for cough. 10/06/14   Francine Graven, DO  dexamethasone (DECADRON) 4 MG tablet Take 1 tablet (4 mg total) by mouth 2 (two) times daily. Patient not taking: Reported on 10/08/2014 09/15/14   Virgel Manifold, MD  gabapentin (NEURONTIN) 300 MG capsule Take 1 capsule (300 mg total) by mouth 3 (three) times daily. 10/07/14   Billy Fischer, MD  guaiFENesin-codeine 100-10 MG/5ML syrup Take 5 mLs by mouth 3 (three) times daily as needed for cough. 09/19/14   Heather Laisure, PA-C  ibuprofen (ADVIL,MOTRIN) 200 MG tablet Take 800 mg by mouth 3 (three) times daily as needed for moderate pain.     Historical Provider, MD  levonorgestrel (MIRENA) 20 MCG/24HR IUD 1 each by Intrauterine route continuous.     Historical Provider, MD  ondansetron (ZOFRAN ODT) 4 MG disintegrating tablet Take 1 tablet (4 mg total) by mouth every 8 (eight) hours as needed for nausea or vomiting. 10/08/14   Alvina Chou, PA-C  oxyCODONE-acetaminophen (PERCOCET/ROXICET) 5-325 MG per tablet 1 or 2 tabs PO q6h prn pain Patient taking  differently: Take 1 tablet by mouth every 6 (six) hours as needed for moderate pain. 1 or 2 tabs PO q6h prn pain 10/06/14   Francine Graven, DO  oxymetazoline (AFRIN) 0.05 % nasal spray Place 1 spray into both nostrils at bedtime as needed for congestion.    Historical Provider, MD  promethazine (PHENERGAN) 25 MG tablet Take 1 tablet (25 mg total) by mouth every 6 (six) hours as needed for nausea or vomiting. 10/08/14   Kaitlyn Szekalski, PA-C   BP 123/76 mmHg  Pulse 88  Temp(Src) 97.8 F (36.6 C) (Oral)  Resp 18  Ht 5\' 4"  (1.626 m)  Wt 180 lb (81.647 kg)  BMI 30.88 kg/m2  SpO2 98% Physical Exam  Constitutional: She is oriented to person, place, and time. She appears well-developed and well-nourished. No distress.  HENT:  Head: Normocephalic and atraumatic.  Eyes: Conjunctivae and EOM are normal.  Neck: Neck supple.  Cardiovascular: Normal rate and regular rhythm.  Exam reveals friction rub.   Pulmonary/Chest: No respiratory distress. She has no wheezes. She has no rales.  Tenderness left chest  Abdominal: Soft. She exhibits no distension.  Musculoskeletal: Normal range of motion. She exhibits no edema or tenderness.  Neurological: She is alert and oriented to person, place, and time.  Skin: Skin is warm and dry.  Psychiatric: She has a normal mood and affect. Her behavior is normal.  Nursing note and vitals reviewed.   ED Course  Procedures (including critical care time) DIAGNOSTIC STUDIES: Oxygen Saturation is 98% on RA, normal by my interpretation.    COORDINATION OF CARE: 3:18 AM- Pt advised of plan for treatment and pt agrees.  Labs Review Labs Reviewed  COMPREHENSIVE METABOLIC PANEL - Abnormal; Notable for the following:    Potassium 3.4 (*)    All other components within normal limits  CBC WITH DIFFERENTIAL  TROPONIN I  D-DIMER, QUANTITATIVE    Imaging Review Dg Chest 2 View  11/24/2014   CLINICAL DATA:  Mid chest pain and shortness of breath, acute onset.  Initial encounter.  EXAM: CHEST  2 VIEW  COMPARISON:  Chest radiograph performed 10/06/2014  FINDINGS: The lungs are well-aerated. Minimal retrocardiac airspace opacity may reflect atelectasis or mild pneumonia. There is no evidence of pleural effusion or pneumothorax.  The heart is borderline normal in size; the mediastinal contour is within normal limits. No acute osseous abnormalities are seen. Right convex thoracic scoliosis is again noted.  IMPRESSION: 1. Minimal retrocardiac opacity may reflect  atelectasis or possibly mild pneumonia. 2. Right convex thoracic scoliosis noted.   Electronically Signed   By: Garald Balding M.D.   On: 11/24/2014 04:39  I personally reviewed the imaging tests through PACS system I reviewed available ER/hospitalization records through the EMR    EKG Interpretation   Date/Time:  Monday November 24 2014 03:06:13 EST Ventricular Rate:  85 PR Interval:  191 QRS Duration: 88 QT Interval:  383 QTC Calculation: 455 R Axis:   82 Text Interpretation:  Sinus rhythm ST elev, probable normal early repol  pattern No significant change was found Confirmed by Tiffini Blacksher  MD, Oney Folz  ([id number]) on 11/24/2014 3:30:53 AM      MDM   Final diagnoses:  Chest pain    Patient's pain is reproducible on examination.  No family history of early cardiac disease.  All her risk factor for cardiac disease is tobacco abuse.  Her pain is reproducible I suspect this is more of a musculoskeletal type pain.  Doubt ACS.  EKG and troponin are normal.  D-dimer is negative.  Patient feeling better after short course of pain medicine and a GI cocktail in the ER.  Discharge home in good condition.  I personally performed the services described in this documentation, which was scribed in my presence. The recorded information has been reviewed and is accurate.    Hoy Morn, MD 11/24/14 [phone]

## 2014-11-24 NOTE — Discharge Instructions (Signed)

## 2014-11-24 NOTE — ED Notes (Signed)
Called x-ray for transport.

## 2014-11-27 ENCOUNTER — Emergency Department (HOSPITAL_COMMUNITY)
Admission: EM | Admit: 2014-11-27 | Discharge: 2014-11-28 | Disposition: A | Discharge status: Home / Self Care | Payer: Medicaid Other | Attending: Emergency Medicine | Admitting: Emergency Medicine

## 2014-11-27 ENCOUNTER — Encounter (HOSPITAL_COMMUNITY): Payer: Self-pay | Admitting: Emergency Medicine

## 2014-11-27 DIAGNOSIS — Z3202 Encounter for pregnancy test, result negative: Secondary | ICD-10-CM | POA: Insufficient documentation

## 2014-11-27 DIAGNOSIS — B359 Dermatophytosis, unspecified: Secondary | ICD-10-CM

## 2014-11-27 DIAGNOSIS — R1032 Left lower quadrant pain: Secondary | ICD-10-CM | POA: Diagnosis not present

## 2014-11-27 DIAGNOSIS — R110 Nausea: Secondary | ICD-10-CM | POA: Diagnosis not present

## 2014-11-27 DIAGNOSIS — Z8751 Personal history of pre-term labor: Secondary | ICD-10-CM

## 2014-11-27 DIAGNOSIS — F419 Anxiety disorder, unspecified: Secondary | ICD-10-CM

## 2014-11-27 DIAGNOSIS — M545 Low back pain: Secondary | ICD-10-CM | POA: Diagnosis present

## 2014-11-27 DIAGNOSIS — M79604 Pain in right leg: Secondary | ICD-10-CM | POA: Diagnosis not present

## 2014-11-27 DIAGNOSIS — J45909 Unspecified asthma, uncomplicated: Secondary | ICD-10-CM | POA: Insufficient documentation

## 2014-11-27 DIAGNOSIS — H9201 Otalgia, right ear: Secondary | ICD-10-CM | POA: Insufficient documentation

## 2014-11-27 DIAGNOSIS — R197 Diarrhea, unspecified: Secondary | ICD-10-CM | POA: Diagnosis not present

## 2014-11-27 DIAGNOSIS — Z79899 Other long term (current) drug therapy: Secondary | ICD-10-CM | POA: Insufficient documentation

## 2014-11-27 DIAGNOSIS — Z720 Tobacco use: Secondary | ICD-10-CM | POA: Insufficient documentation

## 2014-11-27 DIAGNOSIS — M79642 Pain in left hand: Secondary | ICD-10-CM

## 2014-11-27 DIAGNOSIS — M79605 Pain in left leg: Secondary | ICD-10-CM | POA: Diagnosis not present

## 2014-11-27 DIAGNOSIS — G43009 Migraine without aura, not intractable, without status migrainosus: Secondary | ICD-10-CM | POA: Insufficient documentation

## 2014-11-27 LAB — POC URINE PREG, ED: Preg Test, Ur: NEGATIVE

## 2014-11-27 NOTE — ED Notes (Signed)
Pt states she began having migraine around 430pm today. Pt states she has taken motrin and used heat/ice packs with no relief. Pt also states she is having left thumb pain. Pt reports pain down right side of neck stating she had a lymph node infection a couple of months ago but was given antibiotics. Pt is alert and oriented.

## 2014-11-28 ENCOUNTER — Emergency Department (HOSPITAL_COMMUNITY)
Admission: EM | Admit: 2014-11-28 | Discharge: 2014-11-29 | Disposition: A | Discharge status: Home / Self Care | Payer: Medicaid Other | Source: Home / Self Care | Attending: Emergency Medicine | Admitting: Emergency Medicine

## 2014-11-28 ENCOUNTER — Encounter (HOSPITAL_COMMUNITY): Payer: Self-pay | Admitting: Emergency Medicine

## 2014-11-28 DIAGNOSIS — R10814 Left lower quadrant abdominal tenderness: Secondary | ICD-10-CM

## 2014-11-28 DIAGNOSIS — R110 Nausea: Secondary | ICD-10-CM

## 2014-11-28 MED ORDER — PROMETHAZINE HCL 25 MG/ML IJ SOLN: 25.0000 mg | Freq: Once | INTRAMUSCULAR | Status: DC

## 2014-11-28 MED ORDER — DIPHENHYDRAMINE HCL 25 MG PO CAPS
25.0000 mg | ORAL_CAPSULE | Freq: Once | ORAL | Status: AC
  Administered 2014-11-28: 25 mg via ORAL
  Filled 2014-11-28: qty 1

## 2014-11-28 MED ORDER — DEXAMETHASONE SODIUM PHOSPHATE 10 MG/ML IJ SOLN: 10.0000 mg | Freq: Once | INTRAMUSCULAR | Status: DC

## 2014-11-28 MED ORDER — DEXAMETHASONE SODIUM PHOSPHATE 10 MG/ML IJ SOLN
10.0000 mg | Freq: Once | INTRAMUSCULAR | Status: AC
  Administered 2014-11-28: 10 mg via INTRAMUSCULAR
  Filled 2014-11-28: qty 1

## 2014-11-28 MED ORDER — PROMETHAZINE HCL 25 MG/ML IJ SOLN
25.0000 mg | Freq: Once | INTRAMUSCULAR | Status: AC
  Administered 2014-11-28: 25 mg via INTRAMUSCULAR
  Filled 2014-11-28: qty 1

## 2014-11-28 MED ORDER — CLOTRIMAZOLE 1 % EX CREA: TOPICAL_CREAM | CUTANEOUS | Status: DC

## 2014-11-28 NOTE — Discharge Instructions (Signed)

## 2014-11-28 NOTE — ED Provider Notes (Signed)
CSN: [id number]     Arrival date & time 11/27/14  2133 History   First MD Initiated Contact with Patient 11/28/14 0107     Chief Complaint  Patient presents with  . Migraine  . Thumb pain      (Consider location/radiation/quality/duration/timing/severity/associated sxs/prior Treatment) Patient is a 34 y.o. female presenting with migraines, hand pain, and ear pain. The history is provided by the patient. No language interpreter was used.  Migraine This is a new problem. The current episode started yesterday. The problem occurs constantly. Associated symptoms include headaches and nausea. Pertinent negatives include no abdominal pain, chest pain, chills, congestion, fever, myalgias, sore throat or vomiting. Associated symptoms comments: Headache to left side of her head that started yesterday, causing photophobia, nausea with vomiting. She has a history of headache with similar presentation with a diagnosis of Migraine. No fever. .  Hand Pain Associated symptoms include headaches and nausea. Pertinent negatives include no abdominal pain, chest pain, chills, congestion, fever, myalgias, sore throat or vomiting. Associated symptoms comments: Complains of left thumb pain similar to repetitive bouts of pain she has had since hand injury involving "hairline fracture 10 years ago. No new injury.Sydnee Cabal Location:  Right Associated symptoms: headaches   Associated symptoms: no abdominal pain, no congestion, no fever, no sore throat and no vomiting   Associated symptoms comment:  Posterior right ear pain for the past 2 months. No change in symptoms. No fever, redness or trauma.   Past Medical History  Diagnosis Date  . Preterm labor     c/s at 36 wks  . Placenta previa     hx and with current pregnancy  . Migraines     last one over 1 month ago  . Anxiety     no meds  . Depression     no meds  . Asthma   . ADHD (attention deficit hyperactivity disorder)   . Scoliosis    Past Surgical  History  Procedure Laterality Date  . Cesarean section  2010    x 1 at 36 wks in [place]  . Svd  2003    x 1 in [place]  . Wisdom tooth extraction    . Tubal ligation     Family History  Problem Relation Age of Onset  . Cancer Mother   . Hypertension Sister   . Sickle cell trait Daughter   . Asthma Son   . Asthma Paternal Grandmother   . Pseudochol deficiency Neg Hx   . Malignant hyperthermia Neg Hx   . Anesthesia problems Neg Hx   . Hypotension Neg Hx    History  Substance Use Topics  . Smoking status: Current Every Day Smoker -- 0.20 packs/day for 12 years    Types: Cigarettes  . Smokeless tobacco: Never Used  . Alcohol Use: Yes     Comment: on occ   OB History    Gravida Para Term Preterm AB TAB SAB Ectopic Multiple Living   3 3 1 2  0 0 0 0 0 3     Review of Systems  Constitutional: Negative for fever and chills.  HENT: Positive for ear pain. Negative for congestion and sore throat.   Respiratory: Negative.  Negative for shortness of breath.   Cardiovascular: Negative.  Negative for chest pain.  Gastrointestinal: Positive for nausea. Negative for vomiting and abdominal pain.  Musculoskeletal: Negative.  Negative for myalgias.  Skin: Negative.   Neurological: Positive for headaches.      Allergies  Review of patient's allergies indicates no known allergies.  Home Medications   Prior to Admission medications   Medication Sig Start Date End Date Taking? Authorizing Provider  ibuprofen (ADVIL,MOTRIN) 600 MG tablet Take 1 tablet (600 mg total) by mouth every 8 (eight) hours as needed. 11/24/14  Yes Hoy Morn, MD  levonorgestrel (MIRENA) 20 MCG/24HR IUD 1 each by Intrauterine route continuous.    Yes Historical Provider, MD  amoxicillin-clavulanate (AUGMENTIN) 875-125 MG per tablet Take 1 tablet by mouth 2 (two) times daily. Patient not taking: Reported on 11/24/2014 10/07/14   Billy Fischer, MD  benzonatate (TESSALON) 100 MG capsule Take 1 capsule (100 mg  total) by mouth 3 (three) times daily as needed for cough. Patient not taking: Reported on 11/28/2014 10/06/14   Francine Graven, DO  dexamethasone (DECADRON) 4 MG tablet Take 1 tablet (4 mg total) by mouth 2 (two) times daily. Patient not taking: Reported on 10/08/2014 09/15/14   Virgel Manifold, MD  gabapentin (NEURONTIN) 300 MG capsule Take 1 capsule (300 mg total) by mouth 3 (three) times daily. Patient not taking: Reported on 11/24/2014 10/07/14   Billy Fischer, MD  guaiFENesin-codeine 100-10 MG/5ML syrup Take 5 mLs by mouth 3 (three) times daily as needed for cough. Patient not taking: Reported on 11/28/2014 09/19/14   Hyman Bible, PA-C  omeprazole (PRILOSEC) 20 MG capsule Take 1 capsule (20 mg total) by mouth daily. Patient not taking: Reported on 11/28/2014 11/24/14   Hoy Morn, MD  ondansetron (ZOFRAN ODT) 4 MG disintegrating tablet Take 1 tablet (4 mg total) by mouth every 8 (eight) hours as needed for nausea or vomiting. Patient not taking: Reported on 11/28/2014 10/08/14   Alvina Chou, PA-C  oxyCODONE-acetaminophen (PERCOCET/ROXICET) 5-325 MG per tablet 1 or 2 tabs PO q6h prn pain Patient not taking: Reported on 11/28/2014 10/06/14   Francine Graven, DO  promethazine (PHENERGAN) 25 MG tablet Take 1 tablet (25 mg total) by mouth every 6 (six) hours as needed for nausea or vomiting. Patient not taking: Reported on 11/28/2014 10/08/14   Kaitlyn Szekalski, PA-C   BP 101/56 mmHg  Pulse 72  Temp(Src) 97.5 F (36.4 C) (Oral)  Resp 18  SpO2 96%  LMP 11/05/2014 Physical Exam  Constitutional: She is oriented to person, place, and time. She appears well-developed and well-nourished.  HENT:  Head: Normocephalic.  Posterior right ear has normopigmented, wet fissure in ACA that is fungal appearing.  Eyes: Pupils are equal, round, and reactive to light.  Neck: Normal range of motion. Neck supple.  Cardiovascular: Normal rate and regular rhythm.   Pulmonary/Chest: Effort normal and  breath sounds normal.  Abdominal: Soft. Bowel sounds are normal. There is no tenderness. There is no rebound and no guarding.  Musculoskeletal: Normal range of motion.  Left hand without swelling, redness. FROM wrist and all digits. Mildly tender over first interphalangeal space.   Neurological: She is alert and oriented to person, place, and time. She has normal strength and normal reflexes. No sensory deficit. She displays a negative Romberg sign. Coordination normal.  Skin: Skin is warm and dry. No rash noted.  Psychiatric: She has a normal mood and affect.    ED Course  Procedures (including critical care time) Labs Review Labs Reviewed  POC URINE PREG, ED    Imaging Review No results found.   EKG Interpretation None      MDM   Final diagnoses:  None    1. Migraine headache 2. Recurrent left hand pain  3. Tinea, right ear  She is feeling better with headache cocktail of medications. Ambulatory. Will discharge home with PCP resources.    Dewaine Oats, PA-C 11/28/14 [place], MD 11/28/14 [phone]

## 2014-11-28 NOTE — ED Notes (Signed)
Patient here with complaint of bilateral leg pain which begins in lower back and radiates down legs. States pain is burning in nature. Pain began today after waking up. States that she isn't aware of any injury, denies heavy lifting. Currently legs are painful, not swollen, pedal pulses are intact bilaterally.

## 2014-11-28 NOTE — ED Provider Notes (Signed)
CSN: [id number]     Arrival date & time 11/28/14  2256 History  This chart was scribed for Al Corpus, PA-C with Dr. Christy Gentles, MD by Edison Simon, ED Scribe. This patient was seen in room TR09C/TR09C and the patient's care was started at 11:40 PM.    Chief Complaint  Patient presents with  . Back Pain  . Leg Pain   HPI  HPI Comments: Penny [name] is a 33 y.o. female who presents to the Emergency Department complaining of burning pain to bilateral legs with onset on waking this afternoon. She denies ever having had similar symptoms before. She denies recent injury. She notes associated nausea. No numbness, tingling or weakness. She states she has nausea with migraines and had a migraine recently. She states her stools have recently been loser than usual but notes she has been eating more vegetables. No blood.  She denies history of CA or IVDA. She reports history of scoliosis but denies other medical problems or chronic back problem. She states her last period was less than 1 month ago and notes she has Mirena IUD. She reports 2 prior C-section. She denies tingling, weakness, incontinence, saddle anesthesia, fever, chills, night sweats, weight loss, dysuria, abdominal pain, blood in stool, or diarrhea.   Past Medical History  Diagnosis Date  . Preterm labor     c/s at 36 wks  . Placenta previa     hx and with current pregnancy  . Migraines     last one over 1 month ago  . Anxiety     no meds  . Depression     no meds  . Asthma   . ADHD (attention deficit hyperactivity disorder)   . Scoliosis    Past Surgical History  Procedure Laterality Date  . Cesarean section  2010    x 1 at 36 wks in [place]  . Svd  2003    x 1 in [place]  . Wisdom tooth extraction    . Tubal ligation     Family History  Problem Relation Age of Onset  . Cancer Mother   . Hypertension Sister   . Sickle cell trait Daughter   . Asthma Son   . Asthma Paternal Grandmother   . Pseudochol deficiency Neg  Hx   . Malignant hyperthermia Neg Hx   . Anesthesia problems Neg Hx   . Hypotension Neg Hx    History  Substance Use Topics  . Smoking status: Current Every Day Smoker -- 0.20 packs/day for 12 years    Types: Cigarettes  . Smokeless tobacco: Never Used  . Alcohol Use: Yes     Comment: on occ   OB History    Gravida Para Term Preterm AB TAB SAB Ectopic Multiple Living   3 3 1 2  0 0 0 0 0 3     Review of Systems  Constitutional: Negative for fever, chills and unexpected weight change.  Gastrointestinal: Positive for diarrhea. Negative for vomiting, abdominal pain and blood in stool.  Genitourinary: Negative for dysuria, urgency, frequency, vaginal bleeding, vaginal discharge and vaginal pain.  Musculoskeletal: Positive for back pain.       Pain to bilateral legs  Neurological: Negative for weakness and numbness.  All other systems reviewed and are negative.     Allergies  Review of patient's allergies indicates no known allergies.  Home Medications   Prior to Admission medications   Medication Sig Start Date End Date Taking? Authorizing Provider  amoxicillin-clavulanate (AUGMENTIN) 875-125 MG per  tablet Take 1 tablet by mouth 2 (two) times daily. Patient not taking: Reported on 11/24/2014 10/07/14   Billy Fischer, MD  benzonatate (TESSALON) 100 MG capsule Take 1 capsule (100 mg total) by mouth 3 (three) times daily as needed for cough. Patient not taking: Reported on 11/28/2014 10/06/14   Francine Graven, DO  clotrimazole (LOTRIMIN) 1 % cream Apply to affected area 2 times daily 11/28/14   Nehemiah Settle A Upstill, PA-C  dexamethasone (DECADRON) 4 MG tablet Take 1 tablet (4 mg total) by mouth 2 (two) times daily. Patient not taking: Reported on 10/08/2014 09/15/14   Virgel Manifold, MD  gabapentin (NEURONTIN) 300 MG capsule Take 1 capsule (300 mg total) by mouth 3 (three) times daily. Patient not taking: Reported on 11/24/2014 10/07/14   Billy Fischer, MD  guaiFENesin-codeine 100-10  MG/5ML syrup Take 5 mLs by mouth 3 (three) times daily as needed for cough. Patient not taking: Reported on 11/28/2014 09/19/14   Hyman Bible, PA-C  ibuprofen (ADVIL,MOTRIN) 600 MG tablet Take 1 tablet (600 mg total) by mouth every 8 (eight) hours as needed. 11/24/14   Hoy Morn, MD  levonorgestrel (MIRENA) 20 MCG/24HR IUD 1 each by Intrauterine route continuous.     Historical Provider, MD  omeprazole (PRILOSEC) 20 MG capsule Take 1 capsule (20 mg total) by mouth daily. Patient not taking: Reported on 11/28/2014 11/24/14   Hoy Morn, MD  ondansetron (ZOFRAN ODT) 4 MG disintegrating tablet Take 1 tablet (4 mg total) by mouth every 8 (eight) hours as needed for nausea or vomiting. Patient not taking: Reported on 11/28/2014 10/08/14   Alvina Chou, PA-C  oxyCODONE-acetaminophen (PERCOCET/ROXICET) 5-325 MG per tablet 1 or 2 tabs PO q6h prn pain Patient not taking: Reported on 11/28/2014 10/06/14   Francine Graven, DO  promethazine (PHENERGAN) 25 MG tablet Take 1 tablet (25 mg total) by mouth every 6 (six) hours as needed for nausea or vomiting. Patient not taking: Reported on 11/28/2014 10/08/14   Alvina Chou, PA-C   BP 112/73 mmHg  Pulse 63  Temp(Src) 98.1 F (36.7 C) (Oral)  Resp 22  SpO2 99%  LMP 11/05/2014 (Exact Date) Physical Exam  Constitutional: She appears well-developed and well-nourished. No distress.  HENT:  Head: Normocephalic and atraumatic.  Eyes: Conjunctivae are normal. Right eye exhibits no discharge. Left eye exhibits no discharge.  Cardiovascular: Normal rate, regular rhythm and normal heart sounds.   Pulmonary/Chest: Effort normal and breath sounds normal. No respiratory distress. She has no wheezes.  Abdominal: Soft. Bowel sounds are normal. She exhibits no distension.  Diffuse abdominal tenderness worse in left lower quadrant without rebound, rigidity, guarding.  Musculoskeletal:  No midline back tenderness, step off or crepitus. Right and left  sided lower back tenderness. No CVA tenderness.  Neurological: She is alert. Coordination normal.  Equal muscle tone. 5/5 strength in lower extremities. DTR equal and intact. Positive bilateral straight leg test. Antalgic gait.   Skin: Skin is warm and dry. She is not diaphoretic.  Nursing note and vitals reviewed.   ED Course  Procedures (including critical care time)  DIAGNOSTIC STUDIES: Oxygen Saturation is 99% on room air, normal by my interpretation.    COORDINATION OF CARE: 11:52 PM Discussed treatment plan with patient at beside, the patient agrees with the plan and has no further questions at this time.   Labs Review Labs Reviewed - No data to display  Imaging Review No results found.   EKG Interpretation None  MDM   Final diagnoses:  None   Patient with back pain. No loss of bowel or bladder control. No saddle anesthesia. No fever, night sweats, weight loss, h/o cancer, IVDU. VSS. No neurological deficits and normal neuro exam. Patient can walk but states is painful. No concern for cauda equina. Patient stable for outpatient management with PCP/orthopedics for her back pain.  Patient also with nausea, diarrhea and diffuse abdominal tenderness worse in left lower quadrant on exam. No evidence of peritonitis on exam. Patient afebrile in ED and found to have leukocytosis of 24.5. Will proceed to CT to evaluate for diverticulitis. No electrolyte abnormalities. No kidney or liver abnormalities. Patient's urine with hematuria.   Patient signout to [facility] PA-C at shift change. Plan for CT and disposition accordingly. Patient to follow-up with orthopedics for her back pain.  I personally performed the services described in this documentation, which was scribed in my presence. The recorded information has been reviewed and is accurate.   Pura Spice, PA-C 11/29/14 [place], MD 11/30/14 [phone]

## 2014-11-29 ENCOUNTER — Emergency Department (HOSPITAL_COMMUNITY): Payer: Medicaid Other

## 2014-11-29 ENCOUNTER — Encounter (HOSPITAL_COMMUNITY): Payer: Self-pay | Admitting: Radiology

## 2014-11-29 LAB — CBC WITH DIFFERENTIAL/PLATELET
Basophils Absolute: 0 10*3/uL (ref 0.0–0.1)
Basophils Relative: 0 % (ref 0–1)
Eosinophils Absolute: 0 10*3/uL (ref 0.0–0.7)
Eosinophils Relative: 0 % (ref 0–5)
HCT: 43.2 % (ref 36.0–46.0)
Hemoglobin: 14.9 g/dL (ref 12.0–15.0)
Lymphocytes Relative: 7 % — ABNORMAL LOW (ref 12–46)
Lymphs Abs: 1.7 10*3/uL (ref 0.7–4.0)
MCH: 29.2 pg (ref 26.0–34.0)
MCHC: 34.5 g/dL (ref 30.0–36.0)
MCV: 84.5 fL (ref 78.0–100.0)
Monocytes Absolute: 1.2 10*3/uL — ABNORMAL HIGH (ref 0.1–1.0)
Monocytes Relative: 5 % (ref 3–12)
Neutro Abs: 21.6 10*3/uL — ABNORMAL HIGH (ref 1.7–7.7)
Neutrophils Relative %: 88 % — ABNORMAL HIGH (ref 43–77)
Platelets: 355 10*3/uL (ref 150–400)
RBC: 5.11 MIL/uL (ref 3.87–5.11)
RDW: 13.6 % (ref 11.5–15.5)
WBC: 24.5 10*3/uL — ABNORMAL HIGH (ref 4.0–10.5)

## 2014-11-29 LAB — COMPREHENSIVE METABOLIC PANEL
ALT: 16 U/L (ref 0–35)
AST: 31 U/L (ref 0–37)
Albumin: 3.9 g/dL (ref 3.5–5.2)
Alkaline Phosphatase: 76 U/L (ref 39–117)
Anion gap: 10 (ref 5–15)
BUN: 7 mg/dL (ref 6–23)
CO2: 20 mmol/L (ref 19–32)
Calcium: 9.4 mg/dL (ref 8.4–10.5)
Chloride: 108 mEq/L (ref 96–112)
Creatinine, Ser: 0.69 mg/dL (ref 0.50–1.10)
GFR calc Af Amer: >90 mL/min (ref >90)
GFR calc non Af Amer: >90 mL/min (ref >90)
Glucose, Bld: 255 mg/dL — ABNORMAL HIGH (ref 70–99)
Potassium: 3.7 mmol/L (ref 3.5–5.1)
Sodium: 138 mmol/L (ref 135–145)
Total Bilirubin: 0.4 mg/dL (ref 0.3–1.2)
Total Protein: 7.4 g/dL (ref 6.0–8.3)

## 2014-11-29 LAB — URINE MICROSCOPIC-ADD ON

## 2014-11-29 LAB — URINALYSIS, ROUTINE W REFLEX MICROSCOPIC
Bilirubin Urine: NEGATIVE
Glucose, UA: >1000 mg/dL — AB
Ketones, ur: 15 mg/dL — AB
Leukocytes, UA: NEGATIVE
Nitrite: NEGATIVE
Protein, ur: NEGATIVE mg/dL
Specific Gravity, Urine: 1.042 — ABNORMAL HIGH (ref 1.005–1.030)
Urobilinogen, UA: 0.2 mg/dL (ref 0.0–1.0)
pH: 5.5 (ref 5.0–8.0)

## 2014-11-29 LAB — POC URINE PREG, ED: Preg Test, Ur: NEGATIVE

## 2014-11-29 LAB — LIPASE, BLOOD: Lipase: 35 U/L (ref 11–59)

## 2014-11-29 MED ORDER — PROMETHAZINE HCL 25 MG/ML IJ SOLN
25.0000 mg | Freq: Once | INTRAMUSCULAR | Status: AC
  Administered 2014-11-29: 25 mg via INTRAVENOUS
  Filled 2014-11-29: qty 1

## 2014-11-29 MED ORDER — SODIUM CHLORIDE 0.9 % IV BOLUS (SEPSIS)
1000.0000 mL | Freq: Once | INTRAVENOUS | Status: AC
  Administered 2014-11-29: 1000 mL via INTRAVENOUS

## 2014-11-29 MED ORDER — ONDANSETRON HCL 4 MG/2ML IJ SOLN
4.0000 mg | Freq: Once | INTRAMUSCULAR | Status: AC
  Administered 2014-11-29: 4 mg via INTRAVENOUS
  Filled 2014-11-29: qty 2

## 2014-11-29 MED ORDER — PROMETHAZINE HCL 25 MG PO TABS: 25.0000 mg | ORAL_TABLET | Freq: Four times a day (QID) | ORAL | Status: DC | PRN

## 2014-11-29 MED ORDER — HYDROCODONE-ACETAMINOPHEN 5-325 MG PO TABS: 2.0000 | ORAL_TABLET | ORAL | Status: DC | PRN

## 2014-11-29 MED ORDER — IOHEXOL 300 MG/ML  SOLN
100.0000 mL | Freq: Once | INTRAMUSCULAR | Status: AC | PRN
  Administered 2014-11-29: 100 mL via INTRAVENOUS

## 2014-11-29 MED ORDER — MORPHINE SULFATE 4 MG/ML IJ SOLN
4.0000 mg | Freq: Once | INTRAMUSCULAR | Status: AC
  Administered 2014-11-29: 4 mg via INTRAVENOUS
  Filled 2014-11-29: qty 1

## 2014-11-29 NOTE — Discharge Instructions (Signed)
Take vicodin as needed for pain. Take phenergan as needed for nausea. Refer to attached documents for more information. Follow up with your doctor.

## 2014-11-29 NOTE — ED Notes (Signed)
CT notified that the pt has finished her contrast.  

## 2014-11-29 NOTE — ED Notes (Signed)
CT notified that the pt has not gotten her contrast.

## 2014-11-29 NOTE — ED Provider Notes (Signed)
2:23 AM Patient signed out me by Doyce Loose, PA-C. Patient pending CT abdomen and pelvis to rule out diverticulitis.   5:38 AM Patient's CT abdomen pelvis shows no diverticulitis. Vitals stable and patient afebrile. Patient complains of nausea. Patient will be discharged with vicodin and phenergan for symptoms. Patient likely has a viral illness. No RUQ tenderness. No fever. Unable to explain leukocytosis. Patient will be discharged with instructions to return with worsening or concerning symptoms.   Results for orders placed or performed during the hospital encounter of 11/28/14  CBC with Differential  Result Value Ref Range   WBC 24.5 (H) 4.0 - 10.5 K/uL   RBC 5.11 3.87 - 5.11 MIL/uL   Hemoglobin 14.9 12.0 - 15.0 g/dL   HCT 43.2 36.0 - 46.0 %   MCV 84.5 78.0 - 100.0 fL   MCH 29.2 26.0 - 34.0 pg   MCHC 34.5 30.0 - 36.0 g/dL   RDW 13.6 11.5 - 15.5 %   Platelets 355 150 - 400 K/uL   Neutrophils Relative % 88 (H) 43 - 77 %   Neutro Abs 21.6 (H) 1.7 - 7.7 K/uL   Lymphocytes Relative 7 (L) 12 - 46 %   Lymphs Abs 1.7 0.7 - 4.0 K/uL   Monocytes Relative 5 3 - 12 %   Monocytes Absolute 1.2 (H) 0.1 - 1.0 K/uL   Eosinophils Relative 0 0 - 5 %   Eosinophils Absolute 0.0 0.0 - 0.7 K/uL   Basophils Relative 0 0 - 1 %   Basophils Absolute 0.0 0.0 - 0.1 K/uL  Lipase, blood  Result Value Ref Range   Lipase 35 11 - 59 U/L  Comprehensive metabolic panel  Result Value Ref Range   Sodium 138 135 - 145 mmol/L   Potassium 3.7 3.5 - 5.1 mmol/L   Chloride 108 96 - 112 mEq/L   CO2 20 19 - 32 mmol/L   Glucose, Bld 255 (H) 70 - 99 mg/dL   BUN 7 6 - 23 mg/dL   Creatinine, Ser 0.69 0.50 - 1.10 mg/dL   Calcium 9.4 8.4 - 10.5 mg/dL   Total Protein 7.4 6.0 - 8.3 g/dL   Albumin 3.9 3.5 - 5.2 g/dL   AST 31 0 - 37 U/L   ALT 16 0 - 35 U/L   Alkaline Phosphatase 76 39 - 117 U/L   Total Bilirubin 0.4 0.3 - 1.2 mg/dL   GFR calc non Af Amer >90 >90 mL/min   GFR calc Af Amer >90 >90 mL/min   Anion gap 10 5  - 15  Urinalysis, Routine w reflex microscopic  Result Value Ref Range   Color, Urine YELLOW YELLOW   APPearance CLOUDY (A) CLEAR   Specific Gravity, Urine 1.042 (H) 1.005 - 1.030   pH 5.5 5.0 - 8.0   Glucose, UA >1000 (A) NEGATIVE mg/dL   Hgb urine dipstick MODERATE (A) NEGATIVE   Bilirubin Urine NEGATIVE NEGATIVE   Ketones, ur 15 (A) NEGATIVE mg/dL   Protein, ur NEGATIVE NEGATIVE mg/dL   Urobilinogen, UA 0.2 0.0 - 1.0 mg/dL   Nitrite NEGATIVE NEGATIVE   Leukocytes, UA NEGATIVE NEGATIVE  Urine microscopic-add on  Result Value Ref Range   Squamous Epithelial / LPF RARE RARE   RBC / HPF 0-2 <3 RBC/hpf   Bacteria, UA RARE RARE   Crystals CA OXALATE CRYSTALS (A) NEGATIVE  POC Urine Pregnancy, ED (do NOT order at [facility])  Result Value Ref Range   Preg Test, Ur NEGATIVE NEGATIVE  Dg Chest 2 View  11/24/2014   CLINICAL DATA:  Mid chest pain and shortness of breath, acute onset. Initial encounter.  EXAM: CHEST  2 VIEW  COMPARISON:  Chest radiograph performed 10/06/2014  FINDINGS: The lungs are well-aerated. Minimal retrocardiac airspace opacity may reflect atelectasis or mild pneumonia. There is no evidence of pleural effusion or pneumothorax.  The heart is borderline normal in size; the mediastinal contour is within normal limits. No acute osseous abnormalities are seen. Right convex thoracic scoliosis is again noted.  IMPRESSION: 1. Minimal retrocardiac opacity may reflect atelectasis or possibly mild pneumonia. 2. Right convex thoracic scoliosis noted.   Electronically Signed   By: Garald Balding M.D.   On: 11/24/2014 04:39   Ct Abdomen Pelvis W Contrast  11/29/2014   CLINICAL DATA:  Initial evaluation for acute left lower quadrant abdominal tenderness.  EXAM: CT ABDOMEN AND PELVIS WITH CONTRAST  TECHNIQUE: Multidetector CT imaging of the abdomen and pelvis was performed using the standard protocol following bolus administration of intravenous contrast.  CONTRAST:  156mL OMNIPAQUE IOHEXOL 300  MG/ML  SOLN  COMPARISON:  Prior CT from 09/23/2013.  FINDINGS: The visualized lung bases are clear. The liver demonstrates a normal contrast enhanced appearance. There is question of mild hazy stranding about the gallbladder. No radiopaque gallstones identified. No biliary dilatation. The spleen, adrenal glands, and pancreas demonstrate a normal contrast enhanced appearance.  1.3 cm cyst present within the upper pole the left kidney. Kidneys are otherwise unremarkable without evidence of nephrolithiasis, hydronephrosis, or focal enhancing renal mass.  Stomach within normal limits. No evidence for bowel obstruction. No abnormal wall thickening, mucosal edema, or inflammatory fat stranding seen about the bowels. Appendix well visualized in the right lower quadrant and is of normal caliber and appearance without associated inflammatory changes to suggest acute appendicitis.  Partially distended bladder is grossly unremarkable. IUD in place within the uterus and appears to be appropriately positioned. Ovaries within normal limits. Sequelae of prior tubal ligation noted.  No free air or fluid. No pathologically enlarged intra-abdominal pelvic lymph nodes. Normal intravascular enhancement seen throughout the abdomen and pelvis.  Scoliosis noted. No acute osseous abnormality. No worrisome lytic or blastic osseous lesions.  IMPRESSION: 1. No definite acute intra-abdominal or pelvic process identified. No findings to explain left lower quadrant tenderness. 2. Question minimal hazy stranding about the gallbladder. Further evaluation with dedicated right upper quadrant ultrasound could be performed if there is clinical concern for acute biliary pathology. No radiopaque gallstones or biliary dilatation identified on this exam. 3. Normal appendix.   Electronically Signed   By: Jeannine Boga M.D.   On: 11/29/2014 05:12      Alvina Chou, PA-C 11/29/14 [place], MD 11/30/14 [phone]

## 2014-12-29 ENCOUNTER — Emergency Department (HOSPITAL_COMMUNITY)
Admission: EM | Admit: 2014-12-29 | Discharge: 2014-12-30 | Disposition: A | Discharge status: Home / Self Care | Payer: Medicaid Other | Attending: Emergency Medicine | Admitting: Emergency Medicine

## 2014-12-29 ENCOUNTER — Encounter (HOSPITAL_COMMUNITY): Payer: Self-pay | Admitting: *Deleted

## 2014-12-29 DIAGNOSIS — Z720 Tobacco use: Secondary | ICD-10-CM | POA: Diagnosis not present

## 2014-12-29 DIAGNOSIS — G43809 Other migraine, not intractable, without status migrainosus: Secondary | ICD-10-CM | POA: Diagnosis not present

## 2014-12-29 DIAGNOSIS — Z7952 Long term (current) use of systemic steroids: Secondary | ICD-10-CM | POA: Insufficient documentation

## 2014-12-29 DIAGNOSIS — M419 Scoliosis, unspecified: Secondary | ICD-10-CM | POA: Diagnosis not present

## 2014-12-29 DIAGNOSIS — F419 Anxiety disorder, unspecified: Secondary | ICD-10-CM | POA: Insufficient documentation

## 2014-12-29 DIAGNOSIS — Z79899 Other long term (current) drug therapy: Secondary | ICD-10-CM | POA: Diagnosis not present

## 2014-12-29 DIAGNOSIS — Z3202 Encounter for pregnancy test, result negative: Secondary | ICD-10-CM | POA: Insufficient documentation

## 2014-12-29 DIAGNOSIS — R51 Headache: Secondary | ICD-10-CM | POA: Diagnosis present

## 2014-12-29 DIAGNOSIS — J45909 Unspecified asthma, uncomplicated: Secondary | ICD-10-CM | POA: Diagnosis not present

## 2014-12-29 DIAGNOSIS — M545 Low back pain: Secondary | ICD-10-CM | POA: Insufficient documentation

## 2014-12-29 DIAGNOSIS — R112 Nausea with vomiting, unspecified: Secondary | ICD-10-CM

## 2014-12-29 DIAGNOSIS — R5381 Other malaise: Secondary | ICD-10-CM

## 2014-12-29 MED ORDER — DIPHENHYDRAMINE HCL 50 MG/ML IJ SOLN
12.5000 mg | Freq: Once | INTRAMUSCULAR | Status: AC
  Administered 2014-12-30: 12.5 mg via INTRAVENOUS
  Filled 2014-12-29: qty 1

## 2014-12-29 MED ORDER — METOCLOPRAMIDE HCL 5 MG/ML IJ SOLN
10.0000 mg | Freq: Once | INTRAMUSCULAR | Status: AC
  Administered 2014-12-30: 10 mg via INTRAVENOUS
  Filled 2014-12-29: qty 2

## 2014-12-29 MED ORDER — ONDANSETRON HCL 4 MG/2ML IJ SOLN
4.0000 mg | Freq: Once | INTRAMUSCULAR | Status: AC
  Administered 2014-12-30: 4 mg via INTRAVENOUS
  Filled 2014-12-29: qty 2

## 2014-12-29 NOTE — ED Provider Notes (Signed)
CSN: [id number]     Arrival date & time 12/29/14  2140 History  This chart was scribed for Penny Drape, MD by [name], ED Scribe. This patient was seen in room B17C/B17C and the patient's care was started at 11:38 PM.    Chief Complaint  Patient presents with  . Headache  . Emesis   The history is provided by the patient. No language interpreter was used.   HPI Comments: Penny [name] is a 34 y.o. female migraines who presents to the Emergency Department complaining of gradual generalized  HA onset today at 4 PM. Pt states she has associated fatigue nausea and vomiting. Pt states she is having some low back pain as well. Pt states she has tried motrin with no relief. Pt states that she woke up this morning not feeling well. Pt states that she has had an increased amount of flatulence over the past few days as well. Pt states she has tried sleeping with no relief. Pt states that she has been around her children that have been sick as well. Pt states that her HA feels similar to previous migraines but states that she just doesn't feel well in other parts of her body. Pt denies fever, chills or other related symptoms.   Pt states that she has been feeling "sick" since October 2015. Pt states that her symptoms started with right ear pain that has not resolved. Pt states she feels as if the ear pain is moving to her left ear now. Pt states that he lymph nodes were swollen as well Pt states that she had a cough as well. Pt states that her symptoms improved after being prescribed cough syrup with hydrocodone. Pt states ever since this began she has had an decreased appetite.   Past Medical History  Diagnosis Date  . Preterm labor     c/s at 36 wks  . Placenta previa     hx and with current pregnancy  . Migraines     last one over 1 month ago  . Anxiety     no meds  . Depression     no meds  . Asthma   . ADHD (attention deficit hyperactivity disorder)   . Scoliosis    Past Surgical  History  Procedure Laterality Date  . Cesarean section  2010    x 1 at 36 wks in [place]  . Svd  2003    x 1 in [place]  . Wisdom tooth extraction    . Tubal ligation     Family History  Problem Relation Age of Onset  . Cancer Mother   . Hypertension Sister   . Sickle cell trait Daughter   . Asthma Son   . Asthma Paternal Grandmother   . Pseudochol deficiency Neg Hx   . Malignant hyperthermia Neg Hx   . Anesthesia problems Neg Hx   . Hypotension Neg Hx    History  Substance Use Topics  . Smoking status: Current Every Day Smoker -- 0.20 packs/day for 12 years    Types: Cigarettes  . Smokeless tobacco: Never Used  . Alcohol Use: Yes     Comment: on occ   OB History    Gravida Para Term Preterm AB TAB SAB Ectopic Multiple Living   3 3 1 2  0 0 0 0 0 3      Review of Systems  Constitutional: Negative for fever and chills.  Gastrointestinal: Positive for nausea and vomiting.  Musculoskeletal: Positive for back  pain.  Neurological: Positive for headaches.    Allergies  Review of patient's allergies indicates no known allergies.  Home Medications   Prior to Admission medications   Medication Sig Start Date End Date Taking? Authorizing Provider  amoxicillin-clavulanate (AUGMENTIN) 875-125 MG per tablet Take 1 tablet by mouth 2 (two) times daily. Patient not taking: Reported on 11/24/2014 10/07/14   Billy Fischer, MD  benzonatate (TESSALON) 100 MG capsule Take 1 capsule (100 mg total) by mouth 3 (three) times daily as needed for cough. Patient not taking: Reported on 11/28/2014 10/06/14   Francine Graven, DO  clotrimazole (LOTRIMIN) 1 % cream Apply to affected area 2 times daily 11/28/14   Nehemiah Settle A Upstill, PA-C  dexamethasone (DECADRON) 4 MG tablet Take 1 tablet (4 mg total) by mouth 2 (two) times daily. Patient not taking: Reported on 10/08/2014 09/15/14   Virgel Manifold, MD  gabapentin (NEURONTIN) 300 MG capsule Take 1 capsule (300 mg total) by mouth 3 (three) times  daily. Patient not taking: Reported on 11/24/2014 10/07/14   Billy Fischer, MD  guaiFENesin-codeine 100-10 MG/5ML syrup Take 5 mLs by mouth 3 (three) times daily as needed for cough. Patient not taking: Reported on 11/28/2014 09/19/14   Hyman Bible, PA-C  HYDROcodone-acetaminophen (NORCO/VICODIN) 5-325 MG per tablet Take 2 tablets by mouth every 4 (four) hours as needed for moderate pain or severe pain. 11/29/14   Kaitlyn Szekalski, PA-C  ibuprofen (ADVIL,MOTRIN) 600 MG tablet Take 1 tablet (600 mg total) by mouth every 8 (eight) hours as needed. 11/24/14   Hoy Morn, MD  levonorgestrel (MIRENA) 20 MCG/24HR IUD 1 each by Intrauterine route continuous.     Historical Provider, MD  omeprazole (PRILOSEC) 20 MG capsule Take 1 capsule (20 mg total) by mouth daily. Patient not taking: Reported on 11/28/2014 11/24/14   Hoy Morn, MD  ondansetron (ZOFRAN ODT) 4 MG disintegrating tablet Take 1 tablet (4 mg total) by mouth every 8 (eight) hours as needed for nausea or vomiting. Patient not taking: Reported on 11/28/2014 10/08/14   Alvina Chou, PA-C  oxyCODONE-acetaminophen (PERCOCET/ROXICET) 5-325 MG per tablet 1 or 2 tabs PO q6h prn pain Patient not taking: Reported on 11/28/2014 10/06/14   Francine Graven, DO  promethazine (PHENERGAN) 25 MG tablet Take 1 tablet (25 mg total) by mouth every 6 (six) hours as needed for nausea or vomiting. 11/29/14   Kaitlyn Szekalski, PA-C   BP 110/75 mmHg  Pulse 105  Temp(Src) 98.1 F (36.7 C) (Oral)  Resp 20  SpO2 97%   Physical Exam  Constitutional: She is oriented to person, place, and time. She appears well-developed and well-nourished.  HENT:  Head: Normocephalic and atraumatic.  Right Ear: External ear normal.  Left Ear: External ear normal.  Nose: Nose normal.  Mouth/Throat: Oropharynx is clear and moist.  ttp over forehead, occiput  Eyes: Conjunctivae and EOM are normal. Pupils are equal, round, and reactive to light.  Neck: Normal range of  motion. Neck supple. No JVD present. No tracheal deviation present. No thyromegaly present.  Cardiovascular: Normal rate, regular rhythm, normal heart sounds and intact distal pulses.  Exam reveals no gallop and no friction rub.   No murmur heard. Pulmonary/Chest: Effort normal and breath sounds normal. No stridor. No respiratory distress. She has no wheezes. She has no rales. She exhibits no tenderness.  Abdominal: Soft. Bowel sounds are normal. She exhibits no distension and no mass. There is no tenderness. There is no rebound and no guarding.  Musculoskeletal:  Normal range of motion. She exhibits no edema or tenderness.  Lymphadenopathy:    She has no cervical adenopathy.  Neurological: She is alert and oriented to person, place, and time. She displays normal reflexes. No cranial nerve deficit. She exhibits normal muscle tone. Coordination normal.  Skin: Skin is warm and dry. No rash noted. No erythema. No pallor.  Psychiatric: Her behavior is normal. Judgment and thought content normal.  Flat affect  Nursing note and vitals reviewed.   ED Course  Procedures (including critical care time) DIAGNOSTIC STUDIES: Oxygen Saturation is 97% on RA, normal by my interpretation.    COORDINATION OF CARE: 11:53 PM-Discussed treatment plan with pt at bedside and pt agreed to plan.     Labs Review Labs Reviewed  URINALYSIS, ROUTINE W REFLEX MICROSCOPIC - Abnormal; Notable for the following:    APPearance CLOUDY (*)    Specific Gravity, Urine 1.035 (*)    Bilirubin Urine SMALL (*)    Ketones, ur 15 (*)    All other components within normal limits  COMPREHENSIVE METABOLIC PANEL  PREGNANCY, URINE  CBC WITH DIFFERENTIAL/PLATELET  CBC WITH DIFFERENTIAL/PLATELET    Imaging Review No results found.   EKG Interpretation None      MDM   Final diagnoses:  Other type of nonintractable migraine  Non-intractable vomiting with nausea, vomiting of unspecified type  Malaise     I  personally performed the services described in this documentation, which was scribed in my presence. The recorded information has been reviewed and is accurate.  Pt with ongoing right ear pain for several months, decreased appetitie, fatigue.  Today with increased fatigue, vomiting x 1 with headache.  Children with vomiting.  H/o headaches.  Plan for labs, iv fluids, headache cocktail.  Pain with palpation of head, occiput, left back, suspect musculoskeletal/tension headache.     Penny Drape, MD 12/30/14 [phone]

## 2014-12-29 NOTE — ED Notes (Signed)
Pt woke up this morning not feeling well.  She said she was trying to sleep all day.  Started with headache at 4pm.  Took 800 mg of motrin with no relief.  Started feeling nauseated at 8:30.  Pt then vomited soon after.   Is c/o headache and just not feeling herself.  Pt is alert and oriented.

## 2014-12-30 LAB — CBC WITH DIFFERENTIAL/PLATELET
Basophils Absolute: 0 10*3/uL (ref 0.0–0.1)
Basophils Relative: 0 % (ref 0–1)
Eosinophils Absolute: 0.1 10*3/uL (ref 0.0–0.7)
Eosinophils Relative: 2 % (ref 0–5)
HCT: 39.2 % (ref 36.0–46.0)
Hemoglobin: 13.4 g/dL (ref 12.0–15.0)
Lymphocytes Relative: 22 % (ref 12–46)
Lymphs Abs: 2 10*3/uL (ref 0.7–4.0)
MCH: 28.8 pg (ref 26.0–34.0)
MCHC: 34.2 g/dL (ref 30.0–36.0)
MCV: 84.3 fL (ref 78.0–100.0)
Monocytes Absolute: 0.3 10*3/uL (ref 0.1–1.0)
Monocytes Relative: 4 % (ref 3–12)
Neutro Abs: 6.3 10*3/uL (ref 1.7–7.7)
Neutrophils Relative %: 72 % (ref 43–77)
Platelets: 259 10*3/uL (ref 150–400)
RBC: 4.65 MIL/uL (ref 3.87–5.11)
RDW: 13.4 % (ref 11.5–15.5)
WBC: 8.7 10*3/uL (ref 4.0–10.5)

## 2014-12-30 LAB — COMPREHENSIVE METABOLIC PANEL
ALT: 14 U/L (ref 0–35)
AST: 24 U/L (ref 0–37)
Albumin: 3.9 g/dL (ref 3.5–5.2)
Alkaline Phosphatase: 77 U/L (ref 39–117)
Anion gap: 7 (ref 5–15)
BUN: 9 mg/dL (ref 6–23)
CO2: 23 mmol/L (ref 19–32)
Calcium: 8.9 mg/dL (ref 8.4–10.5)
Chloride: 107 mmol/L (ref 96–112)
Creatinine, Ser: 0.64 mg/dL (ref 0.50–1.10)
GFR calc Af Amer: >90 mL/min (ref >90)
GFR calc non Af Amer: >90 mL/min (ref >90)
Glucose, Bld: 87 mg/dL (ref 70–99)
Potassium: 4.1 mmol/L (ref 3.5–5.1)
Sodium: 137 mmol/L (ref 135–145)
Total Bilirubin: 1.2 mg/dL (ref 0.3–1.2)
Total Protein: 7.1 g/dL (ref 6.0–8.3)

## 2014-12-30 LAB — URINALYSIS, ROUTINE W REFLEX MICROSCOPIC
Glucose, UA: NEGATIVE mg/dL
Hgb urine dipstick: NEGATIVE
Ketones, ur: 15 mg/dL — AB
Leukocytes, UA: NEGATIVE
Nitrite: NEGATIVE
Protein, ur: NEGATIVE mg/dL
Specific Gravity, Urine: 1.035 — ABNORMAL HIGH (ref 1.005–1.030)
Urobilinogen, UA: 1 mg/dL (ref 0.0–1.0)
pH: 5.5 (ref 5.0–8.0)

## 2014-12-30 LAB — PREGNANCY, URINE: Preg Test, Ur: NEGATIVE

## 2014-12-30 MED ORDER — SODIUM CHLORIDE 0.9 % IV BOLUS (SEPSIS)
1000.0000 mL | Freq: Once | INTRAVENOUS | Status: AC
  Administered 2014-12-30: 1000 mL via INTRAVENOUS

## 2014-12-30 MED ORDER — ONDANSETRON 4 MG PO TBDP: 4.0000 mg | ORAL_TABLET | Freq: Three times a day (TID) | ORAL | Status: DC | PRN

## 2014-12-30 MED ORDER — ONDANSETRON HCL 4 MG PO TABS
8.0000 mg | ORAL_TABLET | Freq: Once | ORAL | Status: AC
Start: 2014-12-30 — End: 2014-12-30
  Administered 2014-12-30: 8 mg via ORAL
  Filled 2014-12-30: qty 2

## 2014-12-30 MED ORDER — PROMETHAZINE HCL 25 MG PO TABS: 25.0000 mg | ORAL_TABLET | Freq: Four times a day (QID) | ORAL | Status: DC | PRN

## 2014-12-30 NOTE — Discharge Instructions (Signed)
Please follow up with a local primary care doctor for further workup of your ongoing headaches and ear pain.  Take medications as prescribed.   Fatigue Fatigue is a feeling of tiredness, lack of energy, lack of motivation, or feeling tired all the time. Having enough rest, good nutrition, and reducing stress will normally reduce fatigue. Consult your caregiver if it persists. The nature of your fatigue will help your caregiver to find out its cause. The treatment is based on the cause.  CAUSES  There are many causes for fatigue. Most of the time, fatigue can be traced to one or more of your habits or routines. Most causes fit into one or more of three general areas. They are: Lifestyle problems  Sleep disturbances.  Overwork.  Physical exertion.  Unhealthy habits.  Poor eating habits or eating disorders.  Alcohol and/or drug use .  Lack of proper nutrition (malnutrition). Psychological problems  Stress and/or anxiety problems.  Depression.  Grief.  Boredom. Medical Problems or Conditions  Anemia.  Pregnancy.  Thyroid gland problems.  Recovery from major surgery.  Continuous pain.  Emphysema or asthma that is not well controlled  Allergic conditions.  Diabetes.  Infections (such as mononucleosis).  Obesity.  Sleep disorders, such as sleep apnea.  Heart failure or other heart-related problems.  Cancer.  Kidney disease.  Liver disease.  Effects of certain medicines such as antihistamines, cough and cold remedies, prescription pain medicines, heart and blood pressure medicines, drugs used for treatment of cancer, and some antidepressants. SYMPTOMS  The symptoms of fatigue include:   Lack of energy.  Lack of drive (motivation).  Drowsiness.  Feeling of indifference to the surroundings. DIAGNOSIS  The details of how you feel help guide your caregiver in finding out what is causing the fatigue. You will be asked about your present and past health  condition. It is important to review all medicines that you take, including prescription and non-prescription items. A thorough exam will be done. You will be questioned about your feelings, habits, and normal lifestyle. Your caregiver may suggest blood tests, urine tests, or other tests to look for common medical causes of fatigue.  TREATMENT  Fatigue is treated by correcting the underlying cause. For example, if you have continuous pain or depression, treating these causes will improve how you feel. Similarly, adjusting the dose of certain medicines will help in reducing fatigue.  HOME CARE INSTRUCTIONS   Try to get the required amount of good sleep every night.  Eat a healthy and nutritious diet, and drink enough water throughout the day.  Practice ways of relaxing (including yoga or meditation).  Exercise regularly.  Make plans to change situations that cause stress. Act on those plans so that stresses decrease over time. Keep your work and personal routine reasonable.  Avoid street drugs and minimize use of alcohol.  Start taking a daily multivitamin after consulting your caregiver. SEEK MEDICAL CARE IF:   You have persistent tiredness, which cannot be accounted for.  You have fever.  You have unintentional weight loss.  You have headaches.  You have disturbed sleep throughout the night.  You are feeling sad.  You have constipation.  You have dry skin.  You have gained weight.  You are taking any new or different medicines that you suspect are causing fatigue.  You are unable to sleep at night.  You develop any unusual swelling of your legs or other parts of your body. SEEK IMMEDIATE MEDICAL CARE IF:   You are  feeling confused.  Your vision is blurred.  You feel faint or pass out.  You develop severe headache.  You develop severe abdominal, pelvic, or back pain.  You develop chest pain, shortness of breath, or an irregular or fast heartbeat.  You are  unable to pass a normal amount of urine.  You develop abnormal bleeding such as bleeding from the rectum or you vomit blood.  You have thoughts about harming yourself or committing suicide.  You are worried that you might harm someone else. MAKE SURE YOU:   Understand these instructions.  Will watch your condition.  Will get help right away if you are not doing well or get worse. Document Released: 08/28/2007 Document Revised: 01/23/2012 Document Reviewed: 03/04/2014 [facility] Patient Information 2015 [place], [place]. This information is not intended to replace advice given to you by your health care provider. Make sure you discuss any questions you have with your health care provider.  Migraine Headache A migraine headache is an intense, throbbing pain on one or both sides of your head. A migraine can last for 30 minutes to several hours. CAUSES  The exact cause of a migraine headache is not always known. However, a migraine may be caused when nerves in the brain become irritated and release chemicals that cause inflammation. This causes pain. Certain things may also trigger migraines, such as:  Alcohol.  Smoking.  Stress.  Menstruation.  Aged cheeses.  Foods or drinks that contain nitrates, glutamate, aspartame, or tyramine.  Lack of sleep.  Chocolate.  Caffeine.  Hunger.  Physical exertion.  Fatigue.  Medicines used to treat chest pain (nitroglycerine), birth control pills, estrogen, and some blood pressure medicines. SIGNS AND SYMPTOMS  Pain on one or both sides of your head.  Pulsating or throbbing pain.  Severe pain that prevents daily activities.  Pain that is aggravated by any physical activity.  Nausea, vomiting, or both.  Dizziness.  Pain with exposure to bright lights, loud noises, or activity.  General sensitivity to bright lights, loud noises, or smells. Before you get a migraine, you may get warning signs that a migraine is coming (aura).  An aura may include:  Seeing flashing lights.  Seeing bright spots, halos, or zigzag lines.  Having tunnel vision or blurred vision.  Having feelings of numbness or tingling.  Having trouble talking.  Having muscle weakness. DIAGNOSIS  A migraine headache is often diagnosed based on:  Symptoms.  Physical exam.  A CT scan or MRI of your head. These imaging tests cannot diagnose migraines, but they can help rule out other causes of headaches. TREATMENT Medicines may be given for pain and nausea. Medicines can also be given to help prevent recurrent migraines.  HOME CARE INSTRUCTIONS  Only take over-the-counter or prescription medicines for pain or discomfort as directed by your health care provider. The use of long-term narcotics is not recommended.  Lie down in a dark, quiet room when you have a migraine.  Keep a journal to find out what may trigger your migraine headaches. For example, write down:  What you eat and drink.  How much sleep you get.  Any change to your diet or medicines.  Limit alcohol consumption.  Quit smoking if you smoke.  Get 7-9 hours of sleep, or as recommended by your health care provider.  Limit stress.  Keep lights dim if bright lights bother you and make your migraines worse. SEEK IMMEDIATE MEDICAL CARE IF:   Your migraine becomes severe.  You have a fever.  You have a stiff neck.  You have vision loss.  You have muscular weakness or loss of muscle control.  You start losing your balance or have trouble walking.  You feel faint or pass out.  You have severe symptoms that are different from your first symptoms. MAKE SURE YOU:   Understand these instructions.  Will watch your condition.  Will get help right away if you are not doing well or get worse. Document Released: 10/31/2005 Document Revised: 03/17/2014 Document Reviewed: 07/08/2013 [facility] Patient Information 2015 [place], [place]. This information is not intended to  replace advice given to you by your health care provider. Make sure you discuss any questions you have with your health care provider.  Nausea and Vomiting Nausea is a sick feeling that often comes before throwing up (vomiting). Vomiting is a reflex where stomach contents come out of your mouth. Vomiting can cause severe loss of body fluids (dehydration). Children and elderly adults can become dehydrated quickly, especially if they also have diarrhea. Nausea and vomiting are symptoms of a condition or disease. It is important to find the cause of your symptoms. CAUSES   Direct irritation of the stomach lining. This irritation can result from increased acid production (gastroesophageal reflux disease), infection, food poisoning, taking certain medicines (such as nonsteroidal anti-inflammatory drugs), alcohol use, or tobacco use.  Signals from the brain.These signals could be caused by a headache, heat exposure, an inner ear disturbance, increased pressure in the brain from injury, infection, a tumor, or a concussion, pain, emotional stimulus, or metabolic problems.  An obstruction in the gastrointestinal tract (bowel obstruction).  Illnesses such as diabetes, hepatitis, gallbladder problems, appendicitis, kidney problems, cancer, sepsis, atypical symptoms of a heart attack, or eating disorders.  Medical treatments such as chemotherapy and radiation.  Receiving medicine that makes you sleep (general anesthetic) during surgery. DIAGNOSIS Your caregiver may ask for tests to be done if the problems do not improve after a few days. Tests may also be done if symptoms are severe or if the reason for the nausea and vomiting is not clear. Tests may include:  Urine tests.  Blood tests.  Stool tests.  Cultures (to look for evidence of infection).  X-rays or other imaging studies. Test results can help your caregiver make decisions about treatment or the need for additional tests. TREATMENT You  need to stay well hydrated. Drink frequently but in small amounts.You may wish to drink water, sports drinks, clear broth, or eat frozen ice pops or gelatin dessert to help stay hydrated.When you eat, eating slowly may help prevent nausea.There are also some antinausea medicines that may help prevent nausea. HOME CARE INSTRUCTIONS   Take all medicine as directed by your caregiver.  If you do not have an appetite, do not force yourself to eat. However, you must continue to drink fluids.  If you have an appetite, eat a normal diet unless your caregiver tells you differently.  Eat a variety of complex carbohydrates (rice, wheat, potatoes, bread), lean meats, yogurt, fruits, and vegetables.  Avoid high-fat foods because they are more difficult to digest.  Drink enough water and fluids to keep your urine clear or pale yellow.  If you are dehydrated, ask your caregiver for specific rehydration instructions. Signs of dehydration may include:  Severe thirst.  Dry lips and mouth.  Dizziness.  Dark urine.  Decreasing urine frequency and amount.  Confusion.  Rapid breathing or pulse. SEEK IMMEDIATE MEDICAL CARE IF:   You have blood or brown flecks (  like coffee grounds) in your vomit.  You have black or bloody stools.  You have a severe headache or stiff neck.  You are confused.  You have severe abdominal pain.  You have chest pain or trouble breathing.  You do not urinate at least once every 8 hours.  You develop cold or clammy skin.  You continue to vomit for longer than 24 to 48 hours.  You have a fever. MAKE SURE YOU:   Understand these instructions.  Will watch your condition.  Will get help right away if you are not doing well or get worse. Document Released: 10/31/2005 Document Revised: 01/23/2012 Document Reviewed: 03/30/2011 [facility] Patient Information 2015 [place], [place]. This information is not intended to replace advice given to you by your health care  provider. Make sure you discuss any questions you have with your health care provider.  Weakness Weakness is a lack of strength. It may be felt all over the body (generalized) or in one specific part of the body (focal). Some causes of weakness can be serious. You may need further medical evaluation, especially if you are elderly or you have a history of immunosuppression (such as chemotherapy or HIV), kidney disease, heart disease, or diabetes. CAUSES  Weakness can be caused by many different things, including:  Infection.  Physical exhaustion.  Internal bleeding or other blood loss that results in a lack of red blood cells (anemia).  Dehydration. This cause is more common in elderly people.  Side effects or electrolyte abnormalities from medicines, such as pain medicines or sedatives.  Emotional distress, anxiety, or depression.  Circulation problems, especially severe peripheral arterial disease.  Heart disease, such as rapid atrial fibrillation, bradycardia, or heart failure.  Nervous system disorders, such as Guillain-Barr syndrome, multiple sclerosis, or stroke. DIAGNOSIS  To find the cause of your weakness, your caregiver will take your history and perform a physical exam. Lab tests or X-rays may also be ordered, if needed. TREATMENT  Treatment of weakness depends on the cause of your symptoms and can vary greatly. HOME CARE INSTRUCTIONS   Rest as needed.  Eat a well-balanced diet.  Try to get some exercise every day.  Only take over-the-counter or prescription medicines as directed by your caregiver. SEEK MEDICAL CARE IF:   Your weakness seems to be getting worse or spreads to other parts of your body.  You develop new aches or pains. SEEK IMMEDIATE MEDICAL CARE IF:   You cannot perform your normal daily activities, such as getting dressed and feeding yourself.  You cannot walk up and down stairs, or you feel exhausted when you do so.  You have shortness of  breath or chest pain.  You have difficulty moving parts of your body.  You have weakness in only one area of the body or on only one side of the body.  You have a fever.  You have trouble speaking or swallowing.  You cannot control your bladder or bowel movements.  You have black or bloody vomit or stools. MAKE SURE YOU:  Understand these instructions.  Will watch your condition.  Will get help right away if you are not doing well or get worse. Document Released: 10/31/2005 Document Revised: 05/01/2012 Document Reviewed: 12/30/2011 [facility] Patient Information 2015 [place], [place]. This information is not intended to replace advice given to you by your health care provider. Make sure you discuss any questions you have with your health care provider.

## 2014-12-30 NOTE — ED Notes (Signed)
Pt a/o x 4 on d/c with steady gait. 

## 2014-12-30 NOTE — ED Notes (Signed)
Taxi voucher given 

## 2014-12-30 NOTE — ED Notes (Signed)
The patient's children are at the bedside.

## 2015-01-01 ENCOUNTER — Encounter (HOSPITAL_COMMUNITY): Payer: Self-pay | Admitting: *Deleted

## 2015-01-01 ENCOUNTER — Emergency Department (HOSPITAL_COMMUNITY)
Admission: EM | Admit: 2015-01-01 | Discharge: 2015-01-01 | Disposition: A | Discharge status: Home / Self Care | Payer: Medicaid Other | Attending: Emergency Medicine | Admitting: Emergency Medicine

## 2015-01-01 DIAGNOSIS — F419 Anxiety disorder, unspecified: Secondary | ICD-10-CM | POA: Diagnosis not present

## 2015-01-01 DIAGNOSIS — Z8739 Personal history of other diseases of the musculoskeletal system and connective tissue: Secondary | ICD-10-CM | POA: Diagnosis not present

## 2015-01-01 DIAGNOSIS — G43109 Migraine with aura, not intractable, without status migrainosus: Secondary | ICD-10-CM | POA: Insufficient documentation

## 2015-01-01 DIAGNOSIS — Z8751 Personal history of pre-term labor: Secondary | ICD-10-CM | POA: Diagnosis not present

## 2015-01-01 DIAGNOSIS — J45909 Unspecified asthma, uncomplicated: Secondary | ICD-10-CM | POA: Diagnosis not present

## 2015-01-01 DIAGNOSIS — Z79899 Other long term (current) drug therapy: Secondary | ICD-10-CM | POA: Insufficient documentation

## 2015-01-01 DIAGNOSIS — R51 Headache: Secondary | ICD-10-CM | POA: Diagnosis present

## 2015-01-01 DIAGNOSIS — F329 Major depressive disorder, single episode, unspecified: Secondary | ICD-10-CM | POA: Insufficient documentation

## 2015-01-01 DIAGNOSIS — Z720 Tobacco use: Secondary | ICD-10-CM | POA: Diagnosis not present

## 2015-01-01 MED ORDER — HYDROMORPHONE HCL 1 MG/ML IJ SOLN
1.0000 mg | Freq: Once | INTRAMUSCULAR | Status: AC
  Administered 2015-01-01: 1 mg via INTRAVENOUS
  Filled 2015-01-01: qty 1

## 2015-01-01 MED ORDER — KETOROLAC TROMETHAMINE 30 MG/ML IJ SOLN
30.0000 mg | Freq: Once | INTRAMUSCULAR | Status: DC
  Administered 2015-01-01: 30 mg via INTRAVENOUS
  Filled 2015-01-01: qty 1

## 2015-01-01 MED ORDER — SODIUM CHLORIDE 0.9 % IV BOLUS (SEPSIS)
1000.0000 mL | Freq: Once | INTRAVENOUS | Status: AC
  Administered 2015-01-01: 1000 mL via INTRAVENOUS

## 2015-01-01 MED ORDER — ONDANSETRON 8 MG PO TBDP: 8.0000 mg | ORAL_TABLET | Freq: Three times a day (TID) | ORAL | Status: DC | PRN

## 2015-01-01 MED ORDER — METOCLOPRAMIDE HCL 5 MG/ML IJ SOLN
10.0000 mg | Freq: Once | INTRAMUSCULAR | Status: AC
  Administered 2015-01-01: 10 mg via INTRAVENOUS
  Filled 2015-01-01: qty 2

## 2015-01-01 MED ORDER — DIPHENHYDRAMINE HCL 50 MG/ML IJ SOLN
25.0000 mg | Freq: Once | INTRAMUSCULAR | Status: DC
  Filled 2015-01-01: qty 1

## 2015-01-01 MED ORDER — DEXAMETHASONE SODIUM PHOSPHATE 10 MG/ML IJ SOLN
10.0000 mg | Freq: Once | INTRAMUSCULAR | Status: DC
  Filled 2015-01-01: qty 1

## 2015-01-01 NOTE — ED Provider Notes (Signed)
Complains of frontal headache gradual onset 3 days ago. Feels like migraine she's had in the past. Associated symptoms include nausea vomiting, photophobia. On exam appears well and comfortable Glasgow Coma Score 15 HEENT exam no facial asymmetry neck supple trachea midline . Neurologic Glasgow Coma Score 15 cranial nerves II through XII grossly intact gait normal Romberg normal pronator drift normal finger to nose normal  Orlie Dakin, MD 01/01/15 1121

## 2015-01-01 NOTE — ED Notes (Addendum)
Patient states she has had a headache x3 days.  Patient states also c/o nausea/vomiting.  Patient denies diarrhea and fever.  Patient states, "I really think it's something in my house."  Patient states she feels better when she leaves her home.  Patient states she feels as though she can't breathe at home.  Patient heats home with gas and states carbon monoxide sensors are in place at home.  Patient has nasal congestion, but states she does not believe her head pain is related.  Patient in NAD, MAE.  Patient was seen in [facility] ED for same complaints on Monday of this week.

## 2015-01-01 NOTE — Discharge Instructions (Signed)
You are most likely having a migraine headache. Continue drinking plenty of water and get enough sleep, tylenol 500mg  or 650mg  every 4-6 hours (can take 1000mg  dose once a day for the beginning of headache, but never more than 400mg  in 24hrs), ibuprofen (up to 600-800mg  every 4-6 hours), or excedrin migraine (don't combine all 3, but can alternate). You should establish with a primary doctor to discuss management of your headaches in the future and if they become more frequent can determine if you need to any daily medication to prevent them.  It is not that likely that your apartment is having a carbon monoxide leak, but if you are still concerned you should call your landlord, [place] to evaluate your apartment. You should also always have your own personal carbon monoxide detector plugged in at the apartment, best to be placed in your bedroom.  Migraine Headache A migraine headache is an intense, throbbing pain on one or both sides of your head. A migraine can last for 30 minutes to several hours. CAUSES  The exact cause of a migraine headache is not always known. However, a migraine may be caused when nerves in the brain become irritated and release chemicals that cause inflammation. This causes pain. Certain things may also trigger migraines, such as:  Alcohol.  Smoking.  Stress.  Menstruation.  Aged cheeses.  Foods or drinks that contain nitrates, glutamate, aspartame, or tyramine.  Lack of sleep.  Chocolate.  Caffeine.  Hunger.  Physical exertion.  Fatigue.  Medicines used to treat chest pain (nitroglycerine), birth control pills, estrogen, and some blood pressure medicines. SIGNS AND SYMPTOMS  Pain on one or both sides of your head.  Pulsating or throbbing pain.  Severe pain that prevents daily activities.  Pain that is aggravated by any physical activity.  Nausea, vomiting, or both.  Dizziness.  Pain with exposure to bright lights, loud noises, or  activity.  General sensitivity to bright lights, loud noises, or smells. Before you get a migraine, you may get warning signs that a migraine is coming (aura). An aura may include:  Seeing flashing lights.  Seeing bright spots, halos, or zigzag lines.  Having tunnel vision or blurred vision.  Having feelings of numbness or tingling.  Having trouble talking.  Having muscle weakness. DIAGNOSIS  A migraine headache is often diagnosed based on:  Symptoms.  Physical exam.  A CT scan or MRI of your head. These imaging tests cannot diagnose migraines, but they can help rule out other causes of headaches. TREATMENT Medicines may be given for pain and nausea. Medicines can also be given to help prevent recurrent migraines.  HOME CARE INSTRUCTIONS  Only take over-the-counter or prescription medicines for pain or discomfort as directed by your health care provider. The use of long-term narcotics is not recommended.  Lie down in a dark, quiet room when you have a migraine.  Keep a journal to find out what may trigger your migraine headaches. For example, write down:  What you eat and drink.  How much sleep you get.  Any change to your diet or medicines.  Limit alcohol consumption.  Quit smoking if you smoke.  Get 7-9 hours of sleep, or as recommended by your health care provider.  Limit stress.  Keep lights dim if bright lights bother you and make your migraines worse. SEEK IMMEDIATE MEDICAL CARE IF:   Your migraine becomes severe.  You have a fever.  You have a stiff neck.  You have vision loss.  You have muscular weakness or loss of muscle control.  You start losing your balance or have trouble walking.  You feel faint or pass out.  You have severe symptoms that are different from your first symptoms. MAKE SURE YOU:   Understand these instructions.  Will watch your condition.  Will get help right away if you are not doing well or get worse. Document  Released: 10/31/2005 Document Revised: 03/17/2014 Document Reviewed: 07/08/2013 [facility] Patient Information 2015 [place], [place]. This information is not intended to replace advice given to you by your health care provider. Make sure you discuss any questions you have with your health care provider.
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
## 2015-01-01 NOTE — ED Notes (Addendum)
Patient presents to ED with c/o headache with vomiting since this past Monday (2/15).  Patient has history of same and was seen at [facility] ED on Monday for same.  Patient has nasal congestion, but states pain is in her head, not her face.  Patient c/o photophobia and states standing makes head pain worse.  Patient denies diarrhea and fever.  Patient denies changes in vision.  On exam, lung sounds clear bilaterally.  Heart sounds S1/S2, no murmur.  +2 radial and pedal pulses palpated.  No pre-tibial edema noted.  Bowel sounds hypoactive, abdomen soft and non-tender to palpation.  PERRL, MAE.  EOM intact. Patient denies abdominal pain.  Her only complaint of pain is in frontal and temporal areas of head.

## 2015-01-03 ENCOUNTER — Encounter (HOSPITAL_COMMUNITY): Payer: Self-pay | Admitting: Adult Health

## 2015-01-03 ENCOUNTER — Emergency Department (HOSPITAL_COMMUNITY)
Admission: EM | Admit: 2015-01-03 | Discharge: 2015-01-04 | Disposition: A | Discharge status: Home / Self Care | Payer: Medicaid Other | Attending: Emergency Medicine | Admitting: Emergency Medicine

## 2015-01-03 DIAGNOSIS — G43909 Migraine, unspecified, not intractable, without status migrainosus: Secondary | ICD-10-CM | POA: Insufficient documentation

## 2015-01-03 DIAGNOSIS — Z79899 Other long term (current) drug therapy: Secondary | ICD-10-CM | POA: Insufficient documentation

## 2015-01-03 DIAGNOSIS — Z8659 Personal history of other mental and behavioral disorders: Secondary | ICD-10-CM | POA: Insufficient documentation

## 2015-01-03 DIAGNOSIS — R059 Cough, unspecified: Secondary | ICD-10-CM

## 2015-01-03 DIAGNOSIS — R05 Cough: Secondary | ICD-10-CM

## 2015-01-03 DIAGNOSIS — Z792 Long term (current) use of antibiotics: Secondary | ICD-10-CM | POA: Insufficient documentation

## 2015-01-03 DIAGNOSIS — J069 Acute upper respiratory infection, unspecified: Secondary | ICD-10-CM | POA: Insufficient documentation

## 2015-01-03 DIAGNOSIS — M545 Low back pain, unspecified: Secondary | ICD-10-CM

## 2015-01-03 DIAGNOSIS — J45909 Unspecified asthma, uncomplicated: Secondary | ICD-10-CM | POA: Diagnosis not present

## 2015-01-03 DIAGNOSIS — H9209 Otalgia, unspecified ear: Secondary | ICD-10-CM | POA: Insufficient documentation

## 2015-01-03 DIAGNOSIS — M419 Scoliosis, unspecified: Secondary | ICD-10-CM | POA: Insufficient documentation

## 2015-01-03 DIAGNOSIS — Z7951 Long term (current) use of inhaled steroids: Secondary | ICD-10-CM | POA: Insufficient documentation

## 2015-01-03 DIAGNOSIS — Z720 Tobacco use: Secondary | ICD-10-CM | POA: Insufficient documentation

## 2015-01-03 DIAGNOSIS — Z7952 Long term (current) use of systemic steroids: Secondary | ICD-10-CM | POA: Diagnosis not present

## 2015-01-03 DIAGNOSIS — B9789 Other viral agents as the cause of diseases classified elsewhere: Secondary | ICD-10-CM

## 2015-01-03 LAB — URINE MICROSCOPIC-ADD ON

## 2015-01-03 LAB — URINALYSIS, ROUTINE W REFLEX MICROSCOPIC
Bilirubin Urine: NEGATIVE
Glucose, UA: NEGATIVE mg/dL
Ketones, ur: NEGATIVE mg/dL
Leukocytes, UA: NEGATIVE
Nitrite: NEGATIVE
Protein, ur: NEGATIVE mg/dL
Specific Gravity, Urine: 1.029 (ref 1.005–1.030)
Urobilinogen, UA: 1 mg/dL (ref 0.0–1.0)
pH: 5.5 (ref 5.0–8.0)

## 2015-01-03 MED ORDER — HYDROCOD POLST-CHLORPHEN POLST 10-8 MG/5ML PO LQCR
5.0000 mL | Freq: Two times a day (BID) | ORAL | Status: DC | PRN
Start: 2015-01-03 — End: 2015-01-05

## 2015-01-03 MED ORDER — IBUPROFEN 400 MG PO TABS
800.0000 mg | ORAL_TABLET | Freq: Once | ORAL | Status: AC
  Administered 2015-01-03: 800 mg via ORAL
  Filled 2015-01-03: qty 2

## 2015-01-03 MED ORDER — LIDOCAINE VISCOUS 2 % MT SOLN
15.0000 mL | Freq: Once | OROMUCOSAL | Status: AC
  Administered 2015-01-03: 15 mL via OROMUCOSAL
  Filled 2015-01-03: qty 15

## 2015-01-03 MED ORDER — FLUTICASONE PROPIONATE 50 MCG/ACT NA SUSP: 2.0000 | Freq: Every day | NASAL | Status: DC

## 2015-01-03 MED ORDER — IBUPROFEN 800 MG PO TABS: 800.0000 mg | ORAL_TABLET | Freq: Three times a day (TID) | ORAL | Status: DC

## 2015-01-03 MED ORDER — ALBUTEROL SULFATE HFA 108 (90 BASE) MCG/ACT IN AERS
2.0000 | INHALATION_SPRAY | Freq: Once | RESPIRATORY_TRACT | Status: AC
  Administered 2015-01-03: 2 via RESPIRATORY_TRACT
  Filled 2015-01-03: qty 6.7

## 2015-01-03 MED ORDER — LIDOCAINE VISCOUS 2 % MT SOLN: 20.0000 mL | OROMUCOSAL | Status: DC | PRN

## 2015-01-03 MED ORDER — CETIRIZINE HCL 10 MG PO CAPS: 10.0000 mg | ORAL_CAPSULE | Freq: Every day | ORAL | Status: DC

## 2015-01-03 NOTE — ED Notes (Signed)
Presents with nasal congestion, cough and sore thrat. Began this week. Endorses back pain-this her 3rd visit this week.

## 2015-01-03 NOTE — Discharge Instructions (Signed)
Upper Respiratory Infection, Adult An upper respiratory infection (URI) is also known as the common cold. It is often caused by a type of germ (virus). Colds are easily spread (contagious). You can pass it to others by kissing, coughing, sneezing, or drinking out of the same glass. Usually, you get better in 1 or 2 weeks.  HOME CARE   Only take medicine as told by your doctor.  Use a warm mist humidifier or breathe in steam from a hot shower.  Drink enough water and fluids to keep your pee (urine) clear or pale yellow.  Get plenty of rest.  Return to work when your temperature is back to normal or as told by your doctor. You may use a face mask and wash your hands to stop your cold from spreading. GET HELP RIGHT AWAY IF:   After the first few days, you feel you are getting worse.  You have questions about your medicine.  You have chills, shortness of breath, or brown or red spit (mucus).  You have yellow or brown snot (nasal discharge) or pain in the face, especially when you bend forward.  You have a fever, puffy (swollen) neck, pain when you swallow, or white spots in the back of your throat.  You have a bad headache, ear pain, sinus pain, or chest pain.  You have a high-pitched whistling sound when you breathe in and out (wheezing).  You have a lasting cough or cough up blood.  You have sore muscles or a stiff neck. MAKE SURE YOU:   Understand these instructions.  Will watch your condition.  Will get help right away if you are not doing well or get worse. Document Released: 04/18/2008 Document Revised: 01/23/2012 Document Reviewed: 02/05/2014 [facility] Patient Information 2015 [place], [place]. This information is not intended to replace advice given to you by your health care provider. Make sure you discuss any questions you have with your health care provider.   Emergency Department Resource Guide 1) Find a Doctor and Pay Out of Pocket Although you won't have to find  out who is covered by your insurance plan, it is a good idea to ask around and get recommendations. You will then need to call the office and see if the doctor you have chosen will accept you as a new patient and what types of options they offer for patients who are self-pay. Some doctors offer discounts or will set up payment plans for their patients who do not have insurance, but you will need to ask so you aren't surprised when you get to your appointment.  2) Contact Your Local Health Department Not all health departments have doctors that can see patients for sick visits, but many do, so it is worth a call to see if yours does. If you don't know where your local health department is, you can check in your phone book. The CDC also has a tool to help you locate your state's health department, and many state websites also have listings of all of their local health departments.  3) Find a [facility] If your illness is not likely to be very severe or complicated, you may want to try a walk in clinic. These are popping up all over the country in pharmacies, drugstores, and shopping centers. They're usually staffed by nurse practitioners or physician assistants that have been trained to treat common illnesses and complaints. They're usually fairly quick and inexpensive. However, if you have serious medical issues or chronic medical problems, these  are probably not your best option.  No Primary Care Doctor: - Call Health Connect at  [phone] - they can help you locate a primary care doctor that  accepts your insurance, provides certain services, etc. - Physician Referral Service- [phone]  Chronic Pain Problems: Organization         Address  Phone   Notes  [facility]  [phone] Patients need to be referred by their primary care doctor.   Medication Assistance: Organization         Address  Phone   Notes  [facility] Medication [facility] [address] [place], [place] [address] [phone] --Must be a resident of [facility] -- Must have NO insurance coverage whatsoever (no Medicaid/ Medicare, etc.) -- The pt. MUST have a primary care doctor that directs their care regularly and follows them in the community   MedAssist  [phone]   [employer]  [phone]    Agencies that provide inexpensive medical care: Organization         Address  Phone   Notes  [place]  [phone]   [facility]    [phone]   [facility] [place], [place] [address] [phone]   [place] [address], [place] [phone]   Planned Parenthood    [phone]   [facility]    [phone]   [place] and [place] [address], [place] Phone:  [phone], Fax:  [phone] Hours of Operation:  9 am - 6 pm, M-F.  Also accepts Medicaid/Medicare and self-pay.  [facility] for [place], [address], [place] Phone: [phone], Fax: [phone]. Hours of Operation:  8:30 am - 5:30 pm, M-F.  Also accepts Medicaid and self-pay.  [facility] [place] [address], [place] Phone: [phone]   [place], [place], [place] [phone], Ext. [phone] Mondays & Thursdays: 7-9 AM.  First 15 patients are seen on a first come, first serve basis.    [place] Providers:  Organization         Address  Phone   Notes  [facility] [address], [place] [phone] Also accepts self-pay patients.  [facility] [address], [place]  [phone]   [place], [address], [place] [phone]   [facility] [address] [place] [phone]   Lucianne Lei  [address], [place]   [phone] Only accepts [place] Access [place] patients after they have their name applied to their card.   Self-Pay (no insurance) in [facility]:  Organization         Address  Phone   Notes  Sickle Cell Patients, [facility] Internal Medicine [place] [phone]   [facility] [place] [phone]   [facility] [place]  [place], [place], [place] [phone]   [facility]/Dr. Osei-Bonsu  [address] [place] or [address], [place] [phone] Phone number for both [place] and [place] locations is the same.  [facility]  [facility] [address], [place] [phone]   [facility] [address] [place] or [address] [phone]   [facility] [address], [place] [phone], phone; [phone], fax Sees patients 1st and 3rd Saturday of every month.  Must not qualify for public or private insurance (i.e. Medicaid, Medicare, [place] Health Choice, Veterans' Benefits)  Household income should be no more than 200% of the poverty level The clinic cannot treat you if you are pregnant or think you are pregnant  Sexually transmitted diseases are not treated at the clinic.    Dental Care: Organization         Address  Phone  Notes  [facility] of [facility] [place] [phone] Accepts children up to age 5 who are enrolled in [place] or [place]; pregnant women with a Medicaid card; and children who have applied for Medicaid or [place] Health Choice, but were declined, whose parents can pay a reduced fee at time of service.  [facility]  [address], [place] [phone] Accepts children up to age 76  who are enrolled in [place] or [place]; pregnant women with a Medicaid card; and children who have applied for Medicaid or [place] Health Choice, but were declined, whose parents can pay a reduced fee at time of service.  [place] Adult Dental Access PROGRAM  Rule [phone] Patients are seen by appointment only. Walk-ins are not accepted. [place] will see patients 15 years of age and older. Monday - Tuesday (8am-5pm) Most Wednesdays (8:30-5pm) $30 per visit, cash only  [facility] Adult Dental Access PROGRAM  [address], [facility] [phone] Patients are seen by appointment only. Walk-ins are not accepted. [place] will see patients 46 years of age and older. One Wednesday Evening (Monthly: Volunteer Based).  $30 per visit, cash only  [place]  [phone] for adults; Children under age 73, call Graduate Pediatric Dentistry at [phone]. Children aged 37-14, please call [phone] to request a pediatric application.  Dental services are provided in all areas of dental care including fillings, crowns and bridges, complete and partial dentures, implants, gum treatment, root canals, and extractions. Preventive care is also provided. Treatment is provided to both adults and children. Patients are selected via a lottery and there is often a waiting list.   [facility] [address] [place]  [phone] [email or web address]   [facility] [address] [place], [place] [phone], Ext. [phone] Second and Fourth Thursday of each month, opens at 6:30 AM; Clinic ends at 9 AM.  Patients are seen on a first-come first-served basis, and a limited number are seen during each clinic.   [facility]  [address] Hillard Danker [place], [place] [phone]   Eligibility Requirements You must have lived in [place], [place], or [place] counties for at least the last three months.    You cannot be eligible for state or federal sponsored [employer], including [employer], [place], or [employer].   You generally cannot be eligible for healthcare insurance through your employer.    How to apply: Eligibility screenings are held every Tuesday and Wednesday afternoon from 1:00 pm until 4:00 pm. You do not need an appointment for the interview!  [facility]  Clinic [address] [place], [place]   [place]  [place] Department  [place]  [phone]    Behavioral Health Resources in the Community: Intensive Outpatient Programs Organization         Address  Phone  Notes  [place]. [address] [place], [place] [phone]   [facility] Outpatient [address] [place], [place]   ADS: Alcohol & Drug Svcs [address] [place], [place]   [place] [address],  [place], [place] or [phone]   Substance Abuse Resources Organization         Address  Phone  Notes  Alcohol and Drug Services  [phone]   [place]  [phone]   The [place]   Chinita Pester  [phone]   Residential & Outpatient Substance Abuse Program  [phone]   Psychological Services Organization         Address  Phone  Notes  [facility] [place]  [place]  [phone]   [place] [address] [place] or [phone]    Mobile Crisis Teams Organization         Address  Phone  Notes  [facility]atives, Mobile Crisis Care Unit  [phone]   [facility]  [address] [place], [place]   [place] [address] [place] [phone]    Self-Help/Support  Groups Organization         Address  Phone             Notes  [place]. of [place] - variety of support groups  [place] [facility] Call for more information  Narcotics Anonymous (NA), [facility]aring Services [address], [employer] [place]  2 meetings at this location   Special educational needs teacher         Address  Phone  Notes  ASAP Residential Treatment [place],    [place]  [phone]   [facility]  [address], [place] [id number], [place], [place]   [place], [place] [phone] Admissions: 8am-3pm M-F  Incentives Substance [place] [address],    [place], [place] [phone]   [facility] [address] [place], [place], [place]   [facility] [address]  [place], [place]   [facility]ight Programs - Intensive Outpatient [address] Kristeen [address], [place], [place]   [facility].,  [place], [place] [phone] or [phone]   [facility]idential Treatment Services (RTS) [address], [place], [place] Accepts Medicaid  [facility] [place] [address]  [place] [phone] Substance Abuse/Addiction Treatment   [facility] Organization         Address  Phone  Notes  [facility]  [phone]   Domenic Schwab, PhD [address] Arlis Porta [place], [place]   [phone] or [phone]   [place], [place] [phone]   [place] [address], [place], [place] [phone] Insurance/Medicaid/sponsorship through [employer] and Families [address], OIB [address]  [place], [place] [phone] [place], [place] [phone]    Dr. Adele Schilder  [phone]   [facility] Dept. 1) [address], [place] 2) [place] 3)  [place] [address], [place] [phone]  [phone]   [place] [phone] or [phone] (After Hours)

## 2015-01-03 NOTE — ED Notes (Signed)
Pt c/o congestion and cold-like symptoms x 1 week; has been seen for the same recently. Pt also reports low back pain

## 2015-01-03 NOTE — ED Provider Notes (Signed)
CSN: [id number]     Arrival date & time 01/03/15  2247 History  This chart was scribed for a non-physician practitioner, Starlyn Skeans, PA-C working with Evelina Bucy, MD by Martinique Peace, ED Scribe. The patient was seen in TR11C/TR11C. The patient's care was started at 11:04 PM.    Chief Complaint  Patient presents with  . URI      Patient is a 34 y.o. female presenting with URI. The history is provided by the patient. No language interpreter was used.  URI Presenting symptoms: congestion, cough, ear pain and sore throat   Presenting symptoms: no fever     HPI Comments: Penny [name] is a 34 y.o. female who presents to the Emergency Department complaining of nasal congestion, chills, ear pain, cough, and sore throat onset this week. Pt also complains of back pain. Pt does not currently have PCP. No complaints of fever or ear discharge. She notes she has tried taking Cold and Flu medication without relief. She adds that she just got over a stomach bug 2 days before current symptoms started. Pt is current everyday smoker.    Past Medical History  Diagnosis Date  . Preterm labor     c/s at 36 wks  . Placenta previa     hx and with current pregnancy  . Migraines     last one over 1 month ago  . Anxiety     no meds  . Depression     no meds  . Asthma   . ADHD (attention deficit hyperactivity disorder)   . Scoliosis    Past Surgical History  Procedure Laterality Date  . Cesarean section  2010    x 1 at 36 wks in [place]  . Svd  2003    x 1 in [place]  . Wisdom tooth extraction    . Tubal ligation     Family History  Problem Relation Age of Onset  . Cancer Mother   . Hypertension Sister   . Sickle cell trait Daughter   . Asthma Son   . Asthma Paternal Grandmother   . Pseudochol deficiency Neg Hx   . Malignant hyperthermia Neg Hx   . Anesthesia problems Neg Hx   . Hypotension Neg Hx    History  Substance Use Topics  . Smoking status: Current Every Day Smoker --  0.20 packs/day for 12 years    Types: Cigarettes  . Smokeless tobacco: Never Used  . Alcohol Use: Yes     Comment: on occ   OB History    Gravida Para Term Preterm AB TAB SAB Ectopic Multiple Living   3 3 1 2  0 0 0 0 0 3     Review of Systems  Constitutional: Positive for chills. Negative for fever.  HENT: Positive for congestion, ear pain and sore throat. Negative for ear discharge.   Respiratory: Positive for cough.   Gastrointestinal: Negative for abdominal pain.  Genitourinary: Positive for frequency. Negative for urgency.  Musculoskeletal: Positive for back pain.  All other systems reviewed and are negative.     Allergies  Review of patient's allergies indicates no known allergies.  Home Medications   Prior to Admission medications   Medication Sig Start Date End Date Taking? Authorizing Provider  amoxicillin-clavulanate (AUGMENTIN) 875-125 MG per tablet Take 1 tablet by mouth 2 (two) times daily. Patient not taking: Reported on 11/24/2014 10/07/14   Billy Fischer, MD  aspirin-acetaminophen-caffeine [facility] MIGRAINE) [phone] MG per tablet Take 2 tablets by mouth  every 6 (six) hours as needed for headache.    Historical Provider, MD  benzonatate (TESSALON) 100 MG capsule Take 1 capsule (100 mg total) by mouth 3 (three) times daily as needed for cough. Patient not taking: Reported on 11/28/2014 10/06/14   Francine Graven, DO  Cetirizine HCl 10 MG CAPS Take 1 capsule (10 mg total) by mouth at bedtime. 01/03/15   Nelsy Madonna A Forcucci, PA-C  chlorpheniramine-HYDROcodone (TUSSIONEX PENNKINETIC ER) 10-8 MG/5ML LQCR Take 5 mLs by mouth every 12 (twelve) hours as needed for cough. 01/03/15   Kardell Virgil A Forcucci, PA-C  clotrimazole (LOTRIMIN) 1 % cream Apply to affected area 2 times daily Patient not taking: Reported on 12/30/2014 11/28/14   Nehemiah Settle A Upstill, PA-C  dexamethasone (DECADRON) 4 MG tablet Take 1 tablet (4 mg total) by mouth 2 (two) times daily. Patient not taking:  Reported on 10/08/2014 09/15/14   Virgel Manifold, MD  fluticasone [facility]) 50 MCG/ACT nasal spray Place 2 sprays into both nostrils daily. 01/03/15   Mandela Bello A Forcucci, PA-C  gabapentin (NEURONTIN) 300 MG capsule Take 1 capsule (300 mg total) by mouth 3 (three) times daily. Patient not taking: Reported on 11/24/2014 10/07/14   Billy Fischer, MD  guaiFENesin-codeine 100-10 MG/5ML syrup Take 5 mLs by mouth 3 (three) times daily as needed for cough. Patient not taking: Reported on 11/28/2014 09/19/14   Hyman Bible, PA-C  HYDROcodone-acetaminophen (NORCO/VICODIN) 5-325 MG per tablet Take 2 tablets by mouth every 4 (four) hours as needed for moderate pain or severe pain. Patient not taking: Reported on 01/01/2015 11/29/14   Alvina Chou, PA-C  ibuprofen (ADVIL,MOTRIN) 800 MG tablet Take 1 tablet (800 mg total) by mouth 3 (three) times daily. 01/03/15   Taelyn Broecker A Forcucci, PA-C  levonorgestrel (MIRENA) 20 MCG/24HR IUD 1 each by Intrauterine route continuous.     Historical Provider, MD  lidocaine (XYLOCAINE) 2 % solution Use as directed 20 mLs in the mouth or throat as needed for mouth pain. 01/03/15   Natausha Jungwirth A Forcucci, PA-C  omeprazole (PRILOSEC) 20 MG capsule Take 1 capsule (20 mg total) by mouth daily. Patient not taking: Reported on 11/28/2014 11/24/14   Hoy Morn, MD  ondansetron (ZOFRAN ODT) 4 MG disintegrating tablet Take 1 tablet (4 mg total) by mouth every 8 (eight) hours as needed for nausea or vomiting. Patient not taking: Reported on 01/01/2015 12/30/14   [name], MD  oxyCODONE-acetaminophen (PERCOCET/ROXICET) 5-325 MG per tablet 1 or 2 tabs PO q6h prn pain Patient not taking: Reported on 11/28/2014 10/06/14   Francine Graven, DO  promethazine (PHENERGAN) 25 MG tablet Take 1 tablet (25 mg total) by mouth every 6 (six) hours as needed for nausea or vomiting. Patient not taking: Reported on 01/01/2015 12/30/14   [name], MD   BP 130/86 mmHg  Pulse 83  Temp(Src) 98.4 F (36.9  C) (Oral)  Resp 18  Ht 5\' 7"  (1.702 m)  Wt 180 lb (81.647 kg)  BMI 28.19 kg/m2  SpO2 99%  LMP 11/03/2014 Physical Exam  Constitutional: She is oriented to person, place, and time. She appears well-developed and well-nourished. No distress.  HENT:  Head: Normocephalic and atraumatic.  Right Ear: Tympanic membrane normal.  Left Ear: Tympanic membrane normal.  Nose: Mucosal edema present.  Mouth/Throat: Uvula is midline. No trismus in the jaw. No posterior oropharyngeal edema or posterior oropharyngeal erythema.  Eyes: Conjunctivae and EOM are normal. Pupils are equal, round, and reactive to light. No scleral icterus.  Neck: Normal  range of motion. Neck supple. No tracheal deviation present.  Cardiovascular: Normal rate, regular rhythm, normal heart sounds and intact distal pulses.  Exam reveals no gallop and no friction rub.   No murmur heard. Pulmonary/Chest: Effort normal and breath sounds normal. No respiratory distress. She has no wheezes. She has no rales. She exhibits tenderness.  Abdominal: Soft. Bowel sounds are normal. She exhibits no distension and no mass. There is no tenderness. There is no rebound and no guarding.  Musculoskeletal: Normal range of motion.       Cervical back: Normal.       Thoracic back: Normal.       Lumbar back: She exhibits tenderness and pain. She exhibits normal range of motion, no bony tenderness, no swelling, no edema, no deformity, no laceration, no spasm and normal pulse.  Neurological: She is alert and oriented to person, place, and time. She has normal strength. No cranial nerve deficit or sensory deficit. Coordination normal.  Skin: Skin is warm and dry.  Psychiatric: She has a normal mood and affect. Her behavior is normal. Judgment and thought content normal.  Nursing note and vitals reviewed.   ED Course  Procedures (including critical care time) Labs Review Labs Reviewed  URINALYSIS, ROUTINE W REFLEX MICROSCOPIC - Abnormal; Notable for  the following:    Hgb urine dipstick MODERATE (*)    All other components within normal limits  URINE MICROSCOPIC-ADD ON - Abnormal; Notable for the following:    Squamous Epithelial / LPF FEW (*)    Bacteria, UA FEW (*)    Crystals CA OXALATE CRYSTALS (*)    All other components within normal limits    Imaging Review No results found.   EKG Interpretation None     Medications  lidocaine (XYLOCAINE) 2 % viscous mouth solution 15 mL (15 mLs Mouth/Throat Given 01/03/15 2347)  ibuprofen (ADVIL,MOTRIN) tablet 800 mg (800 mg Oral Given 01/03/15 2346)  albuterol (PROVENTIL HFA;VENTOLIN HFA) 108 (90 BASE) MCG/ACT inhaler 2 puff (2 puffs Inhalation Given 01/03/15 2345)    11:14 PM- Treatment plan was discussed with patient who verbalizes understanding and agrees.   MDM   Final diagnoses:  Cough  Viral upper respiratory tract infection with cough  Bilateral low back pain without sciatica   Patient is a 34 year old female who presents emergency room for evaluation of congestion, cough, sore throat, malaise. Physical exam reveals lungs are clear to auscultation with no wheezes rhonchi or rales. Patient's vital signs are stable and she is afebrile. Patient does have mucosal edema and congestion. There are no focal neurological deficits. No evidence for cauda equina. Suspect that this is likely muscle strain. We'll discharge the patient home with symptomatic therapy including albuterol inhaler, Tussionex, nasal saline, Flonase, Zyrtec, and ibuprofen. Patient to return for worsening shortness breath, chest pain, wheezing, or any other concerning symptoms. She states understanding and agreement at this time. UA is negative. Instructed the patient to take ibuprofen 3 times daily for back pain. We'll discharge patient with a Futures trader. She can follow up with PCP of her choosing.  I personally performed the services described in this documentation, which was scribed in my presence. The recorded  information has been reviewed and is accurate.    Cherylann Parr, PA-C 01/03/15 2358  Evelina Bucy, MD 01/04/15 [phone]

## 2015-01-05 ENCOUNTER — Emergency Department (INDEPENDENT_AMBULATORY_CARE_PROVIDER_SITE_OTHER): Payer: Medicaid Other

## 2015-01-05 ENCOUNTER — Emergency Department (INDEPENDENT_AMBULATORY_CARE_PROVIDER_SITE_OTHER)
Admission: EM | Admit: 2015-01-05 | Discharge: 2015-01-05 | Disposition: A | Discharge status: Home / Self Care | Payer: Medicaid Other | Source: Home / Self Care | Attending: Emergency Medicine | Admitting: Emergency Medicine

## 2015-01-05 ENCOUNTER — Encounter (HOSPITAL_COMMUNITY): Payer: Self-pay

## 2015-01-05 DIAGNOSIS — J00 Acute nasopharyngitis [common cold]: Secondary | ICD-10-CM

## 2015-01-05 MED ORDER — BENZONATATE 100 MG PO CAPS: 100.0000 mg | ORAL_CAPSULE | Freq: Three times a day (TID) | ORAL | Status: DC | PRN

## 2015-01-05 MED ORDER — IPRATROPIUM BROMIDE 0.06 % NA SOLN: 2.0000 | Freq: Four times a day (QID) | NASAL | Status: DC

## 2015-01-05 NOTE — Discharge Instructions (Signed)
Chest xray normal. No pneumonia. No indication for antibiotics. Expect improvement over the next 5-7 days Symptomatic care, fluids, tylenol and rest at home Tessalon for cough Atrovent for nasal congestion  Primary care  follow up if no improvement.  Upper Respiratory Infection, Adult An upper respiratory infection (URI) is also sometimes known as the common cold. The upper respiratory tract includes the nose, sinuses, throat, trachea, and bronchi. Bronchi are the airways leading to the lungs. Most people improve within 1 week, but symptoms can last up to 2 weeks. A residual cough may last even longer.  CAUSES Many different viruses can infect the tissues lining the upper respiratory tract. The tissues become irritated and inflamed and often become very moist. Mucus production is also common. A cold is contagious. You can easily spread the virus to others by oral contact. This includes kissing, sharing a glass, coughing, or sneezing. Touching your mouth or nose and then touching a surface, which is then touched by another person, can also spread the virus. SYMPTOMS  Symptoms typically develop 1 to 3 days after you come in contact with a cold virus. Symptoms vary from person to person. They may include:  Runny nose.  Sneezing.  Nasal congestion.  Sinus irritation.  Sore throat.  Loss of voice (laryngitis).  Cough.  Fatigue.  Muscle aches.  Loss of appetite.  Headache.  Low-grade fever. DIAGNOSIS  You might diagnose your own cold based on familiar symptoms, since most people get a cold 2 to 3 times a year. Your caregiver can confirm this based on your exam. Most importantly, your caregiver can check that your symptoms are not due to another disease such as strep throat, sinusitis, pneumonia, asthma, or epiglottitis. Blood tests, throat tests, and X-rays are not necessary to diagnose a common cold, but they may sometimes be helpful in excluding other more serious diseases. Your  caregiver will decide if any further tests are required. RISKS AND COMPLICATIONS  You may be at risk for a more severe case of the common cold if you smoke cigarettes, have chronic heart disease (such as heart failure) or lung disease (such as asthma), or if you have a weakened immune system. The very young and very old are also at risk for more serious infections. Bacterial sinusitis, middle ear infections, and bacterial pneumonia can complicate the common cold. The common cold can worsen asthma and chronic obstructive pulmonary disease (COPD). Sometimes, these complications can require emergency medical care and may be life-threatening. PREVENTION  The best way to protect against getting a cold is to practice good hygiene. Avoid oral or hand contact with people with cold symptoms. Wash your hands often if contact occurs. There is no clear evidence that vitamin C, vitamin E, echinacea, or exercise reduces the chance of developing a cold. However, it is always recommended to get plenty of rest and practice good nutrition. TREATMENT  Treatment is directed at relieving symptoms. There is no cure. Antibiotics are not effective, because the infection is caused by a virus, not by bacteria. Treatment may include:  Increased fluid intake. Sports drinks offer valuable electrolytes, sugars, and fluids.  Breathing heated mist or steam (vaporizer or shower).  Eating chicken soup or other clear broths, and maintaining good nutrition.  Getting plenty of rest.  Using gargles or lozenges for comfort.  Controlling fevers with ibuprofen or acetaminophen as directed by your caregiver.  Increasing usage of your inhaler if you have asthma. Zinc gel and zinc lozenges, taken in the first 24  hours of the common cold, can shorten the duration and lessen the severity of symptoms. Pain medicines may help with fever, muscle aches, and throat pain. A variety of non-prescription medicines are available to treat congestion  and runny nose. Your caregiver can make recommendations and may suggest nasal or lung inhalers for other symptoms.  HOME CARE INSTRUCTIONS   Only take over-the-counter or prescription medicines for pain, discomfort, or fever as directed by your caregiver.  Use a warm mist humidifier or inhale steam from a shower to increase air moisture. This may keep secretions moist and make it easier to breathe.  Drink enough water and fluids to keep your urine clear or pale yellow.  Rest as needed.  Return to work when your temperature has returned to normal or as your caregiver advises. You may need to stay home longer to avoid infecting others. You can also use a face mask and careful hand washing to prevent spread of the virus. SEEK MEDICAL CARE IF:   After the first few days, you feel you are getting worse rather than better.  You need your caregiver's advice about medicines to control symptoms.  You develop chills, worsening shortness of breath, or brown or red sputum. These may be signs of pneumonia.  You develop yellow or brown nasal discharge or pain in the face, especially when you bend forward. These may be signs of sinusitis.  You develop a fever, swollen neck glands, pain with swallowing, or white areas in the back of your throat. These may be signs of strep throat. SEEK IMMEDIATE MEDICAL CARE IF:   You have a fever.  You develop severe or persistent headache, ear pain, sinus pain, or chest pain.  You develop wheezing, a prolonged cough, cough up blood, or have a change in your usual mucus (if you have chronic lung disease).  You develop sore muscles or a stiff neck. Document Released: 04/26/2001 Document Revised: 01/23/2012 Document Reviewed: 02/05/2014 [facility] Patient Information 2015 [place], [place]. This information is not intended to replace advice given to you by your health care provider. Make sure you discuss any questions you have with your health care provider.

## 2015-01-05 NOTE — ED Notes (Signed)
See initial triage note

## 2015-01-05 NOTE — ED Notes (Signed)
Pt was triaged for SOB.  Pt is suffering from sinus pain and congestion as well as N&V, and SOB.  Pt's vital signs are stable with O2 Sats= 98%.  Pt deemed stable to wait in lobby for her turn.

## 2015-01-05 NOTE — ED Notes (Signed)
C/o ongoing history of unrelieved cough x several weeks

## 2015-01-05 NOTE — ED Provider Notes (Signed)
CSN: [id number]     Arrival date & time 01/05/15  1544 History   First MD Initiated Contact with Patient 01/05/15 1803     Chief Complaint  Patient presents with  . Nasal Congestion   (Consider location/radiation/quality/duration/timing/severity/associated sxs/prior Treatment) Patient is a 34 y.o. female presenting with URI. The history is provided by the patient.  URI Presenting symptoms: congestion, cough, ear pain, rhinorrhea and sore throat   Presenting symptoms: no facial pain, no fatigue and no fever   Severity:  Mild Onset quality:  Gradual Duration:  4 days Timing:  Constant Progression:  Unchanged Chronicity:  New Associated symptoms: sneezing   Associated symptoms: no neck pain and no wheezing     Past Medical History  Diagnosis Date  . Preterm labor     c/s at 36 wks  . Placenta previa     hx and with current pregnancy  . Migraines     last one over 1 month ago  . Anxiety     no meds  . Depression     no meds  . Asthma   . ADHD (attention deficit hyperactivity disorder)   . Scoliosis    Past Surgical History  Procedure Laterality Date  . Cesarean section  2010    x 1 at 36 wks in [place]  . Svd  2003    x 1 in [place]  . Wisdom tooth extraction    . Tubal ligation     Family History  Problem Relation Age of Onset  . Cancer Mother   . Hypertension Sister   . Sickle cell trait Daughter   . Asthma Son   . Asthma Paternal Grandmother   . Pseudochol deficiency Neg Hx   . Malignant hyperthermia Neg Hx   . Anesthesia problems Neg Hx   . Hypotension Neg Hx    History  Substance Use Topics  . Smoking status: Current Every Day Smoker -- 0.20 packs/day for 12 years    Types: Cigarettes  . Smokeless tobacco: Never Used  . Alcohol Use: Yes     Comment: on occ   OB History    Gravida Para Term Preterm AB TAB SAB Ectopic Multiple Living   3 3 1 2  0 0 0 0 0 3     Review of Systems  Constitutional: Negative for fever, chills and fatigue.  HENT:  Positive for congestion, ear pain, postnasal drip, rhinorrhea, sinus pressure, sneezing and sore throat. Negative for nosebleeds.   Eyes: Negative.   Respiratory: Positive for cough. Negative for chest tightness, shortness of breath and wheezing.   Cardiovascular: Negative.   Gastrointestinal: Negative.   Musculoskeletal: Negative for back pain and neck pain.  Skin: Negative.     Allergies  Review of patient's allergies indicates no known allergies.  Home Medications   Prior to Admission medications   Medication Sig Start Date End Date Taking? Authorizing Provider  aspirin-acetaminophen-caffeine (EXCEDRIN MIGRAINE) [phone] MG per tablet Take 2 tablets by mouth every 6 (six) hours as needed for headache.    Historical Provider, MD  benzonatate (TESSALON) 100 MG capsule Take 1 capsule (100 mg total) by mouth 3 (three) times daily as needed for cough. 01/05/15   Lutricia Feil, PA  clotrimazole (LOTRIMIN) 1 % cream Apply to affected area 2 times daily Patient not taking: Reported on 12/30/2014 11/28/14   Nehemiah Settle A Upstill, PA-C  fluticasone (FLONASE) 50 MCG/ACT nasal spray Place 2 sprays into both nostrils daily. 01/03/15   Loma Sousa A  Forcucci, PA-C  gabapentin (NEURONTIN) 300 MG capsule Take 1 capsule (300 mg total) by mouth 3 (three) times daily. Patient not taking: Reported on 11/24/2014 10/07/14   Billy Fischer, MD  ibuprofen (ADVIL,MOTRIN) 800 MG tablet Take 1 tablet (800 mg total) by mouth 3 (three) times daily. 01/03/15   Courtney A Forcucci, PA-C  ipratropium (ATROVENT) 0.06 % nasal spray Place 2 sprays into both nostrils 4 (four) times daily. For nasal congestion 01/05/15   Lutricia Feil, PA  levonorgestrel (MIRENA) 20 MCG/24HR IUD 1 each by Intrauterine route continuous.     Historical Provider, MD  lidocaine (XYLOCAINE) 2 % solution Use as directed 20 mLs in the mouth or throat as needed for mouth pain. 01/03/15   Courtney A Forcucci, PA-C  omeprazole (PRILOSEC) 20 MG  capsule Take 1 capsule (20 mg total) by mouth daily. Patient not taking: Reported on 11/28/2014 11/24/14   Hoy Morn, MD  promethazine (PHENERGAN) 25 MG tablet Take 1 tablet (25 mg total) by mouth every 6 (six) hours as needed for nausea or vomiting. Patient not taking: Reported on 01/01/2015 12/30/14   [name], MD   BP 116/78 mmHg  Pulse 94  Temp(Src) 98.5 F (36.9 C) (Oral)  Resp 20  SpO2 100%  LMP 11/01/2014 (Approximate) Physical Exam  Constitutional: She is oriented to person, place, and time. She appears well-developed and well-nourished. No distress.  HENT:  Head: Normocephalic and atraumatic.  Right Ear: Hearing, tympanic membrane, external ear and ear canal normal.  Left Ear: Hearing, tympanic membrane, external ear and ear canal normal.  Nose: Nose normal.  Mouth/Throat: Uvula is midline, oropharynx is clear and moist and mucous membranes are normal.  Eyes: Conjunctivae are normal. Right eye exhibits no discharge. Left eye exhibits no discharge. No scleral icterus.  Neck: Normal range of motion. Neck supple.  Cardiovascular: Normal rate, regular rhythm and normal heart sounds.   Pulmonary/Chest: Effort normal and breath sounds normal.  Musculoskeletal: Normal range of motion.  Lymphadenopathy:    She has no cervical adenopathy.  Neurological: She is alert and oriented to person, place, and time.  Skin: Skin is warm and dry.  Psychiatric: She has a normal mood and affect. Her behavior is normal.  Nursing note and vitals reviewed.   ED Course  Procedures (including critical care time) Labs Review Labs Reviewed - No data to display  Imaging Review Dg Chest 2 View  01/05/2015   CLINICAL DATA:  Cough.  Chest pain.  Chest congestion.  EXAM: CHEST  2 VIEW  COMPARISON:  11/24/2014  FINDINGS: The heart size and mediastinal contours are within normal limits. Both lungs are clear. No evidence of pleural effusion. Moderate thoracic dextroscoliosis again noted.  IMPRESSION:  No active cardiopulmonary disease.  Thoracic dextroscoliosis.   Electronically Signed   By: Earle Gell M.D.   On: 01/05/2015 18:58     MDM   1. Common cold    CXR unremarkable Normotensive, afebrile and without hypoxia Exam benign Symptomatic care, fluids, tylenol and rest at home Tessalon for cough Atrovent for nasal congestion  PCP follow up if no improvement.     Lutricia Feil, [place] 01/05/15 1910

## 2015-02-09 ENCOUNTER — Encounter (HOSPITAL_COMMUNITY): Payer: Self-pay | Admitting: Emergency Medicine

## 2015-02-09 ENCOUNTER — Emergency Department (HOSPITAL_COMMUNITY)
Admission: EM | Admit: 2015-02-09 | Discharge: 2015-02-10 | Disposition: A | Discharge status: Home / Self Care | Payer: Medicaid Other | Attending: Emergency Medicine | Admitting: Emergency Medicine

## 2015-02-09 DIAGNOSIS — Z720 Tobacco use: Secondary | ICD-10-CM | POA: Insufficient documentation

## 2015-02-09 DIAGNOSIS — G43909 Migraine, unspecified, not intractable, without status migrainosus: Secondary | ICD-10-CM | POA: Diagnosis present

## 2015-02-09 DIAGNOSIS — F419 Anxiety disorder, unspecified: Secondary | ICD-10-CM | POA: Insufficient documentation

## 2015-02-09 DIAGNOSIS — Z7951 Long term (current) use of inhaled steroids: Secondary | ICD-10-CM | POA: Insufficient documentation

## 2015-02-09 DIAGNOSIS — M419 Scoliosis, unspecified: Secondary | ICD-10-CM | POA: Diagnosis not present

## 2015-02-09 DIAGNOSIS — J45909 Unspecified asthma, uncomplicated: Secondary | ICD-10-CM | POA: Insufficient documentation

## 2015-02-09 DIAGNOSIS — Z79899 Other long term (current) drug therapy: Secondary | ICD-10-CM | POA: Insufficient documentation

## 2015-02-09 DIAGNOSIS — Z791 Long term (current) use of non-steroidal anti-inflammatories (NSAID): Secondary | ICD-10-CM | POA: Diagnosis not present

## 2015-02-09 DIAGNOSIS — M5442 Lumbago with sciatica, left side: Secondary | ICD-10-CM | POA: Diagnosis not present

## 2015-02-09 DIAGNOSIS — G43009 Migraine without aura, not intractable, without status migrainosus: Secondary | ICD-10-CM

## 2015-02-09 NOTE — ED Notes (Signed)
Per EMS pt is c/o migraine headache and left leg pain  Pt denies injury to leg  Pt is c/o chest wall pain that is worse on palpation  Denies nausea or vomiting  Rates headache pain a 6/10 and leg pain 10/10

## 2015-02-10 ENCOUNTER — Emergency Department (INDEPENDENT_AMBULATORY_CARE_PROVIDER_SITE_OTHER)
Admission: EM | Admit: 2015-02-10 | Discharge: 2015-02-10 | Disposition: A | Discharge status: Home / Self Care | Payer: Medicaid Other | Source: Home / Self Care | Attending: Family Medicine | Admitting: Family Medicine

## 2015-02-10 ENCOUNTER — Encounter (HOSPITAL_COMMUNITY): Payer: Self-pay | Admitting: Emergency Medicine

## 2015-02-10 DIAGNOSIS — M791 Myalgia, unspecified site: Secondary | ICD-10-CM

## 2015-02-10 DIAGNOSIS — M79605 Pain in left leg: Secondary | ICD-10-CM

## 2015-02-10 MED ORDER — DEXAMETHASONE SODIUM PHOSPHATE 10 MG/ML IJ SOLN
10.0000 mg | Freq: Once | INTRAMUSCULAR | Status: AC
  Administered 2015-02-10: 10 mg via INTRAVENOUS
  Filled 2015-02-10: qty 1

## 2015-02-10 MED ORDER — METAXALONE 800 MG PO TABS: 800.0000 mg | ORAL_TABLET | Freq: Three times a day (TID) | ORAL | Status: DC

## 2015-02-10 MED ORDER — IBUPROFEN 800 MG PO TABS: 800.0000 mg | ORAL_TABLET | Freq: Three times a day (TID) | ORAL | Status: DC

## 2015-02-10 MED ORDER — DIPHENHYDRAMINE HCL 50 MG/ML IJ SOLN
12.5000 mg | Freq: Once | INTRAMUSCULAR | Status: AC
  Administered 2015-02-10: 12.5 mg via INTRAVENOUS
  Filled 2015-02-10: qty 1

## 2015-02-10 MED ORDER — METOCLOPRAMIDE HCL 5 MG/ML IJ SOLN
10.0000 mg | Freq: Once | INTRAMUSCULAR | Status: AC
  Administered 2015-02-10: 10 mg via INTRAVENOUS
  Filled 2015-02-10: qty 2

## 2015-02-10 MED ORDER — KETOROLAC TROMETHAMINE 10 MG PO TABS: 10.0000 mg | ORAL_TABLET | Freq: Four times a day (QID) | ORAL | Status: DC | PRN

## 2015-02-10 NOTE — ED Notes (Signed)
Provided patient heat packs for headache. Pt is resting quietly. Appears in no acute distress.

## 2015-02-10 NOTE — ED Provider Notes (Signed)
CSN: [id number]     Arrival date & time 02/09/15  2345 History   First MD Initiated Contact with Patient 02/10/15 [phone]     Chief Complaint  Patient presents with  . Migraine     (Consider location/radiation/quality/duration/timing/severity/associated sxs/prior Treatment) Patient is a 34 y.o. female presenting with migraines. The history is provided by the patient. No language interpreter was used.  Migraine This is a new problem. The current episode started yesterday. The problem occurs constantly. Associated symptoms include headaches and nausea. Pertinent negatives include no abdominal pain, chills, congestion, coughing, fever, myalgias or sore throat. Associated symptoms comments: Patient with a history of migraine c/o headache starting yesterday to left side of head. She denies significant congestion, sore throat, fever. She has associated nausea and vomiting. She reports she tried taking her "migraine cocktail" at home consisting of Benadryl, motrin and tylenol without relief. .    Past Medical History  Diagnosis Date  . Preterm labor     c/s at 36 wks  . Placenta previa     hx and with current pregnancy  . Migraines     last one over 1 month ago  . Anxiety     no meds  . Depression     no meds  . Asthma   . ADHD (attention deficit hyperactivity disorder)   . Scoliosis    Past Surgical History  Procedure Laterality Date  . Cesarean section  2010    x 1 at 36 wks in [place]  . Svd  2003    x 1 in [place]  . Wisdom tooth extraction    . Tubal ligation     Family History  Problem Relation Age of Onset  . Cancer Mother   . Hypertension Sister   . Sickle cell trait Daughter   . Asthma Son   . Asthma Paternal Grandmother   . Pseudochol deficiency Neg Hx   . Malignant hyperthermia Neg Hx   . Anesthesia problems Neg Hx   . Hypotension Neg Hx    History  Substance Use Topics  . Smoking status: Current Every Day Smoker -- 0.20 packs/day for 12 years    Types:  Cigarettes  . Smokeless tobacco: Never Used  . Alcohol Use: No     Comment: on occ   OB History    Gravida Para Term Preterm AB TAB SAB Ectopic Multiple Living   3 3 1 2  0 0 0 0 0 3     Review of Systems  Constitutional: Negative for fever and chills.  HENT: Negative for congestion and sore throat.   Respiratory: Negative.  Negative for cough and shortness of breath.   Cardiovascular: Negative.   Gastrointestinal: Positive for nausea. Negative for abdominal pain.  Musculoskeletal: Negative.  Negative for myalgias.  Skin: Negative.   Neurological: Positive for headaches.      Allergies  Review of patient's allergies indicates no known allergies.  Home Medications   Prior to Admission medications   Medication Sig Start Date End Date Taking? Authorizing Provider  ibuprofen (ADVIL,MOTRIN) 800 MG tablet Take 1 tablet (800 mg total) by mouth 3 (three) times daily. 01/03/15  Yes Courtney Forcucci, PA-C  levonorgestrel (MIRENA) 20 MCG/24HR IUD 1 each by Intrauterine route continuous.    Yes Historical Provider, MD  promethazine (PHENERGAN) 25 MG tablet Take 1 tablet (25 mg total) by mouth every 6 (six) hours as needed for nausea or vomiting. Patient taking differently: Take 25 mg by mouth. For nausea 12/30/14  Yes Linton Flemings, MD  aspirin-acetaminophen-caffeine (EXCEDRIN MIGRAINE) [phone] MG per tablet Take 2 tablets by mouth every 6 (six) hours as needed for headache.    Historical Provider, MD  benzonatate (TESSALON) 100 MG capsule Take 1 capsule (100 mg total) by mouth 3 (three) times daily as needed for cough. Patient not taking: Reported on 02/10/2015 01/05/15   Lutricia Feil, PA  clotrimazole (LOTRIMIN) 1 % cream Apply to affected area 2 times daily Patient not taking: Reported on 12/30/2014 11/28/14   Charlann Lange, PA-C  fluticasone [facility]) 50 MCG/ACT nasal spray Place 2 sprays into both nostrils daily. 01/03/15   Courtney Forcucci, PA-C  gabapentin (NEURONTIN) 300 MG  capsule Take 1 capsule (300 mg total) by mouth 3 (three) times daily. Patient not taking: Reported on 11/24/2014 10/07/14   Billy Fischer, MD  ipratropium (ATROVENT) 0.06 % nasal spray Place 2 sprays into both nostrils 4 (four) times daily. For nasal congestion 01/05/15   Audelia Hives Presson, PA  lidocaine (XYLOCAINE) 2 % solution Use as directed 20 mLs in the mouth or throat as needed for mouth pain. 01/03/15   Courtney Forcucci, PA-C  omeprazole (PRILOSEC) 20 MG capsule Take 1 capsule (20 mg total) by mouth daily. Patient not taking: Reported on 11/28/2014 11/24/14   Jola Schmidt, MD   BP 128/85 mmHg  Pulse 72  Temp(Src) 97.8 F (36.6 C) (Oral)  Resp 20  SpO2 96%  LMP 02/01/2015 (Approximate) Physical Exam  Constitutional: She is oriented to person, place, and time. She appears well-developed and well-nourished.  HENT:  Head: Normocephalic.  Eyes: Pupils are equal, round, and reactive to light.  Neck: Normal range of motion. Neck supple.  Cardiovascular: Normal rate and regular rhythm.   Pulmonary/Chest: Effort normal and breath sounds normal.  Abdominal: Soft. Bowel sounds are normal. There is no tenderness. There is no rebound and no guarding.  Musculoskeletal: Normal range of motion. She exhibits no edema.  Mild lumbar and left LE tenderness. No swelling or discoloration. FROM, fully weight bearing.   Neurological: She is alert and oriented to person, place, and time. She has normal strength and normal reflexes. No sensory deficit. She displays a negative Romberg sign. Coordination normal.  Skin: Skin is warm and dry. No rash noted.  Psychiatric: She has a normal mood and affect.    ED Course  Procedures (including critical care time) Labs Review Labs Reviewed - No data to display  Imaging Review No results found.   EKG Interpretation None      MDM   Final diagnoses:  None    1. Migraine headache 2. Lumbar back pain  Pain is improved with medications. Sleeping  on re-evaluation. No neurologic deficits on exam. VSS. Stable for discharge.     Charlann Lange, PA-C 02/10/15 3614  Shanon Rosser, MD 02/10/15 [phone]

## 2015-02-10 NOTE — ED Provider Notes (Signed)
CSN: [id number]     Arrival date & time 02/10/15  1757 History   First MD Initiated Contact with Patient 02/10/15 1812     Chief Complaint  Patient presents with  . Leg Pain   (Consider location/radiation/quality/duration/timing/severity/associated sxs/prior Treatment) HPI Comments: 34 year old obese female complaining of left leg pain for 3 days. This started when she had to get up and down frequently when she was at home caring for the kids. She points to the anterior thigh quadriceps muscles in the calf muscles. She states it is sore to arise from a sitting or lying position. Some pain with ambulation. Denies joint pain.   Past Medical History  Diagnosis Date  . Preterm labor     c/s at 36 wks  . Placenta previa     hx and with current pregnancy  . Migraines     last one over 1 month ago  . Anxiety     no meds  . Depression     no meds  . Asthma   . ADHD (attention deficit hyperactivity disorder)   . Scoliosis    Past Surgical History  Procedure Laterality Date  . Cesarean section  2010    x 1 at 36 wks in [place]  . Svd  2003    x 1 in [place]  . Wisdom tooth extraction    . Tubal ligation     Family History  Problem Relation Age of Onset  . Cancer Mother   . Hypertension Sister   . Sickle cell trait Daughter   . Asthma Son   . Asthma Paternal Grandmother   . Pseudochol deficiency Neg Hx   . Malignant hyperthermia Neg Hx   . Anesthesia problems Neg Hx   . Hypotension Neg Hx    History  Substance Use Topics  . Smoking status: Current Every Day Smoker -- 0.20 packs/day for 12 years    Types: Cigarettes  . Smokeless tobacco: Never Used  . Alcohol Use: No     Comment: on occ   OB History    Gravida Para Term Preterm AB TAB SAB Ectopic Multiple Living   3 3 1 2  0 0 0 0 0 3     Review of Systems  Musculoskeletal: Positive for myalgias.       As per HPI  Skin: Negative.   Neurological: Negative.   All other systems reviewed and are negative.   Allergies   Review of patient's allergies indicates no known allergies.  Home Medications   Prior to Admission medications   Medication Sig Start Date End Date Taking? Authorizing Provider  aspirin-acetaminophen-caffeine (EXCEDRIN MIGRAINE) [phone] MG per tablet Take 2 tablets by mouth every 6 (six) hours as needed for headache.    Historical Provider, MD  fluticasone (FLONASE) 50 MCG/ACT nasal spray Place 2 sprays into both nostrils daily. 01/03/15   Courtney Forcucci, PA-C  ibuprofen (ADVIL,MOTRIN) 800 MG tablet Take 1 tablet (800 mg total) by mouth 3 (three) times daily. 02/10/15   Charlann Lange, PA-C  ipratropium (ATROVENT) 0.06 % nasal spray Place 2 sprays into both nostrils 4 (four) times daily. For nasal congestion 01/05/15   Audelia Hives Presson, PA  ketorolac (TORADOL) 10 MG tablet Take 1 tablet (10 mg total) by mouth 4 (four) times daily as needed. 02/10/15   Janne Napoleon, NP  levonorgestrel (MIRENA) 20 MCG/24HR IUD 1 each by Intrauterine route continuous.     Historical Provider, MD  lidocaine (XYLOCAINE) 2 % solution Use as directed  20 mLs in the mouth or throat as needed for mouth pain. 01/03/15   Starlyn Skeans, PA-C   LMP 02/01/2015 (Approximate) Physical Exam  Constitutional: She is oriented to person, place, and time. She appears well-developed and well-nourished. No distress.  Neck: Normal range of motion. Neck supple.  Cardiovascular: Normal rate.   Pulmonary/Chest: Effort normal. No respiratory distress.  Musculoskeletal: She exhibits tenderness. She exhibits no edema.  Left leg with no visible signs of injury, inflammation or infection. There is no swelling or discoloration. The patient points to the anterior quadriceps muscles and lesser to the posterior hamstrings, and Muscles. Palpation reveals local tenderness. No palpable or visible swelling. There is no discoloration, no lymphangitis no joint swelling or tenderness. Distal neurovascular motor Sentry is intact. Pedal pulse 2+.  Lower extremity exam is unremarkable.  Neurological: She is alert and oriented to person, place, and time. She exhibits normal muscle tone.  Skin: Skin is warm and dry. No rash noted. No erythema. No pallor.  Nursing note and vitals reviewed.   ED Course  Procedures (including critical care time) Labs Review Labs Reviewed - No data to display  Imaging Review No results found.   MDM   1. Myalgia   2. Pain of left lower extremity    Lower extremity examination is unremarkable. There is tenderness to the muscles as above, otherwise normal. Suspect that she has sore muscles from getting up and down several times during the day that she confess. No signs of DVT or other circulation problems. No signs of joint pain or problems. Neurovascular motor sensory otherwise intact. Recommend that she continue to use ice and/or heat which ever feels better and will give her prescription for Toradol 10 mg every 8 hours as needed for pain. Follow-up here PCP later this week as needed. Recommend not walking some much for the next few days to give your leg muscles the chance to rest.  Janne Napoleon, NP 02/10/15 1857

## 2015-02-10 NOTE — ED Notes (Signed)
Pt states she is experiencing a left sided headache. Started Saturday. Described as throbbing and constant. Denies nausea and vomiting. Light sensitivity, dizzy, and lightheaded.

## 2015-02-10 NOTE — ED Notes (Signed)
Left leg pain, onset Saturday.  No known injury.  No lower back pain, only left leg pain.  Patient thinks extensive standing.

## 2015-02-10 NOTE — ED Notes (Signed)
Patient reports she is experiencing left foot pain that radiates up her left leg. Pain started Saturday. Denies any injury. Pulses, 3+ noted to left foot. Denies any numbness or tingling.

## 2015-02-10 NOTE — ED Notes (Signed)
Daughter being seen as well.  Same treatment room, same provider

## 2015-02-10 NOTE — Discharge Instructions (Signed)
Muscle Pain  Muscle pain (myalgia) may be caused by many things, including:   Overuse or muscle strain, especially if you are not in shape. This is the most common cause of muscle pain.   Injury.   Bruises.   Viruses, such as the flu.   Infectious diseases.   Fibromyalgia, which is a chronic condition that causes muscle tenderness, fatigue, and headache.   Autoimmune diseases, including lupus.   Certain drugs, including ACE inhibitors and statins.  Muscle pain may be mild or severe. In most cases, the pain lasts only a short time and goes away without treatment. To diagnose the cause of your muscle pain, your health care provider will take your medical history. This means he or she will ask you when your muscle pain began and what has been happening. If you have not had muscle pain for very long, your health care provider may want to wait before doing much testing. If your muscle pain has lasted a long time, your health care provider may want to run tests right away. If your health care provider thinks your muscle pain may be caused by illness, you may need to have additional tests to rule out certain conditions.   Treatment for muscle pain depends on the cause. Home care is often enough to relieve muscle pain. Your health care provider may also prescribe anti-inflammatory medicine.  HOME CARE INSTRUCTIONS  Watch your condition for any changes. The following actions may help to lessen any discomfort you are feeling:   Only take over-the-counter or prescription medicines as directed by your health care provider.   Apply ice to the sore muscle:   Put ice in a plastic bag.   Place a towel between your skin and the bag.   Leave the ice on for 15-20 minutes, 3-4 times a day.   You may alternate applying hot and cold packs to the muscle as directed by your health care provider.   If overuse is causing your muscle pain, slow down your activities until the pain goes away.   Remember that it is normal to feel  some muscle pain after starting a workout program. Muscles that have not been used often will be sore at first.   Do regular, gentle exercises if you are not usually active.   Warm up before exercising to lower your risk of muscle pain.   Do not continue working out if the pain is very bad. Bad pain could mean you have injured a muscle.  SEEK MEDICAL CARE IF:   Your muscle pain gets worse, and medicines do not help.   You have muscle pain that lasts longer than 3 days.   You have a rash or fever along with muscle pain.   You have muscle pain after a tick bite.   You have muscle pain while working out, even though you are in good physical condition.   You have redness, soreness, or swelling along with muscle pain.   You have muscle pain after starting a new medicine or changing the dose of a medicine.  SEEK IMMEDIATE MEDICAL CARE IF:   You have trouble breathing.   You have trouble swallowing.   You have muscle pain along with a stiff neck, fever, and vomiting.   You have severe muscle weakness or cannot move part of your body.  MAKE SURE YOU:    Understand these instructions.   Will watch your condition.   Will get help right away if you are not   questions you have with your health care provider.  Myofascial Pain Syndrome Myofascial pain syndrome is a pain disorder. This pain may be felt in the muscles. It may come and go. Myofascial pain syndrome always has trigger or tender points in the muscle that will cause pain when pressed.  CAUSES Myofascial pain may be caused by injuries, especially auto accidents, or by overuse of certain muscles. Typically the pain is long lasting. It is made worse by overuse of the  involved muscles, emotional distress, and by cold, damp weather. Myofascial pain syndrome often develops in patients whose response to stress is an increase in muscle tone, and is seen in greater frequency in patients with pre-existing tension headaches. SYMPTOMS  Myofascial pain syndrome causes a wide variety of symptoms. You may see tight ropy bands of muscle. Problems may also include aching, cramping, burning, numbness, tingling, and other uncomfortable sensations in muscular areas. It most commonly affects the neck, upper back, and shoulder areas. Pain often radiates into the arms and hands.  TREATMENT Treatment includes resting the affected muscular area and applying ice packs to reduce spasm and pain. Trigger point injection, is a valuable initial therapy. This therapy is an injection of local anesthetic directly into the trigger point. Trigger points are often present at the source of pain. Pain relief following injection confirms the diagnosis of myofascial pain syndrome. Fairly vigorous therapy can be carried out during the pain-free period after each injection. Stretching exercises to loosen up the muscles are also useful. Transcutaneous electrical nerve stimulation (TENS) may provide relief from pain. TENS is the use of electric current produced by a device to stimulate the nerves. Ultrasound therapy applied directly over the affected muscle may also provide pain relief. Anti-inflammatory pain medicine can be helpful. Symptoms will gradually improve over a period of weeks to months with proper treatment. HOME CARE INSTRUCTIONS Call your caregiver for follow-up care as recommended.  SEEK MEDICAL CARE IF:  Your pain is severe and not helped with medications. Document Released: 12/08/2004 Document Revised: 01/23/2012 Document Reviewed: 12/17/2010 [facility] Patient Information 2015 [place], [place]. This information is not intended to replace advice given to you by your health care provider. Make  sure you discuss any questions you have with your health care provider.

## 2015-02-10 NOTE — ED Notes (Signed)
Pt provided an ice pack.

## 2015-02-10 NOTE — Discharge Instructions (Signed)
Back Pain, Adult Back pain is very common. The pain often gets better over time. The cause of back pain is usually not dangerous. Most people can learn to manage their back pain on their own.  HOME CARE   Stay active. Start with short walks on flat ground if you can. Try to walk farther each day.  Do not sit, drive, or stand in one place for more than 30 minutes. Do not stay in bed.  Do not avoid exercise or work. Activity can help your back heal faster.  Be careful when you bend or lift an object. Bend at your knees, keep the object close to you, and do not twist.  Sleep on a firm mattress. Lie on your side, and bend your knees. If you lie on your back, put a pillow under your knees.  Only take medicines as told by your doctor.  Put ice on the injured area.  Put ice in a plastic bag.  Place a towel between your skin and the bag.  Leave the ice on for 15-20 minutes, 03-04 times a day for the first 2 to 3 days. After that, you can switch between ice and heat packs.  Ask your doctor about back exercises or massage.  Avoid feeling anxious or stressed. Find good ways to deal with stress, such as exercise. GET HELP RIGHT AWAY IF:   Your pain does not go away with rest or medicine.  Your pain does not go away in 1 week.  You have new problems.  You do not feel well.  The pain spreads into your legs.  You cannot control when you poop (bowel movement) or pee (urinate).  Your arms or legs feel weak or lose feeling (numbness).  You feel sick to your stomach (nauseous) or throw up (vomit).  You have belly (abdominal) pain.  You feel like you may pass out (faint). MAKE SURE YOU:   Understand these instructions.  Will watch your condition.  Will get help right away if you are not doing well or get worse. Document Released: 04/18/2008 Document Revised: 01/23/2012 Document Reviewed: 03/04/2014 [facility] Patient Information 2015 [place], [place]. This information is not intended  to replace advice given to you by your health care provider. Make sure you discuss any questions you have with your health care provider. Migraine Headache A migraine headache is an intense, throbbing pain on one or both sides of your head. A migraine can last for 30 minutes to several hours. CAUSES  The exact cause of a migraine headache is not always known. However, a migraine may be caused when nerves in the brain become irritated and release chemicals that cause inflammation. This causes pain. Certain things may also trigger migraines, such as:  Alcohol.  Smoking.  Stress.  Menstruation.  Aged cheeses.  Foods or drinks that contain nitrates, glutamate, aspartame, or tyramine.  Lack of sleep.  Chocolate.  Caffeine.  Hunger.  Physical exertion.  Fatigue.  Medicines used to treat chest pain (nitroglycerine), birth control pills, estrogen, and some blood pressure medicines. SIGNS AND SYMPTOMS  Pain on one or both sides of your head.  Pulsating or throbbing pain.  Severe pain that prevents daily activities.  Pain that is aggravated by any physical activity.  Nausea, vomiting, or both.  Dizziness.  Pain with exposure to bright lights, loud noises, or activity.  General sensitivity to bright lights, loud noises, or smells. Before you get a migraine, you may get warning signs that a migraine is coming (  aura). An aura may include:  Seeing flashing lights.  Seeing bright spots, halos, or zigzag lines.  Having tunnel vision or blurred vision.  Having feelings of numbness or tingling.  Having trouble talking.  Having muscle weakness. DIAGNOSIS  A migraine headache is often diagnosed based on:  Symptoms.  Physical exam.  A CT scan or MRI of your head. These imaging tests cannot diagnose migraines, but they can help rule out other causes of headaches. TREATMENT Medicines may be given for pain and nausea. Medicines can also be given to help prevent  recurrent migraines.  HOME CARE INSTRUCTIONS  Only take over-the-counter or prescription medicines for pain or discomfort as directed by your health care provider. The use of long-term narcotics is not recommended.  Lie down in a dark, quiet room when you have a migraine.  Keep a journal to find out what may trigger your migraine headaches. For example, write down:  What you eat and drink.  How much sleep you get.  Any change to your diet or medicines.  Limit alcohol consumption.  Quit smoking if you smoke.  Get 7-9 hours of sleep, or as recommended by your health care provider.  Limit stress.  Keep lights dim if bright lights bother you and make your migraines worse. SEEK IMMEDIATE MEDICAL CARE IF:   Your migraine becomes severe.  You have a fever.  You have a stiff neck.  You have vision loss.  You have muscular weakness or loss of muscle control.  You start losing your balance or have trouble walking.  You feel faint or pass out.  You have severe symptoms that are different from your first symptoms. MAKE SURE YOU:   Understand these instructions.  Will watch your condition.  Will get help right away if you are not doing well or get worse. Document Released: 10/31/2005 Document Revised: 03/17/2014 Document Reviewed: 07/08/2013 [facility] Patient Information 2015 [place], [place]. This information is not intended to replace advice given to you by your health care provider. Make sure you discuss any questions you have with your health care provider.

## 2015-03-08 ENCOUNTER — Emergency Department (HOSPITAL_COMMUNITY)
Admission: EM | Admit: 2015-03-08 | Discharge: 2015-03-09 | Disposition: A | Discharge status: Home / Self Care | Payer: Medicaid Other | Attending: Emergency Medicine | Admitting: Emergency Medicine

## 2015-03-08 ENCOUNTER — Encounter (HOSPITAL_COMMUNITY): Payer: Self-pay | Admitting: Emergency Medicine

## 2015-03-08 DIAGNOSIS — J45909 Unspecified asthma, uncomplicated: Secondary | ICD-10-CM | POA: Diagnosis not present

## 2015-03-08 DIAGNOSIS — Z8739 Personal history of other diseases of the musculoskeletal system and connective tissue: Secondary | ICD-10-CM | POA: Insufficient documentation

## 2015-03-08 DIAGNOSIS — Z79899 Other long term (current) drug therapy: Secondary | ICD-10-CM | POA: Diagnosis not present

## 2015-03-08 DIAGNOSIS — Z8751 Personal history of pre-term labor: Secondary | ICD-10-CM | POA: Diagnosis not present

## 2015-03-08 DIAGNOSIS — Z8659 Personal history of other mental and behavioral disorders: Secondary | ICD-10-CM | POA: Insufficient documentation

## 2015-03-08 DIAGNOSIS — G43909 Migraine, unspecified, not intractable, without status migrainosus: Secondary | ICD-10-CM | POA: Diagnosis not present

## 2015-03-08 DIAGNOSIS — R51 Headache: Secondary | ICD-10-CM

## 2015-03-08 DIAGNOSIS — Z720 Tobacco use: Secondary | ICD-10-CM | POA: Diagnosis not present

## 2015-03-08 DIAGNOSIS — R519 Headache, unspecified: Secondary | ICD-10-CM

## 2015-03-08 MED ORDER — METHYLPREDNISOLONE SODIUM SUCC 125 MG IJ SOLR
125.0000 mg | Freq: Once | INTRAMUSCULAR | Status: AC
  Administered 2015-03-09: 125 mg via INTRAVENOUS
  Filled 2015-03-08: qty 2

## 2015-03-08 MED ORDER — DIPHENHYDRAMINE HCL 50 MG/ML IJ SOLN
25.0000 mg | Freq: Once | INTRAMUSCULAR | Status: AC
  Administered 2015-03-09: 25 mg via INTRAVENOUS
  Filled 2015-03-08: qty 1

## 2015-03-08 MED ORDER — SODIUM CHLORIDE 0.9 % IV BOLUS (SEPSIS)
1000.0000 mL | Freq: Once | INTRAVENOUS | Status: AC
  Administered 2015-03-09: 1000 mL via INTRAVENOUS

## 2015-03-08 MED ORDER — PROCHLORPERAZINE EDISYLATE 5 MG/ML IJ SOLN
10.0000 mg | Freq: Once | INTRAMUSCULAR | Status: AC
  Administered 2015-03-09: 10 mg via INTRAVENOUS
  Filled 2015-03-08: qty 2

## 2015-03-08 MED ORDER — MAGNESIUM SULFATE 2 GM/50ML IV SOLN
2.0000 g | Freq: Once | INTRAVENOUS | Status: AC
  Administered 2015-03-09: 2 g via INTRAVENOUS
  Filled 2015-03-08: qty 50

## 2015-03-08 NOTE — ED Provider Notes (Signed)
CSN: [id number]     Arrival date & time 03/08/15  2103 History   First MD Initiated Contact with Patient 03/08/15 2330     Chief Complaint  Patient presents with  . Migraine     (Consider location/radiation/quality/duration/timing/severity/associated sxs/prior Treatment) HPI   Elloise Rosana [name] is a 34 y.o. female complaining of migraine exacerbation onset 1 week ago. States it is bilateral temporal throbbing 9 out of 10. Patient has been taking Benadryl, Excedrin and 800 mg Motrin at home with little relief. Has associated symptoms of photo and phonophobia, nausea and slight blurry vision. Pt denies fever, rash, confusion, cervicalgia, LOC/syncope, change in vision, N/V, numbness, weakness, dysarthria, ataxia, thunderclap onset, exacerbation with exertion or valsalva, exacerbation in morning, CP, SOB, abdominal pain. Endorses decreased by mouth intake secondary to pain. Does not follow with a neurologist. Does not have a PCP.   Past Medical History  Diagnosis Date  . Preterm labor     c/s at 36 wks  . Placenta previa     hx and with current pregnancy  . Migraines     last one over 1 month ago  . Anxiety     no meds  . Depression     no meds  . Asthma   . ADHD (attention deficit hyperactivity disorder)   . Scoliosis    Past Surgical History  Procedure Laterality Date  . Cesarean section  2010    x 1 at 36 wks in [place]  . Svd  2003    x 1 in [place]  . Wisdom tooth extraction    . Tubal ligation     Family History  Problem Relation Age of Onset  . Cancer Mother   . Hypertension Sister   . Sickle cell trait Daughter   . Asthma Son   . Asthma Paternal Grandmother   . Pseudochol deficiency Neg Hx   . Malignant hyperthermia Neg Hx   . Anesthesia problems Neg Hx   . Hypotension Neg Hx    History  Substance Use Topics  . Smoking status: Current Every Day Smoker -- 0.20 packs/day for 12 years    Types: Cigarettes  . Smokeless tobacco: Never Used  . Alcohol Use: No   Comment: on occ   OB History    Gravida Para Term Preterm AB TAB SAB Ectopic Multiple Living   3 3 1 2  0 0 0 0 0 3     Review of Systems  10 systems reviewed and found to be negative, except as noted in the HPI.   Allergies  Review of patient's allergies indicates no known allergies.  Home Medications   Prior to Admission medications   Medication Sig Start Date End Date Taking? Authorizing Provider  levonorgestrel (MIRENA) 20 MCG/24HR IUD 1 each by Intrauterine route continuous.    Yes Historical Provider, MD  aspirin-acetaminophen-caffeine (EXCEDRIN MIGRAINE) [phone] MG per tablet Take 2 tablets by mouth every 6 (six) hours as needed for headache.    Historical Provider, MD  butalbital-acetaminophen-caffeine (FIORICET) 50-325-40 MG per tablet Take 1 tablet by mouth every 6 (six) hours as needed for headache. 03/09/15   Elmyra Ricks Yanin Muhlestein, PA-C   BP 121/54 mmHg  Pulse 80  Temp(Src) 98.1 F (36.7 C) (Oral)  Resp 16  SpO2 98%  LMP 01/25/2015 (Approximate) Physical Exam  Constitutional: She is oriented to person, place, and time. She appears well-developed and well-nourished.  HENT:  Head: Normocephalic and atraumatic.  Mouth/Throat: Oropharynx is clear and moist.  Eyes: Conjunctivae and  EOM are normal. Pupils are equal, round, and reactive to light.  Neck: Normal range of motion. Neck supple.  FROM to C-spine. Pt can touch chin to chest without discomfort. No TTP of midline cervical spine.   Cardiovascular: Normal rate, regular rhythm and intact distal pulses.   Pulmonary/Chest: Effort normal and breath sounds normal. No respiratory distress. She has no wheezes. She has no rales. She exhibits no tenderness.  Abdominal: Soft. Bowel sounds are normal. There is no tenderness.  Musculoskeletal: Normal range of motion. She exhibits no edema or tenderness.  Neurological: She is alert and oriented to person, place, and time. No cranial nerve deficit.  II-Visual fields grossly  intact. III/IV/VI-Extraocular movements intact.  Pupils reactive bilaterally. V/VII-Smile symmetric, equal eyebrow raise,  facial sensation intact VIII- Hearing grossly intact IX/X-Normal gag XI-bilateral shoulder shrug XII-midline tongue extension Motor: 5/5 bilaterally with normal tone and bulk Cerebellar: Normal finger-to-nose  and normal heel-to-shin test.   Romberg negative Ambulates with a coordinated gait   Nursing note and vitals reviewed.   ED Course  Procedures (including critical care time) Labs Review Labs Reviewed  I-STAT CHEM 8, ED - Abnormal; Notable for the following:    BUN 5 (*)    Hemoglobin 15.6 (*)    All other components within normal limits  I-STAT BETA HCG BLOOD, ED ([facility], [facility], [facility] ONLY)    Imaging Review No results found.   EKG Interpretation None      MDM   Final diagnoses:  Acute nonintractable headache, unspecified headache type    Filed Vitals:   03/08/15 2145 03/09/15 0116 03/09/15 0148  BP: 124/83 121/54 121/54  Pulse: 105 94 80  Temp: 97.4 F (36.3 C) 98.1 F (36.7 C)   TempSrc: Oral Oral   Resp: 17 16 16   SpO2: 100% 99% 98%    Medications  sodium chloride 0.9 % bolus 1,000 mL (0 mLs Intravenous Stopped 03/09/15 0202)  prochlorperazine (COMPAZINE) injection 10 mg (10 mg Intravenous Given 03/09/15 0056)  diphenhydrAMINE (BENADRYL) injection 25 mg (25 mg Intravenous Given 03/09/15 0058)  methylPREDNISolone sodium succinate (SOLU-MEDROL) 125 mg/2 mL injection 125 mg (125 mg Intravenous Given 03/09/15 0058)  magnesium sulfate IVPB 2 g 50 mL (0 g Intravenous Stopped 03/09/15 0202)  ketorolac (TORADOL) 15 MG/ML injection 15 mg (15 mg Intravenous Given 03/09/15 0141)  ketorolac (TORADOL) 15 MG/ML injection 15 mg (15 mg Intravenous Given 03/09/15 0202)    Penny [name] is a pleasant 34 y.o. female presenting with HA. Presentation is like pts typical HA and non concerning for [facility], ICH, Meningitis, or temporal arteritis. Pt is afebrile with  no focal neuro deficits, nuchal rigidity, or change in vision. Pt is to follow up with PCP to discuss prophylactic medication. Pt verbalizes understanding and is agreeable with plan to dc.  Evaluation does not show pathology that would require ongoing emergent intervention or inpatient treatment. Pt is hemodynamically stable and mentating appropriately. Discussed findings and plan with patient/guardian, who agrees with care plan. All questions answered. Return precautions discussed and outpatient follow up given.   Discharge Medication List as of 03/09/2015  1:38 AM    START taking these medications   Details  butalbital-acetaminophen-caffeine (FIORICET) 50-325-40 MG per tablet Take 1 tablet by mouth every 6 (six) hours as needed for headache., Starting 03/09/2015, Until Discontinued, [employer], PA-C 03/09/15 0243  Lacretia Leigh, MD 03/10/15 [phone]

## 2015-03-08 NOTE — ED Notes (Signed)
Pt c/o migraine (hx migraines). Attempted taking Ibuprofen 800 mg, 50 Benadryl, Headache med (acetaminophen, caffeine, aspirin)-with slight alleviation of symptoms. Took all meds more than 6 hours ago. This migraine started last Sunday. Was seen here last month for similar situation. Endorses slight blurry vision. Has had nausea but denies vomiting or diarrhea. Neurologically intact. A&Ox4. Moving all extremities equally.

## 2015-03-09 LAB — I-STAT CHEM 8, ED
BUN: 5 mg/dL — ABNORMAL LOW (ref 6–23)
Calcium, Ion: 1.18 mmol/L (ref 1.12–1.23)
Chloride: 106 mmol/L (ref 96–112)
Creatinine, Ser: 0.6 mg/dL (ref 0.50–1.10)
Glucose, Bld: 95 mg/dL (ref 70–99)
HCT: 46 % (ref 36.0–46.0)
Hemoglobin: 15.6 g/dL — ABNORMAL HIGH (ref 12.0–15.0)
Potassium: 3.5 mmol/L (ref 3.5–5.1)
Sodium: 142 mmol/L (ref 135–145)
TCO2: 21 mmol/L (ref 0–100)

## 2015-03-09 LAB — I-STAT BETA HCG BLOOD, ED (MC, WL, AP ONLY): I-stat hCG, quantitative: <5 m[IU]/mL (ref <5)

## 2015-03-09 MED ORDER — KETOROLAC TROMETHAMINE 15 MG/ML IJ SOLN
15.0000 mg | Freq: Once | INTRAMUSCULAR | Status: AC
  Administered 2015-03-09: 15 mg via INTRAVENOUS
  Filled 2015-03-09: qty 1

## 2015-03-09 MED ORDER — BUTALBITAL-APAP-CAFFEINE 50-325-40 MG PO TABS: 1.0000 | ORAL_TABLET | Freq: Four times a day (QID) | ORAL | Status: DC | PRN

## 2015-03-09 NOTE — Discharge Instructions (Signed)
Do not hesitate to return to the emergency room for any new, worsening or concerning symptoms. ° °Please obtain primary care using resource guide below. Let them know that you were seen in the emergency room and that they will need to obtain records for further outpatient management. ° ° ° °Emergency Department Resource Guide °1) Find a Doctor and Pay Out of Pocket °Although you won't have to find out who is covered by your insurance plan, it is a good idea to ask around and get recommendations. You will then need to call the office and see if the doctor you have chosen will accept you as a new patient and what types of options they offer for patients who are self-pay. Some doctors offer discounts or will set up payment plans for their patients who do not have insurance, but you will need to ask so you aren't surprised when you get to your appointment. ° °2) Contact Your Local Health Department °Not all health departments have doctors that can see patients for sick visits, but many do, so it is worth a call to see if yours does. If you don't know where your local health department is, you can check in your phone book. The CDC also has a tool to help you locate your state's health department, and many state websites also have listings of all of their local health departments. ° °3) Find a Walk-in Clinic °If your illness is not likely to be very severe or complicated, you may want to try a walk in clinic. These are popping up all over the country in pharmacies, drugstores, and shopping centers. They're usually staffed by nurse practitioners or physician assistants that have been trained to treat common illnesses and complaints. They're usually fairly quick and inexpensive. However, if you have serious medical issues or chronic medical problems, these are probably not your best option. ° °No Primary Care Doctor: °- Call [facility] at  832-8000 - they can help you locate a primary care doctor that  accepts your  insurance, provides certain services, etc. °- Physician Referral Service- 1-800-533-3463 ° °Chronic Pain Problems: °Organization         Address  Phone   Notes  °[facility]  (336) [phone] Patients need to be referred by their primary care doctor.  ° °Medication Assistance: °Organization         Address  Phone   Notes  °[place] [facility] [address] °[place],  [address] (336) 641-8030 --Must be a resident of [place] °-- Must have NO insurance coverage whatsoever (no Medicaid/ Medicare, etc.) °-- The pt. MUST have a primary care doctor that directs their care regularly and follows them in the community °  °[facility]  (866) 331-1348   °[employer]  (888) 892-1162   ° °Agencies that provide inexpensive medical care: °Organization         Address  Phone   Notes  °[facility]  (336) 832-8035   ° Internal Medicine    (336) 832-7272   °[facility] [address] °[place],  [address] (336) 832-4777   °[facility] [address], °[place] (336) 271-4999   °[facility]    (336) 373-0678   °[facility]    (336) 272-1050   °[facility] ° [address], [place] Phone:  (336) 832-4444, Fax:  (336) 832-4440 Hours of Operation:  9 am -   6 pm, M-F.  Also accepts Medicaid/Medicare and self-pay.  °[facility] ° [address], [place] Phone: (336) 832-3150, Fax: (336) [phone]. Hours of Operation:  8:30 am - 5:30 pm, M-F.  Also accepts Medicaid and self-pay.  °[facility] [place] [address], [place] Phone: (336) 878-6027   °[facility] [address], [place], [place] (336)723-1848, Ext. 123 Mondays & Thursdays: 7-9 AM.  First 15 patients are seen on a first come, first serve basis. °  ° °Medicaid-accepting [place] Providers: ° °Organization          Address  Phone   Notes  °[facility] [address], [place] (336) 641-2100 Also accepts self-pay patients.  °[facility] [address], [place] ° (336) 856-9996   °[facility] [address], [place] (336) 288-8857   °[facility] [address] [place] [address], [place] (336) 299-7000   °Veita Bland [address], [place]  ° (336) 373-1557 Only accepts [place] Access Medicaid patients after they have their name applied to their card.  ° °Self-Pay (no insurance) in [place]: ° °Organization         Address  Phone   Notes  °Sickle Cell Patients, [facility] [address], [place] (336) 832-1970   °[facility] [address], [place] (336) 832-4400   °[facility] ° [address], [place] (336) 992-4800   °[facility]/Dr. Osei-Bonsu ° [address] [place] [address], [place] or [address], [place] (336) [phone] Phone number for both [place] and [place] locations is the same.  °[facility] [address], [place] (336) 299-0000   °[facility] [address] [place] [address], [place] or [address] (336) 852-7530 °(336) 878-2260   °[facility] [address], [place] (336) 350-1642, phone; (336) 294-5005, fax Sees patients 1st and 3rd Saturday of every month.  Must not qualify for public or private insurance (i.e. Medicaid, Medicare, [place] Health Choice, Veterans' Benefits) • Household income should be no more than 200% of the poverty level •The clinic cannot treat you if you are pregnant or think you are pregnant • Sexually transmitted diseases are not treated at the clinic.  ° ° °Dental Care: °Organization         Address  Phone  Notes  °[facility] [address],  [place] (336) 641-6152 Accepts children up to age 21 who are enrolled in Medicaid or [place] Health Choice; pregnant women with a Medicaid card; and children who have applied for Medicaid or [place] Health Choice, but were declined, whose parents can pay a reduced fee at time of service.  °[facility] [place]  [address], [place] (336) 641-7733 Accepts children up to age 21 who are enrolled in Medicaid or [place] Health Choice; pregnant women with a Medicaid card; and children who have applied for Medicaid or [place] Health Choice, but were declined, whose parents can pay a reduced fee at time of service.  °[facility] ° [address], [place] (336) 641-4533 Patients are seen by appointment only. Walk-ins are not accepted. [facility] will see patients 18 years of age and   older. °Monday - Tuesday (8am-5pm) °Most Wednesdays (8:30-5pm) °$30 per visit, cash only  °[facility] ° [address], [place] (336) 641-4533 Patients are seen by appointment only. Walk-ins are not accepted. [facility] will see patients 18 years of age and older. °One Wednesday Evening (Monthly: Volunteer Based).  $30 per visit, cash only  °[facility]  (919) 537-3737 for adults; Children under age 4, call Graduate Pediatric Dentistry at ([phone]) [phone]. Children aged 4-14, please call (919) [phone] to request a pediatric application. ° Dental services are provided in all areas of dental care including fillings, crowns and bridges, complete and partial dentures, implants, gum treatment, root canals, and extractions. Preventive care is also provided. Treatment is provided to both adults and children. °Patients are selected via a lottery and there is often a waiting list. °  °[facility] [address], °[place] ° (336) 763-8833 [email or web address] °  °[facility] [address], [place], [place]  (336)723-1848, Ext. 123 Second and Fourth Thursday of each month, opens at 6:30 AM; Clinic ends at 9 AM.  Patients are seen on a first-come first-served basis, and a limited number are seen during each clinic.  ° °[facility] ° [address], [place], [place] (336) 723-7904   Eligibility Requirements °You must have lived in [place], [place], or [place] counties for at least the last three months. °  You cannot be eligible for state or federal sponsored healthcare insurance, including Veterans Administration, Medicaid, or Medicare. °  You generally cannot be eligible for healthcare insurance through your employer.  °  How to apply: °Eligibility screenings are held every Tuesday and Wednesday afternoon from 1:00 pm until 4:00 pm. You do not need an appointment for the interview!  °[facility] [address], [place], [place] 336-631-2330   °[facility]  336-342-8273   °[facility]  336-703-3100   °[facility]  336-570-6415   ° °Behavioral Health Resources in the Community: °Intensive Outpatient Programs °Organization         Address  Phone  Notes  °[facility] [address], [place], [place] 336-878-6098   °[facility] [address], [place], [place] 336-832-9800   °[facility] [address], [place], [place] ° 336-882-2125   °[facility] [address],  °[place], [place] 1-800-853-5163 or 336-641-4981   °Substance Abuse Resources °Organization         Address  Phone  Notes  °[facility]  336-882-2125   °[facility]  336-784-9470   °[facility]  336-285-9073   °[facility]  336-845-3988   °Residential & Outpatient Substance Abuse Program  1-800-659-3381   °Psychological Services °Organization         Address  Phone  Notes  °[facility]  336- 832-9600   °[facility]  336- 378-7881    °[facility] [address], [place] 1-800-853-5163 or 336-641-4981   ° °Mobile Crisis Teams °Organization         Address  Phone  Notes  °[facility], Mobile Crisis Care Unit  1-877-626-1772   °Assertive °[facility] ° [address] [place], [place] 336-834-9664   °Sharon DeEsch [address] °  336-554-5454   ° °Self-Help/Support Groups °Organization         Address    Phone             Notes  °[facility] - variety of support groups  336- 373-1402 Call for more information  °Narcotics Anonymous (NA), [facility] [address], °[place]  2 meetings at this location  ° °Residential Treatment Programs °Organization         Address  Phone  Notes  °[facility] [address],    °[place]  1-866-801-8205   °[facility] ° [address], [place], [place] 704-293-8524   °[facility] [address], [place] 336-845-3988 Admissions: 8am-3pm M-F  °[facility] [address]    °[place], [place] 336-841-1104   °[facility] [address], [place], [place] 336-379-7146   °[facility] [address]  °[place], [place] 336-285-9073   °[facility] - Intensive Outpatient [address], [place], [place] 336-852-3033   °[facility] [address]  °[place], [place] 1-877-615-2722 or 336-784-9470   °[facility] [address] [place], [place] 336-227-7417 Accepts Medicaid  °[facility] [address]  °[place] 1-800-659-3381 Substance Abuse/Addiction Treatment  ° °[place] Behavioral Health Resources °Organization         Address  Phone  Notes  °[facility]  (888) 581-9988   °Julie Brannon, PhD [address] [place], [place]   (336) 349-5553 or (336) 951-0000   °[facility]   [address] °[place], [place] (336) 349-4454   °[facility] [address], [place], [place] (336) 342-8316 Insurance/Medicaid/sponsorship through [facility]  °[facility] [address]                                    [place], [place] (336) 342-8316 Therapy/tele-psych/case  °[facility] [address]  ° [place],  (336) 349-2233    °Dr. Arfeen  (336) 349-4544   °[facility] [place]  [employer] [place] [facility] 1) [address],  °2) [address], [place] °3)  [address], [place] (336) 349-3220 °(336) 342-7768 ° °(336) 342-8140   °[place] [facility] (336) 342-1394 or (336) 342-3537 (After Hours)    ° ° ° °

## 2015-03-18 ENCOUNTER — Emergency Department (HOSPITAL_COMMUNITY)
Admission: EM | Admit: 2015-03-18 | Discharge: 2015-03-18 | Disposition: A | Discharge status: Home / Self Care | Payer: Medicaid Other | Attending: Emergency Medicine | Admitting: Emergency Medicine

## 2015-03-18 ENCOUNTER — Emergency Department (HOSPITAL_COMMUNITY): Payer: Medicaid Other

## 2015-03-18 ENCOUNTER — Encounter (HOSPITAL_COMMUNITY): Payer: Self-pay | Admitting: Emergency Medicine

## 2015-03-18 DIAGNOSIS — Z720 Tobacco use: Secondary | ICD-10-CM | POA: Insufficient documentation

## 2015-03-18 DIAGNOSIS — Z793 Long term (current) use of hormonal contraceptives: Secondary | ICD-10-CM | POA: Diagnosis not present

## 2015-03-18 DIAGNOSIS — Z3202 Encounter for pregnancy test, result negative: Secondary | ICD-10-CM | POA: Insufficient documentation

## 2015-03-18 DIAGNOSIS — R002 Palpitations: Secondary | ICD-10-CM | POA: Diagnosis not present

## 2015-03-18 DIAGNOSIS — Z8679 Personal history of other diseases of the circulatory system: Secondary | ICD-10-CM | POA: Diagnosis not present

## 2015-03-18 DIAGNOSIS — R0789 Other chest pain: Secondary | ICD-10-CM | POA: Diagnosis not present

## 2015-03-18 DIAGNOSIS — R079 Chest pain, unspecified: Secondary | ICD-10-CM | POA: Diagnosis present

## 2015-03-18 DIAGNOSIS — Z8659 Personal history of other mental and behavioral disorders: Secondary | ICD-10-CM | POA: Insufficient documentation

## 2015-03-18 DIAGNOSIS — Z8739 Personal history of other diseases of the musculoskeletal system and connective tissue: Secondary | ICD-10-CM | POA: Insufficient documentation

## 2015-03-18 DIAGNOSIS — J45909 Unspecified asthma, uncomplicated: Secondary | ICD-10-CM | POA: Diagnosis not present

## 2015-03-18 LAB — BASIC METABOLIC PANEL
Anion gap: 10 (ref 5–15)
BUN: 9 mg/dL (ref 6–20)
CO2: 23 mmol/L (ref 22–32)
Calcium: 9.3 mg/dL (ref 8.9–10.3)
Chloride: 105 mmol/L (ref 101–111)
Creatinine, Ser: 0.54 mg/dL (ref 0.44–1.00)
GFR calc Af Amer: >60 mL/min (ref >60)
GFR calc non Af Amer: >60 mL/min (ref >60)
Glucose, Bld: 119 mg/dL — ABNORMAL HIGH (ref 70–99)
Potassium: 3.7 mmol/L (ref 3.5–5.1)
Sodium: 138 mmol/L (ref 135–145)

## 2015-03-18 LAB — CBC
HCT: 41.9 % (ref 36.0–46.0)
Hemoglobin: 14.6 g/dL (ref 12.0–15.0)
MCH: 29.6 pg (ref 26.0–34.0)
MCHC: 34.8 g/dL (ref 30.0–36.0)
MCV: 85 fL (ref 78.0–100.0)
Platelets: 308 10*3/uL (ref 150–400)
RBC: 4.93 MIL/uL (ref 3.87–5.11)
RDW: 13.8 % (ref 11.5–15.5)
WBC: 13.1 10*3/uL — ABNORMAL HIGH (ref 4.0–10.5)

## 2015-03-18 LAB — POC URINE PREG, ED: Preg Test, Ur: NEGATIVE

## 2015-03-18 LAB — I-STAT TROPONIN, ED: Troponin i, poc: 0.01 ng/mL (ref 0.00–0.08)

## 2015-03-18 LAB — BRAIN NATRIURETIC PEPTIDE: B Natriuretic Peptide: 15.4 pg/mL (ref 0.0–100.0)

## 2015-03-18 LAB — D-DIMER, QUANTITATIVE: D-Dimer, Quant: <0.27 ug/mL-FEU (ref 0.00–0.48)

## 2015-03-18 MED ORDER — DICLOFENAC POTASSIUM 25 MG PO CAPS: 1.0000 | ORAL_CAPSULE | Freq: Two times a day (BID) | ORAL | Status: DC

## 2015-03-18 MED ORDER — DICLOFENAC SODIUM 75 MG PO TBEC
75.0000 mg | DELAYED_RELEASE_TABLET | Freq: Once | ORAL | Status: AC
  Administered 2015-03-18: 75 mg via ORAL
  Filled 2015-03-18: qty 1

## 2015-03-18 MED ORDER — KETOROLAC TROMETHAMINE 60 MG/2ML IM SOLN
60.0000 mg | Freq: Once | INTRAMUSCULAR | Status: DC
  Filled 2015-03-18: qty 2

## 2015-03-18 MED ORDER — DICYCLOMINE HCL 10 MG/ML IM SOLN
20.0000 mg | Freq: Once | INTRAMUSCULAR | Status: AC
  Administered 2015-03-18: 20 mg via INTRAMUSCULAR
  Filled 2015-03-18: qty 2

## 2015-03-18 MED ORDER — GI COCKTAIL ~~LOC~~
30.0000 mL | Freq: Once | ORAL | Status: AC
  Administered 2015-03-18: 30 mL via ORAL
  Filled 2015-03-18: qty 30

## 2015-03-18 NOTE — Discharge Instructions (Signed)

## 2015-03-18 NOTE — ED Notes (Signed)
Pt stable, ambulatory, pain decreased to 4/10, states understanding of discharge instructions

## 2015-03-18 NOTE — ED Provider Notes (Signed)
CSN: [id number]     Arrival date & time 03/18/15  0240 History  This chart was scribed for [name], MD by Rayfield Citizen, ED Scribe. This patient was seen in room A06C/A06C and the patient's care was started at 1:57 AM.     Chief Complaint  Patient presents with  . Chest Pain   Patient is a 34 y.o. female presenting with chest pain and palpitations. The history is provided by the patient. No language interpreter was used.  Chest Pain Pain location:  Substernal area Pain quality: sharp   Pain radiates to:  Does not radiate Pain radiates to the back: no   Pain severity:  Moderate Onset quality:  Sudden Timing:  Constant Progression:  Improving Chronicity:  New Context: at rest   Relieved by:  Nothing Worsened by:  Nothing tried Ineffective treatments:  None tried Associated symptoms: palpitations   Associated symptoms: no cough and no shortness of breath   Risk factors: not female   Palpitations Palpitations quality:  Fast Onset quality:  Sudden Timing:  Constant Progression:  Improving Chronicity:  New Context: not anxiety   Relieved by:  Nothing Worsened by:  Nothing Ineffective treatments:  None tried Associated symptoms: chest pain   Associated symptoms: no cough and no shortness of breath   Risk factors: no diabetes mellitus, no heart disease, no hx of atrial fibrillation, no hx of DVT, no hx of PE, no hx of thyroid disease and no stress     HPI Comments: Penny [name] is a 34 y.o. female who presents to the Emergency Department via EMS complaining of heart palpitations. Per EMS, patient experienced nonradiating chest pain, exacerbated with deep breathing, and SOB while attempting to fall asleep tonight. Patient explains that her heart was "racing, 110 bpm" so she called the nurse on-call and was directed to come to the ED. She had eaten approximately 1 hour before symptom onset. She was given 324mg  aspirin en route. She denies anxiety, cough, congestion, denies any  recent long-term periods of immobility (e.g. no plane or car trips over 6-8 hours).   Past Medical History  Diagnosis Date  . Preterm labor     c/s at 36 wks  . Placenta previa     hx and with current pregnancy  . Migraines     last one over 1 month ago  . Anxiety     no meds  . Depression     no meds  . Asthma   . ADHD (attention deficit hyperactivity disorder)   . Scoliosis    Past Surgical History  Procedure Laterality Date  . Cesarean section  2010    x 1 at 36 wks in [place]  . Svd  2003    x 1 in [place]  . Wisdom tooth extraction    . Tubal ligation     Family History  Problem Relation Age of Onset  . Cancer Mother   . Hypertension Sister   . Sickle cell trait Daughter   . Asthma Son   . Asthma Paternal Grandmother   . Pseudochol deficiency Neg Hx   . Malignant hyperthermia Neg Hx   . Anesthesia problems Neg Hx   . Hypotension Neg Hx    History  Substance Use Topics  . Smoking status: Current Every Day Smoker -- 0.20 packs/day for 12 years    Types: Cigarettes  . Smokeless tobacco: Never Used  . Alcohol Use: No     Comment: on occ   OB History  Gravida Para Term Preterm AB TAB SAB Ectopic Multiple Living   3 3 1 2  0 0 0 0 0 3     Review of Systems  HENT: Negative for congestion.   Respiratory: Negative for cough and shortness of breath.   Cardiovascular: Positive for chest pain and palpitations. Negative for leg swelling.  Psychiatric/Behavioral: The patient is not nervous/anxious.   All other systems reviewed and are negative.   Allergies  Review of patient's allergies indicates no known allergies.  Home Medications   Prior to Admission medications   Medication Sig Start Date End Date Taking? Authorizing Provider  aspirin-acetaminophen-caffeine (EXCEDRIN MIGRAINE) [phone] MG per tablet Take 2 tablets by mouth every 6 (six) hours as needed for headache.    Historical Provider, MD  butalbital-acetaminophen-caffeine (FIORICET) 50-325-40 MG  per tablet Take 1 tablet by mouth every 6 (six) hours as needed for headache. 03/09/15   Monico Blitz, PA-C  levonorgestrel (MIRENA) 20 MCG/24HR IUD 1 each by Intrauterine route continuous.     Historical Provider, MD   BP 103/52 mmHg  Temp(Src) 98.8 F (37.1 C) (Oral)  Resp 21  Ht 5\' 4"  (1.626 m)  Wt 180 lb (81.647 kg)  BMI 30.88 kg/m2  LMP 02/25/2015 (Approximate) Physical Exam  Constitutional: She is oriented to person, place, and time. She appears well-developed and well-nourished. No distress.  HENT:  Head: Normocephalic and atraumatic.  Mouth/Throat: Oropharynx is clear and moist. No oropharyngeal exudate.  Moist mucous membranes  Eyes: EOM are normal. Pupils are equal, round, and reactive to light.  Neck: Normal range of motion. Neck supple. No JVD present.  Cardiovascular: Normal rate, regular rhythm and normal heart sounds.  Exam reveals no gallop and no friction rub.   No murmur heard. Pulmonary/Chest: Effort normal and breath sounds normal. No respiratory distress. She has no wheezes. She has no rales. She exhibits tenderness.  Abdominal: Soft. She exhibits no mass. Bowel sounds are increased. There is no tenderness. There is no rebound and no guarding.  Musculoskeletal: Normal range of motion. She exhibits no edema.  Moves all extremities normally.   Lymphadenopathy:    She has no cervical adenopathy.  Neurological: She is alert and oriented to person, place, and time. She has normal reflexes. She displays normal reflexes.  Skin: Skin is warm and dry. No rash noted. She is not diaphoretic.  Psychiatric: She has a normal mood and affect. Her behavior is normal.  Nursing note and vitals reviewed.   ED Course  Procedures   DIAGNOSTIC STUDIES: Oxygen Saturation is 98% on [place] 2L/min, normal by my interpretation.    COORDINATION OF CARE: 2:04 AM Discussed treatment plan with pt at bedside and pt agreed to plan.   Labs Review Labs Reviewed  CBC - Abnormal; Notable  for the following:    WBC 13.1 (*)    All other components within normal limits  BASIC METABOLIC PANEL - Abnormal; Notable for the following:    Glucose, Bld 119 (*)    All other components within normal limits  BRAIN NATRIURETIC PEPTIDE  D-DIMER, QUANTITATIVE  I-STAT TROPOININ, ED  POC URINE PREG, ED    Imaging Review Dg Chest Port 1 View  03/18/2015   CLINICAL DATA:  Chest pain and dyspnea  EXAM: PORTABLE CHEST - 1 VIEW  COMPARISON:  01/05/2015  FINDINGS: There is moderate thoracic scoliosis. Hilar, mediastinal and cardiac contours are unremarkable and unchanged. The lungs are clear. There is no large effusion. Pulmonary vasculature is normal.  IMPRESSION: No  acute cardiopulmonary findings.   Electronically Signed   By: Andreas Newport M.D.   On: 03/18/2015 01:19     EKG Interpretation   Date/Time:  Wednesday Mar 18 2015 01:00:41 EDT Ventricular Rate:  96 PR Interval:  192 QRS Duration: 83 QT Interval:  358 QTC Calculation: 452 R Axis:   53 Text Interpretation:  Sinus rhythm Benign early repolarization Confirmed  by [facility]  MD, Raimundo Corbit ([id number]) on 03/18/2015 1:32:10 AM      MDM   Final diagnoses:  None   Ruled out for ACS and PE.  Suspect when pain started patient became anxious causing HR to increase.  Patient is experiencing costochondritis.  Will treat with NSAIDS and heat and close follow up    I personally performed the services described in this documentation, which was scribed in my presence. The recorded information has been reviewed and is accurate.       Veatrice Kells, MD 03/18/15 [phone]

## 2015-03-18 NOTE — ED Notes (Signed)
Per EMS, patient has chest pain that started when she tried to go to sleep. Non-radiating. Also reporting shortness of breath. Heart rate was 120 on EMS arrival, currently 100. 12 lead unremarkable, 98% on room air, pain increases with deep breathing. Denies anxiety. 324mg  of aspirin given. Pain 7/10, BP 135/98, p 109.

## 2015-05-21 ENCOUNTER — Emergency Department (HOSPITAL_COMMUNITY)
Admission: EM | Admit: 2015-05-21 | Discharge: 2015-05-21 | Disposition: A | Discharge status: Home / Self Care | Payer: Medicaid Other | Attending: Emergency Medicine | Admitting: Emergency Medicine

## 2015-05-21 ENCOUNTER — Encounter (HOSPITAL_COMMUNITY): Payer: Self-pay | Admitting: Emergency Medicine

## 2015-05-21 DIAGNOSIS — M436 Torticollis: Secondary | ICD-10-CM

## 2015-05-21 DIAGNOSIS — M419 Scoliosis, unspecified: Secondary | ICD-10-CM | POA: Diagnosis not present

## 2015-05-21 DIAGNOSIS — Z8659 Personal history of other mental and behavioral disorders: Secondary | ICD-10-CM | POA: Diagnosis not present

## 2015-05-21 DIAGNOSIS — J45909 Unspecified asthma, uncomplicated: Secondary | ICD-10-CM | POA: Diagnosis not present

## 2015-05-21 DIAGNOSIS — Z720 Tobacco use: Secondary | ICD-10-CM | POA: Diagnosis not present

## 2015-05-21 DIAGNOSIS — Z791 Long term (current) use of non-steroidal anti-inflammatories (NSAID): Secondary | ICD-10-CM | POA: Diagnosis not present

## 2015-05-21 DIAGNOSIS — M542 Cervicalgia: Secondary | ICD-10-CM | POA: Diagnosis present

## 2015-05-21 DIAGNOSIS — G43909 Migraine, unspecified, not intractable, without status migrainosus: Secondary | ICD-10-CM | POA: Diagnosis not present

## 2015-05-21 DIAGNOSIS — Z8751 Personal history of pre-term labor: Secondary | ICD-10-CM | POA: Diagnosis not present

## 2015-05-21 MED ORDER — DIAZEPAM 5 MG PO TABS: 5.0000 mg | ORAL_TABLET | Freq: Three times a day (TID) | ORAL | Status: DC | PRN

## 2015-05-21 MED ORDER — OXYCODONE-ACETAMINOPHEN 5-325 MG PO TABS
1.0000 | ORAL_TABLET | Freq: Once | ORAL | Status: AC
  Administered 2015-05-21: 1 via ORAL
  Filled 2015-05-21: qty 1

## 2015-05-21 MED ORDER — NAPROXEN 500 MG PO TABS: 500.0000 mg | ORAL_TABLET | Freq: Two times a day (BID) | ORAL | Status: DC

## 2015-05-21 MED ORDER — DIAZEPAM 5 MG PO TABS
5.0000 mg | ORAL_TABLET | Freq: Once | ORAL | Status: AC
  Administered 2015-05-21: 5 mg via ORAL
  Filled 2015-05-21: qty 1

## 2015-05-21 NOTE — ED Notes (Signed)
PA at the bedside.

## 2015-05-21 NOTE — ED Provider Notes (Signed)
CSN: [id number]     Arrival date & time 05/21/15  1750 History  This chart was scribed for non-physician provider Jeannett Senior, PA-C, working with Ernestina Patches, MD by Irene Pap, ED Scribe. This patient was seen in room TR10C/TR10C and patient care was started at 7:53 PM.   Chief Complaint  Patient presents with  . Neck Pain   The history is provided by the patient. No language interpreter was used.  HPI Comments: Penny [name] is a 34 y.o. female who presents to the Emergency Department complaining of left sided neck pain onset earlier this morning. States that she woke up with the pain that radiates across the upper back, base of head, and to the left shoulder and has not had pain this severe before. She reports using heat on the area and taking 800 mg ibuprofen to no relief. Reports worsening pain with neck ROM and headache from the pain. She denies fever, chills, numbness, weakness,  or any other symptoms.   Past Medical History  Diagnosis Date  . Preterm labor     c/s at 36 wks  . Placenta previa     hx and with current pregnancy  . Migraines     last one over 1 month ago  . Anxiety     no meds  . Depression     no meds  . Asthma   . ADHD (attention deficit hyperactivity disorder)   . Scoliosis    Past Surgical History  Procedure Laterality Date  . Cesarean section  2010    x 1 at 36 wks in [place]  . Svd  2003    x 1 in [place]  . Wisdom tooth extraction    . Tubal ligation     Family History  Problem Relation Age of Onset  . Cancer Mother   . Hypertension Sister   . Sickle cell trait Daughter   . Asthma Son   . Asthma Paternal Grandmother   . Pseudochol deficiency Neg Hx   . Malignant hyperthermia Neg Hx   . Anesthesia problems Neg Hx   . Hypotension Neg Hx    History  Substance Use Topics  . Smoking status: Current Every Day Smoker -- 0.20 packs/day for 12 years    Types: Cigarettes  . Smokeless tobacco: Never Used  . Alcohol Use: No      Comment: on occ   OB History    Gravida Para Term Preterm AB TAB SAB Ectopic Multiple Living   3 3 1 2  0 0 0 0 0 3     Review of Systems  Constitutional: Negative for fever and chills.  Musculoskeletal: Positive for neck pain and neck stiffness.  Neurological: Negative for dizziness, seizures, weakness, light-headedness, numbness and headaches.  All other systems reviewed and are negative.  Allergies  Review of patient's allergies indicates no known allergies.  Home Medications   Prior to Admission medications   Medication Sig Start Date End Date Taking? Authorizing Provider  aspirin-acetaminophen-caffeine (EXCEDRIN MIGRAINE) [phone] MG per tablet Take 2 tablets by mouth every 6 (six) hours as needed for headache.    Historical Provider, MD  butalbital-acetaminophen-caffeine (FIORICET) 50-325-40 MG per tablet Take 1 tablet by mouth every 6 (six) hours as needed for headache. 03/09/15   Elmyra Ricks Pisciotta, PA-C  Diclofenac Potassium 25 MG CAPS Take 1 capsule (25 mg total) by mouth 2 (two) times daily. 03/18/15   April Palumbo, MD  levonorgestrel (MIRENA) 20 MCG/24HR IUD 1 each by Intrauterine route continuous.  Historical Provider, MD   BP 117/66 mmHg  Pulse 72  Temp(Src) 98.1 F (36.7 C) (Oral)  Resp 18  Wt 180 lb (81.647 kg)  SpO2 100%  LMP 04/29/2015   Physical Exam  Constitutional: She is oriented to person, place, and time. She appears well-developed and well-nourished.  HENT:  Head: Normocephalic and atraumatic.  Right Ear: External ear normal.  Left Ear: External ear normal.  Nose: Nose normal.  Mouth/Throat: Oropharynx is clear and moist.  Ear canals and TMs are normal bilaterally  Eyes: EOM are normal.  Neck: Normal range of motion. Neck supple.  Tender to palpation over left trapezius muscle from the left space of the skull into the left shoulder. Pain with rotation of the head to the right. Full range of motion of the neck. Midline cervical spine tenderness.   Cardiovascular: Normal rate.   Pulmonary/Chest: Effort normal.  Musculoskeletal: Normal range of motion.  Neurological: She is alert and oriented to person, place, and time.  5/5 and equal ue strength bilaterally. Grip 5/5 and equal   Skin: Skin is warm and dry.  Psychiatric: She has a normal mood and affect. Her behavior is normal.  Nursing note and vitals reviewed.   ED Course  Procedures (including critical care time) DIAGNOSTIC STUDIES: Oxygen Saturation is 100% on RA, normal by my interpretation.    COORDINATION OF CARE: 7:55 PM-Discussed treatment plan which includes pain medication, muscle relaxants, and heating pad with pt at bedside and pt agreed to plan.   Labs Review Labs Reviewed - No data to display  Imaging Review No results found.   EKG Interpretation None      MDM   Final diagnoses:  Torticollis     patient emergency department complaining of left-sided neck pain that started this morning after she woke up. No neurological symptoms. No visual changes, no numbness or weakness in her hands, no fever. She has full range of motion of the neck. She is tender over left trapezius muscle, pain reproducible with palpation and with movement of the head to the right. Most likely a muscular strain, pressure: Rest. Will treat with muscle relaxant and NSAIDs. Follow-up with primary care doctor.  valium and naproxen prescription given   . Filed Vitals:   05/21/15 1851 05/21/15 1909  BP: 118/73 117/66  Pulse: 77 72  Temp: 98.4 F (36.9 C) 98.1 F (36.7 C)  TempSrc: Oral Oral  Resp: 18 18  Weight:  180 lb (81.647 kg)  SpO2: 98% 100%   I personally performed the services described in this documentation, which was scribed in my presence. The recorded information has been reviewed and is accurate.   Jeannett Senior, PA-C 05/21/15 2003  Ernestina Patches, MD 05/23/15 [phone]

## 2015-05-21 NOTE — Discharge Instructions (Signed)
Take naprosyn as prescribed as needed for pain. Valium for muscle spasms. Try heating pads. Stretches. Follow up with family doctor in 3 days if not improving.   Torticollis, Acute You have suddenly (acutely) developed a twisted neck (torticollis). This is usually a self-limited condition. CAUSES  Acute torticollis may be caused by malposition, trauma or infection. Most commonly, acute torticollis is caused by sleeping in an awkward position. Torticollis may also be caused by the flexion, extension or twisting of the neck muscles beyond their normal position. Sometimes, the exact cause may not be known. SYMPTOMS  Usually, there is pain and limited movement of the neck. Your neck may twist to one side. DIAGNOSIS  The diagnosis is often made by physical examination. X-rays, CT scans or MRIs may be done if there is a history of trauma or concern of infection. TREATMENT  For a common, stiff neck that develops during sleep, treatment is focused on relaxing the contracted neck muscle. Medications (including shots) may be used to treat the problem. Most cases resolve in several days. Torticollis usually responds to conservative physical therapy. If left untreated, the shortened and spastic neck muscle can cause deformities in the face and neck. Rarely, surgery is required. HOME CARE INSTRUCTIONS   Use over-the-counter and prescription medications as directed by your caregiver.  Do stretching exercises and massage the neck as directed by your caregiver.  Follow up with physical therapy if needed and as directed by your caregiver. SEEK IMMEDIATE MEDICAL CARE IF:   You develop difficulty breathing or noisy breathing (stridor).  You drool, develop trouble swallowing or have pain with swallowing.  You develop numbness or weakness in the hands or feet.  You have changes in speech or vision.  You have problems with urination or bowel movements.  You have difficulty walking.  You have a  fever.  You have increased pain. MAKE SURE YOU:   Understand these instructions.  Will watch your condition.  Will get help right away if you are not doing well or get worse. Document Released: 10/28/2000 Document Revised: 01/23/2012 Document Reviewed: 12/09/2009 [facility] Patient Information 2015 [place], [place]. This information is not intended to replace advice given to you by your health care provider. Make sure you discuss any questions you have with your health care provider.

## 2015-05-21 NOTE — ED Notes (Addendum)
Pt st's she started having pain in left side of neck this am when she woke up.  St's pain goes into her head and down to upper back,.  Pt st's unable to turn head.  Pt st's she has taken Motrin without relief

## 2015-06-01 ENCOUNTER — Encounter (HOSPITAL_COMMUNITY): Payer: Self-pay | Admitting: Emergency Medicine

## 2015-06-01 ENCOUNTER — Emergency Department (HOSPITAL_COMMUNITY)
Admission: EM | Admit: 2015-06-01 | Discharge: 2015-06-01 | Disposition: A | Discharge status: Home / Self Care | Payer: Medicaid Other | Attending: Emergency Medicine | Admitting: Emergency Medicine

## 2015-06-01 DIAGNOSIS — G43909 Migraine, unspecified, not intractable, without status migrainosus: Secondary | ICD-10-CM | POA: Diagnosis not present

## 2015-06-01 DIAGNOSIS — F419 Anxiety disorder, unspecified: Secondary | ICD-10-CM | POA: Insufficient documentation

## 2015-06-01 DIAGNOSIS — S70261A Insect bite (nonvenomous), right hip, initial encounter: Secondary | ICD-10-CM | POA: Diagnosis present

## 2015-06-01 DIAGNOSIS — W57XXXA Bitten or stung by nonvenomous insect and other nonvenomous arthropods, initial encounter: Secondary | ICD-10-CM | POA: Diagnosis not present

## 2015-06-01 DIAGNOSIS — Z791 Long term (current) use of non-steroidal anti-inflammatories (NSAID): Secondary | ICD-10-CM | POA: Insufficient documentation

## 2015-06-01 DIAGNOSIS — Y929 Unspecified place or not applicable: Secondary | ICD-10-CM | POA: Diagnosis not present

## 2015-06-01 DIAGNOSIS — Y939 Activity, unspecified: Secondary | ICD-10-CM | POA: Insufficient documentation

## 2015-06-01 DIAGNOSIS — Y999 Unspecified external cause status: Secondary | ICD-10-CM | POA: Insufficient documentation

## 2015-06-01 DIAGNOSIS — Z8739 Personal history of other diseases of the musculoskeletal system and connective tissue: Secondary | ICD-10-CM | POA: Insufficient documentation

## 2015-06-01 DIAGNOSIS — J45909 Unspecified asthma, uncomplicated: Secondary | ICD-10-CM | POA: Insufficient documentation

## 2015-06-01 DIAGNOSIS — S30860A Insect bite (nonvenomous) of lower back and pelvis, initial encounter: Secondary | ICD-10-CM

## 2015-06-01 DIAGNOSIS — Z720 Tobacco use: Secondary | ICD-10-CM | POA: Insufficient documentation

## 2015-06-01 MED ORDER — HYDROXYZINE HCL 25 MG PO TABS: 25.0000 mg | ORAL_TABLET | Freq: Four times a day (QID) | ORAL | Status: DC

## 2015-06-01 MED ORDER — HYDROCORTISONE 1 % EX CREA: 1.0000 "application " | TOPICAL_CREAM | Freq: Two times a day (BID) | CUTANEOUS | Status: DC

## 2015-06-01 MED ORDER — DIPHENHYDRAMINE HCL 25 MG PO CAPS
25.0000 mg | ORAL_CAPSULE | Freq: Once | ORAL | Status: AC
  Administered 2015-06-01: 25 mg via ORAL
  Filled 2015-06-01: qty 1

## 2015-06-01 MED ORDER — DIPHENHYDRAMINE HCL 25 MG PO TABS: 25.0000 mg | ORAL_TABLET | Freq: Four times a day (QID) | ORAL | Status: DC | PRN

## 2015-06-01 MED ORDER — HYDROXYZINE HCL 25 MG PO TABS
25.0000 mg | ORAL_TABLET | Freq: Once | ORAL | Status: AC
  Administered 2015-06-01: 25 mg via ORAL
  Filled 2015-06-01: qty 1

## 2015-06-01 NOTE — ED Notes (Signed)
Pt. reports insect bite at right buttocks with itching " it burns" , swelling/redness , denies fever or chills.

## 2015-06-01 NOTE — ED Provider Notes (Signed)
CSN: [id number]     Arrival date & time 06/01/15  0440 History   First MD Initiated Contact with Patient 06/01/15 0448     Chief Complaint  Patient presents with  . Insect Bite    (Consider location/radiation/quality/duration/timing/severity/associated sxs/prior Treatment) HPI Comments: 34 year old female with no significant past medical history presents to the emergency department for further evaluation of a bug bite to her right buttock. Patient states that she has had symptoms in the last 2 days. She reports worsening itching since onset. She is also complaining of a burning discomfort. She has noticed some swelling and redness to the area. She denies knowing what bit her. No fever or chills. No drainage from the area. No numbness in the extremity. Patient denies taking any medications prior to arrival for symptoms.  The history is provided by the patient. No language interpreter was used.    Past Medical History  Diagnosis Date  . Preterm labor     c/s at 36 wks  . Placenta previa     hx and with current pregnancy  . Migraines     last one over 1 month ago  . Anxiety     no meds  . Depression     no meds  . Asthma   . ADHD (attention deficit hyperactivity disorder)   . Scoliosis    Past Surgical History  Procedure Laterality Date  . Cesarean section  2010    x 1 at 36 wks in [place]  . Svd  2003    x 1 in [place]  . Wisdom tooth extraction    . Tubal ligation     Family History  Problem Relation Age of Onset  . Cancer Mother   . Hypertension Sister   . Sickle cell trait Daughter   . Asthma Son   . Asthma Paternal Grandmother   . Pseudochol deficiency Neg Hx   . Malignant hyperthermia Neg Hx   . Anesthesia problems Neg Hx   . Hypotension Neg Hx    History  Substance Use Topics  . Smoking status: Current Every Day Smoker -- 0.20 packs/day for 12 years    Types: Cigarettes  . Smokeless tobacco: Never Used  . Alcohol Use: No     Comment: on occ   OB History      Gravida Para Term Preterm AB TAB SAB Ectopic Multiple Living   3 3 1 2  0 0 0 0 0 3      Review of Systems  Constitutional: Negative for fever.  Respiratory: Negative for shortness of breath.   Skin: Positive for color change and rash.  All other systems reviewed and are negative.   Allergies  Review of patient's allergies indicates no known allergies.  Home Medications   Prior to Admission medications   Medication Sig Start Date End Date Taking? Authorizing Provider  aspirin-acetaminophen-caffeine (EXCEDRIN MIGRAINE) [phone] MG per tablet Take 2 tablets by mouth every 6 (six) hours as needed for headache.    Historical Provider, MD  butalbital-acetaminophen-caffeine (FIORICET) 50-325-40 MG per tablet Take 1 tablet by mouth every 6 (six) hours as needed for headache. 03/09/15   Elmyra Ricks Pisciotta, PA-C  diazepam (VALIUM) 5 MG tablet Take 1 tablet (5 mg total) by mouth every 8 (eight) hours as needed for anxiety. 05/21/15   Tatyana Kirichenko, PA-C  Diclofenac Potassium 25 MG CAPS Take 1 capsule (25 mg total) by mouth 2 (two) times daily. 03/18/15   April Palumbo, MD  diphenhydrAMINE (BENADRYL) 25 MG  tablet Take 1 tablet (25 mg total) by mouth every 6 (six) hours as needed for itching (Rash). 06/01/15   Antonietta Breach, PA-C  hydrocortisone cream 1 % Apply 1 application topically 2 (two) times daily. Do not apply to face 06/01/15   Antonietta Breach, PA-C  hydrOXYzine (ATARAX/VISTARIL) 25 MG tablet Take 1 tablet (25 mg total) by mouth every 6 (six) hours. 06/01/15   Antonietta Breach, PA-C  levonorgestrel (MIRENA) 20 MCG/24HR IUD 1 each by Intrauterine route continuous.     Historical Provider, MD  naproxen (NAPROSYN) 500 MG tablet Take 1 tablet (500 mg total) by mouth 2 (two) times daily. 05/21/15   Tatyana Kirichenko, PA-C   BP 127/76 mmHg  Pulse 83  Temp(Src) 99.3 F (37.4 C) (Oral)  Resp 24  SpO2 97%  LMP 05/23/2015   Physical Exam  Constitutional: She is oriented to person, place, and time. She  appears well-developed and well-nourished. No distress.  Nontoxic/nonseptic appearing  HENT:  Head: Normocephalic and atraumatic.  No angioedema  Eyes: Conjunctivae and EOM are normal. No scleral icterus.  Neck: Normal range of motion.  Cardiovascular: Normal rate, regular rhythm and intact distal pulses.   Pulmonary/Chest: Effort normal. No respiratory distress. She has no wheezes.  Respirations even and unlabored  Musculoskeletal: Normal range of motion.  Neurological: She is alert and oriented to person, place, and time. She exhibits normal muscle tone. Coordination normal.  Skin: Skin is warm and dry. Rash noted. No laceration and no purpura noted. Rash is urticarial (1cm x 3 cm irregularly shaped hive noted to R outer buttock; no weeping, drainage, streaking, or induration). Rash is not papular and not maculopapular. She is not diaphoretic. No pallor.     Psychiatric: She has a normal mood and affect. Her behavior is normal.  Nursing note and vitals reviewed.   ED Course  Procedures (including critical care time) Labs Review Labs Reviewed - No data to display  Imaging Review No results found.   EKG Interpretation None      MDM   Final diagnoses:  Insect bite of buttock with local reaction, initial encounter    34 year old female with physical exam findings consistent with a local reaction to a bug bite. No evidence of secondary infection or cellulitis. Patient is afebrile. We will manage symptoms with topical hydrocortisone, Atarax, and Benadryl. Return precautions discussed and provided. Patient discharged in good condition with no unaddressed concerns.   Filed Vitals:   06/01/15 0455  BP: 127/76  Pulse: 83  Temp: 99.3 F (37.4 C)  TempSrc: Oral  Resp: 24  SpO2: 97%     Antonietta Breach, PA-C 06/01/15 0510  Linton Flemings, MD 06/01/15 [phone]

## 2015-06-01 NOTE — Discharge Instructions (Signed)

## 2015-06-05 ENCOUNTER — Emergency Department (HOSPITAL_COMMUNITY): Payer: Medicaid Other

## 2015-06-05 ENCOUNTER — Emergency Department (HOSPITAL_COMMUNITY)
Admission: EM | Admit: 2015-06-05 | Discharge: 2015-06-05 | Disposition: A | Discharge status: Home / Self Care | Payer: Medicaid Other | Attending: Emergency Medicine | Admitting: Emergency Medicine

## 2015-06-05 ENCOUNTER — Encounter (HOSPITAL_COMMUNITY): Payer: Self-pay | Admitting: Emergency Medicine

## 2015-06-05 DIAGNOSIS — R110 Nausea: Secondary | ICD-10-CM | POA: Insufficient documentation

## 2015-06-05 DIAGNOSIS — R079 Chest pain, unspecified: Secondary | ICD-10-CM | POA: Diagnosis present

## 2015-06-05 DIAGNOSIS — Z8679 Personal history of other diseases of the circulatory system: Secondary | ICD-10-CM | POA: Insufficient documentation

## 2015-06-05 DIAGNOSIS — M940 Chondrocostal junction syndrome [Tietze]: Secondary | ICD-10-CM | POA: Diagnosis not present

## 2015-06-05 DIAGNOSIS — Z720 Tobacco use: Secondary | ICD-10-CM | POA: Diagnosis not present

## 2015-06-05 DIAGNOSIS — Z793 Long term (current) use of hormonal contraceptives: Secondary | ICD-10-CM | POA: Insufficient documentation

## 2015-06-05 DIAGNOSIS — Z8659 Personal history of other mental and behavioral disorders: Secondary | ICD-10-CM | POA: Insufficient documentation

## 2015-06-05 DIAGNOSIS — J45901 Unspecified asthma with (acute) exacerbation: Secondary | ICD-10-CM | POA: Diagnosis not present

## 2015-06-05 DIAGNOSIS — R0789 Other chest pain: Secondary | ICD-10-CM

## 2015-06-05 DIAGNOSIS — R51 Headache: Secondary | ICD-10-CM | POA: Diagnosis not present

## 2015-06-05 DIAGNOSIS — R071 Chest pain on breathing: Secondary | ICD-10-CM

## 2015-06-05 LAB — CBC
HCT: 42.1 % (ref 36.0–46.0)
Hemoglobin: 14.5 g/dL (ref 12.0–15.0)
MCH: 29.4 pg (ref 26.0–34.0)
MCHC: 34.4 g/dL (ref 30.0–36.0)
MCV: 85.2 fL (ref 78.0–100.0)
Platelets: 300 10*3/uL (ref 150–400)
RBC: 4.94 MIL/uL (ref 3.87–5.11)
RDW: 13.6 % (ref 11.5–15.5)
WBC: 12 10*3/uL — ABNORMAL HIGH (ref 4.0–10.5)

## 2015-06-05 LAB — BASIC METABOLIC PANEL
Anion gap: 9 (ref 5–15)
BUN: 8 mg/dL (ref 6–20)
CO2: 24 mmol/L (ref 22–32)
Calcium: 8.9 mg/dL (ref 8.9–10.3)
Chloride: 105 mmol/L (ref 101–111)
Creatinine, Ser: 0.7 mg/dL (ref 0.44–1.00)
GFR calc Af Amer: >60 mL/min (ref >60)
GFR calc non Af Amer: >60 mL/min (ref >60)
Glucose, Bld: 158 mg/dL — ABNORMAL HIGH (ref 65–99)
Potassium: 3.2 mmol/L — ABNORMAL LOW (ref 3.5–5.1)
Sodium: 138 mmol/L (ref 135–145)

## 2015-06-05 LAB — I-STAT TROPONIN, ED
Troponin i, poc: 0 ng/mL (ref 0.00–0.08)
Troponin i, poc: 0 ng/mL (ref 0.00–0.08)

## 2015-06-05 MED ORDER — MAGNESIUM SULFATE 2 GM/50ML IV SOLN
2.0000 g | Freq: Once | INTRAVENOUS | Status: AC
  Administered 2015-06-05: 2 g via INTRAVENOUS
  Filled 2015-06-05: qty 50

## 2015-06-05 MED ORDER — IBUPROFEN 200 MG PO TABS
600.0000 mg | ORAL_TABLET | Freq: Once | ORAL | Status: AC
  Administered 2015-06-05: 600 mg via ORAL
  Filled 2015-06-05: qty 3

## 2015-06-05 MED ORDER — NAPROXEN 500 MG PO TABS: 500.0000 mg | ORAL_TABLET | Freq: Two times a day (BID) | ORAL | Status: DC

## 2015-06-05 NOTE — ED Notes (Signed)
Pt brought by [facility] from home for CP 8/10 on the mid CP describe like pressure and SOB. Pt states she is been having some nausea but denies vomiting. VS WNL NAD noticed.

## 2015-06-05 NOTE — Discharge Instructions (Signed)
We saw you in the ER for the chest pain/shortness of breath. All of our cardiac workup is normal, including labs, EKG and chest X-RAY are normal. We are not sure what is causing your discomfort, but we feel comfortable sending you home at this time. The workup in the ER is not complete, and you should follow up with your primary care doctor for further evaluation.   Chest Wall Pain Chest wall pain is pain in or around the bones and muscles of your chest. It may take up to 6 weeks to get better. It may take longer if you must stay physically active in your work and activities.  CAUSES  Chest wall pain may happen on its own. However, it may be caused by:  A viral illness like the flu.  Injury.  Coughing.  Exercise.  Arthritis.  Fibromyalgia.  Shingles. HOME CARE INSTRUCTIONS   Avoid overtiring physical activity. Try not to strain or perform activities that cause pain. This includes any activities using your chest or your abdominal and side muscles, especially if heavy weights are used.  Put ice on the sore area.  Put ice in a plastic bag.  Place a towel between your skin and the bag.  Leave the ice on for 15-20 minutes per hour while awake for the first 2 days.  Only take over-the-counter or prescription medicines for pain, discomfort, or fever as directed by your caregiver. SEEK IMMEDIATE MEDICAL CARE IF:   Your pain increases, or you are very uncomfortable.  You have a fever.  Your chest pain becomes worse.  You have new, unexplained symptoms.  You have nausea or vomiting.  You feel sweaty or lightheaded.  You have a cough with phlegm (sputum), or you cough up blood. MAKE SURE YOU:   Understand these instructions.  Will watch your condition.  Will get help right away if you are not doing well or get worse. Document Released: 10/31/2005 Document Revised: 01/23/2012 Document Reviewed: 06/27/2011 [facility] Patient Information 2015 [place], [place]. This  information is not intended to replace advice given to you by your health care provider. Make sure you discuss any questions you have with your health care provider.  Chest Pain (Nonspecific) It is often hard to give a diagnosis for the cause of chest pain. There is always a chance that your pain could be related to something serious, such as a heart attack or a blood clot in the lungs. You need to follow up with your doctor. HOME CARE  If antibiotic medicine was given, take it as directed by your doctor. Finish the medicine even if you start to feel better.  For the next few days, avoid activities that bring on chest pain. Continue physical activities as told by your doctor.  Do not use any tobacco products. This includes cigarettes, chewing tobacco, and e-cigarettes.  Avoid drinking alcohol.  Only take medicine as told by your doctor.  Follow your doctor's suggestions for more testing if your chest pain does not go away.  Keep all doctor visits you made. GET HELP IF:  Your chest pain does not go away, even after treatment.  You have a rash with blisters on your chest.  You have a fever. GET HELP RIGHT AWAY IF:   You have more pain or pain that spreads to your arm, neck, jaw, back, or belly (abdomen).  You have shortness of breath.  You cough more than usual or cough up blood.  You have very bad back or belly pain.  You feel sick to your stomach (nauseous) or throw up (vomit).  You have very bad weakness.  You pass out (faint).  You have chills. This is an emergency. Do not wait to see if the problems will go away. Call your local emergency services (911 in [place] Do not drive yourself to the hospital. MAKE SURE YOU:   Understand these instructions.  Will watch your condition.  Will get help right away if you are not doing well or get worse. Document Released: 04/18/2008 Document Revised: 11/05/2013 Document Reviewed: 04/18/2008 [facility] Patient Information 2015  [place], [place]. This information is not intended to replace advice given to you by your health care provider. Make sure you discuss any questions you have with your health care provider.

## 2015-06-05 NOTE — ED Provider Notes (Signed)
CSN: [id number]     Arrival date & time 06/05/15  0302 History  This chart was scribed for Penny Biles, MD by Eustaquio Maize, ED Scribe. This patient was seen in room A05C/A05C and the patient's care was started at 3:54 AM.    Chief Complaint  Patient presents with  . Chest Pain  . Shortness of Breath   The history is provided by the patient. No language interpreter was used.     HPI Comments: Penny [name] is a 34 y.o. female brought in by ambulance, who presents to the Emergency Department complaining of sudden onset, constant, mid chest pain that began earlier tonight around 1 AM (approxiamtely 3 hours ago). She describes the pain as stabbing in sensation. The pain is exacerbated with applying pressure to the chest and deep breathing. Pt also complains of shortness of breath, which she states came on prior to the chest pain. Pt is currently not having shortness of breath but is still having chest pain. No pain radiation. She also notes nausea and a headache. Denies cough or any other associated symptoms. No hx DVT/PE, recent prolonged travel, or recent surgery. Pt is currently on Mirena birth control. Family history of paternal aunt with lupus.    Past Medical History  Diagnosis Date  . Preterm labor     c/s at 36 wks  . Placenta previa     hx and with current pregnancy  . Migraines     last one over 1 month ago  . Anxiety     no meds  . Depression     no meds  . Asthma   . ADHD (attention deficit hyperactivity disorder)   . Scoliosis    Past Surgical History  Procedure Laterality Date  . Cesarean section  2010    x 1 at 36 wks in [place]  . Svd  2003    x 1 in [place]  . Wisdom tooth extraction    . Tubal ligation     Family History  Problem Relation Age of Onset  . Cancer Mother   . Hypertension Sister   . Sickle cell trait Daughter   . Asthma Son   . Asthma Paternal Grandmother   . Pseudochol deficiency Neg Hx   . Malignant hyperthermia Neg Hx   . Anesthesia  problems Neg Hx   . Hypotension Neg Hx    History  Substance Use Topics  . Smoking status: Current Every Day Smoker -- 0.20 packs/day for 12 years    Types: Cigarettes  . Smokeless tobacco: Never Used  . Alcohol Use: No     Comment: on occ   OB History    Gravida Para Term Preterm AB TAB SAB Ectopic Multiple Living   3 3 1 2  0 0 0 0 0 3     Review of Systems  Constitutional: Negative for fever and chills.  HENT: Negative for congestion and rhinorrhea.   Respiratory: Positive for shortness of breath. Negative for cough.   Cardiovascular: Positive for chest pain.  Gastrointestinal: Positive for nausea. Negative for vomiting and abdominal pain.  Musculoskeletal: Negative for arthralgias.  Skin: Negative for rash.  Neurological: Positive for headaches.      Allergies  Review of patient's allergies indicates no known allergies.  Home Medications   Prior to Admission medications   Medication Sig Start Date End Date Taking? Authorizing Provider  aspirin-acetaminophen-caffeine (EXCEDRIN MIGRAINE) [phone] MG per tablet Take 2 tablets by mouth every 6 (six) hours as needed for  headache.   Yes Historical Provider, MD  levonorgestrel (MIRENA) 20 MCG/24HR IUD 1 each by Intrauterine route continuous.    Yes Historical Provider, MD  diphenhydrAMINE (BENADRYL) 25 MG tablet Take 1 tablet (25 mg total) by mouth every 6 (six) hours as needed for itching (Rash). Patient not taking: Reported on 06/05/2015 06/01/15   Antonietta Breach, PA-C  hydrocortisone cream 1 % Apply 1 application topically 2 (two) times daily. Do not apply to face Patient not taking: Reported on 06/05/2015 06/01/15   Antonietta Breach, PA-C  hydrOXYzine (ATARAX/VISTARIL) 25 MG tablet Take 1 tablet (25 mg total) by mouth every 6 (six) hours. Patient not taking: Reported on 06/05/2015 06/01/15   Antonietta Breach, PA-C  naproxen (NAPROSYN) 500 MG tablet Take 1 tablet (500 mg total) by mouth 2 (two) times daily. 06/05/15   Penny Biles, MD    Triage Vitals: BP 118/70 mmHg  Pulse 85  Temp(Src) 98 F (36.7 C) (Oral)  Resp 17  Ht 5\' 4"  (1.626 m)  Wt 180 lb (81.647 kg)  BMI 30.88 kg/m2  SpO2 98%  LMP 05/30/2015   Physical Exam  Constitutional: She is oriented to person, place, and time. She appears well-developed and well-nourished. No distress.  HENT:  Head: Normocephalic and atraumatic.  Eyes: Conjunctivae and EOM are normal.  Neck: Neck supple. No JVD present. No tracheal deviation present.  Cardiovascular: Normal rate, regular rhythm and normal heart sounds.   Pulmonary/Chest: Effort normal and breath sounds normal. No respiratory distress. She has no wheezes. She has no rales. She exhibits tenderness.  Reproducible left side chest wall pain with no rash appreciated.   Musculoskeletal: Normal range of motion.  Neurological: She is alert and oriented to person, place, and time.  Skin: Skin is warm and dry.  Psychiatric: She has a normal mood and affect. Her behavior is normal.  Nursing note and vitals reviewed.   ED Course  Procedures (including critical care time)  DIAGNOSTIC STUDIES: Oxygen Saturation is 98% on RA, normal by my interpretation.    COORDINATION OF CARE: 3:58 AM-Discussed treatment plan which includes pain medication with pt at bedside and pt agreed to plan.   Labs Review Labs Reviewed  BASIC METABOLIC PANEL - Abnormal; Notable for the following:    Potassium 3.2 (*)    Glucose, Bld 158 (*)    All other components within normal limits  CBC - Abnormal; Notable for the following:    WBC 12.0 (*)    All other components within normal limits  I-STAT TROPOININ, ED  Randolm Idol, ED    Imaging Review Dg Chest 2 View  06/05/2015   CLINICAL DATA:  Acute onset of generalized chest pain and shortness of breath. Initial encounter.  EXAM: CHEST  2 VIEW  COMPARISON:  Chest radiograph performed 03/18/2015  FINDINGS: The lungs are well-aerated and clear. There is no evidence of focal  opacification, pleural effusion or pneumothorax.  The heart is normal in size; the mediastinal contour is within normal limits. No acute osseous abnormalities are seen. Right convex thoracic scoliosis is noted.  IMPRESSION: Right convex thoracic scoliosis noted; lungs remain grossly clear.   Electronically Signed   By: Garald Balding M.D.   On: 06/05/2015 04:19     EKG Interpretation   Date/Time:  Friday June 05 2015 03:10:33 EDT Ventricular Rate:  83 PR Interval:  149 QRS Duration: 94 QT Interval:  365 QTC Calculation: 429 R Axis:   59 Text Interpretation:  Sinus rhythm Borderline T wave abnormalities  No  acute changes No significant change since last tracing Confirmed by  Kathrynn Humble, MD, Ermalinda Joubert [phone]) on 06/05/2015 3:13:35 AM      MDM   Final diagnoses:  Costochondral chest pain  Atypical chest pain    I personally performed the services described in this documentation, which was scribed in my presence. The recorded information has been reviewed and is accurate.  Pt comes in with cc of chest pain. Chest pain - pleuritic but also reproducible with palpation. Pt is PERC neg. Pt has no hx of PE, DVT and denies any exogenous estrogen use, long distance travels or surgery in the past 6 weeks, active cancer, recent immobilization. Pain is reproducible with palpation. There is fam hx of SLE - pt doesn't have it. Will get trops x 2 due to some atypical ekg findings - but HEAR score is 0.       Penny Biles, MD 06/05/15 [phone]

## 2015-06-05 NOTE — ED Notes (Signed)
Patient transported to X-ray 

## 2015-06-05 NOTE — ED Notes (Signed)
Pt provided a bus pass at time of discharge.

## 2015-06-12 ENCOUNTER — Inpatient Hospital Stay (HOSPITAL_COMMUNITY)
Admission: AD | Admit: 2015-06-12 | Discharge: 2015-06-13 | Disposition: A | Discharge status: Home / Self Care | Payer: Medicaid Other | Source: Ambulatory Visit | Attending: Obstetrics and Gynecology | Admitting: Obstetrics and Gynecology

## 2015-06-12 ENCOUNTER — Encounter (HOSPITAL_COMMUNITY): Payer: Self-pay | Admitting: *Deleted

## 2015-06-12 DIAGNOSIS — R102 Pelvic and perineal pain: Secondary | ICD-10-CM | POA: Diagnosis present

## 2015-06-12 NOTE — MAU Provider Note (Signed)
History    Penny [name] is a 34 y.o. female who presents, unannounced, for pelvic pain.  Patient states pain started Tuesday or Wednesday and has been ongoing.  Patient states she has taken aleve, 2 tablets daily, for the past 2 days without relief.  Patient states pain is constant and aggravated with certain movements.  Patient reports some light spotting and a regular menses on July 15.  Patient reports mirena IUD and tubal ligation.   Patient Active Problem List   Diagnosis Date Noted  . Migraines   . Anxiety   . Depression   . 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 Abdominal pain complicating pregnancy 40/98/1191    No chief complaint on file.  HPI  OB History    Gravida Para Term Preterm AB TAB SAB Ectopic Multiple Living   3 3 1 2  0 0 0 0 0 3      Past Medical History  Diagnosis Date  . Preterm labor     c/s at 36 wks  . Placenta previa     hx and with current pregnancy  . Migraines     last one over 1 month ago  . Anxiety     no meds  . Depression     no meds  . Asthma   . ADHD (attention deficit hyperactivity disorder)   . Scoliosis     Past Surgical History  Procedure Laterality Date  . Cesarean section  2010    x 1 at 36 wks in [place]  . Svd  2003    x 1 in [place]  . Wisdom tooth extraction    . Tubal ligation      Family History  Problem Relation Age of Onset  . Cancer Mother   . Hypertension Sister   . Sickle cell trait Daughter   . Asthma Son   . Asthma Paternal Grandmother   . Pseudochol deficiency Neg Hx   . Malignant hyperthermia Neg Hx   . Anesthesia problems Neg Hx   . Hypotension Neg Hx     History  Substance Use Topics  . Smoking status: Current Every Day Smoker -- 0.20 packs/day for 12 years    Types: Cigarettes  . Smokeless tobacco: Never Used  . Alcohol Use: No     Comment: on occ    Allergies: No Known Allergies  Prescriptions prior to admission  Medication Sig Dispense Refill Last  Dose  . aspirin-acetaminophen-caffeine (EXCEDRIN MIGRAINE) 250-250-65 MG per tablet Take 2 tablets by mouth every 6 (six) hours as needed for headache.   couple months  . diphenhydrAMINE (BENADRYL) 25 MG tablet Take 1 tablet (25 mg total) by mouth every 6 (six) hours as needed for itching (Rash). (Patient not taking: Reported on 06/05/2015) 30 tablet 0   . hydrocortisone cream 1 % Apply 1 application topically 2 (two) times daily. Do not apply to face (Patient not taking: Reported on 06/05/2015) 15 g 1   . hydrOXYzine (ATARAX/VISTARIL) 25 MG tablet Take 1 tablet (25 mg total) by mouth every 6 (six) hours. (Patient not taking: Reported on 06/05/2015) 12 tablet 0   . levonorgestrel (MIRENA) 20 MCG/24HR IUD 1 each by Intrauterine route continuous.    06/05/2015 at Unknown time  . naproxen (NAPROSYN) 500 MG tablet Take 1 tablet (500 mg total) by mouth 2 (two) times daily. 30 tablet 0     ROS  See HPI Above Physical Exam   Pulse 110, temperature 98.9 F (37.2  C), temperature source Oral, resp. rate 16, last menstrual period 05/29/2015, SpO2 99 %.  Results for orders placed or performed during the hospital encounter of 06/12/15 (from the past 24 hour(s))  Urinalysis, Routine w reflex microscopic (not at [facility])     Status: Abnormal   Collection Time: 06/12/15 11:45 PM  Result Value Ref Range   Color, Urine YELLOW YELLOW   APPearance CLEAR CLEAR   Specific Gravity, Urine >1.030 (H) 1.005 - 1.030   pH 5.5 5.0 - 8.0   Glucose, UA NEGATIVE NEGATIVE mg/dL   Hgb urine dipstick MODERATE (A) NEGATIVE   Bilirubin Urine NEGATIVE NEGATIVE   Ketones, ur NEGATIVE NEGATIVE mg/dL   Protein, ur NEGATIVE NEGATIVE mg/dL   Urobilinogen, UA 1.0 0.0 - 1.0 mg/dL   Nitrite NEGATIVE NEGATIVE   Leukocytes, UA NEGATIVE NEGATIVE  Urine microscopic-add on     Status: Abnormal   Collection Time: 06/12/15 11:45 PM  Result Value Ref Range   Squamous Epithelial / LPF FEW (A) RARE   WBC, UA 0-2 <3 WBC/hpf   RBC / HPF 0-2 <3  RBC/hpf   Bacteria, UA RARE RARE   Urine-Other MUCOUS PRESENT   Wet prep, genital     Status: None   Collection Time: 06/13/15 12:01 AM  Result Value Ref Range   Yeast Wet Prep HPF POC NONE SEEN NONE SEEN   Trich, Wet Prep NONE SEEN NONE SEEN   Clue Cells Wet Prep HPF POC NONE SEEN NONE SEEN   WBC, Wet Prep HPF POC NONE SEEN NONE SEEN  Pregnancy, urine POC     Status: None   Collection Time: 06/13/15 12:15 AM  Result Value Ref Range   Preg Test, Ur NEGATIVE NEGATIVE    Physical Exam  Constitutional: She is oriented to person, place, and time. She appears well-developed and well-nourished.  HENT:  Head: Normocephalic and atraumatic.  Eyes: EOM are normal. Pupils are equal, round, and reactive to light.  Neck: Normal range of motion.  Cardiovascular: Normal rate, regular rhythm and normal heart sounds.   Respiratory: Effort normal and breath sounds normal.  GI: Soft. Bowel sounds are normal. She exhibits no distension and no mass. There is tenderness. There is no rebound and no guarding.  Genitourinary: Rectum normal and uterus normal. Cervix exhibits motion tenderness. Cervix exhibits no discharge and no friability. Right adnexum displays tenderness. Left adnexum displays no tenderness. No vaginal discharge found.  Speculum Exam:  -Vaginal Vault: Odor noted. Appears pale, No discharge noted -wet prep collected -Cervix: Strings visualized from os-No discharge.  GC/CT collected Bimanual Exam: Closed/Long/Thick Ovaries palpated WNL in regards to size  Musculoskeletal: Normal range of motion. She exhibits no edema.  Neurological: She is alert and oriented to person, place, and time.  Skin: Skin is warm and dry.    ED Course  Assessment: 33y.o. Female Pelvic Pain Mirena  Plan: -PE as above -Labs: UA, Wet Prep, GC/CT -Ibuprofen 800mg   Follow Up (1540) -Patient reports no relief with ibuprofen  -Results negative -Give percocet 2 tabs now, then reassess  Follow Up  (0130) -Patient reports good results from percocet -Discussed follow up; contact office if symptoms worsen.  Contact office for follow up appt in symptoms persist after one week.   -Take medication as prescribed -Rx: Percocet 2.5/325 1-2 tab q 6hrs prn.  Disp 30, RF 0 -Rx: Motrin 800mg  1 tab q 8hrs Disp 30, RF 0 -Discussed need for annual pap smear--patient states she has no insurance  -Encouraged to call  if any questions or concerns arise prior to next scheduled office visit.   Omolara Carol LYNN CNM, MSN 06/12/2015 11:28 PM

## 2015-06-12 NOTE — MAU Note (Signed)
Pelvic pain more on right side, started yesterday and is getting worse.  Had tubal ligation in 2013 and has Mirena presently but is worried that may be pregnant - having nausea and breast tenderness.  Took home pregnancy test last week and was negative.  Spotting.

## 2015-06-13 LAB — URINE MICROSCOPIC-ADD ON

## 2015-06-13 LAB — URINALYSIS, ROUTINE W REFLEX MICROSCOPIC
Bilirubin Urine: NEGATIVE
Glucose, UA: NEGATIVE mg/dL
Ketones, ur: NEGATIVE mg/dL
Leukocytes, UA: NEGATIVE
Nitrite: NEGATIVE
Protein, ur: NEGATIVE mg/dL
Specific Gravity, Urine: >1.03 — ABNORMAL HIGH (ref 1.005–1.030)
Urobilinogen, UA: 1 mg/dL (ref 0.0–1.0)
pH: 5.5 (ref 5.0–8.0)

## 2015-06-13 LAB — WET PREP, GENITAL
Clue Cells Wet Prep HPF POC: NONE SEEN
Trich, Wet Prep: NONE SEEN
WBC, Wet Prep HPF POC: NONE SEEN
Yeast Wet Prep HPF POC: NONE SEEN

## 2015-06-13 LAB — POCT PREGNANCY, URINE: Preg Test, Ur: NEGATIVE

## 2015-06-13 MED ORDER — OXYCODONE-ACETAMINOPHEN 2.5-325 MG PO TABS: 1.0000 | ORAL_TABLET | Freq: Four times a day (QID) | ORAL | Status: DC | PRN

## 2015-06-13 MED ORDER — IBUPROFEN 800 MG PO TABS: 800.0000 mg | ORAL_TABLET | Freq: Three times a day (TID) | ORAL | Status: DC | PRN

## 2015-06-13 MED ORDER — IBUPROFEN 800 MG PO TABS
800.0000 mg | ORAL_TABLET | Freq: Once | ORAL | Status: AC
  Administered 2015-06-13: 800 mg via ORAL
  Filled 2015-06-13: qty 1

## 2015-06-13 MED ORDER — OXYCODONE-ACETAMINOPHEN 5-325 MG PO TABS
2.0000 | ORAL_TABLET | Freq: Once | ORAL | Status: AC
  Administered 2015-06-13: 2 via ORAL
  Filled 2015-06-13: qty 2

## 2015-06-13 NOTE — Discharge Instructions (Signed)
Pelvic Pain Pelvic pain is pain felt below the belly button and between your hips. It can be caused by many different things. It is important to get help right away. This is especially true for severe, sharp, or unusual pain that comes on suddenly.  HOME CARE  Only take medicine as told by your doctor.  Rest as told by your doctor.  Eat a healthy diet, such as fruits, vegetables, and lean meats.  Drink enough fluids to keep your pee (urine) clear or pale yellow, or as told.  Avoid sex (intercourse) if it causes pain.  Apply warm or cold packs to your lower belly (abdomen). Use the type of pack that helps the pain.  Avoid situations that cause you stress.  Keep a journal to track your pain. Write down:  When the pain started.  Where it is located.  If there are things that seem to be related to the pain, such as food or your period.  Follow up with your doctor as told. GET HELP RIGHT AWAY IF:   You have heavy bleeding from the vagina.  You have more pelvic pain.  You feel lightheaded or pass out (faint).  You have chills.  You have pain when you pee or have blood in your pee.  You cannot stop having watery poop (diarrhea).  You cannot stop throwing up (vomiting).  You have a fever or lasting symptoms for more than 3 days.  You have a fever and your symptoms suddenly get worse.  You are being physically or sexually abused.  Your medicine does not help your pain.  You have fluid (discharge) coming from your vagina that is not normal. MAKE SURE YOU:  Understand these instructions.  Will watch your condition.  Will get help if you are not doing well or get worse. Document Released: 04/18/2008 Document Revised: 05/01/2012 Document Reviewed: 02/20/2012 [facility] Patient Information 2015 [place], [place]. This information is not intended to replace advice given to you by your health care provider. Make sure you discuss any questions you have with your health care  provider.

## 2015-06-15 LAB — GC/CHLAMYDIA PROBE AMP (~~LOC~~) NOT AT ARMC
Chlamydia: NEGATIVE
Neisseria Gonorrhea: NEGATIVE

## 2015-06-16 ENCOUNTER — Encounter (HOSPITAL_COMMUNITY): Payer: Self-pay | Admitting: Adult Health

## 2015-06-16 ENCOUNTER — Emergency Department (HOSPITAL_COMMUNITY)
Admission: EM | Admit: 2015-06-16 | Discharge: 2015-06-17 | Disposition: A | Discharge status: Home / Self Care | Payer: Medicaid Other | Attending: Emergency Medicine | Admitting: Emergency Medicine

## 2015-06-16 DIAGNOSIS — Z3202 Encounter for pregnancy test, result negative: Secondary | ICD-10-CM | POA: Diagnosis not present

## 2015-06-16 DIAGNOSIS — R112 Nausea with vomiting, unspecified: Secondary | ICD-10-CM | POA: Insufficient documentation

## 2015-06-16 DIAGNOSIS — J45909 Unspecified asthma, uncomplicated: Secondary | ICD-10-CM | POA: Insufficient documentation

## 2015-06-16 DIAGNOSIS — Z720 Tobacco use: Secondary | ICD-10-CM | POA: Diagnosis not present

## 2015-06-16 DIAGNOSIS — R102 Pelvic and perineal pain: Secondary | ICD-10-CM | POA: Insufficient documentation

## 2015-06-16 DIAGNOSIS — Z8679 Personal history of other diseases of the circulatory system: Secondary | ICD-10-CM | POA: Insufficient documentation

## 2015-06-16 DIAGNOSIS — Z8659 Personal history of other mental and behavioral disorders: Secondary | ICD-10-CM | POA: Insufficient documentation

## 2015-06-16 DIAGNOSIS — M419 Scoliosis, unspecified: Secondary | ICD-10-CM | POA: Insufficient documentation

## 2015-06-16 DIAGNOSIS — R109 Unspecified abdominal pain: Secondary | ICD-10-CM | POA: Diagnosis present

## 2015-06-16 LAB — CBC
HCT: 44.2 % (ref 36.0–46.0)
Hemoglobin: 15 g/dL (ref 12.0–15.0)
MCH: 29.1 pg (ref 26.0–34.0)
MCHC: 33.9 g/dL (ref 30.0–36.0)
MCV: 85.7 fL (ref 78.0–100.0)
Platelets: 314 10*3/uL (ref 150–400)
RBC: 5.16 MIL/uL — ABNORMAL HIGH (ref 3.87–5.11)
RDW: 13.5 % (ref 11.5–15.5)
WBC: 13.5 10*3/uL — ABNORMAL HIGH (ref 4.0–10.5)

## 2015-06-16 LAB — COMPREHENSIVE METABOLIC PANEL
ALT: 20 U/L (ref 14–54)
AST: 25 U/L (ref 15–41)
Albumin: 3.4 g/dL — ABNORMAL LOW (ref 3.5–5.0)
Alkaline Phosphatase: 64 U/L (ref 38–126)
Anion gap: 6 (ref 5–15)
BUN: 9 mg/dL (ref 6–20)
CO2: 25 mmol/L (ref 22–32)
Calcium: 8.8 mg/dL — ABNORMAL LOW (ref 8.9–10.3)
Chloride: 108 mmol/L (ref 101–111)
Creatinine, Ser: 0.58 mg/dL (ref 0.44–1.00)
GFR calc Af Amer: >60 mL/min (ref >60)
GFR calc non Af Amer: >60 mL/min (ref >60)
Glucose, Bld: 141 mg/dL — ABNORMAL HIGH (ref 65–99)
Potassium: 3.7 mmol/L (ref 3.5–5.1)
Sodium: 139 mmol/L (ref 135–145)
Total Bilirubin: 0.1 mg/dL — ABNORMAL LOW (ref 0.3–1.2)
Total Protein: 6.3 g/dL — ABNORMAL LOW (ref 6.5–8.1)

## 2015-06-16 LAB — URINALYSIS, ROUTINE W REFLEX MICROSCOPIC
Bilirubin Urine: NEGATIVE
Glucose, UA: NEGATIVE mg/dL
Hgb urine dipstick: NEGATIVE
Ketones, ur: NEGATIVE mg/dL
Leukocytes, UA: NEGATIVE
Nitrite: NEGATIVE
Protein, ur: NEGATIVE mg/dL
Specific Gravity, Urine: 1.021 (ref 1.005–1.030)
Urobilinogen, UA: 1 mg/dL (ref 0.0–1.0)
pH: 7 (ref 5.0–8.0)

## 2015-06-16 LAB — I-STAT BETA HCG BLOOD, ED (MC, WL, AP ONLY): I-stat hCG, quantitative: <5 m[IU]/mL (ref <5)

## 2015-06-16 MED ORDER — ONDANSETRON HCL 4 MG/2ML IJ SOLN
4.0000 mg | Freq: Once | INTRAMUSCULAR | Status: DC
  Filled 2015-06-16: qty 2

## 2015-06-16 MED ORDER — PROMETHAZINE HCL 25 MG/ML IJ SOLN
12.5000 mg | Freq: Once | INTRAMUSCULAR | Status: AC
  Administered 2015-06-17: 12.5 mg via INTRAVENOUS
  Filled 2015-06-16: qty 1

## 2015-06-16 NOTE — ED Notes (Signed)
Presents with RLQ abd pain that radiates in to RL pelvic area pain began Thursday is associated with nausea and vomiting. Described as "something is punching me" endorses vaginal spotting-worried she may be pregnant-LMP 06/01/15 pai is worsening-taking ibuprofen and phenergan with no relief.

## 2015-06-16 NOTE — ED Provider Notes (Signed)
CSN: [id number]     Arrival date & time 06/16/15  2223 History   First MD Initiated Contact with Patient 06/16/15 2250     Chief Complaint  Patient presents with  . Abdominal Pain     (Consider location/radiation/quality/duration/timing/severity/associated sxs/prior Treatment) Patient is a 34 y.o. female presenting with abdominal pain. The history is provided by the patient.  Abdominal Pain Pain location:  RLQ Associated symptoms: nausea and vomiting   Associated symptoms: no chest pain, no diarrhea and no shortness of breath    patient has had pain in her right lower quadrant for the last 5 days. Dull and constant. Now has nausea. Has had a little bit of vaginal spotting over the last couple days. No fevers or chills. No diarrhea. Patient has a Corporate treasurer. During the history patient did mention in however she was seen at [facility] 3 days ago for the same. Had pelvic exam at that time. Patient wonders if she needs an ultrasound. No fevers. No chills.  Past Medical History  Diagnosis Date  . Preterm labor     c/s at 36 wks  . Placenta previa     hx and with current pregnancy  . Migraines     last one over 1 month ago  . Anxiety     no meds  . Depression     no meds  . Asthma   . ADHD (attention deficit hyperactivity disorder)   . Scoliosis    Past Surgical History  Procedure Laterality Date  . Cesarean section  2010    x 1 at 36 wks in [place]  . Svd  2003    x 1 in [place]  . Wisdom tooth extraction    . Tubal ligation     Family History  Problem Relation Age of Onset  . Cancer Mother   . Hypertension Sister   . Sickle cell trait Daughter   . Asthma Son   . Asthma Paternal Grandmother   . Pseudochol deficiency Neg Hx   . Malignant hyperthermia Neg Hx   . Anesthesia problems Neg Hx   . Hypotension Neg Hx    History  Substance Use Topics  . Smoking status: Current Every Day Smoker -- 0.25 packs/day for 12 years    Types: Cigarettes  . Smokeless tobacco: Never  Used  . Alcohol Use: No     Comment: on occ   OB History    Gravida Para Term Preterm AB TAB SAB Ectopic Multiple Living   3 3 1 2  0 0 0 0 0 3     Review of Systems  Constitutional: Negative for activity change and appetite change.  Eyes: Negative for pain.  Respiratory: Negative for chest tightness and shortness of breath.   Cardiovascular: Negative for chest pain and leg swelling.  Gastrointestinal: Positive for nausea, vomiting and abdominal pain. Negative for diarrhea.  Genitourinary: Negative for flank pain.  Musculoskeletal: Negative for back pain and neck stiffness.  Skin: Negative for rash.  Neurological: Negative for weakness, numbness and headaches.  Psychiatric/Behavioral: Negative for behavioral problems.      Allergies  Review of patient's allergies indicates no known allergies.  Home Medications   Prior to Admission medications   Medication Sig Start Date End Date Taking? Authorizing Provider  ibuprofen (ADVIL,MOTRIN) 800 MG tablet Take 1 tablet (800 mg total) by mouth every 8 (eight) hours as needed. 06/13/15  Yes Gavin Pound, CNM  levonorgestrel (MIRENA) 20 MCG/24HR IUD 1 each by Intrauterine route continuous.  Yes Historical Provider, MD  diphenhydrAMINE (BENADRYL) 25 MG tablet Take 1 tablet (25 mg total) by mouth every 6 (six) hours as needed for itching (Rash). Patient not taking: Reported on 06/05/2015 06/01/15   Antonietta Breach, PA-C  oxycodone-acetaminophen (PERCOCET) 2.5-325 MG per tablet Take 1-2 tablets by mouth every 6 (six) hours as needed for pain. Patient not taking: Reported on 06/16/2015 06/13/15   Gavin Pound, CNM  promethazine (PHENERGAN) 25 MG tablet Take 1 tablet (25 mg total) by mouth every 6 (six) hours as needed for nausea or vomiting. 06/17/15   Davonna Belling, MD   BP 134/85 mmHg  Pulse 84  Temp(Src) 98.7 F (37.1 C) (Oral)  Resp 18  Ht 5\' 4"  (1.626 m)  Wt 184 lb (83.462 kg)  BMI 31.57 kg/m2  SpO2 99%  LMP 05/29/2015 Physical Exam   Constitutional: She is oriented to person, place, and time. She appears well-developed and well-nourished.  HENT:  Head: Normocephalic and atraumatic.  Neck: Normal range of motion.  Cardiovascular: Normal rate, regular rhythm and normal heart sounds.   No murmur heard. Pulmonary/Chest: Effort normal and breath sounds normal. No respiratory distress. She has no wheezes. She has no rales.  Abdominal: Soft. Bowel sounds are normal. She exhibits no distension. There is tenderness. There is no rebound and no guarding.  Very mild deep right lower quadrant tenderness. No rebound or guarding.  Musculoskeletal: Normal range of motion.  Neurological: She is alert and oriented to person, place, and time. No cranial nerve deficit.  Skin: Skin is warm.  Psychiatric: Her speech is normal.  Nursing note and vitals reviewed.   ED Course  Procedures (including critical care time) Labs Review Labs Reviewed  COMPREHENSIVE METABOLIC PANEL - Abnormal; Notable for the following:    Glucose, Bld 141 (*)    Calcium 8.8 (*)    Total Protein 6.3 (*)    Albumin 3.4 (*)    Total Bilirubin 0.1 (*)    All other components within normal limits  CBC - Abnormal; Notable for the following:    WBC 13.5 (*)    RBC 5.16 (*)    All other components within normal limits  URINALYSIS, ROUTINE W REFLEX MICROSCOPIC (NOT AT [facility]) - Abnormal; Notable for the following:    APPearance CLOUDY (*)    All other components within normal limits  I-STAT BETA HCG BLOOD, ED ([facility], [facility], [facility] ONLY)    Imaging Review No results found.   EKG Interpretation None      MDM   Final diagnoses:  Pain, pelvic, female    Pain with right   sided abdominal tenderness. Seen at [facility] for same recently. Negative workup at that time. White count is mildly elevated. Minimal tenderness on exam. Has had frequent visits for headaches. Do not feel she CT at this time the white count is some worry. Will discharge home was given  instructions to follow-up disease.  Davonna Belling, MD 06/17/15 [phone]

## 2015-06-16 NOTE — ED Notes (Signed)
Bed: WA08 Expected date:  Expected time:  Means of arrival:  Comments: EMS/abd pain/?pregnant

## 2015-06-17 MED ORDER — PROMETHAZINE HCL 25 MG PO TABS: 25.0000 mg | ORAL_TABLET | Freq: Four times a day (QID) | ORAL | Status: DC | PRN

## 2015-06-17 NOTE — Discharge Instructions (Signed)
Abdominal Pain, Women °Abdominal (stomach, pelvic, or belly) pain can be caused by many things. It is important to tell your doctor: °· The location of the pain. °· Does it come and go or is it present all the time? °· Are there things that start the pain (eating certain foods, exercise)? °· Are there other symptoms associated with the pain (fever, nausea, vomiting, diarrhea)? °All of this is helpful to know when trying to find the cause of the pain. °CAUSES  °· Stomach: virus or bacteria infection, or ulcer. °· Intestine: appendicitis (inflamed appendix), regional ileitis (Crohn's disease), ulcerative colitis (inflamed colon), irritable bowel syndrome, diverticulitis (inflamed diverticulum of the colon), or cancer of the stomach or intestine. °· Gallbladder disease or stones in the gallbladder. °· Kidney disease, kidney stones, or infection. °· Pancreas infection or cancer. °· Fibromyalgia (pain disorder). °· Diseases of the female organs: °¨ Uterus: fibroid (non-cancerous) tumors or infection. °¨ Fallopian tubes: infection or tubal pregnancy. °¨ Ovary: cysts or tumors. °¨ Pelvic adhesions (scar tissue). °¨ Endometriosis (uterus lining tissue growing in the pelvis and on the pelvic organs). °¨ Pelvic congestion syndrome (female organs filling up with blood just before the menstrual period). °¨ Pain with the menstrual period. °¨ Pain with ovulation (producing an egg). °¨ Pain with an IUD (intrauterine device, birth control) in the uterus. °¨ Cancer of the female organs. °· Functional pain (pain not caused by a disease, may improve without treatment). °· Psychological pain. °· Depression. °DIAGNOSIS  °Your doctor will decide the seriousness of your pain by doing an examination. °· Blood tests. °· X-rays. °· Ultrasound. °· CT scan (computed tomography, special type of X-ray). °· MRI (magnetic resonance imaging). °· Cultures, for infection. °· Barium enema (dye inserted in the large intestine, to better view it with  X-rays). °· Colonoscopy (looking in intestine with a lighted tube). °· Laparoscopy (minor surgery, looking in abdomen with a lighted tube). °· Major abdominal exploratory surgery (looking in abdomen with a large incision). °TREATMENT  °The treatment will depend on the cause of the pain.  °· Many cases can be observed and treated at home. °· Over-the-counter medicines recommended by your caregiver. °· Prescription medicine. °· Antibiotics, for infection. °· Birth control pills, for painful periods or for ovulation pain. °· Hormone treatment, for endometriosis. °· Nerve blocking injections. °· Physical therapy. °· Antidepressants. °· Counseling with a psychologist or psychiatrist. °· Minor or major surgery. °HOME CARE INSTRUCTIONS  °· Do not take laxatives, unless directed by your caregiver. °· Take over-the-counter pain medicine only if ordered by your caregiver. Do not take aspirin because it can cause an upset stomach or bleeding. °· Try a clear liquid diet (broth or water) as ordered by your caregiver. Slowly move to a bland diet, as tolerated, if the pain is related to the stomach or intestine. °· Have a thermometer and take your temperature several times a day, and record it. °· Bed rest and sleep, if it helps the pain. °· Avoid sexual intercourse, if it causes pain. °· Avoid stressful situations. °· Keep your follow-up appointments and tests, as your caregiver orders. °· If the pain does not go away with medicine or surgery, you may try: °¨ Acupuncture. °¨ Relaxation exercises (yoga, meditation). °¨ Group therapy. °¨ Counseling. °SEEK MEDICAL CARE IF:  °· You notice certain foods cause stomach pain. °· Your home care treatment is not helping your pain. °· You need stronger pain medicine. °· You want your IUD removed. °· You feel faint or   lightheaded. °· You develop nausea and vomiting. °· You develop a rash. °· You are having side effects or an allergy to your medicine. °SEEK IMMEDIATE MEDICAL CARE IF:  °· Your  pain does not go away or gets worse. °· You have a fever. °· Your pain is felt only in portions of the abdomen. The right side could possibly be appendicitis. The left lower portion of the abdomen could be colitis or diverticulitis. °· You are passing blood in your stools (bright red or black tarry stools, with or without vomiting). °· You have blood in your urine. °· You develop chills, with or without a fever. °· You pass out. °MAKE SURE YOU:  °· Understand these instructions. °· Will watch your condition. °· Will get help right away if you are not doing well or get worse. °Document Released: 08/28/2007 Document Revised: 03/17/2014 Document Reviewed: 09/17/2009 °Exit[employer]® Patient Information ©2015 [employer]. This information is not intended to replace advice given to you by your health care provider. Make sure you discuss any questions you have with your health care provider. ° °

## 2015-06-19 ENCOUNTER — Encounter (HOSPITAL_COMMUNITY): Payer: Self-pay | Admitting: Emergency Medicine

## 2015-06-19 ENCOUNTER — Emergency Department (HOSPITAL_COMMUNITY)
Admission: EM | Admit: 2015-06-19 | Discharge: 2015-06-19 | Disposition: A | Discharge status: Home / Self Care | Payer: Medicaid Other | Attending: Emergency Medicine | Admitting: Emergency Medicine

## 2015-06-19 ENCOUNTER — Emergency Department (HOSPITAL_COMMUNITY): Payer: Medicaid Other

## 2015-06-19 DIAGNOSIS — Z8679 Personal history of other diseases of the circulatory system: Secondary | ICD-10-CM | POA: Diagnosis not present

## 2015-06-19 DIAGNOSIS — R102 Pelvic and perineal pain unspecified side: Secondary | ICD-10-CM

## 2015-06-19 DIAGNOSIS — Z8659 Personal history of other mental and behavioral disorders: Secondary | ICD-10-CM | POA: Insufficient documentation

## 2015-06-19 DIAGNOSIS — Z720 Tobacco use: Secondary | ICD-10-CM | POA: Diagnosis not present

## 2015-06-19 DIAGNOSIS — R109 Unspecified abdominal pain: Secondary | ICD-10-CM

## 2015-06-19 DIAGNOSIS — R21 Rash and other nonspecific skin eruption: Secondary | ICD-10-CM | POA: Insufficient documentation

## 2015-06-19 DIAGNOSIS — R112 Nausea with vomiting, unspecified: Secondary | ICD-10-CM | POA: Insufficient documentation

## 2015-06-19 DIAGNOSIS — Z793 Long term (current) use of hormonal contraceptives: Secondary | ICD-10-CM | POA: Diagnosis not present

## 2015-06-19 DIAGNOSIS — J45909 Unspecified asthma, uncomplicated: Secondary | ICD-10-CM | POA: Insufficient documentation

## 2015-06-19 DIAGNOSIS — Z8739 Personal history of other diseases of the musculoskeletal system and connective tissue: Secondary | ICD-10-CM | POA: Diagnosis not present

## 2015-06-19 DIAGNOSIS — R1031 Right lower quadrant pain: Secondary | ICD-10-CM | POA: Diagnosis present

## 2015-06-19 LAB — COMPREHENSIVE METABOLIC PANEL
ALT: 30 U/L (ref 14–54)
AST: 28 U/L (ref 15–41)
Albumin: 3.6 g/dL (ref 3.5–5.0)
Alkaline Phosphatase: 64 U/L (ref 38–126)
Anion gap: 8 (ref 5–15)
BUN: 6 mg/dL (ref 6–20)
CO2: 23 mmol/L (ref 22–32)
Calcium: 8.7 mg/dL — ABNORMAL LOW (ref 8.9–10.3)
Chloride: 105 mmol/L (ref 101–111)
Creatinine, Ser: 0.66 mg/dL (ref 0.44–1.00)
GFR calc Af Amer: >60 mL/min (ref >60)
GFR calc non Af Amer: >60 mL/min (ref >60)
Glucose, Bld: 95 mg/dL (ref 65–99)
Potassium: 3.9 mmol/L (ref 3.5–5.1)
Sodium: 136 mmol/L (ref 135–145)
Total Bilirubin: 0.5 mg/dL (ref 0.3–1.2)
Total Protein: 6.6 g/dL (ref 6.5–8.1)

## 2015-06-19 LAB — CBC
HCT: 45.3 % (ref 36.0–46.0)
Hemoglobin: 15.6 g/dL — ABNORMAL HIGH (ref 12.0–15.0)
MCH: 29.9 pg (ref 26.0–34.0)
MCHC: 34.4 g/dL (ref 30.0–36.0)
MCV: 86.8 fL (ref 78.0–100.0)
Platelets: 334 10*3/uL (ref 150–400)
RBC: 5.22 MIL/uL — ABNORMAL HIGH (ref 3.87–5.11)
RDW: 13.7 % (ref 11.5–15.5)
WBC: 13.7 10*3/uL — ABNORMAL HIGH (ref 4.0–10.5)

## 2015-06-19 LAB — URINALYSIS, ROUTINE W REFLEX MICROSCOPIC
Bilirubin Urine: NEGATIVE
Glucose, UA: NEGATIVE mg/dL
Ketones, ur: NEGATIVE mg/dL
Leukocytes, UA: NEGATIVE
Nitrite: NEGATIVE
Protein, ur: NEGATIVE mg/dL
Specific Gravity, Urine: 1.023 (ref 1.005–1.030)
Urobilinogen, UA: 1 mg/dL (ref 0.0–1.0)
pH: 6.5 (ref 5.0–8.0)

## 2015-06-19 LAB — POC URINE PREG, ED: Preg Test, Ur: NEGATIVE

## 2015-06-19 LAB — LIPASE, BLOOD: Lipase: 36 U/L (ref 22–51)

## 2015-06-19 LAB — URINE MICROSCOPIC-ADD ON

## 2015-06-19 MED ORDER — IOHEXOL 300 MG/ML  SOLN
100.0000 mL | Freq: Once | INTRAMUSCULAR | Status: AC | PRN
  Administered 2015-06-19: 100 mL via INTRAVENOUS

## 2015-06-19 MED ORDER — HYDROCODONE-ACETAMINOPHEN 5-325 MG PO TABS
2.0000 | ORAL_TABLET | Freq: Once | ORAL | Status: AC
  Administered 2015-06-19: 2 via ORAL
  Filled 2015-06-19: qty 2

## 2015-06-19 MED ORDER — CEPHALEXIN 500 MG PO CAPS: 500.0000 mg | ORAL_CAPSULE | Freq: Four times a day (QID) | ORAL | Status: DC

## 2015-06-19 MED ORDER — KETOROLAC TROMETHAMINE 30 MG/ML IJ SOLN
30.0000 mg | Freq: Once | INTRAMUSCULAR | Status: AC
  Administered 2015-06-19: 30 mg via INTRAVENOUS
  Filled 2015-06-19: qty 1

## 2015-06-19 MED ORDER — HYDROCODONE-ACETAMINOPHEN 5-325 MG PO TABS: 2.0000 | ORAL_TABLET | ORAL | Status: DC | PRN

## 2015-06-19 MED ORDER — PROMETHAZINE HCL 25 MG PO TABS: 25.0000 mg | ORAL_TABLET | Freq: Four times a day (QID) | ORAL | Status: DC | PRN

## 2015-06-19 MED ORDER — NAPROXEN 500 MG PO TABS: 500.0000 mg | ORAL_TABLET | Freq: Two times a day (BID) | ORAL | Status: DC

## 2015-06-19 NOTE — ED Notes (Signed)
Pt requesting pain medication.  

## 2015-06-19 NOTE — ED Notes (Signed)
Patient left ED.  

## 2015-06-19 NOTE — ED Notes (Signed)
Per Dr. Tyrone Nine, patient to have IV contrast.

## 2015-06-19 NOTE — ED Notes (Signed)
Pt back from [facility]

## 2015-06-19 NOTE — ED Notes (Signed)
MD at bedside. 

## 2015-06-19 NOTE — Discharge Instructions (Signed)

## 2015-06-19 NOTE — ED Provider Notes (Signed)
CSN: [id number]     Arrival date & time 06/19/15  0135 History  This chart was scribed for Penny Chapel, MD by Hansel Feinstein, ED Scribe. This patient was seen in room A08C/A08C and the patient's care was started at 3:48 AM.      Chief Complaint  Patient presents with  . Abdominal Pain   The history is provided by the patient. No language interpreter was used.   HPI Comments: Penny [name] is a 34 y.o. female who presents to the Emergency Department complaining of moderate, constant RLQ abdominal pain onset one week ago. She notes associated nausea, vomiting, multiple areas of redness, swelling and itching on her arms and legs onset 8 days ago. She notes that pain is worsened by palpation, lying prone and sexual contact. Pt was seen in the ED on 06/16/15 for RLQ abdominal pain as well as 12 other unrelated visits to the ED in the past 6 months. She states that she has been seen at [facility] for this problem followed by a visit to [facility] and notes that she has been unable to go to her OB/GYN due to a problem with insurance. She notes a Caesarian section in 2013.  No Hx of cholecystectomy, appendectomy, ovarian cysts. She denies constipation, fever, chills, cough, SOB, dysuria, diarrhea, bloody stools.   Past Medical History  Diagnosis Date  . Preterm labor     c/s at 36 wks  . Placenta previa     hx and with current pregnancy  . Migraines     last one over 1 month ago  . Anxiety     no meds  . Depression     no meds  . Asthma   . ADHD (attention deficit hyperactivity disorder)   . Scoliosis    Past Surgical History  Procedure Laterality Date  . Cesarean section  2010    x 1 at 36 wks in [place]  . Svd  2003    x 1 in [place]  . Wisdom tooth extraction    . Tubal ligation     Family History  Problem Relation Age of Onset  . Cancer Mother   . Hypertension Sister   . Sickle cell trait Daughter   . Asthma Son   . Asthma Paternal Grandmother   . Pseudochol deficiency Neg Hx   .  Malignant hyperthermia Neg Hx   . Anesthesia problems Neg Hx   . Hypotension Neg Hx    History  Substance Use Topics  . Smoking status: Current Every Day Smoker -- 0.25 packs/day for 12 years    Types: Cigarettes  . Smokeless tobacco: Never Used  . Alcohol Use: No     Comment: on occ   OB History    Gravida Para Term Preterm AB TAB SAB Ectopic Multiple Living   3 3 1 2  0 0 0 0 0 3     Review of Systems  Constitutional: Negative for fever and chills.  Respiratory: Negative for cough and shortness of breath.   Gastrointestinal: Positive for nausea, vomiting and abdominal pain. Negative for diarrhea, constipation and blood in stool.  Genitourinary: Negative for dysuria.  Skin: Positive for rash.  All other systems reviewed and are negative.  Allergies  Review of patient's allergies indicates no known allergies.  Home Medications   Prior to Admission medications   Medication Sig Start Date End Date Taking? Authorizing Provider  diphenhydrAMINE (BENADRYL) 25 MG tablet Take 1 tablet (25 mg total) by mouth every 6 (  six) hours as needed for itching (Rash). Patient not taking: Reported on 06/05/2015 06/01/15   Antonietta Breach, PA-C  HYDROcodone-acetaminophen (NORCO/VICODIN) 5-325 MG per tablet Take 2 tablets by mouth every 4 (four) hours as needed. 06/19/15   Penny Chapel, MD  ibuprofen (ADVIL,MOTRIN) 800 MG tablet Take 1 tablet (800 mg total) by mouth every 8 (eight) hours as needed. 06/13/15   Gavin Pound, CNM  levonorgestrel (MIRENA) 20 MCG/24HR IUD 1 each by Intrauterine route continuous.     Historical Provider, MD  naproxen (NAPROSYN) 500 MG tablet Take 1 tablet (500 mg total) by mouth 2 (two) times daily with a meal. 06/19/15   Penny Chapel, MD  oxycodone-acetaminophen (PERCOCET) 2.5-325 MG per tablet Take 1-2 tablets by mouth every 6 (six) hours as needed for pain. Patient not taking: Reported on 06/16/2015 06/13/15   Gavin Pound, CNM  promethazine (PHENERGAN) 25 MG tablet Take 1 tablet  (25 mg total) by mouth every 6 (six) hours as needed for nausea or vomiting. 06/19/15   Penny Chapel, MD   BP 121/81 mmHg  Pulse 102  Temp(Src) 98.5 F (36.9 C) (Oral)  Resp 22  SpO2 98%  LMP 05/29/2015 Physical Exam  Constitutional: She appears well-developed and well-nourished. No distress.  HENT:  Head: Normocephalic and atraumatic.  Mouth/Throat: Oropharynx is clear and moist. No oropharyngeal exudate.  Eyes: Conjunctivae and EOM are normal. Pupils are equal, round, and reactive to light. Right eye exhibits no discharge. Left eye exhibits no discharge. No scleral icterus.  Neck: Normal range of motion. Neck supple. No JVD present. No thyromegaly present.  Cardiovascular: Normal rate, regular rhythm, normal heart sounds and intact distal pulses.  Exam reveals no gallop and no friction rub.   No murmur heard. Pulmonary/Chest: Effort normal and breath sounds normal. No respiratory distress. She has no wheezes. She has no rales.  Abdominal: Soft. Bowel sounds are normal. She exhibits no distension and no mass. There is tenderness.  Right lower pelvic and inguinal tenderness without masses   Genitourinary:  Differed pelvic exam since pelvic was done 3 days ago.  Musculoskeletal: Normal range of motion. She exhibits no edema or tenderness.  Lymphadenopathy:    She has no cervical adenopathy.  Neurological: She is alert. Coordination normal.  Skin: Skin is warm and dry. No rash noted. No erythema.  Psychiatric: She has a normal mood and affect. Her behavior is normal.  Nursing note and vitals reviewed.  ED Course  Procedures (including critical care time) DIAGNOSTIC STUDIES: Oxygen Saturation is 98% on RA, normal by my interpretation.    COORDINATION OF CARE: 3:55 AM Discussed treatment plan with pt at bedside and pt agreed to plan.   Labs Review Labs Reviewed  COMPREHENSIVE METABOLIC PANEL - Abnormal; Notable for the following:    Calcium 8.7 (*)    All other components within  normal limits  CBC - Abnormal; Notable for the following:    WBC 13.7 (*)    RBC 5.22 (*)    Hemoglobin 15.6 (*)    All other components within normal limits  URINALYSIS, ROUTINE W REFLEX MICROSCOPIC (NOT AT [facility]) - Abnormal; Notable for the following:    APPearance CLOUDY (*)    Hgb urine dipstick TRACE (*)    All other components within normal limits  URINE MICROSCOPIC-ADD ON - Abnormal; Notable for the following:    Squamous Epithelial / LPF MANY (*)    All other components within normal limits  LIPASE, BLOOD  POC URINE PREG, ED  Imaging Review US Transvaginal Non-ob  06/19/2015   CLINICAL DATA:  Pelvic pain. RIGHT lower quadrant pain beginning last week with nausea, vomiting. G3 P3.  EXAM: TRANSABDOMINAL AND TRANSVAGINAL ULTRASOUND OF PELVIS  DOPPLER ULTRASOUND OF OVARIES  TECHNIQUE: Both transabdominal and transvaginal ultrasound examinations of the pelvis were performed. Transabdominal technique was performed for global imaging of the pelvis including uterus, ovaries, adnexal regions, and pelvic cul-de-sac.  It was necessary to proceed with endovaginal exam following the transabdominal exam to visualize the endometrium and adnexum. Color and duplex Doppler ultrasound was utilized to evaluate blood flow to the ovaries.  COMPARISON:  CT abdomen and pelvis November 29, 2014  FINDINGS: Uterus  Measurements: 6.4 x 6.7 x 9.2 cm. No fibroids or other mass visualized.  Endometrium  Thickness: 9 mm. No focal abnormality visualized. Linear echogenic intrauterine device in place.  Right ovary  Measurements: 3.2 x 1.5 x 2.2 cm. Normal appearance/no adnexal mass.  Left ovary  Measurements: 3.8 x 2.8 x 3.4 cm. Normal appearance/no adnexal mass. Dominant 19 mm anechoic follicle.  Pulsed Doppler evaluation of both ovaries demonstrates normal low-resistance arterial and venous waveforms.  Other findings  No free fluid.  IMPRESSION: Normal pelvic ultrasound, IUD in place.   Electronically Signed   By:  Elon Alas M.D.   On: 06/19/2015 06:20   US Pelvis Complete  06/19/2015   CLINICAL DATA:  Pelvic pain. RIGHT lower quadrant pain beginning last week with nausea, vomiting. G3 P3.  EXAM: TRANSABDOMINAL AND TRANSVAGINAL ULTRASOUND OF PELVIS  DOPPLER ULTRASOUND OF OVARIES  TECHNIQUE: Both transabdominal and transvaginal ultrasound examinations of the pelvis were performed. Transabdominal technique was performed for global imaging of the pelvis including uterus, ovaries, adnexal regions, and pelvic cul-de-sac.  It was necessary to proceed with endovaginal exam following the transabdominal exam to visualize the endometrium and adnexum. Color and duplex Doppler ultrasound was utilized to evaluate blood flow to the ovaries.  COMPARISON:  CT abdomen and pelvis November 29, 2014  FINDINGS: Uterus  Measurements: 6.4 x 6.7 x 9.2 cm. No fibroids or other mass visualized.  Endometrium  Thickness: 9 mm. No focal abnormality visualized. Linear echogenic intrauterine device in place.  Right ovary  Measurements: 3.2 x 1.5 x 2.2 cm. Normal appearance/no adnexal mass.  Left ovary  Measurements: 3.8 x 2.8 x 3.4 cm. Normal appearance/no adnexal mass. Dominant 19 mm anechoic follicle.  Pulsed Doppler evaluation of both ovaries demonstrates normal low-resistance arterial and venous waveforms.  Other findings  No free fluid.  IMPRESSION: Normal pelvic ultrasound, IUD in place.   Electronically Signed   By: Elon Alas M.D.   On: 06/19/2015 06:20   [place] Art/ven Flow Abd Pelv Doppler  06/19/2015   CLINICAL DATA:  Pelvic pain. RIGHT lower quadrant pain beginning last week with nausea, vomiting. G3 P3.  EXAM: TRANSABDOMINAL AND TRANSVAGINAL ULTRASOUND OF PELVIS  DOPPLER ULTRASOUND OF OVARIES  TECHNIQUE: Both transabdominal and transvaginal ultrasound examinations of the pelvis were performed. Transabdominal technique was performed for global imaging of the pelvis including uterus, ovaries, adnexal regions, and pelvic cul-de-sac.   It was necessary to proceed with endovaginal exam following the transabdominal exam to visualize the endometrium and adnexum. Color and duplex Doppler ultrasound was utilized to evaluate blood flow to the ovaries.  COMPARISON:  CT abdomen and pelvis November 29, 2014  FINDINGS: Uterus  Measurements: 6.4 x 6.7 x 9.2 cm. No fibroids or other mass visualized.  Endometrium  Thickness: 9 mm. No focal abnormality visualized. Linear echogenic intrauterine  device in place.  Right ovary  Measurements: 3.2 x 1.5 x 2.2 cm. Normal appearance/no adnexal mass.  Left ovary  Measurements: 3.8 x 2.8 x 3.4 cm. Normal appearance/no adnexal mass. Dominant 19 mm anechoic follicle.  Pulsed Doppler evaluation of both ovaries demonstrates normal low-resistance arterial and venous waveforms.  Other findings  No free fluid.  IMPRESSION: Normal pelvic ultrasound, IUD in place.   Electronically Signed   By: Elon Alas M.D.   On: 06/19/2015 06:20      MDM   Final diagnoses:  Pelvic pain in female  Abdominal pain  Abdominal pain    The pt has normal labs other than a WBC which is elevated US shows no cyst and no free pelvic fluid - no etiology of pain is seen CT ordered to r/o appy, At change of shift care signed out to oncoming EDP.  I personally performed the services described in this documentation, which was scribed in my presence. The recorded information has been reviewed and is accurate.    Penny Chapel, MD 06/19/15 [phone]

## 2015-06-19 NOTE — ED Notes (Signed)
Pt. reports persistent RLQ pain with nausea and vomitting onset last week , denies diarrhea / no fever or chills.

## 2015-06-30 ENCOUNTER — Encounter (HOSPITAL_COMMUNITY): Payer: Self-pay | Admitting: Emergency Medicine

## 2015-06-30 ENCOUNTER — Emergency Department (HOSPITAL_COMMUNITY)
Admission: EM | Admit: 2015-06-30 | Discharge: 2015-06-30 | Disposition: A | Discharge status: Home / Self Care | Payer: Medicaid Other | Attending: Emergency Medicine | Admitting: Emergency Medicine

## 2015-06-30 DIAGNOSIS — Z8679 Personal history of other diseases of the circulatory system: Secondary | ICD-10-CM | POA: Insufficient documentation

## 2015-06-30 DIAGNOSIS — Z8751 Personal history of pre-term labor: Secondary | ICD-10-CM | POA: Insufficient documentation

## 2015-06-30 DIAGNOSIS — R1031 Right lower quadrant pain: Secondary | ICD-10-CM | POA: Diagnosis not present

## 2015-06-30 DIAGNOSIS — Z8659 Personal history of other mental and behavioral disorders: Secondary | ICD-10-CM | POA: Diagnosis not present

## 2015-06-30 DIAGNOSIS — J45909 Unspecified asthma, uncomplicated: Secondary | ICD-10-CM | POA: Diagnosis not present

## 2015-06-30 DIAGNOSIS — Z3202 Encounter for pregnancy test, result negative: Secondary | ICD-10-CM | POA: Diagnosis not present

## 2015-06-30 DIAGNOSIS — Z720 Tobacco use: Secondary | ICD-10-CM | POA: Insufficient documentation

## 2015-06-30 DIAGNOSIS — R1030 Lower abdominal pain, unspecified: Secondary | ICD-10-CM | POA: Diagnosis present

## 2015-06-30 DIAGNOSIS — R109 Unspecified abdominal pain: Secondary | ICD-10-CM

## 2015-06-30 LAB — COMPREHENSIVE METABOLIC PANEL
ALT: 14 U/L (ref 14–54)
AST: 16 U/L (ref 15–41)
Albumin: 3.5 g/dL (ref 3.5–5.0)
Alkaline Phosphatase: 63 U/L (ref 38–126)
Anion gap: 9 (ref 5–15)
BUN: 9 mg/dL (ref 6–20)
CO2: 21 mmol/L — ABNORMAL LOW (ref 22–32)
Calcium: 8.9 mg/dL (ref 8.9–10.3)
Chloride: 108 mmol/L (ref 101–111)
Creatinine, Ser: 0.57 mg/dL (ref 0.44–1.00)
GFR calc Af Amer: >60 mL/min (ref >60)
GFR calc non Af Amer: >60 mL/min (ref >60)
Glucose, Bld: 104 mg/dL — ABNORMAL HIGH (ref 65–99)
Potassium: 3.4 mmol/L — ABNORMAL LOW (ref 3.5–5.1)
Sodium: 138 mmol/L (ref 135–145)
Total Bilirubin: 0.6 mg/dL (ref 0.3–1.2)
Total Protein: 6.4 g/dL — ABNORMAL LOW (ref 6.5–8.1)

## 2015-06-30 LAB — CBC WITH DIFFERENTIAL/PLATELET
Basophils Absolute: 0.1 10*3/uL (ref 0.0–0.1)
Basophils Relative: 1 % (ref 0–1)
Eosinophils Absolute: 0.3 10*3/uL (ref 0.0–0.7)
Eosinophils Relative: 3 % (ref 0–5)
HCT: 40.5 % (ref 36.0–46.0)
Hemoglobin: 14.1 g/dL (ref 12.0–15.0)
Lymphocytes Relative: 24 % (ref 12–46)
Lymphs Abs: 2.8 10*3/uL (ref 0.7–4.0)
MCH: 29.6 pg (ref 26.0–34.0)
MCHC: 34.8 g/dL (ref 30.0–36.0)
MCV: 84.9 fL (ref 78.0–100.0)
Monocytes Absolute: 0.5 10*3/uL (ref 0.1–1.0)
Monocytes Relative: 4 % (ref 3–12)
Neutro Abs: 7.9 10*3/uL — ABNORMAL HIGH (ref 1.7–7.7)
Neutrophils Relative %: 68 % (ref 43–77)
Platelets: 286 10*3/uL (ref 150–400)
RBC: 4.77 MIL/uL (ref 3.87–5.11)
RDW: 13.4 % (ref 11.5–15.5)
WBC: 11.6 10*3/uL — ABNORMAL HIGH (ref 4.0–10.5)

## 2015-06-30 LAB — URINALYSIS, ROUTINE W REFLEX MICROSCOPIC
Bilirubin Urine: NEGATIVE
Glucose, UA: NEGATIVE mg/dL
Ketones, ur: NEGATIVE mg/dL
Leukocytes, UA: NEGATIVE
Nitrite: NEGATIVE
Protein, ur: NEGATIVE mg/dL
Specific Gravity, Urine: 1.025 (ref 1.005–1.030)
Urobilinogen, UA: 0.2 mg/dL (ref 0.0–1.0)
pH: 6 (ref 5.0–8.0)

## 2015-06-30 LAB — POC URINE PREG, ED: Preg Test, Ur: NEGATIVE

## 2015-06-30 LAB — LIPASE, BLOOD: Lipase: 32 U/L (ref 22–51)

## 2015-06-30 LAB — URINE MICROSCOPIC-ADD ON

## 2015-06-30 MED ORDER — KETOROLAC TROMETHAMINE 30 MG/ML IJ SOLN
30.0000 mg | Freq: Once | INTRAMUSCULAR | Status: AC
  Administered 2015-06-30: 30 mg via INTRAVENOUS
  Filled 2015-06-30: qty 1

## 2015-06-30 MED ORDER — ETODOLAC 500 MG PO TABS: 500.0000 mg | ORAL_TABLET | Freq: Two times a day (BID) | ORAL | Status: DC

## 2015-06-30 NOTE — ED Notes (Signed)
Patient presents to ED after transport via [place] with complaints of RLQ pain, nausea and vomiting since 06/18/15 which she states has increased since that time. Rates right lower abdominal pain as 9/10 on 0-10 pain scale. Denies diarrhea, but reports nausea/vomiting. Denies vomiting in past 24 hours.

## 2015-06-30 NOTE — ED Provider Notes (Signed)
CSN: [id number]     Arrival date & time 06/30/15  0603 History   First MD Initiated Contact with Patient 06/30/15 [phone]     Chief Complaint  Patient presents with  . Abdominal Pain    HPI Pt started having pain in her lower abdomen on August 2dd.  She has had persistent sharp pain in the right lower abdomen.  The patient has tried multiple pain medications including Tylenol, ibuprofen, hydrocodone, and oxycodone without relief. She denies any trouble with vomiting or diarrhea. She denies any trouble with dysuria. Patient was seen in the emergency room on August 2nd 5 and had an evaluation that included a pelvic exam, abdominal pelvic CT and pelvic US. The patient has not been able to follow-up with her primary care provider or gynecologist because of financial/insurance issues.  She comes back to the emergency room today because of this persistent pain that is unrelieved. Past Medical History  Diagnosis Date  . Preterm labor     c/s at 36 wks  . Placenta previa     hx and with current pregnancy  . Migraines     last one over 1 month ago  . Anxiety     no meds  . Depression     no meds  . Asthma   . ADHD (attention deficit hyperactivity disorder)   . Scoliosis    Past Surgical History  Procedure Laterality Date  . Cesarean section  2010    x 1 at 36 wks in [place]  . Svd  2003    x 1 in [place]  . Wisdom tooth extraction    . Tubal ligation     Family History  Problem Relation Age of Onset  . Cancer Mother   . Hypertension Sister   . Sickle cell trait Daughter   . Asthma Son   . Asthma Paternal Grandmother   . Pseudochol deficiency Neg Hx   . Malignant hyperthermia Neg Hx   . Anesthesia problems Neg Hx   . Hypotension Neg Hx    Social History  Substance Use Topics  . Smoking status: Current Every Day Smoker -- 0.25 packs/day for 12 years    Types: Cigarettes  . Smokeless tobacco: Never Used  . Alcohol Use: No     Comment: on occ   OB History    Gravida Para Term  Preterm AB TAB SAB Ectopic Multiple Living   3 3 1 2  0 0 0 0 0 3     Review of Systems  Gastrointestinal: Negative for vomiting, diarrhea and constipation.  Genitourinary: Positive for vaginal pain. Negative for vaginal bleeding and vaginal discharge.  All other systems reviewed and are negative.     Allergies  Review of patient's allergies indicates no known allergies.  Home Medications   Prior to Admission medications   Medication Sig Start Date End Date Taking? Authorizing Provider  HYDROcodone-acetaminophen (NORCO/VICODIN) 5-325 MG per tablet Take 2 tablets by mouth every 4 (four) hours as needed. 06/19/15  Yes Noemi Chapel, MD  ibuprofen (ADVIL,MOTRIN) 800 MG tablet Take 1 tablet (800 mg total) by mouth every 8 (eight) hours as needed. Patient taking differently: Take 800 mg by mouth every 8 (eight) hours as needed for moderate pain.  06/13/15  Yes Gavin Pound, CNM  levonorgestrel (MIRENA) 20 MCG/24HR IUD 1 each by Intrauterine route continuous.    Yes Historical Provider, MD  naproxen (NAPROSYN) 500 MG tablet Take 1 tablet (500 mg total) by mouth 2 (two) times daily  with a meal. 06/19/15  Yes Noemi Chapel, MD  promethazine (PHENERGAN) 25 MG tablet Take 1 tablet (25 mg total) by mouth every 6 (six) hours as needed for nausea or vomiting. 06/19/15  Yes Noemi Chapel, MD  cephALEXin (KEFLEX) 500 MG capsule Take 1 capsule (500 mg total) by mouth 4 (four) times daily. Patient not taking: Reported on 06/30/2015 06/19/15   Deno Etienne, DO  diphenhydrAMINE (BENADRYL) 25 MG tablet Take 1 tablet (25 mg total) by mouth every 6 (six) hours as needed for itching (Rash). Patient not taking: Reported on 06/05/2015 06/01/15   Antonietta Breach, PA-C  oxycodone-acetaminophen (PERCOCET) 2.5-325 MG per tablet Take 1-2 tablets by mouth every 6 (six) hours as needed for pain. Patient not taking: Reported on 06/16/2015 06/13/15   Gavin Pound, CNM   BP 121/91 mmHg  Pulse 88  Temp(Src) 99 F (37.2 C) (Oral)  Ht 5\' 4"   (1.626 m)  Wt 183 lb (83.008 kg)  BMI 31.40 kg/m2  SpO2 98%  LMP 05/29/2015 Physical Exam  Constitutional: She appears well-developed and well-nourished. No distress.  HENT:  Head: Normocephalic and atraumatic.  Right Ear: External ear normal.  Left Ear: External ear normal.  Eyes: Conjunctivae are normal. Right eye exhibits no discharge. Left eye exhibits no discharge. No scleral icterus.  Neck: Neck supple. No tracheal deviation present.  Cardiovascular: Normal rate, regular rhythm and intact distal pulses.   Pulmonary/Chest: Effort normal and breath sounds normal. No stridor. No respiratory distress. She has no wheezes. She has no rales.  Abdominal: Soft. Bowel sounds are normal. She exhibits no distension. There is tenderness (mild right lower quadrant). There is no rebound and no guarding.  No guarding, no peritoneal signs, no mass, no hernia  Musculoskeletal: She exhibits no edema or tenderness.  Neurological: She is alert. She has normal strength. No cranial nerve deficit (no facial droop, extraocular movements intact, no slurred speech) or sensory deficit. She exhibits normal muscle tone. She displays no seizure activity. Coordination normal.  Skin: Skin is warm and dry. No rash noted.  Psychiatric: She has a normal mood and affect.  Nursing note and vitals reviewed.   ED Course  Procedures (including critical care time) Labs Review Labs Reviewed - No data to display  CT scan from Aug 5 IMPRESSION: 1. No evidence of bowel obstruction or acute bowel inflammation. Normal appendix. 2. The right-sided tubal ligation clip is located within the pelvic cul-de-sac abutting the anterior rectal wall, and appears malpositioned, a finding of uncertain clinical significance. Normally positioned left tubal ligation clip. No abnormal free fluid in the pelvis. No focal fluid collection. No abnormal adnexal masses. Electronically Signed  By: Ilona Sorrel M.D.  On: 06/19/2015  09:09  [place] from Aug 5 IMPRESSION: Normal pelvic ultrasound, IUD in place. Electronically Signed  By: Elon Alas M.D.  On: 06/19/2015 06:20   EKG Interpretation None      MDM   Final diagnoses:  Abdominal pain, unspecified abdominal location    Previous records were reviewed. The patient did have an abdominal pelvic CT performed on August 5. There were no acute findings other than a comment about an abnormally located staple associated with her tubal ligation. This was of uncertain significance.  Labs today are normal.  Reviewed prior imaging tests and labs including cervical swabs performed previously.  Discussed importance of follow up with an OB GYN  At this time there does not appear to be any evidence of an acute emergency medical condition and the patient  appears stable for discharge with appropriate outpatient follow up.     Dorie Rank, MD 06/30/15 1125

## 2015-06-30 NOTE — Discharge Instructions (Signed)
Abdominal Pain, Women °Abdominal (stomach, pelvic, or belly) pain can be caused by many things. It is important to tell your doctor: °· The location of the pain. °· Does it come and go or is it present all the time? °· Are there things that start the pain (eating certain foods, exercise)? °· Are there other symptoms associated with the pain (fever, nausea, vomiting, diarrhea)? °All of this is helpful to know when trying to find the cause of the pain. °CAUSES  °· Stomach: virus or bacteria infection, or ulcer. °· Intestine: appendicitis (inflamed appendix), regional ileitis (Crohn's disease), ulcerative colitis (inflamed colon), irritable bowel syndrome, diverticulitis (inflamed diverticulum of the colon), or cancer of the stomach or intestine. °· Gallbladder disease or stones in the gallbladder. °· Kidney disease, kidney stones, or infection. °· Pancreas infection or cancer. °· Fibromyalgia (pain disorder). °· Diseases of the female organs: °¨ Uterus: fibroid (non-cancerous) tumors or infection. °¨ Fallopian tubes: infection or tubal pregnancy. °¨ Ovary: cysts or tumors. °¨ Pelvic adhesions (scar tissue). °¨ Endometriosis (uterus lining tissue growing in the pelvis and on the pelvic organs). °¨ Pelvic congestion syndrome (female organs filling up with blood just before the menstrual period). °¨ Pain with the menstrual period. °¨ Pain with ovulation (producing an egg). °¨ Pain with an IUD (intrauterine device, birth control) in the uterus. °¨ Cancer of the female organs. °· Functional pain (pain not caused by a disease, may improve without treatment). °· Psychological pain. °· Depression. °DIAGNOSIS  °Your doctor will decide the seriousness of your pain by doing an examination. °· Blood tests. °· X-rays. °· Ultrasound. °· CT scan (computed tomography, special type of X-ray). °· MRI (magnetic resonance imaging). °· Cultures, for infection. °· Barium enema (dye inserted in the large intestine, to better view it with  X-rays). °· Colonoscopy (looking in intestine with a lighted tube). °· Laparoscopy (minor surgery, looking in abdomen with a lighted tube). °· Major abdominal exploratory surgery (looking in abdomen with a large incision). °TREATMENT  °The treatment will depend on the cause of the pain.  °· Many cases can be observed and treated at home. °· Over-the-counter medicines recommended by your caregiver. °· Prescription medicine. °· Antibiotics, for infection. °· Birth control pills, for painful periods or for ovulation pain. °· Hormone treatment, for endometriosis. °· Nerve blocking injections. °· Physical therapy. °· Antidepressants. °· Counseling with a psychologist or psychiatrist. °· Minor or major surgery. °HOME CARE INSTRUCTIONS  °· Do not take laxatives, unless directed by your caregiver. °· Take over-the-counter pain medicine only if ordered by your caregiver. Do not take aspirin because it can cause an upset stomach or bleeding. °· Try a clear liquid diet (broth or water) as ordered by your caregiver. Slowly move to a bland diet, as tolerated, if the pain is related to the stomach or intestine. °· Have a thermometer and take your temperature several times a day, and record it. °· Bed rest and sleep, if it helps the pain. °· Avoid sexual intercourse, if it causes pain. °· Avoid stressful situations. °· Keep your follow-up appointments and tests, as your caregiver orders. °· If the pain does not go away with medicine or surgery, you may try: °¨ Acupuncture. °¨ Relaxation exercises (yoga, meditation). °¨ Group therapy. °¨ Counseling. °SEEK MEDICAL CARE IF:  °· You notice certain foods cause stomach pain. °· Your home care treatment is not helping your pain. °· You need stronger pain medicine. °· You want your IUD removed. °· You feel faint or   lightheaded. °· You develop nausea and vomiting. °· You develop a rash. °· You are having side effects or an allergy to your medicine. °SEEK IMMEDIATE MEDICAL CARE IF:  °· Your  pain does not go away or gets worse. °· You have a fever. °· Your pain is felt only in portions of the abdomen. The right side could possibly be appendicitis. The left lower portion of the abdomen could be colitis or diverticulitis. °· You are passing blood in your stools (bright red or black tarry stools, with or without vomiting). °· You have blood in your urine. °· You develop chills, with or without a fever. °· You pass out. °MAKE SURE YOU:  °· Understand these instructions. °· Will watch your condition. °· Will get help right away if you are not doing well or get worse. °Document Released: 08/28/2007 Document Revised: 03/17/2014 Document Reviewed: 09/17/2009 °Exit[employer]® Patient Information ©2015 [employer]. This information is not intended to replace advice given to you by your health care provider. Make sure you discuss any questions you have with your health care provider. ° °

## 2015-06-30 NOTE — ED Notes (Signed)
Patient arrived via [facility]. Report received from [facility]: patient walked to [facility] truck after calling to request to be taken to ED for flank pain. Patient reports right flank pain which began 06/18/15, and steadily increased. VSS. BP 120/60, Pulse 72, Resp 18-20, 99% SPO2 on room air. Denied nausea/vomiting.

## 2015-06-30 NOTE — ED Notes (Signed)
Pt verbalizes understanding of discharge instructions. NAD on departure. Ambulatory with steady gait. Waiting in waiting room for transportation to come pick her up. A/O x4.

## 2015-07-02 ENCOUNTER — Encounter: Payer: Self-pay | Admitting: Certified Nurse Midwife

## 2015-07-02 ENCOUNTER — Ambulatory Visit (INDEPENDENT_AMBULATORY_CARE_PROVIDER_SITE_OTHER): Payer: Medicaid Other | Admitting: Obstetrics

## 2015-07-02 VITALS — BP 114/73 | HR 76 | Temp 98.6°F | Ht 64.0 in | Wt 182.3 lb

## 2015-07-02 DIAGNOSIS — R102 Pelvic and perineal pain: Secondary | ICD-10-CM

## 2015-07-02 LAB — POCT URINE PREGNANCY: Preg Test, Ur: NEGATIVE

## 2015-07-02 MED ORDER — CYCLOBENZAPRINE HCL 10 MG PO TABS: 10.0000 mg | ORAL_TABLET | Freq: Three times a day (TID) | ORAL | Status: DC | PRN

## 2015-07-02 MED ORDER — TRAMADOL HCL 50 MG PO TABS: 50.0000 mg | ORAL_TABLET | Freq: Four times a day (QID) | ORAL | Status: DC | PRN

## 2015-07-02 NOTE — Progress Notes (Signed)
Patient ID: Penny [name], female   DOB: 10-May-1981, 34 y.o.   MRN: [id number]  Patient ID: Penny [name], female   DOB: December 16, 1980, 34 y.o.   MRN: [id number]  Chief Complaint  Patient presents with  . Pelvic Pain    new patient    HPI Penny [name] is a 34 y.o. female.  C/O pelvic pain over the last several months.  She is S/P tubal sterilization after her last delivery.  Has had CT scan and ultrasound done for work up of this pain.  Both studies were negative.  Patient desires future fertility and wants  tubal reanastomosis.  HPI  Past Medical History  Diagnosis Date  . Preterm labor     c/s at 36 wks  . Placenta previa     hx and with current pregnancy  . Migraines     last one over 1 month ago  . Anxiety     no meds  . Depression     no meds  . Asthma   . ADHD (attention deficit hyperactivity disorder)   . Scoliosis     Past Surgical History  Procedure Laterality Date  . Cesarean section  2010    x 1 at 36 wks in [place]  . Svd  2003    x 1 in [place]  . Wisdom tooth extraction    . Tubal ligation      Family History  Problem Relation Age of Onset  . Cancer Mother   . Hypertension Sister   . Sickle cell trait Daughter   . Asthma Son   . Asthma Paternal Grandmother   . Pseudochol deficiency Neg Hx   . Malignant hyperthermia Neg Hx   . Anesthesia problems Neg Hx   . Hypotension Neg Hx     Social History Social History  Substance Use Topics  . Smoking status: Current Every Day Smoker -- 0.25 packs/day for 12 years    Types: Cigarettes  . Smokeless tobacco: Never Used  . Alcohol Use: No     Comment: on occ    No Known Allergies  Current Outpatient Prescriptions  Medication Sig Dispense Refill  . etodolac (LODINE) 500 MG tablet Take 1 tablet (500 mg total) by mouth 2 (two) times daily. 20 tablet 0  . levonorgestrel (MIRENA) 20 MCG/24HR IUD 1 each by Intrauterine route continuous.     . promethazine (PHENERGAN) 25 MG tablet Take 1 tablet (25 mg  total) by mouth every 6 (six) hours as needed for nausea or vomiting. 12 tablet 0  . HYDROcodone-acetaminophen (NORCO/VICODIN) 5-325 MG per tablet Take 2 tablets by mouth every 4 (four) hours as needed. (Patient not taking: Reported on 07/02/2015) 10 tablet 0  . [DISCONTINUED] Cetirizine HCl 10 MG CAPS Take 1 capsule (10 mg total) by mouth at bedtime. 30 capsule 0  . [DISCONTINUED] fluticasone (FLONASE) 50 MCG/ACT nasal spray Place 2 sprays into both nostrils daily. (Patient not taking: Reported on 03/08/2015) 16 g 0  . [DISCONTINUED] gabapentin (NEURONTIN) 300 MG capsule Take 1 capsule (300 mg total) by mouth 3 (three) times daily. (Patient not taking: Reported on 11/24/2014) 30 capsule 1  . [DISCONTINUED] ipratropium (ATROVENT) 0.06 % nasal spray Place 2 sprays into both nostrils 4 (four) times daily. For nasal congestion (Patient not taking: Reported on 03/08/2015) 15 mL 0  . [DISCONTINUED] omeprazole (PRILOSEC) 20 MG capsule Take 1 capsule (20 mg total) by mouth daily. (Patient not taking: Reported on 11/28/2014) 30 capsule 0   No current facility-administered medications  for this visit.    Review of Systems Review of Systems Constitutional: negative for fatigue and weight loss Respiratory: negative for cough and wheezing Cardiovascular: negative for chest pain, fatigue and palpitations Gastrointestinal: negative for abdominal pain and change in bowel habits Genitourinary: positive for pelvic pain Integument/breast: negative for nipple discharge Musculoskeletal:negative for myalgias Neurological: negative for gait problems and tremors Behavioral/Psych: negative for abusive relationship, depression Endocrine: negative for temperature intolerance     Blood pressure 114/73, pulse 76, temperature 98.6 F (37 C), height 5\' 4"  (1.626 m), weight 182 lb 4.8 oz (82.691 kg), last menstrual period 05/29/2015.  Physical Exam Physical Exam: Deferred  100% of 15 min visit spent on counseling and  coordination of care.   Data Reviewed CT scan Ultrasound Hospital records from previous surgery  Assessment     Pelvic pain Desires future fertility     Plan  Referred to Reproductive Endocrinology for fertility consultation   F/U prn  No orders of the defined types were placed in this encounter.   No orders of the defined types were placed in this encounter.

## 2015-07-03 ENCOUNTER — Encounter: Payer: Self-pay | Admitting: Obstetrics

## 2015-07-08 ENCOUNTER — Ambulatory Visit: Payer: Medicaid Other | Admitting: Certified Nurse Midwife

## 2015-07-09 ENCOUNTER — Ambulatory Visit: Payer: Medicaid Other | Admitting: Certified Nurse Midwife

## 2015-07-13 ENCOUNTER — Encounter (HOSPITAL_COMMUNITY): Payer: Self-pay | Admitting: Emergency Medicine

## 2015-07-13 ENCOUNTER — Emergency Department (HOSPITAL_COMMUNITY)
Admission: EM | Admit: 2015-07-13 | Discharge: 2015-07-13 | Disposition: A | Discharge status: Home / Self Care | Payer: Medicaid Other | Attending: Emergency Medicine | Admitting: Emergency Medicine

## 2015-07-13 ENCOUNTER — Emergency Department (HOSPITAL_COMMUNITY): Payer: Medicaid Other

## 2015-07-13 DIAGNOSIS — G43909 Migraine, unspecified, not intractable, without status migrainosus: Secondary | ICD-10-CM | POA: Insufficient documentation

## 2015-07-13 DIAGNOSIS — Z8751 Personal history of pre-term labor: Secondary | ICD-10-CM | POA: Insufficient documentation

## 2015-07-13 DIAGNOSIS — Z720 Tobacco use: Secondary | ICD-10-CM | POA: Diagnosis not present

## 2015-07-13 DIAGNOSIS — Y998 Other external cause status: Secondary | ICD-10-CM | POA: Insufficient documentation

## 2015-07-13 DIAGNOSIS — F419 Anxiety disorder, unspecified: Secondary | ICD-10-CM | POA: Diagnosis not present

## 2015-07-13 DIAGNOSIS — S80862A Insect bite (nonvenomous), left lower leg, initial encounter: Secondary | ICD-10-CM | POA: Insufficient documentation

## 2015-07-13 DIAGNOSIS — Y9289 Other specified places as the place of occurrence of the external cause: Secondary | ICD-10-CM | POA: Insufficient documentation

## 2015-07-13 DIAGNOSIS — F329 Major depressive disorder, single episode, unspecified: Secondary | ICD-10-CM | POA: Insufficient documentation

## 2015-07-13 DIAGNOSIS — L03116 Cellulitis of left lower limb: Secondary | ICD-10-CM | POA: Diagnosis not present

## 2015-07-13 DIAGNOSIS — J45909 Unspecified asthma, uncomplicated: Secondary | ICD-10-CM | POA: Insufficient documentation

## 2015-07-13 DIAGNOSIS — W57XXXA Bitten or stung by nonvenomous insect and other nonvenomous arthropods, initial encounter: Secondary | ICD-10-CM | POA: Insufficient documentation

## 2015-07-13 DIAGNOSIS — Y9389 Activity, other specified: Secondary | ICD-10-CM | POA: Diagnosis not present

## 2015-07-13 MED ORDER — HYDROCODONE-ACETAMINOPHEN 5-325 MG PO TABS: 2.0000 | ORAL_TABLET | ORAL | Status: DC | PRN

## 2015-07-13 MED ORDER — CEPHALEXIN 500 MG PO CAPS: 500.0000 mg | ORAL_CAPSULE | Freq: Two times a day (BID) | ORAL | Status: DC

## 2015-07-13 MED ORDER — ONDANSETRON 4 MG PO TBDP
4.0000 mg | ORAL_TABLET | Freq: Once | ORAL | Status: AC
  Administered 2015-07-13: 4 mg via ORAL
  Filled 2015-07-13: qty 1

## 2015-07-13 MED ORDER — OXYCODONE-ACETAMINOPHEN 5-325 MG PO TABS
1.0000 | ORAL_TABLET | Freq: Once | ORAL | Status: AC
  Administered 2015-07-13: 1 via ORAL
  Filled 2015-07-13: qty 1

## 2015-07-13 NOTE — ED Notes (Signed)
Pt sts insect bites to left knee with some redness noted and itching per pt

## 2015-07-13 NOTE — Discharge Instructions (Signed)
1. Medications: keflex, vicodin, usual home medications 2. Treatment: rest, drink plenty of fluids, ice, use crutches 3. Follow Up: please followup with your primary doctor this week for discussion of your diagnoses and further evaluation after today's visit; if you do not have a primary care doctor use the resource guide provided to find one; please return to the ER for severe pain, numbness, tingling, inability to move knee, new or worsening symptoms   Cellulitis Cellulitis is an infection of the skin and the tissue under the skin. The infected area is usually red and tender. This happens most often in the arms and lower legs. HOME CARE   Take your antibiotic medicine as told. Finish the medicine even if you start to feel better.  Keep the infected arm or leg raised (elevated).  Put a warm cloth on the area up to 4 times per day.  Only take medicines as told by your doctor.  Keep all doctor visits as told. GET HELP IF:  You see red streaks on the skin coming from the infected area.  Your red area gets bigger or turns a dark color.  Your bone or joint under the infected area is painful after the skin heals.  Your infection comes back in the same area or different area.  You have a puffy (swollen) bump in the infected area.  You have new symptoms.  You have a fever. GET HELP RIGHT AWAY IF:   You feel very sleepy.  You throw up (vomit) or have watery poop (diarrhea).  You feel sick and have muscle aches and pains. MAKE SURE YOU:   Understand these instructions.  Will watch your condition.  Will get help right away if you are not doing well or get worse. Document Released: 04/18/2008 Document Revised: 03/17/2014 Document Reviewed: 01/16/2012 [facility] Patient Information 2015 [place], [place]. This information is not intended to replace advice given to you by your health care provider. Make sure you discuss any questions you have with your health care  provider.   Emergency Department Resource Guide 1) Find a Doctor and Pay Out of Pocket Although you won't have to find out who is covered by your insurance plan, it is a good idea to ask around and get recommendations. You will then need to call the office and see if the doctor you have chosen will accept you as a new patient and what types of options they offer for patients who are self-pay. Some doctors offer discounts or will set up payment plans for their patients who do not have insurance, but you will need to ask so you aren't surprised when you get to your appointment.  2) Contact Your Local Health Department Not all health departments have doctors that can see patients for sick visits, but many do, so it is worth a call to see if yours does. If you don't know where your local health department is, you can check in your phone book. The CDC also has a tool to help you locate your state's health department, and many state websites also have listings of all of their local health departments.  3) Find a [facility] If your illness is not likely to be very severe or complicated, you may want to try a walk in clinic. These are popping up all over the country in pharmacies, drugstores, and shopping centers. They're usually staffed by nurse practitioners or physician assistants that have been trained to treat common illnesses and complaints. They're usually fairly quick and inexpensive.  However, if you have serious medical issues or chronic medical problems, these are probably not your best option.  No Primary Care Doctor: - Call Health Connect at  [phone] - they can help you locate a primary care doctor that  accepts your insurance, provides certain services, etc. - Physician Referral Service- [phone]  Chronic Pain Problems: Organization         Address  Phone   Notes  [facility]  [phone] Patients need to be referred by their primary care doctor.    Medication Assistance: Organization         Address  Phone   Notes  [facility] Medication [facility] [address] [place], [place] [address] [phone] --Must be a resident of [facility] -- Must have NO insurance coverage whatsoever (no Medicaid/ Medicare, etc.) -- The pt. MUST have a primary care doctor that directs their care regularly and follows them in the community   Med[facility]ist  [phone]   [employer]  [phone]    Agencies that provide inexpensive medical care: Organization         Address  Phone   Notes  [place]  [phone]   [facility]    [phone]   [facility] [place], [place] [address] [phone]   [place] [address], [place] [phone]   Planned Parenthood    [phone]   [facility]    [phone]   [place] [address], [place] Phone:  [phone], Fax:  [phone] Hours of Operation:  9 am - 6 pm, M-F.  Also accepts Medicaid/Medicare and self-pay.  [facility] for [address], [address], [place] Phone: [phone], Fax: [phone]. Hours of Operation:  8:30 am - 5:30 pm, M-F.  Also accepts Medicaid and self-pay.  [facility] [place] [address] [place] Phone: [phone]   [place], [place], [place] [phone], Ext. [phone] Mondays & Thursdays: 7-9 AM.  First 15 patients are seen on a first come, first serve basis.    [place] Providers:  Organization         Address  Phone   Notes  [facility] [address], [place] [phone] Also accepts self-pay patients.  [facility] [address], [place]  [phone]   [place], [address], [place] [phone]   [facility] [address], [place] [phone]   Lucianne Lei [address], [place]   [phone] Only accepts [place] Access [place] patients after they have their name applied to their card.   Self-Pay (no insurance) in [facility]:  Organization         Address  Phone   Notes  Sickle Cell Patients, [facility] Internal [facility] [place] [phone]   [facility] Urgent Care [place] [phone]   [facility] [place]  [place], [place],  [phone]   [facility]/Dr. Osei-Bonsu  [address], [place] or [address], [place] [phone] Phone number for both [place] and [place] locations is the same.  Urgent Medical and [facility] [address] [place] [phone]   [facility] [address] [place] or [address] [phone]   [facility] [address], [place] [phone], phone; [phone], fax Sees patients 1st and 3rd Saturday of every month.  Must not qualify for public or private insurance (i.e. Medicaid, Medicare, [place] Health Choice, Veterans' Benefits)  Household income should be no more than 200% of the poverty level The clinic cannot treat you if you are pregnant or think you are pregnant  Sexually transmitted diseases are not treated at the clinic.    Dental Care: Organization         Address  Phone  Notes  [facility] Department of [facility] [place] [phone] Accepts children up to age 82 who are enrolled in [place] or [place]; pregnant women with a Medicaid card; and children who have applied for Medicaid or [place] Health Choice, but were declined, whose parents can pay a reduced fee at time of service.   [facility] Department of [facility]  [address], [place] [phone] Accepts children up to age 50 who are enrolled in [place] or [place]; pregnant women with a Medicaid card; and children who have applied for Medicaid or [place] Health Choice, but were declined, whose parents can pay a reduced fee at time of service.  [place] Adult Dental Access PROGRAM  [place] [phone] Patients are seen by appointment only. Walk-ins are not accepted. [place] will see patients 52 years of age and older. Monday - Tuesday (8am-5pm) Most Wednesdays (8:30-5pm) $30 per visit, cash only  [facility] Adult Dental Access PROGRAM  [address], [facility] [phone] Patients are seen by appointment only. Walk-ins are not accepted. [place] will see patients 43 years of age and older. One Wednesday Evening (Monthly: Volunteer Based).  $30 per visit, cash only  [place]  [phone] for adults; Children under age 49, call Graduate Pediatric Dentistry at [phone]. Children aged 52-14, please call [phone] to request a pediatric application.  Dental services are provided in all areas of dental care including fillings, crowns and bridges, complete and partial dentures, implants, gum treatment, root canals, and extractions. Preventive care is also provided. Treatment is provided to both adults and children. Patients are selected via a lottery and there is often a waiting list.   [facility] [address], [place]  [phone] [email or web address]   [facility] [address] [place], [place] [phone], Ext. [phone] Second and Fourth Thursday of each month, opens at 6:30 AM; Clinic ends at 9 AM.  Patients are seen on a first-come first-served basis, and a limited number are seen during each clinic.   [facility]  [address] Hillard Danker [place], [place] [phone]   Eligibility Requirements You must have lived in [place], [place], or [place] counties for at least the last three months.   You cannot be eligible for state or federal sponsored [employer], including [employer], [place], or [employer].   You generally cannot be eligible for healthcare insurance through your employer.    How to apply: Eligibility screenings are held every Tuesday and Wednesday afternoon from 1:00 pm until 4:00 pm. You  do not need an appointment for the interview!  [facility] [address] [place] [address], [place], [place]   [place]  [place] Department  [place]  [phone]    Behavioral Health Resources in the Community: Intensive Outpatient Programs Organization         Address  Phone  Notes  Nord Stafford. [address] [place], [place] [phone]   [facility] Outpatient [address] [place], [place]   ADS: Alcohol & Drug Svcs [address] [place], [place]   [place] [address]  [place], [place] or [phone]   Substance Abuse Resources Organization         Address  Phone  Notes  Alcohol and Drug Services  [phone]   [place]  [phone]   [facility] [place]   Chinita Pester  [phone]   Residential & Outpatient Substance Abuse Program  [phone]   Psychological Services Organization         Address  Phone  Notes  [facility] [place]  [place]  [phone]   [place] [address], [place] or [phone]    Mobile Crisis Teams Organization         Address  Phone  Notes  [facility], Mobile Crisis Care Unit  [phone]   [facility]  [address] [place], [place]   [place] Levels [address] [name] [phone]    Self-Help/Support Groups Organization         Address  Phone             Notes  [place]. of [place] - variety of support groups  [place] Call for more information  Narcotics Anonymous (NA), [facility] [address], [employer] [place]  2 meetings at this location   Special educational needs teacher         Address  Phone  Notes  [facility]idential [facility] [place],    [place]  [phone]   [facility]  [address] [place] [address], [place], [place]   [place], [place] [phone] Admissions: 8am-3pm M-F  Incentives Substance [place] [address],    [place], [place] [phone]   [facility] [address] [place], [place], [place]   [facility] [address]  [place], [place]   [facility] - Intensive Outpatient [address], [place], [place]   [facility].,  [place], [place] [phone] or [phone]   [facility]idential Treatment Services (RTS) [address] [place], [place] Accepts Medicaid  [facility] [address]  [place] [phone] Substance Abuse/Addiction Treatment   [facility] Organization         Address  Phone  Notes  [facility]  [phone]   Domenic Schwab, PhD [address] Arlis Porta [place], [place]   [phone] or [phone]   [place] Yellow Medicine [place], [place] [phone]   [place] [address], [place], [place] [phone] Insurance/Medicaid/sponsorship through [employer] and Families [address], [address]  [place], [place] [phone]  [place], [place] [phone]    Dr. Adele Schilder  [phone]   [facility] Dept. 1) [address] [place] 2) [place] 3)  [place] [address], [place] [phone]  [phone]   [place] [phone] or [phone] (After Hours)

## 2015-07-13 NOTE — ED Notes (Signed)
Pt stable, ambulatory, states understanding of discharge instructions 

## 2015-07-13 NOTE — ED Provider Notes (Signed)
CSN: [id number]     Arrival date & time 07/13/15  1751 History   First MD Initiated Contact with Patient 07/13/15 [place]     Chief Complaint  Patient presents with  . Insect Bite    HPI    Penny [name] is a 34 y.o. female with a PMH of asthma, anxiety, and depression who presents to the ED with insect bites and left knee pain. She reports she spent time outside on Friday, and noticed insect bites to her left lower extremity on Saturday. She reports her bites have become increasingly swollen, painful, and itchy since that time. She has tried topical benadryl for symptom relief, which has not been effective. She also reports left knee pain, and reports her pain is constant and is exacerbated by movement. She has tried flexeril and tramadol for symptom relief, which she reports has not helped. Denies fever, chills, headache, lightheadedness, dizziness, chest pain, shortness of breath, abdominal pain, nausea, vomiting, diarrhea, numbness, paresthesia.    Past Medical History  Diagnosis Date  . Preterm labor     c/s at 36 wks  . Placenta previa     hx and with current pregnancy  . Migraines     last one over 1 month ago  . Anxiety     no meds  . Depression     no meds  . Asthma   . ADHD (attention deficit hyperactivity disorder)   . Scoliosis    Past Surgical History  Procedure Laterality Date  . Cesarean section  2010    x 1 at 36 wks in [place]  . Svd  [place]    [place] 1 [place]  . Wisdom tooth extraction    . Tubal ligation     Family History  Problem Relation Age of Onset  . Cancer Mother   . Hypertension Sister   . Sickle cell trait Daughter   . Asthma Son   . Asthma Paternal Grandmother   . Pseudochol deficiency Neg Hx   . Malignant hyperthermia Neg Hx   . Anesthesia problems Neg Hx   . Hypotension Neg Hx    Social History  Substance Use Topics  . Smoking status: Current Every Day Smoker -- 0.25 packs/day for 12 years    Types: Cigarettes  . Smokeless tobacco: Never  Used  . Alcohol Use: No     Comment: on occ   OB History    Gravida Para Term Preterm AB TAB SAB Ectopic Multiple Living   3 3 1 2  0 0 0 0 0 3      Review of Systems  Constitutional: Negative for fever, chills, activity change, appetite change and fatigue.  HENT: Negative for congestion.   Eyes: Negative for visual disturbance.  Respiratory: Negative for cough and shortness of breath.   Cardiovascular: Negative for chest pain, palpitations and leg swelling.  Gastrointestinal: Negative for nausea, vomiting, abdominal pain, diarrhea, constipation and abdominal distention.  Genitourinary: Negative for dysuria, urgency and frequency.  Musculoskeletal: Positive for arthralgias. Negative for myalgias, back pain, neck pain and neck stiffness.  Skin: Positive for color change and wound. Negative for pallor and rash.  Neurological: Negative for dizziness, syncope, weakness, light-headedness, numbness and headaches.  All other systems reviewed and are negative.     Allergies  Review of patient's allergies indicates no known allergies.  Home Medications   Prior to Admission medications   Medication Sig Start Date End Date Taking? Authorizing Provider  cyclobenzaprine (FLEXERIL) 10 MG tablet Take  1 tablet (10 mg total) by mouth 3 (three) times daily as needed for muscle spasms. 07/02/15  Yes Rachelle A Denney, CNM  levonorgestrel (MIRENA) 20 MCG/24HR IUD 1 each by Intrauterine route continuous.    Yes Historical Provider, MD  traMADol (ULTRAM) 50 MG tablet Take 1 tablet (50 mg total) by mouth every 6 (six) hours as needed. 07/02/15  Yes Rachelle A Denney, CNM  cephALEXin (KEFLEX) 500 MG capsule Take 1 capsule (500 mg total) by mouth 2 (two) times daily. 07/13/15   Marella Chimes, PA-C  etodolac (LODINE) 500 MG tablet Take 1 tablet (500 mg total) by mouth 2 (two) times daily. Patient not taking: Reported on 07/13/2015 06/30/15   Dorie Rank, MD  HYDROcodone-acetaminophen (NORCO/VICODIN)  5-325 MG per tablet Take 2 tablets by mouth every 4 (four) hours as needed. 07/13/15   Marella Chimes, PA-C  promethazine (PHENERGAN) 25 MG tablet Take 1 tablet (25 mg total) by mouth every 6 (six) hours as needed for nausea or vomiting. Patient not taking: Reported on 07/13/2015 06/19/15   Noemi Chapel, MD    BP 111/70 mmHg  Pulse 84  Temp(Src) 98.2 F (36.8 C) (Oral)  Resp 18  SpO2 98%  LMP 05/29/2015 (Exact Date) Physical Exam  Constitutional: She is oriented to person, place, and time. She appears well-developed and well-nourished. No distress.  HENT:  Head: Normocephalic and atraumatic.  Right Ear: External ear normal.  Left Ear: External ear normal.  Nose: Nose normal.  Mouth/Throat: Uvula is midline, oropharynx is clear and moist and mucous membranes are normal.  Eyes: Conjunctivae, EOM and lids are normal. Pupils are equal, round, and reactive to light. Right eye exhibits no discharge. Left eye exhibits no discharge. No scleral icterus.  Neck: Normal range of motion. Neck supple.  Cardiovascular: Normal rate, regular rhythm, normal heart sounds, intact distal pulses and normal pulses.   Pulmonary/Chest: Effort normal and breath sounds normal. No respiratory distress.  Abdominal: Soft. Normal appearance and bowel sounds are normal. She exhibits no distension and no mass. There is no tenderness. There is no rigidity, no rebound and no guarding.  Musculoskeletal: She exhibits edema and tenderness.       Left knee: She exhibits decreased range of motion, swelling and erythema. She exhibits no LCL laxity and no MCL laxity. Tenderness found.  Left knee diffusely tender to palpation with erythema and warmth. Decreased range of motion of left lower extremity at left knee due to pain.  Neurological: She is alert and oriented to person, place, and time. She has normal strength. No sensory deficit.  Skin: Skin is warm, dry and intact. No rash noted. She is not diaphoretic. There is  erythema. No pallor.  Multiple insect bites to left lower extremity with surrounding erythema.   Psychiatric: She has a normal mood and affect. Her speech is normal and behavior is normal. Judgment and thought content normal.  Nursing note and vitals reviewed.   ED Course  Procedures (including critical care time)  Labs Review Labs Reviewed - No data to display  Imaging Review Dg Knee Complete 4 Views Left  07/13/2015   CLINICAL DATA:  Left knee swelling after bite of unknown animal or insect 2 days ago  EXAM: LEFT KNEE - COMPLETE 4+ VIEW  COMPARISON:  03/03/2014  FINDINGS: Mild prepatellar soft tissue swelling. Otherwise negative with no fracture dislocation or effusion.  IMPRESSION: Mild nonspecific soft tissue swelling in the prepatellar region.   Electronically Signed   By: Kyung Rudd  Rubner M.D.   On: 07/13/2015 19:30   I have personally reviewed and evaluated these images as part of my medical decision-making.   EKG Interpretation None      MDM   Final diagnoses:  Cellulitis of left lower extremity    34 year old female presents with insect bites to her left lower extremity and left knee pain since Saturday. Denies fever, chills, headache, lightheadedness, dizziness, chest pain, shortness of breath, abdominal pain, nausea, vomiting, diarrhea, numbness, paresthesia.   Patient is afebrile. Vital signs stable. Multiple insect bites to left lower extremity with surrounding erythema. TTP of left knee with erythema and warmth. Decreased range of motion of left knee due to pain. Strength and sensation intact. Distal pulses intact.  Imaging of left knee demonstrates mild nonspecific soft tissue swelling in prepatellar region. No fracture, dislocation, or effusion.  Symptoms most likely consistent with cellulitis. Will treat with keflex. Pain controlled in the ED with percocet. Will discharge home with vicodin. Given crutches to assist with ambulation. Patient to follow-up with PCP.  Return precautions discussed.   BP 111/70 mmHg  Pulse 84  Temp(Src) 98.2 F (36.8 C) (Oral)  Resp 18  SpO2 98%  LMP 05/29/2015 (Exact Date)           Marella Chimes, Hershal Coria 07/13/15 2150  Wandra Arthurs, MD 07/14/15 1131

## 2015-07-17 ENCOUNTER — Ambulatory Visit: Payer: Medicaid Other | Admitting: Certified Nurse Midwife

## 2015-07-31 ENCOUNTER — Other Ambulatory Visit: Payer: Self-pay | Admitting: *Deleted

## 2015-07-31 ENCOUNTER — Ambulatory Visit (INDEPENDENT_AMBULATORY_CARE_PROVIDER_SITE_OTHER): Payer: Medicaid Other | Admitting: Certified Nurse Midwife

## 2015-07-31 ENCOUNTER — Encounter: Payer: Self-pay | Admitting: Certified Nurse Midwife

## 2015-07-31 VITALS — BP 118/78 | HR 87 | Temp 97.4°F | Ht 64.0 in | Wt 180.0 lb

## 2015-07-31 DIAGNOSIS — Z30432 Encounter for removal of intrauterine contraceptive device: Secondary | ICD-10-CM

## 2015-07-31 DIAGNOSIS — Z113 Encounter for screening for infections with a predominantly sexual mode of transmission: Secondary | ICD-10-CM

## 2015-07-31 DIAGNOSIS — Z0000 Encounter for general adult medical examination without abnormal findings: Secondary | ICD-10-CM

## 2015-07-31 DIAGNOSIS — Z01419 Encounter for gynecological examination (general) (routine) without abnormal findings: Secondary | ICD-10-CM

## 2015-07-31 DIAGNOSIS — B9689 Other specified bacterial agents as the cause of diseases classified elsewhere: Secondary | ICD-10-CM

## 2015-07-31 DIAGNOSIS — E669 Obesity, unspecified: Secondary | ICD-10-CM

## 2015-07-31 DIAGNOSIS — R102 Pelvic and perineal pain: Secondary | ICD-10-CM

## 2015-07-31 DIAGNOSIS — N939 Abnormal uterine and vaginal bleeding, unspecified: Secondary | ICD-10-CM

## 2015-07-31 DIAGNOSIS — Z3169 Encounter for other general counseling and advice on procreation: Secondary | ICD-10-CM

## 2015-07-31 DIAGNOSIS — N760 Acute vaginitis: Secondary | ICD-10-CM

## 2015-07-31 LAB — COMPREHENSIVE METABOLIC PANEL
ALT: 12 U/L (ref 6–29)
AST: 12 U/L (ref 10–30)
Albumin: 4.1 g/dL (ref 3.6–5.1)
Alkaline Phosphatase: 60 U/L (ref 33–115)
BUN: 10 mg/dL (ref 7–25)
CO2: 25 mmol/L (ref 20–31)
Calcium: 9.1 mg/dL (ref 8.6–10.2)
Chloride: 105 mmol/L (ref 98–110)
Creat: 0.61 mg/dL (ref 0.50–1.10)
Glucose, Bld: 71 mg/dL (ref 65–99)
Potassium: 4.2 mmol/L (ref 3.5–5.3)
Sodium: 141 mmol/L (ref 135–146)
Total Bilirubin: 0.8 mg/dL (ref 0.2–1.2)
Total Protein: 6.5 g/dL (ref 6.1–8.1)

## 2015-07-31 LAB — CBC WITH DIFFERENTIAL/PLATELET
Basophils Absolute: 0 10*3/uL (ref 0.0–0.1)
Basophils Relative: 0 % (ref 0–1)
Eosinophils Absolute: 0.4 10*3/uL (ref 0.0–0.7)
Eosinophils Relative: 4 % (ref 0–5)
HCT: 43.6 % (ref 36.0–46.0)
Hemoglobin: 14.6 g/dL (ref 12.0–15.0)
Lymphocytes Relative: 18 % (ref 12–46)
Lymphs Abs: 1.9 10*3/uL (ref 0.7–4.0)
MCH: 29.3 pg (ref 26.0–34.0)
MCHC: 33.5 g/dL (ref 30.0–36.0)
MCV: 87.4 fL (ref 78.0–100.0)
MPV: 9.6 fL (ref 8.6–12.4)
Monocytes Absolute: 0.4 10*3/uL (ref 0.1–1.0)
Monocytes Relative: 4 % (ref 3–12)
Neutro Abs: 7.7 10*3/uL (ref 1.7–7.7)
Neutrophils Relative %: 74 % (ref 43–77)
Platelets: 344 10*3/uL (ref 150–400)
RBC: 4.99 MIL/uL (ref 3.87–5.11)
RDW: 14 % (ref 11.5–15.5)
WBC: 10.4 10*3/uL (ref 4.0–10.5)

## 2015-07-31 LAB — TRIGLYCERIDES: Triglycerides: 103 mg/dL (ref <150)

## 2015-07-31 LAB — TSH: TSH: 0.491 u[IU]/mL (ref 0.350–4.500)

## 2015-07-31 LAB — CHOLESTEROL, TOTAL: Cholesterol: 134 mg/dL (ref 125–200)

## 2015-07-31 LAB — HDL CHOLESTEROL: HDL: 31 mg/dL — ABNORMAL LOW (ref >=46)

## 2015-07-31 MED ORDER — METRONIDAZOLE 500 MG PO TABS: 500.0000 mg | ORAL_TABLET | Freq: Two times a day (BID) | ORAL | Status: DC

## 2015-07-31 MED ORDER — FLUCONAZOLE 100 MG PO TABS: 100.0000 mg | ORAL_TABLET | Freq: Once | ORAL | Status: DC

## 2015-07-31 MED ORDER — CYCLOBENZAPRINE HCL 10 MG PO TABS: 10.0000 mg | ORAL_TABLET | Freq: Three times a day (TID) | ORAL | Status: DC | PRN

## 2015-07-31 MED ORDER — METRONIDAZOLE 0.75 % VA GEL: 1.0000 | Freq: Two times a day (BID) | VAGINAL | Status: DC

## 2015-07-31 MED ORDER — VARENICLINE TARTRATE 0.5 MG X 11 & 1 MG X 42 PO MISC: ORAL | Status: DC

## 2015-07-31 MED ORDER — METROGEL-VAGINAL 0.75 % VA GEL: 1.0000 | Freq: Two times a day (BID) | VAGINAL | Status: DC

## 2015-07-31 MED ORDER — TRANEXAMIC ACID 650 MG PO TABS: 1300.0000 mg | ORAL_TABLET | Freq: Three times a day (TID) | ORAL | Status: DC

## 2015-07-31 MED ORDER — VARENICLINE TARTRATE 1 MG PO TABS: 1.0000 mg | ORAL_TABLET | Freq: Two times a day (BID) | ORAL | Status: DC

## 2015-07-31 NOTE — Progress Notes (Signed)
Patient ID: Penny [name], female   DOB: 06-23-1981, 34 y.o.   MRN: [id number]    Subjective:        Penny [name] is a 33 y.o. female here for a routine exam.  Current complaints: desires pregnancy.  Desires tubal reversal not sure when that will occur.  Had a tubal ligation and now has heavy bleeding with cramping.  Currently has Mirena IUD, irregular bleeding with spotting.  Prior to Mirena had menorrhagia for 5-7 days, denies any clots, occasional cramping.  Desires to have Mirena IUD removed.  Had Mirena placed in 2014.  Spouse present for exam.      Personal health questionnaire:  Is patient Ashkenazi Jewish, have a family history of breast and/or ovarian cancer: no Is there a family history of uterine cancer diagnosed at age < 68, gastrointestinal cancer, urinary tract cancer, family member who is a Field seismologist syndrome-associated carrier: no Is the patient overweight and hypertensive, family history of diabetes, personal history of gestational diabetes, preeclampsia or PCOS: yes Is patient over 66, have PCOS,  family history of premature CHD under age 54, diabetes, smoke, have hypertension or peripheral artery disease:  Yes, DM &HTN; PA: CVA.   At any time, has a partner hit, kicked or otherwise hurt or frightened you?: no Over the past 2 weeks, have you felt down, depressed or hopeless?: no Over the past 2 weeks, have you felt little interest or pleasure in doing things?:no   Gynecologic History Patient's last menstrual period was 07/05/2015. Contraception: tubal ligation Last Pap: unknown. Results were: normal according to the patient Last mammogram: N/A.   Obstetric History OB History  Gravida Para Term Preterm AB SAB TAB Ectopic Multiple Living  3 3 1 2  0 0 0 0 0 3    # Outcome Date GA Lbr Len/2nd Weight Sex Delivery Anes PTL Lv  3 Preterm 02/13/12 [redacted]w[redacted]d  5 lb 14.1 oz (2.668 kg) F CS-LTranv   Y  2 Preterm 09/2009 [redacted]w[redacted]d  5 lb 10 oz (2.551 kg) F CS-LTranv   Y     Comments:  36wks, placenta previa with hemorrhage  1 Term 07/2002 [redacted]w[redacted]d  6 lb 12 oz (3.062 kg) M Vag-Spont  N Y      Past Medical History  Diagnosis Date  . Preterm labor     c/s at 36 wks  . Placenta previa     hx and with current pregnancy  . Migraines     last one over 1 month ago  . Anxiety     no meds  . Depression     no meds  . Asthma   . ADHD (attention deficit hyperactivity disorder)   . Scoliosis     Past Surgical History  Procedure Laterality Date  . Cesarean section  2010    x 1 at 36 wks in [place]  . Svd  2003    x 1 in [place]  . Wisdom tooth extraction    . Tubal ligation       Current outpatient prescriptions:  .  cyclobenzaprine (FLEXERIL) 10 MG tablet, Take 1 tablet (10 mg total) by mouth 3 (three) times daily as needed for muscle spasms., Disp: 90 tablet, Rfl: 2 .  fluconazole (DIFLUCAN) 100 MG tablet, Take 1 tablet (100 mg total) by mouth once. Repeat dose in 48-72 hour., Disp: 3 tablet, Rfl: 0 .  metroNIDAZOLE (FLAGYL) 500 MG tablet, Take 1 tablet (500 mg total) by mouth 2 (two) times daily., Disp: 14 tablet, Rfl:  0 .  metroNIDAZOLE (METROGEL VAGINAL) 0.75 % vaginal gel, Place 1 Applicatorful vaginally 2 (two) times daily., Disp: 70 g, Rfl: 0 .  tranexamic acid (LYSTEDA) 650 MG TABS tablet, Take 2 tablets (1,300 mg total) by mouth 3 (three) times daily., Disp: 30 tablet, Rfl: 4 .  varenicline (CHANTIX CONTINUING MONTH PAK) 1 MG tablet, Take 1 tablet (1 mg total) by mouth 2 (two) times daily., Disp: 60 tablet, Rfl: 5 .  varenicline (CHANTIX STARTING MONTH PAK) 0.5 MG X 11 & 1 MG X 42 tablet, Take a 0.5mg  tablet 1X/day for 3 days, increase to twice a day for 4 days, then take 1 mg tablet twice a day., Disp: 53 tablet, Rfl: 0 .  [DISCONTINUED] Cetirizine HCl 10 MG CAPS, Take 1 capsule (10 mg total) by mouth at bedtime., Disp: 30 capsule, Rfl: 0 .  [DISCONTINUED] fluticasone (FLONASE) 50 MCG/ACT nasal spray, Place 2 sprays into both nostrils daily. (Patient not taking:  Reported on 03/08/2015), Disp: 16 g, Rfl: 0 .  [DISCONTINUED] gabapentin (NEURONTIN) 300 MG capsule, Take 1 capsule (300 mg total) by mouth 3 (three) times daily. (Patient not taking: Reported on 11/24/2014), Disp: 30 capsule, Rfl: 1 .  [DISCONTINUED] ipratropium (ATROVENT) 0.06 % nasal spray, Place 2 sprays into both nostrils 4 (four) times daily. For nasal congestion (Patient not taking: Reported on 03/08/2015), Disp: 15 mL, Rfl: 0 .  [DISCONTINUED] omeprazole (PRILOSEC) 20 MG capsule, Take 1 capsule (20 mg total) by mouth daily. (Patient not taking: Reported on 11/28/2014), Disp: 30 capsule, Rfl: 0 No Known Allergies  Social History  Substance Use Topics  . Smoking status: Current Every Day Smoker -- 0.25 packs/day for 12 years    Types: Cigarettes  . Smokeless tobacco: Never Used  . Alcohol Use: No     Comment: on occ    Family History  Problem Relation Age of Onset  . Cancer Mother   . Hypertension Sister   . Sickle cell trait Daughter   . Asthma Son   . Asthma Paternal Grandmother   . Pseudochol deficiency Neg Hx   . Malignant hyperthermia Neg Hx   . Anesthesia problems Neg Hx   . Hypotension Neg Hx       Review of Systems  Constitutional: negative for fatigue and weight loss Respiratory: negative for cough and wheezing Cardiovascular: negative for chest pain, fatigue and palpitations Gastrointestinal: negative for abdominal pain and change in bowel habits Musculoskeletal:negative for myalgias Neurological: negative for gait problems and tremors Behavioral/Psych: negative for abusive relationship, depression Endocrine: negative for temperature intolerance   Genitourinary:negative for abnormal menstrual periods, genital lesions, hot flashes, sexual problems and vaginal discharge Integument/breast: negative for breast lump, breast tenderness, nipple discharge and skin lesion(s)    Objective:       BP 118/78 mmHg  Pulse 87  Temp(Src) 97.4 F (36.3 C)  Ht 5\' 4"  (1.626 m)   Wt 180 lb (81.647 kg)  BMI 30.88 kg/m2  LMP 07/05/2015 General:   alert  Skin:   no rash or abnormalities  Lungs:   clear to auscultation bilaterally  Heart:   regular rate and rhythm, S1, S2 normal, no murmur, click, rub or gallop  Breasts:   normal without suspicious masses, skin or nipple changes or axillary nodes  Abdomen:  normal findings: no organomegaly, soft, non-tender and no hernia  Pelvis:  External genitalia: normal general appearance Urinary system: urethral meatus normal and bladder without fullness, nontender Vaginal: normal without tenderness, induration or masses Cervix: normal  appearance, strings present prior to removal of IUD Adnexa: normal bimanual exam Uterus: anteverted and non-tender, normal size  Procedure note: Speculum placed: strings visualized.  After obtaining pap smear/sure swab.  Used ring forcepts grabbed strings and Mirena IUD removed intact with ease.  Patient tolerated the procedure well.    Lab Review Urine pregnancy test Labs reviewed yes Radiologic studies reviewed yes  50% of 30 min visit spent on counseling and coordination of care.   Assessment:    Healthy female exam.   Mirena IUD removal  Smoking cessation counseling  Preconception counseling  BV  Screening exam for STDs  Plan:    Education reviewed: calcium supplements, depression evaluation, low fat, low cholesterol diet, safe sex/STD prevention, self breast exams, skin cancer screening, smoking cessation and weight bearing exercise. Contraception: tubal ligation. Follow up in: after tubal reversal or in 1 year which ever comes first or PRN.   Meds ordered this encounter  Medications  . tranexamic acid (LYSTEDA) 650 MG TABS tablet    Sig: Take 2 tablets (1,300 mg total) by mouth 3 (three) times daily.    Dispense:  30 tablet    Refill:  4  . cyclobenzaprine (FLEXERIL) 10 MG tablet    Sig: Take 1 tablet (10 mg total) by mouth 3 (three) times daily as needed for muscle  spasms.    Dispense:  90 tablet    Refill:  2  . metroNIDAZOLE (METROGEL VAGINAL) 0.75 % vaginal gel    Sig: Place 1 Applicatorful vaginally 2 (two) times daily.    Dispense:  70 g    Refill:  0  . metroNIDAZOLE (FLAGYL) 500 MG tablet    Sig: Take 1 tablet (500 mg total) by mouth 2 (two) times daily.    Dispense:  14 tablet    Refill:  0  . fluconazole (DIFLUCAN) 100 MG tablet    Sig: Take 1 tablet (100 mg total) by mouth once. Repeat dose in 48-72 hour.    Dispense:  3 tablet    Refill:  0  . varenicline (CHANTIX STARTING MONTH PAK) 0.5 MG X 11 & 1 MG X 42 tablet    Sig: Take a 0.5mg  tablet 1X/day for 3 days, increase to twice a day for 4 days, then take 1 mg tablet twice a day.    Dispense:  53 tablet    Refill:  0  . varenicline (CHANTIX CONTINUING MONTH PAK) 1 MG tablet    Sig: Take 1 tablet (1 mg total) by mouth 2 (two) times daily.    Dispense:  60 tablet    Refill:  5   Orders Placed This Encounter  Procedures  . SureSwab, Vaginosis/Vaginitis Plus  . HIV antibody (with reflex)  . Hepatitis B surface antigen  . RPR  . Hepatitis C antibody  . TSH  . Prolactin  . Testosterone, Free, Total, SHBG  . 17-Hydroxyprogesterone  . Progesterone  . Comprehensive metabolic panel  . CBC with Differential/Platelet  . Cholesterol, total  . Triglycerides  . HDL cholesterol  . Referral to Nutrition and Diabetes Services    Referral Priority:  Routine    Referral Type:  Consultation    Referral Reason:  Specialty Services Required    Number of Visits Requested:  1

## 2015-08-01 LAB — HEPATITIS B SURFACE ANTIGEN: Hepatitis B Surface Ag: NEGATIVE

## 2015-08-01 LAB — PROLACTIN: Prolactin: 4.6 ng/mL

## 2015-08-01 LAB — HIV ANTIBODY (ROUTINE TESTING W REFLEX): HIV 1&2 Ab, 4th Generation: NONREACTIVE

## 2015-08-01 LAB — PROGESTERONE: Progesterone: 0.7 ng/mL

## 2015-08-01 LAB — HEPATITIS C ANTIBODY: HCV Ab: NEGATIVE

## 2015-08-01 LAB — RPR

## 2015-08-03 LAB — TESTOSTERONE, FREE, TOTAL, SHBG
Sex Hormone Binding: 27 nmol/L (ref 17–124)
Testosterone, Free: 4.4 pg/mL (ref 0.6–6.8)
Testosterone-% Free: 2 % (ref 0.4–2.4)
Testosterone: 22 ng/dL (ref 10–70)

## 2015-08-04 ENCOUNTER — Telehealth: Payer: Self-pay | Admitting: *Deleted

## 2015-08-04 LAB — PAP, TP IMAGING W/ HPV RNA, RFLX HPV TYPE 16,18/45: HPV mRNA, High Risk: NOT DETECTED

## 2015-08-04 LAB — 17-HYDROXYPROGESTERONE: 17-OH-Progesterone, LC/MS/MS: 15 ng/dL

## 2015-08-04 NOTE — Telephone Encounter (Signed)
Pt called office asking for return call, ? About medication.  Return call to pt.  LM on VM to call office.

## 2015-08-05 ENCOUNTER — Other Ambulatory Visit: Payer: Self-pay | Admitting: Certified Nurse Midwife

## 2015-08-05 LAB — SURESWAB, VAGINOSIS/VAGINITIS PLUS
Atopobium vaginae: 6.7 Log (cells/mL)
BV CATEGORY: UNDETERMINED — AB
C. albicans, DNA: NOT DETECTED
C. glabrata, DNA: NOT DETECTED
C. parapsilosis, DNA: NOT DETECTED
C. trachomatis RNA, TMA: NOT DETECTED
C. tropicalis, DNA: NOT DETECTED
Gardnerella vaginalis: >8 Log (cells/mL)
LACTOBACILLUS SPECIES: 5 Log (cells/mL)
MEGASPHAERA SPECIES: NOT DETECTED Log (cells/mL)
N. gonorrhoeae RNA, TMA: NOT DETECTED
T. vaginalis RNA, QL TMA: NOT DETECTED

## 2015-08-07 ENCOUNTER — Telehealth: Payer: Self-pay | Admitting: *Deleted

## 2015-08-07 NOTE — Telephone Encounter (Signed)
Wellbutrin would be the other medication, not as effective as Chantix however.  Please tell her that Chantix was originally developed for MDD, but went for smoking cessation approval.  Chantix is not a long term medication, she can try it and if having those thoughts stop the medication.  The suggestions that you gave her are great options.  She can try the [facility] Quit line number also.

## 2015-08-07 NOTE — Telephone Encounter (Signed)
Pt was called to discuss lab results.  Pt was counseled on medication as she had questions regarding Rx's. Pt also has some concerns with Chantix Rx.  Pt states that she was reading through information and is concerned with side effects. Pt states that she has dealt with depression and suicidal thoughts in the past and is worried about taking medication.  Pt ask if there are any other medication that she may take for smoking cessation.  Pt was informed about patches and gum.  Pt also advised that message would be sent to [facility] for further recommendations and/or Rx change.  Please advise on use of Chantix and/or change of Rx.

## 2015-08-13 NOTE — Telephone Encounter (Signed)
Thank you Vinnie Level.  She had discussed the BTL reversal with Dr. Jodi Mourning.  She was told to call Dr. Darreld Mclean for a consultation.  I do not know that we need to do a formal referral since Medicaid will not cover it.  Thank you for speaking with her about the Chantix.

## 2015-08-13 NOTE — Telephone Encounter (Signed)
Spoke with pt regarding change in Rx for smoking.  Pt states that she will keep the Chantix and try that as she feels needed.  Reviewed information about Wellbutrin with pt.  She states that she does not wish to change to that medication at this time.  Pt informed that if she takes Chantix and has any severe/depressed/suicidal side effects that she is to stop medication. Pt states understanding.  Pt is inquiring about pain/problem that she has been having.  Pt states that she has been taking the Tramadol and Ibuprofen with little relief.  Pt states that it is thought that the clamp used from her tubal is what is causing the pain.  Pt states that she is interested in tubal reversal and would like to know who she needs to see. Pt would also like to know what she should do in the meantime for pain/how to get an appt elsewhere for procedure.  Please review and advise.

## 2015-08-16 ENCOUNTER — Encounter (HOSPITAL_COMMUNITY): Payer: Self-pay | Admitting: Emergency Medicine

## 2015-08-16 ENCOUNTER — Emergency Department (HOSPITAL_COMMUNITY)
Admission: EM | Admit: 2015-08-16 | Discharge: 2015-08-16 | Disposition: A | Discharge status: Home / Self Care | Payer: Medicaid Other | Attending: Emergency Medicine | Admitting: Emergency Medicine

## 2015-08-16 DIAGNOSIS — R002 Palpitations: Secondary | ICD-10-CM | POA: Insufficient documentation

## 2015-08-16 DIAGNOSIS — J45909 Unspecified asthma, uncomplicated: Secondary | ICD-10-CM | POA: Insufficient documentation

## 2015-08-16 DIAGNOSIS — Z8679 Personal history of other diseases of the circulatory system: Secondary | ICD-10-CM | POA: Diagnosis not present

## 2015-08-16 DIAGNOSIS — R000 Tachycardia, unspecified: Secondary | ICD-10-CM | POA: Diagnosis present

## 2015-08-16 DIAGNOSIS — Z79899 Other long term (current) drug therapy: Secondary | ICD-10-CM | POA: Diagnosis not present

## 2015-08-16 DIAGNOSIS — F329 Major depressive disorder, single episode, unspecified: Secondary | ICD-10-CM | POA: Insufficient documentation

## 2015-08-16 DIAGNOSIS — F419 Anxiety disorder, unspecified: Secondary | ICD-10-CM | POA: Insufficient documentation

## 2015-08-16 DIAGNOSIS — Z3202 Encounter for pregnancy test, result negative: Secondary | ICD-10-CM | POA: Insufficient documentation

## 2015-08-16 DIAGNOSIS — Z8739 Personal history of other diseases of the musculoskeletal system and connective tissue: Secondary | ICD-10-CM | POA: Insufficient documentation

## 2015-08-16 DIAGNOSIS — Z720 Tobacco use: Secondary | ICD-10-CM | POA: Diagnosis not present

## 2015-08-16 LAB — CBC WITH DIFFERENTIAL/PLATELET
Basophils Absolute: 0.1 10*3/uL (ref 0.0–0.1)
Basophils Relative: 0 %
Eosinophils Absolute: 0.4 10*3/uL (ref 0.0–0.7)
Eosinophils Relative: 3 %
HCT: 44.5 % (ref 36.0–46.0)
Hemoglobin: 15 g/dL (ref 12.0–15.0)
Lymphocytes Relative: 24 %
Lymphs Abs: 3.1 10*3/uL (ref 0.7–4.0)
MCH: 29.4 pg (ref 26.0–34.0)
MCHC: 33.7 g/dL (ref 30.0–36.0)
MCV: 87.3 fL (ref 78.0–100.0)
Monocytes Absolute: 0.7 10*3/uL (ref 0.1–1.0)
Monocytes Relative: 5 %
Neutro Abs: 8.6 10*3/uL — ABNORMAL HIGH (ref 1.7–7.7)
Neutrophils Relative %: 68 %
Platelets: 290 10*3/uL (ref 150–400)
RBC: 5.1 MIL/uL (ref 3.87–5.11)
RDW: 13.6 % (ref 11.5–15.5)
WBC: 12.8 10*3/uL — ABNORMAL HIGH (ref 4.0–10.5)

## 2015-08-16 LAB — URINALYSIS, ROUTINE W REFLEX MICROSCOPIC
Bilirubin Urine: NEGATIVE
Glucose, UA: NEGATIVE mg/dL
Ketones, ur: NEGATIVE mg/dL
Leukocytes, UA: NEGATIVE
Nitrite: NEGATIVE
Protein, ur: NEGATIVE mg/dL
Specific Gravity, Urine: 1.026 (ref 1.005–1.030)
Urobilinogen, UA: 1 mg/dL (ref 0.0–1.0)
pH: 6 (ref 5.0–8.0)

## 2015-08-16 LAB — BASIC METABOLIC PANEL
Anion gap: 9 (ref 5–15)
BUN: 9 mg/dL (ref 6–20)
CO2: 23 mmol/L (ref 22–32)
Calcium: 8.6 mg/dL — ABNORMAL LOW (ref 8.9–10.3)
Chloride: 106 mmol/L (ref 101–111)
Creatinine, Ser: 0.57 mg/dL (ref 0.44–1.00)
GFR calc Af Amer: >60 mL/min (ref >60)
GFR calc non Af Amer: >60 mL/min (ref >60)
Glucose, Bld: 132 mg/dL — ABNORMAL HIGH (ref 65–99)
Potassium: 3.7 mmol/L (ref 3.5–5.1)
Sodium: 138 mmol/L (ref 135–145)

## 2015-08-16 LAB — POC URINE PREG, ED: Preg Test, Ur: NEGATIVE

## 2015-08-16 LAB — URINE MICROSCOPIC-ADD ON

## 2015-08-16 LAB — TROPONIN I: Troponin I: <0.03 ng/mL (ref <0.031)

## 2015-08-16 NOTE — ED Provider Notes (Signed)
CSN: [id number]     Arrival date & time 08/16/15  0057 History  By signing my name below, I, [name], attest that this documentation has been prepared under the direction and in the presence of Serita Grit, MD. Electronically Signed: Judithann Sauger, ED Scribe. 08/16/2015. 1:58 AM.    Chief Complaint  Patient presents with  . Tachycardia   Patient is a 34 y.o. female presenting with palpitations. The history is provided by the patient. No language interpreter was used.  Palpitations Palpitations quality:  Fast Onset quality:  Sudden Timing:  Intermittent Progression:  Unchanged Chronicity:  Recurrent Context: anxiety and nicotine   Context: not exercise   Relieved by:  None tried Ineffective treatments:  None tried Associated symptoms: no chest pain and no shortness of breath   Risk factors: no heart disease    HPI Comments: Penny [name] is a 34 y.o. female with a hx of anxiety and ADHD who presents to the Emergency Department complaining of intermittent sudden onset of tachycardia and palpitations onset 40 days ago. She reports associated hand shaking but is resolved after she eats something. She denies any CP, SOB or difficult breathing. She explains that she donates plasma and she has noticed that her heart rate is always elevated when she donates. She reports that her hx of anxiety and ADHD may play a role but she has these symptoms when she is resting and doing any strenuous activities. She reports that the most recent onset was tonight while she was watching television. She reports that she is a current smoker. Her LNMP was about 2 weeks ago.   Past Medical History  Diagnosis Date  . Preterm labor     c/s at 36 wks  . Placenta previa     hx and with current pregnancy  . Migraines     last one over 1 month ago  . Anxiety     no meds  . Depression     no meds  . Asthma   . ADHD (attention deficit hyperactivity disorder)   . Scoliosis    Past Surgical  History  Procedure Laterality Date  . Cesarean section  2010    x 1 at 36 wks in [place]  . Svd  2003    x 1 in [place]  . Wisdom tooth extraction    . Tubal ligation     Family History  Problem Relation Age of Onset  . Cancer Mother   . Hypertension Sister   . Sickle cell trait Daughter   . Asthma Son   . Asthma Paternal Grandmother   . Pseudochol deficiency Neg Hx   . Malignant hyperthermia Neg Hx   . Anesthesia problems Neg Hx   . Hypotension Neg Hx    Social History  Substance Use Topics  . Smoking status: Current Every Day Smoker -- 0.25 packs/day for 12 years    Types: Cigarettes  . Smokeless tobacco: Never Used  . Alcohol Use: No     Comment: on occ   OB History    Gravida Para Term Preterm AB TAB SAB Ectopic Multiple Living   3 3 1 2  0 0 0 0 0 3     Review of Systems  Respiratory: Negative for shortness of breath.   Cardiovascular: Positive for palpitations. Negative for chest pain.  All other systems reviewed and are negative.     Allergies  Review of patient's allergies indicates no known allergies.  Home Medications   Prior to Admission  medications   Medication Sig Start Date End Date Taking? Authorizing Provider  cyclobenzaprine (FLEXERIL) 10 MG tablet Take 1 tablet (10 mg total) by mouth 3 (three) times daily as needed for muscle spasms. 07/31/15   Rachelle A Denney, CNM  fluconazole (DIFLUCAN) 100 MG tablet Take 1 tablet (100 mg total) by mouth once. Repeat dose in 48-72 hour. 07/31/15   Rachelle A Denney, CNM  METROGEL VAGINAL 0.75 % vaginal gel Place 1 Applicatorful vaginally 2 (two) times daily. 07/31/15   Rachelle A Denney, CNM  metroNIDAZOLE (FLAGYL) 500 MG tablet Take 1 tablet (500 mg total) by mouth 2 (two) times daily. 07/31/15   Rachelle A Denney, CNM  tranexamic acid (LYSTEDA) 650 MG TABS tablet Take 2 tablets (1,300 mg total) by mouth 3 (three) times daily. 07/31/15   Rachelle A Margurite Auerbach, CNM  varenicline (CHANTIX CONTINUING MONTH PAK) 1 MG tablet  Take 1 tablet (1 mg total) by mouth 2 (two) times daily. 07/31/15   Rachelle A Denney, CNM  varenicline (CHANTIX STARTING MONTH PAK) 0.5 MG X 11 & 1 MG X 42 tablet Take a 0.5mg  tablet 1X/day for 3 days, increase to twice a day for 4 days, then take 1 mg tablet twice a day. 07/31/15   Rachelle A Denney, CNM   BP 108/77 mmHg  Pulse 96  Temp(Src) 98.6 F (37 C) (Oral)  Resp 17  Ht 5\' 5"  (1.651 m)  Wt 180 lb (81.647 kg)  BMI 29.95 kg/m2  SpO2 99%  LMP 07/05/2015 Physical Exam  Constitutional: She is oriented to person, place, and time. She appears well-developed and well-nourished. No distress.  HENT:  Head: Normocephalic and atraumatic.  Mouth/Throat: Oropharynx is clear and moist.  Eyes: Conjunctivae are normal. Pupils are equal, round, and reactive to light. No scleral icterus.  Neck: Neck supple.  Cardiovascular: Normal rate, regular rhythm, normal heart sounds and intact distal pulses.   No murmur heard. Pulmonary/Chest: Effort normal and breath sounds normal. No stridor. No respiratory distress. She has no rales.  Abdominal: Soft. Bowel sounds are normal. She exhibits no distension. There is no tenderness.  Musculoskeletal: Normal range of motion.  Neurological: She is alert and oriented to person, place, and time.  Skin: Skin is warm and dry. No rash noted.  Psychiatric: She has a normal mood and affect. Her behavior is normal.  Nursing note and vitals reviewed.   ED Course  Procedures (including critical care time) DIAGNOSTIC STUDIES: Oxygen Saturation is 95% on RA, adequate by my interpretation.    COORDINATION OF CARE: 1:20 AM- Pt advised of plan for treatment and pt agrees.   Labs Review Labs Reviewed  CBC WITH DIFFERENTIAL/PLATELET - Abnormal; Notable for the following:    WBC 12.8 (*)    Neutro Abs 8.6 (*)    All other components within normal limits  BASIC METABOLIC PANEL - Abnormal; Notable for the following:    Glucose, Bld 132 (*)    Calcium 8.6 (*)    All  other components within normal limits  URINALYSIS, ROUTINE W REFLEX MICROSCOPIC (NOT AT [facility]) - Abnormal; Notable for the following:    APPearance CLOUDY (*)    Hgb urine dipstick SMALL (*)    All other components within normal limits  URINE MICROSCOPIC-ADD ON - Abnormal; Notable for the following:    Squamous Epithelial / LPF MANY (*)    All other components within normal limits  TROPONIN I  POC URINE PREG, ED    Imaging Review No results found.  Serita Grit, MD has personally reviewed and evaluated these images and lab results as part of his medical decision-making.   EKG Interpretation   Date/Time:  Sunday August 16 2015 01:08:20 EDT Ventricular Rate:  99 PR Interval:  158 QRS Duration: 88 QT Interval:  353 QTC Calculation: 453 R Axis:   75 Text Interpretation:  Sinus rhythm ST elevation, consider inferior injury  No significant change was found Confirmed by [facility]  MD, TREY ([id number]) on  08/16/2015 1:48:18 AM      MDM   Final diagnoses:  Palpitations    34 yo female presenting with palpitations, described as fast heart beat.  No skipped or missed beats.  No syncope.  Well appearing, HR improved to 80's during ED stay.  Labs and EKG unremarkable.  Advised she follow up with PCP for further evaluation.    I personally performed the services described in this documentation, which was scribed in my presence. The recorded information has been reviewed and is accurate.     Serita Grit, MD 08/16/15 [phone]

## 2015-08-16 NOTE — ED Notes (Signed)
Brought by EMS for racing heart.  Reports was watching tv and felt heart racing.  Hx of anxiety and depression and ADHD.  Not currently on any meds.  HR 110 on arrival of EMS.

## 2015-08-16 NOTE — Discharge Instructions (Signed)

## 2015-08-18 ENCOUNTER — Encounter (HOSPITAL_COMMUNITY): Payer: Self-pay

## 2015-08-18 ENCOUNTER — Telehealth: Payer: Self-pay | Admitting: *Deleted

## 2015-08-18 ENCOUNTER — Inpatient Hospital Stay (HOSPITAL_COMMUNITY)
Admission: AD | Admit: 2015-08-18 | Discharge: 2015-08-18 | Disposition: A | Discharge status: Home / Self Care | Payer: Medicaid Other | Source: Ambulatory Visit | Attending: Obstetrics | Admitting: Obstetrics

## 2015-08-18 DIAGNOSIS — R102 Pelvic and perineal pain: Secondary | ICD-10-CM | POA: Diagnosis not present

## 2015-08-18 DIAGNOSIS — R110 Nausea: Secondary | ICD-10-CM | POA: Diagnosis not present

## 2015-08-18 DIAGNOSIS — R109 Unspecified abdominal pain: Secondary | ICD-10-CM | POA: Diagnosis present

## 2015-08-18 DIAGNOSIS — R1031 Right lower quadrant pain: Secondary | ICD-10-CM

## 2015-08-18 LAB — URINALYSIS, ROUTINE W REFLEX MICROSCOPIC
Bilirubin Urine: NEGATIVE
Glucose, UA: NEGATIVE mg/dL
Ketones, ur: NEGATIVE mg/dL
Leukocytes, UA: NEGATIVE
Nitrite: NEGATIVE
Protein, ur: NEGATIVE mg/dL
Specific Gravity, Urine: 1.015 (ref 1.005–1.030)
Urobilinogen, UA: 2 mg/dL — ABNORMAL HIGH (ref 0.0–1.0)
pH: 6 (ref 5.0–8.0)

## 2015-08-18 LAB — URINE MICROSCOPIC-ADD ON

## 2015-08-18 LAB — POCT PREGNANCY, URINE: Preg Test, Ur: NEGATIVE

## 2015-08-18 MED ORDER — KETOROLAC TROMETHAMINE 60 MG/2ML IM SOLN
60.0000 mg | Freq: Once | INTRAMUSCULAR | Status: AC
  Administered 2015-08-18: 60 mg via INTRAMUSCULAR
  Filled 2015-08-18: qty 2

## 2015-08-18 MED ORDER — PROMETHAZINE HCL 25 MG PO TABS: 25.0000 mg | ORAL_TABLET | Freq: Four times a day (QID) | ORAL | Status: DC | PRN

## 2015-08-18 MED ORDER — OXYCODONE-ACETAMINOPHEN 5-325 MG PO TABS
1.0000 | ORAL_TABLET | Freq: Once | ORAL | Status: AC
  Administered 2015-08-18: 1 via ORAL
  Filled 2015-08-18: qty 1

## 2015-08-18 MED ORDER — ONDANSETRON 8 MG PO TBDP: 8.0000 mg | ORAL_TABLET | Freq: Once | ORAL | Status: DC

## 2015-08-18 MED ORDER — PROMETHAZINE HCL 25 MG PO TABS
25.0000 mg | ORAL_TABLET | Freq: Once | ORAL | Status: AC
  Administered 2015-08-18: 25 mg via ORAL
  Filled 2015-08-18: qty 1

## 2015-08-18 NOTE — MAU Provider Note (Signed)
History     CSN: [id number]  Arrival date and time: 08/18/15 1746   First Provider Initiated Contact with Patient 08/18/15 1919      Chief Complaint  Patient presents with  . Abdominal Pain   HPI  Pt is not pregnant and presents with lower right sided pain.  Pt has hx of BTL with clips and right clip is known to be displaced.  Pt's pain has gotten worse over the past 3 days associated with N&V. Pt has seen Dr. Jacelyn Grip office last month and discussed with Dr. Jodi Mourning having tubal reversal. l RLQ pain since the end of July, states it went away for "a little bit" but is now back & is worse. Had spotting yesterday, none today. Denies dysuria. Has some N&V, vomited once today, no diarrhea   Past Medical History  Diagnosis Date  . Preterm labor     c/s at 36 wks  . Placenta previa     hx and with current pregnancy  . Migraines     last one over 1 month ago  . Anxiety     no meds  . Depression     no meds  . Asthma   . ADHD (attention deficit hyperactivity disorder)   . Scoliosis     Past Surgical History  Procedure Laterality Date  . Cesarean section  2010    x 1 at 36 wks in [place]  . Svd  2003    x 1 in [place]  . Wisdom tooth extraction    . Tubal ligation      Family History  Problem Relation Age of Onset  . Cancer Mother   . Hypertension Sister   . Sickle cell trait Daughter   . Asthma Son   . Asthma Paternal Grandmother   . Pseudochol deficiency Neg Hx   . Malignant hyperthermia Neg Hx   . Anesthesia problems Neg Hx   . Hypotension Neg Hx     Social History  Substance Use Topics  . Smoking status: Current Every Day Smoker -- 0.25 packs/day for 12 years    Types: Cigarettes  . Smokeless tobacco: Never Used  . Alcohol Use: No     Comment: on occ    Allergies: No Known Allergies  No prescriptions prior to admission    Review of Systems  Constitutional: Negative for fever and chills.  Gastrointestinal: Positive for nausea, vomiting and  abdominal pain. Negative for diarrhea and constipation.  Genitourinary: Negative for dysuria.   Physical Exam   Blood pressure 122/77, pulse 102, temperature 97.9 F (36.6 C), temperature source Oral, resp. rate 18, last menstrual period 08/01/2015.  Physical Exam  Nursing note and vitals reviewed. Constitutional: She is oriented to person, place, and time. She appears well-developed and well-nourished. No distress.  HENT:  Head: Normocephalic.  Eyes: Pupils are equal, round, and reactive to light.  Neck: Normal range of motion. Neck supple.  Cardiovascular: Normal rate.   Respiratory: Effort normal.  GI: Soft. She exhibits no distension. There is tenderness. There is no rebound and no guarding.  Musculoskeletal: Normal range of motion.  Neurological: She is alert and oriented to person, place, and time.  Skin: Skin is warm and dry.  Psychiatric: She has a normal mood and affect.    MAU Course  Procedures Toradol 60mg  IM and phenergan given for pain and nausea Pt to call Dr. Jodi Mourning tomorrow for further evaluation Pt could take benadryl and tylenol later today if needed Results for orders  placed or performed during the hospital encounter of 08/18/15 (from the past 24 hour(s))  Urinalysis, Routine w reflex microscopic (not at [facility])     Status: Abnormal   Collection Time: 08/18/15  6:17 PM  Result Value Ref Range   Color, Urine YELLOW YELLOW   APPearance CLEAR CLEAR   Specific Gravity, Urine 1.015 1.005 - 1.030   pH 6.0 5.0 - 8.0   Glucose, UA NEGATIVE NEGATIVE mg/dL   Hgb urine dipstick MODERATE (A) NEGATIVE   Bilirubin Urine NEGATIVE NEGATIVE   Ketones, ur NEGATIVE NEGATIVE mg/dL   Protein, ur NEGATIVE NEGATIVE mg/dL   Urobilinogen, UA 2.0 (H) 0.0 - 1.0 mg/dL   Nitrite NEGATIVE NEGATIVE   Leukocytes, UA NEGATIVE NEGATIVE  Urine microscopic-add on     Status: Abnormal   Collection Time: 08/18/15  6:17 PM  Result Value Ref Range   Squamous Epithelial / LPF FEW (A) RARE    RBC / HPF 3-6 <3 RBC/hpf   Bacteria, UA RARE RARE   Urine-Other MUCOUS PRESENT   Pregnancy, urine POC     Status: None   Collection Time: 08/18/15  6:19 PM  Result Value Ref Range   Preg Test, Ur NEGATIVE NEGATIVE   Assessment and Plan  Pelvic pain- Ibuprofen and phenergan/tylenol Nausea- Rx phenergan F/u with dr. Ferd Hibbs 08/18/2015, 7:39 PM

## 2015-08-18 NOTE — MAU Note (Signed)
Pt desires to have tubes untied. Thinks that is causing her pain.

## 2015-08-18 NOTE — Discharge Instructions (Signed)
Abdominal Pain, Women °Abdominal (stomach, pelvic, or belly) pain can be caused by many things. It is important to tell your doctor: °· The location of the pain. °· Does it come and go or is it present all the time? °· Are there things that start the pain (eating certain foods, exercise)? °· Are there other symptoms associated with the pain (fever, nausea, vomiting, diarrhea)? °All of this is helpful to know when trying to find the cause of the pain. °CAUSES  °· Stomach: virus or bacteria infection, or ulcer. °· Intestine: appendicitis (inflamed appendix), regional ileitis (Crohn's disease), ulcerative colitis (inflamed colon), irritable bowel syndrome, diverticulitis (inflamed diverticulum of the colon), or cancer of the stomach or intestine. °· Gallbladder disease or stones in the gallbladder. °· Kidney disease, kidney stones, or infection. °· Pancreas infection or cancer. °· Fibromyalgia (pain disorder). °· Diseases of the female organs: °¨ Uterus: fibroid (non-cancerous) tumors or infection. °¨ Fallopian tubes: infection or tubal pregnancy. °¨ Ovary: cysts or tumors. °¨ Pelvic adhesions (scar tissue). °¨ Endometriosis (uterus lining tissue growing in the pelvis and on the pelvic organs). °¨ Pelvic congestion syndrome (female organs filling up with blood just before the menstrual period). °¨ Pain with the menstrual period. °¨ Pain with ovulation (producing an egg). °¨ Pain with an IUD (intrauterine device, birth control) in the uterus. °¨ Cancer of the female organs. °· Functional pain (pain not caused by a disease, may improve without treatment). °· Psychological pain. °· Depression. °DIAGNOSIS  °Your doctor will decide the seriousness of your pain by doing an examination. °· Blood tests. °· X-rays. °· Ultrasound. °· CT scan (computed tomography, special type of X-ray). °· MRI (magnetic resonance imaging). °· Cultures, for infection. °· Barium enema (dye inserted in the large intestine, to better view it with  X-rays). °· Colonoscopy (looking in intestine with a lighted tube). °· Laparoscopy (minor surgery, looking in abdomen with a lighted tube). °· Major abdominal exploratory surgery (looking in abdomen with a large incision). °TREATMENT  °The treatment will depend on the cause of the pain.  °· Many cases can be observed and treated at home. °· Over-the-counter medicines recommended by your caregiver. °· Prescription medicine. °· Antibiotics, for infection. °· Birth control pills, for painful periods or for ovulation pain. °· Hormone treatment, for endometriosis. °· Nerve blocking injections. °· Physical therapy. °· Antidepressants. °· Counseling with a psychologist or psychiatrist. °· Minor or major surgery. °HOME CARE INSTRUCTIONS  °· Do not take laxatives, unless directed by your caregiver. °· Take over-the-counter pain medicine only if ordered by your caregiver. Do not take aspirin because it can cause an upset stomach or bleeding. °· Try a clear liquid diet (broth or water) as ordered by your caregiver. Slowly move to a bland diet, as tolerated, if the pain is related to the stomach or intestine. °· Have a thermometer and take your temperature several times a day, and record it. °· Bed rest and sleep, if it helps the pain. °· Avoid sexual intercourse, if it causes pain. °· Avoid stressful situations. °· Keep your follow-up appointments and tests, as your caregiver orders. °· If the pain does not go away with medicine or surgery, you may try: °¨ Acupuncture. °¨ Relaxation exercises (yoga, meditation). °¨ Group therapy. °¨ Counseling. °SEEK MEDICAL CARE IF:  °· You notice certain foods cause stomach pain. °· Your home care treatment is not helping your pain. °· You need stronger pain medicine. °· You want your IUD removed. °· You feel faint or   lightheaded. °· You develop nausea and vomiting. °· You develop a rash. °· You are having side effects or an allergy to your medicine. °SEEK IMMEDIATE MEDICAL CARE IF:  °· Your  pain does not go away or gets worse. °· You have a fever. °· Your pain is felt only in portions of the abdomen. The right side could possibly be appendicitis. The left lower portion of the abdomen could be colitis or diverticulitis. °· You are passing blood in your stools (bright red or black tarry stools, with or without vomiting). °· You have blood in your urine. °· You develop chills, with or without a fever. °· You pass out. °MAKE SURE YOU:  °· Understand these instructions. °· Will watch your condition. °· Will get help right away if you are not doing well or get worse. °Document Released: 08/28/2007 Document Revised: 03/17/2014 Document Reviewed: 09/17/2009 °Exit[employer]® Patient Information ©2015 [employer]. This information is not intended to replace advice given to you by your health care provider. Make sure you discuss any questions you have with your health care provider. ° °

## 2015-08-18 NOTE — Telephone Encounter (Signed)
Patient states the pain that she was seen for in August is back. 3:03 Call to patient- she is having pelvic pain again and thinks it is due to her previous surgery. I told patient that because her scans are good we may need to be looking for other problems and she should come in to be evaluated. Appointment given.

## 2015-08-18 NOTE — MAU Note (Signed)
RLQ pain since the end of July, states it went away for "a little bit" but is now back & is worse.  Had spotting yesterday, none today.  Denies dysuria.  Has some N&V, vomited once today, no diarrhea.

## 2015-08-19 NOTE — Telephone Encounter (Signed)
Pt has appt on 08-20-15, will review with pt and Dr Jodi Mourning

## 2015-08-20 ENCOUNTER — Ambulatory Visit: Payer: Medicaid Other | Admitting: Obstetrics

## 2015-08-24 ENCOUNTER — Encounter: Payer: Self-pay | Admitting: Obstetrics

## 2015-08-24 ENCOUNTER — Ambulatory Visit (INDEPENDENT_AMBULATORY_CARE_PROVIDER_SITE_OTHER): Payer: Medicaid Other | Admitting: Obstetrics

## 2015-08-24 VITALS — BP 137/79 | HR 95 | Temp 98.0°F | Wt 180.0 lb

## 2015-08-24 DIAGNOSIS — R102 Pelvic and perineal pain: Secondary | ICD-10-CM | POA: Diagnosis not present

## 2015-08-24 NOTE — Progress Notes (Signed)
Patient ID: Penny [name], female   DOB: 10-20-1981, 34 y.o.   MRN: [id number]  Chief Complaint  Patient presents with  . Pelvic Pain    HPI Penny [name] is a 34 y.o. female.  H/O pelvic pain and a CT showing a Filshie Clip dislodged against the bowel in the posterior cul de sac.  Ultrasound is WNL's with normal appearing uterus, ovaries and tubes. HPI  Past Medical History  Diagnosis Date  . Preterm labor     c/s at 36 wks  . Placenta previa     hx and with current pregnancy  . Migraines     last one over 1 month ago  . Anxiety     no meds  . Depression     no meds  . Asthma   . ADHD (attention deficit hyperactivity disorder)   . Scoliosis     Past Surgical History  Procedure Laterality Date  . Cesarean section  2010    x 1 at 36 wks in [place]  . Svd  2003    x 1 in [place]  . Wisdom tooth extraction    . Tubal ligation      Family History  Problem Relation Age of Onset  . Cancer Mother   . Hypertension Sister   . Sickle cell trait Daughter   . Asthma Son   . Asthma Paternal Grandmother   . Pseudochol deficiency Neg Hx   . Malignant hyperthermia Neg Hx   . Anesthesia problems Neg Hx   . Hypotension Neg Hx     Social History Social History  Substance Use Topics  . Smoking status: Current Every Day Smoker -- 0.25 packs/day for 12 years    Types: Cigarettes  . Smokeless tobacco: Never Used  . Alcohol Use: No     Comment: on occ    No Known Allergies  Current Outpatient Prescriptions  Medication Sig Dispense Refill  . promethazine (PHENERGAN) 25 MG tablet Take 1 tablet (25 mg total) by mouth every 6 (six) hours as needed for nausea or vomiting. (Patient not taking: Reported on 08/24/2015) 30 tablet 0  . [DISCONTINUED] Cetirizine HCl 10 MG CAPS Take 1 capsule (10 mg total) by mouth at bedtime. 30 capsule 0  . [DISCONTINUED] fluticasone (FLONASE) 50 MCG/ACT nasal spray Place 2 sprays into both nostrils daily. (Patient not taking: Reported on 03/08/2015)  16 g 0  . [DISCONTINUED] gabapentin (NEURONTIN) 300 MG capsule Take 1 capsule (300 mg total) by mouth 3 (three) times daily. (Patient not taking: Reported on 11/24/2014) 30 capsule 1  . [DISCONTINUED] ipratropium (ATROVENT) 0.06 % nasal spray Place 2 sprays into both nostrils 4 (four) times daily. For nasal congestion (Patient not taking: Reported on 03/08/2015) 15 mL 0  . [DISCONTINUED] omeprazole (PRILOSEC) 20 MG capsule Take 1 capsule (20 mg total) by mouth daily. (Patient not taking: Reported on 11/28/2014) 30 capsule 0   No current facility-administered medications for this visit.    Review of Systems Review of Systems Constitutional: negative for fatigue and weight loss Respiratory: negative for cough and wheezing Cardiovascular: negative for chest pain, fatigue and palpitations Gastrointestinal: negative for abdominal pain and change in bowel habits Genitourinary: pelvic pain Integument/breast: negative for nipple discharge Musculoskeletal:negative for myalgias Neurological: negative for gait problems and tremors Behavioral/Psych: negative for abusive relationship, depression Endocrine: negative for temperature intolerance     Blood pressure 137/79, pulse 95, temperature 98 F (36.7 C), weight 180 lb (81.647 kg), last menstrual period 08/01/2015.  Physical Exam  Physical Exam: Deferred  100% of 10 min visit spent on counseling and coordination of care.   Data Reviewed Ultrasound CT  Assessment     Pelvic pain.  S/P tubal sterilization with dislodged Filshie Clip.    Plan    [facility] recommended to [facility] for further diagnostic work up because of consideration for tubal reanastomosis.   No orders of the defined types were placed in this encounter.   No orders of the defined types were placed in this encounter.

## 2015-08-31 ENCOUNTER — Ambulatory Visit: Payer: Medicaid Other | Admitting: Dietician

## 2015-09-28 ENCOUNTER — Ambulatory Visit: Payer: Medicaid Other | Admitting: Dietician

## 2015-10-02 ENCOUNTER — Other Ambulatory Visit: Payer: Self-pay | Admitting: Obstetrics and Gynecology

## 2015-10-14 NOTE — Patient Instructions (Addendum)
Your procedure is scheduled on:  Monday, Dec. 12, 2016  Enter through the [employer] [facility] at:  12:15 P.M.  Pick up the phone at the desk and dial [phone].  Call this number if you have problems the morning of surgery: [phone].  Remember: Do NOT eat food: MIDNIGHT Sunday DEC. 11, 2016 Do NOT drink clear liquids after:  9:30 a.m. Day of surgery Take these medicines the morning of surgery with a SIP OF WATER:  None  Do NOT wear jewelry (body piercing), metal hair clips/bobby pins, make-up, or nail polish. Do NOT wear lotions, powders, or perfumes.  You may wear deoderant. Do NOT shave for 48 hours prior to surgery. Do NOT bring valuables to the hospital. Contacts, dentures, or bridgework may not be worn into surgery.  Have a responsible adult drive you home and stay with you for 24 hours after your procedure

## 2015-10-15 ENCOUNTER — Encounter (HOSPITAL_COMMUNITY): Payer: Self-pay

## 2015-10-15 ENCOUNTER — Encounter (HOSPITAL_COMMUNITY)
Admission: RE | Admit: 2015-10-15 | Discharge: 2015-10-15 | Disposition: A | Discharge status: Home / Self Care | Payer: Medicaid Other | Source: Ambulatory Visit | Attending: Obstetrics and Gynecology | Admitting: Obstetrics and Gynecology

## 2015-10-15 DIAGNOSIS — G8929 Other chronic pain: Secondary | ICD-10-CM | POA: Diagnosis not present

## 2015-10-15 DIAGNOSIS — Z01818 Encounter for other preprocedural examination: Secondary | ICD-10-CM | POA: Diagnosis present

## 2015-10-15 DIAGNOSIS — R102 Pelvic and perineal pain: Secondary | ICD-10-CM | POA: Diagnosis not present

## 2015-10-15 LAB — CBC
HCT: 39.8 % (ref 36.0–46.0)
Hemoglobin: 13.8 g/dL (ref 12.0–15.0)
MCH: 29.4 pg (ref 26.0–34.0)
MCHC: 34.7 g/dL (ref 30.0–36.0)
MCV: 84.7 fL (ref 78.0–100.0)
Platelets: 323 10*3/uL (ref 150–400)
RBC: 4.7 MIL/uL (ref 3.87–5.11)
RDW: 13.5 % (ref 11.5–15.5)
WBC: 10.5 10*3/uL (ref 4.0–10.5)

## 2015-10-21 ENCOUNTER — Emergency Department (HOSPITAL_COMMUNITY)
Admission: EM | Admit: 2015-10-21 | Discharge: 2015-10-21 | Disposition: A | Discharge status: Home / Self Care | Payer: Medicaid Other | Attending: Emergency Medicine | Admitting: Emergency Medicine

## 2015-10-21 ENCOUNTER — Encounter (HOSPITAL_COMMUNITY): Payer: Self-pay | Admitting: Emergency Medicine

## 2015-10-21 DIAGNOSIS — M419 Scoliosis, unspecified: Secondary | ICD-10-CM | POA: Insufficient documentation

## 2015-10-21 DIAGNOSIS — Z8659 Personal history of other mental and behavioral disorders: Secondary | ICD-10-CM | POA: Insufficient documentation

## 2015-10-21 DIAGNOSIS — R51 Headache: Secondary | ICD-10-CM | POA: Diagnosis present

## 2015-10-21 DIAGNOSIS — G43809 Other migraine, not intractable, without status migrainosus: Secondary | ICD-10-CM | POA: Insufficient documentation

## 2015-10-21 DIAGNOSIS — F17210 Nicotine dependence, cigarettes, uncomplicated: Secondary | ICD-10-CM | POA: Insufficient documentation

## 2015-10-21 DIAGNOSIS — R109 Unspecified abdominal pain: Secondary | ICD-10-CM | POA: Insufficient documentation

## 2015-10-21 MED ORDER — SODIUM CHLORIDE 0.9 % IV BOLUS (SEPSIS)
1000.0000 mL | Freq: Once | INTRAVENOUS | Status: AC
  Administered 2015-10-21: 1000 mL via INTRAVENOUS

## 2015-10-21 MED ORDER — PROCHLORPERAZINE EDISYLATE 5 MG/ML IJ SOLN
10.0000 mg | Freq: Once | INTRAMUSCULAR | Status: AC
Start: 2015-10-21 — End: 2015-10-21
  Administered 2015-10-21: 10 mg via INTRAVENOUS
  Filled 2015-10-21: qty 2

## 2015-10-21 MED ORDER — KETOROLAC TROMETHAMINE 30 MG/ML IJ SOLN
30.0000 mg | Freq: Once | INTRAMUSCULAR | Status: AC
  Administered 2015-10-21: 30 mg via INTRAVENOUS
  Filled 2015-10-21: qty 1

## 2015-10-21 MED ORDER — DIPHENHYDRAMINE HCL 50 MG/ML IJ SOLN
25.0000 mg | Freq: Once | INTRAMUSCULAR | Status: AC
  Administered 2015-10-21: 25 mg via INTRAVENOUS
  Filled 2015-10-21: qty 1

## 2015-10-21 NOTE — Discharge Instructions (Signed)

## 2015-10-21 NOTE — ED Notes (Signed)
Patient reported feeling extremely anxious, requesting to leave/go outside, and requested IV to be removed. Notified Dr. Dina Rich. MD in agreement.

## 2015-10-21 NOTE — ED Provider Notes (Signed)
CSN: [id number]     Arrival date & time 10/21/15  0127 History   By signing my name below, I, Forrestine Him, attest that this documentation has been prepared under the direction and in the presence of Merryl Hacker, MD.  Electronically Signed: Forrestine Him, ED Scribe. 10/21/2015. 1:48 AM.   Chief Complaint  Patient presents with  . Headache   The history is provided by the patient. No language interpreter was used.    HPI Comments: Penny [name] brought in by EMS is a 34 y.o. female with a PMHx of Migraines who presents to the Emergency Department complaining of constant, ongoing, sudden onset frontal HA onset just prior to arrival. Currently she rates pain 9/10. She also reports associated photophobia. No alleviating factors at this time. No OTC medications or home remedies attempted prior to arrival. No recent fever, chills, nausea, vomiting, or diarrhea.  Pt also reports ongoing, intermittent, unchanged diffuse abdominal pain x 5 months. No associated nausea or vomiting. Pt is due to have exploratory laparoscopic surgery in the near future.  PCP: Betsy Coder, MD    Past Medical History  Diagnosis Date  . Preterm labor     c/s at 36 wks  . Placenta previa     hx and with current pregnancy  . Migraines     last one over 1 month ago  . Anxiety     no meds  . Depression     no meds  . ADHD (attention deficit hyperactivity disorder)   . Scoliosis    Past Surgical History  Procedure Laterality Date  . Cesarean section  2010    x 1 at 36 wks in [place]  . Svd  2003    x 1 in [place]  . Wisdom tooth extraction    . Tubal ligation     Family History  Problem Relation Age of Onset  . Cancer Mother   . Hypertension Sister   . Sickle cell trait Daughter   . Asthma Son   . Asthma Paternal Grandmother   . Pseudochol deficiency Neg Hx   . Malignant hyperthermia Neg Hx   . Anesthesia problems Neg Hx   . Hypotension Neg Hx    Social History  Substance Use Topics  .  Smoking status: Current Every Day Smoker -- 0.10 packs/day for 16 years    Types: Cigarettes, E-cigarettes  . Smokeless tobacco: Never Used  . Alcohol Use: No     Comment: on occ   OB History    Gravida Para Term Preterm AB TAB SAB Ectopic Multiple Living   3 3 1 2  0 0 0 0 0 3     Review of Systems  Constitutional: Negative for fever and chills.  Eyes: Positive for photophobia.  Respiratory: Negative for cough and shortness of breath.   Cardiovascular: Negative for chest pain.  Gastrointestinal: Positive for abdominal pain. Negative for nausea and vomiting.  Neurological: Positive for headaches. Negative for dizziness and weakness.  Psychiatric/Behavioral: Negative for confusion.  All other systems reviewed and are negative.     Allergies  Review of patient's allergies indicates no known allergies.  Home Medications   Prior to Admission medications   Not on File   Triage Vitals: BP 131/100 mmHg  Pulse 100  Temp(Src) 98.4 F (36.9 C) (Oral)  Resp 18  SpO2 99%  LMP 09/28/2015 (Exact Date)   Physical Exam  Constitutional: She is oriented to person, place, and time. She appears well-developed and well-nourished.  No distress.  HENT:  Head: Normocephalic and atraumatic.  Eyes: Pupils are equal, round, and reactive to light.  Cardiovascular: Normal rate, regular rhythm and normal heart sounds.   No murmur heard. Pulmonary/Chest: Effort normal and breath sounds normal. No respiratory distress. She has no wheezes.  Abdominal: Soft. Bowel sounds are normal. There is no tenderness. There is no rebound.  Neurological: She is alert and oriented to person, place, and time.  Cranial nerves II through XII intact, 5 out of 5 strength in all 4 extremities  Skin: Skin is warm and dry.  Psychiatric: She has a normal mood and affect.  Nursing note and vitals reviewed.   ED Course  Procedures (including critical care time)  DIAGNOSTIC STUDIES: Oxygen Saturation is 97% on RA,  adequate by my interpretation.    COORDINATION OF CARE: 1:42 AM- Will give Toradol, Benadryl, Compazine, and fluids. Discussed treatment plan with pt at bedside and pt agreed to plan.    Labs Review Labs Reviewed - No data to display  Imaging Review No results found. I have personally reviewed and evaluated these images and lab results as part of my medical decision-making.   EKG Interpretation None      MDM   Final diagnoses:  Other migraine without status migrainosus, not intractable    Patient presents with headache consistent with prior migraines.  Denies worst headache of her life. No infectious symptoms. Neurologically intact.  Patient was given migraine cocktail. Patient also reports chronic abdominal pain without any new factors. She is scheduled for laparoscopy. No further workup at this time.  After migraine cocktail, patient reports almost complete resolution of headache. She does state that she feels anxious and shaky and like to be discharged. This is likely an affect of the Compazine.  After history, exam, and medical workup I feel the patient has been appropriately medically screened and is safe for discharge home. Pertinent diagnoses were discussed with the patient. Patient was given return precautions.  I personally performed the services described in this documentation, which was scribed in my presence. The recorded information has been reviewed and is accurate.   Merryl Hacker, MD 10/21/15 [phone]

## 2015-10-21 NOTE — ED Notes (Signed)
Patient arrived via [facility]. EMS reports that patient c/o severe headache which had began at [place]. Rates HA 9/10 on 0-10 pain scale. Denies nausea/vomiting/diarrhea. BP 132/84, Pulse 80, Resp 16, 99 % on room air.

## 2015-10-23 ENCOUNTER — Other Ambulatory Visit (HOSPITAL_COMMUNITY): Payer: Self-pay | Admitting: Obstetrics and Gynecology

## 2015-10-23 NOTE — H&P (Signed)
Penny [name] is a 34 y.o. female  P 1-2-0-3  who presents for laparoscopy because of chronic pelvic pain.  For several years the patient has had intermittent pelvic pain that was more of a nuisance than a  bother.  Within the past 6 months however,  she  began to have daily, primarily right sided, crampy/achy pelvic pain.  Her pain is made worse by certain activities, including intercourse but has not caused any changes in bowel or bladder function.  The pain is such that she requires Ibuprofen 800 mg, often with Tramadol for relief.  Her menstrual flow is monthly for 7 days and she  changes her pad 5 times a day for 3 of those days.  The remaining days only require twice a day pad change.  In August 2016 she had a CT of her abdomen and pelvis that was normal except that it was noted that her  Filshie Clip,  used for sterilization appeared displaced on the right. A pelvic ultrasound at that same time showed a uterus measuring 6.4 x 5.7 x 9.2 cm with well placed IUD and normal appearing ovaries.  The patient subsequently had her IUD removed and was placed on birth control pills however, her pelvic pain persisted.  The patient was given the medical and surgical management options for her pelvic pain however, she has decided to proceed with surgical evaluation and possible management.   Past Medical History  OB History: G: 3;  P: 1-2-0-3;  SVB 2003 and C-section 2010 and 2013  GYN History: menarche: 11    LMP: 09/28/2015     Contracepton oral contraceptives (estrogen/progesterone)  The patient reports a past history of: chlamydia and trichomonas.  Denies history of abnormal PAP smear.   Last PAP smear-ASCUS with negative HPV  Medical History: ADHD, Scoliosis, Migraines, Depression and  Anxiety   Surgical History: 2013 Tubal Sterilization with Filshie Clips Denies problems with anesthesia or history of blood transfusions  Family History: Asthma, Breast Cancer, Pancreatic Cancer, Hypertension and  Diabetes Mellitus  Social History: Married and Stay at [employer];  Denies Alcohol use but Smokes 2 Cigarettes a day (in the process of quitting)   Medication:  Camerese 0.15/30  daily Valtrex 500 mg prn as directed Ibuprofen 800 mg  with food,  every 8 hours prn Cyclobenzaprine 5 mg tid prn  No Known Allergies  But sensitive to shellfish-causes itching and lip swelling  Denies sensitivity to peanuts,  soy, latex or adhesives.   ROS: Admits to lower back pain and headache with vision changes (migraine) but  denies corrective lenses,  headache, vision changes, nasal congestion, dysphagia, tinnitus, dizziness, hoarseness, cough,  chest pain, shortness of breath, nausea, vomiting, diarrhea,constipation,  urinary frequency, urgency  dysuria, hematuria, vaginitis symptoms, pelvic pain, swelling of joints,easy bruising,  myalgias, arthralgias, skin rashes, unexplained weight loss and except as is mentioned in the history of present illness, patient's review of systems is otherwise negative.   Physical Exam  Bp:  108/62   P: 84  R: 20  Temperature: 99.1 degrees F orally    Weight: 188 lbs. Height: 5\' 3"   BMI: 33.3  Neck: supple without masses or thyromegaly Lungs: clear to auscultation Heart: regular rate and rhythm Abdomen: soft, supra-pubic -tenderness and no organomegaly Pelvic:EGBUS- wnl; vagina-normal rugae; uterus-normal size, cervix without lesions or motion tenderness; adnexae-right sided  tenderness greater than left or masses Extremities:  no clubbing, cyanosis or edema   Assesment:  Chronic Pelvic Pain   Disposition:  Reviewed with the patient the indication for her procedure along with the risks of surgery to include, but not limited to: reaction to anesthesia, damage to adjacent organs, infection and excessive bleeding. The patient verbalized understanding of these risks and has consented to proceed with Diagnostic Laparoscopy with Possible Removal of Filshie Clip   at [place] on Deceber 12, 2016.     CSN# [id number]   Nikko Goldwire J. Florene Glen, PA-C  for Dr. Franklyn Lor. Dillard

## 2015-10-26 ENCOUNTER — Encounter (HOSPITAL_COMMUNITY)
Admission: RE | Disposition: A | Discharge status: Home / Self Care | Payer: Self-pay | Source: Ambulatory Visit | Attending: Obstetrics and Gynecology

## 2015-10-26 ENCOUNTER — Ambulatory Visit (HOSPITAL_COMMUNITY): Payer: Medicaid Other | Admitting: Anesthesiology

## 2015-10-26 ENCOUNTER — Ambulatory Visit (HOSPITAL_COMMUNITY)
Admission: RE | Admit: 2015-10-26 | Discharge: 2015-10-26 | Disposition: A | Discharge status: Home / Self Care | Payer: Medicaid Other | Source: Ambulatory Visit | Attending: Obstetrics and Gynecology | Admitting: Obstetrics and Gynecology

## 2015-10-26 ENCOUNTER — Encounter (HOSPITAL_COMMUNITY): Payer: Self-pay

## 2015-10-26 DIAGNOSIS — Z91013 Allergy to seafood: Secondary | ICD-10-CM | POA: Diagnosis not present

## 2015-10-26 DIAGNOSIS — D259 Leiomyoma of uterus, unspecified: Secondary | ICD-10-CM | POA: Diagnosis not present

## 2015-10-26 DIAGNOSIS — R102 Pelvic and perineal pain: Secondary | ICD-10-CM | POA: Insufficient documentation

## 2015-10-26 DIAGNOSIS — F909 Attention-deficit hyperactivity disorder, unspecified type: Secondary | ICD-10-CM | POA: Insufficient documentation

## 2015-10-26 DIAGNOSIS — N736 Female pelvic peritoneal adhesions (postinfective): Secondary | ICD-10-CM

## 2015-10-26 DIAGNOSIS — F17200 Nicotine dependence, unspecified, uncomplicated: Secondary | ICD-10-CM | POA: Insufficient documentation

## 2015-10-26 DIAGNOSIS — Z79899 Other long term (current) drug therapy: Secondary | ICD-10-CM | POA: Diagnosis not present

## 2015-10-26 DIAGNOSIS — O26899 Other specified pregnancy related conditions, unspecified trimester: Secondary | ICD-10-CM

## 2015-10-26 DIAGNOSIS — F418 Other specified anxiety disorders: Secondary | ICD-10-CM | POA: Insufficient documentation

## 2015-10-26 DIAGNOSIS — R109 Unspecified abdominal pain: Secondary | ICD-10-CM

## 2015-10-26 HISTORY — PX: LAPAROSCOPIC LYSIS OF ADHESIONS: SHX5905

## 2015-10-26 HISTORY — PX: CHROMOPERTUBATION: SHX6288

## 2015-10-26 HISTORY — PX: LAPAROSCOPY: SHX197

## 2015-10-26 LAB — PREGNANCY, URINE: Preg Test, Ur: NEGATIVE

## 2015-10-26 SURGERY — LAPAROSCOPY OPERATIVE
Anesthesia: General

## 2015-10-26 MED ORDER — ESMOLOL HCL 100 MG/10ML IV SOLN
INTRAVENOUS | Status: AC
  Filled 2015-10-26: qty 10

## 2015-10-26 MED ORDER — PROPOFOL 10 MG/ML IV BOLUS
INTRAVENOUS | Status: DC | PRN
  Administered 2015-10-26: 50 mg via INTRAVENOUS
  Administered 2015-10-26: 150 mg via INTRAVENOUS

## 2015-10-26 MED ORDER — BUPIVACAINE-EPINEPHRINE 0.25% -1:200000 IJ SOLN
INTRAMUSCULAR | Status: DC | PRN
  Administered 2015-10-26: 11 mL

## 2015-10-26 MED ORDER — FENTANYL CITRATE (PF) 100 MCG/2ML IJ SOLN
25.0000 ug | INTRAMUSCULAR | Status: DC | PRN
  Administered 2015-10-26 (×3): 50 ug via INTRAVENOUS

## 2015-10-26 MED ORDER — MIDAZOLAM HCL 2 MG/2ML IJ SOLN
INTRAMUSCULAR | Status: AC
  Filled 2015-10-26: qty 2

## 2015-10-26 MED ORDER — LIDOCAINE HCL (CARDIAC) 20 MG/ML IV SOLN
INTRAVENOUS | Status: AC
  Filled 2015-10-26: qty 5

## 2015-10-26 MED ORDER — KETOROLAC TROMETHAMINE 30 MG/ML IJ SOLN
INTRAMUSCULAR | Status: AC
  Filled 2015-10-26: qty 1

## 2015-10-26 MED ORDER — OXYCODONE-ACETAMINOPHEN 5-325 MG PO TABS: 1.0000 | ORAL_TABLET | Freq: Four times a day (QID) | ORAL | Status: DC | PRN

## 2015-10-26 MED ORDER — NEOSTIGMINE METHYLSULFATE 10 MG/10ML IV SOLN
INTRAVENOUS | Status: DC | PRN
  Administered 2015-10-26: 2.5 mg via INTRAVENOUS

## 2015-10-26 MED ORDER — MIDAZOLAM HCL 5 MG/5ML IJ SOLN
INTRAMUSCULAR | Status: DC | PRN
  Administered 2015-10-26 (×2): 2 mg via INTRAVENOUS

## 2015-10-26 MED ORDER — FENTANYL CITRATE (PF) 250 MCG/5ML IJ SOLN
INTRAMUSCULAR | Status: AC
  Filled 2015-10-26: qty 5

## 2015-10-26 MED ORDER — DOXYCYCLINE HYCLATE 100 MG PO TABS: 100.0000 mg | ORAL_TABLET | Freq: Two times a day (BID) | ORAL | Status: DC

## 2015-10-26 MED ORDER — FENTANYL CITRATE (PF) 100 MCG/2ML IJ SOLN
INTRAMUSCULAR | Status: DC | PRN
  Administered 2015-10-26: 50 ug via INTRAVENOUS
  Administered 2015-10-26: 100 ug via INTRAVENOUS
  Administered 2015-10-26: 50 ug via INTRAVENOUS
  Administered 2015-10-26 (×2): 100 ug via INTRAVENOUS

## 2015-10-26 MED ORDER — KETOROLAC TROMETHAMINE 30 MG/ML IJ SOLN
INTRAMUSCULAR | Status: DC | PRN
  Administered 2015-10-26: 30 mg via INTRAVENOUS

## 2015-10-26 MED ORDER — FENTANYL CITRATE (PF) 100 MCG/2ML IJ SOLN
INTRAMUSCULAR | Status: AC
  Filled 2015-10-26: qty 2

## 2015-10-26 MED ORDER — LACTATED RINGERS IV SOLN
INTRAVENOUS | Status: DC
  Administered 2015-10-26: 75 mL/h via INTRAVENOUS
  Administered 2015-10-26: 16:00:00 via INTRAVENOUS
  Administered 2015-10-26: 75 mL/h via INTRAVENOUS

## 2015-10-26 MED ORDER — GLYCOPYRROLATE 0.2 MG/ML IJ SOLN
INTRAMUSCULAR | Status: DC | PRN
  Administered 2015-10-26: .5 mg via INTRAVENOUS

## 2015-10-26 MED ORDER — DEXAMETHASONE SODIUM PHOSPHATE 10 MG/ML IJ SOLN
INTRAMUSCULAR | Status: DC | PRN
  Administered 2015-10-26: 10 mg via INTRAVENOUS

## 2015-10-26 MED ORDER — BUPIVACAINE-EPINEPHRINE (PF) 0.25% -1:200000 IJ SOLN
INTRAMUSCULAR | Status: AC
  Filled 2015-10-26: qty 30

## 2015-10-26 MED ORDER — STERILE WATER FOR IRRIGATION IR SOLN
Status: DC | PRN
  Administered 2015-10-26: 180 mL

## 2015-10-26 MED ORDER — SCOPOLAMINE 1 MG/3DAYS TD PT72
1.0000 | MEDICATED_PATCH | Freq: Once | TRANSDERMAL | Status: DC
  Administered 2015-10-26: 1.5 mg via TRANSDERMAL

## 2015-10-26 MED ORDER — ESMOLOL HCL 100 MG/10ML IV SOLN
INTRAVENOUS | Status: DC | PRN
  Administered 2015-10-26: 30 mg via INTRAVENOUS
  Administered 2015-10-26: 20 mg via INTRAVENOUS

## 2015-10-26 MED ORDER — ACETAMINOPHEN 10 MG/ML IV SOLN
1000.0000 mg | Freq: Once | INTRAVENOUS | Status: AC
  Administered 2015-10-26: 1000 mg via INTRAVENOUS
  Filled 2015-10-26: qty 100

## 2015-10-26 MED ORDER — OXYCODONE-ACETAMINOPHEN 5-325 MG PO TABS
1.0000 | ORAL_TABLET | Freq: Once | ORAL | Status: AC
  Administered 2015-10-26: 1 via ORAL

## 2015-10-26 MED ORDER — LIDOCAINE HCL (CARDIAC) 20 MG/ML IV SOLN
INTRAVENOUS | Status: DC | PRN
  Administered 2015-10-26: 100 mg via INTRAVENOUS

## 2015-10-26 MED ORDER — SCOPOLAMINE 1 MG/3DAYS TD PT72
MEDICATED_PATCH | TRANSDERMAL | Status: AC
  Administered 2015-10-26: 1.5 mg via TRANSDERMAL
  Filled 2015-10-26: qty 1

## 2015-10-26 MED ORDER — LABETALOL HCL 5 MG/ML IV SOLN
INTRAVENOUS | Status: DC | PRN
  Administered 2015-10-26: 5 mg via INTRAVENOUS

## 2015-10-26 MED ORDER — ONDANSETRON HCL 4 MG/2ML IJ SOLN
INTRAMUSCULAR | Status: AC
  Filled 2015-10-26: qty 2

## 2015-10-26 MED ORDER — PROPOFOL 10 MG/ML IV BOLUS
INTRAVENOUS | Status: AC
  Filled 2015-10-26: qty 20

## 2015-10-26 MED ORDER — ONDANSETRON HCL 4 MG/2ML IJ SOLN
INTRAMUSCULAR | Status: DC | PRN
  Administered 2015-10-26: 4 mg via INTRAVENOUS

## 2015-10-26 MED ORDER — HEPARIN SODIUM (PORCINE) 5000 UNIT/ML IJ SOLN
INTRAMUSCULAR | Status: AC
  Filled 2015-10-26: qty 1

## 2015-10-26 MED ORDER — DEXAMETHASONE SODIUM PHOSPHATE 4 MG/ML IJ SOLN
INTRAMUSCULAR | Status: AC
  Filled 2015-10-26: qty 1

## 2015-10-26 MED ORDER — METHYLENE BLUE 1 % INJ SOLN
INTRAMUSCULAR | Status: AC
  Filled 2015-10-26: qty 1

## 2015-10-26 MED ORDER — IBUPROFEN 600 MG PO TABS: ORAL_TABLET | ORAL | Status: DC

## 2015-10-26 MED ORDER — OXYCODONE-ACETAMINOPHEN 5-325 MG PO TABS
ORAL_TABLET | ORAL | Status: AC
  Filled 2015-10-26: qty 1

## 2015-10-26 MED ORDER — ONDANSETRON HCL 4 MG/2ML IJ SOLN: 4.0000 mg | Freq: Once | INTRAMUSCULAR | Status: DC | PRN

## 2015-10-26 MED ORDER — LABETALOL HCL 5 MG/ML IV SOLN
INTRAVENOUS | Status: AC
  Filled 2015-10-26: qty 4

## 2015-10-26 MED ORDER — ROCURONIUM BROMIDE 100 MG/10ML IV SOLN
INTRAVENOUS | Status: DC | PRN
  Administered 2015-10-26: 40 mg via INTRAVENOUS

## 2015-10-26 SURGICAL SUPPLY — 32 items
BARRIER ADHS 3X4 INTERCEED (GAUZE/BANDAGES/DRESSINGS) IMPLANT
CABLE HIGH FREQUENCY MONO STRZ (ELECTRODE) ×4 IMPLANT
CHLORAPREP W/TINT 26ML (MISCELLANEOUS) ×4 IMPLANT
CLOTH BEACON ORANGE TIMEOUT ST (SAFETY) ×4 IMPLANT
DRSG COVADERM PLUS 2X2 (GAUZE/BANDAGES/DRESSINGS) IMPLANT
DRSG OPSITE POSTOP 3X4 (GAUZE/BANDAGES/DRESSINGS) ×8 IMPLANT
DRSG VASELINE 3X18 (GAUZE/BANDAGES/DRESSINGS) IMPLANT
FORCEPS CUTTING 33CM 5MM (CUTTING FORCEPS) IMPLANT
FORCEPS CUTTING 45CM 5MM (CUTTING FORCEPS) IMPLANT
GLOVE BIO SURGEON STRL SZ 6.5 (GLOVE) ×8 IMPLANT
GLOVE BIOGEL PI IND STRL 7.0 (GLOVE) ×9 IMPLANT
GLOVE BIOGEL PI INDICATOR 7.0 (GLOVE) ×3
GOWN STRL REUS W/TWL LRG LVL3 (GOWN DISPOSABLE) ×12 IMPLANT
LIQUID BAND (GAUZE/BANDAGES/DRESSINGS) ×4 IMPLANT
MANIPULATOR UTERINE 4.5 ZUMI (MISCELLANEOUS) ×4 IMPLANT
NS IRRIG 1000ML POUR BTL (IV SOLUTION) ×4 IMPLANT
PACK LAPAROSCOPY BASIN (CUSTOM PROCEDURE TRAY) ×4 IMPLANT
PAD POSITIONING PINK XL (MISCELLANEOUS) ×4 IMPLANT
POUCH SPECIMEN RETRIEVAL 10MM (ENDOMECHANICALS) IMPLANT
SET IRRIG TUBING LAPAROSCOPIC (IRRIGATION / IRRIGATOR) IMPLANT
SHEARS HARMONIC ACE PLUS 36CM (ENDOMECHANICALS) IMPLANT
SLEEVE XCEL OPT CAN 5 100 (ENDOMECHANICALS) ×4 IMPLANT
SOLUTION ELECTROLUBE (MISCELLANEOUS) IMPLANT
SUT MNCRL AB 3-0 PS2 27 (SUTURE) ×4 IMPLANT
SUT VICRYL 0 ENDOLOOP (SUTURE) IMPLANT
SUT VICRYL 0 UR6 27IN ABS (SUTURE) ×4 IMPLANT
TOWEL OR 17X24 6PK STRL BLUE (TOWEL DISPOSABLE) ×8 IMPLANT
TRAY FOLEY CATH SILVER 14FR (SET/KITS/TRAYS/PACK) ×4 IMPLANT
TROCAR BALLN 12MMX100 BLUNT (TROCAR) ×4 IMPLANT
TROCAR XCEL NON-BLD 5MMX100MML (ENDOMECHANICALS) ×4 IMPLANT
WARMER LAPAROSCOPE (MISCELLANEOUS) ×4 IMPLANT
WATER STERILE IRR 1000ML POUR (IV SOLUTION) IMPLANT

## 2015-10-26 NOTE — Interval H&P Note (Signed)
History and Physical Interval Note:  10/26/2015 2:58 PM  Penny [name]  has presented today for surgery, with the diagnosis of Chronic Pelvic Pain, Abnormal placement of Filshie Clip  The various methods of treatment have been discussed with the patient and family. After consideration of risks, benefits and other options for treatment, the patient has consented to  Procedure(s): LAPAROSCOPY OPERATIVE (N/A) BILATERAL SALPINGECTOMY (Bilateral) as a surgical intervention .  The patient's history has been reviewed, patient examined, no change in status, stable for surgery.  I have reviewed the patient's chart and labs.  Questions were answered to the patient's satisfaction.     [facility] A

## 2015-10-26 NOTE — Anesthesia Preprocedure Evaluation (Addendum)
Anesthesia Evaluation  Patient identified by MRN, date of birth, ID band Patient awake    Reviewed: Allergy & Precautions, NPO status , Patient's Chart, lab work & pertinent test results  History of Anesthesia Complications Negative for: history of anesthetic complications  Airway Mallampati: II  TM Distance: >3 FB Neck ROM: Full    Dental no notable dental hx. (+) Dental Advisory Given, Poor Dentition, Chipped   Pulmonary Current Smoker,    Pulmonary exam normal breath sounds clear to auscultation       Cardiovascular negative cardio ROS Normal cardiovascular exam Rhythm:Regular Rate:Normal     Neuro/Psych  Headaches, PSYCHIATRIC DISORDERS Anxiety Depression    GI/Hepatic negative GI ROS, Neg liver ROS,   Endo/Other  obesity  Renal/GU negative Renal ROS  negative genitourinary   Musculoskeletal negative musculoskeletal ROS (+)   Abdominal   Peds negative pediatric ROS (+)  Hematology negative hematology ROS (+)   Anesthesia Other Findings   Reproductive/Obstetrics negative OB ROS                            Anesthesia Physical Anesthesia Plan  ASA: II  Anesthesia Plan: General   Post-op Pain Management:    Induction: Intravenous  Airway Management Planned: Oral ETT  Additional Equipment:   Intra-op Plan:   Post-operative Plan: Extubation in OR  Informed Consent: I have reviewed the patients History and Physical, chart, labs and discussed the procedure including the risks, benefits and alternatives for the proposed anesthesia with the patient or authorized representative who has indicated his/her understanding and acceptance.   Dental advisory given  Plan Discussed with: CRNA  Anesthesia Plan Comments:         Anesthesia Quick Evaluation

## 2015-10-26 NOTE — Discharge Instructions (Signed)
Call [facility] @ [phone] if:  You have a temperature greater than or equal to 100.4 degrees Farenheit orally You have pain that is not made better by the pain medication given and taken as directed You have excessive bleeding or problems urinating  Take Colace (Docusate Sodium/Stool Softener) 100 mg 2-3 times daily while taking narcotic pain medicine to avoid constipation or until bowel movements are regular. Take Ibuprofen 600 mg  every 6 hours with food for 5 days then as needed for pain  You may drive after 48 hours or once you are no longer taking narcotic pain medication You may walk up steps You may shower tomorrow  You may resume a regular diet Keep incisions clean and dry   Avoid anything in vagina for 6 weeks  until after your post-operative visit Keep your follow up appointment with Dr. Charlesetta Garibaldi on November 11, 2015 at 3:30 p.m. Adhesions Adhesions are stringy (fibrous) bands of tissue. Adhesions are similar to scars, but they are on the inside of your body. Adhesions form between two surfaces of the body. CAUSES   The most common adhesions are those that occur following surgery. Touching and moving things within the belly (abdomen) leads to inflammation and adhesions can form. Not all people get adhesions following surgery. But, adhesions may happen even with the gentlest handling of the organs in the abdomen. There is no way to predict who will have adhesions. The adhesions can occur between:  Loops of bowel.  The bowel and other organs such as the liver.  The contents in the pelvis such as the uterus, bladder, ovaries and tubes.  Inflammation within the abdomen even if there is no surgery. An example would be an infection in the tubes and uterus (pelvic inflammatory disease). Any infection will cause inflammation that may lead to adhesions.  Radiation treatment. SYMPTOMS  Many people have no signs or symptoms from adhesions. However, adhesions may  cause a number of symptoms. Some of these are:  Abdominal pain, tenderness or cramping.  Abdominal bloating.  Constipation or diarrhea.  Vomiting.  Painful sex.  In females, difficulty getting pregnant. DIAGNOSIS   An exam by your caregiver may suggest that adhesions are present.  A surgical procedure using a small incision and a thin scope can be used to look inside the abdomen at the adhesions.  Sometimes x-rays may suggest adhesions inside the abdomen. TREATMENT  Surgery can be performed to separate the adhesions. This often takes care of the problems caused by the adhesions, although surgery may also cause more adhesions to develop. PROGNOSIS   The outcome is usually good if an operation is used to fix adhesions inside the belly.  All adhesions may reoccur and the problem can come back. HOME CARE INSTRUCTIONS   Take usual medications as directed by your caregiver.  Only take over-the-counter or prescription medicines for pain, discomfort or fever as directed by your caregiver.  Pay attention to the pain:  Has it changed?  Has it moved?  Is it gone?  Do not eat solid food until your pain is gone.  While you have pain: Stay on a clear liquid diet. A clear liquid is one you can see through (water, weak tea, broth or bouillon, lemon lime carbonated drinks, gelatin, popsicles or ice chips).  When your pain is gone: Start a light diet (dry toast, crackers, applesauce, white rice, bananas, broth or bouillon). Increase the diet slowly as long as it does not bother you. No dairy products (  including cheese and eggs) and no spicy, fatty, fried or high fiber foods. Your caregiver will tell you if you should be on a special diet.  No alcohol, caffeine or cigarettes.  If your caregiver has given you a follow-up appointment, it is very important to keep that appointment. Not keeping the appointment could result in a permanent injury or lasting (chronic) pain or disability. SEEK  IMMEDIATE MEDICAL CARE IF:   Your pain is not gone in 24 hours.  Your pain becomes worse, changes location or feels different.  You have a fever.  Your vomiting will not stop.  You have blood or brown flecks (like coffee grounds) in your vomit.  You have blood in your bowel movements.  Your bowel movements are dark or black.  Your bowel movements stop (become blocked) or you can not pass gas.   This information is not intended to replace advice given to you by your health care provider. Make sure you discuss any questions you have with your health care provider.   Document Released: 01/21/2004 Document Revised: 01/23/2012 Document Reviewed: 08/21/2009 Elsevier Interactive Patient Education 2016 [employer] Diagnostic Laparoscopy A diagnostic laparoscopy is a procedure to diagnose diseases in the abdomen. During the procedure, a thin, lighted, pencil-sized instrument called a laparoscope is inserted into the abdomen through an incision. The laparoscope allows your health care provider to look at the organs inside your body. LET [facility] CARE PROVIDER KNOW ABOUT:  Any allergies you have.  All medicines you are taking, including vitamins, herbs, eye drops, creams, and over-the-counter medicines.  Previous problems you or members of your family have had with the use of anesthetics.  Any blood disorders you have.  Previous surgeries you have had.  Medical conditions you have. RISKS AND COMPLICATIONS  Generally, this is a safe procedure. However, problems can occur, which may include:  Infection.  Bleeding.  Damage to other organs.  Allergic reaction to the anesthetics used during the procedure. BEFORE THE PROCEDURE  Do not eat or drink anything after midnight on the night before the procedure or as directed by your health care provider.  Ask your health care provider about:  Changing or stopping your regular medicines.  Taking medicines such as aspirin and  ibuprofen. These medicines can thin your blood. Do not take these medicines before your procedure if your health care provider instructs you not to.  Plan to have someone take you home after the procedure. PROCEDURE  You may be given a medicine to help you relax (sedative).  You will be given a medicine to make you sleep (general anesthetic).  Your abdomen will be inflated with a gas. This will make your organs easier to see.  Small incisions will be made in your abdomen.  A laparoscope and other small instruments will be inserted into the abdomen through the incisions.  A tissue sample may be removed from an organ in the abdomen for examination.  The instruments will be removed from the abdomen.  The gas will be released.  The incisions will be closed with stitches (sutures). AFTER THE PROCEDURE  Your blood pressure, heart rate, breathing rate, and blood oxygen level will be monitored often until the medicines you were given have worn off.   This information is not intended to replace advice given to you by your health care provider. Make sure you discuss any questions you have with your health care provider.   Document Released: 02/06/2001 Document Revised: 07/22/2015 Document Reviewed: 06/13/2014 Elsevier Interactive  Patient Education 2016 [employer] INSTRUCTIONS: Laparoscopy  The following instructions have been prepared to help you care for yourself upon your return home today.  Wound care:  Do not get the incision wet for the first 24 hours. The incision should be kept clean and dry.  The Band-Aids or dressings may be removed the day after surgery.  Should the incision become sore, red, and swollen after the first week, check with your doctor.  Personal hygiene:  Shower the day after your procedure.  Activity and limitations:  Do NOT drive or operate any equipment today.  Do NOT lift anything more than 15 pounds for 2-3 weeks after surgery.  Do  NOT rest in bed all day.  Walking is encouraged. Walk each day, starting slowly with 5-minute walks 3 or 4 times a day. Slowly increase the length of your walks.  Walk up and down stairs slowly.  Do NOT do strenuous activities, such as golfing, playing tennis, bowling, running, biking, weight lifting, gardening, mowing, or vacuuming for 2-4 weeks. Ask your doctor when it is okay to start.  Diet: Eat a light meal as desired this evening. You may resume your usual diet tomorrow.  Return to work: This is dependent on the type of work you do. For the most part you can return to a desk job within a week of surgery. If you are more active at work, please discuss this with your doctor.  What to expect after your surgery: You may have a slight burning sensation when you urinate on the first day. You may have a very small amount of blood in the urine. Expect to have a small amount of vaginal discharge/light bleeding for 1-2 weeks. It is not unusual to have abdominal soreness and bruising for up to 2 weeks. You may be tired and need more rest for about 1 week. You may experience shoulder pain for 24-72 hours. Lying flat in bed may relieve it.  Call your doctor for any of the following:  Develop a fever of 100.4 or greater  Inability to urinate 6 hours after discharge from hospital  Severe pain not relieved by pain medications  Persistent of heavy bleeding at incision site  Redness or swelling around incision site after a week  Increasing nausea or vomiting  Patient Signature________________________________________ Nurse Signature_________________________________________

## 2015-10-26 NOTE — Anesthesia Procedure Notes (Signed)
Procedure Name: Intubation Performed by: Barkley Boards L Pre-anesthesia Checklist: Patient identified, Patient being monitored, Timeout performed, Emergency Drugs available and Suction available Patient Re-evaluated:Patient Re-evaluated prior to inductionOxygen Delivery Method: Circle System Utilized Preoxygenation: Pre-oxygenation with 100% oxygen Intubation Type: IV induction Ventilation: Mask ventilation without difficulty Laryngoscope Size: Mac and 3 Grade View: Grade I Tube type: Oral Tube size: 7.0 mm Number of attempts: 1 Airway Equipment and Method: stylet Placement Confirmation: ETT inserted through vocal cords under direct vision,  positive ETCO2 and breath sounds checked- equal and bilateral Secured at: 21 cm Tube secured with: Tape Dental Injury: Teeth and Oropharynx as per pre-operative assessment

## 2015-10-26 NOTE — Transfer of Care (Signed)
Immediate Anesthesia Transfer of Care Note  Patient: Penny [name]  Procedure(s) Performed: Procedure(s): LAPAROSCOPY OPERATIVE WITH REMOVAL OF MISPLACED FILSHE CLIP (N/A) LAPAROSCOPIC LYSIS OF ADHESIONS ATTEMPTED CHROMOPERTUBATION  Patient Location: PACU  Anesthesia Type:General  Level of Consciousness: sedated  Airway & Oxygen Therapy: Patient Spontanous Breathing and Patient connected to nasal cannula oxygen  Post-op Assessment: Report given to RN and Post -op Vital signs reviewed and stable  Post vital signs: stable  Last Vitals:  Filed Vitals:   10/26/15 1245  BP: 146/91  Pulse: 115  Temp: 36.8 C  Resp: 16    Complications: No apparent anesthesia complications

## 2015-10-26 NOTE — H&P (View-Only) (Signed)
Penny [name] is a 34 y.o. female  P 1-2-0-3  who presents for laparoscopy because of chronic pelvic pain.  For several years the patient has had intermittent pelvic pain that was more of a nuisance than a  bother.  Within the past 6 months however,  she  began to have daily, primarily right sided, crampy/achy pelvic pain.  Her pain is made worse by certain activities, including intercourse but has not caused any changes in bowel or bladder function.  The pain is such that she requires Ibuprofen 800 mg, often with Tramadol for relief.  Her menstrual flow is monthly for 7 days and she  changes her pad 5 times a day for 3 of those days.  The remaining days only require twice a day pad change.  In August 2016 she had a CT of her abdomen and pelvis that was normal except that it was noted that her  Filshie Clip,  used for sterilization appeared displaced on the right. A pelvic ultrasound at that same time showed a uterus measuring 6.4 x 5.7 x 9.2 cm with well placed IUD and normal appearing ovaries.  The patient subsequently had her IUD removed and was placed on birth control pills however, her pelvic pain persisted.  The patient was given the medical and surgical management options for her pelvic pain however, she has decided to proceed with surgical evaluation and possible management.   Past Medical History  OB History: G: 3;  P: 1-2-0-3;  SVB 2003 and C-section 2010 and 2013  GYN History: menarche: 11    LMP: 09/28/2015     Contracepton oral contraceptives (estrogen/progesterone)  The patient reports a past history of: chlamydia and trichomonas.  Denies history of abnormal PAP smear.   Last PAP smear-ASCUS with negative HPV  Medical History: ADHD, Scoliosis, Migraines, Depression and  Anxiety   Surgical History: 2013 Tubal Sterilization with Filshie Clips Denies problems with anesthesia or history of blood transfusions  Family History: Asthma, Breast Cancer, Pancreatic Cancer, Hypertension and  Diabetes Mellitus  Social History: Married and Stay at [employer];  Denies Alcohol use but Smokes 2 Cigarettes a day (in the process of quitting)   Medication:  Camerese 0.15/30  daily Valtrex 500 mg prn as directed Ibuprofen 800 mg  with food,  every 8 hours prn Cyclobenzaprine 5 mg tid prn  No Known Allergies  But sensitive to shellfish-causes itching and lip swelling  Denies sensitivity to peanuts,  soy, latex or adhesives.   ROS: Admits to lower back pain and headache with vision changes (migraine) but  denies corrective lenses,  headache, vision changes, nasal congestion, dysphagia, tinnitus, dizziness, hoarseness, cough,  chest pain, shortness of breath, nausea, vomiting, diarrhea,constipation,  urinary frequency, urgency  dysuria, hematuria, vaginitis symptoms, pelvic pain, swelling of joints,easy bruising,  myalgias, arthralgias, skin rashes, unexplained weight loss and except as is mentioned in the history of present illness, patient's review of systems is otherwise negative.   Physical Exam  Bp:  108/62   P: 84  R: 20  Temperature: 99.1 degrees F orally    Weight: 188 lbs. Height: 5\' 3"   BMI: 33.3  Neck: supple without masses or thyromegaly Lungs: clear to auscultation Heart: regular rate and rhythm Abdomen: soft, supra-pubic -tenderness and no organomegaly Pelvic:EGBUS- wnl; vagina-normal rugae; uterus-normal size, cervix without lesions or motion tenderness; adnexae-right sided  tenderness greater than left or masses Extremities:  no clubbing, cyanosis or edema   Assesment:  Chronic Pelvic Pain   Disposition:  Reviewed with the patient the indication for her procedure along with the risks of surgery to include, but not limited to: reaction to anesthesia, damage to adjacent organs, infection and excessive bleeding. The patient verbalized understanding of these risks and has consented to proceed with Diagnostic Laparoscopy with Possible Removal of Filshie Clip   at [facility] on Deceber 12, 2016.     CSN# [id number]   Dellar Traber J. Florene Glen, PA-C  for Dr. Franklyn Lor. Dillard

## 2015-10-26 NOTE — Anesthesia Postprocedure Evaluation (Signed)
Anesthesia Post Note  Patient: Penny [name]  Procedure(s) Performed: Procedure(s) (LRB): LAPAROSCOPY OPERATIVE WITH REMOVAL OF MISPLACED FILSHE CLIP (N/A) LAPAROSCOPIC LYSIS OF ADHESIONS ATTEMPTED CHROMOPERTUBATION  Patient location during evaluation: PACU Anesthesia Type: General Level of consciousness: awake and alert Pain management: pain level controlled Vital Signs Assessment: post-procedure vital signs reviewed and stable Respiratory status: spontaneous breathing, nonlabored ventilation, respiratory function stable and patient connected to nasal cannula oxygen Cardiovascular status: blood pressure returned to baseline and stable Postop Assessment: no signs of nausea or vomiting Anesthetic complications: no    Last Vitals:  Filed Vitals:   10/26/15 1245 10/26/15 1747  BP: 146/91   Pulse: 115   Temp: 36.8 C 37.1 C  Resp: 16     Last Pain:  Filed Vitals:   10/26/15 1810  PainSc: Asleep                 Zenaida Deed

## 2015-10-26 NOTE — Op Note (Signed)
Diagnostic Laparoscopy Procedure Note  Indications: The patient is a 34 y.o. female with CPP.  Pre-operative Diagnosis: CPP  Post-operative Diagnosis: SAME  Surgeon: [id number]   Assistants: Penny Regal PA  Anesthesia: General endotracheal anesthesia  ASA Class: PER ANESTHESIA  Procedure Details  The patient was seen in the Holding Room. The risks, benefits, complications, treatment options, and expected outcomes were discussed with the patient. The possibilities of reaction to medication, pulmonary aspiration, perforation of viscus, bleeding, recurrent infection, the need for additional procedures, failure to diagnose a condition, and creating a complication requiring transfusion or operation were discussed with the patient. The patient concurred with the proposed plan, giving informed consent. The patient was taken to the Operating Room, identified as Penny [name] and the procedure verified as Diagnostic Laparoscopy. A Time Out was held and the above information confirmed.  After induction of general anesthesia, the patient was placed in modified dorsal lithotomy position where she was prepped, draped, and catheterized in the normal, sterile fashion.  The cervix was visualized and an intrauterine manipulator was placed.  Attention  was then turned to the abdomen.  A 2 cm infraumbilical incision was made with scalpel. Is carried down to the fascia. She was then incised and the peritoneum and fascia was entered using a hemostat.  The incision was extended bilaterally using Mayo scissors. 0 Vicryl was used to circumscribe the fascia. Penny [name] was placed into the abdominal cavity. Abdominal placement was confirmed with the laparoscope and the abdomen was insufflated with CO2 gas. Before  the incision was made at the umbilicus Marcaine mixture was used.  The findings will be noted below. 5 mm trochars were placed in the right and left lower quadrants. The patient's uterus was normal there  is a small tiny fibroid seen on the uterus. Patient's left fallopian tubes Filshie clip placed. filshie clip  Was Not Seen on the Right Fallopian Tube, but you can tell whether tube had been interrupted.  The Filshie clip was seen in the posterior cul-de-sac and it was removed from the pelvis.  chromo Pertubation was then done in the tubes were not found to be patent.  Tissue dense omental anterior abdominal wall adhesions. These adhesions were lysed using Kleppingers and scissors. Through the operative scope in through the right and left lower quadrants. Hemostasis was assured. She was removed under direct visualization. The umbilicus fascia was repacked made by tying the 0 Vicryl suture. The skin elderly the umbilicus was closed using 4-0 Monocryl subarticular fashion. Skin incisions were closed with local bonds well.  Instrument, sponge, and needle counts were correct prior to abdominal closure and at the conclusion of the case.   Findings: The anterior cul-de-sac and round ligaments appear normal The uterus normal sent for small fibroid. The adnexa appeared normal the Filshie clip was seen on the patient's left fallopian tube there is no clip seen on the patient's right fallopian tube but still appeared interrupted. And no fluid was seen at chromopertubation. Cul-de-sac appeared normal.  Estimated Blood Loss:  Minimal         Drains: na         Total IV Fluids: 1288mL         Specimens: NoneNone; patient tolerated the procedure well.              Complications:  None; patient tolerated the procedure well.         Disposition: PACU - hemodynamically stable.  Condition: stable

## 2015-10-27 ENCOUNTER — Encounter (HOSPITAL_COMMUNITY): Payer: Self-pay | Admitting: Obstetrics and Gynecology

## 2015-12-03 ENCOUNTER — Encounter (HOSPITAL_COMMUNITY): Payer: Self-pay | Admitting: *Deleted

## 2015-12-03 ENCOUNTER — Emergency Department (INDEPENDENT_AMBULATORY_CARE_PROVIDER_SITE_OTHER)
Admission: EM | Admit: 2015-12-03 | Discharge: 2015-12-03 | Disposition: A | Discharge status: Home / Self Care | Payer: Medicaid Other | Source: Home / Self Care | Attending: Family Medicine | Admitting: Family Medicine

## 2015-12-03 DIAGNOSIS — F411 Generalized anxiety disorder: Secondary | ICD-10-CM | POA: Diagnosis not present

## 2015-12-03 LAB — POCT URINALYSIS DIP (DEVICE)
Bilirubin Urine: NEGATIVE
Glucose, UA: NEGATIVE mg/dL
Ketones, ur: NEGATIVE mg/dL
Leukocytes, UA: NEGATIVE
Nitrite: NEGATIVE
Protein, ur: NEGATIVE mg/dL
Specific Gravity, Urine: >=1.03 (ref 1.005–1.030)
Urobilinogen, UA: 0.2 mg/dL (ref 0.0–1.0)
pH: 5.5 (ref 5.0–8.0)

## 2015-12-03 LAB — POCT I-STAT, CHEM 8
BUN: 7 mg/dL (ref 6–20)
Calcium, Ion: 1.16 mmol/L (ref 1.12–1.23)
Chloride: 104 mmol/L (ref 101–111)
Creatinine, Ser: 0.6 mg/dL (ref 0.44–1.00)
Glucose, Bld: 117 mg/dL — ABNORMAL HIGH (ref 65–99)
HCT: 49 % — ABNORMAL HIGH (ref 36.0–46.0)
Hemoglobin: 16.7 g/dL — ABNORMAL HIGH (ref 12.0–15.0)
Potassium: 3.8 mmol/L (ref 3.5–5.1)
Sodium: 142 mmol/L (ref 135–145)
TCO2: 26 mmol/L (ref 0–100)

## 2015-12-03 LAB — POCT PREGNANCY, URINE: Preg Test, Ur: NEGATIVE

## 2015-12-03 MED ORDER — LORAZEPAM 1 MG PO TABS: 1.0000 mg | ORAL_TABLET | Freq: Three times a day (TID) | ORAL | Status: DC

## 2015-12-03 NOTE — ED Notes (Signed)
Pt has  ultiple   Symptoms     To  Include     Headache        r  Earache  Chills  Nausea     Sweating         Symptoms  Worse  After he   Woke  Up  Today

## 2015-12-03 NOTE — Discharge Instructions (Signed)
See your doctor if further problems. °

## 2015-12-03 NOTE — ED Provider Notes (Signed)
CSN: [id number]     Arrival date & time 12/03/15  1819 History   First MD Initiated Contact with Patient 12/03/15 1913     Chief Complaint  Patient presents with  . Headache   (Consider location/radiation/quality/duration/timing/severity/associated sxs/prior Treatment) Patient is a 35 y.o. female presenting with URI. The history is provided by the patient.  URI Presenting symptoms: congestion, ear pain and rhinorrhea   Presenting symptoms: no fever   Severity:  Mild Onset quality:  Gradual Duration:  6 days Chronicity:  New Relieved by:  None tried Worsened by:  Nothing tried Ineffective treatments:  None tried Associated symptoms: headaches     Past Medical History  Diagnosis Date  . Preterm labor     c/s at 36 wks  . Placenta previa     hx and with current pregnancy  . Migraines     last one over 1 month ago  . Anxiety     no meds  . Depression     no meds  . ADHD (attention deficit hyperactivity disorder)   . Scoliosis    Past Surgical History  Procedure Laterality Date  . Cesarean section  2010    x 1 at 36 wks in [place]  . Svd  2003    x 1 in [place]  . Wisdom tooth extraction    . Tubal ligation    . Cesarean section  February 13, 2012  . Laparoscopy N/A 10/26/2015    Procedure: LAPAROSCOPY OPERATIVE WITH REMOVAL OF MISPLACED FILSHE CLIP;  Surgeon: Crawford Givens, MD;  Location: [facility];  Service: Gynecology;  Laterality: N/A;  . Laparoscopic lysis of adhesions  10/26/2015    Procedure: LAPAROSCOPIC LYSIS OF ADHESIONS;  Surgeon: Crawford Givens, MD;  Location: [facility];  Service: Gynecology;;  . Chromopertubation  10/26/2015    Procedure: ATTEMPTED CHROMOPERTUBATION;  Surgeon: Crawford Givens, MD;  Location: [facility];  Service: Gynecology;;   Family History  Problem Relation Age of Onset  . Cancer Mother   . Hypertension Sister   . Sickle cell trait Daughter   . Asthma Son   . Asthma Paternal Grandmother   . Pseudochol deficiency Neg Hx   . Malignant hyperthermia  Neg Hx   . Anesthesia problems Neg Hx   . Hypotension Neg Hx    Social History  Substance Use Topics  . Smoking status: Current Every Day Smoker -- 0.10 packs/day for 16 years    Types: Cigarettes, E-cigarettes  . Smokeless tobacco: Never Used  . Alcohol Use: No     Comment: on occ   OB History    Gravida Para Term Preterm AB TAB SAB Ectopic Multiple Living   3 3 1 2  0 0 0 0 0 3     Review of Systems  Constitutional: Negative.  Negative for fever.  HENT: Positive for congestion, ear pain, rhinorrhea and sinus pressure.   Gastrointestinal: Positive for nausea. Negative for vomiting and diarrhea.  Musculoskeletal: Negative.   Skin: Negative.   Neurological: Positive for headaches.  All other systems reviewed and are negative.   Allergies  Review of patient's allergies indicates no known allergies.  Home Medications   Prior to Admission medications   Medication Sig Start Date End Date Taking? Authorizing Provider  doxycycline (VIBRA-TABS) 100 MG tablet Take 1 tablet (100 mg total) by mouth 2 (two) times daily. 10/26/15   Elmira Powell, PA-C  ibuprofen (ADVIL,MOTRIN) 600 MG tablet 1  tablet with food every 6 hours for 5 days then as  needed for pain. 10/26/15   Elmira Powell, PA-C  LORazepam (ATIVAN) 1 MG tablet Take 1 tablet (1 mg total) by mouth every 8 (eight) hours. For anxiety 12/03/15   Billy Fischer, MD  oxyCODONE-acetaminophen (PERCOCET/ROXICET) 5-325 MG tablet Take 1-2 tablets by mouth every 6 (six) hours as needed for severe pain. 10/26/15   Earnstine Regal, PA-C   Meds Ordered and Administered this Visit  Medications - No data to display  BP 137/93 mmHg  Pulse 103  Temp(Src) 97.9 F (36.6 C) (Oral)  Resp 18  SpO2 97%  LMP 11/04/2015 No data found.   Physical Exam  Constitutional: She is oriented to person, place, and time. She appears well-developed and well-nourished.  HENT:  Right Ear: External ear normal.  Left Ear: External ear normal.  Mouth/Throat:  Oropharynx is clear and moist.  Eyes: Conjunctivae are normal. Pupils are equal, round, and reactive to light.  Neck: Normal range of motion. Neck supple.  Cardiovascular: Normal heart sounds and intact distal pulses.   Pulmonary/Chest: Effort normal and breath sounds normal.  Lymphadenopathy:    She has no cervical adenopathy.  Neurological: She is alert and oriented to person, place, and time.  Skin: Skin is warm and dry.  Nursing note and vitals reviewed.   ED Course  Procedures (including critical care time)  Labs Review Labs Reviewed  POCT URINALYSIS DIP (DEVICE) - Abnormal; Notable for the following:    Hgb urine dipstick SMALL (*)    All other components within normal limits  POCT I-STAT, CHEM 8 - Abnormal; Notable for the following:    Glucose, Bld 117 (*)    Hemoglobin 16.7 (*)    HCT 49.0 (*)    All other components within normal limits  POCT PREGNANCY, URINE   upreg neg i-stat   bs 117 U/a neg.  Imaging Review No results found.   Visual Acuity Review  Right Eye Distance:   Left Eye Distance:   Bilateral Distance:    Right Eye Near:   Left Eye Near:    Bilateral Near:         MDM   1. Generalized anxiety disorder        Billy Fischer, MD 12/03/15 2001

## 2015-12-05 ENCOUNTER — Emergency Department (HOSPITAL_COMMUNITY)
Admission: EM | Admit: 2015-12-05 | Discharge: 2015-12-05 | Disposition: A | Discharge status: Home / Self Care | Payer: Medicaid Other | Attending: Emergency Medicine | Admitting: Emergency Medicine

## 2015-12-05 ENCOUNTER — Emergency Department (HOSPITAL_COMMUNITY): Payer: Medicaid Other

## 2015-12-05 ENCOUNTER — Encounter (HOSPITAL_COMMUNITY): Payer: Self-pay | Admitting: Emergency Medicine

## 2015-12-05 DIAGNOSIS — Z79899 Other long term (current) drug therapy: Secondary | ICD-10-CM | POA: Insufficient documentation

## 2015-12-05 DIAGNOSIS — R000 Tachycardia, unspecified: Secondary | ICD-10-CM | POA: Insufficient documentation

## 2015-12-05 DIAGNOSIS — Z8679 Personal history of other diseases of the circulatory system: Secondary | ICD-10-CM | POA: Diagnosis not present

## 2015-12-05 DIAGNOSIS — F419 Anxiety disorder, unspecified: Secondary | ICD-10-CM | POA: Diagnosis not present

## 2015-12-05 DIAGNOSIS — R51 Headache: Secondary | ICD-10-CM | POA: Insufficient documentation

## 2015-12-05 DIAGNOSIS — R531 Weakness: Secondary | ICD-10-CM | POA: Diagnosis not present

## 2015-12-05 DIAGNOSIS — H9201 Otalgia, right ear: Secondary | ICD-10-CM | POA: Diagnosis not present

## 2015-12-05 DIAGNOSIS — R251 Tremor, unspecified: Secondary | ICD-10-CM | POA: Insufficient documentation

## 2015-12-05 DIAGNOSIS — Z8739 Personal history of other diseases of the musculoskeletal system and connective tissue: Secondary | ICD-10-CM | POA: Diagnosis not present

## 2015-12-05 DIAGNOSIS — F17210 Nicotine dependence, cigarettes, uncomplicated: Secondary | ICD-10-CM | POA: Diagnosis not present

## 2015-12-05 DIAGNOSIS — R110 Nausea: Secondary | ICD-10-CM | POA: Diagnosis present

## 2015-12-05 DIAGNOSIS — Z792 Long term (current) use of antibiotics: Secondary | ICD-10-CM | POA: Insufficient documentation

## 2015-12-05 DIAGNOSIS — Z3202 Encounter for pregnancy test, result negative: Secondary | ICD-10-CM | POA: Diagnosis not present

## 2015-12-05 DIAGNOSIS — F329 Major depressive disorder, single episode, unspecified: Secondary | ICD-10-CM | POA: Diagnosis not present

## 2015-12-05 DIAGNOSIS — H538 Other visual disturbances: Secondary | ICD-10-CM | POA: Insufficient documentation

## 2015-12-05 LAB — COMPREHENSIVE METABOLIC PANEL
ALT: 23 U/L (ref 14–54)
AST: 20 U/L (ref 15–41)
Albumin: 3.7 g/dL (ref 3.5–5.0)
Alkaline Phosphatase: 75 U/L (ref 38–126)
Anion gap: 12 (ref 5–15)
BUN: 8 mg/dL (ref 6–20)
CO2: 23 mmol/L (ref 22–32)
Calcium: 9.2 mg/dL (ref 8.9–10.3)
Chloride: 104 mmol/L (ref 101–111)
Creatinine, Ser: 0.66 mg/dL (ref 0.44–1.00)
GFR calc Af Amer: >60 mL/min (ref >60)
GFR calc non Af Amer: >60 mL/min (ref >60)
Glucose, Bld: 126 mg/dL — ABNORMAL HIGH (ref 65–99)
Potassium: 3.8 mmol/L (ref 3.5–5.1)
Sodium: 139 mmol/L (ref 135–145)
Total Bilirubin: 0.4 mg/dL (ref 0.3–1.2)
Total Protein: 7 g/dL (ref 6.5–8.1)

## 2015-12-05 LAB — URINALYSIS, ROUTINE W REFLEX MICROSCOPIC
Bilirubin Urine: NEGATIVE
Glucose, UA: NEGATIVE mg/dL
Ketones, ur: NEGATIVE mg/dL
Leukocytes, UA: NEGATIVE
Nitrite: NEGATIVE
Protein, ur: NEGATIVE mg/dL
Specific Gravity, Urine: 1.025 (ref 1.005–1.030)
pH: 6.5 (ref 5.0–8.0)

## 2015-12-05 LAB — CBC
HCT: 38.8 % (ref 36.0–46.0)
Hemoglobin: 13.4 g/dL (ref 12.0–15.0)
MCH: 28.8 pg (ref 26.0–34.0)
MCHC: 34.5 g/dL (ref 30.0–36.0)
MCV: 83.3 fL (ref 78.0–100.0)
Platelets: 314 10*3/uL (ref 150–400)
RBC: 4.66 MIL/uL (ref 3.87–5.11)
RDW: 13.8 % (ref 11.5–15.5)
WBC: 12.1 10*3/uL — ABNORMAL HIGH (ref 4.0–10.5)

## 2015-12-05 LAB — URINE MICROSCOPIC-ADD ON: WBC, UA: NONE SEEN WBC/hpf (ref 0–5)

## 2015-12-05 LAB — LIPASE, BLOOD: Lipase: 43 U/L (ref 11–51)

## 2015-12-05 LAB — POC URINE PREG, ED: Preg Test, Ur: NEGATIVE

## 2015-12-05 LAB — I-STAT TROPONIN, ED: Troponin i, poc: 0 ng/mL (ref 0.00–0.08)

## 2015-12-05 MED ORDER — SODIUM CHLORIDE 0.9 % IV SOLN: INTRAVENOUS | Status: DC

## 2015-12-05 MED ORDER — SODIUM CHLORIDE 0.9 % IV BOLUS (SEPSIS)
1000.0000 mL | Freq: Once | INTRAVENOUS | Status: AC
  Administered 2015-12-05: 1000 mL via INTRAVENOUS

## 2015-12-05 MED ORDER — DIPHENHYDRAMINE HCL 50 MG/ML IJ SOLN
25.0000 mg | Freq: Once | INTRAMUSCULAR | Status: AC
  Administered 2015-12-05: 25 mg via INTRAVENOUS
  Filled 2015-12-05: qty 1

## 2015-12-05 MED ORDER — METOCLOPRAMIDE HCL 5 MG/ML IJ SOLN
10.0000 mg | Freq: Once | INTRAMUSCULAR | Status: AC
  Administered 2015-12-05: 10 mg via INTRAVENOUS
  Filled 2015-12-05: qty 2

## 2015-12-05 NOTE — ED Notes (Signed)
PO challenge provided to patient

## 2015-12-05 NOTE — ED Provider Notes (Signed)
CSN: [id number]     Arrival date & time 12/05/15  0041 History  By signing my name below, I, Julien Nordmann, attest that this documentation has been prepared under the direction and in the presence of Lacretia Leigh, MD. Electronically Signed: Julien Nordmann, ED Scribe. 12/05/2015. 1:33 AM.    Chief Complaint  Patient presents with  . Chest Pain  . Nausea     The history is provided by the patient. No language interpreter was used.   HPI Comments: Penny [name] is a 35 y.o. female who has a hx of placenta previa, migraines, anxiety and scoliosis presents to the Emergency Department complaining of multiple complaints. Pt reports having nausea for one week with associated blurry vision, hand shaking and headaches. She also states having right ear pain with muffled hearing. She notes that if feels as if she has cotton balls stuck in her ear. She reports her hand shaking will come on randomly. States she took Ativan last week when she was seen by UC and reports her symptoms became worse afterwards. Pt notes when she stands up, she cannot "keep herself up" and feels as if she is going to lose her balance. Pt denies new medication, fever, cough, congestion, abdominal pain, and hx of DM.  Past Medical History  Diagnosis Date  . Preterm labor     c/s at 36 wks  . Placenta previa     hx and with current pregnancy  . Migraines     last one over 1 month ago  . Anxiety     no meds  . Depression     no meds  . ADHD (attention deficit hyperactivity disorder)   . Scoliosis    Past Surgical History  Procedure Laterality Date  . Cesarean section  2010    x 1 at 36 wks in [place]  . Svd  2003    x 1 in [place]  . Wisdom tooth extraction    . Tubal ligation    . Cesarean section  February 13, 2012  . Laparoscopy N/A 10/26/2015    Procedure: LAPAROSCOPY OPERATIVE WITH REMOVAL OF MISPLACED FILSHE CLIP;  Surgeon: Crawford Givens, MD;  Location: [facility];  Service: Gynecology;  Laterality: N/A;  .  Laparoscopic lysis of adhesions  10/26/2015    Procedure: LAPAROSCOPIC LYSIS OF ADHESIONS;  Surgeon: Crawford Givens, MD;  Location: [facility];  Service: Gynecology;;  . Chromopertubation  10/26/2015    Procedure: ATTEMPTED CHROMOPERTUBATION;  Surgeon: Crawford Givens, MD;  Location: [facility];  Service: Gynecology;;   Family History  Problem Relation Age of Onset  . Cancer Mother   . Hypertension Sister   . Sickle cell trait Daughter   . Asthma Son   . Asthma Paternal Grandmother   . Pseudochol deficiency Neg Hx   . Malignant hyperthermia Neg Hx   . Anesthesia problems Neg Hx   . Hypotension Neg Hx    Social History  Substance Use Topics  . Smoking status: Current Every Day Smoker -- 0.10 packs/day for 16 years    Types: Cigarettes, E-cigarettes  . Smokeless tobacco: Never Used  . Alcohol Use: No     Comment: on occ   OB History    Gravida Para Term Preterm AB TAB SAB Ectopic Multiple Living   3 3 1 2  0 0 0 0 0 3     Review of Systems  Constitutional: Negative for fever.  HENT: Negative for congestion.   Eyes: Positive for visual disturbance.  Respiratory:  Negative for cough.   Gastrointestinal: Positive for nausea. Negative for abdominal pain.  Neurological: Positive for weakness and headaches.  All other systems reviewed and are negative.     Allergies  Review of patient's allergies indicates no known allergies.  Home Medications   Prior to Admission medications   Medication Sig Start Date End Date Taking? Authorizing Provider  doxycycline (VIBRA-TABS) 100 MG tablet Take 1 tablet (100 mg total) by mouth 2 (two) times daily. 10/26/15   Elmira Powell, PA-C  ibuprofen (ADVIL,MOTRIN) 600 MG tablet 1  tablet with food every 6 hours for 5 days then as needed for pain. 10/26/15   Elmira Powell, PA-C  LORazepam (ATIVAN) 1 MG tablet Take 1 tablet (1 mg total) by mouth every 8 (eight) hours. For anxiety 12/03/15   Billy Fischer, MD  oxyCODONE-acetaminophen (PERCOCET/ROXICET) 5-325  MG tablet Take 1-2 tablets by mouth every 6 (six) hours as needed for severe pain. 10/26/15   Earnstine Regal, PA-C   Triage vitals: BP 126/88 mmHg  Pulse 112  Temp(Src) 98.1 F (36.7 C) (Oral)  Resp 18  SpO2 96%  LMP 11/04/2015 Physical Exam  Constitutional: She is oriented to person, place, and time. She appears well-developed and well-nourished.  Non-toxic appearance. No distress.  HENT:  Head: Normocephalic and atraumatic.  Eyes: Conjunctivae, EOM and lids are normal. Pupils are equal, round, and reactive to light.  Neck: Normal range of motion. Neck supple. No tracheal deviation present. No thyroid mass present.  Cardiovascular: Normal rate, regular rhythm and normal heart sounds.  Exam reveals no gallop.   No murmur heard. Pulmonary/Chest: Effort normal and breath sounds normal. No stridor. No respiratory distress. She has no decreased breath sounds. She has no wheezes. She has no rhonchi. She has no rales.  Abdominal: Soft. Normal appearance and bowel sounds are normal. She exhibits no distension. There is no tenderness. There is no rebound and no CVA tenderness.  Musculoskeletal: Normal range of motion. She exhibits no edema or tenderness.  Neurological: She is alert and oriented to person, place, and time. She has normal strength. No cranial nerve deficit or sensory deficit. GCS eye subscore is 4. GCS verbal subscore is 5. GCS motor subscore is 6.  Skin: Skin is warm and dry. No abrasion and no rash noted.  Psychiatric: Her speech is normal and behavior is normal.  Affect blunted and mood is depressed  Nursing note and vitals reviewed.   ED Course  Procedures  DIAGNOSTIC STUDIES: Oxygen Saturation is 96% on RA, adequate by my interpretation.  COORDINATION OF CARE:  1:30 AM Discussed treatment plan which includes IV fluids and lab work with pt at bedside and pt agreed to plan.  Labs Review Labs Reviewed  LIPASE, BLOOD  COMPREHENSIVE METABOLIC PANEL  CBC  URINALYSIS,  ROUTINE W REFLEX MICROSCOPIC (NOT AT [facility])  POC URINE PREG, ED  I-STAT TROPOININ, ED    Imaging Review No results found. I have personally reviewed and evaluated these images and lab results as part of my medical decision-making.   EKG Interpretation None      MDM   Final diagnoses:  None   I personally performed the services described in this documentation, which was scribed in my presence. The recorded information has been reviewed and is accurate.   Patient states that she feels better after receiving medication for headache. Slight tachycardia noted and she states that when she took Ativan prior to arrival that it made her feel more jittery. Is requesting a  home at this time.  Lacretia Leigh, MD 12/05/15 [phone]

## 2015-12-05 NOTE — ED Notes (Signed)
Patient transported to X-ray 

## 2015-12-05 NOTE — ED Notes (Signed)
Pt reports nausea, headaches, tremors, blurred vision, irritable, sweats, and "feeling like something is in my ear" since Friday of last week

## 2015-12-05 NOTE — Discharge Instructions (Signed)

## 2015-12-05 NOTE — ED Notes (Signed)
Pt to ED via [facility] for multiple complaints> Reports nausea x 1 week with intermittent tremors to bilateral hands, blurred vision, RLQ pain, and R ear pain (with muffled hearing).  Also reports L sided chest pain x 30 min.

## 2016-01-18 IMAGING — DX DG CHEST 2V
2 series · 2 of 2 positions shown · non-contrast
Comparison: Chest radiograph performed 10/06/2014

CLINICAL DATA: Mid chest pain and shortness of breath, acute onset.
Initial encounter.

EXAM:
CHEST  2 VIEW

[chest pa]
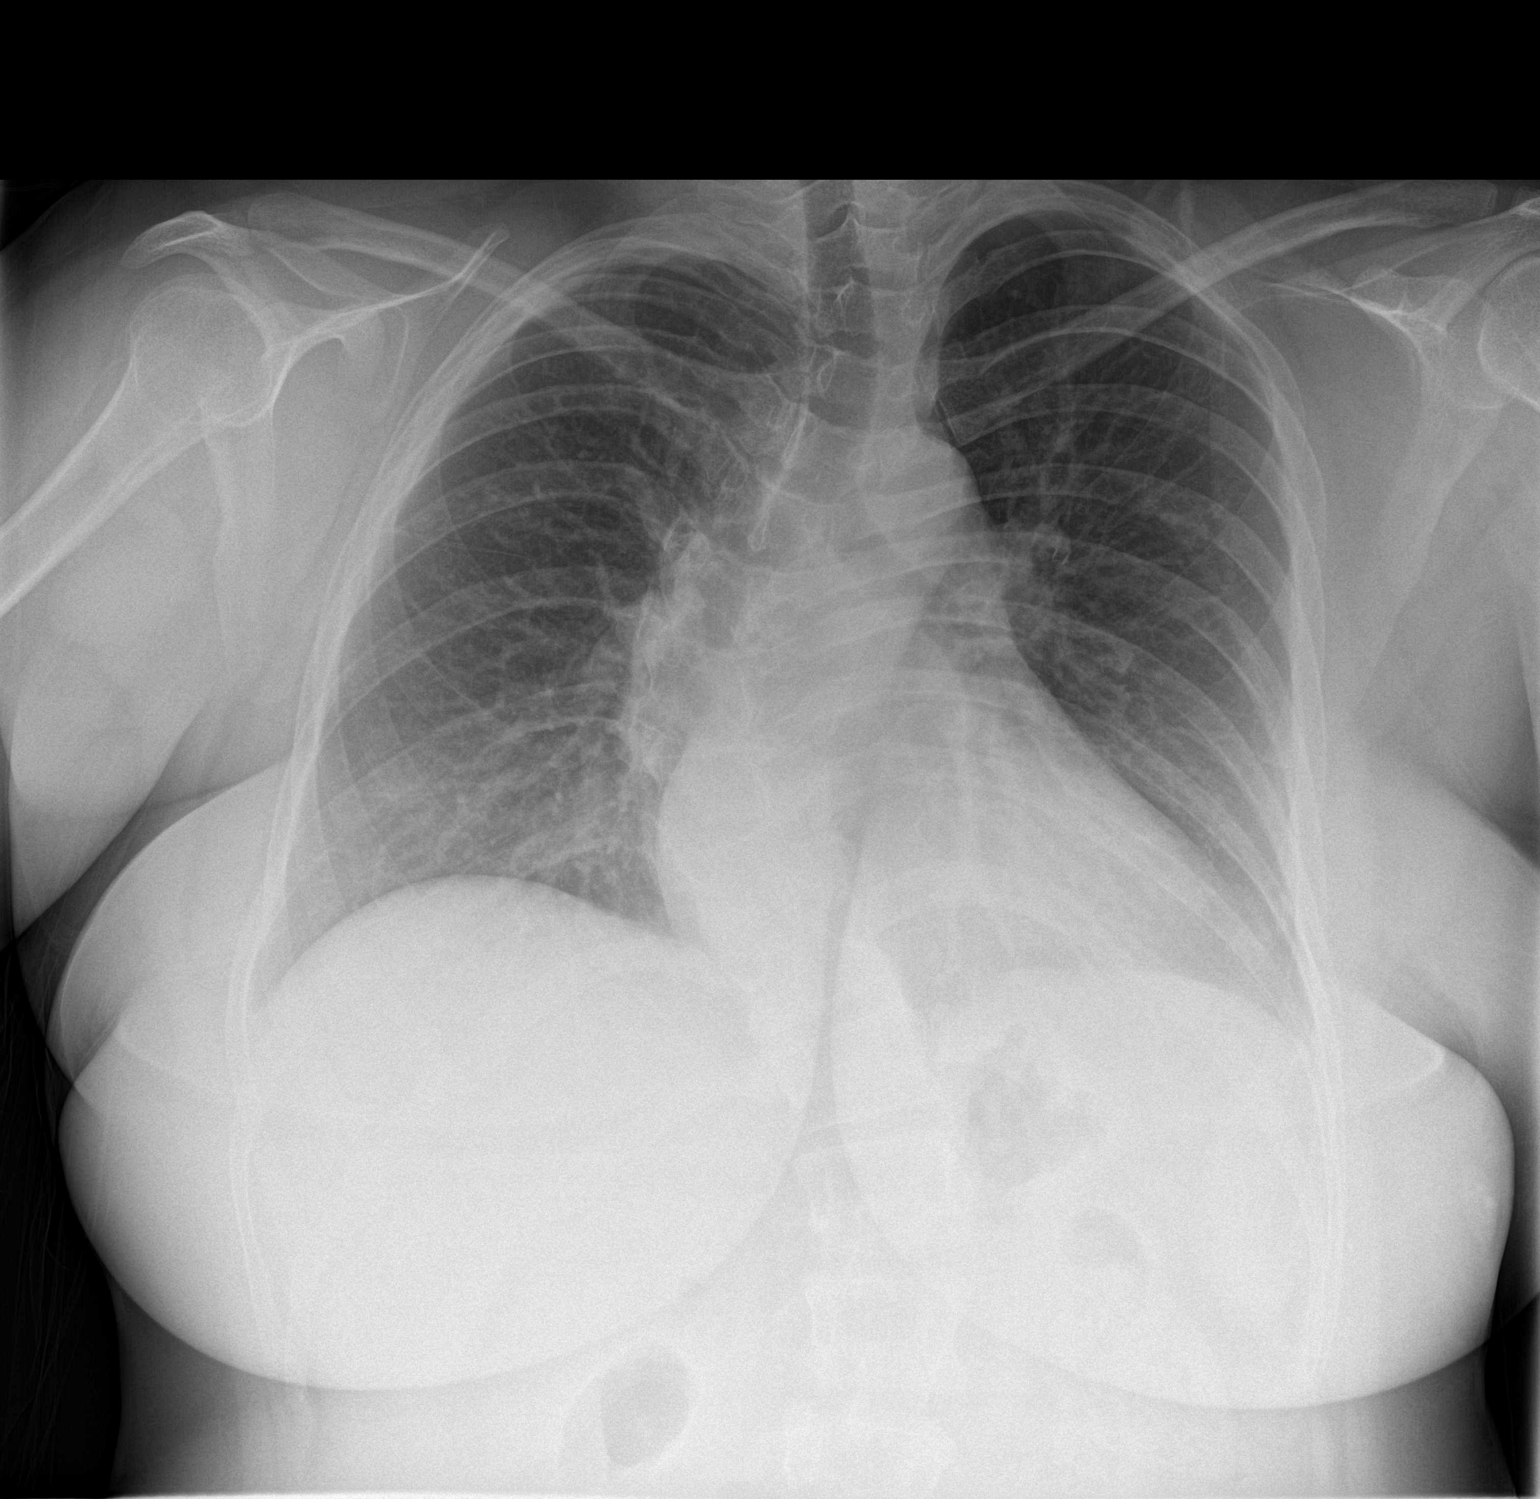

[chest lat]
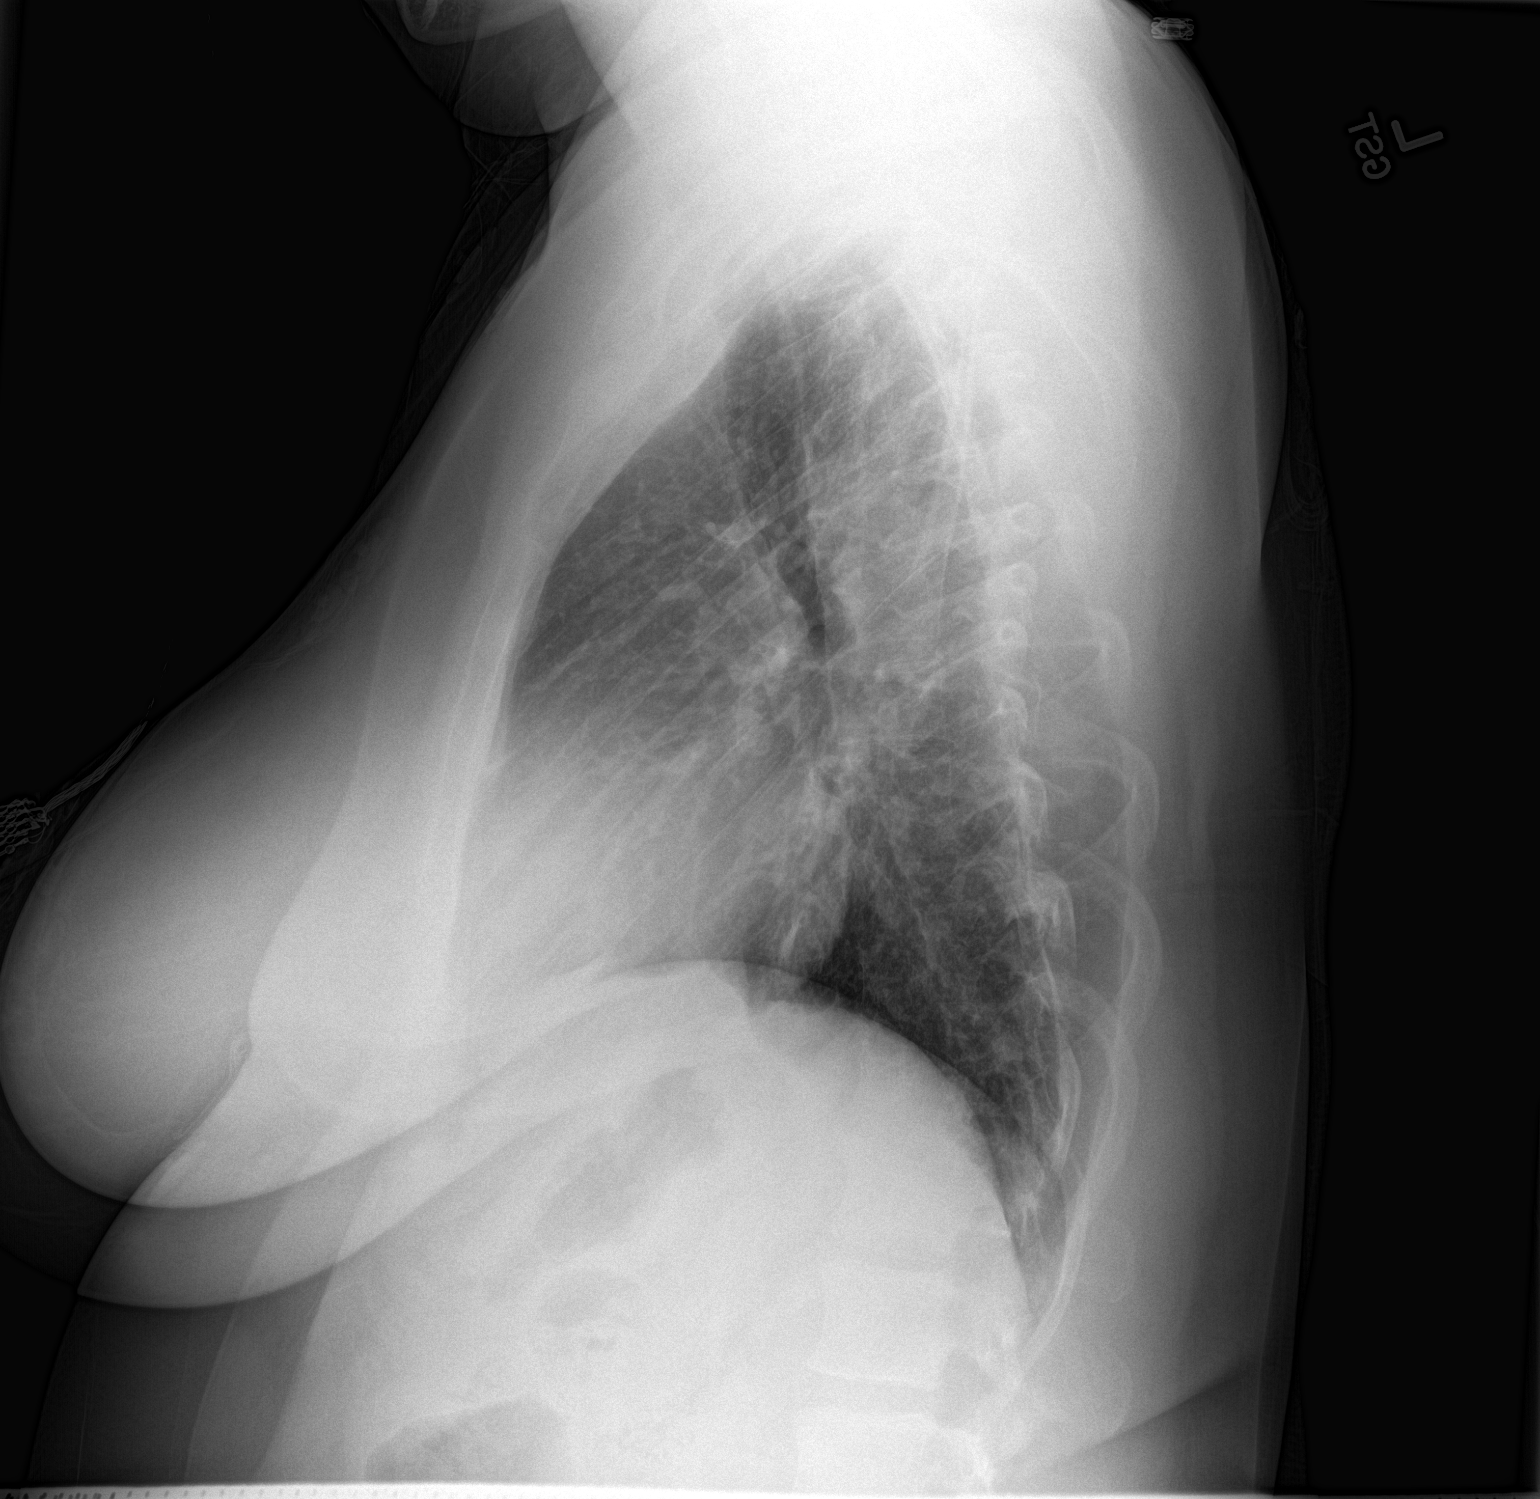

[2 of 2 positions shown; findings below may reference images not displayed]

FINDINGS: The lungs are well-aerated. Minimal retrocardiac airspace opacity
may reflect atelectasis or mild pneumonia. There is no evidence of
pleural effusion or pneumothorax.

The heart is borderline normal in size; the mediastinal contour is
within normal limits. No acute osseous abnormalities are seen. Right
convex thoracic scoliosis is again noted.
IMPRESSION: 1. Minimal retrocardiac opacity may reflect atelectasis or possibly
mild pneumonia.
2. Right convex thoracic scoliosis noted.

## 2016-01-22 ENCOUNTER — Inpatient Hospital Stay (HOSPITAL_COMMUNITY)
Admission: AD | Admit: 2016-01-22 | Discharge: 2016-01-23 | Disposition: A | Discharge status: Home / Self Care | Payer: Medicaid Other | Source: Ambulatory Visit | Attending: Obstetrics & Gynecology | Admitting: Obstetrics & Gynecology

## 2016-01-22 ENCOUNTER — Encounter (HOSPITAL_COMMUNITY): Payer: Self-pay

## 2016-01-22 DIAGNOSIS — F329 Major depressive disorder, single episode, unspecified: Secondary | ICD-10-CM | POA: Insufficient documentation

## 2016-01-22 DIAGNOSIS — F17210 Nicotine dependence, cigarettes, uncomplicated: Secondary | ICD-10-CM | POA: Insufficient documentation

## 2016-01-22 DIAGNOSIS — R1031 Right lower quadrant pain: Secondary | ICD-10-CM | POA: Diagnosis present

## 2016-01-22 DIAGNOSIS — F419 Anxiety disorder, unspecified: Secondary | ICD-10-CM | POA: Insufficient documentation

## 2016-01-22 DIAGNOSIS — D259 Leiomyoma of uterus, unspecified: Secondary | ICD-10-CM

## 2016-01-22 LAB — POCT PREGNANCY, URINE: Preg Test, Ur: NEGATIVE

## 2016-01-22 NOTE — MAU Provider Note (Signed)
History  35 yo non pregnant female presents to MAU after calling w/ c/o new onset of breast tenderness, spotting since end of Jan and right sided groin pain for ~ 7-14 days. Pain unrelieved w/ Aleve and Flexeril. Denies N/V, fever, hematuria, increased urination or hematochezia. Normal appetite. Normal BM on 01/22/16.    Had exploratory lap by Dr. Charlesetta Garibaldi on 10/26/15.  Patient Active Problem List   Diagnosis Date Noted  . Uterine fibroid 01/23/2016  . Right groin pain 01/23/2016  . Migraines   . Anxiety   . Depression     Chief Complaint  Patient presents with  . Abdominal Pain   HPI As above OB History    Gravida Para Term Preterm AB TAB SAB Ectopic Multiple Living   3 3 1 2  0 0 0 0 0 3      Past Medical History  Diagnosis Date  . Preterm labor     c/s at 36 wks  . Placenta previa     hx and with current pregnancy  . Migraines     last one over 1 month ago  . Anxiety     no meds  . Depression     no meds  . ADHD (attention deficit hyperactivity disorder)   . Scoliosis     Past Surgical History  Procedure Laterality Date  . Cesarean section  2010    x 1 at 36 wks in [place]  . Svd  2003    x 1 in [place]  . Wisdom tooth extraction    . Tubal ligation    . Cesarean section  February 13, 2012  . Laparoscopy N/A 10/26/2015    Procedure: LAPAROSCOPY OPERATIVE WITH REMOVAL OF MISPLACED FILSHE CLIP;  Surgeon: Crawford Givens, MD;  Location: [facility];  Service: Gynecology;  Laterality: N/A;  . Laparoscopic lysis of adhesions  10/26/2015    Procedure: LAPAROSCOPIC LYSIS OF ADHESIONS;  Surgeon: Crawford Givens, MD;  Location: [facility];  Service: Gynecology;;  . Chromopertubation  10/26/2015    Procedure: ATTEMPTED CHROMOPERTUBATION;  Surgeon: Crawford Givens, MD;  Location: [facility];  Service: Gynecology;;    Family History  Problem Relation Age of Onset  . Cancer Mother   . Hypertension Sister   . Sickle cell trait Daughter   . Asthma Son   . Asthma Paternal Grandmother   .  Pseudochol deficiency Neg Hx   . Malignant hyperthermia Neg Hx   . Anesthesia problems Neg Hx   . Hypotension Neg Hx     Social History  Substance Use Topics  . Smoking status: Current Every Day Smoker -- 0.10 packs/day for 16 years    Types: Cigarettes, E-cigarettes  . Smokeless tobacco: Never Used  . Alcohol Use: No     Comment: on occ    Allergies: No Known Allergies  Prescriptions prior to admission  Medication Sig Dispense Refill Last Dose  . cyclobenzaprine (FLEXERIL) 10 MG tablet Take 10 mg by mouth 3 (three) times daily as needed for muscle spasms.   01/22/2016 at Unknown time  . Naproxen Sodium (ALEVE PO) Take by mouth.   01/22/2016 at Unknown time    ROS  Right sided groin pain Physical Exam   Results for orders placed or performed during the hospital encounter of 01/22/16 (from the past 24 hour(s))  Urinalysis, Routine w reflex microscopic (not at [facility])     Status: Abnormal   Collection Time: 01/22/16 10:47 PM  Result Value Ref Range   Color, Urine YELLOW  YELLOW   APPearance CLEAR CLEAR   Specific Gravity, Urine 1.010 1.005 - 1.030   pH 6.5 5.0 - 8.0   Glucose, UA NEGATIVE NEGATIVE mg/dL   Hgb urine dipstick LARGE (A) NEGATIVE   Bilirubin Urine NEGATIVE NEGATIVE   Ketones, ur NEGATIVE NEGATIVE mg/dL   Protein, ur NEGATIVE NEGATIVE mg/dL   Nitrite NEGATIVE NEGATIVE   Leukocytes, UA NEGATIVE NEGATIVE  Urine microscopic-add on     Status: Abnormal   Collection Time: 01/22/16 10:47 PM  Result Value Ref Range   Squamous Epithelial / LPF 0-5 (A) NONE SEEN   WBC, UA NONE SEEN 0 - 5 WBC/hpf   RBC / HPF 0-5 0 - 5 RBC/hpf   Bacteria, UA RARE (A) NONE SEEN   Urine-Other MUCOUS PRESENT   Pregnancy, urine POC     Status: None   Collection Time: 01/22/16 11:33 PM  Result Value Ref Range   Preg Test, Ur NEGATIVE NEGATIVE   Blood pressure 138/90, pulse 90, temperature 98.8 F (37.1 C), temperature source Oral, resp. rate 16, weight 88.111 kg (194 lb 4 oz), last  menstrual period 12/08/2015, SpO2 98 %.  Physical Exam Gen: NAD  Lungs: CTAB CV: RRR w/o M/R/G Back: Neg CVAT bilaterally Abdomen: soft, tender to right groin only, active BS x 4, no rebound or guarding Pelvic: Normal uterus, non tender, no CMT or adnexal tenderness/mass. Brownish d/c to examining glove. No odor appreciated  ED Course  Assessment: Pain of unclear etiology  Plan: Pelvic u/s Urine culture   Farrel Gordon CNM, MS 01/23/2016 1:18 AM   ADDENDUM: Preliminary u/s report = neg w/ the exception of a small fundal fibroid (not a new finding) -- .9 x 1.1 x .9 cm Pain relieved w/ Percocet tab D/C home w/ Tramadol tabs per pt request Will call pt if urine culture abnormal Pt to f/u with Dr. Charlesetta Garibaldi early next week Advised to go to local pharmacy x 5-7 days for BP readings and schedule appointment w/ [facility] ASAP for f/u.  Farrel Gordon, CNM 01/23/16, 1:30 AM

## 2016-01-22 NOTE — MAU Note (Signed)
Right lower abd pain since end of July of last year, had exploratory lap on December 12th.  Since then pain has been gone, it returned 2 days ago in same area, right lower abd.  Spotting since January.  Is concerned about being pregnant, having breast tenderness.  Took a home UPT last week, it was neg.

## 2016-01-23 ENCOUNTER — Encounter: Payer: Self-pay | Admitting: Obstetrics and Gynecology

## 2016-01-23 ENCOUNTER — Inpatient Hospital Stay (HOSPITAL_COMMUNITY): Payer: Medicaid Other

## 2016-01-23 DIAGNOSIS — D259 Leiomyoma of uterus, unspecified: Secondary | ICD-10-CM

## 2016-01-23 DIAGNOSIS — R1031 Right lower quadrant pain: Secondary | ICD-10-CM

## 2016-01-23 DIAGNOSIS — Z98891 History of uterine scar from previous surgery: Secondary | ICD-10-CM | POA: Insufficient documentation

## 2016-01-23 LAB — URINALYSIS, ROUTINE W REFLEX MICROSCOPIC
Bilirubin Urine: NEGATIVE
Glucose, UA: NEGATIVE mg/dL
Ketones, ur: NEGATIVE mg/dL
Leukocytes, UA: NEGATIVE
Nitrite: NEGATIVE
Protein, ur: NEGATIVE mg/dL
Specific Gravity, Urine: 1.01 (ref 1.005–1.030)
pH: 6.5 (ref 5.0–8.0)

## 2016-01-23 LAB — URINE MICROSCOPIC-ADD ON: WBC, UA: NONE SEEN WBC/hpf (ref 0–5)

## 2016-01-23 MED ORDER — TRAMADOL HCL 50 MG PO TABS: 50.0000 mg | ORAL_TABLET | Freq: Four times a day (QID) | ORAL | Status: DC | PRN

## 2016-01-23 MED ORDER — OXYCODONE-ACETAMINOPHEN 5-325 MG PO TABS
1.0000 | ORAL_TABLET | Freq: Once | ORAL | Status: AC
  Administered 2016-01-23: 1 via ORAL
  Filled 2016-01-23: qty 1

## 2016-01-24 LAB — URINE CULTURE

## 2016-02-01 ENCOUNTER — Emergency Department (HOSPITAL_COMMUNITY)
Admission: EM | Admit: 2016-02-01 | Discharge: 2016-02-01 | Disposition: A | Discharge status: Home / Self Care | Payer: Medicaid Other | Attending: Emergency Medicine | Admitting: Emergency Medicine

## 2016-02-01 ENCOUNTER — Encounter (HOSPITAL_COMMUNITY): Payer: Self-pay | Admitting: Emergency Medicine

## 2016-02-01 ENCOUNTER — Emergency Department (HOSPITAL_COMMUNITY): Payer: Medicaid Other

## 2016-02-01 DIAGNOSIS — R112 Nausea with vomiting, unspecified: Secondary | ICD-10-CM | POA: Insufficient documentation

## 2016-02-01 DIAGNOSIS — F329 Major depressive disorder, single episode, unspecified: Secondary | ICD-10-CM | POA: Diagnosis not present

## 2016-02-01 DIAGNOSIS — Z8751 Personal history of pre-term labor: Secondary | ICD-10-CM | POA: Insufficient documentation

## 2016-02-01 DIAGNOSIS — F17210 Nicotine dependence, cigarettes, uncomplicated: Secondary | ICD-10-CM | POA: Insufficient documentation

## 2016-02-01 DIAGNOSIS — M791 Myalgia: Secondary | ICD-10-CM | POA: Diagnosis not present

## 2016-02-01 DIAGNOSIS — R0789 Other chest pain: Secondary | ICD-10-CM | POA: Diagnosis not present

## 2016-02-01 DIAGNOSIS — G43909 Migraine, unspecified, not intractable, without status migrainosus: Secondary | ICD-10-CM | POA: Diagnosis not present

## 2016-02-01 DIAGNOSIS — R05 Cough: Secondary | ICD-10-CM | POA: Diagnosis present

## 2016-02-01 DIAGNOSIS — F419 Anxiety disorder, unspecified: Secondary | ICD-10-CM | POA: Diagnosis not present

## 2016-02-01 DIAGNOSIS — M419 Scoliosis, unspecified: Secondary | ICD-10-CM | POA: Insufficient documentation

## 2016-02-01 DIAGNOSIS — J069 Acute upper respiratory infection, unspecified: Secondary | ICD-10-CM | POA: Insufficient documentation

## 2016-02-01 MED ORDER — BENZONATATE 100 MG PO CAPS
100.0000 mg | ORAL_CAPSULE | Freq: Once | ORAL | Status: AC
  Administered 2016-02-01: 100 mg via ORAL
  Filled 2016-02-01: qty 1

## 2016-02-01 MED ORDER — ONDANSETRON 4 MG PO TBDP
4.0000 mg | ORAL_TABLET | Freq: Once | ORAL | Status: AC
  Administered 2016-02-01: 4 mg via ORAL
  Filled 2016-02-01: qty 1

## 2016-02-01 MED ORDER — BENZONATATE 100 MG PO CAPS: 100.0000 mg | ORAL_CAPSULE | Freq: Three times a day (TID) | ORAL | Status: DC

## 2016-02-01 MED ORDER — ONDANSETRON 4 MG PO TBDP: 4.0000 mg | ORAL_TABLET | Freq: Three times a day (TID) | ORAL | Status: DC | PRN

## 2016-02-01 NOTE — ED Notes (Signed)
Pt states chest pain comes from coughing

## 2016-02-01 NOTE — Discharge Instructions (Signed)
1. Medications: tessalon for cough, zofran for nausea, usual home medications 2. Treatment: rest, drink plenty of fluids; try warm honey, tea, throat lozenges for additional symptom relief 3. Follow Up: please followup with your primary doctor for discussion of your diagnoses and further evaluation after today's visit; if you do not have a primary care doctor use the phone number listed in your discharge paperwork to find one; please return to the ER for high fever, shortness of breath, new or worsening symptoms   Upper Respiratory Infection, Adult Most upper respiratory infections (URIs) are caused by a virus. A URI affects the nose, throat, and upper air passages. The most common type of URI is often called "the common cold." HOME CARE   Take medicines only as told by your doctor.  Gargle warm saltwater or take cough drops to comfort your throat as told by your doctor.  Use a warm mist humidifier or inhale steam from a shower to increase air moisture. This may make it easier to breathe.  Drink enough fluid to keep your pee (urine) clear or pale yellow.  Eat soups and other clear broths.  Have a healthy diet.  Rest as needed.  Go back to work when your fever is gone or your doctor says it is okay.  You may need to stay home longer to avoid giving your URI to others.  You can also wear a face mask and wash your hands often to prevent spread of the virus.  Use your inhaler more if you have asthma.  Do not use any tobacco products, including cigarettes, chewing tobacco, or electronic cigarettes. If you need help quitting, ask your doctor. GET HELP IF:  You are getting worse, not better.  Your symptoms are not helped by medicine.  You have chills.  You are getting more short of breath.  You have brown or red mucus.  You have yellow or brown discharge from your nose.  You have pain in your face, especially when you bend forward.  You have a fever.  You have puffy  (swollen) neck glands.  You have pain while swallowing.  You have white areas in the back of your throat. GET HELP RIGHT AWAY IF:   You have very bad or constant:  Headache.  Ear pain.  Pain in your forehead, behind your eyes, and over your cheekbones (sinus pain).  Chest pain.  You have long-lasting (chronic) lung disease and any of the following:  Wheezing.  Long-lasting cough.  Coughing up blood.  A change in your usual mucus.  You have a stiff neck.  You have changes in your:  Vision.  Hearing.  Thinking.  Mood. MAKE SURE YOU:   Understand these instructions.  Will watch your condition.  Will get help right away if you are not doing well or get worse.   This information is not intended to replace advice given to you by your health care provider. Make sure you discuss any questions you have with your health care provider.   Document Released: 04/18/2008 Document Revised: 03/17/2015 Document Reviewed: 02/05/2014 Elsevier Interactive Patient Education [employer].

## 2016-02-01 NOTE — ED Provider Notes (Signed)
CSN: [id number]     Arrival date & time 02/01/16  1351 History  By signing my name below, I, Rayna Sexton, attest that this documentation has been prepared under the direction and in the presence of Nizar Cutler C. Dhrithi Riche, PA-C. Electronically Signed: Rayna Sexton, ED Scribe. 02/01/2016. 3:50 PM.    Chief Complaint  Patient presents with  . Cough  . Nasal Congestion    The history is provided by the patient. No language interpreter was used.     HPI Comments: Penny [name] is a 35 y.o. female who presents to the Emergency Department complaining of constant, moderate, productive cough with blood speckled sputum onset 1 week ago. She reports associated fever, chills, rhinorrhea, chest congestion, mild wheezing, body aches, nausea, vomiting last week which has mostly alleviated and CP when coughing. Pt confirms multiple sick contacts in her home with similar symptoms. She denies diarrhea, abd pain, or any other associated symptoms at this time.   Past Medical History  Diagnosis Date  . Preterm labor     c/s at 36 wks  . Placenta previa     hx and with current pregnancy  . Migraines     last one over 1 month ago  . Anxiety     no meds  . Depression     no meds  . ADHD (attention deficit hyperactivity disorder)   . Scoliosis    Past Surgical History  Procedure Laterality Date  . Cesarean section  2010    x 1 at 36 wks in [place]  . Svd  2003    x 1 in [place]  . Wisdom tooth extraction    . Tubal ligation    . Cesarean section  February 13, 2012  . Laparoscopy N/A 10/26/2015    Procedure: LAPAROSCOPY OPERATIVE WITH REMOVAL OF MISPLACED FILSHE CLIP;  Surgeon: Crawford Givens, MD;  Location: [facility];  Service: Gynecology;  Laterality: N/A;  . Laparoscopic lysis of adhesions  10/26/2015    Procedure: LAPAROSCOPIC LYSIS OF ADHESIONS;  Surgeon: Crawford Givens, MD;  Location: [facility];  Service: Gynecology;;  . Chromopertubation  10/26/2015    Procedure: ATTEMPTED CHROMOPERTUBATION;   Surgeon: Crawford Givens, MD;  Location: [facility];  Service: Gynecology;;   Family History  Problem Relation Age of Onset  . Cancer Mother   . Hypertension Sister   . Sickle cell trait Daughter   . Asthma Son   . Asthma Paternal Grandmother   . Pseudochol deficiency Neg Hx   . Malignant hyperthermia Neg Hx   . Anesthesia problems Neg Hx   . Hypotension Neg Hx    Social History  Substance Use Topics  . Smoking status: Current Every Day Smoker -- 0.10 packs/day for 16 years    Types: Cigarettes, E-cigarettes  . Smokeless tobacco: Never Used  . Alcohol Use: No     Comment: on occ   OB History    Gravida Para Term Preterm AB TAB SAB Ectopic Multiple Living   3 3 1 2  0 0 0 0 0 3      Review of Systems  Constitutional: Positive for fever (98.9 F in triage) and chills.  HENT: Positive for congestion, postnasal drip and rhinorrhea.   Respiratory: Positive for cough and wheezing.   Cardiovascular: Positive for chest pain (when coughing).  Gastrointestinal: Positive for nausea and vomiting. Negative for abdominal pain and diarrhea.  Musculoskeletal: Positive for myalgias.    Allergies  Review of patient's allergies indicates no known allergies.  Home  Medications   Prior to Admission medications   Medication Sig Start Date End Date Taking? Authorizing Provider  benzonatate (TESSALON) 100 MG capsule Take 1 capsule (100 mg total) by mouth every 8 (eight) hours. 02/01/16   Marella Chimes, PA-C  cyclobenzaprine (FLEXERIL) 10 MG tablet Take 10 mg by mouth 3 (three) times daily as needed for muscle spasms.    Historical Provider, MD  ondansetron (ZOFRAN ODT) 4 MG disintegrating tablet Take 1 tablet (4 mg total) by mouth every 8 (eight) hours as needed for nausea. 02/01/16   Marella Chimes, PA-C  traMADol (ULTRAM) 50 MG tablet Take 1 tablet (50 mg total) by mouth every 6 (six) hours as needed for moderate pain or severe pain. 01/23/16   Farrel Gordon, CNM    BP 121/90 mmHg   Pulse 89  Temp(Src) 98.9 F (37.2 C) (Oral)  Resp 16  SpO2 97%  LMP 12/08/2015 Physical Exam  Constitutional: She is oriented to person, place, and time. She appears well-developed and well-nourished. No distress.  HENT:  Head: Normocephalic and atraumatic.  Right Ear: External ear normal.  Left Ear: External ear normal.  Nose: Nose normal.  Mouth/Throat: Uvula is midline, oropharynx is clear and moist and mucous membranes are normal.  Eyes: Conjunctivae, EOM and lids are normal. Pupils are equal, round, and reactive to light. Right eye exhibits no discharge. Left eye exhibits no discharge. No scleral icterus.  Neck: Normal range of motion. Neck supple.  Cardiovascular: Normal rate, regular rhythm, normal heart sounds, intact distal pulses and normal pulses.   Pulmonary/Chest: Effort normal and breath sounds normal. No respiratory distress. She has no wheezes. She has no rales. She exhibits tenderness.  Mild TTP to anterior chest wall.  Abdominal: Soft. Normal appearance and bowel sounds are normal. She exhibits no distension and no mass. There is no tenderness. There is no rigidity, no rebound and no guarding.  Musculoskeletal: Normal range of motion. She exhibits no edema or tenderness.  Neurological: She is alert and oriented to person, place, and time.  Skin: Skin is warm, dry and intact. No rash noted. She is not diaphoretic. No erythema. No pallor.  Psychiatric: She has a normal mood and affect. Her speech is normal and behavior is normal.  Nursing note and vitals reviewed.   ED Course  Procedures   DIAGNOSTIC STUDIES: Oxygen Saturation is 97% on RA, normal by my interpretation.    COORDINATION OF CARE: 3:48 PM Discussed next steps with pt. She verbalized understanding and is agreeable with the plan.   Labs Review Labs Reviewed - No data to display  Imaging Review Dg Chest 2 View  02/01/2016  CLINICAL DATA:  Cough for 2 weeks, smoker EXAM: CHEST  2 VIEW COMPARISON:   12/05/2015 FINDINGS: Cardiomediastinal silhouette is stable. No infiltrate or pleural effusion. No pulmonary edema. Again noted dextroscoliosis of mid thoracic spine and levoscoliosis of the upper lumbar spine. IMPRESSION: No active disease.  Thoracolumbar scoliosis. Electronically Signed   By: Lahoma Crocker M.D.   On: 02/01/2016 15:31   I have personally reviewed and evaluated these images as part of my medical decision-making.   EKG Interpretation None      MDM   Final diagnoses:  URI (upper respiratory infection)    35 year old female presents with subjective fever, chills, nasal congestion, productive cough, and generalized body aches x 1 week. Notes chest pain only with coughing. Reports sick contact. States she experienced vomiting last week. Reports persistent nausea, though vomiting  now resolved. Patient is afebrile. Vital signs stable. No erythema, edema, or exudate to posterior oropharynx. Heart RRR. Lungs clear to auscultation bilaterally. No wheezing or increased work of breathing. Mild TTP to anterior chest wall. Abdomen soft, non-tender, non-distended. CXR ordered in triage, negative for active disease. Discussed findings with patient. Symptoms likely viral. Will give cough medicine and zofran for home. Advised patient increase fluid intake and try warm honey, tea, and throat lozenges for additional symptom relief. Patient to follow-up with PCP. Return precautions discussed. Patient verbalizes her understanding and is in agreement with plan.  BP 121/90 mmHg  Pulse 89  Temp(Src) 98.9 F (37.2 C) (Oral)  Resp 16  SpO2 97%  LMP 12/08/2015  I personally performed the services described in this documentation, which was scribed in my presence. The recorded information has been reviewed and is accurate.    Marella Chimes, PA-C 02/01/16 1558  Milton Ferguson, MD 02/03/16 2012

## 2016-02-01 NOTE — ED Notes (Signed)
Pt c/o cough and congestion x 1 week that is not getting better. Pt c/o chest pain when she coughs or takes deep breaths. A&Ox4 and ambulatory. NAD noted. Pt speaking in complete sentences.

## 2016-04-14 ENCOUNTER — Encounter (HOSPITAL_COMMUNITY): Payer: Self-pay | Admitting: *Deleted

## 2016-04-14 ENCOUNTER — Emergency Department (HOSPITAL_COMMUNITY)
Admission: EM | Admit: 2016-04-14 | Discharge: 2016-04-14 | Disposition: A | Discharge status: Home / Self Care | Payer: Medicaid Other | Attending: Emergency Medicine | Admitting: Emergency Medicine

## 2016-04-14 DIAGNOSIS — Z9851 Tubal ligation status: Secondary | ICD-10-CM | POA: Diagnosis not present

## 2016-04-14 DIAGNOSIS — F17210 Nicotine dependence, cigarettes, uncomplicated: Secondary | ICD-10-CM | POA: Diagnosis not present

## 2016-04-14 DIAGNOSIS — F329 Major depressive disorder, single episode, unspecified: Secondary | ICD-10-CM | POA: Insufficient documentation

## 2016-04-14 DIAGNOSIS — N760 Acute vaginitis: Secondary | ICD-10-CM | POA: Diagnosis not present

## 2016-04-14 DIAGNOSIS — L293 Anogenital pruritus, unspecified: Secondary | ICD-10-CM | POA: Diagnosis present

## 2016-04-14 DIAGNOSIS — F419 Anxiety disorder, unspecified: Secondary | ICD-10-CM | POA: Diagnosis not present

## 2016-04-14 DIAGNOSIS — G43909 Migraine, unspecified, not intractable, without status migrainosus: Secondary | ICD-10-CM | POA: Insufficient documentation

## 2016-04-14 DIAGNOSIS — Z3202 Encounter for pregnancy test, result negative: Secondary | ICD-10-CM | POA: Insufficient documentation

## 2016-04-14 DIAGNOSIS — M419 Scoliosis, unspecified: Secondary | ICD-10-CM | POA: Diagnosis not present

## 2016-04-14 LAB — WET PREP, GENITAL
Clue Cells Wet Prep HPF POC: NONE SEEN
Sperm: NONE SEEN
Trich, Wet Prep: NONE SEEN
Yeast Wet Prep HPF POC: NONE SEEN

## 2016-04-14 LAB — URINALYSIS, ROUTINE W REFLEX MICROSCOPIC
Bilirubin Urine: NEGATIVE
Glucose, UA: NEGATIVE mg/dL
Ketones, ur: NEGATIVE mg/dL
Leukocytes, UA: NEGATIVE
Nitrite: NEGATIVE
Protein, ur: NEGATIVE mg/dL
Specific Gravity, Urine: 1.031 — ABNORMAL HIGH (ref 1.005–1.030)
pH: 6 (ref 5.0–8.0)

## 2016-04-14 LAB — GC/CHLAMYDIA PROBE AMP (~~LOC~~) NOT AT ARMC
Chlamydia: NEGATIVE
Neisseria Gonorrhea: NEGATIVE

## 2016-04-14 LAB — URINE MICROSCOPIC-ADD ON

## 2016-04-14 LAB — I-STAT BETA HCG BLOOD, ED (MC, WL, AP ONLY): I-stat hCG, quantitative: <5 m[IU]/mL (ref <5)

## 2016-04-14 MED ORDER — IBUPROFEN 800 MG PO TABS: 800.0000 mg | ORAL_TABLET | Freq: Three times a day (TID) | ORAL | Status: DC

## 2016-04-14 MED ORDER — ACETAMINOPHEN 325 MG PO TABS
650.0000 mg | ORAL_TABLET | Freq: Once | ORAL | Status: DC
  Filled 2016-04-14: qty 2

## 2016-04-14 MED ORDER — IBUPROFEN 800 MG PO TABS
800.0000 mg | ORAL_TABLET | Freq: Once | ORAL | Status: AC
  Administered 2016-04-14: 800 mg via ORAL
  Filled 2016-04-14: qty 1

## 2016-04-14 NOTE — ED Notes (Signed)
Pt states that she would prefer to have ibuprofen instead of tylenol

## 2016-04-14 NOTE — Discharge Instructions (Signed)

## 2016-04-14 NOTE — ED Provider Notes (Signed)
CSN: [id number]     Arrival date & time 04/14/16  0148 History   First MD Initiated Contact with Patient 04/14/16 0224     Chief Complaint  Patient presents with  . Vaginal Itching     (Consider location/radiation/quality/duration/timing/severity/associated sxs/prior Treatment) HPI Penny [name] is a 35 y.o. female who presents for gradual onset, constant, worsening vaginal itching, burning, dryness, and swelling that began 4 days ago (Monday).  She has tried putting water and vaseline on the area with minimal relief.  No aggravating factors.  She reports she had intercourse 6 times the day before.  Denies new lube or perfumes.  Denies dysuria, hematuria, abdominal pain, vaginal discharge, fever, or chills. LNMP approximately 1 month ago, 03/20/16. She reports she does not use protection and is in a monogamous relationship.  Past Medical History  Diagnosis Date  . Preterm labor     c/s at 36 wks  . Placenta previa     hx and with current pregnancy  . Migraines     last one over 1 month ago  . Anxiety     no meds  . Depression     no meds  . ADHD (attention deficit hyperactivity disorder)   . Scoliosis    Past Surgical History  Procedure Laterality Date  . Cesarean section  2010    x 1 at 36 wks in [place]  . Svd  2003    x 1 in [place]  . Wisdom tooth extraction    . Tubal ligation    . Cesarean section  February 13, 2012  . Laparoscopy N/A 10/26/2015    Procedure: LAPAROSCOPY OPERATIVE WITH REMOVAL OF MISPLACED FILSHE CLIP;  Surgeon: Crawford Givens, MD;  Location: [facility];  Service: Gynecology;  Laterality: N/A;  . Laparoscopic lysis of adhesions  10/26/2015    Procedure: LAPAROSCOPIC LYSIS OF ADHESIONS;  Surgeon: Crawford Givens, MD;  Location: [facility];  Service: Gynecology;;  . Chromopertubation  10/26/2015    Procedure: ATTEMPTED CHROMOPERTUBATION;  Surgeon: Crawford Givens, MD;  Location: [facility];  Service: Gynecology;;   Family History  Problem Relation Age of Onset  . Cancer  Mother   . Hypertension Sister   . Sickle cell trait Daughter   . Asthma Son   . Asthma Paternal Grandmother   . Pseudochol deficiency Neg Hx   . Malignant hyperthermia Neg Hx   . Anesthesia problems Neg Hx   . Hypotension Neg Hx    Social History  Substance Use Topics  . Smoking status: Current Every Day Smoker -- 0.10 packs/day for 16 years    Types: Cigarettes, E-cigarettes  . Smokeless tobacco: Never Used  . Alcohol Use: No     Comment: on occ   OB History    Gravida Para Term Preterm AB TAB SAB Ectopic Multiple Living   3 3 1 2  0 0 0 0 0 3     Review of Systems All other systems negative unless otherwise stated in HPI    Allergies  Review of patient's allergies indicates no known allergies.  Home Medications   Prior to Admission medications   Medication Sig Start Date End Date Taking? Authorizing Provider  benzonatate (TESSALON) 100 MG capsule Take 1 capsule (100 mg total) by mouth every 8 (eight) hours. 02/01/16   Marella Chimes, PA-C  cyclobenzaprine (FLEXERIL) 10 MG tablet Take 10 mg by mouth 3 (three) times daily as needed for muscle spasms.    Historical Provider, MD  ondansetron (ZOFRAN ODT)  4 MG disintegrating tablet Take 1 tablet (4 mg total) by mouth every 8 (eight) hours as needed for nausea. 02/01/16   Marella Chimes, PA-C  traMADol (ULTRAM) 50 MG tablet Take 1 tablet (50 mg total) by mouth every 6 (six) hours as needed for moderate pain or severe pain. 01/23/16   Farrel Gordon, CNM   BP 156/96 mmHg  Pulse 70  Temp(Src) 97.6 F (36.4 C) (Oral)  Resp 18  SpO2 100%  LMP 03/20/2016 Physical Exam  Constitutional: She is oriented to person, place, and time. She appears well-developed and well-nourished.  Non-toxic appearance. She does not have a sickly appearance. She does not appear ill.  HENT:  Head: Normocephalic and atraumatic.  Mouth/Throat: Oropharynx is clear and moist.  Eyes: Conjunctivae are normal.  Neck: Normal range of motion.  Neck supple.  Cardiovascular: Normal rate and regular rhythm.   Pulmonary/Chest: Effort normal and breath sounds normal. No accessory muscle usage or stridor. No respiratory distress. She has no wheezes. She has no rhonchi. She has no rales.  Abdominal: Soft. Bowel sounds are normal. She exhibits no distension. There is no tenderness. There is no rebound and no guarding.  Genitourinary: Uterus normal. There is tenderness on the right labia. There is no rash or lesion on the right labia. There is tenderness on the left labia. There is no rash or lesion on the left labia. Uterus is not tender. Cervix exhibits discharge. Cervix exhibits no motion tenderness and no friability. Right adnexum displays no mass and no tenderness. Left adnexum displays no mass and no tenderness. There is tenderness in the vagina. No erythema or bleeding in the vagina. No foreign body around the vagina. No vaginal discharge found.  Chaperone present throughout entire exam.  No herpetic lesions.   Musculoskeletal: Normal range of motion.  Lymphadenopathy:    She has no cervical adenopathy.  Neurological: She is alert and oriented to person, place, and time.  Speech clear without dysarthria.  Skin: Skin is warm and dry.  Psychiatric: She has a normal mood and affect. Her behavior is normal.    ED Course  Procedures (including critical care time) Labs Review Labs Reviewed  WET PREP, GENITAL - Abnormal; Notable for the following:    WBC, Wet Prep HPF POC MANY (*)    All other components within normal limits  URINALYSIS, ROUTINE W REFLEX MICROSCOPIC (NOT AT [facility])  I-STAT BETA HCG BLOOD, ED ([facility], [facility], [facility] ONLY)  GC/CHLAMYDIA PROBE AMP ([place]) NOT AT [facility]    Imaging Review No results found. I have personally reviewed and evaluated these images and lab results as part of my medical decision-making.   EKG Interpretation None      MDM   Final diagnoses:  Vulvovaginitis   Patient presents with vaginal  itching, burning, and dryness x 4 days.  No systemic symptoms.  No abdominal pain.  Sexually active, unprotective, monogamous relationship.  VSS, NAD.  On exam, heart RRR, lungs CTAB, abdomen soft and benign.  Minimal cervical discharge.  No CMT or adnexal tenderness.  Patient appears well, non-toxic, or septic appearing.  Offered HIV and RPR testing, but patient deferred stating she was tested 6 months ago.  Will obtain hcg, wet prep, GC/chlamdyia. Hcg negative.  Wet prep with many WBCs, no trich, clue cells, or yeast.  Likely vulvovaginitis.  Will recommend warm sitz baths and abstaining from sexual intercourse until symptoms resolve.  Follow up PCP.  Discussed return precautions.  Patient agrees and acknowledges the  above plan for discharge.     Gloriann Loan, PA-C [phone]  Delora Fuel, MD [phone]

## 2016-04-14 NOTE — ED Notes (Signed)
Kayla PA at bedside.  

## 2016-04-14 NOTE — ED Notes (Signed)
Pt c/o vaginal itching, dryness and burning since Monday. Admits to unprotected sex.

## 2016-04-20 ENCOUNTER — Emergency Department (HOSPITAL_COMMUNITY)
Admission: EM | Admit: 2016-04-20 | Discharge: 2016-04-20 | Disposition: A | Discharge status: Home / Self Care | Payer: Medicaid Other | Attending: Emergency Medicine | Admitting: Emergency Medicine

## 2016-04-20 ENCOUNTER — Encounter (HOSPITAL_COMMUNITY): Payer: Self-pay | Admitting: Emergency Medicine

## 2016-04-20 DIAGNOSIS — F909 Attention-deficit hyperactivity disorder, unspecified type: Secondary | ICD-10-CM | POA: Diagnosis not present

## 2016-04-20 DIAGNOSIS — F17210 Nicotine dependence, cigarettes, uncomplicated: Secondary | ICD-10-CM | POA: Diagnosis not present

## 2016-04-20 DIAGNOSIS — R21 Rash and other nonspecific skin eruption: Secondary | ICD-10-CM | POA: Insufficient documentation

## 2016-04-20 DIAGNOSIS — F329 Major depressive disorder, single episode, unspecified: Secondary | ICD-10-CM | POA: Insufficient documentation

## 2016-04-20 DIAGNOSIS — K0889 Other specified disorders of teeth and supporting structures: Secondary | ICD-10-CM | POA: Diagnosis not present

## 2016-04-20 MED ORDER — HYDROCODONE-ACETAMINOPHEN 5-325 MG PO TABS
1.0000 | ORAL_TABLET | Freq: Once | ORAL | Status: AC
  Administered 2016-04-20: 1 via ORAL
  Filled 2016-04-20: qty 1

## 2016-04-20 MED ORDER — DEXAMETHASONE SODIUM PHOSPHATE 10 MG/ML IJ SOLN
10.0000 mg | Freq: Once | INTRAMUSCULAR | Status: AC
Start: 2016-04-20 — End: 2016-04-20
  Administered 2016-04-20: 10 mg via INTRAMUSCULAR
  Filled 2016-04-20: qty 1

## 2016-04-20 NOTE — ED Notes (Signed)
Per EMS, pt had two teeth pulled this morning. Once she got home she realized she had a rash covering her right leg. 50mg  Benadryl with EMS.

## 2016-04-20 NOTE — ED Provider Notes (Signed)
CSN: [id number]     Arrival date & time 04/20/16  1632 History  By signing my name below, I, Eustaquio Maize, attest that this documentation has been prepared under the direction and in the presence of Payton Moder, PA-C.  Electronically Signed: Eustaquio Maize, ED Scribe. 04/20/2016. 5:46 PM.  Chief Complaint  Patient presents with  . Rash    The history is provided by the patient. No language interpreter was used.    HPI Comments: Penny [name] is a 35 y.o. female brought in by ambulance, who presents to the Emergency Department complaining of gradual worsening, non-pruritic, rash on right thigh onset of a few hours PTA. Pt had 2 teeth pulled this morning at the dentists office. She states that she went home, took 800 mg Ibuprofen, and went outside for some time. When pt came back inside and changed clothes, she noticed the rash to her right thigh. No known bug bites while outside. Pt has not started her Amoxicillin which was prescribed by dentist. Pt was given 2 Benadryl tablets en route with mild relief. Pt denies throat or tongue swelling or shortness of breath.    Past Medical History  Diagnosis Date  . Preterm labor     c/s at 36 wks  . Placenta previa     hx and with current pregnancy  . Migraines     last one over 1 month ago  . Anxiety     no meds  . Depression     no meds  . ADHD (attention deficit hyperactivity disorder)   . Scoliosis    Past Surgical History  Procedure Laterality Date  . Cesarean section  2010    x 1 at 36 wks in [place]  . Svd  2003    x 1 in [place]  . Wisdom tooth extraction    . Tubal ligation    . Cesarean section  February 13, 2012  . Laparoscopy N/A 10/26/2015    Procedure: LAPAROSCOPY OPERATIVE WITH REMOVAL OF MISPLACED FILSHE CLIP;  Surgeon: Crawford Givens, MD;  Location: [facility];  Service: Gynecology;  Laterality: N/A;  . Laparoscopic lysis of adhesions  10/26/2015    Procedure: LAPAROSCOPIC LYSIS OF ADHESIONS;  Surgeon: Crawford Givens,  MD;  Location: [facility];  Service: Gynecology;;  . Chromopertubation  10/26/2015    Procedure: ATTEMPTED CHROMOPERTUBATION;  Surgeon: Crawford Givens, MD;  Location: [facility];  Service: Gynecology;;   Family History  Problem Relation Age of Onset  . Cancer Mother   . Hypertension Sister   . Sickle cell trait Daughter   . Asthma Son   . Asthma Paternal Grandmother   . Pseudochol deficiency Neg Hx   . Malignant hyperthermia Neg Hx   . Anesthesia problems Neg Hx   . Hypotension Neg Hx    Social History  Substance Use Topics  . Smoking status: Current Every Day Smoker -- 0.10 packs/day for 16 years    Types: Cigarettes, E-cigarettes  . Smokeless tobacco: Never Used  . Alcohol Use: No     Comment: on occ   OB History    Gravida Para Term Preterm AB TAB SAB Ectopic Multiple Living   3 3 1 2  0 0 0 0 0 3     Review of Systems  HENT:       - Throat swelling - Tongue swelling  Respiratory: Negative for shortness of breath.   Skin: Positive for rash.       - Bite  Allergies  Review of patient's allergies indicates no known allergies.  Home Medications   Prior to Admission medications   Medication Sig Start Date End Date Taking? Authorizing Provider  ibuprofen (ADVIL,MOTRIN) 800 MG tablet Take 1 tablet (800 mg total) by mouth 3 (three) times daily. 04/14/16   Kayla Rose, PA-C   BP 142/101 mmHg  Pulse 80  Temp(Src) 99 F (37.2 C) (Oral)  Resp 15  SpO2 98%  LMP 03/20/2016 Physical Exam  Constitutional: She is oriented to person, place, and time. She appears well-developed and well-nourished. No distress.  HENT:  Head: Normocephalic and atraumatic.  Extracted left lower second and third molars. Mild surrounding gum swelling. No bleeding. No facial swelling. No swelling of the tongue, oropharynx, uvula.  Eyes: Conjunctivae and EOM are normal.  Neck: Neck supple. No tracheal deviation present.  Cardiovascular: Normal rate, regular rhythm and normal heart sounds.    Pulmonary/Chest: Effort normal and breath sounds normal. No respiratory distress. She has no wheezes. She has no rales.  Musculoskeletal: Normal range of motion.  Neurological: She is alert and oriented to person, place, and time.  Skin: Skin is warm and dry.  Small, 4 x 4 centimeter area of mottled skin to the right anterior thigh.  Psychiatric: She has a normal mood and affect. Her behavior is normal.  Nursing note and vitals reviewed.   ED Course  Procedures (including critical care time) DIAGNOSTIC STUDIES: Oxygen Saturation is 98% on RA, normal by my interpretation.    COORDINATION OF CARE: 5:38 PM-Discussed treatment plan which includes Vicodin tablet and Decadron injection with pt at bedside and pt agreed to plan.      EKG Interpretation None     MDM   Final diagnoses:  None   Patient with a small area of mottled skin to the right anterior thigh. No hives noted. There is marked area around the thigh which apparently was marked by EMS upon their arrival which was all erythematous. It improved after Benadryl. Patient is not having any dental related issues other than pain and will give her an Norco for pain. Will also administer IM shot of Decadron for reaction, patient will continue Benadryl at home. No signs of throat swelling, tongue swelling, lip swelling, difficulty breathing. She is stable for discharge home at this time.  Filed Vitals:   04/20/16 1642  BP: 142/101  Pulse: 80  Temp: 99 F (37.2 C)  TempSrc: Oral  Resp: 15  SpO2: 98%    I personally performed the services described in this documentation, which was scribed in my presence. The recorded information has been reviewed and is accurate.   Jeannett Senior, PA-C 04/20/16 [place], MD 04/20/16 2020

## 2016-04-20 NOTE — Discharge Instructions (Signed)
Take Benadryl 25 mg every 4-6 hours. Keep an eye on the rash. If it is worsening, not improved with Benadryl, or if develops swelling of the lips, tongue, throat return to emergency department.  Hives Hives are itchy, red, swollen areas of the skin. They can vary in size and location on your body. Hives can come and go for hours or several days (acute hives) or for several weeks (chronic hives). Hives do not spread from person to person (noncontagious). They may get worse with scratching, exercise, and emotional stress. CAUSES   Allergic reaction to food, additives, or drugs.  Infections, including the common cold.  Illness, such as vasculitis, lupus, or thyroid disease.  Exposure to sunlight, heat, or cold.  Exercise.  Stress.  Contact with chemicals. SYMPTOMS   Red or white swollen patches on the skin. The patches may change size, shape, and location quickly and repeatedly.  Itching.  Swelling of the hands, feet, and face. This may occur if hives develop deeper in the skin. DIAGNOSIS  Your caregiver can usually tell what is wrong by performing a physical exam. Skin or blood tests may also be done to determine the cause of your hives. In some cases, the cause cannot be determined. TREATMENT  Mild cases usually get better with medicines such as antihistamines. Severe cases may require an emergency epinephrine injection. If the cause of your hives is known, treatment includes avoiding that trigger.  HOME CARE INSTRUCTIONS   Avoid causes that trigger your hives.  Take antihistamines as directed by your caregiver to reduce the severity of your hives. Non-sedating or low-sedating antihistamines are usually recommended. Do not drive while taking an antihistamine.  Take any other medicines prescribed for itching as directed by your caregiver.  Wear loose-fitting clothing.  Keep all follow-up appointments as directed by your caregiver. SEEK MEDICAL CARE IF:   You have persistent  or severe itching that is not relieved with medicine.  You have painful or swollen joints. SEEK IMMEDIATE MEDICAL CARE IF:   You have a fever.  Your tongue or lips are swollen.  You have trouble breathing or swallowing.  You feel tightness in the throat or chest.  You have abdominal pain. These problems may be the first sign of a life-threatening allergic reaction. Call your local emergency services (911 in [place] MAKE SURE YOU:   Understand these instructions.  Will watch your condition.  Will get help right away if you are not doing well or get worse.   This information is not intended to replace advice given to you by your health care provider. Make sure you discuss any questions you have with your health care provider.   Document Released: 10/31/2005 Document Revised: 11/05/2013 Document Reviewed: 01/24/2012 Elsevier Interactive Patient Education [employer].

## 2016-04-27 ENCOUNTER — Encounter (HOSPITAL_COMMUNITY): Payer: Self-pay | Admitting: Emergency Medicine

## 2016-04-27 ENCOUNTER — Ambulatory Visit (HOSPITAL_COMMUNITY)
Admission: EM | Admit: 2016-04-27 | Discharge: 2016-04-27 | Disposition: A | Discharge status: Home / Self Care | Payer: Medicaid Other | Attending: Emergency Medicine | Admitting: Emergency Medicine

## 2016-04-27 DIAGNOSIS — K08409 Partial loss of teeth, unspecified cause, unspecified class: Secondary | ICD-10-CM

## 2016-04-27 DIAGNOSIS — R51 Headache: Secondary | ICD-10-CM | POA: Diagnosis not present

## 2016-04-27 DIAGNOSIS — K068 Other specified disorders of gingiva and edentulous alveolar ridge: Secondary | ICD-10-CM

## 2016-04-27 DIAGNOSIS — R6884 Jaw pain: Secondary | ICD-10-CM | POA: Diagnosis not present

## 2016-04-27 DIAGNOSIS — R519 Headache, unspecified: Secondary | ICD-10-CM

## 2016-04-27 MED ORDER — PREDNISONE 20 MG PO TABS: ORAL_TABLET | ORAL | Status: DC

## 2016-04-27 MED ORDER — CLINDAMYCIN HCL 150 MG PO CAPS: 150.0000 mg | ORAL_CAPSULE | Freq: Four times a day (QID) | ORAL | Status: DC

## 2016-04-27 NOTE — ED Provider Notes (Signed)
CSN: [id number]     Arrival date & time 04/27/16  1256 History   First MD Initiated Contact with Patient 04/27/16 1313     Chief Complaint  Patient presents with  . Dental Pain   (Consider location/radiation/quality/duration/timing/severity/associated sxs/prior Treatment) HPI Penny [name] is a 35 y.o. female presenting to UC with c/o Left sided facial pain that started last week after having 2 teeth pulled. She notes she was seen by her dentist yesterday who diagnosed her with a dry socket and noted she has a tooth segment still in there. The socket was cleaned out yesterday, she completed her last dose of amoxicillin today but still c/o aching throbbing pain and swelling. Pain is moderate to severe.  She has taken 800mg  Motrin and Vicodin w/o relief.  She notes she had 4 teeth pulled previously and that pain only lasted 3 days. Denies fever, chills, n/v/d. Denies difficulty breathing or swallowing, only chewing.     Past Medical History  Diagnosis Date  . Preterm labor     c/s at 36 wks  . Placenta previa     hx and with current pregnancy  . Migraines     last one over 1 month ago  . Anxiety     no meds  . Depression     no meds  . ADHD (attention deficit hyperactivity disorder)   . Scoliosis    Past Surgical History  Procedure Laterality Date  . Cesarean section  2010    x 1 at 36 wks in [place]  . Svd  2003    x 1 in [place]  . Wisdom tooth extraction    . Tubal ligation    . Cesarean section  February 13, 2012  . Laparoscopy N/A 10/26/2015    Procedure: LAPAROSCOPY OPERATIVE WITH REMOVAL OF MISPLACED FILSHE CLIP;  Surgeon: Crawford Givens, MD;  Location: [facility];  Service: Gynecology;  Laterality: N/A;  . Laparoscopic lysis of adhesions  10/26/2015    Procedure: LAPAROSCOPIC LYSIS OF ADHESIONS;  Surgeon: Crawford Givens, MD;  Location: [facility];  Service: Gynecology;;  . Chromopertubation  10/26/2015    Procedure: ATTEMPTED CHROMOPERTUBATION;  Surgeon: Crawford Givens, MD;   Location: [facility];  Service: Gynecology;;   Family History  Problem Relation Age of Onset  . Cancer Mother   . Hypertension Sister   . Sickle cell trait Daughter   . Asthma Son   . Asthma Paternal Grandmother   . Pseudochol deficiency Neg Hx   . Malignant hyperthermia Neg Hx   . Anesthesia problems Neg Hx   . Hypotension Neg Hx    Social History  Substance Use Topics  . Smoking status: Current Every Day Smoker -- 0.10 packs/day for 16 years    Types: Cigarettes, E-cigarettes  . Smokeless tobacco: Never Used  . Alcohol Use: 0.0 oz/week    0 Standard drinks or equivalent per week     Comment: on occ   OB History    Gravida Para Term Preterm AB TAB SAB Ectopic Multiple Living   3 3 1 2  0 0 0 0 0 3     Review of Systems  Constitutional: Negative for fever and chills.  HENT: Positive for dental problem ( Left side dental pain), ear pain (Left) and facial swelling ( Left). Negative for sore throat.   Gastrointestinal: Negative for nausea, vomiting and diarrhea.  Musculoskeletal: Negative for neck pain and neck stiffness.    Allergies  Review of patient's allergies indicates no known allergies.  Home Medications   Prior to Admission medications   Medication Sig Start Date End Date Taking? Authorizing Provider  ibuprofen (ADVIL,MOTRIN) 800 MG tablet Take 1 tablet (800 mg total) by mouth 3 (three) times daily. 04/14/16  Yes Kayla Rose, PA-C  clindamycin (CLEOCIN) 150 MG capsule Take 1 capsule (150 mg total) by mouth every 6 (six) hours. 04/27/16   Noland Fordyce, PA-C  predniSONE (DELTASONE) 20 MG tablet 3 tabs po day one, then 2 po daily x 4 days 04/27/16   Noland Fordyce, PA-C   Meds Ordered and Administered this Visit  Medications - No data to display  BP 117/78 mmHg  Pulse 93  Temp(Src) 98.1 F (36.7 C) (Oral)  Resp 16  SpO2 97%  LMP 04/24/2016 No data found.   Physical Exam  Constitutional: She is oriented to person, place, and time. She appears well-developed and  well-nourished.  HENT:  Head: Normocephalic and atraumatic.  Right Ear: Tympanic membrane normal.  Left Ear: Tympanic membrane normal.  Nose: Right sinus exhibits no maxillary sinus tenderness and no frontal sinus tenderness. Left sinus exhibits maxillary sinus tenderness. Left sinus exhibits no frontal sinus tenderness.  Mouth/Throat: Uvula is midline, oropharynx is clear and moist and mucous membranes are normal. Abnormal dentition.    Mild gingival erythema and edema around 2 sockets from recent tooth removal, small amount of red blood and white-yellow discharge from sockets.  Mild edema with tenderness to Left side of face over maxillary sinus and Left lower jaw w/o erythema or warmth to skin.  Eyes: EOM are normal.  Neck: Normal range of motion.  Cardiovascular: Normal rate.   Pulmonary/Chest: Effort normal.  Musculoskeletal: Normal range of motion.  Neurological: She is alert and oriented to person, place, and time.  Skin: Skin is warm and dry.  Psychiatric: She has a normal mood and affect. Her behavior is normal.  Nursing note and vitals reviewed.   ED Course  Procedures (including critical care time)  Labs Review Labs Reviewed - No data to display  Imaging Review No results found.    MDM   1. Left-sided face pain   2. Jaw pain   3. Pain in gums   4. S/P tooth extraction, unspecified edentulism    Pt c/o continued tooth pain 8 days after getting 2 teeth pulled. Pt was dx with a dry socket by her surgeon but states pain persists despite having socket rinsed yesterday.  Will place on another course of antibiotics due to continued swelling of face and gingiva as well as prednisone to help with inflammation.  Rx: clindamycin and prednisone Encouraged warm saltwater rinses.   Encouraged to f/u with her surgeon for more narcotic pain medications as additional narcotics will not be prescribed today in UC.    Noland Fordyce, PA-C 04/27/16 1355

## 2016-04-27 NOTE — ED Notes (Signed)
PT had two teeth pulled last week and a piece of tooth remained in the socket. PT has pain over left side of face. PT saw a dentist yesterday and he did not take out remaining tooth segment. PT has taken Motrin 800 mg and Vicodin with no relief. PT took a full course of antibiotics as well.

## 2016-04-27 NOTE — Discharge Instructions (Signed)
Be sure to stick with a soft diet until jaw and gum pain improves. It may be soothing to swish and gargle warm salt water to help with pain and swelling. Please follow up with your dental surgeon next week if still not improving.

## 2016-05-22 ENCOUNTER — Encounter (HOSPITAL_COMMUNITY): Payer: Self-pay | Admitting: *Deleted

## 2016-05-22 ENCOUNTER — Emergency Department (HOSPITAL_COMMUNITY): Payer: Medicaid Other

## 2016-05-22 ENCOUNTER — Emergency Department (HOSPITAL_COMMUNITY)
Admission: EM | Admit: 2016-05-22 | Discharge: 2016-05-22 | Disposition: A | Discharge status: Home / Self Care | Payer: Medicaid Other | Attending: Emergency Medicine | Admitting: Emergency Medicine

## 2016-05-22 DIAGNOSIS — Z79899 Other long term (current) drug therapy: Secondary | ICD-10-CM | POA: Insufficient documentation

## 2016-05-22 DIAGNOSIS — R0602 Shortness of breath: Secondary | ICD-10-CM | POA: Insufficient documentation

## 2016-05-22 DIAGNOSIS — R0789 Other chest pain: Secondary | ICD-10-CM

## 2016-05-22 DIAGNOSIS — F17210 Nicotine dependence, cigarettes, uncomplicated: Secondary | ICD-10-CM | POA: Insufficient documentation

## 2016-05-22 LAB — CBC
HCT: 41 % (ref 36.0–46.0)
Hemoglobin: 14 g/dL (ref 12.0–15.0)
MCH: 28.5 pg (ref 26.0–34.0)
MCHC: 34.1 g/dL (ref 30.0–36.0)
MCV: 83.3 fL (ref 78.0–100.0)
Platelets: 343 10*3/uL (ref 150–400)
RBC: 4.92 MIL/uL (ref 3.87–5.11)
RDW: 13.8 % (ref 11.5–15.5)
WBC: 10.6 10*3/uL — ABNORMAL HIGH (ref 4.0–10.5)

## 2016-05-22 LAB — BASIC METABOLIC PANEL
Anion gap: 8 (ref 5–15)
BUN: 5 mg/dL — ABNORMAL LOW (ref 6–20)
CO2: 21 mmol/L — ABNORMAL LOW (ref 22–32)
Calcium: 8.9 mg/dL (ref 8.9–10.3)
Chloride: 106 mmol/L (ref 101–111)
Creatinine, Ser: 0.61 mg/dL (ref 0.44–1.00)
GFR calc Af Amer: >60 mL/min (ref >60)
GFR calc non Af Amer: >60 mL/min (ref >60)
Glucose, Bld: 90 mg/dL (ref 65–99)
Potassium: 3.2 mmol/L — ABNORMAL LOW (ref 3.5–5.1)
Sodium: 135 mmol/L (ref 135–145)

## 2016-05-22 LAB — I-STAT TROPONIN, ED: Troponin i, poc: 0 ng/mL (ref 0.00–0.08)

## 2016-05-22 LAB — D-DIMER, QUANTITATIVE: D-Dimer, Quant: <0.27 ug/mL-FEU (ref 0.00–0.50)

## 2016-05-22 LAB — BRAIN NATRIURETIC PEPTIDE: B Natriuretic Peptide: 18.7 pg/mL (ref 0.0–100.0)

## 2016-05-22 MED ORDER — POTASSIUM CHLORIDE CRYS ER 20 MEQ PO TBCR
40.0000 meq | EXTENDED_RELEASE_TABLET | Freq: Once | ORAL | Status: AC
  Administered 2016-05-22: 40 meq via ORAL
  Filled 2016-05-22: qty 2

## 2016-05-22 MED ORDER — IBUPROFEN 200 MG PO TABS
400.0000 mg | ORAL_TABLET | Freq: Once | ORAL | Status: AC
  Administered 2016-05-22: 400 mg via ORAL
  Filled 2016-05-22: qty 2

## 2016-05-22 NOTE — Discharge Instructions (Signed)
Nonspecific Chest Pain Call the [facility] in [place] to get a primary care physician. They have walk-in hours without an appointment needed on Thursdays. See them if you are not feeling better by Thursday, 05/26/2016. Return if concerned for any reason. Take Advil as directed for pain. It is often hard to find the cause of chest pain. There is always a chance that your pain could be related to something serious, such as a heart attack or a blood clot in your lungs. Chest pain can also be caused by conditions that are not life-threatening. If you have chest pain, it is very important to follow up with your doctor.  HOME CARE  If you were prescribed an antibiotic medicine, finish it all even if you start to feel better.  Avoid any activities that cause chest pain.  Do not use any tobacco products, including cigarettes, chewing tobacco, or electronic cigarettes. If you need help quitting, ask your doctor.  Do not drink alcohol.  Take medicines only as told by your doctor.  Keep all follow-up visits as told by your doctor. This is important. This includes any further testing if your chest pain does not go away.  Your doctor may tell you to keep your head raised (elevated) while you sleep.  Make lifestyle changes as told by your doctor. These may include:  Getting regular exercise. Ask your doctor to suggest some activities that are safe for you.  Eating a heart-healthy diet. Your doctor or a diet specialist (dietitian) can help you to learn healthy eating options.  Maintaining a healthy weight.  Managing diabetes, if necessary.  Reducing stress. GET HELP IF:  Your chest pain does not go away, even after treatment.  You have a rash with blisters on your chest.  You have a fever. GET HELP RIGHT AWAY IF:  Your chest pain is worse.  You have an increasing cough, or you cough up blood.  You have severe belly (abdominal) pain.  You feel extremely weak.  You  pass out (faint).  You have chills.  You have sudden, unexplained chest discomfort.  You have sudden, unexplained discomfort in your arms, back, neck, or jaw.  You have shortness of breath at any time.  You suddenly start to sweat, or your skin gets clammy.  You feel nauseous.  You vomit.  You suddenly feel light-headed or dizzy.  Your heart begins to beat quickly, or it feels like it is skipping beats. These symptoms may be an emergency. Do not wait to see if the symptoms will go away. Get medical help right away. Call your local emergency services (911 in the [place] Do not drive yourself to the hospital.   This information is not intended to replace advice given to you by your health care provider. Make sure you discuss any questions you have with your health care provider.   Document Released: 04/18/2008 Document Revised: 11/21/2014 Document Reviewed: 06/06/2014 Elsevier Interactive Patient Education [employer].

## 2016-05-22 NOTE — ED Provider Notes (Signed)
CSN: [id number]     Arrival date & time 05/22/16  2051 History   First MD Initiated Contact with Patient 05/22/16 2106     Chief Complaint  Patient presents with  . Chest Pain     (Consider location/radiation/quality/duration/timing/severity/associated sxs/prior Treatment) HPI Plan of left anterior chest pain radiating to left back and to left arm onset 6 PM yesterday lasted 5 minutes, resolve spontaneously. Had a second episode onset 3 PM today gradual onset pain is pleuritic worse with deep inspiration and worse with lying supine improved with sitting up. Other associated symptoms include shortness of breath when lying supine. EMS treated patient with one sublingual nitroglycerin tablet and with aspirin 324 mg with minimal relief pain she also states minimal cough. No fever. no other associated symptoms. Pain is not made worse with exertion. Past Medical History  Diagnosis Date  . Preterm labor     c/s at 36 wks  . Placenta previa     hx and with current pregnancy  . Migraines     last one over 1 month ago  . Anxiety     no meds  . Depression     no meds  . ADHD (attention deficit hyperactivity disorder)   . Scoliosis    Past Surgical History  Procedure Laterality Date  . Cesarean section  2010    x 1 at 36 wks in [place]  . Svd  2003    x 1 in [place]  . Wisdom tooth extraction    . Tubal ligation    . Cesarean section  February 13, 2012  . Laparoscopy N/A 10/26/2015    Procedure: LAPAROSCOPY OPERATIVE WITH REMOVAL OF MISPLACED FILSHE CLIP;  Surgeon: Crawford Givens, MD;  Location: [facility];  Service: Gynecology;  Laterality: N/A;  . Laparoscopic lysis of adhesions  10/26/2015    Procedure: LAPAROSCOPIC LYSIS OF ADHESIONS;  Surgeon: Crawford Givens, MD;  Location: [facility];  Service: Gynecology;;  . Chromopertubation  10/26/2015    Procedure: ATTEMPTED CHROMOPERTUBATION;  Surgeon: Crawford Givens, MD;  Location: [facility];  Service: Gynecology;;   Family History  Problem Relation Age of  Onset  . Cancer Mother   . Hypertension Sister   . Sickle cell trait Daughter   . Asthma Son   . Asthma Paternal Grandmother   . Pseudochol deficiency Neg Hx   . Malignant hyperthermia Neg Hx   . Anesthesia problems Neg Hx   . Hypotension Neg Hx    Social History  Substance Use Topics  . Smoking status: Current Every Day Smoker -- 0.10 packs/day for 16 years    Types: Cigarettes, E-cigarettes  . Smokeless tobacco: Never Used  . Alcohol Use: 0.0 oz/week    0 Standard drinks or equivalent per week     Comment: on occ   OB History    Gravida Para Term Preterm AB TAB SAB Ectopic Multiple Living   3 3 1 2  0 0 0 0 0 3     Review of Systems  Constitutional: Negative.   HENT: Negative.   Respiratory: Positive for cough and shortness of breath.   Cardiovascular: Positive for chest pain.  Gastrointestinal: Negative.   Musculoskeletal: Negative.   Skin: Negative.   Neurological: Negative.   Psychiatric/Behavioral: Negative.   All other systems reviewed and are negative.     Allergies  Review of patient's allergies indicates no known allergies.  Home Medications   Prior to Admission medications   Medication Sig Start Date End Date Taking? Authorizing  Provider  clindamycin (CLEOCIN) 150 MG capsule Take 1 capsule (150 mg total) by mouth every 6 (six) hours. 04/27/16   Noland Fordyce, PA-C  ibuprofen (ADVIL,MOTRIN) 800 MG tablet Take 1 tablet (800 mg total) by mouth 3 (three) times daily. 04/14/16   Gloriann Loan, PA-C  predniSONE (DELTASONE) 20 MG tablet 3 tabs po day one, then 2 po daily x 4 days 04/27/16   Noland Fordyce, PA-C   BP 139/91 mmHg  Pulse 79  Temp(Src) 98.1 F (36.7 C) (Oral)  Resp 16  Ht 5\' 4"  (1.626 m)  Wt 188 lb (85.276 kg)  BMI 32.25 kg/m2  SpO2 99%  LMP 04/24/2016 Physical Exam  Constitutional: She is oriented to person, place, and time. She appears well-developed and well-nourished.  Mildly anxious appearing  HENT:  Head: Normocephalic and atraumatic.   Eyes: Conjunctivae are normal. Pupils are equal, round, and reactive to light.  Neck: Neck supple. No JVD present. No tracheal deviation present. No thyromegaly present.  Cardiovascular: Normal rate and regular rhythm.   No murmur heard. Pulmonary/Chest: Effort normal and breath sounds normal.  Abdominal: Soft. Bowel sounds are normal. She exhibits no distension. There is no tenderness.  Musculoskeletal: Normal range of motion. She exhibits no edema or tenderness.  Neurological: She is alert and oriented to person, place, and time. Coordination normal.  Skin: Skin is warm and dry. No rash noted.  Psychiatric: She has a normal mood and affect.  Nursing note and vitals reviewed.   ED Course  Procedures (including critical care time) Labs Review Labs Reviewed  BASIC METABOLIC PANEL  [place], ED    Imaging Review No results found. I have personally reviewed and evaluated these images and lab results as part of my medical decision-making.   EKG Interpretation   Date/Time:  Sunday May 22 2016 21:18:15 EDT Ventricular Rate:  80 PR Interval:    QRS Duration: 92 QT Interval:  397 QTC Calculation: 458 R Axis:   68 Text Interpretation:  Sinus rhythm ST elev, probable normal early repol  pattern No significant change since last tracing Confirmed by Winfred Leeds   MD, Neva Ramaswamy [phone]) on 05/22/2016 9:22:12 PM     Chest x-ray viewed by me Results for orders placed or performed during the hospital encounter of 0000000  Basic metabolic panel  Result Value Ref Range   Sodium 135 135 - 145 mmol/L   Potassium 3.2 (L) 3.5 - 5.1 mmol/L   Chloride 106 101 - 111 mmol/L   CO2 21 (L) 22 - 32 mmol/L   Glucose, Bld 90 65 - 99 mg/dL   BUN 5 (L) 6 - 20 mg/dL   Creatinine, Ser 0.61 0.44 - 1.00 mg/dL   Calcium 8.9 8.9 - 10.3 mg/dL   GFR calc non Af Amer >60 >60 mL/min   GFR calc Af Amer >60 >60 mL/min   Anion gap 8 5 - 15  CBC  Result Value Ref Range   WBC 10.6 (H) 4.0 - 10.5 K/uL    RBC 4.92 3.87 - 5.11 MIL/uL   Hemoglobin 14.0 12.0 - 15.0 g/dL   HCT 41.0 36.0 - 46.0 %   MCV 83.3 78.0 - 100.0 fL   MCH 28.5 26.0 - 34.0 pg   MCHC 34.1 30.0 - 36.0 g/dL   RDW 13.8 11.5 - 15.5 %   Platelets 343 150 - 400 K/uL  D-dimer, quantitative (not at [facility])  Result Value Ref Range   D-Dimer, Quant <0.27 0.00 - 0.50 ug/mL-FEU  Brain natriuretic peptide  Result Value Ref Range   B Natriuretic Peptide 18.7 0.0 - 100.0 pg/mL  I-stat troponin, ED  Result Value Ref Range   Troponin i, poc 0.00 0.00 - 0.08 ng/mL   Comment 3           Dg Chest 2 View  05/22/2016  CLINICAL DATA:  Substernal chest pain, shortness of breath EXAM: CHEST  2 VIEW COMPARISON:  01/31/2006 T FINDINGS: Lungs are clear.  No pleural effusion or pneumothorax. The heart is normal in size. Mid thoracic dextroscoliosis. IMPRESSION: No evidence of acute cardiopulmonary disease. Electronically Signed   By: Julian Hy M.D.   On: 05/22/2016 21:45    MDM  Heart score equals 1. NonspecificEKGfindings psychotic to facet spaces between my words which are chronic.Oh pretest clinical probability for pulmonary embolism. D-dimer is normal. Pericarditis a remote possibility however no rubs no murmurs no EKG evidence. Plan ibuprofen for pain. She'll get 1 dose prior to discharge. Referral [facility] in [place]. Serial troponins not indicated. Continuous symptoms for 6 hours Diagnosis atypical chest pain  Final diagnoses:  None        Orlie Dakin, MD 05/22/16 2235

## 2016-05-22 NOTE — ED Notes (Signed)
Pt arrives via EMS from home. C/o chest pain x 2 days, cramping and radiates to left arm. Pain relieved by pressure to chest and worse on deep breath. 324 ASA, 1 NTG. Pain went from 5 to 4 after NTG. Also c/o non-productive cough.

## 2016-05-31 ENCOUNTER — Emergency Department (HOSPITAL_COMMUNITY)
Admission: EM | Admit: 2016-05-31 | Discharge: 2016-05-31 | Disposition: A | Discharge status: Home / Self Care | Payer: Medicaid Other | Attending: Emergency Medicine | Admitting: Emergency Medicine

## 2016-05-31 ENCOUNTER — Emergency Department (HOSPITAL_COMMUNITY)
Admission: EM | Admit: 2016-05-31 | Discharge: 2016-06-01 | Discharge status: Against Medical Advice | Payer: Medicaid Other | Source: Home / Self Care | Attending: Emergency Medicine | Admitting: Emergency Medicine

## 2016-05-31 ENCOUNTER — Encounter (HOSPITAL_COMMUNITY): Payer: Self-pay | Admitting: Oncology

## 2016-05-31 ENCOUNTER — Encounter (HOSPITAL_COMMUNITY): Payer: Self-pay

## 2016-05-31 DIAGNOSIS — F419 Anxiety disorder, unspecified: Secondary | ICD-10-CM

## 2016-05-31 DIAGNOSIS — T781XXA Other adverse food reactions, not elsewhere classified, initial encounter: Secondary | ICD-10-CM | POA: Diagnosis present

## 2016-05-31 DIAGNOSIS — F17210 Nicotine dependence, cigarettes, uncomplicated: Secondary | ICD-10-CM | POA: Diagnosis not present

## 2016-05-31 DIAGNOSIS — T7840XA Allergy, unspecified, initial encounter: Secondary | ICD-10-CM

## 2016-05-31 MED ORDER — EPINEPHRINE 0.3 MG/0.3ML IJ SOAJ: 0.3000 mg | Freq: Once | INTRAMUSCULAR | Status: DC

## 2016-05-31 MED ORDER — LORAZEPAM 1 MG PO TABS
1.0000 mg | ORAL_TABLET | Freq: Once | ORAL | Status: AC
  Administered 2016-05-31: 1 mg via ORAL
  Filled 2016-05-31: qty 1

## 2016-05-31 MED ORDER — METHYLPREDNISOLONE SODIUM SUCC 125 MG IJ SOLR
125.0000 mg | Freq: Once | INTRAMUSCULAR | Status: AC
  Administered 2016-05-31: 125 mg via INTRAVENOUS
  Filled 2016-05-31: qty 2

## 2016-05-31 MED ORDER — FAMOTIDINE IN NACL 20-0.9 MG/50ML-% IV SOLN
20.0000 mg | Freq: Once | INTRAVENOUS | Status: AC
  Administered 2016-05-31: 20 mg via INTRAVENOUS
  Filled 2016-05-31: qty 50

## 2016-05-31 NOTE — Discharge Instructions (Signed)
Take Benadryl symptoms return. Also prescription for EpiPen for serious reaction.

## 2016-05-31 NOTE — ED Notes (Signed)
Pt in from home via [facility], per report pt reports feeling she is having an allergic reaction to shrimp last night, pt reports that she did not eat the shrimp last night but her husband had it, pt reports hx of the same reaction with shrimp in the past, pt has itching to the lower lip & reports sensation of swelling with no obvious swelling, pt rcvd 50 mg IV Benadryl pta, pt denies SOB, speaks in complete sentences, A&O x4

## 2016-05-31 NOTE — ED Provider Notes (Signed)
CSN: [id number]     Arrival date & time 05/31/16  2234 History  By signing my name below, I, [name], attest that this documentation has been prepared under the direction and in the presence of Junius Creamer, NP. Electronically Signed: [name], ED Scribe. 05/31/2016. 11:02 PM.     Chief Complaint  Patient presents with  . Anxiety   Patient is a 35 y.o. female presenting with anxiety. The history is provided by the patient. No language interpreter was used.  Anxiety Associated symptoms include headaches.   HPI Comments: Penny [name] is a 35 y.o. female who presents to the Emergency Department complaining of developing a HA onset tonight after eating a baked hamburger and onion rings. She states that after eating the hamburger she developed numbness in her legs above the knees that lasted for about 30 minutes. She states that now the pain has moved to below her knees and she describes it as tingling. Pt states that she is able to ambulate without difficulty but the tingling is worse. She reports she has Hx of anxiety. Pt reports that she was recently scene this morning in [facility] ED for an allergic reaction to shrimp. She states she received steroids and an epi pen prescription. Denies nausea, vomiting, CP or SOB. LMP 1 week prior.   Past Medical History  Diagnosis Date  . Preterm labor     c/s at 36 wks  . Placenta previa     hx and with current pregnancy  . Migraines     last one over 1 month ago  . Anxiety     no meds  . Depression     no meds  . ADHD (attention deficit hyperactivity disorder)   . Scoliosis    Past Surgical History  Procedure Laterality Date  . Cesarean section  2010    x 1 at 36 wks in [place]  . Svd  2003    x 1 in [place]  . Wisdom tooth extraction    . Tubal ligation    . Cesarean section  February 13, 2012  . Laparoscopy N/A 10/26/2015    Procedure: LAPAROSCOPY OPERATIVE WITH REMOVAL OF MISPLACED FILSHE CLIP;  Surgeon: Crawford Givens, MD;   Location: [facility];  Service: Gynecology;  Laterality: N/A;  . Laparoscopic lysis of adhesions  10/26/2015    Procedure: LAPAROSCOPIC LYSIS OF ADHESIONS;  Surgeon: Crawford Givens, MD;  Location: [facility];  Service: Gynecology;;  . Chromopertubation  10/26/2015    Procedure: ATTEMPTED CHROMOPERTUBATION;  Surgeon: Crawford Givens, MD;  Location: [facility];  Service: Gynecology;;   Family History  Problem Relation Age of Onset  . Cancer Mother   . Hypertension Sister   . Sickle cell trait Daughter   . Asthma Son   . Asthma Paternal Grandmother   . Pseudochol deficiency Neg Hx   . Malignant hyperthermia Neg Hx   . Anesthesia problems Neg Hx   . Hypotension Neg Hx    Social History  Substance Use Topics  . Smoking status: Current Every Day Smoker -- 0.10 packs/day for 16 years    Types: Cigarettes, E-cigarettes  . Smokeless tobacco: Never Used  . Alcohol Use: 0.0 oz/week    0 Standard drinks or equivalent per week     Comment: on occ   OB History    Gravida Para Term Preterm AB TAB SAB Ectopic Multiple Living   3 3 1 2  0 0 0 0 0 3     Review of  Systems  Neurological: Positive for headaches.  Psychiatric/Behavioral: The patient is nervous/anxious.   All other systems reviewed and are negative.     Allergies  Shrimp  Home Medications   Prior to Admission medications   Medication Sig Start Date End Date Taking? Authorizing Provider  diphenhydrAMINE (BENADRYL) 25 MG tablet Take 50 mg by mouth every 6 (six) hours as needed for allergies.   Yes Historical Provider, MD  EPINEPHrine 0.3 mg/0.3 mL IJ SOAJ injection Inject 0.3 mLs (0.3 mg total) into the muscle once. 05/31/16  Yes Nat Christen, MD   BP 132/86 mmHg  Pulse 108  Temp(Src) 98.2 F (36.8 C) (Oral)  Resp 20  SpO2 100%  LMP 05/23/2016 (Approximate)   Physical Exam  Constitutional: She is oriented to person, place, and time. She appears well-developed and well-nourished. No distress.  HENT:  Head: Normocephalic and  atraumatic.  Eyes: Conjunctivae and EOM are normal.  Neck: Neck supple. No tracheal deviation present.  Cardiovascular: Normal rate.   Pulmonary/Chest: Effort normal. No respiratory distress.  Musculoskeletal: Normal range of motion.  Neurological: She is alert and oriented to person, place, and time.  Skin: Skin is warm and dry.  Psychiatric: She has a normal mood and affect. Her behavior is normal.  Nursing note and vitals reviewed.   ED Course  Procedures (including critical care time) DIAGNOSTIC STUDIES: Oxygen Saturation is 97% on RA, normal by my interpretation.    COORDINATION OF CARE: 11:01 PM-Discussed treatment plan which includes ativan with pt at bedside and pt agreed to plan.     Labs Review Labs Reviewed  CBC WITH DIFFERENTIAL/PLATELET  I-STAT CHEM 8, ED    Imaging Review No results found.    EKG Interpretation None     Patient was initially given Ativan.  I felt this was mostly anxiety. On reevaluation, the patient states that she still does not feel right.  I've asked the nurse to start an IV and obtained CBC i-STAT and give her a liter fluid.  Patient has refused this. Stating she just wants to go home. MDM   Final diagnoses:  Anxiety      I personally performed the services described in this documentation, which was scribed in my presence. The recorded information has been reviewed and is accurate.     Junius Creamer, NP 06/01/16 [id number]   Virgel Manifold, MD 06/11/16 2143

## 2016-05-31 NOTE — ED Notes (Signed)
Per EMS pt had dinner at 1900, shortly after developed a HA.  At [place] pt reports to EMS that she developed numbness and tingling to b/l LE.  Pt ambulatory to room from ambulance.

## 2016-05-31 NOTE — ED Notes (Signed)
Bed: [id number] Expected date:  Expected time:  Means of arrival:  Comments: EMS fall / leg injury

## 2016-05-31 NOTE — ED Provider Notes (Signed)
CSN: [id number]     Arrival date & time 05/31/16  [id number] History   First MD Initiated Contact with Patient 05/31/16 0745     Chief Complaint  Patient presents with  . Allergic Reaction     (Consider location/radiation/quality/duration/timing/severity/associated sxs/prior Treatment) HPI.Marland KitchenMarland KitchenMarland KitchenUpper lip swelling and chest tightness after exposure to shrimp approximately 3 AM today. Patient has had allergies to shrimp in the past. No frank dyspnea, difficulty swallowing, pruritic rash. She took Benadryl at home.  Patient was transported by EMS and intravenous Benadryl was given. She is feeling better now.  Past Medical History  Diagnosis Date  . Preterm labor     c/s at 36 wks  . Placenta previa     hx and with current pregnancy  . Migraines     last one over 1 month ago  . Anxiety     no meds  . Depression     no meds  . ADHD (attention deficit hyperactivity disorder)   . Scoliosis    Past Surgical History  Procedure Laterality Date  . Cesarean section  2010    x 1 at 36 wks in [place]  . Svd  2003    x 1 in [place]  . Wisdom tooth extraction    . Tubal ligation    . Cesarean section  February 13, 2012  . Laparoscopy N/A 10/26/2015    Procedure: LAPAROSCOPY OPERATIVE WITH REMOVAL OF MISPLACED FILSHE CLIP;  Surgeon: Crawford Givens, MD;  Location: [facility];  Service: Gynecology;  Laterality: N/A;  . Laparoscopic lysis of adhesions  10/26/2015    Procedure: LAPAROSCOPIC LYSIS OF ADHESIONS;  Surgeon: Crawford Givens, MD;  Location: [facility];  Service: Gynecology;;  . Chromopertubation  10/26/2015    Procedure: ATTEMPTED CHROMOPERTUBATION;  Surgeon: Crawford Givens, MD;  Location: [facility];  Service: Gynecology;;   Family History  Problem Relation Age of Onset  . Cancer Mother   . Hypertension Sister   . Sickle cell trait Daughter   . Asthma Son   . Asthma Paternal Grandmother   . Pseudochol deficiency Neg Hx   . Malignant hyperthermia Neg Hx   . Anesthesia problems Neg Hx   . Hypotension Neg  Hx    Social History  Substance Use Topics  . Smoking status: Current Every Day Smoker -- 0.10 packs/day for 16 years    Types: Cigarettes, E-cigarettes  . Smokeless tobacco: Never Used  . Alcohol Use: 0.0 oz/week    0 Standard drinks or equivalent per week     Comment: on occ   OB History    Gravida Para Term Preterm AB TAB SAB Ectopic Multiple Living   3 3 1 2  0 0 0 0 0 3     Review of Systems  All other systems reviewed and are negative.     Allergies  Shrimp  Home Medications   Prior to Admission medications   Medication Sig Start Date End Date Taking? Authorizing Provider  diphenhydrAMINE (BENADRYL) 25 MG tablet Take 50 mg by mouth every 6 (six) hours as needed for allergies.   Yes Historical Provider, MD  EPINEPHrine 0.3 mg/0.3 mL IJ SOAJ injection Inject 0.3 mLs (0.3 mg total) into the muscle once. 05/31/16   Nat Christen, MD   BP 148/102 mmHg  Pulse 80  Temp(Src) 98.2 F (36.8 C) (Oral)  Resp 20  Ht 5\' 4"  (1.626 m)  Wt 189 lb (85.73 kg)  BMI 32.43 kg/m2  SpO2 99% Physical Exam  Constitutional: She is oriented  to person, place, and time. She appears well-developed and well-nourished.  No respiratory distress  HENT:  Head: Normocephalic and atraumatic.  Minimal upper lip swelling  Eyes: Conjunctivae are normal.  Neck: Neck supple.  Cardiovascular: Normal rate and regular rhythm.   Pulmonary/Chest: Effort normal and breath sounds normal.  Abdominal: Soft. Bowel sounds are normal.  Musculoskeletal: Normal range of motion.  Neurological: She is alert and oriented to person, place, and time.  Skin: Skin is warm and dry.  Psychiatric: She has a normal mood and affect. Her behavior is normal.  Nursing note and vitals reviewed.   ED Course  Procedures (including critical care time) Labs Review Labs Reviewed - No data to display  Imaging Review No results found. I have personally reviewed and evaluated these images and lab results as part of my medical  decision-making.   EKG Interpretation None      MDM   Final diagnoses:  Allergic reaction, initial encounter    Patient feels much better after IV Pepcid and IV steroids. Discharge medication EpiPen.    Nat Christen, MD 05/31/16 [phone]

## 2016-06-01 MED ORDER — KETOROLAC TROMETHAMINE 30 MG/ML IJ SOLN: 30.0000 mg | Freq: Once | INTRAMUSCULAR | Status: DC

## 2016-06-01 MED ORDER — SODIUM CHLORIDE 0.9 % IV BOLUS (SEPSIS): 1000.0000 mL | Freq: Once | INTRAVENOUS | Status: DC

## 2016-06-01 NOTE — Discharge Instructions (Signed)
Panic Attacks °Panic attacks are sudden, short feelings of great fear or discomfort. You may have them for no reason when you are relaxed, when you are uneasy (anxious), or when you are sleeping.  °HOME CARE °· Take all your medicines as told. °· Check with your doctor before starting new medicines. °· Keep all doctor visits. °GET HELP IF: °· You are not able to take your medicines as told. °· Your symptoms do not get better. °· Your symptoms get worse. °GET HELP RIGHT AWAY IF: °· Your attacks seem different than your normal attacks. °· You have thoughts about hurting yourself or others. °· You take panic attack medicine and you have a side effect. °MAKE SURE YOU: °· Understand these instructions. °· Will watch your condition. °· Will get help right away if you are not doing well or get worse. °  °This information is not intended to replace advice given to you by your health care provider. Make sure you discuss any questions you have with your health care provider. °  °Document Released: 12/03/2010 Document Revised: 08/21/2013 Document Reviewed: 06/14/2013 °Elsevier Interactive Patient Education ©2016 [employer] ° °

## 2016-06-01 NOTE — ED Notes (Signed)
Pt is demanding to leave AMA at this time.  Baker Janus, NP placed orders after pt voiced concerns of having cardiomyopathy.  When this writer entered room to carry out orders pt became verbally aggressive asking, "Why wasn't this done from the start?"  Explained to pt that standard practice is to go from least invasive to most invasive and that pt had presented w/ classic sx associated w/ a panic attack and was tx accordingly.  Once pt voiced concern about cardiomyopathy additional steps were then taken to evaluate pt's condition.  Pt stated, "I'm too frustrated, I'm leaving."  Explained the risks associated w/ leaving AMA, pt verbalized understanding continued to sign AMA.

## 2016-06-16 ENCOUNTER — Emergency Department (HOSPITAL_COMMUNITY): Admission: EM | Admit: 2016-06-16 | Payer: Medicaid Other | Source: Home / Self Care

## 2016-06-16 ENCOUNTER — Ambulatory Visit (HOSPITAL_COMMUNITY)
Admission: RE | Admit: 2016-06-16 | Discharge: 2016-06-16 | Disposition: A | Discharge status: Home / Self Care | Payer: Medicaid Other | Source: Ambulatory Visit | Attending: Anesthesiology | Admitting: Anesthesiology

## 2016-06-16 ENCOUNTER — Emergency Department (HOSPITAL_COMMUNITY): Payer: Medicaid Other

## 2016-06-16 ENCOUNTER — Encounter (HOSPITAL_COMMUNITY): Payer: Self-pay

## 2016-06-16 ENCOUNTER — Other Ambulatory Visit (HOSPITAL_COMMUNITY): Payer: Self-pay | Admitting: Anesthesiology

## 2016-06-16 DIAGNOSIS — M545 Low back pain: Secondary | ICD-10-CM

## 2016-06-17 ENCOUNTER — Other Ambulatory Visit (HOSPITAL_COMMUNITY): Payer: Self-pay | Admitting: Anesthesiology

## 2016-06-27 ENCOUNTER — Ambulatory Visit (HOSPITAL_COMMUNITY)
Admission: EM | Admit: 2016-06-27 | Discharge: 2016-06-27 | Disposition: A | Discharge status: Home / Self Care | Payer: Medicaid Other | Attending: Family Medicine | Admitting: Family Medicine

## 2016-06-27 ENCOUNTER — Encounter (HOSPITAL_COMMUNITY): Payer: Self-pay | Admitting: Emergency Medicine

## 2016-06-27 DIAGNOSIS — G8929 Other chronic pain: Secondary | ICD-10-CM | POA: Diagnosis not present

## 2016-06-27 DIAGNOSIS — M549 Dorsalgia, unspecified: Secondary | ICD-10-CM | POA: Diagnosis not present

## 2016-06-27 MED ORDER — KETOROLAC TROMETHAMINE 60 MG/2ML IM SOLN
INTRAMUSCULAR | Status: AC
  Filled 2016-06-27: qty 2

## 2016-06-27 MED ORDER — PREDNISONE 10 MG (21) PO TBPK: 10.0000 mg | ORAL_TABLET | Freq: Every day | ORAL | 0 refills | Status: DC

## 2016-06-27 MED ORDER — KETOROLAC TROMETHAMINE 60 MG/2ML IM SOLN: 60.0000 mg | Freq: Once | INTRAMUSCULAR | Status: DC

## 2016-06-27 NOTE — ED Triage Notes (Signed)
Pain in back and legs.  Pain started 06/21/16.  Patient has used heat and ice.

## 2016-06-27 NOTE — Discharge Instructions (Signed)
Follow up with Pain Management for pain control

## 2016-06-27 NOTE — ED Notes (Signed)
Notified bill oxford, np of patient's refusal.  Patient did not list medicine as an allergy or sensitivity.  Patient reports torodol upsets her stomach.

## 2016-06-27 NOTE — ED Provider Notes (Signed)
CSN: [id number]     Arrival date & time 06/27/16  1858 History   None    Chief Complaint  Patient presents with  . Back Pain   (Consider location/radiation/quality/duration/timing/severity/associated sxs/prior Treatment) Patient has chronic back pain and sees pain management.  She states she had facet joint injections and now she is having severe pain and wants to know if we can help her out.   The history is provided by the patient.  Back Pain  Location:  Thoracic spine and lumbar spine Quality:  Aching Radiates to:  Does not radiate Pain severity:  Severe Pain is:  Same all the time Onset quality:  Gradual Duration:  2 days Timing:  Constant Chronicity:  New Relieved by:  Nothing Worsened by:  Nothing Ineffective treatments:  None tried   Past Medical History:  Diagnosis Date  . ADHD (attention deficit hyperactivity disorder)   . Anxiety    no meds  . Depression    no meds  . Migraines    last one over 1 month ago  . Placenta previa    hx and with current pregnancy  . Preterm labor    c/s at 36 wks  . Scoliosis    Past Surgical History:  Procedure Laterality Date  . CESAREAN SECTION  2010   x 1 at 36 wks in [place]  . CESAREAN SECTION  February 13, 2012  . CHROMOPERTUBATION  10/26/2015   Procedure: ATTEMPTED CHROMOPERTUBATION;  Surgeon: Crawford Givens, MD;  Location: [facility];  Service: Gynecology;;  . LAPAROSCOPIC LYSIS OF ADHESIONS  10/26/2015   Procedure: LAPAROSCOPIC LYSIS OF ADHESIONS;  Surgeon: Crawford Givens, MD;  Location: [facility];  Service: Gynecology;;  . LAPAROSCOPY N/A 10/26/2015   Procedure: LAPAROSCOPY OPERATIVE WITH REMOVAL OF MISPLACED FILSHE CLIP;  Surgeon: Crawford Givens, MD;  Location: [facility];  Service: Gynecology;  Laterality: N/A;  . SVD  2003   x 1 in [place]  . TUBAL LIGATION    . WISDOM TOOTH EXTRACTION     Family History  Problem Relation Age of Onset  . Cancer Mother   . Hypertension Sister   . Sickle cell trait Daughter   . Asthma Son    . Asthma Paternal Grandmother   . Pseudochol deficiency Neg Hx   . Malignant hyperthermia Neg Hx   . Anesthesia problems Neg Hx   . Hypotension Neg Hx    Social History  Substance Use Topics  . Smoking status: Current Every Day Smoker    Packs/day: 0.10    Years: 16.00    Types: Cigarettes, E-cigarettes  . Smokeless tobacco: Never Used  . Alcohol use 0.0 oz/week     Comment: on occ   OB History    Gravida Para Term Preterm AB Living   3 3 1 2  0 3   SAB TAB Ectopic Multiple Live Births   0 0 0 0 3     Review of Systems  Constitutional: Negative.   HENT: Negative.   Eyes: Negative.   Respiratory: Negative.   Cardiovascular: Negative.   Gastrointestinal: Negative.   Endocrine: Negative.   Genitourinary: Negative.   Musculoskeletal: Positive for back pain.  Skin: Negative.   Allergic/Immunologic: Negative.   Neurological: Negative.   Hematological: Negative.   Psychiatric/Behavioral: Negative.     Allergies  Shrimp [shellfish allergy]  Home Medications   Prior to Admission medications   Medication Sig Start Date End Date Taking? Authorizing Provider  diphenhydrAMINE (BENADRYL) 25 MG tablet Take 50 mg by  mouth every 6 (six) hours as needed for allergies.    Historical Provider, MD  EPINEPHrine 0.3 mg/0.3 mL IJ SOAJ injection Inject 0.3 mLs (0.3 mg total) into the muscle once. 05/31/16   Nat Christen, MD  predniSONE (STERAPRED UNI-PAK 21 TAB) 10 MG (21) TBPK tablet Take 1 tablet (10 mg total) by mouth daily. Take 6 tabs by mouth daily  for 2 days, then 5 tabs for 2 days, then 4 tabs for 2 days, then 3 tabs for 2 days, 2 tabs for 2 days, then 1 tab by mouth daily for 2 days 06/27/16   Lysbeth Penner, FNP   Meds Ordered and Administered this Visit   Medications  ketorolac (TORADOL) injection 60 mg (60 mg Intramuscular Not Given 06/27/16 2025)    BP 127/77 (BP Location: Left Arm)   Pulse 93   Temp 98.4 F (36.9 C) (Oral)   Resp 16   LMP 05/23/2016 (Approximate)    SpO2 100%  No data found.   Physical Exam  Constitutional: She appears well-developed and well-nourished.  HENT:  Head: Normocephalic and atraumatic.  Eyes: Conjunctivae and EOM are normal. Pupils are equal, round, and reactive to light.  Neck: Normal range of motion. Neck supple.  Cardiovascular: Normal rate, regular rhythm and normal heart sounds.   Pulmonary/Chest: Effort normal and breath sounds normal.  Musculoskeletal: She exhibits tenderness.  TTP bilateral thoracic and lumbar spine    Urgent Care Course   Clinical Course    Procedures (including critical care time)  Labs Review Labs Reviewed - No data to display  Imaging Review No results found.   Visual Acuity Review  Right Eye Distance:   Left Eye Distance:   Bilateral Distance:    Right Eye Near:   Left Eye Near:    Bilateral Near:         MDM   1. Chronic back pain    Toradol 60mg  IM  Sterapred dose pack as directed #42 Follow up with Pain Mangement.    Lysbeth Penner, FNP 06/27/16 2040

## 2016-08-27 ENCOUNTER — Inpatient Hospital Stay (HOSPITAL_COMMUNITY)
Admission: AD | Admit: 2016-08-27 | Discharge: 2016-08-28 | Disposition: A | Discharge status: Home / Self Care | Payer: Medicaid Other | Source: Ambulatory Visit | Attending: Obstetrics and Gynecology | Admitting: Obstetrics and Gynecology

## 2016-08-27 ENCOUNTER — Encounter (HOSPITAL_COMMUNITY): Payer: Self-pay | Admitting: *Deleted

## 2016-08-27 DIAGNOSIS — F17210 Nicotine dependence, cigarettes, uncomplicated: Secondary | ICD-10-CM | POA: Diagnosis not present

## 2016-08-27 DIAGNOSIS — M419 Scoliosis, unspecified: Secondary | ICD-10-CM | POA: Insufficient documentation

## 2016-08-27 DIAGNOSIS — R102 Pelvic and perineal pain: Secondary | ICD-10-CM | POA: Diagnosis not present

## 2016-08-27 DIAGNOSIS — G8929 Other chronic pain: Secondary | ICD-10-CM | POA: Diagnosis not present

## 2016-08-27 LAB — CBC
HCT: 42.4 % (ref 36.0–46.0)
Hemoglobin: 14.7 g/dL (ref 12.0–15.0)
MCH: 28.5 pg (ref 26.0–34.0)
MCHC: 34.7 g/dL (ref 30.0–36.0)
MCV: 82.3 fL (ref 78.0–100.0)
Platelets: 320 10*3/uL (ref 150–400)
RBC: 5.15 MIL/uL — ABNORMAL HIGH (ref 3.87–5.11)
RDW: 14 % (ref 11.5–15.5)
WBC: 10.3 10*3/uL (ref 4.0–10.5)

## 2016-08-27 LAB — URINALYSIS, ROUTINE W REFLEX MICROSCOPIC
Bilirubin Urine: NEGATIVE
Glucose, UA: NEGATIVE mg/dL
Ketones, ur: NEGATIVE mg/dL
Leukocytes, UA: NEGATIVE
Nitrite: NEGATIVE
Protein, ur: NEGATIVE mg/dL
Specific Gravity, Urine: >1.03 — ABNORMAL HIGH (ref 1.005–1.030)
pH: 6 (ref 5.0–8.0)

## 2016-08-27 LAB — URINE MICROSCOPIC-ADD ON

## 2016-08-27 LAB — WET PREP, GENITAL
Clue Cells Wet Prep HPF POC: NONE SEEN
Sperm: NONE SEEN
Trich, Wet Prep: NONE SEEN
WBC, Wet Prep HPF POC: NONE SEEN
Yeast Wet Prep HPF POC: NONE SEEN

## 2016-08-27 LAB — POCT PREGNANCY, URINE: Preg Test, Ur: NEGATIVE

## 2016-08-27 NOTE — MAU Provider Note (Signed)
Chief Complaint: Pelvic Pain   None     SUBJECTIVE HPI: Penny [name] is a 35 y.o. [phone] who presents to maternity admissions reporting 1 month of right sided pelvic pain with significant worsening in last 2 days and associated with new onset of nausea/vomiting.  She reports surgery 1 year ago for similar pain and a filshie clip was removed from behind her uterus as it had detached from her tube. She has tried tylenol and ibuprofen which are not helping.  Nothing makes the pain better or worse.  The pain is rapidly worsening since onset.  It is associated with some intermittent vaginal spotting x 1 month. She denies vaginal itching/burning, urinary symptoms, h/a, dizziness, or fever/chills.     HPI  Past Medical History:  Diagnosis Date  . ADHD (attention deficit hyperactivity disorder)   . Anxiety    no meds  . Depression    no meds  . Migraines    last one over 1 month ago  . Placenta previa    hx and with current pregnancy  . Preterm labor    c/s at 36 wks  . Scoliosis    Past Surgical History:  Procedure Laterality Date  . CESAREAN SECTION  2010   x 1 at 36 wks in [place]  . CESAREAN SECTION  February 13, 2012  . CHROMOPERTUBATION  10/26/2015   Procedure: ATTEMPTED CHROMOPERTUBATION;  Surgeon: Crawford Givens, MD;  Location: [facility];  Service: Gynecology;;  . LAPAROSCOPIC LYSIS OF ADHESIONS  10/26/2015   Procedure: LAPAROSCOPIC LYSIS OF ADHESIONS;  Surgeon: Crawford Givens, MD;  Location: [facility];  Service: Gynecology;;  . LAPAROSCOPY N/A 10/26/2015   Procedure: LAPAROSCOPY OPERATIVE WITH REMOVAL OF MISPLACED FILSHE CLIP;  Surgeon: Crawford Givens, MD;  Location: [facility];  Service: Gynecology;  Laterality: N/A;  . SVD  2003   x 1 in [place]  . TUBAL LIGATION    . WISDOM TOOTH EXTRACTION     Social History   Social History  . Marital status: Married    Spouse name: N/A  . Number of children: N/A  . Years of education: N/A   Occupational History  . Not on file.   Social  History Main Topics  . Smoking status: Current Every Day Smoker    Packs/day: 0.10    Years: 16.00    Types: Cigarettes, E-cigarettes  . Smokeless tobacco: Never Used  . Alcohol use 0.0 oz/week     Comment: on occ  . Drug use: No  . Sexual activity: Yes    Birth control/ protection: Surgical     Comment: BTL   Other Topics Concern  . Not on file   Social History Narrative  . No narrative on file   No current facility-administered medications on file prior to encounter.    Current Outpatient Prescriptions on File Prior to Encounter  Medication Sig Dispense Refill  . diphenhydrAMINE (BENADRYL) 25 MG tablet Take 50 mg by mouth every 6 (six) hours as needed for allergies.    Marland Kitchen EPINEPHrine 0.3 mg/0.3 mL IJ SOAJ injection Inject 0.3 mLs (0.3 mg total) into the muscle once. 1 Device 2  . predniSONE (STERAPRED UNI-PAK 21 TAB) 10 MG (21) TBPK tablet Take 1 tablet (10 mg total) by mouth daily. Take 6 tabs by mouth daily  for 2 days, then 5 tabs for 2 days, then 4 tabs for 2 days, then 3 tabs for 2 days, 2 tabs for 2 days, then 1 tab by mouth daily for  2 days 42 tablet 0  . [DISCONTINUED] Cetirizine HCl 10 MG CAPS Take 1 capsule (10 mg total) by mouth at bedtime. 30 capsule 0  . [DISCONTINUED] fluticasone (FLONASE) 50 MCG/ACT nasal spray Place 2 sprays into both nostrils daily. (Patient not taking: Reported on 03/08/2015) 16 g 0  . [DISCONTINUED] gabapentin (NEURONTIN) 300 MG capsule Take 1 capsule (300 mg total) by mouth 3 (three) times daily. (Patient not taking: Reported on 11/24/2014) 30 capsule 1  . [DISCONTINUED] ipratropium (ATROVENT) 0.06 % nasal spray Place 2 sprays into both nostrils 4 (four) times daily. For nasal congestion (Patient not taking: Reported on 03/08/2015) 15 mL 0  . [DISCONTINUED] omeprazole (PRILOSEC) 20 MG capsule Take 1 capsule (20 mg total) by mouth daily. (Patient not taking: Reported on 11/28/2014) 30 capsule 0   Allergies  Allergen Reactions  . Shrimp [Shellfish  Allergy] Itching and Swelling    Itching and swelling in mouth and face    ROS:  Review of Systems  Constitutional: Negative for chills, fatigue and fever.  Respiratory: Negative for shortness of breath.   Cardiovascular: Negative for chest pain.  Gastrointestinal: Positive for abdominal pain and nausea.  Genitourinary: Positive for pelvic pain. Negative for difficulty urinating, dysuria, flank pain, vaginal bleeding, vaginal discharge and vaginal pain.  Neurological: Negative for dizziness and headaches.  Psychiatric/Behavioral: Negative.      I have reviewed patient's Past Medical Hx, Surgical Hx, Family Hx, Social Hx, medications and allergies.   Physical Exam   Patient Vitals for the past 24 hrs:  BP Temp Pulse Resp Height Weight  08/28/16 0105 139/95 98 F (36.7 C) 81 18 - -  08/27/16 2132 139/90 99.1 F (37.3 C) 98 18 5\' 4"  (1.626 m) 187 lb 3.2 oz (84.9 kg)   Constitutional: Well-developed, well-nourished female in no acute distress.  Cardiovascular: normal rate Respiratory: normal effort GI: Abd soft, non-tender. No rebound tenderness or guarding.  Pos BS x 4 MS: Extremities nontender, no edema, normal ROM Neurologic: Alert and oriented x 4.  GU: Neg CVAT.  PELVIC EXAM: Cervix pink, visually closed, without lesion, scant white creamy discharge, vaginal walls and external genitalia normal Bimanual exam: Cervix 0/long/high, firm, anterior, positive CMT, uterus mildly tender, nonenlarged, adnexa with significant tenderness on right, none on left, no enlargement or mass bilaterally     LAB RESULTS Results for orders placed or performed during the hospital encounter of 08/27/16 (from the past 24 hour(s))  Urinalysis, Routine w reflex microscopic (not at [facility])     Status: Abnormal   Collection Time: 08/27/16  9:40 PM  Result Value Ref Range   Color, Urine YELLOW YELLOW   APPearance CLEAR CLEAR   Specific Gravity, Urine >1.030 (H) 1.005 - 1.030   pH 6.0 5.0 - 8.0    Glucose, UA NEGATIVE NEGATIVE mg/dL   Hgb urine dipstick MODERATE (A) NEGATIVE   Bilirubin Urine NEGATIVE NEGATIVE   Ketones, ur NEGATIVE NEGATIVE mg/dL   Protein, ur NEGATIVE NEGATIVE mg/dL   Nitrite NEGATIVE NEGATIVE   Leukocytes, UA NEGATIVE NEGATIVE  Urine microscopic-add on     Status: Abnormal   Collection Time: 08/27/16  9:40 PM  Result Value Ref Range   Squamous Epithelial / LPF 0-5 (A) NONE SEEN   WBC, UA 0-5 0 - 5 WBC/hpf   RBC / HPF 0-5 0 - 5 RBC/hpf   Bacteria, UA FEW (A) NONE SEEN   Crystals CA OXALATE CRYSTALS (A) NEGATIVE   Urine-Other MUCOUS PRESENT   Pregnancy, urine POC  Status: None   Collection Time: 08/27/16 10:01 PM  Result Value Ref Range   Preg Test, Ur NEGATIVE NEGATIVE  CBC     Status: Abnormal   Collection Time: 08/27/16 10:46 PM  Result Value Ref Range   WBC 10.3 4.0 - 10.5 K/uL   RBC 5.15 (H) 3.87 - 5.11 MIL/uL   Hemoglobin 14.7 12.0 - 15.0 g/dL   HCT 42.4 36.0 - 46.0 %   MCV 82.3 78.0 - 100.0 fL   MCH 28.5 26.0 - 34.0 pg   MCHC 34.7 30.0 - 36.0 g/dL   RDW 14.0 11.5 - 15.5 %   Platelets 320 150 - 400 K/uL  hCG, serum, qualitative     Status: None   Collection Time: 08/27/16 10:46 PM  Result Value Ref Range   Preg, Serum NEGATIVE NEGATIVE  Wet prep, genital     Status: None   Collection Time: 08/27/16 11:40 PM  Result Value Ref Range   Yeast Wet Prep HPF POC NONE SEEN NONE SEEN   Trich, Wet Prep NONE SEEN NONE SEEN   Clue Cells Wet Prep HPF POC NONE SEEN NONE SEEN   WBC, Wet Prep HPF POC NONE SEEN NONE SEEN   Sperm NONE SEEN        IMAGING US Transvaginal Non-ob  Result Date: 08/28/2016 CLINICAL DATA:  35 y/o F; intermittent right greater than left pelvic pain since 08/14/2016. History of 2 Cesarean sections, bilateral tubal ligation, and laparotomy. EXAM: TRANSABDOMINAL AND TRANSVAGINAL ULTRASOUND OF PELVIS DOPPLER ULTRASOUND OF OVARIES TECHNIQUE: Both transabdominal and transvaginal ultrasound examinations of the pelvis were  performed. Transabdominal technique was performed for global imaging of the pelvis including uterus, ovaries, adnexal regions, and pelvic cul-de-sac. It was necessary to proceed with endovaginal exam following the transabdominal exam to visualize the adnexa. Color and duplex Doppler ultrasound was utilized to evaluate blood flow to the ovaries. COMPARISON:  01/23/2016 pelvis ultrasound FINDINGS: Uterus Measurements: 8.6 x 4.2 x 6.9 cm. Single dorsal fundal sub serosal fibroid measuring 1.0 x 0.7 x 1.1 cm. Endometrium Thickness: 14.1 mm.  No focal abnormality visualized. Right ovary Measurements: 4.4 x 2.5 x 1.6 cm. Normal appearance/no adnexal mass. Left ovary Measurements: 3.3 x 2.4 x 2.9 cm. Normal appearance/no adnexal mass. Pulsed Doppler evaluation of both ovaries demonstrates normal low-resistance arterial and venous waveforms. Other findings Trace physiologic fluid within the pelvis. IMPRESSION: No acute process identified. No ovarian torsion at this time. 11 mm dorsal fundal sub serosal fibroid. Electronically Signed   By: Kristine Garbe M.D.   On: 08/28/2016 01:03   US Pelvis Complete  Result Date: 08/28/2016 CLINICAL DATA:  35 y/o F; intermittent right greater than left pelvic pain since 08/14/2016. History of 2 Cesarean sections, bilateral tubal ligation, and laparotomy. EXAM: TRANSABDOMINAL AND TRANSVAGINAL ULTRASOUND OF PELVIS DOPPLER ULTRASOUND OF OVARIES TECHNIQUE: Both transabdominal and transvaginal ultrasound examinations of the pelvis were performed. Transabdominal technique was performed for global imaging of the pelvis including uterus, ovaries, adnexal regions, and pelvic cul-de-sac. It was necessary to proceed with endovaginal exam following the transabdominal exam to visualize the adnexa. Color and duplex Doppler ultrasound was utilized to evaluate blood flow to the ovaries. COMPARISON:  01/23/2016 pelvis ultrasound FINDINGS: Uterus Measurements: 8.6 x 4.2 x 6.9 cm. Single  dorsal fundal sub serosal fibroid measuring 1.0 x 0.7 x 1.1 cm. Endometrium Thickness: 14.1 mm.  No focal abnormality visualized. Right ovary Measurements: 4.4 x 2.5 x 1.6 cm. Normal appearance/no adnexal mass. Left ovary Measurements: 3.3 x 2.4  x 2.9 cm. Normal appearance/no adnexal mass. Pulsed Doppler evaluation of both ovaries demonstrates normal low-resistance arterial and venous waveforms. Other findings Trace physiologic fluid within the pelvis. IMPRESSION: No acute process identified. No ovarian torsion at this time. 11 mm dorsal fundal sub serosal fibroid. Electronically Signed   By: Kristine Garbe M.D.   On: 08/28/2016 01:03   [place] Art/ven Flow Abd Pelv Doppler  Result Date: 08/28/2016 CLINICAL DATA:  35 y/o F; intermittent right greater than left pelvic pain since 08/14/2016. History of 2 Cesarean sections, bilateral tubal ligation, and laparotomy. EXAM: TRANSABDOMINAL AND TRANSVAGINAL ULTRASOUND OF PELVIS DOPPLER ULTRASOUND OF OVARIES TECHNIQUE: Both transabdominal and transvaginal ultrasound examinations of the pelvis were performed. Transabdominal technique was performed for global imaging of the pelvis including uterus, ovaries, adnexal regions, and pelvic cul-de-sac. It was necessary to proceed with endovaginal exam following the transabdominal exam to visualize the adnexa. Color and duplex Doppler ultrasound was utilized to evaluate blood flow to the ovaries. COMPARISON:  01/23/2016 pelvis ultrasound FINDINGS: Uterus Measurements: 8.6 x 4.2 x 6.9 cm. Single dorsal fundal sub serosal fibroid measuring 1.0 x 0.7 x 1.1 cm. Endometrium Thickness: 14.1 mm.  No focal abnormality visualized. Right ovary Measurements: 4.4 x 2.5 x 1.6 cm. Normal appearance/no adnexal mass. Left ovary Measurements: 3.3 x 2.4 x 2.9 cm. Normal appearance/no adnexal mass. Pulsed Doppler evaluation of both ovaries demonstrates normal low-resistance arterial and venous waveforms. Other findings Trace physiologic fluid  within the pelvis. IMPRESSION: No acute process identified. No ovarian torsion at this time. 11 mm dorsal fundal sub serosal fibroid. Electronically Signed   By: Kristine Garbe M.D.   On: 08/28/2016 01:03    MAU Management/MDM: Ordered labs and [place] and reviewed results. [place] essentially normal with small 1x1x1 cm fundal fibroid and no adnexal mass and no evidence of torsion.  No acute findings on today's exam. Consult Dr Cletis Media. Likely dysmenorrhea or ovulatory pain since it is associated with AUB. Pt does have hx of chronic pain with multiple ER/MAU visits.  Plan to treat pain with Toradol and nausea with Phenergan.  Pt declined Toradol reporting it upsets her stomach and does not help her pain. Treatments in MAU included Phenergan 25 mg PO x 1 dose. Pt to follow up in office with Dr Charlesetta Garibaldi. Pt stable at time of discharge.  ASSESSMENT 1. Chronic female pelvic pain   2. Adnexal pain   3. Acute pelvic pain, female     PLAN Discharge home   Medication List    STOP taking these medications   diphenhydrAMINE 25 MG tablet Commonly known as:  BENADRYL   predniSONE 10 MG (21) Tbpk tablet Commonly known as:  STERAPRED UNI-PAK 21 TAB     TAKE these medications   EPINEPHrine 0.3 mg/0.3 mL Soaj injection Commonly known as:  EPI-PEN Inject 0.3 mLs (0.3 mg total) into the muscle once.   naproxen sodium 220 MG tablet Commonly known as:  ANAPROX Take 220 mg by mouth 2 (two) times daily with a meal.   promethazine 25 MG tablet Commonly known as:  PHENERGAN Take 0.5-1 tablets (12.5-25 mg total) by mouth every 6 (six) hours as needed for nausea.      Follow-up Information    [id number] A, MD .   Specialty:  Obstetrics and Gynecology Why:  Call on Monday to schedule follow up appointment, Return to MAU as needed for emergencies Contact information: [address] [place] [address] [phone]  Fatima Blank Certified  Nurse-Midwife 08/28/2016  1:12 AM

## 2016-08-27 NOTE — MAU Note (Addendum)
Having a lot of pain in R groin area for 2-3wks. Comes and goes. Spotting for a month. LMP 8/16. Spotting since 07/30/16. Had surgery last yr at this time for same type pain. Clip from R tube off and sitting behind uterus which was removed.

## 2016-08-28 ENCOUNTER — Inpatient Hospital Stay (HOSPITAL_COMMUNITY): Payer: Medicaid Other

## 2016-08-28 DIAGNOSIS — G8929 Other chronic pain: Secondary | ICD-10-CM | POA: Diagnosis not present

## 2016-08-28 DIAGNOSIS — R102 Pelvic and perineal pain: Secondary | ICD-10-CM | POA: Diagnosis not present

## 2016-08-28 LAB — HCG, SERUM, QUALITATIVE: Preg, Serum: NEGATIVE

## 2016-08-28 MED ORDER — PROMETHAZINE HCL 25 MG PO TABS
25.0000 mg | ORAL_TABLET | Freq: Once | ORAL | Status: AC
  Administered 2016-08-28: 25 mg via ORAL
  Filled 2016-08-28: qty 1

## 2016-08-28 MED ORDER — PROMETHAZINE HCL 25 MG PO TABS: 12.5000 mg | ORAL_TABLET | Freq: Four times a day (QID) | ORAL | 0 refills | Status: DC | PRN

## 2016-08-28 MED ORDER — KETOROLAC TROMETHAMINE 60 MG/2ML IM SOLN: 60.0000 mg | INTRAMUSCULAR | Status: DC

## 2016-08-28 NOTE — Discharge Instructions (Signed)
Pelvic Pain, Female °Female pelvic pain can be caused by many different things and start from a variety of places. Pelvic pain refers to pain that is located in the lower half of the abdomen and between your hips. The pain may occur over a short period of time (acute) or may be reoccurring (chronic). The cause of pelvic pain may be related to disorders affecting the female reproductive organs (gynecologic), but it may also be related to the bladder, kidney stones, an intestinal complication, or muscle or skeletal problems. Getting help right away for pelvic pain is important, especially if there has been severe, sharp, or a sudden onset of unusual pain. It is also important to get help right away because some types of pelvic pain can be life threatening.  °CAUSES  °Below are only some of the causes of pelvic pain. The causes of pelvic pain can be in one of several categories.  °· Gynecologic. °¨ Pelvic inflammatory disease. °¨ Sexually transmitted infection. °¨ Ovarian cyst or a twisted ovarian ligament (ovarian torsion). °¨ Uterine lining that grows outside the uterus (endometriosis). °¨ Fibroids, cysts, or tumors. °¨ Ovulation. °· Pregnancy. °¨ Pregnancy that occurs outside the uterus (ectopic pregnancy). °¨ Miscarriage. °¨ Labor. °¨ Abruption of the placenta or ruptured uterus. °· Infection. °¨ Uterine infection (endometritis). °¨ Bladder infection. °¨ Diverticulitis. °¨ Miscarriage related to a uterine infection (septic abortion). °· Bladder. °¨ Inflammation of the bladder (cystitis). °¨ Kidney stone(s). °· Gastrointestinal. °¨ Constipation. °¨ Diverticulitis. °· Neurologic. °¨ Trauma. °¨ Feeling pelvic pain because of mental or emotional causes (psychosomatic). °· Cancers of the bowel or pelvis. °EVALUATION  °Your caregiver will want to take a careful history of your concerns. This includes recent changes in your health, a careful gynecologic history of your periods (menses), and a sexual history. Obtaining  your family history and medical history is also important. Your caregiver may suggest a pelvic exam. A pelvic exam will help identify the location and severity of the pain. It also helps in the evaluation of which organ system may be involved. In order to identify the cause of the pelvic pain and be properly treated, your caregiver may order tests. These tests may include:  °· A pregnancy test. °· Pelvic ultrasonography. °· An X-ray exam of the abdomen. °· A urinalysis or evaluation of vaginal discharge. °· Blood tests. °HOME CARE INSTRUCTIONS  °· Only take over-the-counter or prescription medicines for pain, discomfort, or fever as directed by your caregiver.   °· Rest as directed by your caregiver.   °· Eat a balanced diet.   °· Drink enough fluids to make your urine clear or pale yellow, or as directed.   °· Avoid sexual intercourse if it causes pain.   °· Apply warm or cold compresses to the lower abdomen depending on which one helps the pain.   °· Avoid stressful situations.   °· Keep a journal of your pelvic pain. Write down when it started, where the pain is located, and if there are things that seem to be associated with the pain, such as food or your menstrual cycle. °· Follow up with your caregiver as directed.   °SEEK MEDICAL CARE IF: °· Your medicine does not help your pain. °· You have abnormal vaginal discharge. °SEEK IMMEDIATE MEDICAL CARE IF:  °· You have heavy bleeding from the vagina.   °· Your pelvic pain increases.   °· You feel light-headed or faint.   °· You have chills.   °· You have pain with urination or blood in your urine.   °· You have uncontrolled   diarrhea or vomiting.   You have a fever or persistent symptoms for more than 3 days.  You have a fever and your symptoms suddenly get worse.   You are being physically or sexually abused.   This information is not intended to replace advice given to you by your health care provider. Make sure you discuss any questions you have with  your health care provider.   Document Released: 09/27/2004 Document Revised: 07/22/2015 Document Reviewed: 02/20/2012 Elsevier Interactive Patient Education 2016 [place] is pain in the lower abdomen that is felt between periods.  The pain affects one side of the abdomen. The side that it affects may change from month to month.  The pain may be mild or severe.  The pain may last from minutes to hours. It does not last longer than 1-2 days.  The pain can happen with nausea and light vaginal bleeding. [place]merz is common among women. It is caused by the growth and release of an egg from an ovary, and it is a natural part of the ovulation cycle. It often happens about two weeks after a woman's period ends. HOME CARE INSTRUCTIONS Pay attention to any changes in your condition. Take these actions to help with your pain:  Try soaking in a hot bath.  Take over-the-counter and prescription medicines only as told by your health care provider.  Keep all follow-up visits as told by your health care provider. This is important. SEEK MEDICAL CARE IF:  You have very bad pain most months.  You have abdominal pain that lasts longer than 24 hours.  Your pain medicine is not helping.  You have a fever.  You have nausea or vomiting that will not go away.  You miss your period.  You have vaginal bleeding between your periods that is heavier than spotting.   This information is not intended to replace advice given to you by your health care provider. Make sure you discuss any questions you have with your health care provider.   Document Released: 10/21/2002 Document Revised: 07/22/2015 Document Reviewed: 01/26/2015 Elsevier Interactive Patient Education [employer].

## 2016-08-28 NOTE — Progress Notes (Signed)
Written and verbal d/c instructions given by Alycia Rossetti RN and pt voiced understanding

## 2016-08-29 LAB — GC/CHLAMYDIA PROBE AMP (~~LOC~~) NOT AT ARMC
Chlamydia: NEGATIVE
Neisseria Gonorrhea: NEGATIVE

## 2016-11-22 ENCOUNTER — Inpatient Hospital Stay (HOSPITAL_COMMUNITY)
Admission: AD | Admit: 2016-11-22 | Discharge: 2016-11-22 | Disposition: A | Discharge status: Home / Self Care | Payer: Medicaid Other | Source: Ambulatory Visit | Attending: Obstetrics & Gynecology | Admitting: Obstetrics & Gynecology

## 2016-11-22 ENCOUNTER — Encounter (HOSPITAL_COMMUNITY): Payer: Self-pay | Admitting: *Deleted

## 2016-11-22 DIAGNOSIS — G8929 Other chronic pain: Secondary | ICD-10-CM

## 2016-11-22 DIAGNOSIS — F17210 Nicotine dependence, cigarettes, uncomplicated: Secondary | ICD-10-CM | POA: Diagnosis not present

## 2016-11-22 DIAGNOSIS — Z3202 Encounter for pregnancy test, result negative: Secondary | ICD-10-CM | POA: Insufficient documentation

## 2016-11-22 DIAGNOSIS — R102 Pelvic and perineal pain: Secondary | ICD-10-CM

## 2016-11-22 LAB — URINALYSIS, ROUTINE W REFLEX MICROSCOPIC
Bilirubin Urine: NEGATIVE
Glucose, UA: NEGATIVE mg/dL
Ketones, ur: NEGATIVE mg/dL
Leukocytes, UA: NEGATIVE
Nitrite: NEGATIVE
Protein, ur: NEGATIVE mg/dL
Specific Gravity, Urine: 1.021 (ref 1.005–1.030)
pH: 5 (ref 5.0–8.0)

## 2016-11-22 LAB — CBC WITH DIFFERENTIAL/PLATELET
Basophils Absolute: 0.1 10*3/uL (ref 0.0–0.1)
Basophils Relative: 1 %
Eosinophils Absolute: 0.3 10*3/uL (ref 0.0–0.7)
Eosinophils Relative: 3 %
HCT: 41.8 % (ref 36.0–46.0)
Hemoglobin: 14.8 g/dL (ref 12.0–15.0)
Lymphocytes Relative: 25 %
Lymphs Abs: 2.5 10*3/uL (ref 0.7–4.0)
MCH: 29 pg (ref 26.0–34.0)
MCHC: 35.4 g/dL (ref 30.0–36.0)
MCV: 82 fL (ref 78.0–100.0)
Monocytes Absolute: 0.3 10*3/uL (ref 0.1–1.0)
Monocytes Relative: 3 %
Neutro Abs: 6.8 10*3/uL (ref 1.7–7.7)
Neutrophils Relative %: 68 %
Platelets: 355 10*3/uL (ref 150–400)
RBC: 5.1 MIL/uL (ref 3.87–5.11)
RDW: 14.1 % (ref 11.5–15.5)
WBC: 9.9 10*3/uL (ref 4.0–10.5)

## 2016-11-22 LAB — POCT PREGNANCY, URINE: Preg Test, Ur: NEGATIVE

## 2016-11-22 LAB — HCG, QUANTITATIVE, PREGNANCY: hCG, Beta Chain, Quant, S: <1 m[IU]/mL (ref <5)

## 2016-11-22 MED ORDER — KETOROLAC TROMETHAMINE 60 MG/2ML IM SOLN
60.0000 mg | Freq: Once | INTRAMUSCULAR | Status: AC
  Administered 2016-11-22: 60 mg via INTRAMUSCULAR
  Filled 2016-11-22: qty 2

## 2016-11-22 NOTE — Discharge Instructions (Signed)
Pelvic Pain, Female Pelvic pain is pain felt below the belly button and between your hips. It can be caused by many different things. It is important to get help right away. This is especially true for severe, sharp, or unusual pain that comes on suddenly.  HOME CARE  Only take medicine as told by your doctor.  Rest as told by your doctor.  Eat a healthy diet, such as fruits, vegetables, and lean meats.  Drink enough fluids to keep your pee (urine) clear or pale yellow, or as told.  Avoid sex (intercourse) if it causes pain.  Apply warm or cold packs to your lower belly (abdomen). Use the type of pack that helps the pain.  Avoid situations that cause you stress.  Keep a journal to track your pain. Write down:  When the pain started.  Where it is located.  If there are things that seem to be related to the pain, such as food or your period.  Follow up with your doctor as told. GET HELP RIGHT AWAY IF:   You have heavy bleeding from the vagina.  You have more pelvic pain.  You feel lightheaded or pass out (faint).  You have chills.  You have pain when you pee or have blood in your pee.  You cannot stop having watery poop (diarrhea).  You cannot stop throwing up (vomiting).  You have a fever or lasting symptoms for more than 3 days.  You have a fever and your symptoms suddenly get worse.  You are being physically or sexually abused.  Your medicine does not help your pain.  You have fluid (discharge) coming from your vagina that is not normal. MAKE SURE YOU:  Understand these instructions.  Will watch your condition.  Will get help if you are not doing well or get worse. This information is not intended to replace advice given to you by your health care provider. Make sure you discuss any questions you have with your health care provider. Document Released: 04/18/2008 Document Revised: 11/21/2014 Document Reviewed: 08/21/2015 Elsevier Interactive Patient  Education  2017 [employer].

## 2016-11-22 NOTE — MAU Provider Note (Signed)
History     CSN: [id number]  Arrival date and time: 11/22/16 [id number]   First Provider Initiated Contact with Patient 11/22/16 0300      Chief Complaint  Patient presents with  . Pelvic Pain   Pelvic Pain  The patient's primary symptoms include pelvic pain. The patient's pertinent negatives include no vaginal discharge. This is a new problem. Episode onset: 4 days ago.  The problem occurs constantly. The pain is severe. The problem affects the right side. She is not pregnant. Associated symptoms include nausea. Pertinent negatives include no chills, constipation, diarrhea, dysuria, fever or vomiting. The vaginal bleeding is spotting (only when wiping. ). Nothing aggravates the symptoms. Treatments tried: flexeril, and tramadol and aleve.  The treatment provided no relief. She is sexually active. She uses tubal ligation for contraception. Her menstrual history has been regular (LMP 11/11/16 ).   Past Medical History:  Diagnosis Date  . ADHD (attention deficit hyperactivity disorder)   . Anxiety    no meds  . Depression    no meds  . Migraines    last one over 1 month ago  . Placenta previa    hx and with current pregnancy  . Preterm labor    c/s at 36 wks  . Scoliosis     Past Surgical History:  Procedure Laterality Date  . CESAREAN SECTION  2010   x 1 at 36 wks in [place]  . CESAREAN SECTION  February 13, 2012  . CHROMOPERTUBATION  10/26/2015   Procedure: ATTEMPTED CHROMOPERTUBATION;  Surgeon: Crawford Givens, MD;  Location: [facility];  Service: Gynecology;;  . LAPAROSCOPIC LYSIS OF ADHESIONS  10/26/2015   Procedure: LAPAROSCOPIC LYSIS OF ADHESIONS;  Surgeon: Crawford Givens, MD;  Location: [facility];  Service: Gynecology;;  . LAPAROSCOPY N/A 10/26/2015   Procedure: LAPAROSCOPY OPERATIVE WITH REMOVAL OF MISPLACED FILSHE CLIP;  Surgeon: Crawford Givens, MD;  Location: [facility];  Service: Gynecology;  Laterality: N/A;  . SVD  2003   x 1 in [place]  . TUBAL LIGATION    . WISDOM TOOTH EXTRACTION       Family History  Problem Relation Age of Onset  . Cancer Mother   . Hypertension Sister   . Sickle cell trait Daughter   . Asthma Son   . Asthma Paternal Grandmother   . Pseudochol deficiency Neg Hx   . Malignant hyperthermia Neg Hx   . Anesthesia problems Neg Hx   . Hypotension Neg Hx     Social History  Substance Use Topics  . Smoking status: Current Every Day Smoker    Packs/day: 0.10    Years: 16.00    Types: Cigarettes, E-cigarettes  . Smokeless tobacco: Never Used  . Alcohol use 0.0 oz/week     Comment: on occ    Allergies:  Allergies  Allergen Reactions  . Shrimp [Shellfish Allergy] Itching and Swelling    Itching and swelling in mouth and face    Prescriptions Prior to Admission  Medication Sig Dispense Refill Last Dose  . EPINEPHrine 0.3 mg/0.3 mL IJ SOAJ injection Inject 0.3 mLs (0.3 mg total) into the muscle once. 1 Device 2 Unknown at Unknown time  . naproxen sodium (ANAPROX) 220 MG tablet Take 220 mg by mouth 2 (two) times daily with a meal.   08/27/2016 at Unknown time  . promethazine (PHENERGAN) 25 MG tablet Take 0.5-1 tablets (12.5-25 mg total) by mouth every 6 (six) hours as needed for nausea. 30 tablet 0     Review of  Systems  Constitutional: Negative for chills and fever.  Gastrointestinal: Positive for nausea. Negative for constipation, diarrhea and vomiting.  Genitourinary: Positive for pelvic pain and vaginal bleeding. Negative for dysuria, vaginal discharge and vaginal pain.   Physical Exam   Blood pressure (!) 146/110, pulse 118, temperature 99.1 F (37.3 C), temperature source Oral, resp. rate 18, height 5\' 3"  (1.6 m), weight 190 lb 12 oz (86.5 kg), last menstrual period 11/11/2016.  Physical Exam  Nursing note and vitals reviewed. Constitutional: She is oriented to person, place, and time. She appears well-developed.  HENT:  Head: Normocephalic.  Cardiovascular: Normal rate.   Respiratory: Effort normal.  GI: Soft. There is no  tenderness. There is no rebound.  Neurological: She is alert and oriented to person, place, and time.  Skin: Skin is warm and dry.  Psychiatric: She has a normal mood and affect.   Results for orders placed or performed during the hospital encounter of 11/22/16 (from the past 24 hour(s))  Urinalysis, Routine w reflex microscopic     Status: Abnormal   Collection Time: 11/22/16  2:49 AM  Result Value Ref Range   Color, Urine YELLOW YELLOW   APPearance HAZY (A) CLEAR   Specific Gravity, Urine 1.021 1.005 - 1.030   pH 5.0 5.0 - 8.0   Glucose, UA NEGATIVE NEGATIVE mg/dL   Hgb urine dipstick MODERATE (A) NEGATIVE   Bilirubin Urine NEGATIVE NEGATIVE   Ketones, ur NEGATIVE NEGATIVE mg/dL   Protein, ur NEGATIVE NEGATIVE mg/dL   Nitrite NEGATIVE NEGATIVE   Leukocytes, UA NEGATIVE NEGATIVE   RBC / HPF 0-5 0 - 5 RBC/hpf   WBC, UA 0-5 0 - 5 WBC/hpf   Bacteria, UA RARE (A) NONE SEEN   Squamous Epithelial / LPF 6-30 (A) NONE SEEN   Mucous PRESENT   Pregnancy, urine POC     Status: None   Collection Time: 11/22/16  3:01 AM  Result Value Ref Range   Preg Test, Ur NEGATIVE NEGATIVE  CBC with Differential/Platelet     Status: None   Collection Time: 11/22/16  3:29 AM  Result Value Ref Range   WBC 9.9 4.0 - 10.5 K/uL   RBC 5.10 3.87 - 5.11 MIL/uL   Hemoglobin 14.8 12.0 - 15.0 g/dL   HCT 41.8 36.0 - 46.0 %   MCV 82.0 78.0 - 100.0 fL   MCH 29.0 26.0 - 34.0 pg   MCHC 35.4 30.0 - 36.0 g/dL   RDW 14.1 11.5 - 15.5 %   Platelets 355 150 - 400 K/uL   Neutrophils Relative % 68 %   Neutro Abs 6.8 1.7 - 7.7 K/uL   Lymphocytes Relative 25 %   Lymphs Abs 2.5 0.7 - 4.0 K/uL   Monocytes Relative 3 %   Monocytes Absolute 0.3 0.1 - 1.0 K/uL   Eosinophils Relative 3 %   Eosinophils Absolute 0.3 0.0 - 0.7 K/uL   Basophils Relative 1 %   Basophils Absolute 0.1 0.0 - 0.1 K/uL     MAU Course  Procedures  MDM Patient has had toradol. She reports that her pain is better.   Assessment and Plan   1.  Chronic pelvic pain in female    DC home Comfort measures reviewed  RX: none  Return to MAU as needed FU with OB as planned  [facility] Follow up.   Specialty:  Obstetrics and Gynecology Contact information: [address] [place]  [phone]  Mathis Bud 11/22/2016, 3:03 AM

## 2016-11-22 NOTE — MAU Note (Signed)
PT  SAYS SHE HAS  LOWER ABD  PAIN ON RIGHT  SIDE -    STARTED  ON  SAT -    TOOK FLEXERIL  SAT / SUN-  NO RELIEF.   TOOK TRAMADOL-     ON SAT/ SUN-  NO RELIEF.   NO BIRTH CONTROL.   LAST SEX-    YESTERDAY.     HPT- DONE ON Sunday-  FAINT LINE

## 2016-11-27 ENCOUNTER — Emergency Department (HOSPITAL_COMMUNITY): Payer: Medicaid Other

## 2016-11-27 ENCOUNTER — Encounter (HOSPITAL_COMMUNITY): Payer: Self-pay

## 2016-11-27 ENCOUNTER — Emergency Department (HOSPITAL_COMMUNITY)
Admission: EM | Admit: 2016-11-27 | Discharge: 2016-11-27 | Disposition: A | Discharge status: Home / Self Care | Payer: Medicaid Other | Attending: Emergency Medicine | Admitting: Emergency Medicine

## 2016-11-27 DIAGNOSIS — F17210 Nicotine dependence, cigarettes, uncomplicated: Secondary | ICD-10-CM | POA: Diagnosis not present

## 2016-11-27 DIAGNOSIS — R000 Tachycardia, unspecified: Secondary | ICD-10-CM | POA: Diagnosis not present

## 2016-11-27 DIAGNOSIS — F909 Attention-deficit hyperactivity disorder, unspecified type: Secondary | ICD-10-CM | POA: Diagnosis not present

## 2016-11-27 DIAGNOSIS — R002 Palpitations: Secondary | ICD-10-CM | POA: Diagnosis present

## 2016-11-27 DIAGNOSIS — Z79899 Other long term (current) drug therapy: Secondary | ICD-10-CM | POA: Insufficient documentation

## 2016-11-27 LAB — BASIC METABOLIC PANEL
Anion gap: 10 (ref 5–15)
BUN: 12 mg/dL (ref 6–20)
CO2: 22 mmol/L (ref 22–32)
Calcium: 9.6 mg/dL (ref 8.9–10.3)
Chloride: 104 mmol/L (ref 101–111)
Creatinine, Ser: 0.74 mg/dL (ref 0.44–1.00)
GFR calc Af Amer: >60 mL/min (ref >60)
GFR calc non Af Amer: >60 mL/min (ref >60)
Glucose, Bld: 107 mg/dL — ABNORMAL HIGH (ref 65–99)
Potassium: 3.4 mmol/L — ABNORMAL LOW (ref 3.5–5.1)
Sodium: 136 mmol/L (ref 135–145)

## 2016-11-27 LAB — CBC WITH DIFFERENTIAL/PLATELET
Basophils Absolute: 0.1 10*3/uL (ref 0.0–0.1)
Basophils Relative: 1 %
Eosinophils Absolute: 0.3 10*3/uL (ref 0.0–0.7)
Eosinophils Relative: 2 %
HCT: 46.8 % — ABNORMAL HIGH (ref 36.0–46.0)
Hemoglobin: 16.5 g/dL — ABNORMAL HIGH (ref 12.0–15.0)
Lymphocytes Relative: 21 %
Lymphs Abs: 3.7 10*3/uL (ref 0.7–4.0)
MCH: 28.9 pg (ref 26.0–34.0)
MCHC: 35.3 g/dL (ref 30.0–36.0)
MCV: 82.1 fL (ref 78.0–100.0)
Monocytes Absolute: 0.8 10*3/uL (ref 0.1–1.0)
Monocytes Relative: 4 %
Neutro Abs: 12.9 10*3/uL — ABNORMAL HIGH (ref 1.7–7.7)
Neutrophils Relative %: 72 %
Platelets: 370 10*3/uL (ref 150–400)
RBC: 5.7 MIL/uL — ABNORMAL HIGH (ref 3.87–5.11)
RDW: 13.8 % (ref 11.5–15.5)
WBC: 17.7 10*3/uL — ABNORMAL HIGH (ref 4.0–10.5)

## 2016-11-27 LAB — I-STAT TROPONIN, ED: Troponin i, poc: 0 ng/mL (ref 0.00–0.08)

## 2016-11-27 LAB — D-DIMER, QUANTITATIVE: D-Dimer, Quant: <0.27 ug/mL-FEU (ref 0.00–0.50)

## 2016-11-27 LAB — TSH: TSH: 1.812 u[IU]/mL (ref 0.350–4.500)

## 2016-11-27 MED ORDER — SODIUM CHLORIDE 0.9 % IV BOLUS (SEPSIS)
1000.0000 mL | Freq: Once | INTRAVENOUS | Status: AC
  Administered 2016-11-27: 1000 mL via INTRAVENOUS

## 2016-11-27 MED ORDER — SODIUM CHLORIDE 0.9 % IV BOLUS (SEPSIS)
500.0000 mL | Freq: Once | INTRAVENOUS | Status: AC
  Administered 2016-11-27: 500 mL via INTRAVENOUS

## 2016-11-27 NOTE — ED Provider Notes (Signed)
[facility] DEPT Provider Note   CSN: [id number] Arrival date & time: 11/27/16  0326     History   Chief Complaint Chief Complaint  Patient presents with  . Palpitations    HPI Penny [name] is a 36 y.o. female.  The history is provided by the patient and medical records. No language interpreter was used.  Palpitations   Pertinent negatives include no fever, no chest pain, no abdominal pain, no nausea, no vomiting, no back pain, no dizziness, no cough and no shortness of breath.    Penny [name] is a 36 y.o. female  with a PMH of migraines and anxiety who presents to the Emergency Department complaining of feeling as if her heart is racing. Patient states that around midnight she suddenly felt her heart racing. This sensation has persisted for 3 hours now with no relief. She denies any chest pain, but does say that she feels a vague discomfort that is worse with breathing. She endorses shortness of breath only when lying flat-when asked to characterize breathing, she states that it "hurts to breathe when I lay down". No history of similar signs and symptoms. No medications taken prior to arrival for symptoms. No known cardiac history. Denies alcohol use or drug use.   Past Medical History:  Diagnosis Date  . ADHD (attention deficit hyperactivity disorder)   . Anxiety    no meds  . Depression    no meds  . Migraines    last one over 1 month ago  . Placenta previa    hx and with current pregnancy  . Preterm labor    c/s at 36 wks  . Scoliosis     Patient Active Problem List   Diagnosis Date Noted  . Uterine fibroid 01/23/2016  . Right groin pain 01/23/2016  . History of C-section 01/23/2016  . Migraines   . Anxiety   . Depression     Past Surgical History:  Procedure Laterality Date  . CESAREAN SECTION  2010   x 1 at 36 wks in [place]  . CESAREAN SECTION  February 13, 2012  . CHROMOPERTUBATION  10/26/2015   Procedure: ATTEMPTED CHROMOPERTUBATION;   Surgeon: Crawford Givens, MD;  Location: [facility];  Service: Gynecology;;  . LAPAROSCOPIC LYSIS OF ADHESIONS  10/26/2015   Procedure: LAPAROSCOPIC LYSIS OF ADHESIONS;  Surgeon: Crawford Givens, MD;  Location: [facility];  Service: Gynecology;;  . LAPAROSCOPY N/A 10/26/2015   Procedure: LAPAROSCOPY OPERATIVE WITH REMOVAL OF MISPLACED FILSHE CLIP;  Surgeon: Crawford Givens, MD;  Location: [facility];  Service: Gynecology;  Laterality: N/A;  . SVD  2003   x 1 in [place]  . TUBAL LIGATION    . WISDOM TOOTH EXTRACTION      OB History    Gravida Para Term Preterm AB Living   3 3 1 2  0 3   SAB TAB Ectopic Multiple Live Births   0 0 0 0 3       Home Medications    Prior to Admission medications   Medication Sig Start Date End Date Taking? Authorizing Provider  cyclobenzaprine (FLEXERIL) 5 MG tablet Take 5 mg by mouth 3 (three) times daily as needed for muscle spasms.   Yes Historical Provider, MD  EPINEPHrine 0.3 mg/0.3 mL IJ SOAJ injection Inject 0.3 mLs (0.3 mg total) into the muscle once. 05/31/16  Yes Nat Christen, MD  hydrochlorothiazide (HYDRODIURIL) 12.5 MG tablet Take 12.5 mg by mouth daily. 11/23/16  Yes Historical Provider, MD  naproxen sodium (  ANAPROX) 220 MG tablet Take 220 mg by mouth 2 (two) times daily with a meal.   Yes Historical Provider, MD  PRESCRIPTION MEDICATION Take 1 tablet by mouth daily.   Yes Historical Provider, MD  traMADol (ULTRAM) 50 MG tablet Take 50 mg by mouth every 6 (six) hours as needed for moderate pain.   Yes Historical Provider, MD  promethazine (PHENERGAN) 25 MG tablet Take 0.5-1 tablets (12.5-25 mg total) by mouth every 6 (six) hours as needed for nausea. Patient not taking: Reported on 11/27/2016 08/28/16   Elvera Maria, CNM    Family History Family History  Problem Relation Age of Onset  . Cancer Mother   . Hypertension Sister   . Sickle cell trait Daughter   . Asthma Son   . Asthma Paternal Grandmother   . Pseudochol deficiency Neg Hx   . Malignant  hyperthermia Neg Hx   . Anesthesia problems Neg Hx   . Hypotension Neg Hx     Social History Social History  Substance Use Topics  . Smoking status: Current Every Day Smoker    Packs/day: 0.10    Years: 16.00    Types: Cigarettes, E-cigarettes  . Smokeless tobacco: Never Used  . Alcohol use 0.0 oz/week     Comment: on occ     Allergies   Shrimp [shellfish allergy]   Review of Systems Review of Systems  Constitutional: Negative for chills and fever.  HENT: Negative for congestion.   Eyes: Negative for visual disturbance.  Respiratory: Positive for chest tightness. Negative for cough and shortness of breath.   Cardiovascular: Positive for palpitations. Negative for chest pain and leg swelling.  Gastrointestinal: Negative for abdominal pain, nausea and vomiting.  Genitourinary: Negative for dysuria.  Musculoskeletal: Negative for back pain and neck pain.  Skin: Negative for rash.  Neurological: Negative for dizziness.     Physical Exam Updated Vital Signs BP 96/65 (BP Location: Left Arm)   Pulse 99   Temp 98.3 F (36.8 C) (Oral)   Resp 16   LMP 11/11/2016   SpO2 98%   Physical Exam  Constitutional: She is oriented to person, place, and time. She appears well-developed and well-nourished. No distress.  HENT:  Head: Normocephalic and atraumatic.  Cardiovascular: Normal heart sounds.   No murmur heard. Tachycardic but regular.  Pulmonary/Chest: Effort normal and breath sounds normal. No respiratory distress. She has no wheezes. She has no rales. She exhibits no tenderness.  Abdominal: Soft. She exhibits no distension. There is no tenderness.  Musculoskeletal: She exhibits no edema.  Neurological: She is alert and oriented to person, place, and time.  Cranial Nerves II-XII grossly intact.  5/5 muscle strength in upper and lower extremities bilaterally including strong and equal grip strength and dorsiflexion/plantar flexion. Sensory to light touch normal in all  four extremities. No drift.   Skin: Skin is warm and dry.  Nursing note and vitals reviewed.    ED Treatments / Results  Labs (all labs ordered are listed, but only abnormal results are displayed) Labs Reviewed  BASIC METABOLIC PANEL - Abnormal; Notable for the following:       Result Value   Potassium 3.4 (*)    Glucose, Bld 107 (*)    All other components within normal limits  CBC WITH DIFFERENTIAL/PLATELET - Abnormal; Notable for the following:    WBC 17.7 (*)    RBC 5.70 (*)    Hemoglobin 16.5 (*)    HCT 46.8 (*)    Neutro Abs  12.9 (*)    All other components within normal limits  TSH  D-DIMER, QUANTITATIVE (NOT AT [facility])  I-STAT TROPOININ, ED    EKG  EKG Interpretation  Date/Time:  Sunday November 27 2016 03:27:41 EST Ventricular Rate:  128 PR Interval:    QRS Duration: 89 QT Interval:  317 QTC Calculation: 463 R Axis:   73 Text Interpretation:  Sinus tachycardia Probable left atrial enlargement Probable left ventricular hypertrophy Borderline T abnormalities, inferior leads Confirmed by Dina Rich  MD, COURTNEY ([id number]) on 11/27/2016 4:04:59 AM       Radiology Dg Chest 2 View  Result Date: 11/27/2016 CLINICAL DATA:  Tachycardia.  Palpitations.  Smoker. EXAM: CHEST  2 VIEW COMPARISON:  05/22/2016 FINDINGS: Normal heart size and pulmonary vascularity. No focal airspace disease or consolidation in the lungs. No blunting of costophrenic angles. No pneumothorax. Mediastinal contours appear intact. Thoracic scoliosis convex towards the right. IMPRESSION: No active cardiopulmonary disease. Electronically Signed   By: Lucienne Capers M.D.   On: 11/27/2016 04:11    Procedures Procedures (including critical care time)  Medications Ordered in ED Medications  sodium chloride 0.9 % bolus 500 mL (not administered)  sodium chloride 0.9 % bolus 1,000 mL (1,000 mLs Intravenous New Bag/Given 11/27/16 0436)     Initial Impression / Assessment and Plan / ED Course  I have  reviewed the triage vital signs and the nursing notes.  Pertinent labs & imaging results that were available during my care of the patient were reviewed by me and considered in my medical decision making (see chart for details).  Clinical Course    Maddix Cotherman Moretta is a 36 y.o. female who presents to ED for Her palpitations that began approximately 2-3 hours prior to arrival. On exam, patient is afebrile, anxious appearing with a heart rate in the 120s. Chest x-ray negative, troponin and d-dimer are negative. TSH wdl. CBC appears hemoconcentrated. EKG reviewed with attending: sinus tach. 1L IVF given. Patient reevaluated following liter of fluids, heart rate now in the 90s and patient states she feels much better. She no longer is having shortness of breath or chest tightness. No longer feels palpitations. Follow up with cardiology for Holter monitor discussed. Reasons to return to ED discussed. Increase hydration. All questions answered.  Patient discussed with Dr. Dina Rich who agrees with treatment plan.    Final Clinical Impressions(s) / ED Diagnoses   Final diagnoses:  Tachycardia    New Prescriptions New Prescriptions   No medications on file     [facility] Hyacinth Marcelli, PA-C 11/27/16 [place], MD 11/28/16 [phone]

## 2016-11-27 NOTE — ED Notes (Signed)
Pt c/o palpitations. Initial HR of 154. No dizziness or pain. Hx of anxiety/ panic attacks.

## 2016-11-27 NOTE — Discharge Instructions (Signed)
Increase hydration. Please call the cardiology clinic listed on Monday morning to schedule a follow-up appointment. You will likely need a Holter monitor for further evaluation of your palpitations today. Return to ER for returning palpitations, new or worsening symptoms, any additional concerns.

## 2016-11-27 NOTE — ED Notes (Signed)
Bed: [id number] Expected date:  Expected time:  Means of arrival:  Comments: 35yo F/ Palpatations

## 2016-12-06 ENCOUNTER — Other Ambulatory Visit: Payer: Self-pay | Admitting: Student

## 2016-12-06 ENCOUNTER — Encounter: Payer: Self-pay | Admitting: Nurse Practitioner

## 2016-12-06 ENCOUNTER — Ambulatory Visit (INDEPENDENT_AMBULATORY_CARE_PROVIDER_SITE_OTHER): Payer: Medicaid Other | Admitting: Nurse Practitioner

## 2016-12-06 VITALS — BP 120/82 | HR 108 | Ht 64.0 in | Wt 188.2 lb

## 2016-12-06 DIAGNOSIS — R002 Palpitations: Secondary | ICD-10-CM | POA: Diagnosis not present

## 2016-12-06 DIAGNOSIS — R000 Tachycardia, unspecified: Secondary | ICD-10-CM | POA: Diagnosis not present

## 2016-12-06 DIAGNOSIS — I10 Essential (primary) hypertension: Secondary | ICD-10-CM | POA: Diagnosis not present

## 2016-12-06 MED ORDER — METOPROLOL TARTRATE 25 MG PO TABS: 12.5000 mg | ORAL_TABLET | Freq: Two times a day (BID) | ORAL | 3 refills | Status: DC

## 2016-12-06 MED ORDER — METOPROLOL TARTRATE 25 MG PO TABS: 12.5000 mg | ORAL_TABLET | Freq: Two times a day (BID) | ORAL | 0 refills | Status: DC

## 2016-12-06 NOTE — Progress Notes (Signed)
Cardiology Clinic Note   Patient Name: Penny [name] Date of Encounter: 12/06/2016  Primary Care Provider:  Betsy Coder, MD Primary Cardiologist:  New   Patient Profile    36 y/o ? with a h/o anxiety, depression, migraines, ADHD, and HTN, who presents for evaluation related to sinus tachycardia.  Past Medical History    Past Medical History:  Diagnosis Date  . ADHD (attention deficit hyperactivity disorder)   . Anxiety    no meds  . Chronic back pain   . Depression    no meds  . Essential hypertension    a. Dx 06/2016.  . Migraines    last one over 1 month ago  . Overweight   . Palpitations   . Placenta previa    w/ 2 pregnancies.  . Preterm labor    c/s at 36 wks  . Scoliosis   . Sinus tachycardia    Past Surgical History:  Procedure Laterality Date  . CESAREAN SECTION  2010   x 1 at 36 wks in [place]  . CESAREAN SECTION  February 13, 2012  . CHROMOPERTUBATION  10/26/2015   Procedure: ATTEMPTED CHROMOPERTUBATION;  Surgeon: Crawford Givens, MD;  Location: [facility];  Service: Gynecology;;  . LAPAROSCOPIC LYSIS OF ADHESIONS  10/26/2015   Procedure: LAPAROSCOPIC LYSIS OF ADHESIONS;  Surgeon: Crawford Givens, MD;  Location: [facility];  Service: Gynecology;;  . LAPAROSCOPY N/A 10/26/2015   Procedure: LAPAROSCOPY OPERATIVE WITH REMOVAL OF MISPLACED FILSHE CLIP;  Surgeon: Crawford Givens, MD;  Location: [facility];  Service: Gynecology;  Laterality: N/A;  . SVD  2003   x 1 in [place]  . TUBAL LIGATION    . WISDOM TOOTH EXTRACTION      Allergies  Allergies  Allergen Reactions  . Shrimp [Shellfish Allergy] Itching and Swelling    Itching and swelling in mouth and face    History of Present Illness    36 y/o ? with a h/o anxiety, depression, ADHD, scoliosis w/ chronic back pain, migraines, and HTN (dx 06/2016).  She has no prior cardiac hx.  She lives locally with her husband and three children.  She does not routinely exercise.  She has been off of anxiety/depression and ADHD  meds x 4 yrs. She says she took herself off of them.   She has some degree of DOE @ baseline, saying that sometimes she gets short of breath if walking distances around 100 yds or if she has to walk @ a faster pace.  On 11/13/16, she developed a migraine and with that also noted that her HR was elevated. Ever since then, this has persisted with HR's often hovering around 100.  This can be associated with anxiety or panic attacks, which then drive her HR up further.  She says that she has been seen in 2 different ER's related to this and on those occasions, she c/o chest pain that was reproducible with palpation and position changes.  She was seen in the [facility] ED on 1/14 for elevated HRs and was found to be in sinus tachycardia.  Troponin and d dimer were nl.  WBC 17.7.  H/H, renal fxn, TSH nl.  K 3.4.  She was given IV fluids and HR came down to the 90's. She was subsequently d/c'd.  Of note, she started taking HCTZ for HTN about 2 days prior to that visit.  She has continued to take it since that ER visit.  She denies pnd, orthopnea, n, v, dizziness, syncope, edema, weight  gain, or early satiety.   Home Medications    Prior to Admission medications   Medication Sig Start Date End Date Taking? Authorizing Provider  cyclobenzaprine (FLEXERIL) 5 MG tablet Take 5 mg by mouth 3 (three) times daily as needed for muscle spasms.   Yes Historical Provider, MD  EPINEPHrine 0.3 mg/0.3 mL IJ SOAJ injection Inject 0.3 mLs (0.3 mg total) into the muscle once. 05/31/16  Yes Nat Christen, MD  naproxen sodium (ANAPROX) 220 MG tablet Take 220 mg by mouth 2 (two) times daily with a meal.   Yes Historical Provider, MD  promethazine (PHENERGAN) 12.5 MG tablet Take 12.5 mg by mouth every 6 (six) hours as needed for nausea or vomiting.   Yes Historical Provider, MD  topiramate (TOPAMAX) 25 MG tablet Take 25 mg by mouth 2 (two) times daily.   Yes Historical Provider, MD  traMADol (ULTRAM) 50 MG tablet Take 50 mg by mouth every 6  (six) hours as needed for moderate pain.   Yes Historical Provider, MD  metoprolol tartrate (LOPRESSOR) 25 MG tablet Take 0.5 tablets (12.5 mg total) by mouth 2 (two) times daily. 12/06/16 03/06/17  Rogelia Mire, NP    Family History    Family History  Problem Relation Age of Onset  . Cancer Mother   . CAD Mother   . Hypertension Sister   . Sickle cell trait Daughter   . Asthma Son   . Asthma Paternal Grandmother   . Pseudochol deficiency Neg Hx   . Malignant hyperthermia Neg Hx   . Anesthesia problems Neg Hx   . Hypotension Neg Hx     Social History    Social History   Social History  . Marital status: Married    Spouse name: N/A  . Number of children: N/A  . Years of education: N/A   Occupational History  . Not on file.   Social History Main Topics  . Smoking status: Current Every Day Smoker    Packs/day: 0.20    Years: 18.00    Types: Cigarettes, E-cigarettes  . Smokeless tobacco: Never Used  . Alcohol use 0.0 oz/week     Comment: rare drink  . Drug use: No  . Sexual activity: Yes    Birth control/ protection: Surgical     Comment: BTL   Other Topics Concern  . Not on file   Social History Narrative   Lives in [place] with husband and three children - ages 29, 31, 6.  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  Does not routinely exercise.     Review of Systems    General:  No chills, fever, night sweats or weight changes.  Cardiovascular:  +++ msk chest pain previously, +++ dyspnea on exertion, no edema, orthopnea, palpitations, paroxysmal nocturnal dyspnea. Dermatological: No rash, lesions/masses Respiratory: No cough, +++ dyspnea Urologic: No hematuria, dysuria Abdominal:   +++ nausea, no vomiting, diarrhea, bright red blood per rectum, melena, or hematemesis Neurologic:  No visual changes, wkns, changes in mental status. All other systems reviewed and are otherwise negative except as noted  above.  Physical Exam    VS:  BP 120/82   Pulse (!) 108   Ht 5\' 4"  (1.626 m)   Wt 188 lb 3.2 oz (85.4 kg)   LMP 11/11/2016   SpO2 96%   BMI 32.30 kg/m  , BMI Body mass index is 32.3 kg/m. GEN: Well nourished, well  developed, in no acute distress.  HEENT: normal.  Neck: Supple, no JVD, carotid bruits, or masses. Cardiac: RRR, no murmurs, rubs, or gallops. No clubbing, cyanosis, edema.  Radials/DP/PT 2+ and equal bilaterally.  Respiratory:  Respirations regular and unlabored, clear to auscultation bilaterally. GI: Soft, nontender, nondistended, BS + x 4. MS: no deformity or atrophy. Skin: warm and dry, no rash. Neuro:  Strength and sensation are intact. Psych: Normal affect.  Accessory Clinical Findings    ECG - sinus tachycardia, prob LAE, early repol, nonspecific st changes.  Assessment & Plan   1.  Sinus Tachycardia:  Pt was recently evaluated in the [facility] ED on 1/14 with complaints of racing heart.  She was found to be in sinus tachycardia.  These Ss date back to 11/13/16, when she had a migraine and first noted elevated HRs.  In the ED, troponin/d dimer/H/H/renal fxn/TSH were nl.  White count was elevated.  UA and cxr were unremarkable.  She was treated with IVF with improvement in HR's, into the 90's.  Of note, she began taking HCTZ two days prior to that visit.  Since ED visit on 1/14, she has continued to note elevated HRs.  Rate today is 108.  She appears asymptomatic but is aware of the elevated rate.  Euvolemic on exam.  I will arrange for a 2D echo to eval LV fxn and r/o structural abnormalities.  Given concern for dehydration in the ED with mild hypokalemia, I will d/c HCTZ and replace it with lopressor 12.5 bid for mgmt of HTN.  If echo nl, I would rec f/u with primary care/psych re: anxiety/depression/ADHD, as she has been off of meds x 4 yrs.  2.  Essential HTN:  BP stable on HCTZ.  Given concern for possible contribution of dehydration to tachycardia, I will d/c hctz in  favor of oral  blocker therapy.  3.  Anxiety and Depression:  I've rec f/u with PCP to resume meds.  4.  ADHD: f/u primary care to resume meds.  5.  Disposition:  F/u echo. F/u with me in 1 month.  Murray Hodgkins, NP 12/06/2016, 4:15 PM

## 2016-12-06 NOTE — Patient Instructions (Signed)
Medication Instructions:  Your physician has recommended you make the following change in your medication:  1.  STOP the Hydrochlorothiazide 2.  START the Lopressor 25 mg taking 1/2 tablet twice a day   Labwork: None ordered  Testing/Procedures: Your physician has requested that you have an echocardiogram. Echocardiography is a painless test that uses sound waves to create images of your heart. It provides your doctor with information about the size and shape of your heart and how well your heart's chambers and valves are working. This procedure takes approximately one hour. There are no restrictions for this procedure.    Follow-Up: Your physician recommends that you schedule a follow-up appointment in: [place], NP   Any Other Special Instructions Will Be Listed Below (If Applicable).     If you need a refill on your cardiac medications before your next appointment, please call your pharmacy.

## 2016-12-07 ENCOUNTER — Telehealth: Payer: Self-pay | Admitting: Nurse Practitioner

## 2016-12-07 MED ORDER — BISOPROLOL FUMARATE 5 MG PO TABS: ORAL_TABLET | ORAL | 6 refills | Status: DC

## 2016-12-07 NOTE — Telephone Encounter (Signed)
Received a call from patient.She stated after she took morning dose of metoprolol she started itching all over,heart racing,chest pain.Advised ok to take a benadryl.Advised not to take any more metoprolol.Spoke to Ignacia Bayley NP he advised stop metoprolol and restart HCTZ as prescribed.Advised to see PCP.She stated she did not want to restart HCTZ.She would like to try another medication like metoprolol.Spoke to [place] he advised ok to try bisoprolol 2.5 mg daily.Advised to keep echo appointment as planned.Advised to see PCP.

## 2016-12-07 NOTE — Telephone Encounter (Signed)
New Message   Pt c/o medication issue:  1. Name of Medication:   metoprolol tartrate (LOPRESSOR) 25 MG tablet   2. How are you currently taking this medication (dosage and times per day)? Yes, took half of pill  3. Are you having a reaction (difficulty breathing--STAT)? Per pt Unsure  4. What is your medication issue? Pt says she's itching, heart racing, chest hurting, feeling nausea

## 2016-12-20 ENCOUNTER — Emergency Department (HOSPITAL_COMMUNITY): Payer: Medicaid Other

## 2016-12-20 ENCOUNTER — Emergency Department (HOSPITAL_COMMUNITY)
Admission: EM | Admit: 2016-12-20 | Discharge: 2016-12-20 | Disposition: A | Discharge status: Home / Self Care | Payer: Medicaid Other | Attending: Emergency Medicine | Admitting: Emergency Medicine

## 2016-12-20 ENCOUNTER — Encounter (HOSPITAL_COMMUNITY): Payer: Self-pay | Admitting: Emergency Medicine

## 2016-12-20 DIAGNOSIS — F17210 Nicotine dependence, cigarettes, uncomplicated: Secondary | ICD-10-CM | POA: Diagnosis not present

## 2016-12-20 DIAGNOSIS — R002 Palpitations: Secondary | ICD-10-CM | POA: Diagnosis not present

## 2016-12-20 DIAGNOSIS — I10 Essential (primary) hypertension: Secondary | ICD-10-CM | POA: Insufficient documentation

## 2016-12-20 DIAGNOSIS — F909 Attention-deficit hyperactivity disorder, unspecified type: Secondary | ICD-10-CM | POA: Insufficient documentation

## 2016-12-20 DIAGNOSIS — Z79899 Other long term (current) drug therapy: Secondary | ICD-10-CM | POA: Diagnosis not present

## 2016-12-20 DIAGNOSIS — R0789 Other chest pain: Secondary | ICD-10-CM | POA: Diagnosis present

## 2016-12-20 LAB — CBC
HCT: 43.2 % (ref 36.0–46.0)
Hemoglobin: 15 g/dL (ref 12.0–15.0)
MCH: 28.4 pg (ref 26.0–34.0)
MCHC: 34.7 g/dL (ref 30.0–36.0)
MCV: 81.8 fL (ref 78.0–100.0)
Platelets: 369 10*3/uL (ref 150–400)
RBC: 5.28 MIL/uL — ABNORMAL HIGH (ref 3.87–5.11)
RDW: 14.1 % (ref 11.5–15.5)
WBC: 9.8 10*3/uL (ref 4.0–10.5)

## 2016-12-20 LAB — BASIC METABOLIC PANEL
Anion gap: 10 (ref 5–15)
BUN: 10 mg/dL (ref 6–20)
CO2: 20 mmol/L — ABNORMAL LOW (ref 22–32)
Calcium: 9.4 mg/dL (ref 8.9–10.3)
Chloride: 107 mmol/L (ref 101–111)
Creatinine, Ser: 0.88 mg/dL (ref 0.44–1.00)
GFR calc Af Amer: >60 mL/min (ref >60)
GFR calc non Af Amer: >60 mL/min (ref >60)
Glucose, Bld: 91 mg/dL (ref 65–99)
Potassium: 4 mmol/L (ref 3.5–5.1)
Sodium: 137 mmol/L (ref 135–145)

## 2016-12-20 LAB — I-STAT TROPONIN, ED: Troponin i, poc: 0 ng/mL (ref 0.00–0.08)

## 2016-12-20 NOTE — ED Provider Notes (Signed)
[facility] DEPT Provider Note   CSN: [id number] Arrival date & time: 12/20/16  1415     History   Chief Complaint Chief Complaint  Patient presents with  . Chest Pain    HPI Penny [name] is a 36 y.o. female.  She woke from sleep this morning, around 11 AM and noticed that she had rapid heartbeat, facial numbness, chest pressure and left arm pain. Symptoms gradually resolved over the next several hours. She came to the ED for evaluation of this problem, by bus. At the time of evaluation, she is comfortable and asymptomatic. She states that she has a planned cardiac echo to be done tomorrow. She recently saw cardiology and had her blood pressure medication changed from hydrochlorothiazide, to Lopressor. She is taking Lopressor as directed. He denies fever, chills, nausea, vomiting, shortness of breath, bowel or urine trouble. She has had mild cough since starting on Topamax, but one week ago. Topamax was prescribed for migraines. She is a smoker but trying to quit. There are no other known modifying factors.   HPI  Past Medical History:  Diagnosis Date  . ADHD (attention deficit hyperactivity disorder)   . Anxiety    no meds  . Chronic back pain   . Depression    no meds  . Essential hypertension    a. Dx 06/2016.  . Migraines    last one over 1 month ago  . Overweight   . Palpitations   . Placenta previa    w/ 2 pregnancies.  . Preterm labor    c/s at 36 wks  . Scoliosis   . Sinus tachycardia     Patient Active Problem List   Diagnosis Date Noted  . Uterine fibroid 01/23/2016  . Right groin pain 01/23/2016  . History of C-section 01/23/2016  . Migraines   . Anxiety   . Depression     Past Surgical History:  Procedure Laterality Date  . CESAREAN SECTION  2010   x 1 at 36 wks in [place]  . CESAREAN SECTION  February 13, 2012  . CHROMOPERTUBATION  10/26/2015   Procedure: ATTEMPTED CHROMOPERTUBATION;  Surgeon: Crawford Givens, MD;  Location: [facility];  Service:  Gynecology;;  . LAPAROSCOPIC LYSIS OF ADHESIONS  10/26/2015   Procedure: LAPAROSCOPIC LYSIS OF ADHESIONS;  Surgeon: Crawford Givens, MD;  Location: [facility];  Service: Gynecology;;  . LAPAROSCOPY N/A 10/26/2015   Procedure: LAPAROSCOPY OPERATIVE WITH REMOVAL OF MISPLACED FILSHE CLIP;  Surgeon: Crawford Givens, MD;  Location: [facility];  Service: Gynecology;  Laterality: N/A;  . SVD  2003   x 1 in [place]  . TUBAL LIGATION    . WISDOM TOOTH EXTRACTION      OB History    Gravida Para Term Preterm AB Living   3 3 1 2  0 3   SAB TAB Ectopic Multiple Live Births   0 0 0 0 3       Home Medications    Prior to Admission medications   Medication Sig Start Date End Date Taking? Authorizing Provider  bisoprolol (ZEBETA) 5 MG tablet Take 1/2 tablet ( 2.5 mg ) daily 12/07/16  Yes Rogelia Mire, NP  cyclobenzaprine (FLEXERIL) 5 MG tablet Take 5 mg by mouth 3 (three) times daily as needed for muscle spasms.   Yes Historical Provider, MD  hydrochlorothiazide (HYDRODIURIL) 12.5 MG tablet Take 12.5 mg by mouth daily. 11/23/16  Yes Historical Provider, MD  promethazine (PHENERGAN) 12.5 MG tablet Take 12.5 mg by mouth every  6 (six) hours as needed for nausea or vomiting.   Yes Historical Provider, MD  topiramate (TOPAMAX) 25 MG tablet Take 25 mg by mouth 2 (two) times daily.   Yes Historical Provider, MD  traMADol (ULTRAM) 50 MG tablet Take 50 mg by mouth every 6 (six) hours as needed for moderate pain.   Yes Historical Provider, MD  EPINEPHrine 0.3 mg/0.3 mL IJ SOAJ injection Inject 0.3 mLs (0.3 mg total) into the muscle once. 05/31/16   Nat Christen, MD    Family History Family History  Problem Relation Age of Onset  . Cancer Mother   . CAD Mother   . Hypertension Sister   . Sickle cell trait Daughter   . Asthma Son   . Asthma Paternal Grandmother   . Pseudochol deficiency Neg Hx   . Malignant hyperthermia Neg Hx   . Anesthesia problems Neg Hx   . Hypotension Neg Hx     Social History Social  History  Substance Use Topics  . Smoking status: Current Every Day Smoker    Packs/day: 0.20    Years: 18.00    Types: Cigarettes, E-cigarettes  . Smokeless tobacco: Never Used  . Alcohol use 0.0 oz/week     Comment: rare drink     Allergies   Shrimp [shellfish allergy]   Review of Systems Review of Systems  All other systems reviewed and are negative.    Physical Exam Updated Vital Signs BP 114/74   Pulse 80   Temp 98.1 F (36.7 C) (Oral)   Resp 18   LMP 12/12/2016   SpO2 100%   Physical Exam  Constitutional: She is oriented to person, place, and time. She appears well-developed and well-nourished. No distress.  HENT:  Head: Normocephalic and atraumatic.  Eyes: Conjunctivae and EOM are normal. Pupils are equal, round, and reactive to light.  Neck: Normal range of motion and phonation normal. Neck supple.  Cardiovascular: Normal rate and regular rhythm.   Pulmonary/Chest: Effort normal and breath sounds normal. She exhibits no tenderness.  Abdominal: Soft. She exhibits no distension. There is no tenderness. There is no guarding.  Musculoskeletal: Normal range of motion.  Neurological: She is alert and oriented to person, place, and time. She exhibits normal muscle tone.  No dysarthria and aphasia or nystagmus.  Skin: Skin is warm and dry.  Psychiatric: She has a normal mood and affect. Her behavior is normal. Judgment and thought content normal.  Nursing note and vitals reviewed.    ED Treatments / Results  Labs (all labs ordered are listed, but only abnormal results are displayed) Labs Reviewed  BASIC METABOLIC PANEL - Abnormal; Notable for the following:       Result Value   CO2 20 (*)    All other components within normal limits  CBC - Abnormal; Notable for the following:    RBC 5.28 (*)    All other components within normal limits  I-STAT TROPOININ, ED    EKG  EKG Interpretation  Date/Time:  Tuesday December 20 2016 14:28:10 EST Ventricular  Rate:  100 PR Interval:    QRS Duration: 88 QT Interval:  361 QTC Calculation: 466 R Axis:   65 Text Interpretation:  Sinus tachycardia ST elevation, consider inferior injury since last tracing no significant change Confirmed by Eulis Foster  MD, Alvine Mostafa [phone]) on 12/20/2016 9:06:43 PM       Radiology Dg Chest 2 View  Result Date: 12/20/2016 CLINICAL DATA:  Chest pain and shortness of breath EXAM: CHEST  2 VIEW COMPARISON:  11/27/2016 FINDINGS: Cardiac shadow is stable. The lungs are well aerated bilaterally. No focal infiltrate or sizable effusion is seen. Stable scoliosis of the thoracic spine is noted. No acute bony abnormality is seen. IMPRESSION: No active cardiopulmonary disease. Electronically Signed   By: Inez Catalina M.D.   On: 12/20/2016 14:59    Procedures Procedures (including critical care time)  Medications Ordered in ED Medications - No data to display   Initial Impression / Assessment and Plan / ED Course  I have reviewed the triage vital signs and the nursing notes.  Pertinent labs & imaging results that were available during my care of the patient were reviewed by me and considered in my medical decision making (see chart for details).     Medications - No data to display  Patient Vitals for the past 24 hrs:  BP Temp Temp src Pulse Resp SpO2  12/20/16 2030 114/74 - - 80 18 100 %  12/20/16 1833 132/80 - - 76 16 98 %  12/20/16 1435 - 98.1 F (36.7 C) Oral - - -  12/20/16 1423 122/86 - - 107 16 96 %    9:10 PM Reevaluation with update and discussion. After initial assessment and treatment, an updated evaluation reveals She is comfortable. Findings discussed with the patient, all questions answered. Devanshi Califf L    Final Clinical Impressions(s) / ED Diagnoses   Final diagnoses:  Palpitation   Palpitations and chest pain, with active evaluation, treatment by cardiology. ACS, PE or pneumonia. SX resolved spontaneously.  Nursing Notes Reviewed/ Care  Coordinated Applicable Imaging Reviewed Interpretation of Laboratory Data incorporated into ED treatment  The patient appears reasonably screened and/or stabilized for discharge and I doubt any other medical condition or other [facility] requiring further screening, evaluation, or treatment in the ED at this time prior to discharge.  Plan: Home Medications- continue; Home Treatments- rest; return here if the recommended treatment, does not improve the symptoms; Recommended follow up- PCP prn   New Prescriptions New Prescriptions   No medications on file     Daleen Bo, MD 12/20/16 2113

## 2016-12-20 NOTE — Discharge Instructions (Signed)
Follow-up, tomorrow for the echo, as scheduled.  Talk to your cardiologist as planned for further treatment of the palpitations

## 2016-12-20 NOTE — ED Triage Notes (Signed)
Pt reports palpitations, chest pain, sob, and dizziness since yesterday. Pt also reports numbness and tingling on bilateral face, hands, and legs.

## 2016-12-21 ENCOUNTER — Ambulatory Visit (HOSPITAL_COMMUNITY): Payer: Medicaid Other | Attending: Internal Medicine

## 2016-12-21 ENCOUNTER — Other Ambulatory Visit: Payer: Self-pay

## 2016-12-21 DIAGNOSIS — R000 Tachycardia, unspecified: Secondary | ICD-10-CM | POA: Diagnosis present

## 2016-12-21 DIAGNOSIS — R002 Palpitations: Secondary | ICD-10-CM | POA: Diagnosis not present

## 2017-01-05 ENCOUNTER — Ambulatory Visit (INDEPENDENT_AMBULATORY_CARE_PROVIDER_SITE_OTHER): Payer: Medicaid Other | Admitting: Nurse Practitioner

## 2017-01-05 ENCOUNTER — Encounter: Payer: Self-pay | Admitting: Nurse Practitioner

## 2017-01-05 VITALS — BP 114/79 | HR 94 | Ht 64.0 in | Wt 192.2 lb

## 2017-01-05 DIAGNOSIS — I10 Essential (primary) hypertension: Secondary | ICD-10-CM

## 2017-01-05 DIAGNOSIS — R000 Tachycardia, unspecified: Secondary | ICD-10-CM | POA: Diagnosis not present

## 2017-01-05 NOTE — Progress Notes (Signed)
Office Visit    Patient Name: Penny [name] Date of Encounter: 01/05/2017  Primary Care Provider:  Betsy Coder, MD Primary Cardiologist:  Pt will f/u with Dr. Claiborne Billings prn  Chief Complaint    36 year old female with history of anxiety, depression, migraines, ADHD, palpitations, and hypertension, who presents for follow-up.  Past Medical History    Past Medical History:  Diagnosis Date  . ADHD (attention deficit hyperactivity disorder)   . Anxiety    no meds  . Chronic back pain   . Depression    no meds  . Essential hypertension    a. Dx 06/2016.  . Migraines    last one over 1 month ago  . Overweight   . Palpitations - Sinus Tachycardia    a. 12/2016 Echo: E f55-60%, no rwma, nl LA/RA sizes.  . Placenta previa    w/ 2 pregnancies.  . Preterm labor    c/s at 36 wks  . Scoliosis   . Sinus tachycardia    Past Surgical History:  Procedure Laterality Date  . CESAREAN SECTION  2010   x 1 at 36 wks in [place]  . CESAREAN SECTION  February 13, 2012  . CHROMOPERTUBATION  10/26/2015   Procedure: ATTEMPTED CHROMOPERTUBATION;  Surgeon: Crawford Givens, MD;  Location: [facility];  Service: Gynecology;;  . LAPAROSCOPIC LYSIS OF ADHESIONS  10/26/2015   Procedure: LAPAROSCOPIC LYSIS OF ADHESIONS;  Surgeon: Crawford Givens, MD;  Location: [facility];  Service: Gynecology;;  . LAPAROSCOPY N/A 10/26/2015   Procedure: LAPAROSCOPY OPERATIVE WITH REMOVAL OF MISPLACED FILSHE CLIP;  Surgeon: Crawford Givens, MD;  Location: [facility];  Service: Gynecology;  Laterality: N/A;  . SVD  2003   x 1 in [place]  . TUBAL LIGATION    . WISDOM TOOTH EXTRACTION      Allergies  Allergies  Allergen Reactions  . Shrimp [Shellfish Allergy] Itching and Swelling    Itching and swelling in mouth and face    History of Present Illness    69 show female with history anxiety, depression, ADHD, scoliosis with chronic back pain, migraines, and hypertension, which was diagnosed in August 2017. I recently saw her in  clinic in late January after presentation to the ER for elevated heart rates in the setting of migraines and chest wall tenderness. Laboratory evaluation at that time was normal and she was treated with IV fluids with improvement in heart rate into the 90s. When I saw her, I discontinued her hydrochlorothiazide and initiated metoprolol therapy. She did not tolerate that, and she was switched to bisoprolol 2.5 mg daily. She did have one recurrent episode of elevated heart rates in the setting of which she describes as a panic attack. She presented back to the emergency department on February 6 in the setting of that episode and EKG showed sinus tachycardia at a rate of 100. Labs were again unrevealing. She is discharged home and underwent echocardiogram as previously planned which showed normal LV function without any significant valvular abnormalities. She has been feeling well since then and denies chest pain, dyspnea, PND, orthopnea, dizziness, syncope, edema, or early satiety. She is tolerating bisoprolol. She has not yet seen psychiatry and she isn't sure if she wants to as she says she has been on medication since second grade and just prefers to remain off of it if possible.  Home Medications    Prior to Admission medications   Medication Sig Start Date End Date Taking? Authorizing Provider  ALLZITAL 25-325 MG  TABS Take 1 tablet by mouth as directed. 12/14/16  Yes Historical Provider, MD  bisoprolol (ZEBETA) 5 MG tablet Take 1/2 tablet ( 2.5 mg ) daily 12/07/16  Yes Rogelia Mire, NP  cyclobenzaprine (FLEXERIL) 5 MG tablet Take 5 mg by mouth 3 (three) times daily as needed for muscle spasms.   Yes Historical Provider, MD  EPINEPHrine 0.3 mg/0.3 mL IJ SOAJ injection Inject 0.3 mLs (0.3 mg total) into the muscle once. 05/31/16  Yes Nat Christen, MD  promethazine (PHENERGAN) 12.5 MG tablet Take 12.5 mg by mouth every 6 (six) hours as needed for nausea or vomiting.   Yes Historical Provider, MD    topiramate (TOPAMAX) 25 MG tablet Take 25 mg by mouth 2 (two) times daily.   Yes Historical Provider, MD  traMADol (ULTRAM) 50 MG tablet Take 50 mg by mouth every 6 (six) hours as needed for moderate pain.   Yes Historical Provider, MD    Review of Systems    She continues to have anxiety.  She was recently seen in the ER with elevated blood pressure and heart rate and which she describes as a panic attack. She has not been having chest pain or dyspnea.  All other systems reviewed and are otherwise negative except as noted above.  Physical Exam    VS:  BP 114/79   Pulse 94   Ht 5\' 4"  (1.626 m)   Wt 192 lb 3.2 oz (87.2 kg)   LMP 12/12/2016   SpO2 94%   BMI 32.99 kg/m  , BMI Body mass index is 32.99 kg/m. GEN: Well nourished, well developed, in no acute distress.  HEENT: normal.  Neck: Supple, no JVD, carotid bruits, or masses. Cardiac: RRR, no murmurs, rubs, or gallops. No clubbing, cyanosis, edema.  Radials/DP/PT 2+ and equal bilaterally.  Respiratory:  Respirations regular and unlabored, clear to auscultation bilaterally. GI: Soft, nontender, nondistended, BS + x 4. MS: no deformity or atrophy. Skin: warm and dry, no rash. Neuro:  Strength and sensation are intact. Psych: Normal affect.  Accessory Clinical Findings    Lab Results  Component Value Date   WBC 9.8 12/20/2016   HGB 15.0 12/20/2016   HCT 43.2 12/20/2016   MCV 81.8 12/20/2016   PLT 369 12/20/2016    Lab Results  Component Value Date   CREATININE 0.88 12/20/2016   BUN 10 12/20/2016   NA 137 12/20/2016   K 4.0 12/20/2016   CL 107 12/20/2016   CO2 20 (L) 12/20/2016   ER ECG from 2/6 - sinus rhythm 100, early repol, no acute changes - personally reviewed.   Assessment & Plan    1.  Sinus Tachycardia:  Pt still with occasional, symptomatic sinus tachycardia, especially in the setting of anxiety.  This occurred most recently in the setting of an elevated BP and resulted in an ER visit.  We discussed her  recent echo in detail and I provided reassurance.  She is tolerating bisoprolol and BP is better today.  No changes or further w/u is warranted @ this time.  F/u prn.  2.  Essential HTN:  BP stable on zebeta.  Off of HCTZ.  No changes.  3.  Anxiety/Depression/ADHD:  She is considering f/u with Psych.  She has been on meds most of her life and prefers to remain off of them if @ all possible.  She understands that anxiety/panic attacks are playing a role in her tachycardia and HTN.  I encouraged her to discuss this further with  her counselor and PCP.  4.  Dispo:  F/u with cardiology PRN.  Murray Hodgkins, NP 01/05/2017, 2:43 PM

## 2017-01-05 NOTE — Patient Instructions (Signed)
Your physician recommends that you schedule a follow-up appointment in: AS NEEDED  

## 2017-01-10 ENCOUNTER — Encounter (HOSPITAL_COMMUNITY): Payer: Self-pay | Admitting: Emergency Medicine

## 2017-01-10 ENCOUNTER — Ambulatory Visit (HOSPITAL_COMMUNITY)
Admission: EM | Admit: 2017-01-10 | Discharge: 2017-01-10 | Disposition: A | Discharge status: Home / Self Care | Payer: Medicaid Other | Attending: Family Medicine | Admitting: Family Medicine

## 2017-01-10 DIAGNOSIS — J0100 Acute maxillary sinusitis, unspecified: Secondary | ICD-10-CM | POA: Diagnosis not present

## 2017-01-10 MED ORDER — AZITHROMYCIN 250 MG PO TABS: 250.0000 mg | ORAL_TABLET | Freq: Every day | ORAL | 0 refills | Status: DC

## 2017-01-10 MED ORDER — OSELTAMIVIR PHOSPHATE 75 MG PO CAPS: 75.0000 mg | ORAL_CAPSULE | Freq: Two times a day (BID) | ORAL | 0 refills | Status: DC

## 2017-01-10 NOTE — ED Provider Notes (Addendum)
[facility]    CSN: [id number] Arrival date & time: 01/10/17  1909     History   Chief Complaint Chief Complaint  Patient presents with  . Facial Pain    HPI Penny [name] is a 36 y.o. female.   Is a 36 year old woman who comes in complaining of right facial pain for 24 hours. Her 2 daughters come in with fever and symptoms consistent with flu. Patient is had no myalgia, otalgia, or cough. She also denies any fever or abdominal symptoms.      Past Medical History:  Diagnosis Date  . ADHD (attention deficit hyperactivity disorder)   . Anxiety    no meds  . Chronic back pain   . Depression    no meds  . Essential hypertension    a. Dx 06/2016.  . Migraines    last one over 1 month ago  . Overweight   . Palpitations - Sinus Tachycardia    a. 12/2016 Echo: E f55-60%, no rwma, nl LA/RA sizes.  . Placenta previa    w/ 2 pregnancies.  . Preterm labor    c/s at 36 wks  . Scoliosis   . Sinus tachycardia     Patient Active Problem List   Diagnosis Date Noted  . Uterine fibroid 01/23/2016  . Right groin pain 01/23/2016  . History of C-section 01/23/2016  . Migraines   . Anxiety   . Depression     Past Surgical History:  Procedure Laterality Date  . CESAREAN SECTION  2010   x 1 at 36 wks in [place]  . CESAREAN SECTION  February 13, 2012  . CHROMOPERTUBATION  10/26/2015   Procedure: ATTEMPTED CHROMOPERTUBATION;  Surgeon: Crawford Givens, MD;  Location: [facility];  Service: Gynecology;;  . LAPAROSCOPIC LYSIS OF ADHESIONS  10/26/2015   Procedure: LAPAROSCOPIC LYSIS OF ADHESIONS;  Surgeon: Crawford Givens, MD;  Location: [facility];  Service: Gynecology;;  . LAPAROSCOPY N/A 10/26/2015   Procedure: LAPAROSCOPY OPERATIVE WITH REMOVAL OF MISPLACED FILSHE CLIP;  Surgeon: Crawford Givens, MD;  Location: [facility];  Service: Gynecology;  Laterality: N/A;  . SVD  2003   x 1 in [place]  . TUBAL LIGATION    . WISDOM TOOTH EXTRACTION      OB History    Gravida Para Term  Preterm AB Living   3 3 1 2  0 3   SAB TAB Ectopic Multiple Live Births   0 0 0 0 3       Home Medications    Prior to Admission medications   Medication Sig Start Date End Date Taking? Authorizing Provider  ALLZITAL 25-325 MG TABS Take 1 tablet by mouth as directed. 12/14/16  Yes Historical Provider, MD  hydrochlorothiazide (HYDRODIURIL) 25 MG tablet Take 25 mg by mouth daily.   Yes Historical Provider, MD  topiramate (TOPAMAX) 25 MG tablet Take 25 mg by mouth 2 (two) times daily.   Yes Historical Provider, MD  azithromycin (ZITHROMAX) 250 MG tablet Take 1 tablet (250 mg total) by mouth daily. Take first 2 tablets together, then 1 every day until finished. 01/10/17   Robyn Haber, MD  oseltamivir (TAMIFLU) 75 MG capsule Take 1 capsule (75 mg total) by mouth every 12 (twelve) hours. 01/10/17   Robyn Haber, MD    Family History Family History  Problem Relation Age of Onset  . Cancer Mother   . CAD Mother   . Hypertension Sister   . Sickle cell trait Daughter   . Asthma  Son   . Asthma Paternal Grandmother   . Pseudochol deficiency Neg Hx   . Malignant hyperthermia Neg Hx   . Anesthesia problems Neg Hx   . Hypotension Neg Hx     Social History Social History  Substance Use Topics  . Smoking status: Current Every Day Smoker    Packs/day: 0.20    Years: 18.00    Types: Cigarettes, E-cigarettes  . Smokeless tobacco: Never Used  . Alcohol use 0.0 oz/week     Comment: rare drink     Allergies   Shrimp [shellfish allergy]   Review of Systems Review of Systems  Constitutional: Positive for appetite change and fatigue.  HENT: Positive for sinus pain and sinus pressure.   Respiratory: Negative.   Cardiovascular: Negative.   Genitourinary: Negative.   Neurological: Negative.      Physical Exam Triage Vital Signs ED Triage Vitals  Enc Vitals Group     BP 01/10/17 1956 124/80     Pulse Rate 01/10/17 1956 115     Resp 01/10/17 1956 16     Temp 01/10/17 1956  98.4 F (36.9 C)     Temp Source 01/10/17 1956 Oral     SpO2 01/10/17 1956 100 %     Weight --      Height --      Head Circumference --      Peak Flow --      Pain Score 01/10/17 2005 0     Pain Loc --      Pain Edu? --      Excl. in [place]? --    No data found.   Updated Vital Signs BP 124/80 (BP Location: Right Arm)   Pulse 115   Temp 98.4 F (36.9 C) (Oral)   Resp 16   LMP 12/12/2016   SpO2 100%    Physical Exam  Constitutional: She is oriented to person, place, and time. She appears well-developed and well-nourished.  HENT:  Head: Normocephalic.  Right Ear: External ear normal.  Left Ear: External ear normal.  Mouth/Throat: Oropharynx is clear and moist.  Markedly swollen right nasal passage  Eyes: Conjunctivae are normal. Pupils are equal, round, and reactive to light.  Neck: Normal range of motion. Neck supple.  Cardiovascular: Normal rate, regular rhythm and normal heart sounds.   Pulmonary/Chest: Effort normal and breath sounds normal.  Musculoskeletal: Normal range of motion.  Neurological: She is alert and oriented to person, place, and time.  Skin: Skin is warm and dry.  Nursing note and vitals reviewed.    UC Treatments / Results  Labs (all labs ordered are listed, but only abnormal results are displayed) Labs Reviewed - No data to display  EKG  EKG Interpretation None       Radiology No results found.  Procedures Procedures (including critical care time)  Medications Ordered in UC Medications - No data to display   Initial Impression / Assessment and Plan / UC Course  I have reviewed the triage vital signs and the nursing notes.  Pertinent labs & imaging results that were available during my care of the patient were reviewed by me and considered in my medical decision making (see chart for details).     Final Clinical Impressions(s) / UC Diagnoses   Final diagnoses:  Acute maxillary sinusitis, recurrence not specified    New  Prescriptions New Prescriptions   AZITHROMYCIN (ZITHROMAX) 250 MG TABLET    Take 1 tablet (250 mg total) by mouth daily. Take  first 2 tablets together, then 1 every day until finished.   OSELTAMIVIR (TAMIFLU) 75 MG CAPSULE    Take 1 capsule (75 mg total) by mouth every 12 (twelve) hours.     Robyn Haber, MD 01/10/17 2016    Robyn Haber, MD 01/10/17 2023

## 2017-01-10 NOTE — ED Triage Notes (Signed)
The patient presented to the [facility] with a complaint of sinus pain and pressure x 1 day.

## 2017-01-10 NOTE — Discharge Instructions (Signed)
Start the Tamiflu only if you develop fever, chills, and body aches

## 2017-01-28 IMAGING — DX DG CHEST 2V
2 series · 2 of 2 positions shown · non-contrast
Comparison: Chest radiograph performed 06/05/2015

CLINICAL DATA: Acute onset of generalized chest pain, nausea and
headache. Blurred vision and shaky hands. Diaphoresis. Initial
encounter.

EXAM:
CHEST  2 VIEW

[chest pa]
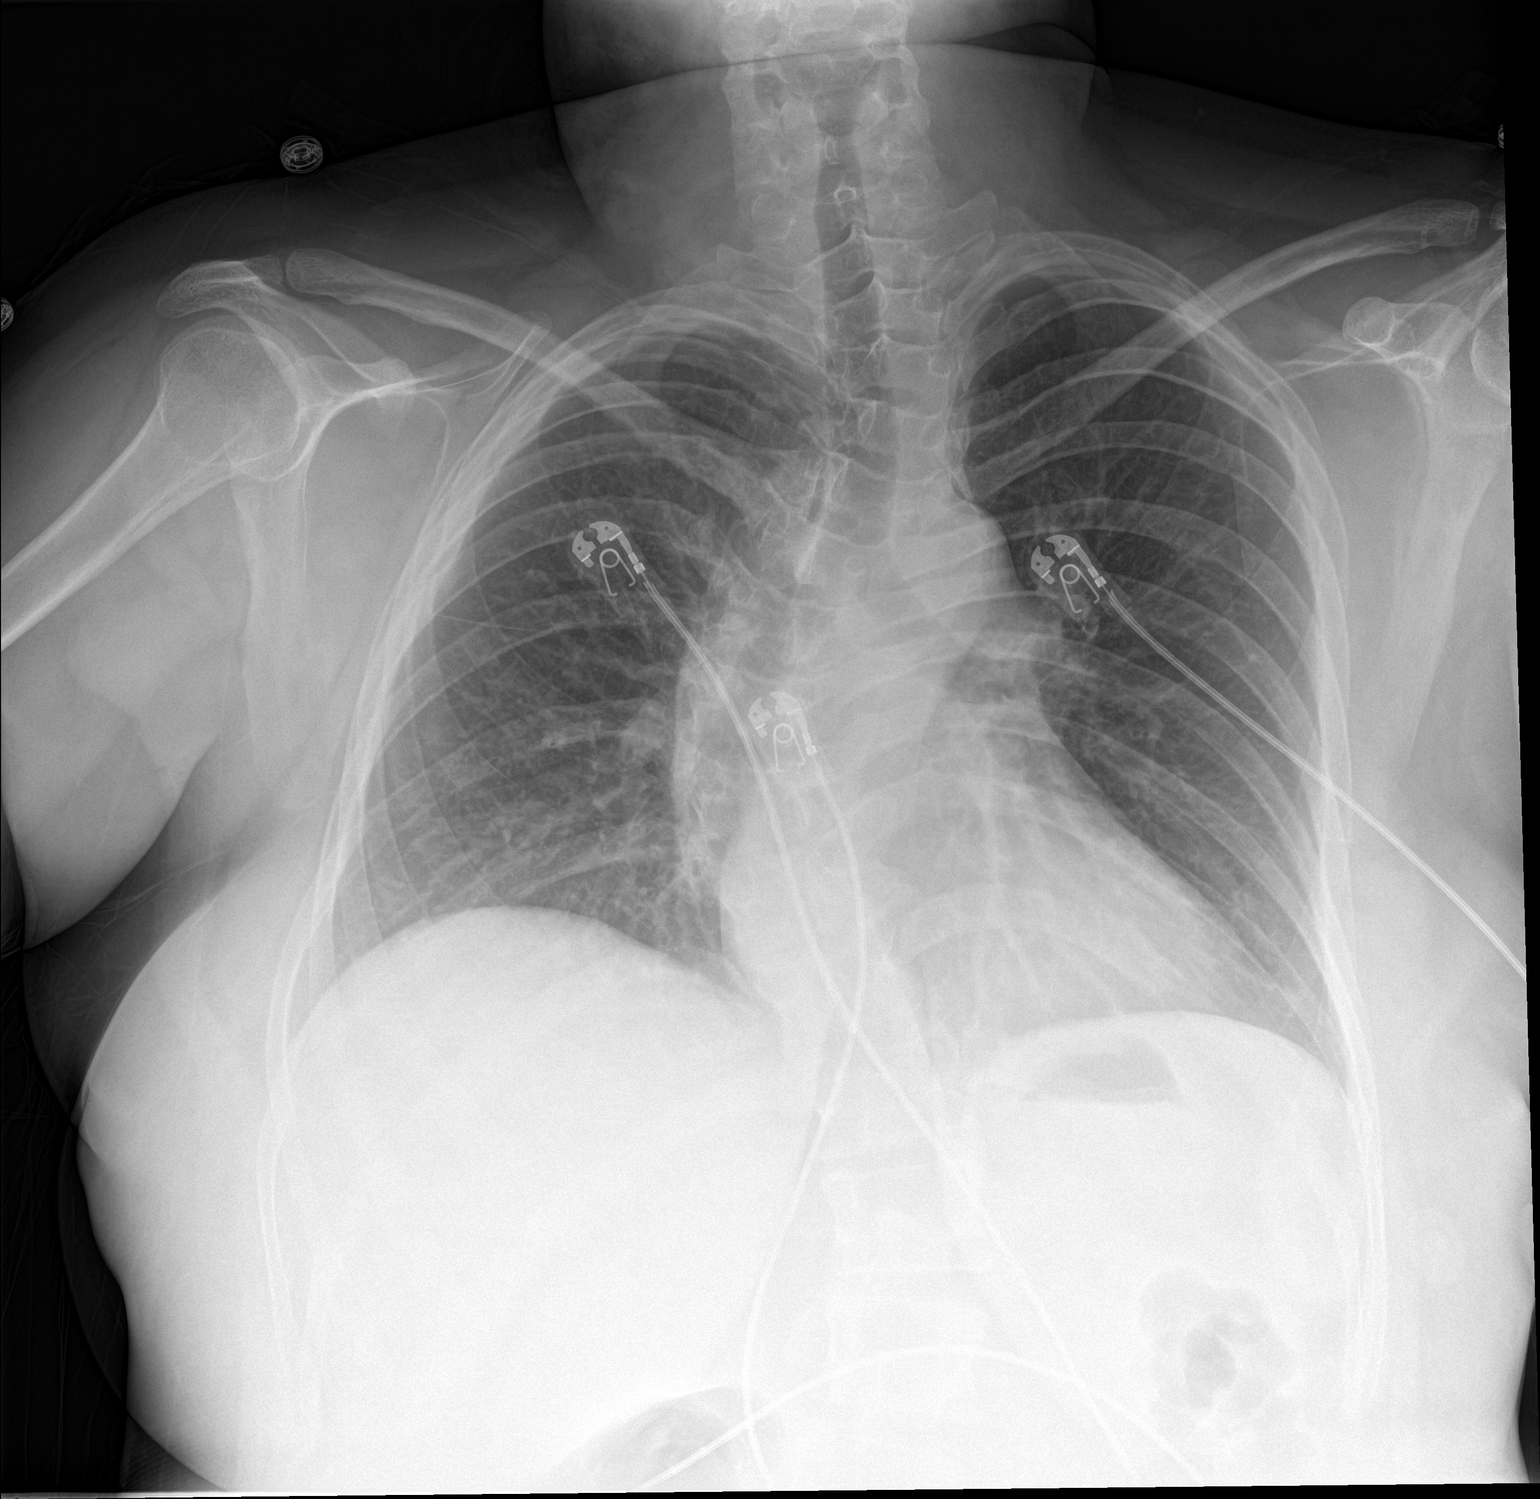

[chest lat]
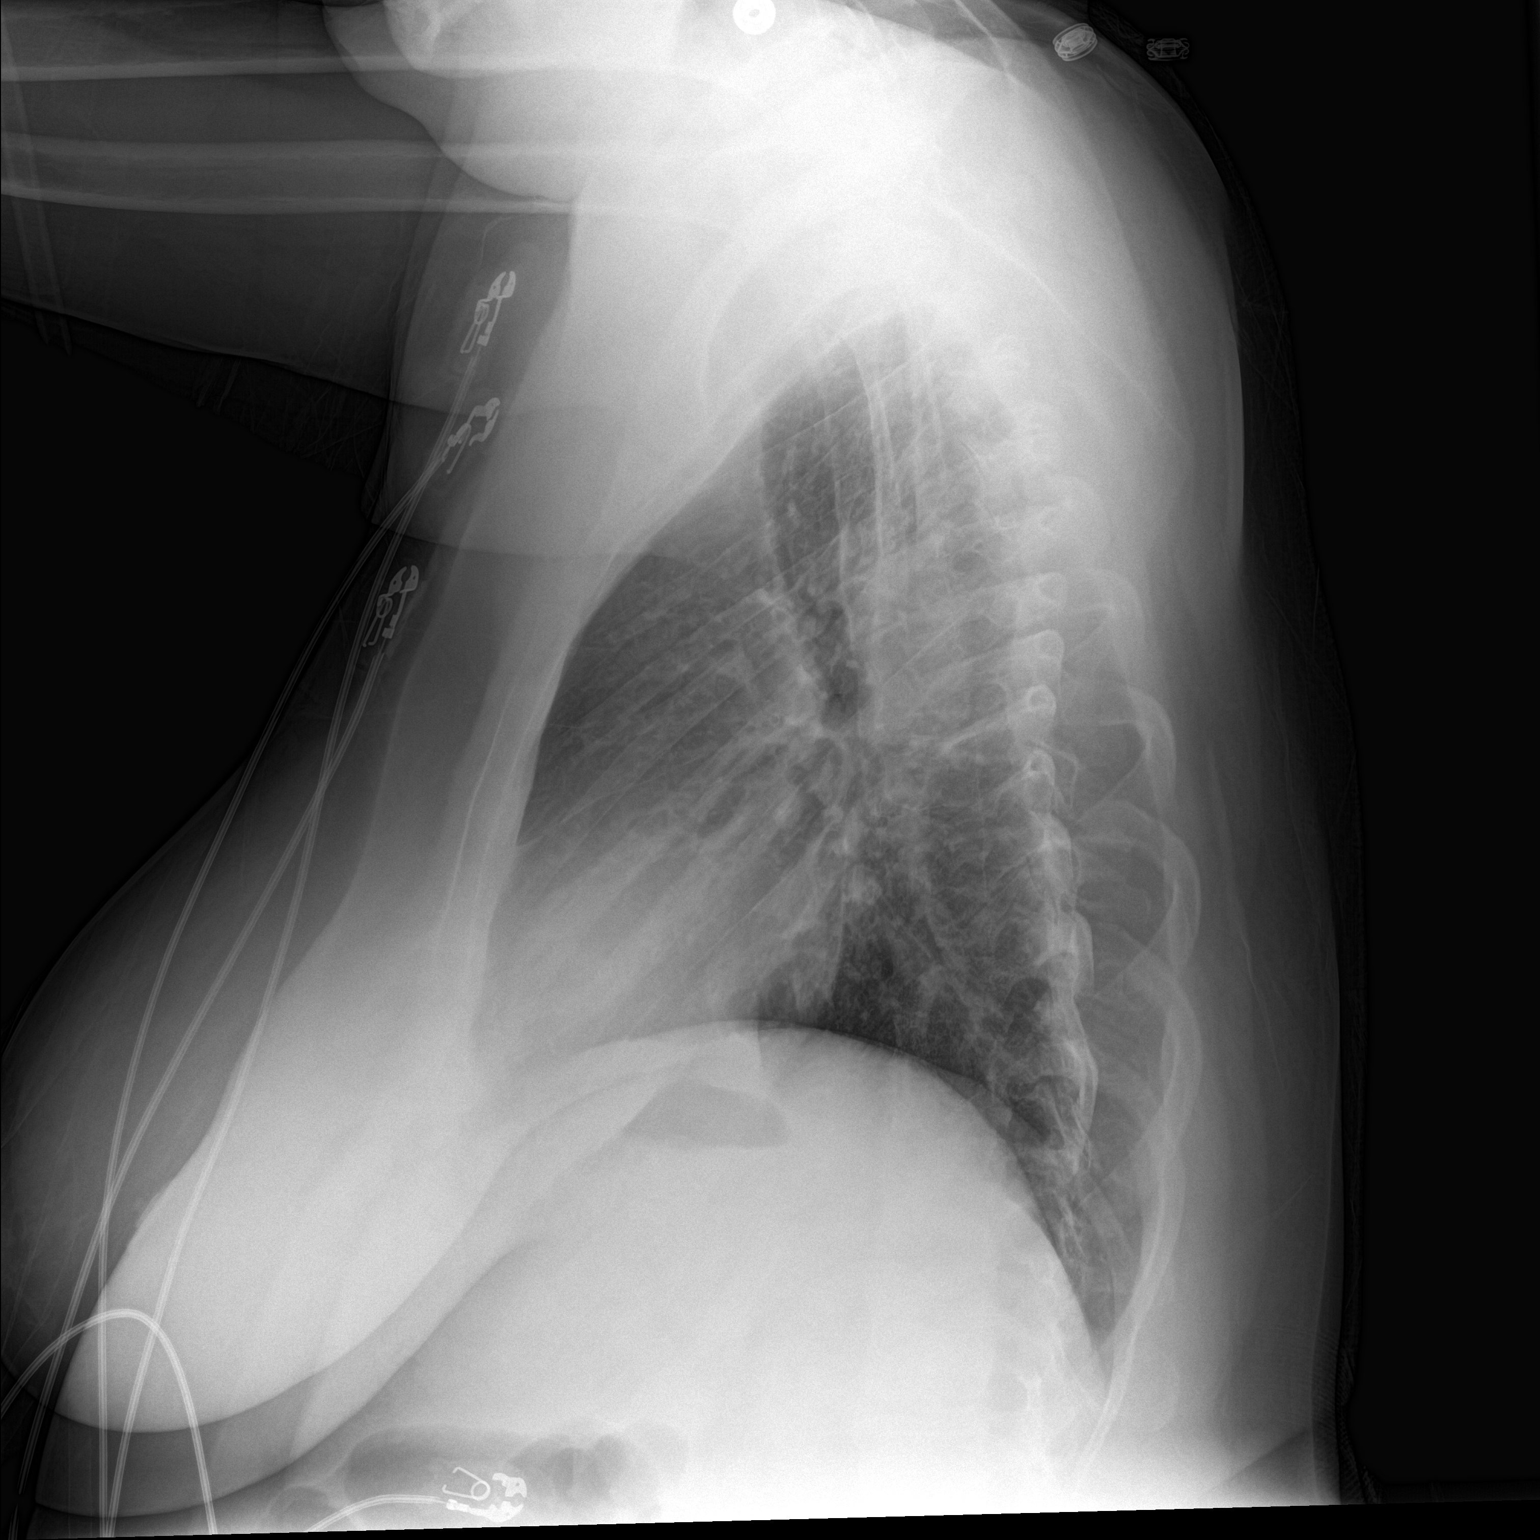

[2 of 2 positions shown; findings below may reference images not displayed]

FINDINGS: The lungs are well-aerated and clear. There is no evidence of focal
opacification, pleural effusion or pneumothorax.

The heart is normal in size; the mediastinal contour is within
normal limits. No acute osseous abnormalities are seen. Underlying
right convex thoracic scoliosis is noted.
IMPRESSION: 1. No acute cardiopulmonary process seen.
2. Underlying right convex thoracic scoliosis noted.

## 2017-02-26 ENCOUNTER — Encounter (HOSPITAL_COMMUNITY): Payer: Self-pay | Admitting: *Deleted

## 2017-02-26 ENCOUNTER — Ambulatory Visit (HOSPITAL_COMMUNITY)
Admission: EM | Admit: 2017-02-26 | Discharge: 2017-02-26 | Disposition: A | Discharge status: Home / Self Care | Payer: Medicaid Other | Attending: Family Medicine | Admitting: Family Medicine

## 2017-02-26 DIAGNOSIS — J0191 Acute recurrent sinusitis, unspecified: Secondary | ICD-10-CM | POA: Diagnosis not present

## 2017-02-26 DIAGNOSIS — R000 Tachycardia, unspecified: Secondary | ICD-10-CM | POA: Diagnosis not present

## 2017-02-26 MED ORDER — AZITHROMYCIN 250 MG PO TABS: 250.0000 mg | ORAL_TABLET | Freq: Every day | ORAL | 0 refills | Status: AC

## 2017-02-26 NOTE — Discharge Instructions (Signed)
It was nice seeing you today. I am sorry you don't feel well. Please use A/B as instructed. See [place] soon if symptoms worsens or your PCP. Use Tylenol as needed for headache. Keep well hydrated and rest at home. You may use nasal saline rinse to relieve your symptoms.

## 2017-02-26 NOTE — ED Provider Notes (Addendum)
[facility]    CSN: [id number] Arrival date & time: 02/26/17  1411     History   Chief Complaint Chief Complaint  Patient presents with  . Facial Pain    HPI Penny [name] is a 36 y.o. female.   The history is provided by the patient.  URI  Presenting symptoms: congestion, cough and fatigue   Presenting symptoms: no fever and no sore throat   Presenting symptoms comment:  Itchy nose, yellowish mucus from her nostrils worsening x 3 days. She also cough out yellowish mucus. Severity:  Moderate Onset quality:  Gradual Duration:  3 days Timing:  Constant Chronicity:  New Relieved by:  Nothing Worsened by:  Nothing Ineffective treatments:  OTC medications (benadryl, cough drops, hot tea, hot water) Associated symptoms: headaches, neck pain, sinus pain and sneezing   Associated symptoms: no wheezing   Risk factors: sick contacts   Risk factors comment:  Two of her daughters had similar presentation and now she is falling sick This is typical of her sinus infection. She has had multiple episodes of sinus infections in the past.  Past Medical History:  Diagnosis Date  . ADHD (attention deficit hyperactivity disorder)   . Anxiety    no meds  . Chronic back pain   . Depression    no meds  . Essential hypertension    a. Dx 06/2016.  . Migraines    last one over 1 month ago  . Overweight   . Palpitations - Sinus Tachycardia    a. 12/2016 Echo: E f55-60%, no rwma, nl LA/RA sizes.  . Placenta previa    w/ 2 pregnancies.  . Preterm labor    c/s at 36 wks  . Scoliosis   . Sinus tachycardia     Patient Active Problem List   Diagnosis Date Noted  . Uterine fibroid 01/23/2016  . Right groin pain 01/23/2016  . History of C-section 01/23/2016  . Migraines   . Anxiety   . Depression     Past Surgical History:  Procedure Laterality Date  . CESAREAN SECTION  2010   x 1 at 36 wks in [place]  . CESAREAN SECTION  February 13, 2012  . CHROMOPERTUBATION   10/26/2015   Procedure: ATTEMPTED CHROMOPERTUBATION;  Surgeon: Crawford Givens, MD;  Location: [facility];  Service: Gynecology;;  . LAPAROSCOPIC LYSIS OF ADHESIONS  10/26/2015   Procedure: LAPAROSCOPIC LYSIS OF ADHESIONS;  Surgeon: Crawford Givens, MD;  Location: [facility];  Service: Gynecology;;  . LAPAROSCOPY N/A 10/26/2015   Procedure: LAPAROSCOPY OPERATIVE WITH REMOVAL OF MISPLACED FILSHE CLIP;  Surgeon: Crawford Givens, MD;  Location: [facility];  Service: Gynecology;  Laterality: N/A;  . SVD  2003   x 1 in [place]  . TUBAL LIGATION    . WISDOM TOOTH EXTRACTION      OB History    Gravida Para Term Preterm AB Living   3 3 1 2  0 3   SAB TAB Ectopic Multiple Live Births   0 0 0 0 3       Home Medications    Prior to Admission medications   Medication Sig Start Date End Date Taking? Authorizing Provider  ALLZITAL 25-325 MG TABS Take 1 tablet by mouth as directed. 12/14/16  Yes Historical Provider, MD  hydrochlorothiazide (HYDRODIURIL) 25 MG tablet Take 25 mg by mouth daily.   Yes Historical Provider, MD  topiramate (TOPAMAX) 25 MG tablet Take 25 mg by mouth 2 (two) times daily.   Yes  Historical Provider, MD  azithromycin (ZITHROMAX) 250 MG tablet Take 1 tablet (250 mg total) by mouth daily. Take first 2 tablets together, then 1 every day until finished. 01/10/17   Robyn Haber, MD  oseltamivir (TAMIFLU) 75 MG capsule Take 1 capsule (75 mg total) by mouth every 12 (twelve) hours. 01/10/17   Robyn Haber, MD    Family History Family History  Problem Relation Age of Onset  . Cancer Mother   . CAD Mother   . Hypertension Sister   . Sickle cell trait Daughter   . Asthma Son   . Asthma Paternal Grandmother   . Pseudochol deficiency Neg Hx   . Malignant hyperthermia Neg Hx   . Anesthesia problems Neg Hx   . Hypotension Neg Hx     Social History Social History  Substance Use Topics  . Smoking status: Current Every Day Smoker    Packs/day: 0.20    Years: 18.00    Types: Cigarettes,  E-cigarettes  . Smokeless tobacco: Never Used  . Alcohol use Yes     Comment: rare     Allergies   Shrimp [shellfish allergy]   Review of Systems Review of Systems  Constitutional: Positive for fatigue. Negative for fever.  HENT: Positive for congestion, sinus pain and sneezing. Negative for sore throat.   Respiratory: Positive for cough. Negative for wheezing.   Musculoskeletal: Positive for neck pain.  Neurological: Positive for headaches.  All other systems reviewed and are negative.    Physical Exam Triage Vital Signs ED Triage Vitals  Enc Vitals Group     BP 02/26/17 1423 111/76     Pulse Rate 02/26/17 1423 (!) 119     Resp 02/26/17 1423 18     Temp 02/26/17 1423 98.6 F (37 C)     Temp Source 02/26/17 1423 Oral     SpO2 02/26/17 1423 100 %     Weight --      Height --      Head Circumference --      Peak Flow --      Pain Score 02/26/17 1425 8     Pain Loc --      Pain Edu? --      Excl. in [place]? --    No data found.   Updated Vital Signs BP 111/76   Pulse (!) 119   Temp 98.6 F (37 C) (Oral)   Resp 18   LMP 02/13/2017 (Exact Date)   SpO2 100%   Visual Acuity Right Eye Distance:   Left Eye Distance:   Bilateral Distance:    Right Eye Near:   Left Eye Near:    Bilateral Near:     Physical Exam  Constitutional: She appears well-developed and well-nourished.  HENT:  Head: Normocephalic.  Right Ear: Tympanic membrane and external ear normal.  Left Ear: Tympanic membrane, external ear and ear canal normal.  Nose: Right sinus exhibits maxillary sinus tenderness and frontal sinus tenderness. Left sinus exhibits maxillary sinus tenderness and frontal sinus tenderness.  Mouth/Throat: Oropharynx is clear and moist. No oropharyngeal exudate.  Eyes: Pupils are equal, round, and reactive to light. Right eye exhibits no discharge. Left eye exhibits no discharge.  Neck: Normal range of motion.  Cardiovascular: Normal rate and regular rhythm.   Mildly  tachy  Pulmonary/Chest: Effort normal and breath sounds normal. No respiratory distress. She has no wheezes.  Lymphadenopathy:    She has no cervical adenopathy.  Nursing note and vitals reviewed.  UC Treatments / Results  Labs (all labs ordered are listed, but only abnormal results are displayed) Labs Reviewed - No data to display  EKG  EKG Interpretation None       Radiology No results found.  Procedures Procedures (including critical care time)  Medications Ordered in UC Medications - No data to display   Initial Impression / Assessment and Plan / UC Course  I have reviewed the triage vital signs and the nursing notes.  Pertinent labs & imaging results that were available during my care of the patient were reviewed by me and considered in my medical decision making (see chart for details).  Clinical Course as of Feb 26 1510  Sun Feb 26, 2017  1508 Acute recurrent bacterial sinusitis ZPak prescribed. Use nasal saline rinse for congestion. F/U with PCP soon if no improvement.  [KE]  1509 Tachycardia She denies any symptoms. Might be related to her sinus infection. F/U with PCP soon for reassessment.  [KE]    Clinical Course User Index [KE] Kinnie Feil, MD      Final Clinical Impressions(s) / UC Diagnoses   Final diagnoses:  None   Acute recurrent sinusitis, unspecified location  Tachycardia   New Prescriptions New Prescriptions   No medications on file         Kinnie Feil, MD 02/26/17 1857

## 2017-02-26 NOTE — ED Triage Notes (Signed)
C/O facial pressure and pain with congestion, HA x 2 days.

## 2017-03-13 ENCOUNTER — Emergency Department (HOSPITAL_COMMUNITY)
Admission: EM | Admit: 2017-03-13 | Discharge: 2017-03-13 | Disposition: A | Discharge status: Home / Self Care | Payer: Medicaid Other | Attending: Emergency Medicine | Admitting: Emergency Medicine

## 2017-03-13 ENCOUNTER — Encounter (HOSPITAL_COMMUNITY): Payer: Self-pay | Admitting: Emergency Medicine

## 2017-03-13 ENCOUNTER — Emergency Department (HOSPITAL_COMMUNITY): Payer: Medicaid Other

## 2017-03-13 DIAGNOSIS — N83201 Unspecified ovarian cyst, right side: Secondary | ICD-10-CM | POA: Diagnosis not present

## 2017-03-13 DIAGNOSIS — F909 Attention-deficit hyperactivity disorder, unspecified type: Secondary | ICD-10-CM | POA: Diagnosis not present

## 2017-03-13 DIAGNOSIS — F17210 Nicotine dependence, cigarettes, uncomplicated: Secondary | ICD-10-CM | POA: Diagnosis not present

## 2017-03-13 DIAGNOSIS — I10 Essential (primary) hypertension: Secondary | ICD-10-CM | POA: Insufficient documentation

## 2017-03-13 DIAGNOSIS — R1031 Right lower quadrant pain: Secondary | ICD-10-CM | POA: Diagnosis present

## 2017-03-13 DIAGNOSIS — N83209 Unspecified ovarian cyst, unspecified side: Secondary | ICD-10-CM

## 2017-03-13 LAB — I-STAT CG4 LACTIC ACID, ED
Lactic Acid, Venous: 4.41 mmol/L (ref 0.5–1.9)
Lactic Acid, Venous: 1.7 mmol/L (ref 0.5–1.9)

## 2017-03-13 LAB — CBC
HCT: 46 % (ref 36.0–46.0)
Hemoglobin: 16.4 g/dL — ABNORMAL HIGH (ref 12.0–15.0)
MCH: 29.4 pg (ref 26.0–34.0)
MCHC: 35.7 g/dL (ref 30.0–36.0)
MCV: 82.6 fL (ref 78.0–100.0)
Platelets: 424 10*3/uL — ABNORMAL HIGH (ref 150–400)
RBC: 5.57 MIL/uL — ABNORMAL HIGH (ref 3.87–5.11)
RDW: 13.6 % (ref 11.5–15.5)
WBC: 25.2 10*3/uL — ABNORMAL HIGH (ref 4.0–10.5)

## 2017-03-13 LAB — URINALYSIS, ROUTINE W REFLEX MICROSCOPIC
Bilirubin Urine: NEGATIVE
Glucose, UA: NEGATIVE mg/dL
Ketones, ur: 20 mg/dL — AB
Leukocytes, UA: NEGATIVE
Nitrite: NEGATIVE
Protein, ur: 30 mg/dL — AB
Specific Gravity, Urine: 1.028 (ref 1.005–1.030)
pH: 5 (ref 5.0–8.0)

## 2017-03-13 LAB — COMPREHENSIVE METABOLIC PANEL
ALT: 21 U/L (ref 14–54)
AST: 35 U/L (ref 15–41)
Albumin: 4.7 g/dL (ref 3.5–5.0)
Alkaline Phosphatase: 72 U/L (ref 38–126)
Anion gap: 13 (ref 5–15)
BUN: 19 mg/dL (ref 6–20)
CO2: 20 mmol/L — ABNORMAL LOW (ref 22–32)
Calcium: 9.5 mg/dL (ref 8.9–10.3)
Chloride: 104 mmol/L (ref 101–111)
Creatinine, Ser: 0.93 mg/dL (ref 0.44–1.00)
GFR calc Af Amer: >60 mL/min (ref >60)
GFR calc non Af Amer: >60 mL/min (ref >60)
Glucose, Bld: 134 mg/dL — ABNORMAL HIGH (ref 65–99)
Potassium: 3.5 mmol/L (ref 3.5–5.1)
Sodium: 137 mmol/L (ref 135–145)
Total Bilirubin: 1.2 mg/dL (ref 0.3–1.2)
Total Protein: 9 g/dL — ABNORMAL HIGH (ref 6.5–8.1)

## 2017-03-13 LAB — LIPASE, BLOOD: Lipase: 27 U/L (ref 11–51)

## 2017-03-13 LAB — PREGNANCY, URINE: Preg Test, Ur: NEGATIVE

## 2017-03-13 MED ORDER — IBUPROFEN 600 MG PO TABS: 600.0000 mg | ORAL_TABLET | Freq: Four times a day (QID) | ORAL | 0 refills | Status: DC | PRN

## 2017-03-13 MED ORDER — ONDANSETRON HCL 4 MG/2ML IJ SOLN: 4.0000 mg | Freq: Once | INTRAMUSCULAR | Status: DC

## 2017-03-13 MED ORDER — IOPAMIDOL (ISOVUE-300) INJECTION 61%
INTRAVENOUS | Status: AC
  Administered 2017-03-13: 100 mL via INTRAVENOUS
  Filled 2017-03-13: qty 100

## 2017-03-13 MED ORDER — SODIUM CHLORIDE 0.9 % IV BOLUS (SEPSIS)
2000.0000 mL | Freq: Once | INTRAVENOUS | Status: AC
  Administered 2017-03-13: 2000 mL via INTRAVENOUS

## 2017-03-13 MED ORDER — IOPAMIDOL (ISOVUE-300) INJECTION 61%
100.0000 mL | Freq: Once | INTRAVENOUS | Status: AC | PRN
  Administered 2017-03-13: 100 mL via INTRAVENOUS

## 2017-03-13 MED ORDER — SODIUM CHLORIDE 0.9 % IV BOLUS (SEPSIS)
1000.0000 mL | Freq: Once | INTRAVENOUS | Status: AC
  Administered 2017-03-13: 1000 mL via INTRAVENOUS

## 2017-03-13 MED ORDER — MORPHINE SULFATE (PF) 4 MG/ML IV SOLN
4.0000 mg | Freq: Once | INTRAVENOUS | Status: AC
  Administered 2017-03-13: 4 mg via INTRAVENOUS
  Filled 2017-03-13: qty 1

## 2017-03-13 MED ORDER — PROMETHAZINE HCL 25 MG/ML IJ SOLN
25.0000 mg | Freq: Once | INTRAMUSCULAR | Status: AC
  Administered 2017-03-13: 25 mg via INTRAVENOUS
  Filled 2017-03-13: qty 1

## 2017-03-13 MED ORDER — PROMETHAZINE HCL 25 MG PO TABS: 25.0000 mg | ORAL_TABLET | Freq: Four times a day (QID) | ORAL | 0 refills | Status: DC | PRN

## 2017-03-13 NOTE — Discharge Instructions (Signed)
Please read and follow all provided instructions.  Your diagnoses today include:  1. Rupture of ovarian cyst     Tests performed today include: Vital signs. See below for your results today.   Medications prescribed:  Take as prescribed   Home care instructions:  Follow any educational materials contained in this packet.  Follow-up instructions: Please follow-up with your primary care provider for further evaluation of symptoms and treatment   Return instructions:  Please return to the Emergency Department if you do not get better, if you get worse, or new symptoms OR  - Fever (temperature greater than 101.16F)  - Bleeding that does not stop with holding pressure to the area    -Severe pain (please note that you may be more sore the day after your accident)  - Chest Pain  - Difficulty breathing  - Severe nausea or vomiting  - Inability to tolerate food and liquids  - Passing out  - Skin becoming red around your wounds  - Change in mental status (confusion or lethargy)  - New numbness or weakness    Please return if you have any other emergent concerns.  Additional Information:  Your vital signs today were: BP 115/67 (BP Location: Right Arm)    Pulse 96    Temp 97.6 F (36.4 C) (Oral)    Resp 20    LMP 02/13/2017 (Exact Date) Comment: negative urine prgnancy test 03/13/17   SpO2 96%  If your blood pressure (BP) was elevated above 135/85 this visit, please have this repeated by your doctor within one month. ---------------

## 2017-03-13 NOTE — ED Provider Notes (Signed)
[facility] DEPT Provider Note   CSN: [id number] Arrival date & time: 03/13/17  0044 By signing my name below, I, Gaspar Cola, attest that this documentation has been prepared under the direction and in the presence of non physician provider, Shary Decamp, PA-C Electronically Signed: Gaspar Cola, Scribe. 03/13/2017. 1:24 AM  History   Chief Complaint Chief Complaint  Patient presents with  . Abdominal Pain    epigastric and lower abd   . Nausea    x 1 day    HPI Penny [name] is a 36 y.o. female who presents to the Emergency Department complaining of sudden onset, moderate, progressively worsening epigastric and RLQ abdominal pain onset 5 PM tonight. Pt states pain began directly after eating a bowl of rice and smoking half a cigarette. Notes pain mainly in RLQ thought as initially onset. Epigastric pain occurred after emesis. She states vomiting began immediately after with "yellow stuff". She also notes PO intake of Gatorade, water, and rice without ability to keep down. Pain is exacerbated when laying down. Notes normal BMs and passing flatus. States pain feels like a cramping sensation and rates 6/10. Denies diarrhea. Pt notes she isn't sure if pregnant. LMP was April 2nd and she has never felt this type of pain before. States menses is oncoming. Denies CP/SOB. No vaginal bleeding. No discharge. No other symptoms noted.   The history is provided by the patient. No language interpreter was used.    Past Medical History:  Diagnosis Date  . ADHD (attention deficit hyperactivity disorder)   . Anxiety    no meds  . Chronic back pain   . Depression    no meds  . Essential hypertension    a. Dx 06/2016.  . Migraines    last one over 1 month ago  . Overweight   . Palpitations - Sinus Tachycardia    a. 12/2016 Echo: E f55-60%, no rwma, nl LA/RA sizes.  . Placenta previa    w/ 2 pregnancies.  . Preterm labor    c/s at 36 wks  . Scoliosis   . Sinus tachycardia      Patient Active Problem List   Diagnosis Date Noted  . Uterine fibroid 01/23/2016  . Right groin pain 01/23/2016  . History of C-section 01/23/2016  . Migraines   . Anxiety   . Depression     Past Surgical History:  Procedure Laterality Date  . CESAREAN SECTION  2010   x 1 at 36 wks in [place]  . CESAREAN SECTION  February 13, 2012  . CHROMOPERTUBATION  10/26/2015   Procedure: ATTEMPTED CHROMOPERTUBATION;  Surgeon: Crawford Givens, MD;  Location: [facility];  Service: Gynecology;;  . LAPAROSCOPIC LYSIS OF ADHESIONS  10/26/2015   Procedure: LAPAROSCOPIC LYSIS OF ADHESIONS;  Surgeon: Crawford Givens, MD;  Location: [facility];  Service: Gynecology;;  . LAPAROSCOPY N/A 10/26/2015   Procedure: LAPAROSCOPY OPERATIVE WITH REMOVAL OF MISPLACED FILSHE CLIP;  Surgeon: Crawford Givens, MD;  Location: [facility];  Service: Gynecology;  Laterality: N/A;  . SVD  2003   x 1 in [place]  . TUBAL LIGATION    . WISDOM TOOTH EXTRACTION      OB History    Gravida Para Term Preterm AB Living   3 3 1 2  0 3   SAB TAB Ectopic Multiple Live Births   0 0 0 0 3       Home Medications    Prior to Admission medications   Medication Sig Start Date End Date  Taking? Authorizing Provider  ALLZITAL 25-325 MG TABS Take 1 tablet by mouth as directed. 12/14/16   Historical Provider, MD  hydrochlorothiazide (HYDRODIURIL) 25 MG tablet Take 25 mg by mouth daily.    Historical Provider, MD  oseltamivir (TAMIFLU) 75 MG capsule Take 1 capsule (75 mg total) by mouth every 12 (twelve) hours. 01/10/17   Robyn Haber, MD  topiramate (TOPAMAX) 25 MG tablet Take 25 mg by mouth 2 (two) times daily.    Historical Provider, MD    Family History Family History  Problem Relation Age of Onset  . Cancer Mother   . CAD Mother   . Hypertension Sister   . Sickle cell trait Daughter   . Asthma Son   . Asthma Paternal Grandmother   . Pseudochol deficiency Neg Hx   . Malignant hyperthermia Neg Hx   . Anesthesia problems Neg Hx   .  Hypotension Neg Hx     Social History Social History  Substance Use Topics  . Smoking status: Current Every Day Smoker    Packs/day: 0.20    Years: 18.00    Types: Cigarettes, E-cigarettes  . Smokeless tobacco: Never Used  . Alcohol use Yes     Comment: rare     Allergies   Shrimp [shellfish allergy]   Review of Systems Review of Systems 10 Systems reviewed and are negative for acute change except as noted in the HPI.  Physical Exam Updated Vital Signs BP 111/78 (BP Location: Right Arm)   Pulse (!) 110   Temp 97.5 F (36.4 C) (Oral)   Resp 16   LMP 02/13/2017 (Exact Date)   SpO2 96%   Physical Exam  Constitutional: She is oriented to person, place, and time. Vital signs are normal. She appears well-developed and well-nourished.  NAD  HENT:  Head: Normocephalic and atraumatic.  Right Ear: Hearing normal.  Left Ear: Hearing normal.  Eyes: Conjunctivae and EOM are normal. Pupils are equal, round, and reactive to light.  Neck: Normal range of motion. Neck supple.  Cardiovascular: Regular rhythm, normal heart sounds and intact distal pulses.  Tachycardia present.   Pulmonary/Chest: Effort normal and breath sounds normal.  Abdominal: Soft. Normal appearance and bowel sounds are normal. There is tenderness in the right lower quadrant and suprapubic area. There is no rigidity, no rebound, no guarding, no CVA tenderness, no tenderness at McBurney's point and negative Murphy's sign.  Abdomen soft  Musculoskeletal: Normal range of motion.  Neurological: She is alert and oriented to person, place, and time.  Skin: Skin is warm and dry.  Psychiatric: She has a normal mood and affect. Her speech is normal and behavior is normal. Thought content normal.  Nursing note and vitals reviewed.  ED Treatments / Results  DIAGNOSTIC STUDIES:  Oxygen Saturation is 96% on RA, adequate by my interpretation.    COORDINATION OF CARE:  12:59 AM Discussed treatment plan with pt at  bedside and pt agreed to plan.  Labs (all labs ordered are listed, but only abnormal results are displayed) Labs Reviewed  CBC - Abnormal; Notable for the following:       Result Value   WBC 25.2 (*)    RBC 5.57 (*)    Hemoglobin 16.4 (*)    Platelets 424 (*)    All other components within normal limits  COMPREHENSIVE METABOLIC PANEL - Abnormal; Notable for the following:    CO2 20 (*)    Glucose, Bld 134 (*)    Total Protein 9.0 (*)  All other components within normal limits  URINALYSIS, ROUTINE W REFLEX MICROSCOPIC - Abnormal; Notable for the following:    Color, Urine AMBER (*)    APPearance CLOUDY (*)    Hgb urine dipstick MODERATE (*)    Ketones, ur 20 (*)    Protein, ur 30 (*)    Bacteria, UA FEW (*)    Squamous Epithelial / LPF TOO NUMEROUS TO COUNT (*)    All other components within normal limits  I-STAT CG4 LACTIC ACID, ED - Abnormal; Notable for the following:    Lactic Acid, Venous 4.41 (*)    All other components within normal limits  LIPASE, BLOOD  PREGNANCY, URINE  I-STAT CG4 LACTIC ACID, ED    EKG  EKG Interpretation None      Radiology US Transvaginal Non-ob  Result Date: 03/13/2017 CLINICAL DATA:  Nausea and vomiting since last night. Lower abdominal pain. EXAM: TRANSABDOMINAL AND TRANSVAGINAL ULTRASOUND OF PELVIS DOPPLER ULTRASOUND OF OVARIES TECHNIQUE: Both transabdominal and transvaginal ultrasound examinations of the pelvis were performed. Transabdominal technique was performed for global imaging of the pelvis including uterus, ovaries, adnexal regions, and pelvic cul-de-sac. It was necessary to proceed with endovaginal exam following the transabdominal exam to visualize the ovaries. Color and duplex Doppler ultrasound was utilized to evaluate blood flow to the ovaries. COMPARISON:  08/28/2016 FINDINGS: Uterus Measurements: 8.9 x 4.8 x 5.8 cm. No fibroids or other mass visualized. Endometrium Thickness: 13 mm.  No focal abnormality visualized. Right  ovary Measurements: 4.2 x 3.4 x 2.4 cm. Normal appearance/no adnexal mass. Left ovary Measurements: 4.0 x 2.1 x 2.7 cm. Normal appearance/no adnexal mass. Pulsed Doppler evaluation of both ovaries demonstrates normal low-resistance arterial and venous waveforms. Other findings Small volume free pelvic fluid. IMPRESSION: Normal uterus and ovaries. Small volume free fluid collected in the cul-de-sac. Intact ovarian perfusion on Doppler. Electronically Signed   By: Andreas Newport M.D.   On: 03/13/2017 03:21   US Pelvis Complete  Result Date: 03/13/2017 CLINICAL DATA:  Nausea and vomiting since last night. Lower abdominal pain. EXAM: TRANSABDOMINAL AND TRANSVAGINAL ULTRASOUND OF PELVIS DOPPLER ULTRASOUND OF OVARIES TECHNIQUE: Both transabdominal and transvaginal ultrasound examinations of the pelvis were performed. Transabdominal technique was performed for global imaging of the pelvis including uterus, ovaries, adnexal regions, and pelvic cul-de-sac. It was necessary to proceed with endovaginal exam following the transabdominal exam to visualize the ovaries. Color and duplex Doppler ultrasound was utilized to evaluate blood flow to the ovaries. COMPARISON:  08/28/2016 FINDINGS: Uterus Measurements: 8.9 x 4.8 x 5.8 cm. No fibroids or other mass visualized. Endometrium Thickness: 13 mm.  No focal abnormality visualized. Right ovary Measurements: 4.2 x 3.4 x 2.4 cm. Normal appearance/no adnexal mass. Left ovary Measurements: 4.0 x 2.1 x 2.7 cm. Normal appearance/no adnexal mass. Pulsed Doppler evaluation of both ovaries demonstrates normal low-resistance arterial and venous waveforms. Other findings Small volume free pelvic fluid. IMPRESSION: Normal uterus and ovaries. Small volume free fluid collected in the cul-de-sac. Intact ovarian perfusion on Doppler. Electronically Signed   By: Andreas Newport M.D.   On: 03/13/2017 03:21   Ct Abdomen Pelvis W Contrast  Result Date: 03/13/2017 CLINICAL DATA:   Intermittent lower abdominal pain for 2 weeks, now with nausea and vomiting. History of lysis of adhesions, hypertension. Assess for appendicitis EXAM: CT ABDOMEN AND PELVIS WITH CONTRAST TECHNIQUE: Multidetector CT imaging of the abdomen and pelvis was performed using the standard protocol following bolus administration of intravenous contrast. CONTRAST:  100 cc Isovue 300 COMPARISON:  Pelvic ultrasound March 13, 2017 at 0218 hours and CT abdomen and pelvis June 19, 2015 FINDINGS: LOWER CHEST: Lung bases are clear. Included heart size is normal. No pericardial effusion. HEPATOBILIARY: Liver is diffusely mildly hypodense compatible with steatosis, otherwise unremarkable. Normal gallbladder. PANCREAS: Normal. SPLEEN: Normal. ADRENALS/URINARY TRACT: Kidneys are orthotopic, demonstrating symmetric enhancement. No nephrolithiasis, hydronephrosis or solid renal masses. 10 mm benign-appearing cyst upper pole LEFT kidney. The unopacified ureters are normal in course and caliber. Urinary bladder is partially distended and unremarkable. Normal adrenal glands. STOMACH/BOWEL: The stomach, small and large bowel are normal in course and caliber without inflammatory changes. Normal appendix. VASCULAR/LYMPHATIC: Aortoiliac vessels are normal in course and caliber. No lymphadenopathy by CT size criteria. REPRODUCTIVE: Normal. OTHER: 16 mm collapsing RIGHT adnexal cyst with small amount of fluid in the pelvis. Solitary LEFT adnexal cyst as seen on prior CT, status post IUD removal. Enhancing uterine fundal 16 mm intramural to subserosal leiomyoma. MUSCULOSKELETAL: Nonacute. Thoracolumbar levoscoliosis. Small fat containing umbilical hernia. IMPRESSION: Collapsing RIGHT adnexal corpus luteal cyst with small amount of free fluid in the pelvis. Normal appendix. Single LEFT tubal ligation clip. Electronically Signed   By: Elon Alas M.D.   On: 03/13/2017 04:26   [place] Art/ven Flow Abd Pelv Doppler  Result Date:  03/13/2017 CLINICAL DATA:  Nausea and vomiting since last night. Lower abdominal pain. EXAM: TRANSABDOMINAL AND TRANSVAGINAL ULTRASOUND OF PELVIS DOPPLER ULTRASOUND OF OVARIES TECHNIQUE: Both transabdominal and transvaginal ultrasound examinations of the pelvis were performed. Transabdominal technique was performed for global imaging of the pelvis including uterus, ovaries, adnexal regions, and pelvic cul-de-sac. It was necessary to proceed with endovaginal exam following the transabdominal exam to visualize the ovaries. Color and duplex Doppler ultrasound was utilized to evaluate blood flow to the ovaries. COMPARISON:  08/28/2016 FINDINGS: Uterus Measurements: 8.9 x 4.8 x 5.8 cm. No fibroids or other mass visualized. Endometrium Thickness: 13 mm.  No focal abnormality visualized. Right ovary Measurements: 4.2 x 3.4 x 2.4 cm. Normal appearance/no adnexal mass. Left ovary Measurements: 4.0 x 2.1 x 2.7 cm. Normal appearance/no adnexal mass. Pulsed Doppler evaluation of both ovaries demonstrates normal low-resistance arterial and venous waveforms. Other findings Small volume free pelvic fluid. IMPRESSION: Normal uterus and ovaries. Small volume free fluid collected in the cul-de-sac. Intact ovarian perfusion on Doppler. Electronically Signed   By: Andreas Newport M.D.   On: 03/13/2017 03:21    Procedures Procedures (including critical care time)  CRITICAL CARE Performed by: Ozella Rocks   Total critical care time: 35 minutes  Critical care time was exclusive of separately billable procedures and treating other patients.  Critical care was necessary to treat or prevent imminent or life-threatening deterioration.  Critical care was time spent personally by me on the following activities: development of treatment plan with patient and/or surrogate as well as nursing, discussions with consultants, evaluation of patient's response to treatment, examination of patient, obtaining history from patient or  surrogate, ordering and performing treatments and interventions, ordering and review of laboratory studies, ordering and review of radiographic studies, pulse oximetry and re-evaluation of patient's condition.   Medications Ordered in ED Medications  sodium chloride 0.9 % bolus 1,000 mL (0 mLs Intravenous Stopped 03/13/17 0439)  morphine 4 MG/ML injection 4 mg (4 mg Intravenous Given 03/13/17 0125)  promethazine (PHENERGAN) injection 25 mg (25 mg Intravenous Given 03/13/17 0152)  sodium chloride 0.9 % bolus 2,000 mL (0 mLs Intravenous Stopped 03/13/17 0439)  iopamidol (ISOVUE-300) 61 % injection 100 mL (100 mLs  Intravenous Contrast Given 03/13/17 0401)     Initial Impression / Assessment and Plan / ED Course  I have reviewed the triage vital signs and the nursing notes.  Pertinent labs & imaging results that were available during my care of the patient were reviewed by me and considered in my medical decision making (see chart for details).  Final Clinical Impressions(s) / ED Diagnoses  {I have reviewed and evaluated the relevant laboratory values. {I have reviewed and evaluated the relevant imaging studies.  {I have reviewed the relevant previous healthcare records.  {I obtained HPI from historian.   ED Course:  Assessment: Pt is a 36 y.o. female with hx tubal ligation who presents with RLQ/Suprapubic pain at 5pm. Noted N/V. No fevers. No vaginal bleeding/discharge. LMP April 2nd. Onset of menses soon.. On exam, pt in NAD. Nontoxic/nonseptic appearing. VS with tachycardia. Afebrile. Lungs CTA. Heart RRR. Abdomen TTP RLQ/Suprapubic region. No vaginal bleeding. No discharge. Concern for Ovarian Torision. [place] ordered. iStat lactic 4.41. CBC with WBC 25.2. CMP/Lipase unremarkable. No clear source of infection. UA negative. Normotensive. Afebrile. Given Weight based fluids of 3L. Differential expanded to possible appendicitis. Pending torsion rule out. Lactic and WBC could be related to profuse emesis  PTA. None in ED. Withholding ABX for now. Seen by supervising physician who agrees with plan.    3:32 AM- Ultrasound negative for Torsion. Will proceed with CT Abdomen/Pelvis.  4:42 AM- CT abdomen/Pelvis shows collapsed right corpus luteal cyst. Small free fluid. No appendicitis.   4:59 AM Repeat Lactic 1.7  Plan is to [place] with follow up to PCP. At time of discharge, Patient is in no acute distress. Vital Signs are stable. Patient is able to ambulate. Patient able to tolerate PO.   Disposition/Plan:  DC Home Additional Verbal discharge instructions given and discussed with patient.  Pt Instructed to f/u with PCP in the next week for evaluation and treatment of symptoms. Return precautions given Pt acknowledges and agrees with plan  Supervising Physician Ezequiel Essex, MD  Final diagnoses:  Rupture of ovarian cyst    New Prescriptions New Prescriptions   No medications on file   I personally performed the services described in this documentation, which was scribed in my presence. The recorded information has been reviewed and is accurate.     Shary Decamp, PA-C 03/13/17 [place], MD 03/13/17 [phone]

## 2017-03-13 NOTE — ED Notes (Signed)
Writer notified PA of abnormal I-stat lactic result 4.41

## 2017-03-13 NOTE — ED Provider Notes (Signed)
This is a 36 year old female with history of HTN and obesity who presents to the ED for evaluation of abdominal pain. The patient states that in the last 2 weeks she has experienced intermittent pain over the bilateral lower abdomen without any associated symptoms. In the last 8 hours she has developed frequent vomiting. No fever or diarrhea. She does believes its possible she is pregnant. She states that currently her abdominal pain is mild. No chest pain or dyspnea. No fever or cough. On exam, appears comfortable, moist mucous membranes, suprapubic and epigastric tenderness without guarding or rebound. Lungs clear.  UA, labs, evaluate appendix.   I personally performed the services described in this documentation, which was scribed in my presence. The recorded information has been reviewed and is accurate.    Ezequiel Essex, MD 03/13/17 [phone]

## 2017-03-13 NOTE — ED Triage Notes (Signed)
Pt comes from home, c/o nv starting tonight. Complains of pain 6 out 10 epigastric region moving to lower abd.  V/s 132/84, [id number],

## 2017-05-10 NOTE — Congregational Nurse Program (Signed)
Congregational Nurse Program Note  Date of Encounter: 05/03/2017  Past Medical History: Past Medical History:  Diagnosis Date  . ADHD (attention deficit hyperactivity disorder)   . Anxiety    no meds  . Chronic back pain   . Depression    no meds  . Essential hypertension    a. Dx 06/2016.  . Migraines    last one over 1 month ago  . Overweight   . Palpitations - Sinus Tachycardia    a. 12/2016 Echo: E f55-60%, no rwma, nl LA/RA sizes.  . Placenta previa    w/ 2 pregnancies.  . Preterm labor    c/s at 36 wks  . Scoliosis   . Sinus tachycardia     Encounter Details:     CNP Questionnaire - 05/10/17 1854      Patient Demographics   Is this a new or existing patient? New   Patient is considered a/an Not Applicable   Race African-American/Black     Patient Assistance   Location of Patient Assistance Not Applicable   Patient's financial/insurance status Self-Pay (Uninsured)   Uninsured Patient ([facility]) No   Patient referred to apply for the following financial assistance Not Applicable   Food insecurities addressed Not Applicable   Transportation assistance No   Assistance securing medications No   Educational health offerings Hypertension     Encounter Details   Primary purpose of visit Education/Health Concerns;Chronic Illness/Condition Visit   Was an Emergency Department visit averted? Not Applicable   Does patient have a medical provider? Yes   Patient referred to Clinic   Was a mental health screening completed? (GAINS tool) No   Does patient have dental issues? No   Does patient have vision issues? No   Does your patient have an abnormal blood pressure today? No   Since previous encounter, have you referred patient for abnormal blood pressure that resulted in a new diagnosis or medication change? No   Does your patient have an abnormal blood glucose today? No   Since previous encounter, have you referred patient for abnormal blood glucose  that resulted in a new diagnosis or medication change? No   Was there a life-saving intervention made? No     Initial visit today as nurse was preparing to leave but client requesting her blood  Pressure be checked . B?P  Ok . Counseled ,client is a smoker ,has 3 children ages 46, 72, 29. States she has a medical history of  Migraine headaches , a-fib, high blood pressure ,takes pain medications . Unable to complete history ask client to return next week to talk  More with the nurse ,client agreed.  Incomplete history.

## 2017-05-15 IMAGING — US US PELVIS COMPLETE
1 series · 13 of 25 positions shown · non-contrast
Comparison: CT abdomen and pelvis November 29, 2014

CLINICAL DATA: Pelvic pain. RIGHT lower quadrant pain beginning
last week with nausea, vomiting. G3 P3.

EXAM:
TRANSABDOMINAL AND TRANSVAGINAL ULTRASOUND OF PELVIS
DOPPLER ULTRASOUND OF OVARIES
TECHNIQUE: Both transabdominal and transvaginal ultrasound examinations of the
pelvis were performed. Transabdominal technique was performed for
global imaging of the pelvis including uterus, ovaries, adnexal
regions, and pelvic cul-de-sac.
It was necessary to proceed with endovaginal exam following the
transabdominal exam to visualize the endometrium and adnexum. Color
and duplex Doppler ultrasound was utilized to evaluate blood flow to
the ovaries.

[Series 1: us pelvis complete · 0.20mm/px · 13 of 49 slices shown]
[im 1/49]
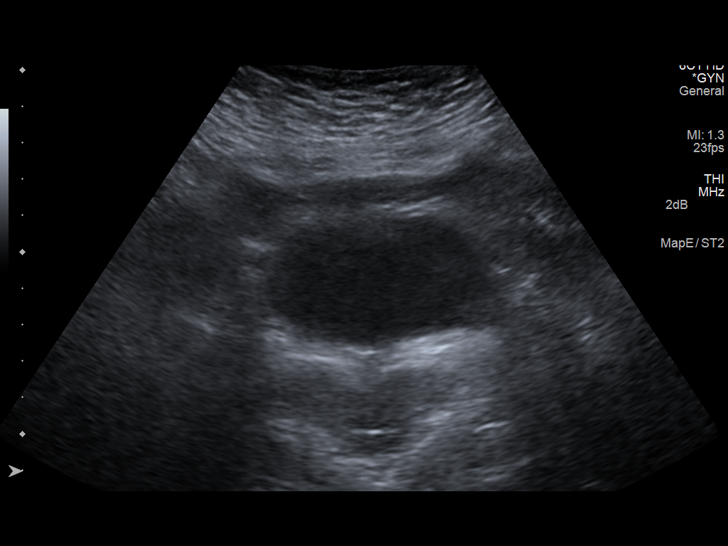
[im 5/49]
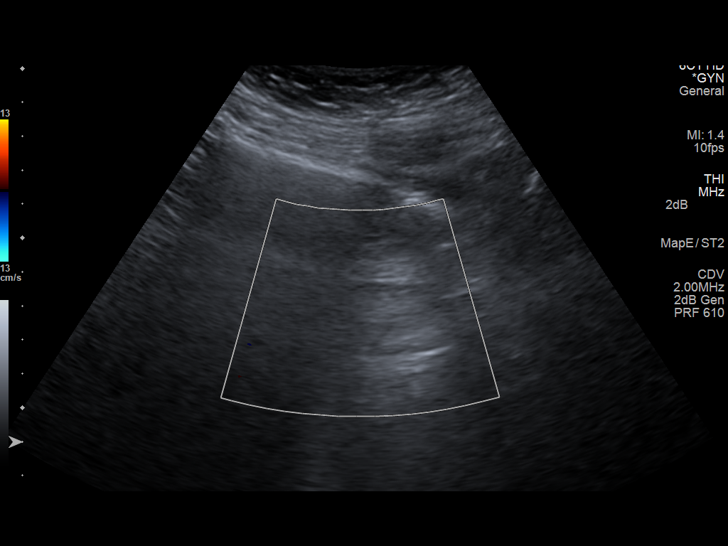
[im 9/49]
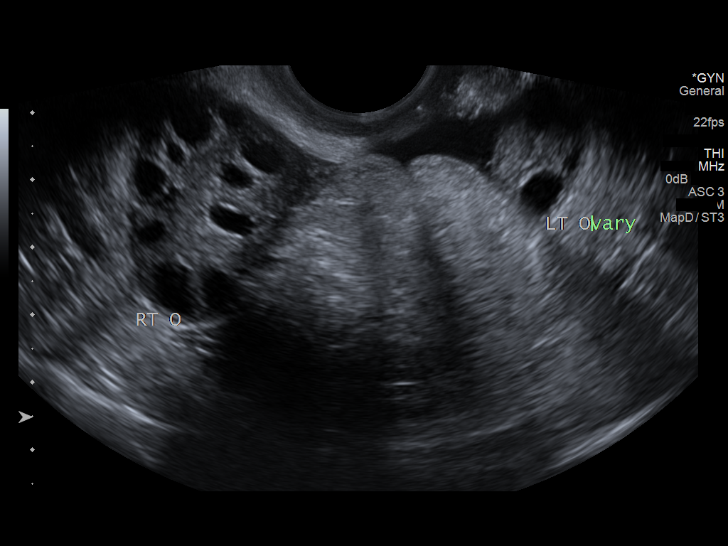
[im 13/49]
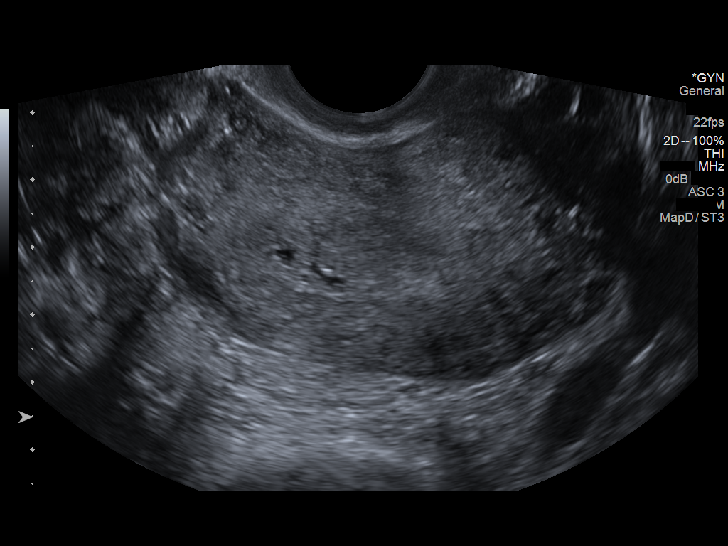
[im 17/49]
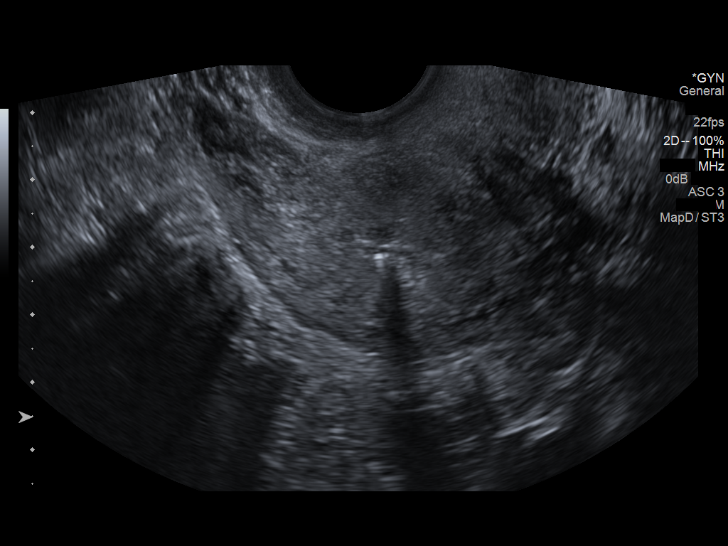
[im 21/49]
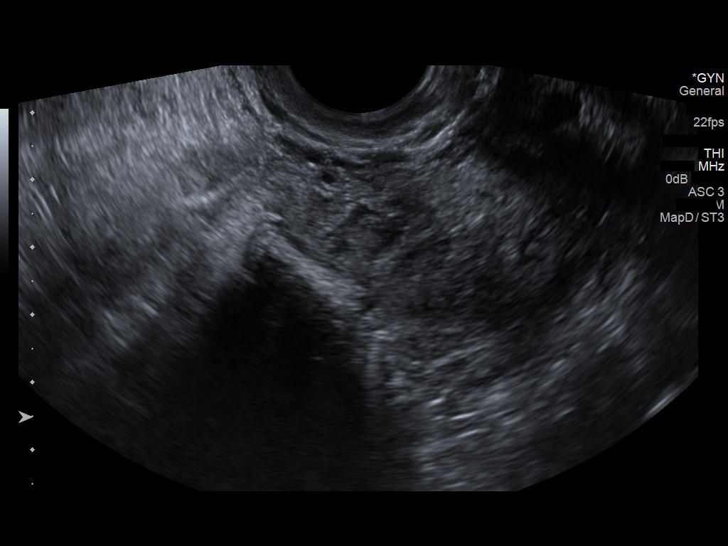
[im 25/49]
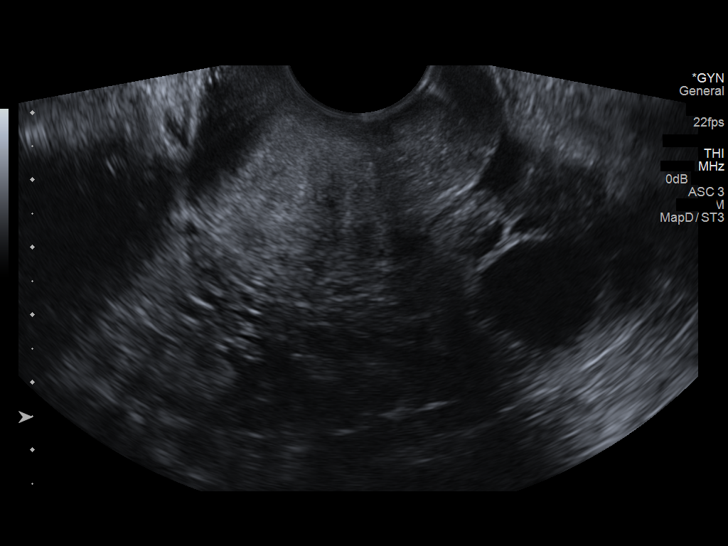
[im 29/49]
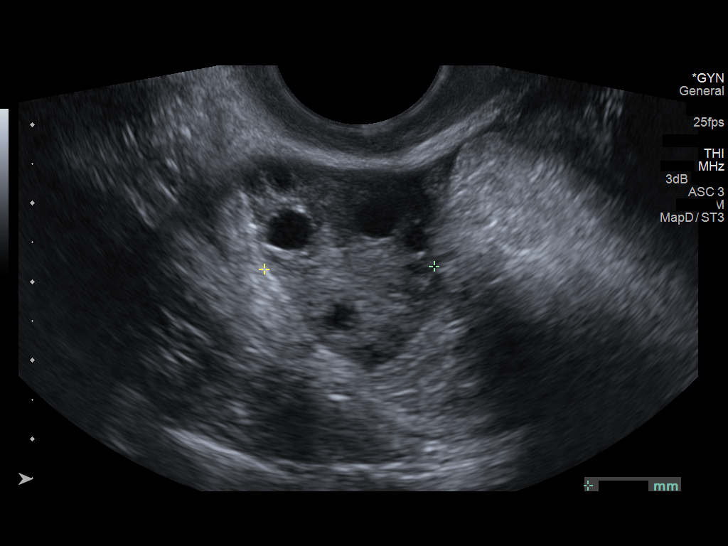
[im 33/49]
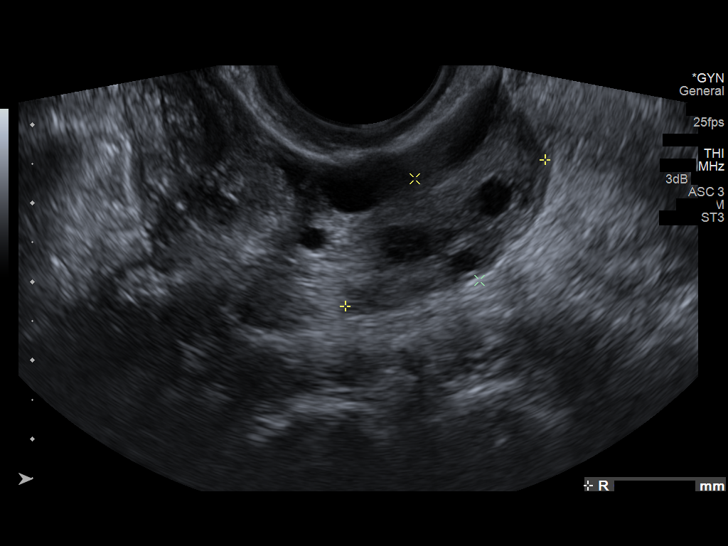
[im 37/49]
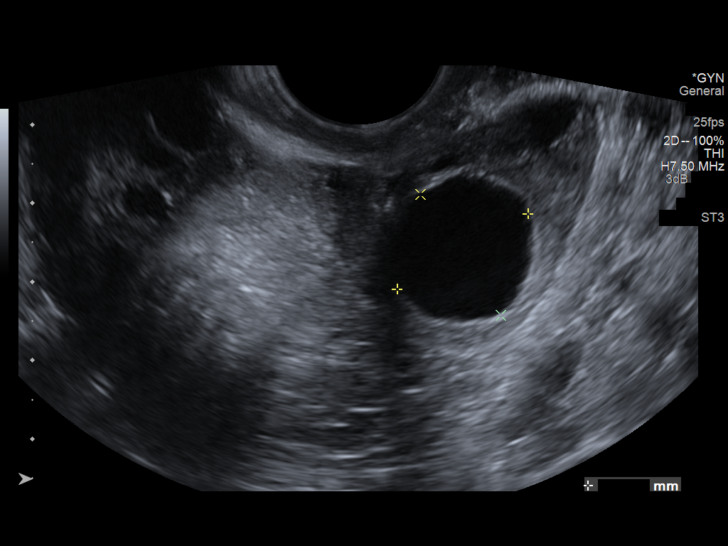
[im 41/49]
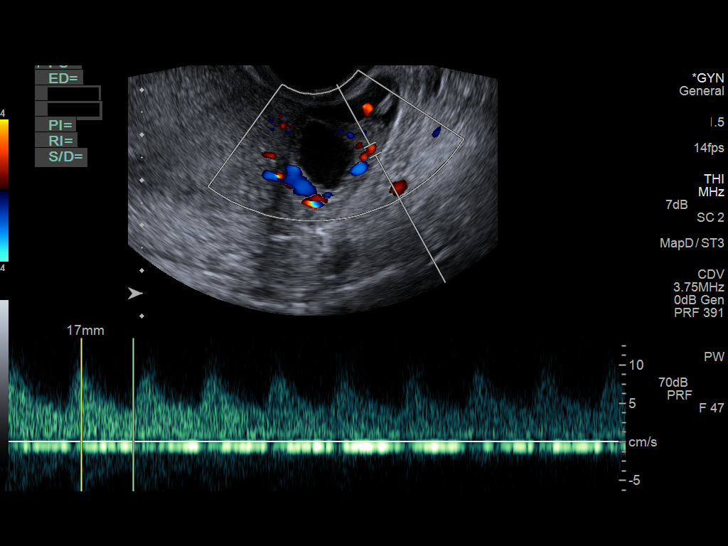
[im 45/49]
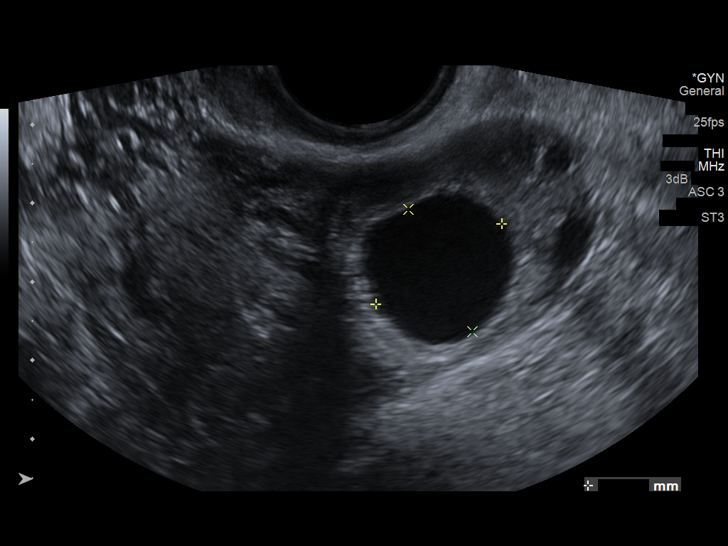
[im 49/49]
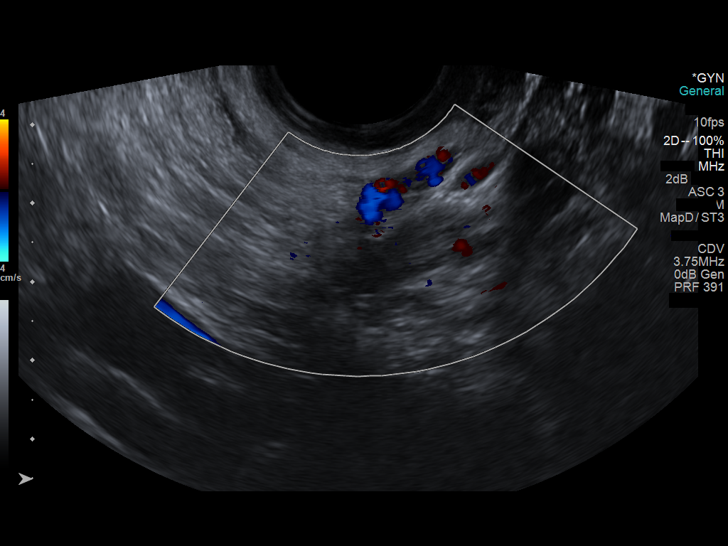

[13 of 25 positions shown; findings below may reference images not displayed]

FINDINGS: Uterus

Measurements: 6.4 x 6.7 x 9.2 cm. No fibroids or other mass
visualized.

Endometrium

Thickness: 9 mm. No focal abnormality visualized. Linear echogenic
intrauterine device in place.

Right ovary

Measurements: 3.2 x 1.5 x 2.2 cm. Normal appearance/no adnexal mass.

Left ovary

Measurements: 3.8 x 2.8 x 3.4 cm. Normal appearance/no adnexal mass.
Dominant 19 mm anechoic follicle.

Pulsed Doppler evaluation of both ovaries demonstrates normal
low-resistance arterial and venous waveforms.

Other findings

No free fluid.
IMPRESSION: Normal pelvic ultrasound, IUD in place.

## 2017-05-23 NOTE — Congregational Nurse Program (Signed)
Congregational Nurse Program Note  Date of Encounter: 05/16/2017  Past Medical History: Past Medical History:  Diagnosis Date  . ADHD (attention deficit hyperactivity disorder)   . Anxiety    no meds  . Chronic back pain   . Depression    no meds  . Essential hypertension    a. Dx 06/2016.  . Migraines    last one over 1 month ago  . Overweight   . Palpitations - Sinus Tachycardia    a. 12/2016 Echo: E f55-60%, no rwma, nl LA/RA sizes.  . Placenta previa    w/ 2 pregnancies.  . Preterm labor    c/s at 36 wks  . Scoliosis   . Sinus tachycardia     Encounter Details:     CNP Questionnaire - 05/23/17 1749      Patient Demographics   Patient is considered a/an Not Applicable   Race African-American/Black     Patient Assistance   Location of Patient Assistance Not Applicable   Patient's financial/insurance status Medicaid   Uninsured Patient ([facility]) No   Patient referred to apply for the following financial assistance Not Applicable   Food insecurities addressed Not Applicable   Transportation assistance No   Assistance securing medications No   Educational health offerings Hypertension;Chronic disease;Medications     Encounter Details   Primary purpose of visit Spiritual Care/Support Visit;Education/Health Concerns;Chronic Illness/Condition Visit   Was an Emergency Department visit averted? Not Applicable   Does patient have a medical provider? Yes   Patient referred to Follow up with established PCP   Was a mental health screening completed? (GAINS tool) No   Does patient have dental issues? No   Does patient have vision issues? No   Does your patient have an abnormal blood pressure today? No   Since previous encounter, have you referred patient for abnormal blood pressure that resulted in a new diagnosis or medication change? No   Does your patient have an abnormal blood glucose today? No   Since previous encounter, have you referred patient  for abnormal blood glucose that resulted in a new diagnosis or medication change? No   Was there a life-saving intervention made? No    Client in with her 3 children today ,states she is fine feels she has lost some weight ,next appointment is Sept ,2018 to see her PCP, history of heart racing she states, having less migraines since she has been on her medication,just got refills on her medications. States she has knee pain sometimes ,knee swells ,seen in pain management clinic ,waiitng on referral to chiropractor.  No signs  Of SOB or other signs with  Pulse rate up , counseled what to do if she felt heart racing faster ,she states she is aware of signs .

## 2017-05-23 NOTE — Congregational Nurse Program (Signed)
Congregational Nurse Program Note  Date of Encounter: 05/23/2017  Past Medical History: Past Medical History:  Diagnosis Date  . ADHD (attention deficit hyperactivity disorder)   . Anxiety    no meds  . Chronic back pain   . Depression    no meds  . Essential hypertension    a. Dx 06/2016.  . Migraines    last one over 1 month ago  . Overweight   . Palpitations - Sinus Tachycardia    a. 12/2016 Echo: E f55-60%, no rwma, nl LA/RA sizes.  . Placenta previa    w/ 2 pregnancies.  . Preterm labor    c/s at 36 wks  . Scoliosis   . Sinus tachycardia     Encounter Details:     CNP Questionnaire - 05/23/17 1806      Patient Demographics   Is this a new or existing patient? Existing   Patient is considered a/an Not Applicable   Race African-American/Black     Patient Assistance   Location of Patient Assistance Not Applicable   Patient's financial/insurance status Medicaid   Uninsured Patient ([facility]) No   Patient referred to apply for the following financial assistance Not Applicable   Food insecurities addressed Not Applicable   Transportation assistance No   Assistance securing medications No   Educational health offerings Cardiac disease;Medications     Encounter Details   Primary purpose of visit Education/Health Concerns;Spiritual Care/Support Visit;Chronic Illness/Condition Visit   Was an Emergency Department visit averted? Not Applicable   Does patient have a medical provider? Yes   Patient referred to Follow up with established PCP   Was a mental health screening completed? (GAINS tool) No   Does patient have dental issues? No   Does patient have vision issues? No   Does your patient have an abnormal blood pressure today? Yes   Since previous encounter, have you referred patient for abnormal blood pressure that resulted in a new diagnosis or medication change? No   Does your patient have an abnormal blood glucose today? No   Since previous  encounter, have you referred patient for abnormal blood glucose that resulted in a new diagnosis or medication change? No   Was there a life-saving intervention made? No    Client in today just in  From today's journey ,states she is tired today . Blood  Pressure fine ,pulse rate high ,counseled ,no signs of symptoms ,no SOB. Taking her medications ,no complaints . Will monitor pulse rates weekly . If any signs of increasing pulse rate ,familiar with sihns and symptoms will call if need EMS.

## 2017-05-24 NOTE — Congregational Nurse Program (Signed)
Congregational Nurse Program Note  Date of Encounter: 05/24/2017  Past Medical History: Past Medical History:  Diagnosis Date  . ADHD (attention deficit hyperactivity disorder)   . Anxiety    no meds  . Chronic back pain   . Depression    no meds  . Essential hypertension    a. Dx 06/2016.  . Migraines    last one over 1 month ago  . Overweight   . Palpitations - Sinus Tachycardia    a. 12/2016 Echo: E f55-60%, no rwma, nl LA/RA sizes.  . Placenta previa    w/ 2 pregnancies.  . Preterm labor    c/s at 36 wks  . Scoliosis   . Sinus tachycardia     Encounter Details:     CNP Questionnaire - 05/24/17 1754      Patient Demographics   Is this a new or existing patient? Existing   Patient is considered a/an Not Applicable   Race African-American/Black     Patient Assistance   Location of Patient Assistance Not Applicable   Patient's financial/insurance status Medicaid   Uninsured Patient ([facility] Connect[facility]) No   Patient referred to apply for the following financial assistance Not Applicable   Food insecurities addressed Not Applicable   Transportation assistance No   Assistance securing medications No   Educational health offerings Chronic disease;Hypertension     Encounter Details   Primary purpose of visit Education/Health Concerns;Spiritual Care/Support Visit   Does patient have a medical provider? Yes   Patient referred to Follow up with established PCP   Was a mental health screening completed? (GAINS tool) No   Does patient have dental issues? No   Does patient have vision issues? No   Does your patient have an abnormal blood pressure today? No   Since previous encounter, have you referred patient for abnormal blood pressure that resulted in a new diagnosis or medication change? No   Does your patient have an abnormal blood glucose today? No   Since previous encounter, have you referred patient for abnormal blood glucose that resulted in a new diagnosis  or medication change? No   Was there a life-saving intervention made? No     Client in with her 3 children today for her blood  Pressure and pulse check . States they atre doing okay ,mother admits today that yesterday she had smoked about  1/2 pack and today she hasn't smoked as much. Pulse rate lower today 106!!  ,blood pressure 103/72 Counseled again regarding and heart rate ,mother seeks the difference today and was given reading materials on yesterday .  Feels fine ,children ready for dinner ,enjoyed camp today .  Follow weekly

## 2017-05-30 NOTE — Congregational Nurse Program (Signed)
Congregational Nurse Program Note  Date of Encounter: 05/30/2017  Past Medical History: Past Medical History:  Diagnosis Date  . ADHD (attention deficit hyperactivity disorder)   . Anxiety    no meds  . Chronic back pain   . Depression    no meds  . Essential hypertension    a. Dx 06/2016.  . Migraines    last one over 1 month ago  . Overweight   . Palpitations - Sinus Tachycardia    a. 12/2016 Echo: E f55-60%, no rwma, nl LA/RA sizes.  . Placenta previa    w/ 2 pregnancies.  . Preterm labor    c/s at 36 wks  . Scoliosis   . Sinus tachycardia     Encounter Details:     CNP Questionnaire - 05/30/17 1855      Patient Demographics   Is this a new or existing patient? Existing   Race African-American/Black     Patient Assistance   Location of Patient Assistance Not Applicable   Patient's financial/insurance status Medicaid   Uninsured Patient ([facility]) No   Patient referred to apply for the following financial assistance Not Applicable   Food insecurities addressed Not Applicable   Transportation assistance No   Assistance securing medications No   Educational health offerings Hypertension     Encounter Details   Primary purpose of visit Education/Health Concerns;Spiritual Care/Support Visit;Chronic Illness/Condition Visit   Was an Emergency Department visit averted? Not Applicable   Does patient have a medical provider? Yes   Patient referred to Follow up with established PCP   Was a mental health screening completed? (GAINS tool) No   Does patient have dental issues? No   Does patient have vision issues? No   Does your patient have an abnormal blood pressure today? No   Since previous encounter, have you referred patient for abnormal blood pressure that resulted in a new diagnosis or medication change? No   Does your patient have an abnormal blood glucose today? No   Since previous encounter, have you referred patient for abnormal blood glucose  that resulted in a new diagnosis or medication change? No   Was there a life-saving intervention made? No    In for vital sign check in with children stating she is having back pains,encouraged to take something for it ,she states I only have my medications ,  if anything relieves the pain. Client last smoked around 2 pm she states. Mother appears a little distracted today ,children very active and she doesn't deal with them ,spoke to them at intervals ,seems to have a lot going on and just  Needed some down time.

## 2017-05-31 NOTE — Congregational Nurse Program (Signed)
Congregational Nurse Program Note  Date of Encounter: 05/31/2017  Past Medical History: Past Medical History:  Diagnosis Date  . ADHD (attention deficit hyperactivity disorder)   . Anxiety    no meds  . Chronic back pain   . Depression    no meds  . Essential hypertension    a. Dx 06/2016.  . Migraines    last one over 1 month ago  . Overweight   . Palpitations - Sinus Tachycardia    a. 12/2016 Echo: E f55-60%, no rwma, nl LA/RA sizes.  . Placenta previa    w/ 2 pregnancies.  . Preterm labor    c/s at 36 wks  . Scoliosis   . Sinus tachycardia     Encounter Details:     CNP Questionnaire - 05/31/17 1916      Patient Demographics   Is this a new or existing patient? Existing   Patient is considered a/an Not Applicable   Race African-American/Black     Patient Assistance   Location of Patient Assistance Not Applicable   Patient's financial/insurance status Medicaid   Uninsured Patient ([facility]) No   Patient referred to apply for the following financial assistance Not Applicable   Transportation assistance No   Assistance securing medications No   Educational health offerings Medications;Hypertension     Encounter Details   Primary purpose of visit Spiritual Care/Support Visit;Education/Health Concerns;Chronic Illness/Condition Visit   Was an Emergency Department visit averted? Not Applicable   Does patient have a medical provider? Yes   Patient referred to Follow up with established PCP   Was a mental health screening completed? (GAINS tool) No   Does patient have dental issues? No   Does patient have vision issues? No   Does your patient have an abnormal blood pressure today? No   Since previous encounter, have you referred patient for abnormal blood pressure that resulted in a new diagnosis or medication change? No   Does your patient have an abnormal blood glucose today? No   Since previous encounter, have you referred patient for abnormal  blood glucose that resulted in a new diagnosis or medication change? No   Was there a life-saving intervention made? No     Client in with  Family  And starting stating her back was hurting ,couldnt hardly sit in chair  But once  Distracted with children and subjects she liked to talk about  ,she was able to move around in the room in no appearing distress. Vitals good ,encouraged to call her  PCP for referral they are to be making to a chiropractor for he as she has stated,, states she has a  muscle relaxant but her husband has her medications ,he keeps them for her and makes sure she takes them correctly ?? Ask her to have him bring her her medications and take them , then she talked about  Going to her room and her and children cleaning it up. Mother again seems overwhelmed with children allows them to roam the room and she gets distracted . Nurse tries to offer suggestions on management and be as supportive as possible   Vitals good .  Follow as needed

## 2017-06-14 NOTE — Congregational Nurse Program (Signed)
Congregational Nurse Program Note  Date of Encounter: 06/14/2017  Past Medical History: Past Medical History:  Diagnosis Date  . ADHD (attention deficit hyperactivity disorder)   . Anxiety    no meds  . Chronic back pain   . Depression    no meds  . Essential hypertension    a. Dx 06/2016.  . Migraines    last one over 1 month ago  . Overweight   . Palpitations - Sinus Tachycardia    a. 12/2016 Echo: E f55-60%, no rwma, nl LA/RA sizes.  . Placenta previa    w/ 2 pregnancies.  . Preterm labor    c/s at 36 wks  . Scoliosis   . Sinus tachycardia     Encounter Details:     CNP Questionnaire - 06/14/17 1739      Patient Demographics   Is this a new or existing patient? Existing   Race African-American/Black     Patient Assistance   Location of Patient Assistance Not Applicable   Patient's financial/insurance status Medicaid   Uninsured Patient ([facility]) No   Patient referred to apply for the following financial assistance Not Applicable   Food insecurities addressed Not Applicable   Transportation assistance No   Assistance securing medications No   Educational health offerings Medications;Hypertension     Encounter Details   Primary purpose of visit Education/Health Concerns;Spiritual Care/Support Visit   Was an Emergency Department visit averted? Not Applicable   Does patient have a medical provider? Yes   Patient referred to Follow up with established PCP   Was a mental health screening completed? (GAINS tool) No   Does patient have dental issues? No   Does patient have vision issues? No   Does your patient have an abnormal blood pressure today? Yes   Since previous encounter, have you referred patient for abnormal blood pressure that resulted in a new diagnosis or medication change? No   Does your patient have an abnormal blood glucose today? No   Since previous encounter, have you referred patient for abnormal blood glucose that resulted in a  new diagnosis or medication change? No   Was there a life-saving intervention made? No    In for blood pressure check . Doing okay ,started her job and likes it ,getting adjusted to it ,hours  are p.m.  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 Working at  [employer] and now is seriously thinking about going bock to school. Nurse encouraged her to set goals  and  Do something for her self. [place] [facility] children . Allowed client to talk about her job and gave support . Still has some back pain encouraged ,encouraged to call her PCP for appointment and referral  Follow  As needed

## 2017-06-20 NOTE — Congregational Nurse Program (Signed)
Congregational Nurse Program Note  Date of Encounter: 06/20/2017  Past Medical History: Past Medical History:  Diagnosis Date  . ADHD (attention deficit hyperactivity disorder)   . Anxiety    no meds  . Chronic back pain   . Depression    no meds  . Essential hypertension    a. Dx 06/2016.  . Migraines    last one over 1 month ago  . Overweight   . Palpitations - Sinus Tachycardia    a. 12/2016 Echo: E f55-60%, no rwma, nl LA/RA sizes.  . Placenta previa    w/ 2 pregnancies.  . Preterm labor    c/s at 36 wks  . Scoliosis   . Sinus tachycardia     Encounter Details:     CNP Questionnaire - 06/20/17 1749      Patient Demographics   Is this a new or existing patient? Existing   Patient is considered a/an Not Applicable   Race African-American/Black     Patient Assistance   Location of Patient Assistance Not Applicable   Patient's financial/insurance status Medicaid   Uninsured Patient ([facility]) No   Patient referred to apply for the following financial assistance Not Applicable   Food insecurities addressed Not Applicable   Transportation assistance No   Assistance securing medications No   Educational health offerings Hypertension     Encounter Details   Primary purpose of visit Spiritual Care/Support Visit;Education/Health Concerns   Does patient have a medical provider? Yes   Patient referred to Follow up with established PCP   Was a mental health screening completed? (GAINS tool) No   Does patient have dental issues? No   Does patient have vision issues? No   Does your patient have an abnormal blood pressure today? Yes   Since previous encounter, have you referred patient for abnormal blood pressure that resulted in a new diagnosis or medication change? No   Does your patient have an abnormal blood glucose today? No   Since previous encounter, have you referred patient for abnormal blood glucose that resulted in a new diagnosis or medication  change? No   Was there a life-saving intervention made? No     In with children today ,states she is really tired ,only 4 hours of sleep in the last 2 days,states she has really been busy ,counseled to slow  Down , new job ,managing children lots to do . Very active  Children but very pleasant . Mom has a lot going on right now  But is trying hard to manage. Support and monitor

## 2017-06-21 NOTE — Congregational Nurse Program (Signed)
Congregational Nurse Program Note  Date of Encounter: 06/21/2017  Past Medical History: Past Medical History:  Diagnosis Date  . ADHD (attention deficit hyperactivity disorder)   . Anxiety    no meds  . Chronic back pain   . Depression    no meds  . Essential hypertension    a. Dx 06/2016.  . Migraines    last one over 1 month ago  . Overweight   . Palpitations - Sinus Tachycardia    a. 12/2016 Echo: E f55-60%, no rwma, nl LA/RA sizes.  . Placenta previa    w/ 2 pregnancies.  . Preterm labor    c/s at 36 wks  . Scoliosis   . Sinus tachycardia     Encounter Details:     CNP Questionnaire - 06/21/17 1844      Patient Demographics   Is this a new or existing patient? Existing   Patient is considered a/an Not Applicable   Race African-American/Black     Patient Assistance   Location of Patient Assistance Not Applicable   Patient's financial/insurance status Medicaid   Uninsured Patient ([facility]) No   Patient referred to apply for the following financial assistance Not Applicable   Food insecurities addressed Not Applicable   Transportation assistance No   Assistance securing medications No   Educational health offerings Hypertension;Medications     Encounter Details   Primary purpose of visit Education/Health Concerns;Spiritual Care/Support Visit;Chronic Illness/Condition Visit   Was an Emergency Department visit averted? Not Applicable   Does patient have a medical provider? Yes   Patient referred to Follow up with established PCP   Was a mental health screening completed? (GAINS tool) No   Does patient have dental issues? No   Does patient have vision issues? No   Does your patient have an abnormal blood pressure today? No   Since previous encounter, have you referred patient for abnormal blood pressure that resulted in a new diagnosis or medication change? No   Does your patient have an abnormal blood glucose today? No   Since previous  encounter, have you referred patient for abnormal blood glucose that resulted in a new diagnosis or medication change? No   Was there a life-saving intervention made? No    In today states she rested well last night after work ,looks refreshed today ,feels much better ,vitals good, counseled regarding mental exhaustion and taking careof self now that she is working ,taking care of 3 children and trying to make plans for them . Was responsive to counseling and smiled.   Follow weekly

## 2017-06-28 NOTE — Congregational Nurse Program (Signed)
Congregational Nurse Program Note  Date of Encounter: 06/28/2017  Past Medical History: Past Medical History:  Diagnosis Date  . ADHD (attention deficit hyperactivity disorder)   . Anxiety    no meds  . Chronic back pain   . Depression    no meds  . Essential hypertension    a. Dx 06/2016.  . Migraines    last one over 1 month ago  . Overweight   . Palpitations - Sinus Tachycardia    a. 12/2016 Echo: E f55-60%, no rwma, nl LA/RA sizes.  . Placenta previa    w/ 2 pregnancies.  . Preterm labor    c/s at 36 wks  . Scoliosis   . Sinus tachycardia     Encounter Details:     CNP Questionnaire - 06/28/17 1906      Patient Demographics   Is this a new or existing patient? Existing   Patient is considered a/an Not Applicable   Race African-American/Black     Patient Assistance   Location of Patient Assistance Not Applicable   Patient's financial/insurance status Medicaid   Uninsured Patient ([facility]) No   Patient referred to apply for the following financial assistance Not Applicable   Food insecurities addressed Not Applicable   Transportation assistance No   Assistance securing medications No   Educational health offerings Hypertension;Spiritual care     Encounter Details   Primary purpose of visit Education/Health Concerns;Spiritual Care/Support Visit   Was an Emergency Department visit averted? Not Applicable   Does patient have a medical provider? Yes   Patient referred to Follow up with established PCP   Was a mental health screening completed? (GAINS tool) No   Does patient have dental issues? No   Does patient have vision issues? No   Does your patient have an abnormal blood pressure today? No   Since previous encounter, have you referred patient for abnormal blood pressure that resulted in a new diagnosis or medication change? No   Does your patient have an abnormal blood glucose today? No   Since previous encounter, have you referred patient  for abnormal blood glucose that resulted in a new diagnosis or medication change? No   Was there a life-saving intervention made? No     In before work to have blood  Pressure check . Walked 13,000 steps today ,very proud of herself and likes her job!! Commended on taking steps to take her health care seriously . Mother doing well juggling work and children !

## 2017-07-11 ENCOUNTER — Encounter (HOSPITAL_COMMUNITY): Payer: Self-pay | Admitting: Emergency Medicine

## 2017-07-11 ENCOUNTER — Emergency Department (HOSPITAL_COMMUNITY): Payer: Medicaid Other

## 2017-07-11 ENCOUNTER — Ambulatory Visit (HOSPITAL_COMMUNITY)
Admission: EM | Admit: 2017-07-11 | Discharge: 2017-07-11 | Disposition: A | Discharge status: Home / Self Care | Payer: Medicaid Other | Attending: Emergency Medicine | Admitting: Emergency Medicine

## 2017-07-11 DIAGNOSIS — R079 Chest pain, unspecified: Secondary | ICD-10-CM | POA: Insufficient documentation

## 2017-07-11 DIAGNOSIS — J45909 Unspecified asthma, uncomplicated: Secondary | ICD-10-CM | POA: Diagnosis not present

## 2017-07-11 DIAGNOSIS — I249 Acute ischemic heart disease, unspecified: Secondary | ICD-10-CM | POA: Diagnosis not present

## 2017-07-11 DIAGNOSIS — F17210 Nicotine dependence, cigarettes, uncomplicated: Secondary | ICD-10-CM | POA: Insufficient documentation

## 2017-07-11 DIAGNOSIS — Z79899 Other long term (current) drug therapy: Secondary | ICD-10-CM | POA: Insufficient documentation

## 2017-07-11 DIAGNOSIS — I10 Essential (primary) hypertension: Secondary | ICD-10-CM | POA: Diagnosis not present

## 2017-07-11 LAB — CBC
HCT: 40 % (ref 36.0–46.0)
Hemoglobin: 13.4 g/dL (ref 12.0–15.0)
MCH: 28.6 pg (ref 26.0–34.0)
MCHC: 33.5 g/dL (ref 30.0–36.0)
MCV: 85.5 fL (ref 78.0–100.0)
Platelets: 324 10*3/uL (ref 150–400)
RBC: 4.68 MIL/uL (ref 3.87–5.11)
RDW: 15.1 % (ref 11.5–15.5)
WBC: 9.8 10*3/uL (ref 4.0–10.5)

## 2017-07-11 LAB — I-STAT TROPONIN, ED: Troponin i, poc: 0 ng/mL (ref 0.00–0.08)

## 2017-07-11 LAB — BASIC METABOLIC PANEL
Anion gap: 7 (ref 5–15)
BUN: 12 mg/dL (ref 6–20)
CO2: 19 mmol/L — ABNORMAL LOW (ref 22–32)
Calcium: 9.2 mg/dL (ref 8.9–10.3)
Chloride: 112 mmol/L — ABNORMAL HIGH (ref 101–111)
Creatinine, Ser: 0.88 mg/dL (ref 0.44–1.00)
GFR calc Af Amer: >60 mL/min (ref >60)
GFR calc non Af Amer: >60 mL/min (ref >60)
Glucose, Bld: 112 mg/dL — ABNORMAL HIGH (ref 65–99)
Potassium: 3.6 mmol/L (ref 3.5–5.1)
Sodium: 138 mmol/L (ref 135–145)

## 2017-07-11 NOTE — ED Notes (Signed)
Pt up to front desk requesting updates, provided answers to questions and apologized for wait.

## 2017-07-11 NOTE — Congregational Nurse Program (Signed)
Congregational Nurse Program Note  Date of Encounter: 07/11/2017  Past Medical History: Past Medical History:  Diagnosis Date  . ADHD (attention deficit hyperactivity disorder)   . Anxiety    no meds  . Chronic back pain   . Depression    no meds  . Essential hypertension    a. Dx 06/2016.  . Migraines    last one over 1 month ago  . Overweight   . Palpitations - Sinus Tachycardia    a. 12/2016 Echo: E f55-60%, no rwma, nl LA/RA sizes.  . Placenta previa    w/ 2 pregnancies.  . Preterm labor    c/s at 36 wks  . Scoliosis   . Sinus tachycardia     Encounter Details:     CNP Questionnaire - 07/11/17 1844      Patient Demographics   Is this a new or existing patient? Existing   Patient is considered a/an Not Applicable   Race African-American/Black     Patient Assistance   Location of Patient Assistance Not Applicable   Patient's financial/insurance status Medicaid   Uninsured Patient ([facility]/Care Connects) No   Patient referred to apply for the following financial assistance Not Applicable   Food insecurities addressed Not Applicable   Transportation assistance No   Assistance securing medications No   Educational health offerings Hypertension;Cardiac disease     Encounter Details   Primary purpose of visit Education/Health Concerns;Spiritual Care/Support Visit;Chronic Illness/Condition Visit   Was an Emergency Department visit averted? No   Does patient have a medical provider? Yes   Patient referred to Urgent Care   Was a mental health screening completed? (GAINS tool) No   Does patient have dental issues? No   Does patient have vision issues? No   Does your patient have an abnormal blood pressure today? No   Since previous encounter, have you referred patient for abnormal blood pressure that resulted in a new diagnosis or medication change? No   Does your patient have an abnormal blood glucose today? No   Since previous encounter, have you referred  patient for abnormal blood glucose that resulted in a new diagnosis or medication change? No   Was there a life-saving intervention made? No    Client in stating she has had chest pain since 9 pm on yesterday ,it has not decreased but continues to linger ,not any worst but pain  is a 7 out of a scale of 0-10 . No shortness of breath, no vomiting ,some nausea ,no numbness ,no tingling an dno radiating of pain to arms ,states its not indigestion no history of reflux, called her PCP and they referred her to ED earlier today but she didn't go ,after counseling was referred to urgent care since pain is still present and she has a history of  A -fib??? Vital signs good ,color good .Client in agreement to seek care ,states she can get to urgent care .

## 2017-07-11 NOTE — ED Triage Notes (Signed)
PT reports chest pain that started last night. Pain is over center of chest with radiation to left shoulder and through to her back. PT reports intermittent sweating last night. PT reports nausea at this time.

## 2017-07-11 NOTE — ED Triage Notes (Signed)
Pt presents with CP that began last night while at work, she works as a Retail buyer; pt states she thought she got too hot and cooled down and the pain didn't resolve, went home, showered, and slept and pain still didn't subside; pt states the CP is midsternal and radiates to the back and down L arm; pt also c/o nausea at this time; pt sent here from [facility]

## 2017-07-12 ENCOUNTER — Emergency Department (HOSPITAL_COMMUNITY): Payer: Medicaid Other

## 2017-07-12 ENCOUNTER — Emergency Department (HOSPITAL_COMMUNITY)
Admission: EM | Admit: 2017-07-12 | Discharge: 2017-07-12 | Disposition: A | Discharge status: Home / Self Care | Payer: Medicaid Other | Attending: Emergency Medicine | Admitting: Emergency Medicine

## 2017-07-12 ENCOUNTER — Other Ambulatory Visit: Payer: Self-pay

## 2017-07-12 DIAGNOSIS — R079 Chest pain, unspecified: Secondary | ICD-10-CM

## 2017-07-12 LAB — I-STAT TROPONIN, ED: Troponin i, poc: 0 ng/mL (ref 0.00–0.08)

## 2017-07-12 MED ORDER — IOPAMIDOL (ISOVUE-370) INJECTION 76%
INTRAVENOUS | Status: AC
  Administered 2017-07-12: 100 mL
  Filled 2017-07-12: qty 100

## 2017-07-12 MED ORDER — SODIUM CHLORIDE 0.9 % IV BOLUS (SEPSIS)
1000.0000 mL | Freq: Once | INTRAVENOUS | Status: AC
  Administered 2017-07-12: 1000 mL via INTRAVENOUS

## 2017-07-12 MED ORDER — KETOROLAC TROMETHAMINE 30 MG/ML IJ SOLN
15.0000 mg | Freq: Once | INTRAMUSCULAR | Status: AC
  Administered 2017-07-12: 15 mg via INTRAVENOUS
  Filled 2017-07-12: qty 1

## 2017-07-12 NOTE — ED Notes (Signed)
ED Provider at bedside. 

## 2017-07-12 NOTE — ED Provider Notes (Signed)
[facility] DEPT Provider Note   CSN: [id number] Arrival date & time: 07/11/17  2051     History   Chief Complaint Chief Complaint  Patient presents with  . Chest Pain    HPI Penny [name] is a 36 y.o. female. With PMH of HTN, sinus tach, here with chest pain. Patient states this started 2 days ago while at work.  The pain is left sided and radiates straights through to her back and is worse with deep inspiration.  She denies having this in the past. Se is compliant with medications.  She called her primary who advised her to come to the ED for further evaluation. The pain is a cramping sensation. It is continuous. She has not taken anything for relief. There are no further complaints.  10 Systems reviewed and are negative for acute change except as noted in the HPI.   HPI  Past Medical History:  Diagnosis Date  . ADHD (attention deficit hyperactivity disorder)   . Anxiety    no meds  . Chronic back pain   . Depression    no meds  . Essential hypertension    a. Dx 06/2016.  . Migraines    last one over 1 month ago  . Overweight   . Palpitations - Sinus Tachycardia    a. 12/2016 Echo: E f55-60%, no rwma, nl LA/RA sizes.  . Placenta previa    w/ 2 pregnancies.  . Preterm labor    c/s at 36 wks  . Scoliosis   . Sinus tachycardia     Patient Active Problem List   Diagnosis Date Noted  . Uterine fibroid 01/23/2016  . Right groin pain 01/23/2016  . History of C-section 01/23/2016  . Migraines   . Anxiety   . Depression     Past Surgical History:  Procedure Laterality Date  . CESAREAN SECTION  2010   x 1 at 36 wks in [place]  . CESAREAN SECTION  February 13, 2012  . CHROMOPERTUBATION  10/26/2015   Procedure: ATTEMPTED CHROMOPERTUBATION;  Surgeon: Crawford Givens, MD;  Location: [facility];  Service: Gynecology;;  . LAPAROSCOPIC LYSIS OF ADHESIONS  10/26/2015   Procedure: LAPAROSCOPIC LYSIS OF ADHESIONS;  Surgeon: Crawford Givens, MD;  Location: [facility];  Service:  Gynecology;;  . LAPAROSCOPY N/A 10/26/2015   Procedure: LAPAROSCOPY OPERATIVE WITH REMOVAL OF MISPLACED FILSHE CLIP;  Surgeon: Crawford Givens, MD;  Location: [facility];  Service: Gynecology;  Laterality: N/A;  . SVD  2003   x 1 in [place]  . TUBAL LIGATION    . WISDOM TOOTH EXTRACTION      OB History    Gravida Para Term Preterm AB Living   3 3 1 2  0 3   SAB TAB Ectopic Multiple Live Births   0 0 0 0 3       Home Medications    Prior to Admission medications   Medication Sig Start Date End Date Taking? Authorizing Provider  DULoxetine (CYMBALTA) 60 MG capsule Take 60 mg by mouth daily. 06/08/17  Yes [provider]  hydrochlorothiazide (HYDRODIURIL) 12.5 MG tablet Take 12.5 mg by mouth daily. 02/21/17  Yes [provider]  hydrOXYzine (VISTARIL) 25 MG capsule Take 25 mg by mouth 2 (two) times daily. 06/08/17  Yes [provider]  meloxicam (MOBIC) 15 MG tablet Take 15 mg by mouth daily. 06/08/17  Yes [provider]  metoprolol succinate (TOPROL-XL) 25 MG 24 hr tablet Take 25 mg by mouth daily. 03/03/17  Yes [provider]  topiramate (TOPAMAX) 50 MG tablet Take 50 mg by mouth 2 (two) times daily. 06/08/17  Yes [provider]  ibuprofen (ADVIL,MOTRIN) 600 MG tablet Take 1 tablet (600 mg total) by mouth every 6 (six) hours as needed. Patient not taking: Reported on 07/12/2017 03/13/17   Shary Decamp, PA-C  oseltamivir (TAMIFLU) 75 MG capsule Take 1 capsule (75 mg total) by mouth every 12 (twelve) hours. Patient not taking: Reported on 03/13/2017 01/10/17   Robyn Haber, MD  promethazine (PHENERGAN) 25 MG tablet Take 1 tablet (25 mg total) by mouth every 6 (six) hours as needed for nausea or vomiting. Patient not taking: Reported on 07/12/2017 03/13/17   Shary Decamp, PA-C    Family History Family History  Problem Relation Age of Onset  . Cancer Mother   . CAD Mother   . Hypertension Sister   . Sickle cell trait Daughter   . Asthma Son    . Asthma Paternal Grandmother   . Pseudochol deficiency Neg Hx   . Malignant hyperthermia Neg Hx   . Anesthesia problems Neg Hx   . Hypotension Neg Hx     Social History Social History  Substance Use Topics  . Smoking status: Current Every Day Smoker    Packs/day: 0.20    Years: 18.00    Types: Cigarettes, E-cigarettes  . Smokeless tobacco: Never Used  . Alcohol use Yes     Comment: rare     Allergies   Shrimp [shellfish allergy]   Review of Systems Review of Systems   Physical Exam Updated Vital Signs BP 110/77   Pulse 80   Temp 98.6 F (37 C) (Oral)   Resp 20   Ht 5\' 4"  (1.626 m)   Wt 83.9 kg (185 lb)   SpO2 100%   BMI 31.76 kg/m   Physical Exam  Constitutional: She is oriented to person, place, and time. She appears well-developed and well-nourished. No distress.  HENT:  Head: Normocephalic and atraumatic.  Nose: Nose normal.  Mouth/Throat: Oropharynx is clear and moist. No oropharyngeal exudate.  Eyes: Pupils are equal, round, and reactive to light. Conjunctivae and EOM are normal. No scleral icterus.  Neck: Normal range of motion. Neck supple. No JVD present. No tracheal deviation present. No thyromegaly present.  Cardiovascular: Normal rate, regular rhythm and normal heart sounds.  Exam reveals no gallop and no friction rub.   No murmur heard. Pulmonary/Chest: Effort normal and breath sounds normal. No respiratory distress. She has no wheezes. She exhibits no tenderness.  Abdominal: Soft. Bowel sounds are normal. She exhibits no distension and no mass. There is no tenderness. There is no rebound and no guarding.  Musculoskeletal: Normal range of motion. She exhibits no edema or tenderness.  Lymphadenopathy:    She has no cervical adenopathy.  Neurological: She is alert and oriented to person, place, and time. No cranial nerve deficit. She exhibits normal muscle tone.  Skin: Skin is warm and dry. No rash noted. No erythema. No pallor.  Nursing note and  vitals reviewed.    ED Treatments / Results  Labs (all labs ordered are listed, but only abnormal results are displayed) Labs Reviewed  BASIC METABOLIC PANEL - Abnormal; Notable for the following:       Result Value   Chloride 112 (*)    CO2 19 (*)    Glucose, Bld 112 (*)    All other components within normal limits  CBC  I-STAT TROPONIN, ED  I-STAT TROPONIN,  ED    EKG  EKG Interpretation  Date/Time:  Tuesday July 11 2017 21:04:37 EDT Ventricular Rate:  72 PR Interval:  150 QRS Duration: 84 QT Interval:  396 QTC Calculation: 433 R Axis:   64 Text Interpretation:  Normal sinus rhythm Early repolarization Normal ECG Mild diffuse St elevations, seen previously on prior EKGs No reciprocal changes Confirmed by Duffy Bruce [phone]) on 07/11/2017 9:15:10 PM       Radiology Dg Chest 2 View  Result Date: 07/11/2017 CLINICAL DATA:  Chest pain, onset at work last night. Pain is midsternal, radiating to the back, and did not subside with rest. EXAM: CHEST  2 VIEW COMPARISON:  12/20/2016 FINDINGS: Marked thoracolumbar scoliosis, unchanged. The lungs are clear. The pulmonary vasculature is normal. There is no pleural effusion. Hilar, mediastinal and cardiac contours are unchanged and unremarkable, allowing for the scoliotic deformity. IMPRESSION: No active cardiopulmonary disease. Electronically Signed   By: Andreas Newport M.D.   On: 07/11/2017 21:44   Ct Angio Chest Aorta W And/or Wo Contrast  Result Date: 07/12/2017 CLINICAL DATA:  Chest pain with normal in EKG, sinus tachycardia, hypertension EXAM: CT ANGIOGRAPHY CHEST WITH CONTRAST TECHNIQUE: Multidetector CT imaging of the chest was performed using the standard protocol during bolus administration of intravenous contrast. Multiplanar CT image reconstructions and MIPs were obtained to evaluate the vascular anatomy. CONTRAST:  80 mL Isovue 370 intravenous COMPARISON:  Radiograph 07/11/2017 FINDINGS: Cardiovascular: Satisfactory  opacification of the pulmonary arteries to the segmental level. No evidence of pulmonary embolism. Normal heart size. No pericardial effusion. Non aneurysmal aorta. No dissection is seen. Mediastinum/Nodes: No enlarged mediastinal, hilar, or axillary lymph nodes. Thyroid gland, trachea, and esophagus demonstrate no significant findings. Lungs/Pleura: Lungs are clear. No pleural effusion or pneumothorax. Upper Abdomen: No acute abnormality. Musculoskeletal: Scoliosis.  No acute or suspicious lesion Review of the MIP images confirms the above findings. IMPRESSION: Negative for acute pulmonary embolus or aortic dissection. Clear lung fields. Electronically Signed   By: Donavan Foil M.D.   On: 07/12/2017 03:45    Procedures Procedures (including critical care time)  Medications Ordered in ED Medications  sodium chloride 0.9 % bolus 1,000 mL (1,000 mLs Intravenous New Bag/Given 07/12/17 0249)  ketorolac (TORADOL) 30 MG/ML injection 15 mg (15 mg Intravenous Given 07/12/17 0249)  iopamidol (ISOVUE-370) 76 % injection (100 mLs  Contrast Given 07/12/17 0319)     Initial Impression / Assessment and Plan / ED Course  I have reviewed the triage vital signs and the nursing notes.  Pertinent labs & imaging results that were available during my care of the patient were reviewed by me and considered in my medical decision making (see chart for details).     Patient presents to the Ed for CP.  Hsitory is concerning for possible dissection. She denies RF for PE.  She denies history of VTE.  Repeat troponin is pending. Repeat Ekg is unchanged. She was given toradol for pain. Will continue to monitor closely.  CT scan is negative for any dissection. Patient's pain is improved with the medication given. Primary care follow-up advised in 3 days. She appears well in acute distress, vital signs were within her normal limits and she is safe for discharge. Repeat troponin and EKG are unremarkable as well.  Final  Clinical Impressions(s) / ED Diagnoses   Final diagnoses:  None    New Prescriptions New Prescriptions   No medications on file     Everlene Balls, MD 07/12/17 [phone]

## 2017-07-12 NOTE — ED Notes (Signed)
Oni MD at bedside

## 2017-07-12 NOTE — Congregational Nurse Program (Signed)
Congregational Nurse Program Note  Date of Encounter: 07/12/2017  Past Medical History: Past Medical History:  Diagnosis Date  . ADHD (attention deficit hyperactivity disorder)   . Anxiety    no meds  . Chronic back pain   . Depression    no meds  . Essential hypertension    a. Dx 06/2016.  . Migraines    last one over 1 month ago  . Overweight   . Palpitations - Sinus Tachycardia    a. 12/2016 Echo: E f55-60%, no rwma, nl LA/RA sizes.  . Placenta previa    w/ 2 pregnancies.  . Preterm labor    c/s at 36 wks  . Scoliosis   . Sinus tachycardia     Encounter Details:     CNP Questionnaire - 07/12/17 1813      Patient Demographics   Is this a new or existing patient? Existing   Patient is considered a/an Not Applicable   Race African-American/Black     Patient Assistance   Location of Patient Assistance Not Applicable   Patient's financial/insurance status Medicaid   Uninsured Patient ([facility] Card/Care Connects) No   Patient referred to apply for the following financial assistance Not Applicable   Food insecurities addressed Not Applicable   Transportation assistance No   Assistance securing medications No   Educational health offerings Hypertension;Medications     Encounter Details   Primary purpose of visit Education/Health Concerns;Spiritual Care/Support Visit;Post ED/Hospitalization Visit   Was an Emergency Department visit averted? No   Does patient have a medical provider? Yes   Patient referred to Follow up with established PCP   Was a mental health screening completed? (GAINS tool) No   Does patient have dental issues? No   Does patient have vision issues? No   Does your patient have an abnormal blood pressure today? No   Since previous encounter, have you referred patient for abnormal blood pressure that resulted in a new diagnosis or medication change? No   Does your patient have an abnormal blood glucose today? No   Since previous encounter, have you  referred patient for abnormal blood glucose that resulted in a new diagnosis or medication change? No   Was there a life-saving intervention made? No     Client was seen in ED on last evening and test were negative she reports but states she still feels bad MD wrote a note  For her to stay out of work for 2 more days but client then broke down and started to cry that she feels bad but has too many bills  And needs to work . Nurse was supportive and counseled MOM about her level of responsibility and maybe she was a little overwhelmed with caring for children and working and knowing bills are piling up . Encouraged her to take care of herself and take some time for self . Not getting lot of rest . Feel she is depressed and has a lot going on . States she took her medications and that helps . Marland Kitchen Needs support system and someone to listen to her . Will continue to be supportive and monitor .

## 2017-07-12 NOTE — ED Notes (Signed)
Patient transported to CT 

## 2017-07-19 NOTE — Congregational Nurse Program (Signed)
Congregational Nurse Program Note  Date of Encounter: 07/19/2017  Past Medical History: Past Medical History:  Diagnosis Date  . ADHD (attention deficit hyperactivity disorder)   . Anxiety    no meds  . Chronic back pain   . Depression    no meds  . Essential hypertension    a. Dx 06/2016.  . Migraines    last one over 1 month ago  . Overweight   . Palpitations - Sinus Tachycardia    a. 12/2016 Echo: E f55-60%, no rwma, nl LA/RA sizes.  . Placenta previa    w/ 2 pregnancies.  . Preterm labor    c/s at 36 wks  . Scoliosis   . Sinus tachycardia     Encounter Details:     CNP Questionnaire - 07/19/17 2318      Patient Demographics   Is this a new or existing patient? Existing   Patient is considered a/an Not Applicable   Race African-American/Black     Patient Assistance   Location of Patient Assistance Not Applicable   Patient's financial/insurance status Medicaid   Uninsured Patient ([facility] Card/Care Connects) No   Patient referred to apply for the following financial assistance Medicaid   Food insecurities addressed Not Applicable   Transportation assistance No   Assistance securing medications No   Educational health offerings Hypertension;Medications     Encounter Details   Primary purpose of visit Education/Health Concerns;Spiritual Care/Support Visit   Was an Emergency Department visit averted? Not Applicable   Does patient have a medical provider? Yes   Patient referred to Follow up with established PCP   Was a mental health screening completed? (GAINS tool) No   Does patient have dental issues? No   Does patient have vision issues? No   Does your patient have an abnormal blood pressure today? No   Since previous encounter, have you referred patient for abnormal blood pressure that resulted in a new diagnosis or medication change? No   Does your patient have an abnormal blood glucose today? No   Since previous encounter, have you referred patient for  abnormal blood glucose that resulted in a new diagnosis or medication change? No   Was there a life-saving intervention made? No    Client in states she is feeling better ,work going okay ,smiling today ,vitals good. Supportive

## 2017-10-27 ENCOUNTER — Emergency Department (HOSPITAL_COMMUNITY)
Admission: EM | Admit: 2017-10-27 | Discharge: 2017-10-28 | Disposition: A | Discharge status: Home / Self Care | Payer: Medicaid Other | Attending: Emergency Medicine | Admitting: Emergency Medicine

## 2017-10-27 ENCOUNTER — Encounter (HOSPITAL_COMMUNITY): Payer: Self-pay | Admitting: Emergency Medicine

## 2017-10-27 ENCOUNTER — Other Ambulatory Visit: Payer: Self-pay

## 2017-10-27 DIAGNOSIS — R45851 Suicidal ideations: Secondary | ICD-10-CM | POA: Insufficient documentation

## 2017-10-27 DIAGNOSIS — F419 Anxiety disorder, unspecified: Secondary | ICD-10-CM | POA: Diagnosis not present

## 2017-10-27 DIAGNOSIS — Z79899 Other long term (current) drug therapy: Secondary | ICD-10-CM | POA: Insufficient documentation

## 2017-10-27 DIAGNOSIS — F329 Major depressive disorder, single episode, unspecified: Secondary | ICD-10-CM | POA: Insufficient documentation

## 2017-10-27 DIAGNOSIS — F909 Attention-deficit hyperactivity disorder, unspecified type: Secondary | ICD-10-CM | POA: Diagnosis not present

## 2017-10-27 DIAGNOSIS — F17210 Nicotine dependence, cigarettes, uncomplicated: Secondary | ICD-10-CM | POA: Diagnosis not present

## 2017-10-27 LAB — COMPREHENSIVE METABOLIC PANEL
ALT: 15 U/L (ref 14–54)
AST: 17 U/L (ref 15–41)
Albumin: 4 g/dL (ref 3.5–5.0)
Alkaline Phosphatase: 66 U/L (ref 38–126)
Anion gap: 7 (ref 5–15)
BUN: 5 mg/dL — ABNORMAL LOW (ref 6–20)
CO2: 23 mmol/L (ref 22–32)
Calcium: 9.2 mg/dL (ref 8.9–10.3)
Chloride: 111 mmol/L (ref 101–111)
Creatinine, Ser: 0.6 mg/dL (ref 0.44–1.00)
GFR calc Af Amer: >60 mL/min (ref >60)
GFR calc non Af Amer: >60 mL/min (ref >60)
Glucose, Bld: 81 mg/dL (ref 65–99)
Potassium: 3.9 mmol/L (ref 3.5–5.1)
Sodium: 141 mmol/L (ref 135–145)
Total Bilirubin: 1 mg/dL (ref 0.3–1.2)
Total Protein: 7.6 g/dL (ref 6.5–8.1)

## 2017-10-27 LAB — CBC
HCT: 41.5 % (ref 36.0–46.0)
Hemoglobin: 14.3 g/dL (ref 12.0–15.0)
MCH: 29.4 pg (ref 26.0–34.0)
MCHC: 34.5 g/dL (ref 30.0–36.0)
MCV: 85.2 fL (ref 78.0–100.0)
Platelets: 338 10*3/uL (ref 150–400)
RBC: 4.87 MIL/uL (ref 3.87–5.11)
RDW: 14.9 % (ref 11.5–15.5)
WBC: 10.1 10*3/uL (ref 4.0–10.5)

## 2017-10-27 LAB — RAPID URINE DRUG SCREEN, HOSP PERFORMED
Amphetamines: NOT DETECTED
Barbiturates: NOT DETECTED
Benzodiazepines: NOT DETECTED
Cocaine: NOT DETECTED
Opiates: NOT DETECTED
Tetrahydrocannabinol: NOT DETECTED

## 2017-10-27 LAB — SALICYLATE LEVEL: Salicylate Lvl: <7 mg/dL (ref 2.8–30.0)

## 2017-10-27 LAB — ACETAMINOPHEN LEVEL: Acetaminophen (Tylenol), Serum: <10 ug/mL — ABNORMAL LOW (ref 10–30)

## 2017-10-27 LAB — ETHANOL: Alcohol, Ethyl (B): <10 mg/dL (ref <10)

## 2017-10-27 MED ORDER — RISPERIDONE 2 MG PO TBDP
2.0000 mg | ORAL_TABLET | Freq: Three times a day (TID) | ORAL | Status: DC | PRN
  Administered 2017-10-28: 2 mg via ORAL
  Filled 2017-10-27 (×2): qty 1

## 2017-10-27 MED ORDER — ZIPRASIDONE MESYLATE 20 MG IM SOLR: 20.0000 mg | INTRAMUSCULAR | Status: DC | PRN

## 2017-10-27 MED ORDER — HYDROXYZINE HCL 25 MG PO TABS
25.0000 mg | ORAL_TABLET | Freq: Two times a day (BID) | ORAL | Status: DC
  Administered 2017-10-27 – 2017-10-28 (×2): 25 mg via ORAL
  Filled 2017-10-27 (×2): qty 1

## 2017-10-27 MED ORDER — ACETAMINOPHEN 325 MG PO TABS
650.0000 mg | ORAL_TABLET | ORAL | Status: DC | PRN
  Administered 2017-10-27: 650 mg via ORAL
  Filled 2017-10-27: qty 2

## 2017-10-27 MED ORDER — HYDROCHLOROTHIAZIDE 25 MG PO TABS
12.5000 mg | ORAL_TABLET | Freq: Every day | ORAL | Status: DC
  Administered 2017-10-27: 12.5 mg via ORAL
  Filled 2017-10-27 (×2): qty 1

## 2017-10-27 MED ORDER — DULOXETINE HCL 60 MG PO CPEP
60.0000 mg | ORAL_CAPSULE | Freq: Every day | ORAL | Status: DC
  Administered 2017-10-27: 60 mg via ORAL
  Filled 2017-10-27 (×2): qty 1

## 2017-10-27 MED ORDER — LORAZEPAM 1 MG PO TABS
1.0000 mg | ORAL_TABLET | ORAL | Status: AC | PRN
  Administered 2017-10-27: 1 mg via ORAL
  Filled 2017-10-27: qty 1

## 2017-10-27 MED ORDER — TOPIRAMATE 25 MG PO TABS
50.0000 mg | ORAL_TABLET | Freq: Two times a day (BID) | ORAL | Status: DC
  Administered 2017-10-27 – 2017-10-28 (×2): 50 mg via ORAL
  Filled 2017-10-27 (×2): qty 2

## 2017-10-27 MED ORDER — METOPROLOL SUCCINATE ER 25 MG PO TB24
25.0000 mg | ORAL_TABLET | Freq: Every day | ORAL | Status: DC
  Administered 2017-10-27: 25 mg via ORAL
  Filled 2017-10-27 (×2): qty 1

## 2017-10-27 MED ORDER — LORATADINE 10 MG PO TABS
10.0000 mg | ORAL_TABLET | Freq: Every day | ORAL | Status: DC
  Administered 2017-10-28: 10 mg via ORAL
  Filled 2017-10-27: qty 1

## 2017-10-27 MED ORDER — ZOLPIDEM TARTRATE 5 MG PO TABS: 5.0000 mg | ORAL_TABLET | Freq: Every evening | ORAL | Status: DC | PRN

## 2017-10-27 NOTE — ED Notes (Signed)
Staffing office made aware of need for sitter.  Patient calm at this time.

## 2017-10-27 NOTE — ED Notes (Signed)
Called for triage no answer  

## 2017-10-27 NOTE — ED Triage Notes (Signed)
Pt states "I need help", her therapist brought her b/c she reveled that she was contemplating suicide last night.  Her husband allowed her to vent to him which helped her through the night.  She requests to be put back on her medications.  She does not want to be forced.  She verbalizes she is very scared and wants to leave.  RN managed to get her to stay until she is seen by a councilors.

## 2017-10-27 NOTE — ED Notes (Addendum)
Pt's counselor called, if further information is needed call Gaspar Cola [phone].

## 2017-10-27 NOTE — ED Provider Notes (Addendum)
[facility] EMERGENCY DEPARTMENT Provider Note   CSN: [id number] Arrival date & time: 10/27/17  1833     History   Chief Complaint Chief Complaint  Patient presents with  . Suicidal    HPI Penny [name] is a 36 y.o. female.  HPI Pt comes in with cc of suicidal ideations. Pt has history of depression.  Patient reports that over the past few days patient's depression has gotten worse and she has started having suicidal ideations.  Patient's plan for suicide would be to jump in front of moving car.  Patient reports that she has not been taking her medication for depression.  Patient denies any substance abuse.  Past Medical History:  Diagnosis Date  . ADHD (attention deficit hyperactivity disorder)   . Anxiety    no meds  . Chronic back pain   . Depression    no meds  . Essential hypertension    a. Dx 06/2016.  . Migraines    last one over 1 month ago  . Overweight   . Palpitations - Sinus Tachycardia    a. 12/2016 Echo: E f55-60%, no rwma, nl LA/RA sizes.  . Placenta previa    w/ 2 pregnancies.  . Preterm labor    c/s at 36 wks  . Scoliosis   . Sinus tachycardia     Patient Active Problem List   Diagnosis Date Noted  . Uterine fibroid 01/23/2016  . Right groin pain 01/23/2016  . History of C-section 01/23/2016  . Migraines   . Anxiety   . Depression     Past Surgical History:  Procedure Laterality Date  . CESAREAN SECTION  2010   x 1 at 36 wks in [place]  . CESAREAN SECTION  February 13, 2012  . CHROMOPERTUBATION  10/26/2015   Procedure: ATTEMPTED CHROMOPERTUBATION;  Surgeon: Crawford Givens, MD;  Location: [facility];  Service: Gynecology;;  . LAPAROSCOPIC LYSIS OF ADHESIONS  10/26/2015   Procedure: LAPAROSCOPIC LYSIS OF ADHESIONS;  Surgeon: Crawford Givens, MD;  Location: [facility];  Service: Gynecology;;  . LAPAROSCOPY N/A 10/26/2015   Procedure: LAPAROSCOPY OPERATIVE WITH REMOVAL OF MISPLACED FILSHE CLIP;  Surgeon: Crawford Givens, MD;   Location: [facility];  Service: Gynecology;  Laterality: N/A;  . SVD  2003   x 1 in [place]  . TUBAL LIGATION    . WISDOM TOOTH EXTRACTION      OB History    Gravida Para Term Preterm AB Living   3 3 1 2  0 3   SAB TAB Ectopic Multiple Live Births   0 0 0 0 3       Home Medications    Prior to Admission medications   Medication Sig Start Date End Date Taking? Authorizing Provider  DULoxetine (CYMBALTA) 60 MG capsule Take 60 mg by mouth daily. 06/08/17  Yes [provider]  hydrochlorothiazide (HYDRODIURIL) 12.5 MG tablet Take 12.5 mg by mouth daily. 02/21/17  Yes [provider]  hydrOXYzine (VISTARIL) 25 MG capsule Take 25 mg by mouth 2 (two) times daily. 06/08/17  Yes [provider]  ibuprofen (ADVIL,MOTRIN) 800 MG tablet Take 1 tablet by mouth 2 (two) times daily as needed for mild pain.  10/06/17  Yes [provider]  metoprolol succinate (TOPROL-XL) 25 MG 24 hr tablet Take 25 mg by mouth daily. 03/03/17  Yes [provider]  RYVENT 6 MG TABS Take 6 mg by mouth daily. 10/06/17  Yes [provider]  topiramate (TOPAMAX) 50 MG tablet  Take 50 mg by mouth 2 (two) times daily. 06/08/17  Yes [provider]    Family History Family History  Problem Relation Age of Onset  . Cancer Mother   . CAD Mother   . Hypertension Sister   . Sickle cell trait Daughter   . Asthma Son   . Asthma Paternal Grandmother   . Pseudochol deficiency Neg Hx   . Malignant hyperthermia Neg Hx   . Anesthesia problems Neg Hx   . Hypotension Neg Hx     Social History Social History   Tobacco Use  . Smoking status: Current Every Day Smoker    Packs/day: 0.20    Years: 18.00    Pack years: 3.60    Types: Cigarettes, E-cigarettes  . Smokeless tobacco: Never Used  Substance Use Topics  . Alcohol use: Yes    Comment: rare  . Drug use: No     Allergies   Shrimp [shellfish allergy]   Review of Systems Review of Systems  Constitutional:  Positive for activity change.  Psychiatric/Behavioral: Positive for decreased concentration.     Physical Exam Updated Vital Signs BP 132/87 (BP Location: Right Arm)   Pulse 95   Temp 98.3 F (36.8 C) (Oral)   Resp 16   SpO2 100%   Physical Exam  Constitutional: She is oriented to person, place, and time. She appears well-developed.  HENT:  Head: Normocephalic and atraumatic.  Eyes: EOM are normal.  Neck: Normal range of motion. Neck supple.  Cardiovascular: Normal rate.  Pulmonary/Chest: Effort normal.  Abdominal: Bowel sounds are normal.  Neurological: She is alert and oriented to person, place, and time.  Skin: Skin is warm and dry.  Psychiatric: Her behavior is normal.  Labile emotionally  Nursing note and vitals reviewed.    ED Treatments / Results  Labs (all labs ordered are listed, but only abnormal results are displayed) Labs Reviewed  COMPREHENSIVE METABOLIC PANEL - Abnormal; Notable for the following components:      Result Value   BUN 5 (*)    All other components within normal limits  ACETAMINOPHEN LEVEL - Abnormal; Notable for the following components:   Acetaminophen (Tylenol), Serum <10 (*)    All other components within normal limits  ETHANOL  SALICYLATE LEVEL  CBC  RAPID URINE DRUG SCREEN, HOSP PERFORMED  I-STAT BETA HCG BLOOD, ED ([facility], [facility], [facility] ONLY)    EKG  EKG Interpretation None       Radiology No results found.  Procedures Procedures (including critical care time)  Medications Ordered in ED Medications  risperiDONE (RISPERDAL M-TABS) disintegrating tablet 2 mg (not administered)    And  LORazepam (ATIVAN) tablet 1 mg (1 mg Oral Given 10/27/17 2323)    And  ziprasidone (GEODON) injection 20 mg (not administered)  acetaminophen (TYLENOL) tablet 650 mg (650 mg Oral Given 10/27/17 2323)  zolpidem (AMBIEN) tablet 5 mg (not administered)  DULoxetine (CYMBALTA) DR capsule 60 mg (not administered)  hydrochlorothiazide (HYDRODIURIL)  tablet 12.5 mg (not administered)  metoprolol succinate (TOPROL-XL) 24 hr tablet 25 mg (not administered)  topiramate (TOPAMAX) tablet 50 mg (not administered)  loratadine (CLARITIN) tablet 10 mg (not administered)  hydrOXYzine (ATARAX/VISTARIL) tablet 25 mg (not administered)     Initial Impression / Assessment and Plan / ED Course  I have reviewed the triage vital signs and the nursing notes.  Pertinent labs & imaging results that were available during my care of the patient were reviewed by me and considered in my medical  decision making (see chart for details).     Patient comes into the emergency room with chief complaint of suicidal ideation.  Patient has history of depression.  She appears to be dealing with her depression on her own, and has stopped taking medications.  Patient is not dwelling into fine details, but it appears that she has had increased stressors in her life for the past few days which has pushed her towards having suicidal thoughts.  Patient does have an active plan to hurt herself, but she is not decompensated to the point where she has poor judgement.  Pt is medically cleared.  Final Clinical Impressions(s) / ED Diagnoses   Final diagnoses:  Suicidal ideation    ED Discharge Orders    None       [name], MD 10/27/17 8184    [name], MD 10/27/17 [phone]

## 2017-10-28 DIAGNOSIS — R450 Nervousness: Secondary | ICD-10-CM

## 2017-10-28 DIAGNOSIS — G4700 Insomnia, unspecified: Secondary | ICD-10-CM

## 2017-10-28 DIAGNOSIS — F332 Major depressive disorder, recurrent severe without psychotic features: Secondary | ICD-10-CM

## 2017-10-28 DIAGNOSIS — F17210 Nicotine dependence, cigarettes, uncomplicated: Secondary | ICD-10-CM

## 2017-10-28 DIAGNOSIS — F411 Generalized anxiety disorder: Secondary | ICD-10-CM

## 2017-10-28 NOTE — ED Notes (Signed)
Regular Diet Ordered for Lunch.

## 2017-10-28 NOTE — Discharge Instructions (Signed)
Please follow up as recommended by the behavioral health team
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
## 2017-10-28 NOTE — Progress Notes (Signed)
CSW informed by Kelby Fam, NP, that pt is cleared for discharge and needs outpt resources.  CSW spoke to [place], Therapist, sports, at [facility] and informed her of plan and faxed list of resources. Winferd Humphrey, MSW, LCSW Clinical Social Worker 10/28/2017 11:55 AM

## 2017-10-28 NOTE — ED Notes (Addendum)
Re-introducted self to pt.  She was tearful sitting on the bed saying that she "feels much better and is ready to go home.  RN explained that she needed to be assessed by psy in the morning.  She became very tearful stating that "I came in voluntarily, I should be able to leave....  I just want to hug my babies."  RN provided some supportive communication. Pt agreed to take some medications to assist her in relaxing.

## 2017-10-28 NOTE — ED Notes (Signed)
Pt given resources - discussed w/pt. Bus pass given as requested.

## 2017-10-28 NOTE — ED Notes (Signed)
Telepsych being performed. 

## 2017-10-28 NOTE — ED Provider Notes (Addendum)
Vitals:   10/27/17 2247 10/28/17 0648  BP: 132/87 103/72  Pulse: 95 73  Resp: 16 16  Temp:  98.5 F (36.9 C)  SpO2: 100% 99%   Medically stable. Awaiting psych disposition.   Dorie Rank, MD 10/28/17 [phone]  Notified by RN Eber Hong that Behavioral health recommends discharge.   Dorie Rank, MD 10/28/17 1210

## 2017-12-20 ENCOUNTER — Ambulatory Visit (HOSPITAL_COMMUNITY)
Admission: EM | Admit: 2017-12-20 | Discharge: 2017-12-20 | Disposition: A | Discharge status: Home / Self Care | Payer: Self-pay | Attending: Family Medicine | Admitting: Family Medicine

## 2017-12-20 ENCOUNTER — Other Ambulatory Visit: Payer: Self-pay

## 2017-12-20 ENCOUNTER — Encounter (HOSPITAL_COMMUNITY): Payer: Self-pay | Admitting: Emergency Medicine

## 2017-12-20 DIAGNOSIS — J40 Bronchitis, not specified as acute or chronic: Secondary | ICD-10-CM

## 2017-12-20 MED ORDER — PROMETHAZINE HCL 25 MG PO TABS: 25.0000 mg | ORAL_TABLET | Freq: Four times a day (QID) | ORAL | 0 refills | Status: DC | PRN

## 2017-12-20 MED ORDER — AZITHROMYCIN 250 MG PO TABS: 250.0000 mg | ORAL_TABLET | Freq: Every day | ORAL | 0 refills | Status: DC

## 2017-12-20 MED ORDER — ONDANSETRON 4 MG PO TBDP: 4.0000 mg | ORAL_TABLET | Freq: Three times a day (TID) | ORAL | 0 refills | Status: DC | PRN

## 2017-12-20 MED ORDER — PREDNISONE 20 MG PO TABS: 40.0000 mg | ORAL_TABLET | Freq: Every day | ORAL | 0 refills | Status: AC

## 2017-12-20 MED ORDER — ALBUTEROL SULFATE HFA 108 (90 BASE) MCG/ACT IN AERS
2.0000 | INHALATION_SPRAY | Freq: Once | RESPIRATORY_TRACT | Status: AC
  Administered 2017-12-20: 2 via RESPIRATORY_TRACT

## 2017-12-20 MED ORDER — ALBUTEROL SULFATE HFA 108 (90 BASE) MCG/ACT IN AERS
INHALATION_SPRAY | RESPIRATORY_TRACT | Status: AC
Start: 2017-12-20 — End: 2017-12-20
  Filled 2017-12-20: qty 6.7

## 2017-12-20 NOTE — ED Provider Notes (Signed)
[place]    CSN: [id number] Arrival date & time: 12/20/17  1800     History   Chief Complaint Chief Complaint  Patient presents with  . Influenza  . Cough  . Emesis  . Headache    HPI Penny [name] is a 37 y.o. female.   37 year old female comes in for 5-day history of URI symptoms.  She has had productive cough, wheezing, shortness of breath, posttussive vomiting.  Denies rhinorrhea, nasal congestion.  Denies fever, chills, night sweats.  States has some dyspnea on exertion as it can exacerbate wheezing.  Has had some epigastric pain with nausea, denies diarrhea.  Denies vomiting without coughing.  States decreased appetite, but drinking fluids without problems.  She is used albuterol inhaler to improve her wheezing, shortness of breath, dyspnea on exertion.  She is a current every day smoker, 20-year smoking history, no smoking 0.2 packs a day.      Past Medical History:  Diagnosis Date  . ADHD (attention deficit hyperactivity disorder)   . Anxiety    no meds  . Chronic back pain   . Depression    no meds  . Essential hypertension    a. Dx 06/2016.  . Migraines    last one over 1 month ago  . Overweight   . Palpitations - Sinus Tachycardia    a. 12/2016 Echo: E f55-60%, no rwma, nl LA/RA sizes.  . Placenta previa    w/ 2 pregnancies.  . Preterm labor    c/s at 36 wks  . Scoliosis   . Sinus tachycardia     Patient Active Problem List   Diagnosis Date Noted  . Uterine fibroid 01/23/2016  . Right groin pain 01/23/2016  . History of C-section 01/23/2016  . Migraines   . Anxiety   . Depression     Past Surgical History:  Procedure Laterality Date  . CESAREAN SECTION  2010   x 1 at 36 wks in [place]  . CESAREAN SECTION  February 13, 2012  . CHROMOPERTUBATION  10/26/2015   Procedure: ATTEMPTED CHROMOPERTUBATION;  Surgeon: Crawford Givens, MD;  Location: [facility];  Service: Gynecology;;  . LAPAROSCOPIC LYSIS OF ADHESIONS  10/26/2015   Procedure:  LAPAROSCOPIC LYSIS OF ADHESIONS;  Surgeon: Crawford Givens, MD;  Location: [facility];  Service: Gynecology;;  . LAPAROSCOPY N/A 10/26/2015   Procedure: LAPAROSCOPY OPERATIVE WITH REMOVAL OF MISPLACED FILSHE CLIP;  Surgeon: Crawford Givens, MD;  Location: [facility];  Service: Gynecology;  Laterality: N/A;  . SVD  2003   x 1 in [place]  . TUBAL LIGATION    . WISDOM TOOTH EXTRACTION      OB History    Gravida Para Term Preterm AB Living   3 3 1 2  0 3   SAB TAB Ectopic Multiple Live Births   0 0 0 0 3       Home Medications    Prior to Admission medications   Medication Sig Start Date End Date Taking? Authorizing Provider  azithromycin (ZITHROMAX) 250 MG tablet Take 1 tablet (250 mg total) by mouth daily. Take first 2 tablets together, then 1 every day until finished. 12/20/17   Tasia Catchings, Tabatha Razzano V, PA-C  DULoxetine (CYMBALTA) 60 MG capsule Take 60 mg by mouth daily. 06/08/17   [provider]  hydrochlorothiazide (HYDRODIURIL) 12.5 MG tablet Take 12.5 mg by mouth daily. 02/21/17   [provider]  hydrOXYzine (VISTARIL) 25 MG capsule Take 25 mg by mouth 2 (two) times daily.  06/08/17   [provider]  ibuprofen (ADVIL,MOTRIN) 800 MG tablet Take 1 tablet by mouth 2 (two) times daily as needed for mild pain.  10/06/17   [provider]  metoprolol succinate (TOPROL-XL) 25 MG 24 hr tablet Take 25 mg by mouth daily. 03/03/17   [provider]  ondansetron (ZOFRAN ODT) 4 MG disintegrating tablet Take 1 tablet (4 mg total) by mouth every 8 (eight) hours as needed for nausea or vomiting. 12/20/17   Tasia Catchings, Zoi Devine V, PA-C  predniSONE (DELTASONE) 20 MG tablet Take 2 tablets (40 mg total) by mouth daily for 5 days. 12/20/17 12/25/17  Tasia Catchings, Jaci Desanto V, PA-C  RYVENT 6 MG TABS Take 6 mg by mouth daily. 10/06/17   [provider]  topiramate (TOPAMAX) 50 MG tablet Take 50 mg by mouth 2 (two) times daily. 06/08/17   [provider]  Cetirizine HCl 10 MG CAPS Take 1 capsule (10 mg total)  by mouth at bedtime. 01/03/15 01/05/15  Forcucci, Courtney, PA-C  fluticasone (FLONASE) 50 MCG/ACT nasal spray Place 2 sprays into both nostrils daily. Patient not taking: Reported on 03/08/2015 01/03/15 03/09/15  Starlyn Skeans, PA-C  gabapentin (NEURONTIN) 300 MG capsule Take 1 capsule (300 mg total) by mouth 3 (three) times daily. Patient not taking: Reported on 11/24/2014 10/07/14 02/10/15  Billy Fischer, MD  ipratropium (ATROVENT) 0.06 % nasal spray Place 2 sprays into both nostrils 4 (four) times daily. For nasal congestion Patient not taking: Reported on 03/08/2015 01/05/15 03/09/15  Lutricia Feil, PA  omeprazole (PRILOSEC) 20 MG capsule Take 1 capsule (20 mg total) by mouth daily. Patient not taking: Reported on 11/28/2014 11/24/14 02/10/15  Jola Schmidt, MD    Family History Family History  Problem Relation Age of Onset  . Cancer Mother   . CAD Mother   . Hypertension Sister   . Sickle cell trait Daughter   . Asthma Son   . Asthma Paternal Grandmother   . Pseudochol deficiency Neg Hx   . Malignant hyperthermia Neg Hx   . Anesthesia problems Neg Hx   . Hypotension Neg Hx     Social History Social History   Tobacco Use  . Smoking status: Current Every Day Smoker    Packs/day: 0.20    Years: 18.00    Pack years: 3.60    Types: Cigarettes, E-cigarettes  . Smokeless tobacco: Never Used  Substance Use Topics  . Alcohol use: Yes    Comment: rare  . Drug use: No     Allergies   Shrimp [shellfish allergy]   Review of Systems Review of Systems  Reason unable to perform ROS: See HPI as above.     Physical Exam Triage Vital Signs ED Triage Vitals  Enc Vitals Group     BP 12/20/17 1901 118/80     Pulse Rate 12/20/17 1901 (!) 106     Resp 12/20/17 1901 17     Temp 12/20/17 1901 99.1 F (37.3 C)     Temp Source 12/20/17 1901 Oral     SpO2 12/20/17 1901 97 %     Weight 12/20/17 1902 176 lb 6 oz (80 kg)     Height 12/20/17 1902 5\' 4"  (1.626 m)     Head  Circumference --      Peak Flow --      Pain Score --      Pain Loc --      Pain Edu? --      Excl. in  GC? --    No data found.  Updated Vital Signs BP 118/80 (BP Location: Left Arm)   Pulse (!) 106   Temp 99.1 F (37.3 C) (Oral)   Resp 17   Ht 5\' 4"  (1.626 m)   Wt 176 lb 6 oz (80 kg)   LMP 12/06/2017   SpO2 97%   BMI 30.27 kg/m   Physical Exam  Constitutional: She is oriented to person, place, and time. She appears well-developed and well-nourished. No distress.  HENT:  Head: Normocephalic and atraumatic.  Right Ear: Tympanic membrane, external ear and ear canal normal. Tympanic membrane is not erythematous and not bulging.  Left Ear: Tympanic membrane, external ear and ear canal normal. Tympanic membrane is not erythematous and not bulging.  Nose: Nose normal. Right sinus exhibits no maxillary sinus tenderness and no frontal sinus tenderness. Left sinus exhibits no maxillary sinus tenderness and no frontal sinus tenderness.  Mouth/Throat: Uvula is midline, oropharynx is clear and moist and mucous membranes are normal.  Eyes: Conjunctivae are normal. Pupils are equal, round, and reactive to light.  Neck: Normal range of motion. Neck supple.  Cardiovascular: Normal rate, regular rhythm and normal heart sounds. Exam reveals no gallop and no friction rub.  No murmur heard. Pulmonary/Chest: Effort normal and breath sounds normal. She has no decreased breath sounds. She has no wheezes. She has no rhonchi. She has no rales.  Abdominal: Soft. Bowel sounds are normal.  Mild tenderness on palpation of epigastric region without guarding or rebound.  No distention noted.  Lymphadenopathy:    She has no cervical adenopathy.  Neurological: She is alert and oriented to person, place, and time.  Skin: Skin is warm and dry.  Psychiatric: She has a normal mood and affect. Her behavior is normal. Judgment normal.   UC Treatments / Results  Labs (all labs ordered are listed, but only  abnormal results are displayed) Labs Reviewed - No data to display  EKG  EKG Interpretation None       Radiology No results found.  Procedures Procedures (including critical care time)  Medications Ordered in UC Medications  albuterol (PROVENTIL HFA;VENTOLIN HFA) 108 (90 Base) MCG/ACT inhaler 2 puff (not administered)     Initial Impression / Assessment and Plan / UC Course  I have reviewed the triage vital signs and the nursing notes.  Pertinent labs & imaging results that were available during my care of the patient were reviewed by me and considered in my medical decision making (see chart for details).    Given history and exam, will treat for COPD exacerbation.  Start azithromycin and prednisone as directed.  Fill albuterol inhaler for shortness of breath and wheezing.  Zofran for nausea/vomiting.  Push fluids.  Return precautions given.  Patient expresses understanding and agrees to plan.  Final Clinical Impressions(s) / UC Diagnoses   Final diagnoses:  Bronchitis    ED Discharge Orders        Ordered    azithromycin (ZITHROMAX) 250 MG tablet  Daily     12/20/17 1943    predniSONE (DELTASONE) 20 MG tablet  Daily     12/20/17 1943    ondansetron (ZOFRAN ODT) 4 MG disintegrating tablet  Every 8 hours PRN     12/20/17 1943        Ok Edwards, PA-C 12/20/17 1949

## 2017-12-20 NOTE — ED Triage Notes (Signed)
Pt. Stated, I've been sick since last Thursday, with headache throwing up, and upper stomach pain and keeps hurting.

## 2017-12-20 NOTE — Discharge Instructions (Signed)
Start azithromycin and prednisone as directed for bronchitis.  Zofran as needed for nausea/vomiting.  Albuterol inhaler as needed for shortness of breath and wheezing. Keep hydrated, your urine should be clear to pale yellow in color. Tylenol/motrin for fever and pain. Monitor for any worsening of symptoms, chest pain, shortness of breath, wheezing, swelling of the throat, go to the emergency department for further evaluation. If experiencing worsening abdominal pain, nausea and vomiting despite medications, fever, go to the emergency department for further evaluation needed.  Otherwise, follow-up with PCP for further management and treatment needed.

## 2018-05-15 ENCOUNTER — Encounter (HOSPITAL_COMMUNITY): Payer: Self-pay

## 2018-05-15 ENCOUNTER — Emergency Department (HOSPITAL_COMMUNITY)
Admission: EM | Admit: 2018-05-15 | Discharge: 2018-05-15 | Disposition: A | Discharge status: Home / Self Care | Payer: Medicaid Other | Attending: Emergency Medicine | Admitting: Emergency Medicine

## 2018-05-15 ENCOUNTER — Other Ambulatory Visit: Payer: Self-pay

## 2018-05-15 DIAGNOSIS — E860 Dehydration: Secondary | ICD-10-CM | POA: Diagnosis not present

## 2018-05-15 DIAGNOSIS — F17210 Nicotine dependence, cigarettes, uncomplicated: Secondary | ICD-10-CM | POA: Diagnosis not present

## 2018-05-15 DIAGNOSIS — Z79899 Other long term (current) drug therapy: Secondary | ICD-10-CM | POA: Diagnosis not present

## 2018-05-15 DIAGNOSIS — I10 Essential (primary) hypertension: Secondary | ICD-10-CM | POA: Insufficient documentation

## 2018-05-15 DIAGNOSIS — F419 Anxiety disorder, unspecified: Secondary | ICD-10-CM | POA: Insufficient documentation

## 2018-05-15 DIAGNOSIS — R002 Palpitations: Secondary | ICD-10-CM | POA: Diagnosis present

## 2018-05-15 LAB — URINALYSIS, ROUTINE W REFLEX MICROSCOPIC
Bilirubin Urine: NEGATIVE
Glucose, UA: NEGATIVE mg/dL
Hgb urine dipstick: NEGATIVE
Ketones, ur: 20 mg/dL — AB
Leukocytes, UA: NEGATIVE
Nitrite: NEGATIVE
Protein, ur: 30 mg/dL — AB
Specific Gravity, Urine: 1.023 (ref 1.005–1.030)
pH: 6 (ref 5.0–8.0)

## 2018-05-15 LAB — BASIC METABOLIC PANEL
Anion gap: 7 (ref 5–15)
BUN: 13 mg/dL (ref 6–20)
CO2: 21 mmol/L — ABNORMAL LOW (ref 22–32)
Calcium: 9.4 mg/dL (ref 8.9–10.3)
Chloride: 113 mmol/L — ABNORMAL HIGH (ref 98–111)
Creatinine, Ser: 0.68 mg/dL (ref 0.44–1.00)
GFR calc Af Amer: >60 mL/min (ref >60)
GFR calc non Af Amer: >60 mL/min (ref >60)
Glucose, Bld: 104 mg/dL — ABNORMAL HIGH (ref 70–99)
Potassium: 3.3 mmol/L — ABNORMAL LOW (ref 3.5–5.1)
Sodium: 141 mmol/L (ref 135–145)

## 2018-05-15 LAB — TROPONIN I: Troponin I: <0.03 ng/mL (ref <0.03)

## 2018-05-15 LAB — I-STAT BETA HCG BLOOD, ED (MC, WL, AP ONLY): I-stat hCG, quantitative: <5 m[IU]/mL (ref <5)

## 2018-05-15 LAB — CBC
HCT: 39.6 % (ref 36.0–46.0)
Hemoglobin: 13.8 g/dL (ref 12.0–15.0)
MCH: 28.8 pg (ref 26.0–34.0)
MCHC: 34.8 g/dL (ref 30.0–36.0)
MCV: 82.7 fL (ref 78.0–100.0)
Platelets: 352 10*3/uL (ref 150–400)
RBC: 4.79 MIL/uL (ref 3.87–5.11)
RDW: 14.5 % (ref 11.5–15.5)
WBC: 9.5 10*3/uL (ref 4.0–10.5)

## 2018-05-15 LAB — TSH: TSH: 0.886 u[IU]/mL (ref 0.350–4.500)

## 2018-05-15 LAB — CBG MONITORING, ED: Glucose-Capillary: 97 mg/dL (ref 70–99)

## 2018-05-15 MED ORDER — SODIUM CHLORIDE 0.9 % IV BOLUS
2000.0000 mL | Freq: Once | INTRAVENOUS | Status: AC
  Administered 2018-05-15: 2000 mL via INTRAVENOUS

## 2018-05-15 MED ORDER — SODIUM CHLORIDE 0.9 % IV SOLN
INTRAVENOUS | Status: DC
  Administered 2018-05-15: 13:00:00 via INTRAVENOUS

## 2018-05-15 MED ORDER — LORAZEPAM 1 MG PO TABS: 1.0000 mg | ORAL_TABLET | Freq: Three times a day (TID) | ORAL | 0 refills | Status: DC | PRN

## 2018-05-15 MED ORDER — LORAZEPAM 2 MG/ML IJ SOLN
0.5000 mg | Freq: Once | INTRAMUSCULAR | Status: AC
  Administered 2018-05-15: 0.5 mg via INTRAVENOUS
  Filled 2018-05-15: qty 1

## 2018-05-15 NOTE — ED Provider Notes (Addendum)
[facility] DEPT Provider Note   CSN: [id number] Arrival date & time: 05/15/18  1155     History   Chief Complaint Chief Complaint  Patient presents with  . Palpitations  . Weakness    HPI Penny [name] is a 37 y.o. female.  37 year old female presents with palpitations but got worse after she took her migraine medication.  States that her heart rate went to 160 and returned spontaneously.  Denies any associated chest pain or shortness of breath.  Has had some increased weakness.  States that she has had insomnia as well as anxiety which is been worsening.  Patient also takes Toprol as well for history of sinus tachycardia and going to the EMR she had a EF of 55 to 60% over a year ago.  Denies any recent history of blood loss.  No leg pain or swelling.  No history of thyroid dysfunction.  Denies any use of illicit drugs which is cocaine.  No use of caffeine.  Called EMS was transported here.     Past Medical History:  Diagnosis Date  . ADHD (attention deficit hyperactivity disorder)   . Anxiety    no meds  . Chronic back pain   . Depression    no meds  . Essential hypertension    a. Dx 06/2016.  . Migraines    last one over 1 month ago  . Overweight   . Palpitations - Sinus Tachycardia    a. 12/2016 Echo: E f55-60%, no rwma, nl LA/RA sizes.  . Placenta previa    w/ 2 pregnancies.  . Preterm labor    c/s at 36 wks  . Scoliosis   . Sinus tachycardia     Patient Active Problem List   Diagnosis Date Noted  . Uterine fibroid 01/23/2016  . Right groin pain 01/23/2016  . History of C-section 01/23/2016  . Migraines   . Anxiety   . Depression     Past Surgical History:  Procedure Laterality Date  . CESAREAN SECTION  2010   x 1 at 36 wks in [place]  . CESAREAN SECTION  February 13, 2012  . CHROMOPERTUBATION  10/26/2015   Procedure: ATTEMPTED CHROMOPERTUBATION;  Surgeon: Crawford Givens, MD;  Location: [facility];  Service: Gynecology;;  .  LAPAROSCOPIC LYSIS OF ADHESIONS  10/26/2015   Procedure: LAPAROSCOPIC LYSIS OF ADHESIONS;  Surgeon: Crawford Givens, MD;  Location: [facility];  Service: Gynecology;;  . LAPAROSCOPY N/A 10/26/2015   Procedure: LAPAROSCOPY OPERATIVE WITH REMOVAL OF MISPLACED FILSHE CLIP;  Surgeon: Crawford Givens, MD;  Location: [facility];  Service: Gynecology;  Laterality: N/A;  . SVD  2003   x 1 in [place]  . TUBAL LIGATION    . WISDOM TOOTH EXTRACTION       OB History    Gravida  3   Para  3   Term  1   Preterm  2   AB  0   Living  3     SAB  0   TAB  0   Ectopic  0   Multiple  0   Live Births  3            Home Medications    Prior to Admission medications   Medication Sig Start Date End Date Taking? Authorizing Provider  azithromycin (ZITHROMAX) 250 MG tablet Take 1 tablet (250 mg total) by mouth daily. Take first 2 tablets together, then 1 every day until finished. 12/20/17   Cathlean Sauer  V, PA-C  DULoxetine (CYMBALTA) 60 MG capsule Take 60 mg by mouth daily. 06/08/17   [provider]  hydrochlorothiazide (HYDRODIURIL) 12.5 MG tablet Take 12.5 mg by mouth daily. 02/21/17   [provider]  hydrOXYzine (VISTARIL) 25 MG capsule Take 25 mg by mouth 2 (two) times daily. 06/08/17   [provider]  ibuprofen (ADVIL,MOTRIN) 800 MG tablet Take 1 tablet by mouth 2 (two) times daily as needed for mild pain.  10/06/17   [provider]  metoprolol succinate (TOPROL-XL) 25 MG 24 hr tablet Take 25 mg by mouth daily. 03/03/17   [provider]  promethazine (PHENERGAN) 25 MG tablet Take 1 tablet (25 mg total) by mouth every 6 (six) hours as needed for nausea or vomiting. 12/20/17   Yu, Amy V, PA-C  RYVENT 6 MG TABS Take 6 mg by mouth daily. 10/06/17   [provider]  topiramate (TOPAMAX) 50 MG tablet Take 50 mg by mouth 2 (two) times daily. 06/08/17   [provider]    Family History Family History  Problem Relation Age of Onset  . Cancer Mother    . CAD Mother   . Hypertension Sister   . Sickle cell trait Daughter   . Asthma Son   . Asthma Paternal Grandmother   . Pseudochol deficiency Neg Hx   . Malignant hyperthermia Neg Hx   . Anesthesia problems Neg Hx   . Hypotension Neg Hx     Social History Social History   Tobacco Use  . Smoking status: Current Every Day Smoker    Packs/day: 0.20    Years: 18.00    Pack years: 3.60    Types: Cigarettes, E-cigarettes  . Smokeless tobacco: Never Used  Substance Use Topics  . Alcohol use: Yes    Comment: rare  . Drug use: No     Allergies   Shrimp [shellfish allergy]   Review of Systems Review of Systems  All other systems reviewed and are negative.    Physical Exam Updated Vital Signs BP 121/87 (BP Location: Left Arm)   Pulse 94   Temp 97.7 F (36.5 C) (Oral)   Resp 14   Ht 1.626 m (5\' 4" )   Wt 77.1 kg (170 lb)   SpO2 99%   BMI 29.18 kg/m   Physical Exam  Constitutional: She is oriented to person, place, and time. She appears well-developed and well-nourished.  Non-toxic appearance. No distress.  HENT:  Head: Normocephalic and atraumatic.  Eyes: Pupils are equal, round, and reactive to light. Conjunctivae, EOM and lids are normal.  Neck: Normal range of motion. Neck supple. No tracheal deviation present. No thyroid mass present.  Cardiovascular: Normal rate, regular rhythm and normal heart sounds. Exam reveals no gallop.  No murmur heard. Pulmonary/Chest: Effort normal and breath sounds normal. No stridor. No respiratory distress. She has no decreased breath sounds. She has no wheezes. She has no rhonchi. She has no rales.  Abdominal: Soft. Normal appearance and bowel sounds are normal. She exhibits no distension. There is no tenderness. There is no rebound and no CVA tenderness.  Musculoskeletal: Normal range of motion. She exhibits no edema or tenderness.  Neurological: She is alert and oriented to person, place, and time. She has normal strength. No  cranial nerve deficit or sensory deficit. GCS eye subscore is 4. GCS verbal subscore is 5. GCS motor subscore is 6.  Skin: Skin is warm and dry. No abrasion and no rash noted.  Psychiatric: Her speech  is normal. Her mood appears anxious. She is withdrawn.  Nursing note and vitals reviewed.    ED Treatments / Results  Labs (all labs ordered are listed, but only abnormal results are displayed) Labs Reviewed  BASIC METABOLIC PANEL - Abnormal; Notable for the following components:      Result Value   Potassium 3.3 (*)    Chloride 113 (*)    CO2 21 (*)    Glucose, Bld 104 (*)    All other components within normal limits  URINALYSIS, ROUTINE W REFLEX MICROSCOPIC - Abnormal; Notable for the following components:   APPearance HAZY (*)    Ketones, ur 20 (*)    Protein, ur 30 (*)    Bacteria, UA RARE (*)    All other components within normal limits  CBC  TSH  CBG MONITORING, ED  I-STAT BETA HCG BLOOD, ED ([facility], [facility], [facility] ONLY)    EKG EKG Interpretation  Date/Time:  Tuesday May 15 2018 12:11:58 EDT Ventricular Rate:  93 PR Interval:    QRS Duration: 88 QT Interval:  379 QTC Calculation: 472 R Axis:   47 Text Interpretation:  Sinus rhythm No significant change since last tracing Confirmed by Lacretia Leigh ([id number]) on 05/15/2018 12:35:40 PM   Radiology No results found.  Procedures Procedures (including critical care time)  Medications Ordered in ED Medications  sodium chloride 0.9 % bolus 2,000 mL (has no administration in time range)  0.9 %  sodium chloride infusion (has no administration in time range)     Initial Impression / Assessment and Plan / ED Course  I have reviewed the triage vital signs and the nursing notes.  Pertinent labs & imaging results that were available during my care of the patient were reviewed by me and considered in my medical decision making (see chart for details).     Patient given IV fluids here and feels better.  Component of anxiety with  her current symptoms.  Was given Ativan and feels much better.  Will prescribe short course of Ativan and she will follow-up with her doctor.  Final Clinical Impressions(s) / ED Diagnoses   Final diagnoses:  None    ED Discharge Orders    None       Lacretia Leigh, MD 05/15/18 1519    Lacretia Leigh, MD 05/15/18 1520

## 2018-05-15 NOTE — ED Triage Notes (Signed)
[place]. Pt reports that she has had vomiting the onset occurred last night, with increasing weakness. Pt reports that she feels as if her heart is racing.     EMS v/s 136/88, HR 80 CBG 106 RR 18

## 2018-07-16 ENCOUNTER — Emergency Department (HOSPITAL_COMMUNITY): Payer: Medicaid Other

## 2018-07-16 ENCOUNTER — Other Ambulatory Visit: Payer: Self-pay

## 2018-07-16 ENCOUNTER — Ambulatory Visit (HOSPITAL_COMMUNITY)
Admission: EM | Admit: 2018-07-16 | Discharge: 2018-07-16 | Disposition: A | Discharge status: Home / Self Care | Payer: Medicaid Other

## 2018-07-16 ENCOUNTER — Encounter (HOSPITAL_COMMUNITY): Payer: Self-pay | Admitting: Emergency Medicine

## 2018-07-16 ENCOUNTER — Emergency Department (HOSPITAL_COMMUNITY)
Admission: EM | Admit: 2018-07-16 | Discharge: 2018-07-16 | Disposition: A | Discharge status: Home / Self Care | Payer: Medicaid Other | Attending: Emergency Medicine | Admitting: Emergency Medicine

## 2018-07-16 DIAGNOSIS — Z79899 Other long term (current) drug therapy: Secondary | ICD-10-CM | POA: Insufficient documentation

## 2018-07-16 DIAGNOSIS — R51 Headache: Secondary | ICD-10-CM | POA: Insufficient documentation

## 2018-07-16 DIAGNOSIS — F17290 Nicotine dependence, other tobacco product, uncomplicated: Secondary | ICD-10-CM | POA: Insufficient documentation

## 2018-07-16 DIAGNOSIS — I10 Essential (primary) hypertension: Secondary | ICD-10-CM | POA: Insufficient documentation

## 2018-07-16 DIAGNOSIS — R519 Headache, unspecified: Secondary | ICD-10-CM

## 2018-07-16 DIAGNOSIS — Z0471 Encounter for examination and observation following alleged adult physical abuse: Secondary | ICD-10-CM | POA: Insufficient documentation

## 2018-07-16 MED ORDER — HYDROCODONE-ACETAMINOPHEN 5-325 MG PO TABS: 1.0000 | ORAL_TABLET | Freq: Four times a day (QID) | ORAL | 0 refills | Status: DC | PRN

## 2018-07-16 MED ORDER — HYDROCODONE-ACETAMINOPHEN 5-325 MG PO TABS
1.0000 | ORAL_TABLET | Freq: Once | ORAL | Status: AC
  Administered 2018-07-16: 1 via ORAL
  Filled 2018-07-16: qty 1
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
## 2018-07-16 NOTE — Discharge Instructions (Addendum)
Continue with 800 mg ibuprofen every 8 hours for pain and inflammation.  You may take Norco as needed for severe pain.  Do not drive or drink alcohol after taking this medication as it may make you drowsy and impair your judgment.  Ice areas of pain and injury 3 to 4 times per day for 15 to 20 minutes each time to assist in lessening inflammation.  We recommend close follow-up with your primary care doctor within the week for repeat evaluation of your symptoms.
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
## 2018-07-16 NOTE — ED Notes (Signed)
Patient Alert and oriented to baseline. Stable and ambulatory to baseline. Patient verbalized understanding of the discharge instructions.  Patient belongings were taken by the patient. Patient verbalized understanding of not driving tonight and stated she has a ride home with a friend.

## 2018-09-15 ENCOUNTER — Other Ambulatory Visit: Payer: Self-pay

## 2018-09-15 ENCOUNTER — Encounter (HOSPITAL_COMMUNITY): Payer: Self-pay

## 2018-09-15 ENCOUNTER — Emergency Department (HOSPITAL_COMMUNITY)
Admission: EM | Admit: 2018-09-15 | Discharge: 2018-09-15 | Disposition: A | Discharge status: Home / Self Care | Payer: Medicaid Other | Attending: Emergency Medicine | Admitting: Emergency Medicine

## 2018-09-15 DIAGNOSIS — Z79899 Other long term (current) drug therapy: Secondary | ICD-10-CM | POA: Diagnosis not present

## 2018-09-15 DIAGNOSIS — I10 Essential (primary) hypertension: Secondary | ICD-10-CM | POA: Insufficient documentation

## 2018-09-15 DIAGNOSIS — F17210 Nicotine dependence, cigarettes, uncomplicated: Secondary | ICD-10-CM | POA: Insufficient documentation

## 2018-09-15 DIAGNOSIS — R531 Weakness: Secondary | ICD-10-CM

## 2018-09-15 LAB — COMPREHENSIVE METABOLIC PANEL
ALT: 9 U/L (ref 0–44)
AST: 21 U/L (ref 15–41)
Albumin: 4.2 g/dL (ref 3.5–5.0)
Alkaline Phosphatase: 82 U/L (ref 38–126)
Anion gap: 9 (ref 5–15)
BUN: 14 mg/dL (ref 6–20)
CO2: 24 mmol/L (ref 22–32)
Calcium: 8.9 mg/dL (ref 8.9–10.3)
Chloride: 110 mmol/L (ref 98–111)
Creatinine, Ser: 0.83 mg/dL (ref 0.44–1.00)
GFR calc Af Amer: >60 mL/min (ref >60)
GFR calc non Af Amer: >60 mL/min (ref >60)
Glucose, Bld: 73 mg/dL (ref 70–99)
Potassium: 3.8 mmol/L (ref 3.5–5.1)
Sodium: 143 mmol/L (ref 135–145)
Total Bilirubin: 0.6 mg/dL (ref 0.3–1.2)
Total Protein: 7.9 g/dL (ref 6.5–8.1)

## 2018-09-15 LAB — URINALYSIS, ROUTINE W REFLEX MICROSCOPIC
Bilirubin Urine: NEGATIVE
Glucose, UA: NEGATIVE mg/dL
Hgb urine dipstick: NEGATIVE
Ketones, ur: NEGATIVE mg/dL
Leukocytes, UA: NEGATIVE
Nitrite: NEGATIVE
Protein, ur: NEGATIVE mg/dL
Specific Gravity, Urine: 1.019 (ref 1.005–1.030)
pH: 6 (ref 5.0–8.0)

## 2018-09-15 LAB — CBC WITH DIFFERENTIAL/PLATELET
Abs Immature Granulocytes: 0.03 10*3/uL (ref 0.00–0.07)
Basophils Absolute: 0.1 10*3/uL (ref 0.0–0.1)
Basophils Relative: 1 %
Eosinophils Absolute: 0.5 10*3/uL (ref 0.0–0.5)
Eosinophils Relative: 5 %
HCT: 41 % (ref 36.0–46.0)
Hemoglobin: 12.9 g/dL (ref 12.0–15.0)
Immature Granulocytes: 0 %
Lymphocytes Relative: 29 %
Lymphs Abs: 2.8 10*3/uL (ref 0.7–4.0)
MCH: 26.8 pg (ref 26.0–34.0)
MCHC: 31.5 g/dL (ref 30.0–36.0)
MCV: 85.2 fL (ref 80.0–100.0)
Monocytes Absolute: 0.6 10*3/uL (ref 0.1–1.0)
Monocytes Relative: 6 %
Neutro Abs: 5.7 10*3/uL (ref 1.7–7.7)
Neutrophils Relative %: 59 %
Platelets: 401 10*3/uL — ABNORMAL HIGH (ref 150–400)
RBC: 4.81 MIL/uL (ref 3.87–5.11)
RDW: 15.9 % — ABNORMAL HIGH (ref 11.5–15.5)
WBC: 9.7 10*3/uL (ref 4.0–10.5)
nRBC: 0 % (ref 0.0–0.2)

## 2018-09-15 LAB — ETHANOL: Alcohol, Ethyl (B): <10 mg/dL (ref <10)

## 2018-09-15 LAB — I-STAT CG4 LACTIC ACID, ED
Lactic Acid, Venous: 1.48 mmol/L (ref 0.5–1.9)
Lactic Acid, Venous: 2.64 mmol/L (ref 0.5–1.9)

## 2018-09-15 LAB — RAPID URINE DRUG SCREEN, HOSP PERFORMED
Amphetamines: NOT DETECTED
Barbiturates: NOT DETECTED
Benzodiazepines: POSITIVE — AB
Cocaine: NOT DETECTED
Opiates: NOT DETECTED
Tetrahydrocannabinol: NOT DETECTED

## 2018-09-15 LAB — PREGNANCY, URINE: Preg Test, Ur: NEGATIVE

## 2018-09-15 MED ORDER — SODIUM CHLORIDE 0.9 % IV BOLUS
1000.0000 mL | Freq: Once | INTRAVENOUS | Status: AC
  Administered 2018-09-15: 1000 mL via INTRAVENOUS

## 2018-09-15 NOTE — ED Notes (Signed)
Provided patient with sandwich and drink to take home medications per EDP instructions.

## 2018-09-15 NOTE — Discharge Instructions (Addendum)
REturn here as needed. Follow up with your doctor.

## 2018-09-15 NOTE — ED Notes (Signed)
Pt requesting food and drink to take home medications. MD notified.

## 2018-09-15 NOTE — ED Notes (Signed)
Bed: FE76 Expected date:  Expected time:  Means of arrival:  Comments: Ems - generalized weakness

## 2018-09-15 NOTE — ED Triage Notes (Signed)
Patient arrived via [facility] from [facility]. Patient is AOx4 and ambulatory. Patient called 911 due to generalized weakness and patient stated "I dont feel right" which began around 1700 today. Pt stated she was off meds for 1 week and has just started back on them.

## 2018-09-17 NOTE — ED Provider Notes (Signed)
[facility] DEPT Provider Note   CSN: [id number] Arrival date & time: 09/15/18  1733     History   Chief Complaint Chief Complaint  Patient presents with  . Generalized Weakness  . "I Dont Feel right"    HPI Penny [name] is a 37 y.o. female.  HPI Patient presents to the emergency department with a feeling of being generally weak.  The patient states that she feels funny all over.  She states she was off of her medicines for almost 5 days.  The patient states she started back yesterday.  The patient states that nothing seems to make her condition better or worse.  Patient states she has no trouble walking she has no trouble with vision and she is alert and oriented.  Patient states is very hard to describe this feeling which she feels like there is something wrong.  The patient denies chest pain, shortness of breath, headache,blurred vision, neck pain, fever, cough, numbness, dizziness, anorexia, edema, abdominal pain, nausea, vomiting, diarrhea, rash, back pain, dysuria, hematemesis, bloody stool, near syncope, or syncope. Past Medical History:  Diagnosis Date  . ADHD (attention deficit hyperactivity disorder)   . Anxiety    no meds  . Chronic back pain   . Depression    no meds  . Essential hypertension    a. Dx 06/2016.  . Migraines    last one over 1 month ago  . Overweight   . Palpitations - Sinus Tachycardia    a. 12/2016 Echo: E f55-60%, no rwma, nl LA/RA sizes.  . Placenta previa    w/ 2 pregnancies.  . Preterm labor    c/s at 36 wks  . Scoliosis   . Sinus tachycardia     Patient Active Problem List   Diagnosis Date Noted  . Uterine fibroid 01/23/2016  . Right groin pain 01/23/2016  . History of C-section 01/23/2016  . Migraines   . Anxiety   . Depression     Past Surgical History:  Procedure Laterality Date  . CESAREAN SECTION  2010   x 1 at 36 wks in [place]  . CESAREAN SECTION  February 13, 2012  . CHROMOPERTUBATION   10/26/2015   Procedure: ATTEMPTED CHROMOPERTUBATION;  Surgeon: Crawford Givens, MD;  Location: [facility];  Service: Gynecology;;  . LAPAROSCOPIC LYSIS OF ADHESIONS  10/26/2015   Procedure: LAPAROSCOPIC LYSIS OF ADHESIONS;  Surgeon: Crawford Givens, MD;  Location: [facility];  Service: Gynecology;;  . LAPAROSCOPY N/A 10/26/2015   Procedure: LAPAROSCOPY OPERATIVE WITH REMOVAL OF MISPLACED FILSHE CLIP;  Surgeon: Crawford Givens, MD;  Location: [facility];  Service: Gynecology;  Laterality: N/A;  . SVD  2003   x 1 in [place]  . TUBAL LIGATION    . WISDOM TOOTH EXTRACTION       OB History    Gravida  3   Para  3   Term  1   Preterm  2   AB  0   Living  3     SAB  0   TAB  0   Ectopic  0   Multiple  0   Live Births  3            Home Medications    Prior to Admission medications   Medication Sig Start Date End Date Taking? Authorizing Provider  AIMOVIG 140 MG/ML SOAJ Inject 1,140 mg into the skin every 30 (thirty) days.  05/14/18  Yes [provider]  atomoxetine (STRATTERA) 60  MG capsule Take 60 mg by mouth daily. 08/21/18  Yes [provider]  cyclobenzaprine (FLEXERIL) 10 MG tablet Take 10 mg by mouth daily as needed for muscle spasms (pain).   Yes [provider]  DULoxetine (CYMBALTA) 30 MG capsule Take 30 mg by mouth daily. Taking with 60 mg daily to equal 90 mg   Yes [provider]  DULoxetine (CYMBALTA) 60 MG capsule Take 60 mg by mouth daily. takin g with 30 mg to equal 90 mg 06/08/17  Yes [provider]  gabapentin (NEURONTIN) 100 MG capsule Take 200 mg by mouth 2 (two) times daily. 08/31/18  Yes [provider]  hydrOXYzine (VISTARIL) 50 MG capsule Take 50 mg by mouth every 8 (eight) hours as needed for anxiety. 08/21/18  Yes [provider]  ibuprofen (ADVIL,MOTRIN) 800 MG tablet Take 1 tablet by mouth 2 (two) times daily as needed for mild pain.  10/06/17  Yes [provider]  LORazepam (ATIVAN) 0.5 MG  tablet Take 0.5 mg by mouth 3 (three) times daily as needed for anxiety. 08/21/18  Yes [provider]  metoprolol succinate (TOPROL-XL) 25 MG 24 hr tablet Take 25 mg by mouth 2 (two) times daily.  03/03/17  Yes [provider]  mirtazapine (REMERON) 30 MG tablet Take 30 mg by mouth at bedtime. 08/21/18  Yes [provider]  RYVENT 6 MG TABS Take 6 mg by mouth daily. 10/06/17  Yes [provider]  Topiramate ER (TROKENDI XR) 200 MG CP24 Take 200 mg by mouth at bedtime.   Yes [provider]  azithromycin (ZITHROMAX) 250 MG tablet Take 1 tablet (250 mg total) by mouth daily. Take first 2 tablets together, then 1 every day until finished. Patient not taking: Reported on 05/15/2018 12/20/17   Ok Edwards, PA-C  HYDROcodone-acetaminophen (NORCO/VICODIN) 5-325 MG tablet Take 1 tablet by mouth every 6 (six) hours as needed for severe pain. Patient not taking: Reported on 09/15/2018 07/16/18   Antonietta Breach, PA-C  LORazepam (ATIVAN) 1 MG tablet Take 1 tablet (1 mg total) by mouth every 8 (eight) hours as needed for anxiety. Patient not taking: Reported on 09/15/2018 05/15/18   Lacretia Leigh, MD  promethazine (PHENERGAN) 25 MG tablet Take 1 tablet (25 mg total) by mouth every 6 (six) hours as needed for nausea or vomiting. Patient not taking: Reported on 05/15/2018 12/20/17   Arturo Morton    Family History Family History  Problem Relation Age of Onset  . Cancer Mother   . CAD Mother   . Hypertension Sister   . Sickle cell trait Daughter   . Asthma Son   . Asthma Paternal Grandmother   . Pseudochol deficiency Neg Hx   . Malignant hyperthermia Neg Hx   . Anesthesia problems Neg Hx   . Hypotension Neg Hx     Social History Social History   Tobacco Use  . Smoking status: Current Every Day Smoker    Packs/day: 0.20    Years: 18.00    Pack years: 3.60    Types: Cigarettes, E-cigarettes  . Smokeless tobacco: Never Used  Substance Use Topics  . Alcohol use: Yes      Comment: rare  . Drug use: Yes    Types: Marijuana     Allergies   Shrimp [shellfish allergy]   Review of Systems Review of Systems All other systems negative except as documented in the HPI. All pertinent positives and negatives as reviewed in the HPI.  Physical Exam Updated Vital Signs BP 119/83 (BP Location: Right Arm)   Pulse 87   Temp 98.1 F (36.7 C) (Oral)   Resp 16   Ht 5\' 2"  (1.575 m)   Wt 78 kg   SpO2 100%   BMI 31.45 kg/m   Physical Exam  Constitutional: She is oriented to person, place, and time. She appears well-developed and well-nourished. No distress.  HENT:  Head: Normocephalic and atraumatic.  Mouth/Throat: Oropharynx is clear and moist.  Eyes: Pupils are equal, round, and reactive to light.  Neck: Normal range of motion. Neck supple.  Cardiovascular: Normal rate, regular rhythm and normal heart sounds. Exam reveals no gallop and no friction rub.  No murmur heard. Pulmonary/Chest: Effort normal and breath sounds normal. No respiratory distress. She has no wheezes.  Abdominal: Soft. Bowel sounds are normal. She exhibits no distension. There is no tenderness.  Musculoskeletal: She exhibits no edema.  Neurological: She is alert and oriented to person, place, and time. She exhibits normal muscle tone. Coordination normal.  Skin: Skin is warm and dry. Capillary refill takes less than 2 seconds. No rash noted. No erythema.  Psychiatric: She has a normal mood and affect. Her behavior is normal. Judgment and thought content normal.  Nursing note and vitals reviewed.    ED Treatments / Results  Labs (all labs ordered are listed, but only abnormal results are displayed) Labs Reviewed  CBC WITH DIFFERENTIAL/PLATELET - Abnormal; Notable for the following components:      Result Value   RDW 15.9 (*)    Platelets 401 (*)    All other components within normal limits  URINALYSIS, ROUTINE W REFLEX MICROSCOPIC - Abnormal; Notable for the following  components:   APPearance CLOUDY (*)    All other components within normal limits  RAPID URINE DRUG SCREEN, HOSP PERFORMED - Abnormal; Notable for the following components:   Benzodiazepines POSITIVE (*)    All other components within normal limits  I-STAT CG4 LACTIC ACID, ED - Abnormal; Notable for the following components:   Lactic Acid, Venous 2.64 (*)    All other components within normal limits  PREGNANCY, URINE  COMPREHENSIVE METABOLIC PANEL  ETHANOL  I-STAT CG4 LACTIC ACID, ED    EKG None  Radiology No results found.  Procedures Procedures (including critical care time)  Medications Ordered in ED Medications  sodium chloride 0.9 % bolus 1,000 mL (0 mLs Intravenous Stopped 09/15/18 1940)     Initial Impression / Assessment and Plan / ED Course  I have reviewed the triage vital signs and the nursing notes.  Pertinent labs & imaging results that were available during my care of the patient were reviewed by me and considered in my medical decision making (see chart for details).     The patient's laboratory testing did not show any significant abnormality.  The patient was given IV fluids and does states she feels somewhat better.  Especially after eating.  The symptoms seem to be somewhat associated with restarting her medications.  I feel that this is most likely because the fact that she takes a lot of antidepressant type medications.  Final Clinical Impressions(s) / ED Diagnoses   Final diagnoses:  General weakness    ED Discharge Orders    None       Dalia Heading, PA-C 09/17/18 0045    Julianne Rice, MD 09/19/18 [phone]

## 2018-09-24 ENCOUNTER — Other Ambulatory Visit: Payer: Self-pay

## 2018-09-24 ENCOUNTER — Ambulatory Visit (HOSPITAL_COMMUNITY)
Admission: EM | Admit: 2018-09-24 | Discharge: 2018-09-24 | Disposition: A | Discharge status: Home / Self Care | Payer: Medicaid Other | Attending: Family Medicine | Admitting: Family Medicine

## 2018-09-24 ENCOUNTER — Encounter (HOSPITAL_COMMUNITY): Payer: Self-pay | Admitting: Emergency Medicine

## 2018-09-24 DIAGNOSIS — R6884 Jaw pain: Secondary | ICD-10-CM

## 2018-09-24 MED ORDER — MELOXICAM 7.5 MG PO TABS: 7.5000 mg | ORAL_TABLET | Freq: Every day | ORAL | 0 refills | Status: DC
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
## 2018-09-24 NOTE — ED Triage Notes (Signed)
The patient presented to the [facility] with a complaint of left side jaw pain and burning x 1 week.

## 2018-09-24 NOTE — Discharge Instructions (Signed)
Ice application at the end of the day.  Start meloxicam daily, take with food, don't take additional ibuprofen.  Soft foods to prevent exacerbation of pain.  Please continue to follow up with your primary care provider as may need referral for any persistent symptoms.

## 2018-10-24 ENCOUNTER — Encounter (HOSPITAL_COMMUNITY): Payer: Self-pay | Admitting: Emergency Medicine

## 2018-10-24 ENCOUNTER — Ambulatory Visit (HOSPITAL_COMMUNITY)
Admission: EM | Admit: 2018-10-24 | Discharge: 2018-10-24 | Disposition: A | Discharge status: Home / Self Care | Payer: Medicaid Other | Attending: Emergency Medicine | Admitting: Emergency Medicine

## 2018-10-24 DIAGNOSIS — L03031 Cellulitis of right toe: Secondary | ICD-10-CM | POA: Insufficient documentation

## 2018-10-24 MED ORDER — CHLORHEXIDINE GLUCONATE 4 % EX LIQD: Freq: Every day | CUTANEOUS | 0 refills | Status: DC | PRN

## 2018-10-24 MED ORDER — DOXYCYCLINE HYCLATE 100 MG PO CAPS: 100.0000 mg | ORAL_CAPSULE | Freq: Two times a day (BID) | ORAL | 0 refills | Status: AC

## 2018-10-24 MED ORDER — IBUPROFEN 600 MG PO TABS: 600.0000 mg | ORAL_TABLET | Freq: Four times a day (QID) | ORAL | 0 refills | Status: DC | PRN

## 2018-10-24 NOTE — ED Triage Notes (Addendum)
Pt presents to ED for assessment of pain, redness and swelling since Thanksgiving to great toe of right foot.  States seen by her PCP last week and started on abx, but states "it's not agreeing with me".

## 2018-10-24 NOTE — Discharge Instructions (Addendum)
chlorhexidine and warm water soaks to help keep it clean, wear a clean sock at all times while wearing a shoe, discontinue Keflex, start doxycycline, ibuprofen 600 mg with 1 g of Tylenol together 3 or 4 times a day as needed for pain.  Try wedging a small piece of cotton underneath the nail after you soak your foot to try and help get the nail to grow out straight.  Follow-up with [facility] if not better in a week.

## 2018-10-24 NOTE — ED Provider Notes (Signed)
HPI  SUBJECTIVE:  Penny [name] is a 37 y.o. female who presents with pain, swelling, erythema along the lateral nail fold of her right great first toe starting several weeks ago after cutting her toenail "too short".  She reports yellowish drainage.  Denies nausea, vomiting, fevers, body aches.  States that the pain is constant, worse with palpation and walking, no alleviating factors.  She has been doing warm soaks.  She saw her primary care provider for this, was started on Keflex, but states that it is "not agreeing with her".  States that she is having nausea, dizziness with it.  Past medical history negative for diabetes.  PMD: Vonna Drafts, FNP     Past Medical History:  Diagnosis Date  . ADHD (attention deficit hyperactivity disorder)   . Anxiety    no meds  . Chronic back pain   . Depression    no meds  . Essential hypertension    a. Dx 06/2016.  . Migraines    last one over 1 month ago  . Overweight   . Palpitations - Sinus Tachycardia    a. 12/2016 Echo: E f55-60%, no rwma, nl LA/RA sizes.  . Placenta previa    w/ 2 pregnancies.  . Preterm labor    c/s at 36 wks  . Scoliosis   . Sinus tachycardia     Past Surgical History:  Procedure Laterality Date  . CESAREAN SECTION  2010   x 1 at 36 wks in [place]  . CESAREAN SECTION  February 13, 2012  . CHROMOPERTUBATION  10/26/2015   Procedure: ATTEMPTED CHROMOPERTUBATION;  Surgeon: Crawford Givens, MD;  Location: [facility];  Service: Gynecology;;  . LAPAROSCOPIC LYSIS OF ADHESIONS  10/26/2015   Procedure: LAPAROSCOPIC LYSIS OF ADHESIONS;  Surgeon: Crawford Givens, MD;  Location: [facility];  Service: Gynecology;;  . LAPAROSCOPY N/A 10/26/2015   Procedure: LAPAROSCOPY OPERATIVE WITH REMOVAL OF MISPLACED FILSHE CLIP;  Surgeon: Crawford Givens, MD;  Location: [facility];  Service: Gynecology;  Laterality: N/A;  . SVD  2003   x 1 in [place]  . TUBAL LIGATION    . WISDOM TOOTH EXTRACTION      Family History  Problem Relation Age of  Onset  . Cancer Mother   . CAD Mother   . Hypertension Sister   . Sickle cell trait Daughter   . Asthma Son   . Asthma Paternal Grandmother   . Pseudochol deficiency Neg Hx   . Malignant hyperthermia Neg Hx   . Anesthesia problems Neg Hx   . Hypotension Neg Hx     Social History   Tobacco Use  . Smoking status: Current Every Day Smoker    Packs/day: 0.20    Years: 18.00    Pack years: 3.60    Types: Cigarettes, E-cigarettes  . Smokeless tobacco: Never Used  Substance Use Topics  . Alcohol use: Yes    Comment: rare  . Drug use: Yes    Types: Marijuana    No current facility-administered medications for this encounter.   Current Outpatient Medications:  .  AIMOVIG 140 MG/ML SOAJ, Inject 1,140 mg into the skin every 30 (thirty) days. , Disp: , Rfl: 3 .  atomoxetine (STRATTERA) 60 MG capsule, Take 60 mg by mouth daily., Disp: , Rfl: 2 .  chlorhexidine (HIBICLENS) 4 % external liquid, Apply topically daily as needed. Dilute 10-15 mL in water, Use daily when bathing for 1-2 weeks, Disp: 120 mL, Rfl: 0 .  cyclobenzaprine (FLEXERIL) 10  MG tablet, Take 10 mg by mouth daily as needed for muscle spasms (pain)., Disp: , Rfl:  .  doxycycline (VIBRAMYCIN) 100 MG capsule, Take 1 capsule (100 mg total) by mouth 2 (two) times daily for 7 days., Disp: 14 capsule, Rfl: 0 .  DULoxetine (CYMBALTA) 30 MG capsule, Take 30 mg by mouth daily. Taking with 60 mg daily to equal 90 mg, Disp: , Rfl:  .  DULoxetine (CYMBALTA) 60 MG capsule, Take 60 mg by mouth daily. takin g with 30 mg to equal 90 mg, Disp: , Rfl: 4 .  gabapentin (NEURONTIN) 100 MG capsule, Take 200 mg by mouth 2 (two) times daily., Disp: , Rfl: 0 .  hydrOXYzine (VISTARIL) 50 MG capsule, Take 50 mg by mouth every 8 (eight) hours as needed for anxiety., Disp: , Rfl: 2 .  ibuprofen (ADVIL,MOTRIN) 600 MG tablet, Take 1 tablet (600 mg total) by mouth every 6 (six) hours as needed., Disp: 30 tablet, Rfl: 0 .  LORazepam (ATIVAN) 0.5 MG tablet,  Take 0.5 mg by mouth 3 (three) times daily as needed for anxiety., Disp: , Rfl: 1 .  metoprolol succinate (TOPROL-XL) 25 MG 24 hr tablet, Take 25 mg by mouth 2 (two) times daily. , Disp: , Rfl: 3 .  mirtazapine (REMERON) 30 MG tablet, Take 30 mg by mouth at bedtime., Disp: , Rfl: 2 .  RYVENT 6 MG TABS, Take 6 mg by mouth daily., Disp: , Rfl: 1 .  Topiramate ER (TROKENDI XR) 200 MG CP24, Take 200 mg by mouth at bedtime., Disp: , Rfl:   Allergies  Allergen Reactions  . Shrimp [Shellfish Allergy] Itching and Swelling    Itching and swelling in mouth and face     ROS  As noted in HPI.   Physical Exam  BP 123/87 (BP Location: Left Arm)   Pulse 81   Temp 98.2 F (36.8 C) (Oral)   Resp 16   SpO2 100%   Constitutional: Well developed, well nourished, no acute distress Eyes:  EOMI, conjunctiva normal bilaterally HENT: Normocephalic, atraumatic,mucus membranes moist Respiratory: Normal inspiratory effort Cardiovascular: Normal rate GI: nondistended skin: No rash, skin intact Musculoskeletal: Tender area of erythema, swelling lateral nail fold of the right first great toe with whitish discoloration.  Irregularly cut nail.  Positive serous drainage.     Neurologic: Alert & oriented x 3, no focal neuro deficits Psychiatric: Speech and behavior appropriate   ED Course   Medications - No data to display  No orders of the defined types were placed in this encounter.   No results found for this or any previous visit (from the past 24 hour(s)). No results found.  ED Clinical Impression  Paronychia of great toe of right foot   ED Assessment/Plan  Patient with a cellulitis/paronychia of the toe, possibly early ingrown toenail.  Offered to do a digital block and try an I&D, patient declined.  She has opted to do chlorhexidine soaks, discontinue Keflex, start doxycycline, ibuprofen 600 mg with 1 g of Tylenol together 3 or 4 times a day as needed for pain.  Advised cotton wedges  under her nail after she soaks her foot to help try to get the nail to grow out straight.  We will have her follow-up with the [facility] if not better in a week.  Meds ordered this encounter  Medications  . doxycycline (VIBRAMYCIN) 100 MG capsule    Sig: Take 1 capsule (100 mg total) by mouth 2 (two) times daily  for 7 days.    Dispense:  14 capsule    Refill:  0  . chlorhexidine (HIBICLENS) 4 % external liquid    Sig: Apply topically daily as needed. Dilute 10-15 mL in water, Use daily when bathing for 1-2 weeks    Dispense:  120 mL    Refill:  0  . ibuprofen (ADVIL,MOTRIN) 600 MG tablet    Sig: Take 1 tablet (600 mg total) by mouth every 6 (six) hours as needed.    Dispense:  30 tablet    Refill:  0    *This clinic note was created using [employer]. Therefore, there may be occasional mistakes despite careful proofreading.   ?   [name], MD 10/25/18 1304

## 2018-12-06 ENCOUNTER — Other Ambulatory Visit: Payer: Self-pay

## 2018-12-06 ENCOUNTER — Encounter (HOSPITAL_COMMUNITY): Payer: Self-pay

## 2018-12-06 ENCOUNTER — Ambulatory Visit (HOSPITAL_COMMUNITY)
Admission: EM | Admit: 2018-12-06 | Discharge: 2018-12-06 | Disposition: A | Discharge status: Home / Self Care | Payer: Medicaid Other | Attending: Internal Medicine | Admitting: Internal Medicine

## 2018-12-06 DIAGNOSIS — M542 Cervicalgia: Secondary | ICD-10-CM | POA: Diagnosis not present

## 2018-12-06 MED ORDER — PROMETHAZINE HCL 12.5 MG PO TABS: 12.5000 mg | ORAL_TABLET | Freq: Once | ORAL | Status: DC

## 2018-12-06 MED ORDER — ONDANSETRON 4 MG PO TBDP
ORAL_TABLET | ORAL | Status: AC
  Filled 2018-12-06: qty 1

## 2018-12-06 MED ORDER — ONDANSETRON 4 MG PO TBDP
4.0000 mg | ORAL_TABLET | Freq: Once | ORAL | Status: AC
  Administered 2018-12-06: 4 mg via ORAL

## 2018-12-06 MED ORDER — PROMETHAZINE HCL 25 MG PO TABS: 25.0000 mg | ORAL_TABLET | Freq: Four times a day (QID) | ORAL | 0 refills | Status: DC | PRN

## 2018-12-06 NOTE — ED Triage Notes (Signed)
Pt cc she has neck pain and headache. Pt had a tooth pulled yesterday she's still having pain from that. Pt has taken 200 mg gabapentin , and 10 mg of Perocet. Pt state she's still in pain.

## 2018-12-06 NOTE — ED Provider Notes (Signed)
[facility]    CSN: [id number] Arrival date & time: 12/06/18  1008     History   Chief Complaint Chief Complaint  Patient presents with  . Appointment    1015  . Neck Pain    HPI Penny [name] is a 38 y.o. female with a history of hypertension-controlled comes to the urgent care with complaints of left-sided neck pain which radiates to the left shoulder.  Patient says symptoms started about 1 week ago and has been persistent.  Symptoms are moderate in intensity.  Aggravated by movement of the neck.  No relieving factors.  Patient has not tried any over-the-counter pain medications.  She recently had a tooth extraction and was prescribed 10 mg Percocets.  No weakness in the upper extremities.  No trauma to the neck.  HPI  Past Medical History:  Diagnosis Date  . ADHD (attention deficit hyperactivity disorder)   . Anxiety    no meds  . Chronic back pain   . Depression    no meds  . Essential hypertension    a. Dx 06/2016.  . Migraines    last one over 1 month ago  . Overweight   . Palpitations - Sinus Tachycardia    a. 12/2016 Echo: E f55-60%, no rwma, nl LA/RA sizes.  . Placenta previa    w/ 2 pregnancies.  . Preterm labor    c/s at 36 wks  . Scoliosis   . Sinus tachycardia     Patient Active Problem List   Diagnosis Date Noted  . Uterine fibroid 01/23/2016  . Right groin pain 01/23/2016  . History of C-section 01/23/2016  . Migraines   . Anxiety   . Depression     Past Surgical History:  Procedure Laterality Date  . CESAREAN SECTION  2010   x 1 at 36 wks in [place]  . CESAREAN SECTION  February 13, 2012  . CHROMOPERTUBATION  10/26/2015   Procedure: ATTEMPTED CHROMOPERTUBATION;  Surgeon: Crawford Givens, MD;  Location: [facility];  Service: Gynecology;;  . LAPAROSCOPIC LYSIS OF ADHESIONS  10/26/2015   Procedure: LAPAROSCOPIC LYSIS OF ADHESIONS;  Surgeon: Crawford Givens, MD;  Location: [facility];  Service: Gynecology;;  . LAPAROSCOPY N/A 10/26/2015   Procedure: LAPAROSCOPY OPERATIVE WITH REMOVAL OF MISPLACED FILSHE CLIP;  Surgeon: Crawford Givens, MD;  Location: [facility];  Service: Gynecology;  Laterality: N/A;  . SVD  2003   x 1 in [place]  . TUBAL LIGATION    . WISDOM TOOTH EXTRACTION      OB History    Gravida  3   Para  3   Term  1   Preterm  2   AB  0   Living  3     SAB  0   TAB  0   Ectopic  0   Multiple  0   Live Births  3            Home Medications    Prior to Admission medications   Medication Sig Start Date End Date Taking? Authorizing Provider  AIMOVIG 140 MG/ML SOAJ Inject 1,140 mg into the skin every 30 (thirty) days.  05/14/18   [provider]  atomoxetine (STRATTERA) 60 MG capsule Take 60 mg by mouth daily. 08/21/18   [provider]  chlorhexidine (HIBICLENS) 4 % external liquid Apply topically daily as needed. Dilute 10-15 mL in water, Use daily when bathing for 1-2 weeks 10/24/18   [name], MD  cyclobenzaprine [facility])  10 MG tablet Take 10 mg by mouth daily as needed for muscle spasms (pain).    [provider]  DULoxetine (CYMBALTA) 30 MG capsule Take 30 mg by mouth daily. Taking with 60 mg daily to equal 90 mg    [provider]  DULoxetine (CYMBALTA) 60 MG capsule Take 60 mg by mouth daily. takin g with 30 mg to equal 90 mg 06/08/17   [provider]  gabapentin (NEURONTIN) 100 MG capsule Take 200 mg by mouth 2 (two) times daily. 08/31/18   [provider]  hydrOXYzine (VISTARIL) 50 MG capsule Take 50 mg by mouth every 8 (eight) hours as needed for anxiety. 08/21/18   [provider]  ibuprofen (ADVIL,MOTRIN) 600 MG tablet Take 1 tablet (600 mg total) by mouth every 6 (six) hours as needed. 10/24/18   [name], MD  LORazepam (ATIVAN) 0.5 MG tablet Take 0.5 mg by mouth 3 (three) times daily as needed for anxiety. 08/21/18   [provider]  metoprolol succinate (TOPROL-XL) 25 MG 24 hr tablet Take 25 mg by  mouth 2 (two) times daily.  03/03/17   [provider]  mirtazapine (REMERON) 30 MG tablet Take 30 mg by mouth at bedtime. 08/21/18   [provider]  RYVENT 6 MG TABS Take 6 mg by mouth daily. 10/06/17   [provider]  Topiramate ER (TROKENDI XR) 200 MG CP24 Take 200 mg by mouth at bedtime.    [provider]    Family History Family History  Problem Relation Age of Onset  . Cancer Mother   . CAD Mother   . Hypertension Sister   . Sickle cell trait Daughter   . Asthma Son   . Asthma Paternal Grandmother   . Pseudochol deficiency Neg Hx   . Malignant hyperthermia Neg Hx   . Anesthesia problems Neg Hx   . Hypotension Neg Hx     Social History Social History   Tobacco Use  . Smoking status: Current Every Day Smoker    Packs/day: 0.20    Years: 18.00    Pack years: 3.60    Types: Cigarettes, E-cigarettes  . Smokeless tobacco: Never Used  Substance Use Topics  . Alcohol use: Yes    Comment: rare  . Drug use: Yes    Types: Marijuana     Allergies   Shrimp [shellfish allergy]   Review of Systems Review of Systems  Constitutional: Negative for activity change, chills and diaphoresis.  HENT: Negative for congestion, dental problem and ear discharge.   Respiratory: Negative for cough, chest tightness and shortness of breath.   Cardiovascular: Negative for chest pain and leg swelling.  Gastrointestinal: Negative for abdominal pain, blood in stool and diarrhea.  Psychiatric/Behavioral: Negative for agitation, behavioral problems and confusion.     Physical Exam Triage Vital Signs ED Triage Vitals  Enc Vitals Group     BP 12/06/18 1042 106/74     Pulse Rate 12/06/18 1042 94     Resp 12/06/18 1042 18     Temp 12/06/18 1042 98.4 F (36.9 C)     Temp Source 12/06/18 1042 Oral     SpO2 12/06/18 1042 98 %     Weight 12/06/18 1041 160 lb (72.6 kg)     Height --      Head Circumference --      Peak Flow --      Pain Score 12/06/18  1040 10     Pain Loc --  Pain Edu? --      Excl. in [place]? --    No data found.  Updated Vital Signs BP 106/74 (BP Location: Right Arm)   Pulse 94   Temp 98.4 F (36.9 C) (Oral)   Resp 18   Wt 72.6 kg   LMP 11/27/2018   SpO2 98%   BMI 29.26 kg/m   Visual Acuity Right Eye Distance:   Left Eye Distance:   Bilateral Distance:    Right Eye Near:   Left Eye Near:    Bilateral Near:     Physical Exam Constitutional:      Appearance: Normal appearance.  HENT:     Head: Normocephalic.     Nose: Nose normal.     Mouth/Throat:     Mouth: Mucous membranes are moist.  Eyes:     Conjunctiva/sclera: Conjunctivae normal.     Pupils: Pupils are equal, round, and reactive to light.  Neck:     Musculoskeletal: Normal range of motion. Muscular tenderness present.  Cardiovascular:     Rate and Rhythm: Regular rhythm. Tachycardia present.  Pulmonary:     Effort: Pulmonary effort is normal.  Abdominal:     General: Abdomen is flat. Bowel sounds are normal.  Lymphadenopathy:     Cervical: No cervical adenopathy.  Neurological:     General: No focal deficit present.     Mental Status: She is alert and oriented to person, place, and time.     Cranial Nerves: No cranial nerve deficit.     Motor: No weakness.     Gait: Gait normal.      UC Treatments / Results  Labs (all labs ordered are listed, but only abnormal results are displayed) Labs Reviewed - No data to display  EKG None  Radiology No results found.  Procedures Procedures (including critical care time)  Medications Ordered in UC Medications  promethazine (PHENERGAN) tablet 12.5 mg (has no administration in time range)    Initial Impression / Assessment and Plan / UC Course  I have reviewed the triage vital signs and the nursing notes.  Pertinent labs & imaging results that were available during my care of the patient were reviewed by me and considered in my medical decision making (see chart for  details).    Musculoskeletal neck pain: Over-the-counter Tylenol after Percocet prescriptions are completed Warm compress on the left side of the neck Patient has no neurologic signs Neck pain will improve Follow-up with primary care physician.  Nausea, recurrent: Phenergan 25 mg orally 3 times daily for 3 days Follow-up with primary care physician  Final Clinical Impressions(s) / UC Diagnoses   Final diagnoses:  None   Discharge Instructions   None    ED Prescriptions    None     Controlled Substance Prescriptions [place] Controlled Substance Registry consulted? No   Chase Picket, MD 12/06/18 [phone]

## 2018-12-07 ENCOUNTER — Encounter (HOSPITAL_COMMUNITY): Payer: Self-pay | Admitting: Emergency Medicine

## 2018-12-07 ENCOUNTER — Emergency Department (HOSPITAL_COMMUNITY)
Admission: EM | Admit: 2018-12-07 | Discharge: 2018-12-08 | Disposition: A | Discharge status: Home / Self Care | Payer: Medicaid Other | Attending: Emergency Medicine | Admitting: Emergency Medicine

## 2018-12-07 ENCOUNTER — Other Ambulatory Visit: Payer: Self-pay

## 2018-12-07 DIAGNOSIS — I10 Essential (primary) hypertension: Secondary | ICD-10-CM | POA: Diagnosis not present

## 2018-12-07 DIAGNOSIS — R112 Nausea with vomiting, unspecified: Secondary | ICD-10-CM | POA: Insufficient documentation

## 2018-12-07 DIAGNOSIS — R109 Unspecified abdominal pain: Secondary | ICD-10-CM

## 2018-12-07 DIAGNOSIS — F17210 Nicotine dependence, cigarettes, uncomplicated: Secondary | ICD-10-CM | POA: Diagnosis not present

## 2018-12-07 DIAGNOSIS — G8929 Other chronic pain: Secondary | ICD-10-CM | POA: Diagnosis not present

## 2018-12-07 DIAGNOSIS — R1032 Left lower quadrant pain: Secondary | ICD-10-CM | POA: Insufficient documentation

## 2018-12-07 DIAGNOSIS — Z79899 Other long term (current) drug therapy: Secondary | ICD-10-CM | POA: Insufficient documentation

## 2018-12-07 DIAGNOSIS — R1031 Right lower quadrant pain: Secondary | ICD-10-CM | POA: Diagnosis not present

## 2018-12-07 LAB — CBC
HCT: 39.1 % (ref 36.0–46.0)
Hemoglobin: 12.9 g/dL (ref 12.0–15.0)
MCH: 27.2 pg (ref 26.0–34.0)
MCHC: 33 g/dL (ref 30.0–36.0)
MCV: 82.5 fL (ref 80.0–100.0)
Platelets: 396 10*3/uL (ref 150–400)
RBC: 4.74 MIL/uL (ref 3.87–5.11)
RDW: 15.9 % — ABNORMAL HIGH (ref 11.5–15.5)
WBC: 13.1 10*3/uL — ABNORMAL HIGH (ref 4.0–10.5)
nRBC: 0 % (ref 0.0–0.2)

## 2018-12-07 LAB — I-STAT BETA HCG BLOOD, ED (MC, WL, AP ONLY): I-stat hCG, quantitative: <5 m[IU]/mL (ref <5)

## 2018-12-07 LAB — URINALYSIS, ROUTINE W REFLEX MICROSCOPIC
Bilirubin Urine: NEGATIVE
Glucose, UA: NEGATIVE mg/dL
Hgb urine dipstick: NEGATIVE
Ketones, ur: 20 mg/dL — AB
Leukocytes, UA: NEGATIVE
Nitrite: NEGATIVE
Protein, ur: NEGATIVE mg/dL
Specific Gravity, Urine: 1.019 (ref 1.005–1.030)
pH: 7 (ref 5.0–8.0)

## 2018-12-07 MED ORDER — SODIUM CHLORIDE 0.9% FLUSH: 3.0000 mL | Freq: Once | INTRAVENOUS | Status: DC

## 2018-12-07 NOTE — ED Triage Notes (Signed)
Pt to triage via [facility].  Pt from shelter.  Reports nausea and vomiting all day with lower abd pain.  Pt had dental extraction on Wednesday.  Not taking antibiotic.

## 2018-12-08 LAB — COMPREHENSIVE METABOLIC PANEL
ALT: 16 U/L (ref 0–44)
AST: 20 U/L (ref 15–41)
Albumin: 4.1 g/dL (ref 3.5–5.0)
Alkaline Phosphatase: 62 U/L (ref 38–126)
Anion gap: 10 (ref 5–15)
BUN: 10 mg/dL (ref 6–20)
CO2: 20 mmol/L — ABNORMAL LOW (ref 22–32)
Calcium: 8.9 mg/dL (ref 8.9–10.3)
Chloride: 109 mmol/L (ref 98–111)
Creatinine, Ser: 0.8 mg/dL (ref 0.44–1.00)
GFR calc Af Amer: >60 mL/min (ref >60)
GFR calc non Af Amer: >60 mL/min (ref >60)
Glucose, Bld: 130 mg/dL — ABNORMAL HIGH (ref 70–99)
Potassium: 3.8 mmol/L (ref 3.5–5.1)
Sodium: 139 mmol/L (ref 135–145)
Total Bilirubin: 0.8 mg/dL (ref 0.3–1.2)
Total Protein: 7.7 g/dL (ref 6.5–8.1)

## 2018-12-08 LAB — LIPASE, BLOOD: Lipase: 40 U/L (ref 11–51)

## 2018-12-08 MED ORDER — SUCRALFATE 1 G PO TABS
1.0000 g | ORAL_TABLET | Freq: Once | ORAL | Status: AC
  Administered 2018-12-08: 1 g via ORAL
  Filled 2018-12-08: qty 1

## 2018-12-08 MED ORDER — KETOROLAC TROMETHAMINE 30 MG/ML IJ SOLN
15.0000 mg | Freq: Once | INTRAMUSCULAR | Status: AC
  Administered 2018-12-08: 15 mg via INTRAVENOUS
  Filled 2018-12-08: qty 1

## 2018-12-08 MED ORDER — SODIUM CHLORIDE 0.9 % IV BOLUS
2000.0000 mL | Freq: Once | INTRAVENOUS | Status: AC
  Administered 2018-12-08: 2000 mL via INTRAVENOUS

## 2018-12-08 MED ORDER — PROMETHAZINE HCL 25 MG/ML IJ SOLN
25.0000 mg | Freq: Once | INTRAMUSCULAR | Status: AC
  Administered 2018-12-08: 25 mg via INTRAVENOUS
  Filled 2018-12-08: qty 1

## 2018-12-08 NOTE — ED Provider Notes (Signed)
[facility] EMERGENCY DEPARTMENT Provider Note   CSN: [id number] Arrival date & time: 12/07/18  2230     History   Chief Complaint Chief Complaint  Patient presents with  . Abdominal Pain  . Emesis    HPI Penny [name] is a 38 y.o. female.  HPI  38 year old female with history of ADHD, chronic back pain, chronic abdominal pain comes in with chief complaint of abdominal pain and vomiting. Patient reports that she has been having abdominal pain with nausea and vomiting for the past 2 days.  She had emesis x10-15 episodes yesterday.  Her emesis is nonbloody and nonbilious.  Her last episode of emesis was at 9:30 PM.  She is also having lower quadrant abdominal pain.  She denies any associated UTI-like symptoms, vaginal discharge, vaginal bleeding.  She states that she has had abdominal pain like this in the past.  No history of small bowel obstruction.  Patient has had multiple C-sections and has history of fibroid.  Patient's BMs have been normal.   Past Medical History:  Diagnosis Date  . ADHD (attention deficit hyperactivity disorder)   . Anxiety    no meds  . Chronic back pain   . Depression    no meds  . Essential hypertension    a. Dx 06/2016.  . Migraines    last one over 1 month ago  . Overweight   . Palpitations - Sinus Tachycardia    a. 12/2016 Echo: E f55-60%, no rwma, nl LA/RA sizes.  . Placenta previa    w/ 2 pregnancies.  . Preterm labor    c/s at 36 wks  . Scoliosis   . Sinus tachycardia     Patient Active Problem List   Diagnosis Date Noted  . Uterine fibroid 01/23/2016  . Right groin pain 01/23/2016  . History of C-section 01/23/2016  . Migraines   . Anxiety   . Depression     Past Surgical History:  Procedure Laterality Date  . CESAREAN SECTION  2010   x 1 at 36 wks in [place]  . CESAREAN SECTION  February 13, 2012  . CHROMOPERTUBATION  10/26/2015   Procedure: ATTEMPTED CHROMOPERTUBATION;  Surgeon: Crawford Givens, MD;   Location: [facility];  Service: Gynecology;;  . LAPAROSCOPIC LYSIS OF ADHESIONS  10/26/2015   Procedure: LAPAROSCOPIC LYSIS OF ADHESIONS;  Surgeon: Crawford Givens, MD;  Location: [facility];  Service: Gynecology;;  . LAPAROSCOPY N/A 10/26/2015   Procedure: LAPAROSCOPY OPERATIVE WITH REMOVAL OF MISPLACED FILSHE CLIP;  Surgeon: Crawford Givens, MD;  Location: [facility];  Service: Gynecology;  Laterality: N/A;  . SVD  2003   x 1 in [place]  . TUBAL LIGATION    . WISDOM TOOTH EXTRACTION       OB History    Gravida  3   Para  3   Term  1   Preterm  2   AB  0   Living  3     SAB  0   TAB  0   Ectopic  0   Multiple  0   Live Births  3            Home Medications    Prior to Admission medications   Medication Sig Start Date End Date Taking? Authorizing Provider  AIMOVIG 140 MG/ML SOAJ Inject 1,140 mg into the skin every 30 (thirty) days.  05/14/18  Yes [provider]  atomoxetine (STRATTERA) 60 MG capsule Take 60 mg by mouth daily.  08/21/18  Yes [provider]  cyclobenzaprine (FLEXERIL) 10 MG tablet Take 10 mg by mouth daily as needed for muscle spasms (pain).   Yes [provider]  DULoxetine (CYMBALTA) 30 MG capsule Take 30 mg by mouth daily. Taking with 60 mg daily to equal 90 mg   Yes [provider]  DULoxetine (CYMBALTA) 60 MG capsule Take 60 mg by mouth daily. takin g with 30 mg to equal 90 mg 06/08/17  Yes [provider]  gabapentin (NEURONTIN) 100 MG capsule Take 200 mg by mouth 2 (two) times daily. 08/31/18  Yes [provider]  hydrOXYzine (VISTARIL) 50 MG capsule Take 50 mg by mouth every 8 (eight) hours as needed for anxiety. 08/21/18  Yes [provider]  ibuprofen (ADVIL,MOTRIN) 800 MG tablet Take 800 mg by mouth 2 (two) times daily.   Yes [provider]  LORazepam (ATIVAN) 0.5 MG tablet Take 0.5 mg by mouth 3 (three) times daily as needed for anxiety. 08/21/18  Yes [provider]  metoprolol  succinate (TOPROL-XL) 25 MG 24 hr tablet Take 25 mg by mouth 2 (two) times daily.  03/03/17  Yes [provider]  mirtazapine (REMERON) 30 MG tablet Take 30 mg by mouth at bedtime. 08/21/18  Yes [provider]  promethazine (PHENERGAN) 25 MG tablet Take 1 tablet (25 mg total) by mouth every 6 (six) hours as needed for up to 3 days for nausea. 12/06/18 12/09/18 Yes Lamptey, Myrene Galas, MD  RYVENT 6 MG TABS Take 6 mg by mouth daily. 10/06/17  Yes [provider]  Topiramate ER (TROKENDI XR) 200 MG CP24 Take 200 mg by mouth at bedtime.   Yes [provider]  chlorhexidine (HIBICLENS) 4 % external liquid Apply topically daily as needed. Dilute 10-15 mL in water, Use daily when bathing for 1-2 weeks Patient not taking: Reported on 12/08/2018 10/24/18   [name], MD  ibuprofen (ADVIL,MOTRIN) 600 MG tablet Take 1 tablet (600 mg total) by mouth every 6 (six) hours as needed. Patient not taking: Reported on 12/08/2018 10/24/18   [name], MD    Family History Family History  Problem Relation Age of Onset  . Cancer Mother   . CAD Mother   . Hypertension Sister   . Sickle cell trait Daughter   . Asthma Son   . Asthma Paternal Grandmother   . Pseudochol deficiency Neg Hx   . Malignant hyperthermia Neg Hx   . Anesthesia problems Neg Hx   . Hypotension Neg Hx     Social History Social History   Tobacco Use  . Smoking status: Current Every Day Smoker    Packs/day: 0.20    Years: 18.00    Pack years: 3.60    Types: Cigarettes, E-cigarettes  . Smokeless tobacco: Never Used  Substance Use Topics  . Alcohol use: Yes    Comment: rare  . Drug use: Yes    Types: Marijuana     Allergies   Shrimp [shellfish allergy]   Review of Systems Review of Systems  Constitutional: Positive for activity change.  Gastrointestinal: Positive for abdominal pain, nausea and vomiting.  Genitourinary: Negative for dysuria.  Allergic/Immunologic: Negative for  immunocompromised state.  Hematological: Does not bruise/bleed easily.  All other systems reviewed and are negative.    Physical Exam Updated Vital Signs BP 116/85   Pulse (!) 103   Temp 100.2 F (37.9 C) (Oral)   Resp 18   LMP 11/27/2018   SpO2 98%  Physical Exam Vitals signs and nursing note reviewed.  Constitutional:      Appearance: She is well-developed.  HENT:     Head: Normocephalic and atraumatic.  Neck:     Musculoskeletal: Normal range of motion and neck supple.  Cardiovascular:     Rate and Rhythm: Tachycardia present.  Pulmonary:     Effort: Pulmonary effort is normal.  Abdominal:     General: Bowel sounds are normal.     Tenderness: There is generalized abdominal tenderness and tenderness in the right lower quadrant, suprapubic area and left lower quadrant.  Skin:    General: Skin is warm and dry.  Neurological:     Mental Status: She is alert and oriented to person, place, and time.      ED Treatments / Results  Labs (all labs ordered are listed, but only abnormal results are displayed) Labs Reviewed  COMPREHENSIVE METABOLIC PANEL - Abnormal; Notable for the following components:      Result Value   CO2 20 (*)    Glucose, Bld 130 (*)    All other components within normal limits  CBC - Abnormal; Notable for the following components:   WBC 13.1 (*)    RDW 15.9 (*)    All other components within normal limits  URINALYSIS, ROUTINE W REFLEX MICROSCOPIC - Abnormal; Notable for the following components:   APPearance CLOUDY (*)    Ketones, ur 20 (*)    All other components within normal limits  LIPASE, BLOOD  I-STAT BETA HCG BLOOD, ED ([facility], [facility], [facility] ONLY)    EKG None  Radiology No results found.  Procedures Procedures (including critical care time)  Medications Ordered in ED Medications  sodium chloride flush (NS) 0.9 % injection 3 mL (3 mLs Intravenous Not Given 12/08/18 0208)  sodium chloride 0.9 % bolus 2,000 mL (0 mLs Intravenous Stopped  12/08/18 0631)  promethazine (PHENERGAN) injection 25 mg (25 mg Intravenous Given 12/08/18 0306)  ketorolac (TORADOL) 30 MG/ML injection 15 mg (15 mg Intravenous Given 12/08/18 0306)  sucralfate (CARAFATE) tablet 1 g (1 g Oral Given 12/08/18 0253)     Initial Impression / Assessment and Plan / ED Course  I have reviewed the triage vital signs and the nursing notes.  Pertinent labs & imaging results that were available during my care of the patient were reviewed by me and considered in my medical decision making (see chart for details).    Patient comes in with chief complaint of abdominal pain. Patient states that her abdominal pain started 2 or 3 days ago.  Pain is not new for her.  She has no UTI-like symptoms.  Patient is having nausea with vomiting, but her BM has been normal.  On exam she is having lower quadrant tenderness, without any rebound or guarding.  Labs overall are reassuring.  Exam is also overall reassuring.  Plan is to monitor patient closely and control her symptoms better.  If she fails oral challenge or if her symptoms get worse and we will consider CT scan.  Reassessment: Patient was reassessed at 4:00 and then again at 530.  She was resting comfortably.  Her heart rate had improved to 102.  She did not have any episode of emesis after our evaluation.  The abdominal exam is unchanged.  Given that patient has history of same type of pain in the past, chances are this likely is not pelvic pain because of infectious process or GU etiology.  Patient is ready for discharge.  We  discussed strict ER return precautions and she is comfortable with the plan.  Final Clinical Impressions(s) / ED Diagnoses   Final diagnoses:  Chronic abdominal pain  Non-intractable vomiting with nausea, unspecified vomiting type    ED Discharge Orders    None       [name], MD 12/08/18 [phone]

## 2018-12-08 NOTE — Discharge Instructions (Addendum)
All the results in the ER are normal, labs and imaging. We are not sure what is causing your symptoms. The workup in the ER is not complete, and is limited to screening for life threatening and emergent conditions only, so please see a primary care doctor for further evaluation.  

## 2018-12-12 ENCOUNTER — Emergency Department (HOSPITAL_COMMUNITY): Payer: Medicaid Other

## 2018-12-12 ENCOUNTER — Encounter (HOSPITAL_COMMUNITY): Payer: Self-pay | Admitting: Family Medicine

## 2018-12-12 ENCOUNTER — Emergency Department (HOSPITAL_COMMUNITY)
Admission: EM | Admit: 2018-12-12 | Discharge: 2018-12-12 | Disposition: A | Discharge status: Home / Self Care | Payer: Medicaid Other | Attending: Emergency Medicine | Admitting: Emergency Medicine

## 2018-12-12 DIAGNOSIS — W19XXXA Unspecified fall, initial encounter: Secondary | ICD-10-CM

## 2018-12-12 DIAGNOSIS — R0789 Other chest pain: Secondary | ICD-10-CM | POA: Diagnosis not present

## 2018-12-12 DIAGNOSIS — M25521 Pain in right elbow: Secondary | ICD-10-CM

## 2018-12-12 DIAGNOSIS — Z79899 Other long term (current) drug therapy: Secondary | ICD-10-CM | POA: Diagnosis not present

## 2018-12-12 DIAGNOSIS — F17210 Nicotine dependence, cigarettes, uncomplicated: Secondary | ICD-10-CM | POA: Diagnosis not present

## 2018-12-12 DIAGNOSIS — I10 Essential (primary) hypertension: Secondary | ICD-10-CM | POA: Insufficient documentation

## 2018-12-12 DIAGNOSIS — W01198A Fall on same level from slipping, tripping and stumbling with subsequent striking against other object, initial encounter: Secondary | ICD-10-CM | POA: Diagnosis not present

## 2018-12-12 DIAGNOSIS — Y999 Unspecified external cause status: Secondary | ICD-10-CM | POA: Diagnosis not present

## 2018-12-12 DIAGNOSIS — R0781 Pleurodynia: Secondary | ICD-10-CM

## 2018-12-12 DIAGNOSIS — Y9389 Activity, other specified: Secondary | ICD-10-CM | POA: Insufficient documentation

## 2018-12-12 DIAGNOSIS — Y92002 Bathroom of unspecified non-institutional (private) residence single-family (private) house as the place of occurrence of the external cause: Secondary | ICD-10-CM | POA: Diagnosis not present

## 2018-12-12 DIAGNOSIS — M25562 Pain in left knee: Secondary | ICD-10-CM | POA: Diagnosis not present

## 2018-12-12 MED ORDER — OXYCODONE-ACETAMINOPHEN 5-325 MG PO TABS: 2.0000 | ORAL_TABLET | ORAL | 0 refills | Status: DC | PRN

## 2018-12-12 MED ORDER — OXYCODONE-ACETAMINOPHEN 5-325 MG PO TABS
1.0000 | ORAL_TABLET | Freq: Once | ORAL | Status: AC
  Administered 2018-12-12: 1 via ORAL
  Filled 2018-12-12: qty 1

## 2018-12-12 NOTE — ED Triage Notes (Signed)
[facility] and transported via [facility]. Patient had a fall in the bathroom. Patient is complaining of right rib, right arm, and right elbow pain. According to EMS, patient has no bruising or deformity. However, EMS braced her right side. Denies any LOC, neck pain, or new back from her usual chronic back.

## 2018-12-12 NOTE — ED Notes (Signed)
Called Ortho tech@[id number]  for long arm splint .

## 2018-12-12 NOTE — ED Provider Notes (Signed)
[facility] DEPT Provider Note   CSN: [id number] Arrival date & time: 12/12/18  0227     History   Chief Complaint Chief Complaint  Patient presents with  . Fall    HPI Penny [name] is a 38 y.o. female with a history of anxiety, ADHD, chronic back pain, depression, HTN, and scoliosis presents to the emergency department with a chief complaint of fall.  The patient reports that she slipped while stepping out of the bathtub earlier tonight.  She states that she hit her left knee, right elbow, and right ribs on the ground.  She denies hitting her head, LOC, nausea, or emesis.  She was able to get up and ambulate after the fall.  In the ER, she endorses persistent gradually worsening, constant left knee, right rib, and right elbow pain.  No history of previous injury or surgery.  She denies neck pain, back pain, shortness of breath, abdominal pain, right lower extremity or left upper extremity pain, numbness, or weakness.  Has been able to walk but it is painful.  She states that she is unable to straighten her right elbow.  Pain is worse with pronation. She has an abrasion to the right elbow.   No treatment prior to arrival.  She does not take any blood thinners.  She is right-hand dominant.  The history is provided by the patient. No language interpreter was used.    Past Medical History:  Diagnosis Date  . ADHD (attention deficit hyperactivity disorder)   . Anxiety    no meds  . Chronic back pain   . Depression    no meds  . Essential hypertension    a. Dx 06/2016.  . Migraines    last one over 1 month ago  . Overweight   . Palpitations - Sinus Tachycardia    a. 12/2016 Echo: E f55-60%, no rwma, nl LA/RA sizes.  . Placenta previa    w/ 2 pregnancies.  . Preterm labor    c/s at 36 wks  . Scoliosis   . Sinus tachycardia     Patient Active Problem List   Diagnosis Date Noted  . Uterine fibroid 01/23/2016  . Right groin pain 01/23/2016   . History of C-section 01/23/2016  . Migraines   . Anxiety   . Depression     Past Surgical History:  Procedure Laterality Date  . CESAREAN SECTION  2010   x 1 at 36 wks in [place]  . CESAREAN SECTION  February 13, 2012  . CHROMOPERTUBATION  10/26/2015   Procedure: ATTEMPTED CHROMOPERTUBATION;  Surgeon: Crawford Givens, MD;  Location: [facility];  Service: Gynecology;;  . LAPAROSCOPIC LYSIS OF ADHESIONS  10/26/2015   Procedure: LAPAROSCOPIC LYSIS OF ADHESIONS;  Surgeon: Crawford Givens, MD;  Location: [facility];  Service: Gynecology;;  . LAPAROSCOPY N/A 10/26/2015   Procedure: LAPAROSCOPY OPERATIVE WITH REMOVAL OF MISPLACED FILSHE CLIP;  Surgeon: Crawford Givens, MD;  Location: [facility];  Service: Gynecology;  Laterality: N/A;  . SVD  2003   x 1 in [place]  . TUBAL LIGATION    . WISDOM TOOTH EXTRACTION       OB History    Gravida  3   Para  3   Term  1   Preterm  2   AB  0   Living  3     SAB  0   TAB  0   Ectopic  0   Multiple  0   Live Births  3            Home Medications    Prior to Admission medications   Medication Sig Start Date End Date Taking? Authorizing Provider  AIMOVIG 140 MG/ML SOAJ Inject 1,140 mg into the skin every 30 (thirty) days.  05/14/18   [provider]  atomoxetine (STRATTERA) 60 MG capsule Take 60 mg by mouth daily. 08/21/18   [provider]  chlorhexidine (HIBICLENS) 4 % external liquid Apply topically daily as needed. Dilute 10-15 mL in water, Use daily when bathing for 1-2 weeks Patient not taking: Reported on 12/08/2018 10/24/18   [name], MD  cyclobenzaprine (FLEXERIL) 10 MG tablet Take 10 mg by mouth daily as needed for muscle spasms (pain).    [provider]  DULoxetine (CYMBALTA) 30 MG capsule Take 30 mg by mouth daily. Taking with 60 mg daily to equal 90 mg    [provider]  DULoxetine (CYMBALTA) 60 MG capsule Take 60 mg by mouth daily. takin g with 30 mg to equal 90 mg 06/08/17   [provider]  gabapentin (NEURONTIN) 100 MG capsule Take 200 mg by mouth 2 (two) times daily. 08/31/18   [provider]  hydrOXYzine (VISTARIL) 50 MG capsule Take 50 mg by mouth every 8 (eight) hours as needed for anxiety. 08/21/18   [provider]  ibuprofen (ADVIL,MOTRIN) 600 MG tablet Take 1 tablet (600 mg total) by mouth every 6 (six) hours as needed. Patient not taking: Reported on 12/08/2018 10/24/18   [name], MD  ibuprofen (ADVIL,MOTRIN) 800 MG tablet Take 800 mg by mouth 2 (two) times daily.    [provider]  LORazepam (ATIVAN) 0.5 MG tablet Take 0.5 mg by mouth 3 (three) times daily as needed for anxiety. 08/21/18   [provider]  metoprolol succinate (TOPROL-XL) 25 MG 24 hr tablet Take 25 mg by mouth 2 (two) times daily.  03/03/17   [provider]  mirtazapine (REMERON) 30 MG tablet Take 30 mg by mouth at bedtime. 08/21/18   [provider]  oxyCODONE-acetaminophen (PERCOCET/ROXICET) 5-325 MG tablet Take 2 tablets by mouth every 4 (four) hours as needed for severe pain. 12/12/18   Cotey Rakes A, PA-C  promethazine (PHENERGAN) 25 MG tablet Take 1 tablet (25 mg total) by mouth every 6 (six) hours as needed for up to 3 days for nausea. 12/06/18 12/09/18  [name], MD  RYVENT 6 MG TABS Take 6 mg by mouth daily. 10/06/17   [provider]  Topiramate ER (TROKENDI XR) 200 MG CP24 Take 200 mg by mouth at bedtime.    [provider]    Family History Family History  Problem Relation Age of Onset  . Cancer Mother   . CAD Mother   . Hypertension Sister   . Sickle cell trait Daughter   . Asthma Son   . Asthma Paternal Grandmother   . Pseudochol deficiency Neg Hx   . Malignant hyperthermia Neg Hx   . Anesthesia problems Neg Hx   . Hypotension Neg Hx     Social History Social History   Tobacco Use  . Smoking status: Current Every Day Smoker    Packs/day: 0.10    Years: 18.00    Pack years:  1.80    Types: Cigarettes  . Smokeless tobacco: Never Used  Substance Use Topics  . Alcohol use: Yes    Comment: Rare   . Drug use: Not Currently    Types: Marijuana  Allergies   Shrimp [shellfish allergy]   Review of Systems Review of Systems  Constitutional: Negative for activity change, chills and fever.  HENT: Negative for congestion.   Respiratory: Negative for shortness of breath.   Cardiovascular: Negative for chest pain.  Gastrointestinal: Negative for abdominal pain, diarrhea, nausea and vomiting.  Genitourinary: Negative for dysuria and urgency.  Musculoskeletal: Positive for arthralgias, joint swelling and myalgias. Negative for back pain, gait problem, neck pain and neck stiffness.  Skin: Positive for wound. Negative for rash.  Allergic/Immunologic: Negative for immunocompromised state.  Neurological: Negative for dizziness, weakness, numbness and headaches.  Psychiatric/Behavioral: Negative for confusion.   Physical Exam Updated Vital Signs BP 138/86 (BP Location: Left Arm)   Pulse 79   Temp (!) 97.4 F (36.3 C) (Oral)   Resp 16   Ht 5\' 6"  (1.676 m)   Wt 72.6 kg   LMP 11/27/2018   SpO2 98%   BMI 25.82 kg/m   Physical Exam Vitals signs and nursing note reviewed.  Constitutional:      General: She is not in acute distress. HENT:     Head: Normocephalic.  Eyes:     Conjunctiva/sclera: Conjunctivae normal.  Neck:     Musculoskeletal: Neck supple.  Cardiovascular:     Rate and Rhythm: Normal rate and regular rhythm.     Heart sounds: No murmur. No friction rub. No gallop.   Pulmonary:     Effort: Pulmonary effort is normal. No respiratory distress.     Breath sounds: No stridor. No wheezing, rhonchi or rales.     Comments: Diffusely tender to palpation throughout the right lateral ribs.  No crepitus or step-offs.  No splinting.  Clear and equal breath sounds bilaterally. Chest:     Chest wall: Tenderness present.  Abdominal:     General: There  is no distension.     Palpations: Abdomen is soft. There is no mass.     Tenderness: There is no abdominal tenderness. There is no right CVA tenderness, left CVA tenderness, guarding or rebound.     Hernia: No hernia is present.  Musculoskeletal:        General: Tenderness and signs of injury present. No deformity.     Comments: Tender to palpation to the right elbow.  There is a superficial abrasion noted to the posterior aspect of the right elbow.  Decreased active and passive range of motion of the right elbow secondary to pain.  Normal exam of the right wrist and shoulder.  Sensation is intact and equal throughout the bilateral upper extremities.  Radial pulses are 2+ and symmetric.  No point tenderness to the right elbow.  There is a contusion noted to the patella of the left knee.  Full active and passive range of motion of the left knee, hip, and ankle.  Sensation is intact and equal throughout the bilateral lower extremities.  5-5 strength against resistance with dorsiflexion plantarflexion.  DP and PT pulses are 2+ and symmetric.  Able to bear weight and ambulate without difficulty on the bilateral lower extremities.  No tenderness to the cervical, thoracic, or lumbar spinous processes or bilateral paraspinal muscles.  Skin:    General: Skin is warm.     Capillary Refill: Capillary refill takes less than 2 seconds.     Findings: No rash.  Neurological:     Mental Status: She is alert.  Psychiatric:        Behavior: Behavior normal.      ED Treatments /  Results  Labs (all labs ordered are listed, but only abnormal results are displayed) Labs Reviewed - No data to display  EKG None  Radiology Dg Ribs Unilateral W/chest Right  Result Date: 12/12/2018 CLINICAL DATA:  Fall in the bathtub tonight with right elbow and forearm pain. Right lateral rib pain. EXAM: RIGHT RIBS AND CHEST - 3+ VIEW COMPARISON:  None. FINDINGS: No fracture or other bone lesions are seen involving the  ribs. There is no evidence of pneumothorax or pleural effusion. Both lungs are clear. Heart size and mediastinal contours are within normal limits. Moderate S-shaped scoliotic curvature of spine. IMPRESSION: 1. No evidence of rib fracture or acute abnormality. 2. Moderate thoracic scoliosis. Electronically Signed   By: Keith Rake M.D.   On: 12/12/2018 03:56   Dg Elbow Complete Right  Result Date: 12/12/2018 CLINICAL DATA:  Fall in the bathtub tonight with right elbow and forearm pain. Right lateral rib pain. EXAM: RIGHT ELBOW - COMPLETE 3+ VIEW COMPARISON:  None. FINDINGS: No dislocation. No visualized fracture, however there is a joint effusion. The alignment and joint spaces are maintained. IMPRESSION: No visualized elbow fracture, however there is an elbow joint effusion. This can be seen with radiographically occult fracture. Consider follow-up radiographs in 7-10 days. Electronically Signed   By: Keith Rake M.D.   On: 12/12/2018 03:58   Dg Forearm Right  Result Date: 12/12/2018 CLINICAL DATA:  Fall in the bathtub tonight with right elbow and forearm pain. Right lateral rib pain. EXAM: RIGHT FOREARM - 2 VIEW COMPARISON:  Concurrent elbow radiographs. FINDINGS: Cortical margins of the radius and ulna are intact. No acute fracture. Elbow better assessed on dedicated elbow exam. Wrist alignment is maintained. IMPRESSION: No fracture of the right forearm. Electronically Signed   By: Keith Rake M.D.   On: 12/12/2018 03:54    Procedures Procedures (including critical care time)  Medications Ordered in ED Medications  oxyCODONE-acetaminophen (PERCOCET/ROXICET) 5-325 MG per tablet 1 tablet (1 tablet Oral Given 12/12/18 3557)     Initial Impression / Assessment and Plan / ED Course  I have reviewed the triage vital signs and the nursing notes.  Pertinent labs & imaging results that were available during my care of the patient were reviewed by me and considered in my medical  decision making (see chart for details).     38 year old female presenting to the ER after a mechanical fall out of her bathtub onto the floor earlier tonight.  She did not hit her head or have a syncopal episode.  She is endorsing right elbow, rib, and left knee pain in the ER. X-ray of the right elbow with an elbow joint effusion, concerning with a radiographically occult fracture.  Follow-up x-ray is recommended in 7 to 10 days.  She is neurovascularly intact on exam.  Will place the patient in a right long-arm splint and sling with orthopedics follow-up.  X-ray of the right ribs and left knee are unremarkable.  She is ambulatory without difficulty.  Will discharge home with pain control and home education for right rib contusion have been given.  Doubt right rib fracture or lung bony injury to the left knee.  Strict return precautions given.  She is hemodynamically stable and in no acute distress.  She is safe for discharge home with outpatient follow-up at this time.  Final Clinical Impressions(s) / ED Diagnoses   Final diagnoses:  Fall, initial encounter  Right elbow pain  Rib pain on right side  Acute pain of  left knee    ED Discharge Orders         Ordered    oxyCODONE-acetaminophen (PERCOCET/ROXICET) 5-325 MG tablet  Every 4 hours PRN     12/12/18 0629           Joline Maxcy A, PA-C 12/12/18 Neibert, Delice Bison, DO 12/12/18 [phone]

## 2018-12-12 NOTE — Progress Notes (Signed)
Orthopedic Tech Progress Note Patient Details:  Penny [name] Aug 24, 1981 [id number]  Ortho Devices Type of Ortho Device: Arm sling, Post (long arm) splint Ortho Device/Splint Location: rue Ortho Device/Splint Interventions: Ordered, Application, Adjustment   Post Interventions Patient Tolerated: Well Instructions Provided: Care of device, Adjustment of device   Penny [name] 12/12/2018, 6:22 AM

## 2018-12-12 NOTE — Discharge Instructions (Addendum)
Thank you for allowing me to care for you today in the Emergency Department.   Please call to schedule a follow-up appointment with orthopedics.  They will want to see you within 1 week.  For mild to moderate pain, take 650 mg of Tylenol once every 6 hours.  For severe pain, take 1 tablet of Percocet every 8 hours.  Do not drive and use caution with working until you know how this medication impacts you.  Is a narcotic.  It can be addicting.  Do not take it with other substances that can make you drowsy.  Continue to try to take deep breaths.  You can sleep with a pillow underneath your right ribs to help re-see if you have the cough.  It is important to try to continue to regularly take deep breaths.  You should return to the emergency department if you develop significant shortness of breath, high fever, cough with thick, sputum and shortness of breath, if your fingertips turn blue, if you have another fall or injury, or other new, concerning symptoms.

## 2018-12-14 ENCOUNTER — Other Ambulatory Visit: Payer: Self-pay

## 2018-12-14 ENCOUNTER — Encounter (HOSPITAL_COMMUNITY): Payer: Self-pay | Admitting: Emergency Medicine

## 2018-12-14 ENCOUNTER — Ambulatory Visit (HOSPITAL_COMMUNITY)
Admission: EM | Admit: 2018-12-14 | Discharge: 2018-12-14 | Disposition: A | Discharge status: Home / Self Care | Payer: Medicaid Other | Attending: Family Medicine | Admitting: Family Medicine

## 2018-12-14 DIAGNOSIS — G8929 Other chronic pain: Secondary | ICD-10-CM

## 2018-12-14 DIAGNOSIS — M25421 Effusion, right elbow: Secondary | ICD-10-CM

## 2018-12-14 DIAGNOSIS — M25522 Pain in left elbow: Secondary | ICD-10-CM

## 2018-12-14 NOTE — Discharge Instructions (Addendum)
We are redoing your splint here today Follow up with orthopedics as planned next week.

## 2018-12-14 NOTE — ED Triage Notes (Signed)
PT has a splint on right arm that is bothering her. PT reports splint is itching and painful

## 2018-12-14 NOTE — Progress Notes (Signed)
Orthopedic Tech Progress Note Patient Details:  Penny [name] [birth year] [id number]  Ortho Devices Type of Ortho Device: Long arm splint Ortho Device/Splint Location: Right Arm  Ortho Device/Splint Interventions: Ordered, Application, Adjustment   Post Interventions Patient Tolerated: Well Instructions Provided: Care of device, Adjustment of device   Cyndia Degraff J Dyanne Yorks 12/14/2018, 6:10 PM

## 2018-12-16 ENCOUNTER — Ambulatory Visit (HOSPITAL_COMMUNITY)
Admission: EM | Admit: 2018-12-16 | Discharge: 2018-12-16 | Disposition: A | Discharge status: Home / Self Care | Payer: Medicaid Other | Attending: Family Medicine | Admitting: Family Medicine

## 2018-12-16 ENCOUNTER — Encounter (HOSPITAL_COMMUNITY): Payer: Self-pay | Admitting: Emergency Medicine

## 2018-12-16 DIAGNOSIS — M25521 Pain in right elbow: Secondary | ICD-10-CM | POA: Diagnosis not present

## 2018-12-16 DIAGNOSIS — R0781 Pleurodynia: Secondary | ICD-10-CM | POA: Diagnosis not present

## 2018-12-16 MED ORDER — ONDANSETRON 4 MG PO TBDP
ORAL_TABLET | ORAL | Status: AC
  Filled 2018-12-16: qty 1

## 2018-12-16 MED ORDER — IBUPROFEN 800 MG PO TABS: 800.0000 mg | ORAL_TABLET | Freq: Three times a day (TID) | ORAL | 3 refills | Status: DC | PRN

## 2018-12-16 MED ORDER — HYDROMORPHONE HCL 1 MG/ML IJ SOLN
INTRAMUSCULAR | Status: AC
  Filled 2018-12-16: qty 0.5

## 2018-12-16 MED ORDER — ONDANSETRON HCL 4 MG/2ML IJ SOLN
INTRAMUSCULAR | Status: AC
  Filled 2018-12-16: qty 2

## 2018-12-16 MED ORDER — ONDANSETRON HCL 4 MG/2ML IJ SOLN: 4.0000 mg | Freq: Once | INTRAMUSCULAR | Status: DC

## 2018-12-16 MED ORDER — ONDANSETRON 4 MG PO TBDP
4.0000 mg | ORAL_TABLET | Freq: Once | ORAL | Status: AC
  Administered 2018-12-16: 4 mg via ORAL

## 2018-12-16 MED ORDER — HYDROMORPHONE HCL 1 MG/ML IJ SOLN
0.5000 mg | Freq: Once | INTRAMUSCULAR | Status: AC
  Administered 2018-12-16: 0.5 mg via INTRAMUSCULAR

## 2018-12-16 NOTE — ED Provider Notes (Signed)
Penny [name]    CSN: [id number] Arrival date & time: 12/14/18  1620     History   Chief Complaint Chief Complaint  Patient presents with  . Appointment  . Follow-up    HPI [name] Penny [name] is a 38 y.o. female.   Patient is a 38 year old female who presents with splint problem. She was seen in the ER a few days ago for fall. She had multiple x rays and was found to have right elbow joint effusion and questionable fracture. She was then placed in a posterior splint and told to follow up with ortho. She is here because her splint is painful, digging in her arm and itching. She would like to have it removed and re done if possible. She denies any loss os sensation, swelling or color change.   ROS per HPI      Past Medical History:  Diagnosis Date  . ADHD (attention deficit hyperactivity disorder)   . Anxiety    no meds  . Chronic back pain   . Depression    no meds  . Essential hypertension    a. Dx 06/2016.  . Migraines    last one over 1 month ago  . Overweight   . Palpitations - Sinus Tachycardia    a. 12/2016 Echo: E f55-60%, no rwma, nl LA/RA sizes.  . Placenta previa    w/ 2 pregnancies.  . Preterm labor    c/s at 36 wks  . Scoliosis   . Sinus tachycardia     Patient Active Problem List   Diagnosis Date Noted  . Uterine fibroid 01/23/2016  . Right groin pain 01/23/2016  . History of C-section 01/23/2016  . Migraines   . Anxiety   . Depression     Past Surgical History:  Procedure Laterality Date  . CESAREAN SECTION  2010   x 1 at 36 wks in [place]  . CESAREAN SECTION  February 13, 2012  . CHROMOPERTUBATION  10/26/2015   Procedure: ATTEMPTED CHROMOPERTUBATION;  Surgeon: Crawford Givens, MD;  Location: [facility];  Service: Gynecology;;  . LAPAROSCOPIC LYSIS OF ADHESIONS  10/26/2015   Procedure: LAPAROSCOPIC LYSIS OF ADHESIONS;  Surgeon: Crawford Givens, MD;  Location: [facility];  Service: Gynecology;;  . LAPAROSCOPY N/A 10/26/2015   Procedure:  LAPAROSCOPY OPERATIVE WITH REMOVAL OF MISPLACED FILSHE CLIP;  Surgeon: Crawford Givens, MD;  Location: [facility];  Service: Gynecology;  Laterality: N/A;  . SVD  2003   x 1 in [place]  . TUBAL LIGATION    . WISDOM TOOTH EXTRACTION      OB History    Gravida  3   Para  3   Term  1   Preterm  2   AB  0   Living  3     SAB  0   TAB  0   Ectopic  0   Multiple  0   Live Births  3            Home Medications    Prior to Admission medications   Medication Sig Start Date End Date Taking? Authorizing Provider  AIMOVIG 140 MG/ML SOAJ Inject 1,140 mg into the skin every 30 (thirty) days.  05/14/18   [provider]  atomoxetine (STRATTERA) 60 MG capsule Take 60 mg by mouth daily. 08/21/18   [provider]  chlorhexidine (HIBICLENS) 4 % external liquid Apply topically daily as needed. Dilute 10-15 mL in water, Use daily when bathing for 1-2 weeks Patient  not taking: Reported on 12/08/2018 10/24/18   [name], MD  cyclobenzaprine (FLEXERIL) 10 MG tablet Take 10 mg by mouth daily as needed for muscle spasms (pain).    [provider]  DULoxetine (CYMBALTA) 30 MG capsule Take 30 mg by mouth daily. Taking with 60 mg daily to equal 90 mg    [provider]  DULoxetine (CYMBALTA) 60 MG capsule Take 60 mg by mouth daily. takin g with 30 mg to equal 90 mg 06/08/17   [provider]  gabapentin (NEURONTIN) 100 MG capsule Take 200 mg by mouth 2 (two) times daily. 08/31/18   [provider]  hydrOXYzine (VISTARIL) 50 MG capsule Take 50 mg by mouth every 8 (eight) hours as needed for anxiety. 08/21/18   [provider]  ibuprofen (ADVIL,MOTRIN) 600 MG tablet Take 1 tablet (600 mg total) by mouth every 6 (six) hours as needed. Patient not taking: Reported on 12/08/2018 10/24/18   [name], MD  ibuprofen (ADVIL,MOTRIN) 800 MG tablet Take 800 mg by mouth 2 (two) times daily.    [provider]  LORazepam (ATIVAN)  0.5 MG tablet Take 0.5 mg by mouth 3 (three) times daily as needed for anxiety. 08/21/18   [provider]  metoprolol succinate (TOPROL-XL) 25 MG 24 hr tablet Take 25 mg by mouth 2 (two) times daily.  03/03/17   [provider]  mirtazapine (REMERON) 30 MG tablet Take 30 mg by mouth at bedtime. 08/21/18   [provider]  oxyCODONE-acetaminophen (PERCOCET/ROXICET) 5-325 MG tablet Take 2 tablets by mouth every 4 (four) hours as needed for severe pain. 12/12/18   McDonald, [name], PA-C  promethazine (PHENERGAN) 25 MG tablet Take 1 tablet (25 mg total) by mouth every 6 (six) hours as needed for up to 3 days for nausea. 12/06/18 12/09/18  [name], MD  RYVENT 6 MG TABS Take 6 mg by mouth daily. 10/06/17   [provider]  Topiramate ER (TROKENDI XR) 200 MG CP24 Take 200 mg by mouth at bedtime.    [provider]    Family History Family History  Problem Relation Age of Onset  . Cancer Mother   . CAD Mother   . Hypertension Sister   . Sickle cell trait Daughter   . Asthma Son   . Asthma Paternal Grandmother   . Pseudochol deficiency Neg Hx   . Malignant hyperthermia Neg Hx   . Anesthesia problems Neg Hx   . Hypotension Neg Hx     Social History Social History   Tobacco Use  . Smoking status: Current Every Day Smoker    Packs/day: 0.10    Years: 18.00    Pack years: 1.80    Types: Cigarettes  . Smokeless tobacco: Never Used  Substance Use Topics  . Alcohol use: Yes    Comment: Rare   . Drug use: Not Currently    Types: Marijuana     Allergies   Shrimp [shellfish allergy]   Review of Systems Review of Systems   Physical Exam Triage Vital Signs ED Triage Vitals  Enc Vitals Group     BP 12/14/18 1702 123/71     Pulse Rate 12/14/18 1702 93     Resp 12/14/18 1702 16     Temp 12/14/18 1702 98.5 F (36.9 C)     Temp Source 12/14/18 1702 Oral     SpO2 12/14/18 1702 96 %     Weight --      Height --  Head  Circumference --      Peak Flow --      Pain Score 12/14/18 1704 5     Pain Loc --      Pain Edu? --      Excl. in [place]? --    No data found.  Updated Vital Signs BP 123/71   Pulse 93   Temp 98.5 F (36.9 C) (Oral)   Resp 16   LMP 11/27/2018   SpO2 96%   Visual Acuity Right Eye Distance:   Left Eye Distance:   Bilateral Distance:    Right Eye Near:   Left Eye Near:    Bilateral Near:     Physical Exam Vitals signs and nursing note reviewed.  Constitutional:      Appearance: Normal appearance.  HENT:     Head: Normocephalic and atraumatic.     Nose: Nose normal.  Neck:     Musculoskeletal: Normal range of motion.  Pulmonary:     Effort: Pulmonary effort is normal.  Musculoskeletal:     Comments: Splint in place on the right arm.  She is able to wiggle her fingers and there is no swelling or color change.   Skin:    General: Skin is warm and dry.     Coloration: Skin is not pale.     Findings: No bruising or erythema.  Neurological:     Mental Status: She is alert.  Psychiatric:        Mood and Affect: Mood normal.      UC Treatments / Results  Labs (all labs ordered are listed, but only abnormal results are displayed) Labs Reviewed - No data to display  EKG None  Radiology No results found.  Procedures Procedures (including critical care time)  Medications Ordered in UC Medications - No data to display  Initial Impression / Assessment and Plan / UC Course  I have reviewed the triage vital signs and the nursing notes.  Pertinent labs & imaging results that were available during my care of the patient were reviewed by me and considered in my medical decision making (see chart for details).     Splint appears tight and shifted, there is a piece at the upper arm that is digging in her arm. There is no signs of compartment syndrome.  We will have ortho redo her splint for comfort.  Splint redone in the clinic and pt feeling much better She  plans to follow up with ortho next week.  Final Clinical Impressions(s) / UC Diagnoses   Final diagnoses:  Elbow effusion, right  Elbow pain, chronic, left     Discharge Instructions     We are redoing your splint here today Follow up with orthopedics as planned next week.     ED Prescriptions    None     Controlled Substance Prescriptions [place] Controlled Substance Registry consulted? no   Orvan July, NP 12/16/18 1324

## 2018-12-16 NOTE — ED Provider Notes (Signed)
[facility]    CSN: [id number] Arrival date & time: 12/16/18  1322     History   Chief Complaint Chief Complaint  Patient presents with  . Appointment  . Hand Problem    HPI Penny [name] is a 38 y.o. female.   HPI Patient had a fall on 12/12/2018 She states that she fell in her own home.  She was seen in the emergency room on that date.  She complained of rib pain on the right side, right elbow pain, acute pain of left knee.  X-rays failed to show any fractures.  Her left elbow films did show an effusion with a questionable occult fracture, not identified.  She was placed in a posterior splint.  Given medicines for pain.  Asked to follow-up with orthopedics. She came back on 12/14/2018.  She was seen because of her splint feeling to tight.  States her hand was swollen.  Ortho was called and she had a new splint placed.  She is back again today with the same complaint.  She states her fingers as well in.  She states the pain is intolerable.  She states that even with taking Percocet and ibuprofen 800 that she cannot sleep.  She is tearful.  She is irritable.  She is requesting stronger pain medication.  She is had no new injury.  No new fall. She denies any history of substance abuse, alcohol abuse.  Past Medical History:  Diagnosis Date  . ADHD (attention deficit hyperactivity disorder)   . Anxiety    no meds  . Chronic back pain   . Depression    no meds  . Essential hypertension    a. Dx 06/2016.  . Migraines    last one over 1 month ago  . Overweight   . Palpitations - Sinus Tachycardia    a. 12/2016 Echo: E f55-60%, no rwma, nl LA/RA sizes.  . Placenta previa    w/ 2 pregnancies.  . Preterm labor    c/s at 36 wks  . Scoliosis   . Sinus tachycardia     Patient Active Problem List   Diagnosis Date Noted  . Uterine fibroid 01/23/2016  . Right groin pain 01/23/2016  . History of C-section 01/23/2016  . Migraines   . Anxiety   . Depression     Past  Surgical History:  Procedure Laterality Date  . CESAREAN SECTION  2010   x 1 at 36 wks in [place]  . CESAREAN SECTION  February 13, 2012  . CHROMOPERTUBATION  10/26/2015   Procedure: ATTEMPTED CHROMOPERTUBATION;  Surgeon: Crawford Givens, MD;  Location: [facility];  Service: Gynecology;;  . LAPAROSCOPIC LYSIS OF ADHESIONS  10/26/2015   Procedure: LAPAROSCOPIC LYSIS OF ADHESIONS;  Surgeon: Crawford Givens, MD;  Location: [facility];  Service: Gynecology;;  . LAPAROSCOPY N/A 10/26/2015   Procedure: LAPAROSCOPY OPERATIVE WITH REMOVAL OF MISPLACED FILSHE CLIP;  Surgeon: Crawford Givens, MD;  Location: [facility];  Service: Gynecology;  Laterality: N/A;  . SVD  2003   x 1 in [place]  . TUBAL LIGATION    . WISDOM TOOTH EXTRACTION      OB History    Gravida  3   Para  3   Term  1   Preterm  2   AB  0   Living  3     SAB  0   TAB  0   Ectopic  0   Multiple  0   Live Births  3            Home Medications    Prior to Admission medications   Medication Sig Start Date End Date Taking? Authorizing Provider  AIMOVIG 140 MG/ML SOAJ Inject 1,140 mg into the skin every 30 (thirty) days.  05/14/18   [provider]  atomoxetine (STRATTERA) 60 MG capsule Take 60 mg by mouth daily. 08/21/18   [provider]  cyclobenzaprine (FLEXERIL) 10 MG tablet Take 10 mg by mouth daily as needed for muscle spasms (pain).    [provider]  DULoxetine (CYMBALTA) 30 MG capsule Take 30 mg by mouth daily. Taking with 60 mg daily to equal 90 mg    [provider]  DULoxetine (CYMBALTA) 60 MG capsule Take 60 mg by mouth daily. takin g with 30 mg to equal 90 mg 06/08/17   [provider]  gabapentin (NEURONTIN) 100 MG capsule Take 200 mg by mouth 2 (two) times daily. 08/31/18   [provider]  hydrOXYzine (VISTARIL) 50 MG capsule Take 50 mg by mouth every 8 (eight) hours as needed for anxiety. 08/21/18   [provider]  ibuprofen (ADVIL,MOTRIN) 800 MG tablet  Take 800 mg by mouth 2 (two) times daily.    [provider]  ibuprofen (ADVIL,MOTRIN) 800 MG tablet Take 1 tablet (800 mg total) by mouth every 8 (eight) hours as needed for moderate pain. 12/16/18   Raylene Everts, MD  LORazepam (ATIVAN) 0.5 MG tablet Take 0.5 mg by mouth 3 (three) times daily as needed for anxiety. 08/21/18   [provider]  metoprolol succinate (TOPROL-XL) 25 MG 24 hr tablet Take 25 mg by mouth 2 (two) times daily.  03/03/17   [provider]  mirtazapine (REMERON) 30 MG tablet Take 30 mg by mouth at bedtime. 08/21/18   [provider]  oxyCODONE-acetaminophen (PERCOCET/ROXICET) 5-325 MG tablet Take 2 tablets by mouth every 4 (four) hours as needed for severe pain. 12/12/18   McDonald, [name], PA-C  promethazine (PHENERGAN) 25 MG tablet Take 1 tablet (25 mg total) by mouth every 6 (six) hours as needed for up to 3 days for nausea. 12/06/18 12/09/18  [name], MD  RYVENT 6 MG TABS Take 6 mg by mouth daily. 10/06/17   [provider]  Topiramate ER (TROKENDI XR) 200 MG CP24 Take 200 mg by mouth at bedtime.    [provider]    Family History Family History  Problem Relation Age of Onset  . Cancer Mother   . CAD Mother   . Hypertension Sister   . Sickle cell trait Daughter   . Asthma Son   . Asthma Paternal Grandmother   . Pseudochol deficiency Neg Hx   . Malignant hyperthermia Neg Hx   . Anesthesia problems Neg Hx   . Hypotension Neg Hx     Social History Social History   Tobacco Use  . Smoking status: Current Every Day Smoker    Packs/day: 0.10    Years: 18.00    Pack years: 1.80    Types: Cigarettes  . Smokeless tobacco: Never Used  Substance Use Topics  . Alcohol use: Yes    Comment: Rare   . Drug use: Not Currently    Types: Marijuana     Allergies   Shrimp [shellfish allergy]   Review of Systems Review of Systems  Constitutional: Negative for chills and fever.  HENT: Negative for ear  pain and sore throat.   Eyes: Negative  for pain and visual disturbance.  Respiratory: Negative for cough and shortness of breath.   Cardiovascular: Positive for chest pain. Negative for palpitations.  Gastrointestinal: Negative for abdominal pain and vomiting.  Genitourinary: Negative for dysuria and hematuria.  Musculoskeletal: Positive for arthralgias and gait problem. Negative for back pain.  Skin: Negative for color change and rash.  Neurological: Negative for seizures and syncope.  All other systems reviewed and are negative.    Physical Exam Triage Vital Signs ED Triage Vitals [12/16/18 1410]  Enc Vitals Group     BP 137/89     Pulse Rate 92     Resp 18     Temp 98.2 F (36.8 C)     Temp src      SpO2 100 %     Weight      Height      Head Circumference      Peak Flow      Pain Score 8     Pain Loc      Pain Edu?      Excl. in [place]?    No data found.  Updated Vital Signs BP 137/89   Pulse 92   Temp 98.2 F (36.8 C)   Resp 18   LMP 11/27/2018   SpO2 100%       Physical Exam Constitutional:      General: She is in acute distress.     Appearance: She is well-developed. She is obese.     Comments: Tearful.  Holding arm close to body.  HENT:     Head: Normocephalic and atraumatic.  Eyes:     Conjunctiva/sclera: Conjunctivae normal.     Pupils: Pupils are equal, round, and reactive to light.  Neck:     Musculoskeletal: Normal range of motion.  Cardiovascular:     Rate and Rhythm: Normal rate.  Pulmonary:     Effort: Pulmonary effort is normal. No respiratory distress.     Breath sounds: Normal breath sounds.     Comments: Lungs are clear.  Right lower ribs are tender.  No visible bruising.  No fever or cough symptoms. Abdominal:     General: There is no distension.     Palpations: Abdomen is soft.  Musculoskeletal: Normal range of motion.     Comments: Right arm is in a posterior splint.  Fingers show no swelling to my inspection.  She moves them  well.  Sensations intact.  Skin:    General: Skin is warm and dry.  Neurological:     General: No focal deficit present.     Mental Status: She is alert. Mental status is at baseline.    I removed the posterior splint.  Inspected the skin.  She has a Band-Aid on her olecranon process.  No surrounding cellulitis or skin infection.  She vocalizes and pulls away with any touching of her arm from the shoulder to the wrist.  She complains bitterly of inspection, wrapping with cotton padding, replacing the brace, and rewrapping.  I did cut about 2 inches off the end of the brace that went up into her fingers so that she would have a brace that ended at the wrist instead of mid phalanx.  I tried to include include the wrist so she would have wrist immobilization.  She sees orthopedist tomorrow, I think there is little chance that she will move the elbow too much in the next 24 hours.  UC Treatments / Results  Labs (all labs ordered are  listed, but only abnormal results are displayed) Labs Reviewed - No data to display  EKG None  Radiology No results found.  Procedures Procedures (including critical care time)  Medications Ordered in UC Medications  HYDROmorphone (DILAUDID) injection 0.5 mg (0.5 mg Intramuscular Given 12/16/18 1446)  ondansetron (ZOFRAN-ODT) disintegrating tablet 4 mg (4 mg Oral Given 12/16/18 1448)    Initial Impression / Assessment and Plan / UC Course  I have reviewed the triage vital signs and the nursing notes.  Pertinent labs & imaging results that were available during my care of the patient were reviewed by me and considered in my medical decision making (see chart for details).     The patient complains bitterly of severe pain.  She states that Percocet is working.  I gave her 1 injection of Dilaudid for pain.  She also received Zofran for nausea.  Within 20 minutes she expressed that she was feeling much better. Final Clinical Impressions(s) / UC Diagnoses    Final diagnoses:  Arthralgia of right upper arm  Rib pain on right side     Discharge Instructions     Do not use right arm Elevate as much as possible Use ice to area for 20 minutes every 2-4 hours Take the oxycodone 10 mg every 6 hours as needed for severe pain Take ibuprofen 800 mg 3 times a day for less severe pain Take Phenergan as needed for nausea See your orthopedist tomorrow   ED Prescriptions    Medication Sig Dispense Auth. Provider   ibuprofen (ADVIL,MOTRIN) 800 MG tablet Take 1 tablet (800 mg total) by mouth every 8 (eight) hours as needed for moderate pain. 90 tablet Raylene Everts, MD     Controlled Substance Prescriptions [place] Controlled Substance Registry consulted? Not Applicable   Raylene Everts, MD 12/16/18 1650

## 2018-12-16 NOTE — Discharge Instructions (Addendum)
Do not use right arm Elevate as much as possible Use ice to area for 20 minutes every 2-4 hours Take the oxycodone 10 mg every 6 hours as needed for severe pain Take ibuprofen 800 mg 3 times a day for less severe pain Take Phenergan as needed for nausea See your orthopedist tomorrow

## 2018-12-16 NOTE — ED Triage Notes (Signed)
Pt here for problem with her splint on R hand, states her hand is "swollen".

## 2018-12-26 ENCOUNTER — Encounter (HOSPITAL_COMMUNITY): Payer: Self-pay | Admitting: Emergency Medicine

## 2018-12-26 ENCOUNTER — Ambulatory Visit (HOSPITAL_COMMUNITY)
Admission: EM | Admit: 2018-12-26 | Discharge: 2018-12-26 | Disposition: A | Discharge status: Home / Self Care | Payer: Medicaid Other | Attending: Internal Medicine | Admitting: Internal Medicine

## 2018-12-26 ENCOUNTER — Other Ambulatory Visit: Payer: Self-pay

## 2018-12-26 DIAGNOSIS — M542 Cervicalgia: Secondary | ICD-10-CM
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
## 2018-12-28 ENCOUNTER — Ambulatory Visit: Payer: Medicaid Other | Attending: Orthopaedic Surgery | Admitting: Physical Therapy

## 2018-12-28 ENCOUNTER — Other Ambulatory Visit: Payer: Self-pay

## 2018-12-28 ENCOUNTER — Encounter: Payer: Self-pay | Admitting: Physical Therapy

## 2018-12-28 DIAGNOSIS — M546 Pain in thoracic spine: Secondary | ICD-10-CM

## 2018-12-28 DIAGNOSIS — M6281 Muscle weakness (generalized): Secondary | ICD-10-CM | POA: Diagnosis present

## 2018-12-28 DIAGNOSIS — M25521 Pain in right elbow: Secondary | ICD-10-CM

## 2018-12-28 DIAGNOSIS — R600 Localized edema: Secondary | ICD-10-CM

## 2018-12-28 NOTE — Therapy (Signed)
[place], [place], [address] Phone: [phone]   Fax:  [phone]  Physical Therapy Evaluation  Patient Details  Name: Penny [name] MRN: [id number] Date of Birth: [birth year] Referring Provider (PT):  Erle Crocker, MD   Encounter Date: 12/28/2018  PT End of Session - 12/28/18 1103    Visit Number  1    Number of Visits  13    Date for PT Re-Evaluation  02/22/19    Authorization Type  MCD: Resubmit at 4th visit    PT Start Time  1100    PT Stop Time  1145    PT Time Calculation (min)  45 min    Activity Tolerance  Patient tolerated treatment well    Behavior During Therapy  [facility] for tasks assessed/performed       Past Medical History:  Diagnosis Date  . ADHD (attention deficit hyperactivity disorder)   . Anxiety    no meds  . Chronic back pain   . Depression    no meds  . Essential hypertension    a. Dx 06/2016.  . Migraines    last one over 1 month ago  . Overweight   . Palpitations - Sinus Tachycardia    a. 12/2016 Echo: E f55-60%, no rwma, nl LA/RA sizes.  . Placenta previa    w/ 2 pregnancies.  . Preterm labor    c/s at 36 wks  . Scoliosis   . Sinus tachycardia     Past Surgical History:  Procedure Laterality Date  . CESAREAN SECTION  2010   x 1 at 36 wks in [place]  . CESAREAN SECTION  February 13, 2012  . CHROMOPERTUBATION  10/26/2015   Procedure: ATTEMPTED CHROMOPERTUBATION;  Surgeon: Crawford Givens, MD;  Location: [facility];  Service: Gynecology;;  . LAPAROSCOPIC LYSIS OF ADHESIONS  10/26/2015   Procedure: LAPAROSCOPIC LYSIS OF ADHESIONS;  Surgeon: Crawford Givens, MD;  Location: [facility];  Service: Gynecology;;  . LAPAROSCOPY N/A 10/26/2015   Procedure: LAPAROSCOPY OPERATIVE WITH REMOVAL OF MISPLACED FILSHE CLIP;  Surgeon: Crawford Givens, MD;  Location: [facility];  Service: Gynecology;  Laterality: N/A;  . SVD  2003   x 1 in [place]  . TUBAL LIGATION    . WISDOM TOOTH  EXTRACTION      There were no vitals filed for this visit.   Subjective Assessment - 12/28/18 1110    Subjective  pt is a 38 y.o F with CC of R elbow / rib pain that occured from a fall when she was getting out of the tub on 12/12/2018 and landed on the floor. She noted immediate pain inthe R elbow and along the lower ribs. she reports going to the ED and was given a compression sleeve. She reports pain seems to stay the same.     Limitations  Lifting;Standing;Walking;Other (comment)   Laying down   How long can you sit comfortably?  10 min     How long can you stand comfortably?  10 min     How long can you walk comfortably?  10 min     Diagnostic tests  12/12/2018 Ribs/ Elbow    Patient Stated Goals  to get out of pain, to be able to lay down flat    Currently in Pain?  Yes    Pain Score  5    at worst 10/10   Pain Location  Elbow    Pain Orientation  Right  Pain Descriptors / Indicators  Aching;Sore   irritating   Pain Type  Chronic pain    Pain Onset  More than a month ago    Pain Frequency  Constant    Aggravating Factors   movement the elbow around, supination/ pronation, picking up itmes and rotation    Pain Relieving Factors  wearing compression, hot water soaking    Effect of Pain on Daily Activities  limited RUE use    Multiple Pain Sites  Yes    Pain Score  8    Pain Location  Rib cage    Pain Orientation  Right    Pain Descriptors / Indicators  Aching;Sore    Pain Type  Chronic pain    Pain Onset  More than a month ago    Pain Frequency  Intermittent    Aggravating Factors   deep breathing, moving around, standing/ laying down, sitting    Pain Relieving Factors  soaking in hot water    Effect of Pain on Daily Activities  limited movement    Pain Location  Rib cage    Pain Orientation  Right         [facility] PT Assessment - 12/28/18 1121      Assessment   Medical Diagnosis  R elbow swelling, Rib contusion    Referring Provider (PT)   Erle Crocker, MD     Onset Date/Surgical Date  12/12/18    Hand Dominance  Right    Next MD Visit  01/28/2019    Prior Therapy  yes      Precautions   Precautions  --    Precaution Comments  no lifting over 5#, rotate sitting/ standing every hour      Balance Screen   Has the patient fallen in the past 6 months  Yes    How many times?  1    Has the patient had a decrease in activity level because of a fear of falling?   No    Is the patient reluctant to leave their home because of a fear of falling?   No      Home Environment   Living Environment  Private residence    Available Help at Discharge  Other (Comment)   currently in a shelter, plans to go to a house soon   Type of [place] - single point   back, knee brace, elbow sleeve     Prior Function   Level of Independence  Independent with basic ADLs    Vocation  Unemployed      Cognition   Overall Cognitive Status  Within Functional Limits for tasks assessed      Observation/Other Assessments   Quick DASH   81.82%      ROM / Strength   AROM / PROM / Strength  AROM;Strength;PROM      AROM   AROM Assessment Site  Elbow;Forearm;Wrist    Right/Left Elbow  Right;Left    Right Elbow Flexion  110    Right Elbow Extension  7    Left Elbow Flexion  140    Left Elbow Extension  0    Right/Left Forearm  Right;Left    Right Forearm Pronation  --   WFL pain during motion    Right Forearm Supination  --   WFL pain during motion    Right/Left Wrist  --   WFL     PROM   PROM  Assessment Site  Elbow;Forearm    Right/Left Elbow  Right;Left    Right Elbow Flexion  127    Right/Left Forearm  Right;Left      Strength   Strength Assessment Site  Hand;Elbow;Forearm;Wrist    Right/Left Elbow  Right;Left    Right Elbow Flexion  3/5   due to pain   Right Elbow Extension  3/5   due to pain   Left Elbow Flexion  4+/5    Left Elbow Extension  4+/5    Right/Left Forearm  Right;Left    Right Forearm Pronation  3+/5     Right Forearm Supination  3/5    Left Forearm Pronation  5/5    Left Forearm Supination  5/5    Right/Left Wrist  Right;Left    Right Wrist Flexion  4/5    Right Wrist Extension  4/5    Left Wrist Flexion  4+/5    Left Wrist Extension  4+/5    Right Hand Grip (lbs)  12   11,12,13   Left Hand Grip (lbs)  64   64,65,63     Palpation   Palpation comment  TTP along the olecranon process and medial epicondyle, and pain at the greater tubercle along the R ribs T8-T10             Quick Dash - 12/28/18 0001    Open a tight or new jar  Unable    Do heavy household chores (wash walls, wash floors)  Severe difficulty    Carry a shopping bag or briefcase  Unable    Wash your back  Unable    Use a knife to cut food  Severe difficulty    Recreational activities in which you take some force or impact through your arm, shoulder, or hand (golf, hammering, tennis)  Unable    During the past week, to what extent has your arm, shoulder or hand problem interfered with your normal social activities with family, friends, neighbors, or groups?  Quite a bit    During the past week, to what extent has your arm, shoulder or hand problem limited your work or other regular daily activities  Quite a bit    Arm, shoulder, or hand pain.  Severe    Tingling (pins and needles) in your arm, shoulder, or hand  Severe    Difficulty Sleeping  Moderate difficulty    DASH Score  81.82 %        Objective measurements completed on examination: See above findings.              PT Education - 12/28/18 1200    Education Details  evaluation findings, POC, goals, HEP with form/ rationale, laying posture/ positioning.    Person(s) Educated  Patient    Methods  Explanation;Verbal cues;Handout;Demonstration    Comprehension  Verbalized understanding;Verbal cues required       PT Short Term Goals - 12/28/18 1211      PT SHORT TERM GOAL #1   Title  pt to be I with intial HEP     Baseline  no previous  HEP    Time  3    Period  Weeks    Status  New    Target Date  01/18/19      PT SHORT TERM GOAL #2   Title  pt to verbalize and demo proper posture and lifting mechanics to reduce and prevent elbow and rib pain     Baseline  no knowledge of  proper posture    Time  3    Period  Weeks    Status  New    Target Date  01/18/19      PT SHORT TERM GOAL #3   Title  increase R grip strength by >/= 10# to demo improvement in elbow function    Baseline  inital measure 12 #    Time  3    Period  Weeks    Status  New    Target Date  01/18/19        PT Long Term Goals - 12/28/18 1213      PT LONG TERM GOAL #1   Title  pt to increase R elbow ROM WFL compared bil with </= 1/10 pain for functional ROM    Baseline  R elbow flexion 110, extension 7 degrees    Time  6    Period  Weeks    Status  New    Target Date  02/22/19      PT LONG TERM GOAL #2   Title  increase R wrist/ elbow strength to >/= 4/5 with </= 2/10 pain for functional strength required for ADLS     Baseline  R elbow grossly 3/5, and 3+/5 supination, 3/5 pronation    Time  6    Period  Weeks    Status  New    Target Date  02/22/19      PT LONG TERM GOAL #3   Title  increase quick dash score by >/= 15% to demonstrate improvement in overall function    Baseline  81.82% limited    Time  6    Period  Weeks    Status  New    Target Date  02/08/19      PT LONG TERM GOAL #4   Title  pt to be I with all HEP given as of last visit to maintain and progress current level of function     Baseline  no previous HEP    Time  6    Period  Weeks    Status  New    Target Date  02/08/19             Plan - 12/28/18 1202    Clinical Impression Statement  pt present to OPPT with CC of R elbow pain and rib pain following a fall that occurred on 12/12/2018. she demosntrates limited elbow flexion/ extension compared bil and functiona wrist/ forearm motions. significant weakness noted inthe RUE compared bil with pain during  assessment. Pt would benefit from physical therapy to increase elbow ROM/ strength, decrease pain, and return to PLOF by addressing the deficits listed.     History and Personal Factors relevant to plan of care:  hx of anxiety/ depression,     Clinical Presentation  Evolving    Clinical Presentation due to:  limited elbow ROM, elbow pain, rib pain, weakness    Clinical Decision Making  Moderate    Rehab Potential  Good    PT Frequency  2x / week    PT Duration  6 weeks    PT Treatment/Interventions  ADLs/Self Care Home Management;Cryotherapy;Electrical Stimulation;Iontophoresis 4mg /ml Dexamethasone;Moist Heat;Ultrasound;Therapeutic activities;Therapeutic exercise;Taping;Patient/family education;Manual techniques;Passive range of motion;Dry needling;Vasopneumatic Device    PT Next Visit Plan  review/ update HEP STW along tricpe/ bicep and pronators, gentle wrist/ elbow strengthening.     PT Home Exercise Plan  elbow flexion/ extension, wrist flexor/ extensor stretch, gripping    Consulted and Agree with  Plan of Care  Patient       Patient will benefit from skilled therapeutic intervention in order to improve the following deficits and impairments:  Pain, Improper body mechanics, Postural dysfunction, Increased fascial restricitons, Decreased strength, Decreased activity tolerance, Decreased endurance, Decreased range of motion, Impaired UE functional use, Increased edema  Visit Diagnosis: Pain in right elbow  Pain in thoracic spine  Muscle weakness (generalized)  Localized edema     Problem List Patient Active Problem List   Diagnosis Date Noted  . Uterine fibroid 01/23/2016  . Right groin pain 01/23/2016  . History of C-section 01/23/2016  . Migraines   . Anxiety   . Depression    Philisha Weinel PT, DPT, LAT, ATC  12/28/18  12:17 PM      [facility] [address] [place], [place], [address] Phone: [phone]   Fax:   [phone]  Name: JASMYNN PFALZGRAF MRN: [id number] Date of Birth: 1981/05/06

## 2019-01-07 ENCOUNTER — Ambulatory Visit: Payer: Medicaid Other | Admitting: Physical Therapy

## 2019-01-07 ENCOUNTER — Encounter: Payer: Self-pay | Admitting: Physical Therapy

## 2019-01-07 DIAGNOSIS — R600 Localized edema: Secondary | ICD-10-CM

## 2019-01-07 DIAGNOSIS — M546 Pain in thoracic spine: Secondary | ICD-10-CM

## 2019-01-07 DIAGNOSIS — M6281 Muscle weakness (generalized): Secondary | ICD-10-CM

## 2019-01-07 DIAGNOSIS — M25521 Pain in right elbow: Secondary | ICD-10-CM

## 2019-01-07 NOTE — Therapy (Signed)
[place], [place], [address] Phone: [phone]   Fax:  [phone]  Physical Therapy Treatment  Patient Details  Name: Penny [name] MRN: [id number] Date of Birth: Dec 30, 1980 Referring Provider (PT):  Erle Crocker, MD   Encounter Date: 01/07/2019  PT End of Session - 01/07/19 1106    Visit Number  2    Number of Visits  13    Date for PT Re-Evaluation  02/22/19    Authorization Type  MCD: Resubmit at 4th visit    Authorization Time Period  01/06/2019 - 01/26/2019    Authorization - Visit Number  1    Authorization - Number of Visits  3    PT Start Time  1105    Activity Tolerance  Patient tolerated treatment well    Behavior During Therapy  [facility] for tasks assessed/performed       Past Medical History:  Diagnosis Date  . ADHD (attention deficit hyperactivity disorder)   . Anxiety    no meds  . Chronic back pain   . Depression    no meds  . Essential hypertension    a. Dx 06/2016.  . Migraines    last one over 1 month ago  . Overweight   . Palpitations - Sinus Tachycardia    a. 12/2016 Echo: E f55-60%, no rwma, nl LA/RA sizes.  . Placenta previa    w/ 2 pregnancies.  . Preterm labor    c/s at 36 wks  . Scoliosis   . Sinus tachycardia     Past Surgical History:  Procedure Laterality Date  . CESAREAN SECTION  2010   x 1 at 36 wks in [place]  . CESAREAN SECTION  February 13, 2012  . CHROMOPERTUBATION  10/26/2015   Procedure: ATTEMPTED CHROMOPERTUBATION;  Surgeon: Crawford Givens, MD;  Location: [facility];  Service: Gynecology;;  . LAPAROSCOPIC LYSIS OF ADHESIONS  10/26/2015   Procedure: LAPAROSCOPIC LYSIS OF ADHESIONS;  Surgeon: Crawford Givens, MD;  Location: [facility];  Service: Gynecology;;  . LAPAROSCOPY N/A 10/26/2015   Procedure: LAPAROSCOPY OPERATIVE WITH REMOVAL OF MISPLACED FILSHE CLIP;  Surgeon: Crawford Givens, MD;  Location: [facility];  Service: Gynecology;  Laterality: N/A;  . SVD   2003   x 1 in [place]  . TUBAL LIGATION    . WISDOM TOOTH EXTRACTION      There were no vitals filed for this visit.  Subjective Assessment - 01/07/19 1106    Subjective  "I've lost my sling and my sleeve, and I have been using my R arm has been feeling more sore. I worked on sleeping the way that was shown and I am doing better."    Patient Stated Goals  to get out of pain, to be able to lay down flat    Currently in Pain?  Yes    Pain Score  5     Pain Orientation  Right    Pain Descriptors / Indicators  Aching;Sore    Pain Type  Chronic pain    Pain Onset  More than a month ago    Pain Frequency  Intermittent    Aggravating Factors   direct pressure, lifting and carrying    Pain Relieving Factors  exercise     Pain Score  8    Pain Location  Rib cage    Pain Orientation  Right    Pain Descriptors / Indicators  Sore    Pain Onset  More than  a month ago    Pain Frequency  Intermittent    Aggravating Factors   laying down using proper posture    Pain Relieving Factors  sleeping posture and positioning                       [facility] Adult PT Treatment/Exercise - 01/07/19 1118      Elbow Exercises   Elbow Flexion  Supine;10 reps;Theraband   x 2 sets   Theraband Level (Elbow Flexion)  Level 1 (Yellow)    Elbow Extension  Supine;10 reps;Right    Theraband Level (Elbow Extension)  Level 1 (Yellow)      Shoulder Exercises: Supine   External Rotation  Strengthening;Both;10 reps;Theraband    Theraband Level (Shoulder External Rotation)  Level 1 (Yellow)    Internal Rotation  10 reps;Theraband    Theraband Level (Shoulder Internal Rotation)  Level 1 (Yellow)      Shoulder Exercises: ROM/Strengthening   Nustep  L5 x 5 min UE/LE      Shoulder Exercises: Stretch   Other Shoulder Stretches  R QL and intercostal stretching seated leaning to the L      Wrist Exercises   Other wrist exercises  gripping rolled up towel 1 x 10 holding 5 sec      Manual Therapy   Manual  therapy comments  MTPR along the tricep x 2, infraspinatus                PT Short Term Goals - 12/28/18 1211      PT SHORT TERM GOAL #1   Title  pt to be I with intial HEP     Baseline  no previous HEP    Time  3    Period  Weeks    Status  New    Target Date  01/18/19      PT SHORT TERM GOAL #2   Title  pt to verbalize and demo proper posture and lifting mechanics to reduce and prevent elbow and rib pain     Baseline  no knowledge of proper posture    Time  3    Period  Weeks    Status  New    Target Date  01/18/19      PT SHORT TERM GOAL #3   Title  increase R grip strength by >/= 10# to demo improvement in elbow function    Baseline  inital measure 12 #    Time  3    Period  Weeks    Status  New    Target Date  01/18/19        PT Long Term Goals - 12/28/18 1213      PT LONG TERM GOAL #1   Title  pt to increase R elbow ROM WFL compared bil with </= 1/10 pain for functional ROM    Baseline  R elbow flexion 110, extension 7 degrees    Time  6    Period  Weeks    Status  New    Target Date  02/22/19      PT LONG TERM GOAL #2   Title  increase R wrist/ elbow strength to >/= 4/5 with </= 2/10 pain for functional strength required for ADLS     Baseline  R elbow grossly 3/5, and 3+/5 supination, 3/5 pronation    Time  6    Period  Weeks    Status  New    Target Date  02/22/19      PT LONG TERM GOAL #3   Title  increase quick dash score by >/= 15% to demonstrate improvement in overall function    Baseline  81.82% limited    Time  6    Period  Weeks    Status  New    Target Date  02/08/19      PT LONG TERM GOAL #4   Title  pt to be I with all HEP given as of last visit to maintain and progress current level of function     Baseline  no previous HEP    Time  6    Period  Weeks    Status  New    Target Date  02/08/19            Plan - 01/07/19 1213    Clinical Impression Statement  reviewed previous HEp which she reports some consistencey.  STW focused on the tricep and shoulder external rotators followed with strengthening performed in supine. She was able to do all exercises reporting no increas in pain or symptoms at end of the session. provided topical biofreeze to help control pain PRN.     PT Treatment/Interventions  ADLs/Self Care Home Management;Cryotherapy;Electrical Stimulation;Iontophoresis 4mg /ml Dexamethasone;Moist Heat;Ultrasound;Therapeutic activities;Therapeutic exercise;Taping;Patient/family education;Manual techniques;Passive range of motion;Dry needling;Vasopneumatic Device    PT Next Visit Plan  review/ update HEP STW along tricpe/ bicep and pronators, gentle wrist/ elbow strengthening.     PT Home Exercise Plan  elbow flexion/ extension, wrist flexor/ extensor stretch, gripping    Consulted and Agree with Plan of Care  Patient       Patient will benefit from skilled therapeutic intervention in order to improve the following deficits and impairments:  Pain, Improper body mechanics, Postural dysfunction, Increased fascial restricitons, Decreased strength, Decreased activity tolerance, Decreased endurance, Decreased range of motion, Impaired UE functional use, Increased edema  Visit Diagnosis: Pain in right elbow  Pain in thoracic spine  Muscle weakness (generalized)  Localized edema     Problem List Patient Active Problem List   Diagnosis Date Noted  . Uterine fibroid 01/23/2016  . Right groin pain 01/23/2016  . History of C-section 01/23/2016  . Migraines   . Anxiety   . Depression    Gaylin Bulthuis PT, DPT, LAT, ATC  01/07/19  12:18 PM      [facility] [address] [place], [place], [address] Phone: [phone]   Fax:  [phone]  Name: Penny [name] MRN: [id number] Date of Birth: 1981-08-24

## 2019-01-14 ENCOUNTER — Ambulatory Visit: Payer: Medicaid Other | Attending: Orthopaedic Surgery | Admitting: Physical Therapy

## 2019-01-14 DIAGNOSIS — M546 Pain in thoracic spine: Secondary | ICD-10-CM

## 2019-01-14 DIAGNOSIS — R600 Localized edema: Secondary | ICD-10-CM | POA: Diagnosis present

## 2019-01-14 DIAGNOSIS — M6281 Muscle weakness (generalized): Secondary | ICD-10-CM | POA: Insufficient documentation

## 2019-01-14 DIAGNOSIS — M25521 Pain in right elbow: Secondary | ICD-10-CM | POA: Diagnosis present

## 2019-01-14 NOTE — Therapy (Signed)
[place], [place], [address] Phone: [phone]   Fax:  [phone]  Physical Therapy Treatment  Patient Details  Name: Penny [name] MRN: [id number] Date of Birth: 1981-09-22 Referring Provider (PT):  Erle Crocker, MD   Encounter Date: 01/14/2019  PT End of Session - 01/14/19 1106    Visit Number  3    Number of Visits  13    Date for PT Re-Evaluation  02/22/19    Authorization Type  MCD: Resubmit at 4th visit    Authorization Time Period  01/06/2019 - 01/26/2019    Authorization - Visit Number  2    Authorization - Number of Visits  3    PT Start Time  1106    PT Stop Time  1147    PT Time Calculation (min)  41 min    Activity Tolerance  Patient tolerated treatment well    Behavior During Therapy  [facility] for tasks assessed/performed       Past Medical History:  Diagnosis Date  . ADHD (attention deficit hyperactivity disorder)   . Anxiety    no meds  . Chronic back pain   . Depression    no meds  . Essential hypertension    a. Dx 06/2016.  . Migraines    last one over 1 month ago  . Overweight   . Palpitations - Sinus Tachycardia    a. 12/2016 Echo: E f55-60%, no rwma, nl LA/RA sizes.  . Placenta previa    w/ 2 pregnancies.  . Preterm labor    c/s at 36 wks  . Scoliosis   . Sinus tachycardia     Past Surgical History:  Procedure Laterality Date  . CESAREAN SECTION  2010   x 1 at 36 wks in [place]  . CESAREAN SECTION  February 13, 2012  . CHROMOPERTUBATION  10/26/2015   Procedure: ATTEMPTED CHROMOPERTUBATION;  Surgeon: Crawford Givens, MD;  Location: [facility];  Service: Gynecology;;  . LAPAROSCOPIC LYSIS OF ADHESIONS  10/26/2015   Procedure: LAPAROSCOPIC LYSIS OF ADHESIONS;  Surgeon: Crawford Givens, MD;  Location: [facility];  Service: Gynecology;;  . LAPAROSCOPY N/A 10/26/2015   Procedure: LAPAROSCOPY OPERATIVE WITH REMOVAL OF MISPLACED FILSHE CLIP;  Surgeon: Crawford Givens, MD;   Location: [facility];  Service: Gynecology;  Laterality: N/A;  . SVD  2003   x 1 in [place]  . TUBAL LIGATION    . WISDOM TOOTH EXTRACTION      There were no vitals filed for this visit.  Subjective Assessment - 01/14/19 1108    Subjective  "my ribs are doing pretty good, the elbow is being mean"     Patient Stated Goals  to get out of pain, to be able to lay down flat    Currently in Pain?  Yes    Pain Score  5     Pain Location  Elbow    Pain Orientation  Right    Pain Descriptors / Indicators  Aching;Sore    Pain Type  Chronic pain    Pain Onset  More than a month ago    Pain Frequency  Intermittent    Aggravating Factors   direct pressure, lifting and carrying,     Pain Score  0    Pain Location  Rib cage                       [facility] Adult PT Treatment/Exercise - 01/14/19 0001  Self-Care   Self-Care  Other Self-Care Comments    Other Self-Care Comments   trigger point release and how to perofrm on the bicep/ tricep using a tennis ball      Elbow Exercises   Elbow Flexion  Strengthening;Seated;15 reps;Theraband    Theraband Level (Elbow Flexion)  Level 2 (Red)    Elbow Extension  10 reps;Strengthening;Theraband    Theraband Level (Elbow Extension)  Level 3 (Green)      Shoulder Exercises: Supine   Other Supine Exercises  --      Shoulder Exercises: Seated   Other Seated Exercises  scapular retraction 2 x 10 with red theraband      Shoulder Exercises: ROM/Strengthening   UBE (Upper Arm Bike)  L1 x 4 min    changing direction at 2 min     Shoulder Exercises: Stretch   Other Shoulder Stretches  R QL and intercostal stretching seated leaning to the L             PT Education - 01/14/19 1125    Education Details  how to perform MTPR using the tennis.  updated HEP for elbow flexion/ extension    Person(s) Educated  Patient    Methods  Explanation;Verbal cues    Comprehension  Verbalized understanding;Verbal cues required       PT Short Term  Goals - 12/28/18 1211      PT SHORT TERM GOAL #1   Title  pt to be I with intial HEP     Baseline  no previous HEP    Time  3    Period  Weeks    Status  New    Target Date  01/18/19      PT SHORT TERM GOAL #2   Title  pt to verbalize and demo proper posture and lifting mechanics to reduce and prevent elbow and rib pain     Baseline  no knowledge of proper posture    Time  3    Period  Weeks    Status  New    Target Date  01/18/19      PT SHORT TERM GOAL #3   Title  increase R grip strength by >/= 10# to demo improvement in elbow function    Baseline  inital measure 12 #    Time  3    Period  Weeks    Status  New    Target Date  01/18/19        PT Long Term Goals - 12/28/18 1213      PT LONG TERM GOAL #1   Title  pt to increase R elbow ROM WFL compared bil with </= 1/10 pain for functional ROM    Baseline  R elbow flexion 110, extension 7 degrees    Time  6    Period  Weeks    Status  New    Target Date  02/22/19      PT LONG TERM GOAL #2   Title  increase R wrist/ elbow strength to >/= 4/5 with </= 2/10 pain for functional strength required for ADLS     Baseline  R elbow grossly 3/5, and 3+/5 supination, 3/5 pronation    Time  6    Period  Weeks    Status  New    Target Date  02/22/19      PT LONG TERM GOAL #3   Title  increase quick dash score by >/= 15% to demonstrate improvement in overall function  Baseline  81.82% limited    Time  6    Period  Weeks    Status  New    Target Date  02/08/19      PT LONG TERM GOAL #4   Title  pt to be I with all HEP given as of last visit to maintain and progress current level of function     Baseline  no previous HEP    Time  6    Period  Weeks    Status  New    Target Date  02/08/19            Plan - 01/14/19 1149    Clinical Impression Statement  pt reports that her ribs are doing well, her biggest complaint is the R elbow. continued MTPR which she did well with, and worked on teaching how to perform it  at home. continued elbow/ shoulder strengthening which she responded to well. plan to re-assess next visit and assess wether more visits are warranted.     PT Treatment/Interventions  ADLs/Self Care Home Management;Cryotherapy;Electrical Stimulation;Iontophoresis 4mg /ml Dexamethasone;Moist Heat;Ultrasound;Therapeutic activities;Therapeutic exercise;Taping;Patient/family education;Manual techniques;Passive range of motion;Dry needling;Vasopneumatic Device    PT Next Visit Plan  review/ update HEP STW along tricpe/ bicep and pronators, gentle wrist/ elbow strengthening.     PT Home Exercise Plan  elbow flexion/ extension, wrist flexor/ extensor stretch, gripping    Consulted and Agree with Plan of Care  Patient       Patient will benefit from skilled therapeutic intervention in order to improve the following deficits and impairments:  Pain, Improper body mechanics, Postural dysfunction, Increased fascial restricitons, Decreased strength, Decreased activity tolerance, Decreased endurance, Decreased range of motion, Impaired UE functional use, Increased edema  Visit Diagnosis: Pain in right elbow  Pain in thoracic spine  Muscle weakness (generalized)  Localized edema     Problem List Patient Active Problem List   Diagnosis Date Noted  . Uterine fibroid 01/23/2016  . Right groin pain 01/23/2016  . History of C-section 01/23/2016  . Migraines   . Anxiety   . Depression    Iliya Spivack PT, DPT, LAT, ATC  01/14/19  11:53 AM      [facility] [address] [place], [place], [address] Phone: [phone]   Fax:  [phone]  Name: Penny [name] MRN: [id number] Date of Birth: September 08, 1981

## 2019-01-22 ENCOUNTER — Ambulatory Visit: Payer: Medicaid Other | Admitting: Physical Therapy

## 2019-01-22 DIAGNOSIS — R600 Localized edema: Secondary | ICD-10-CM

## 2019-01-22 DIAGNOSIS — M25521 Pain in right elbow: Secondary | ICD-10-CM | POA: Diagnosis not present

## 2019-01-22 DIAGNOSIS — M546 Pain in thoracic spine: Secondary | ICD-10-CM

## 2019-01-22 DIAGNOSIS — M6281 Muscle weakness (generalized): Secondary | ICD-10-CM

## 2019-01-22 NOTE — Therapy (Signed)
[place], [place], [address] Phone: [phone]   Fax:  [phone]  Physical Therapy Treatment / Discharge summary  Patient Details  Name: Penny [name] MRN: [id number] Date of Birth: 1981-10-25 Referring Provider (PT):  Erle Crocker, MD   Encounter Date: 01/22/2019  PT End of Session - 01/22/19 1134    Visit Number  4    Number of Visits  13    Date for PT Re-Evaluation  02/22/19    Authorization Type  MCD: Resubmit at 4th visit    Authorization Time Period  01/06/2019 - 01/26/2019    Authorization - Visit Number  3    Authorization - Number of Visits  3    PT Start Time  1101    PT Stop Time  1130   shortened session due to discharge   PT Time Calculation (min)  29 min    Activity Tolerance  Patient tolerated treatment well       Past Medical History:  Diagnosis Date  . ADHD (attention deficit hyperactivity disorder)   . Anxiety    no meds  . Chronic back pain   . Depression    no meds  . Essential hypertension    a. Dx 06/2016.  . Migraines    last one over 1 month ago  . Overweight   . Palpitations - Sinus Tachycardia    a. 12/2016 Echo: E f55-60%, no rwma, nl LA/RA sizes.  . Placenta previa    w/ 2 pregnancies.  . Preterm labor    c/s at 36 wks  . Scoliosis   . Sinus tachycardia     Past Surgical History:  Procedure Laterality Date  . CESAREAN SECTION  2010   x 1 at 36 wks in [place]  . CESAREAN SECTION  February 13, 2012  . CHROMOPERTUBATION  10/26/2015   Procedure: ATTEMPTED CHROMOPERTUBATION;  Surgeon: Crawford Givens, MD;  Location: [facility];  Service: Gynecology;;  . LAPAROSCOPIC LYSIS OF ADHESIONS  10/26/2015   Procedure: LAPAROSCOPIC LYSIS OF ADHESIONS;  Surgeon: Crawford Givens, MD;  Location: [facility];  Service: Gynecology;;  . LAPAROSCOPY N/A 10/26/2015   Procedure: LAPAROSCOPY OPERATIVE WITH REMOVAL OF MISPLACED FILSHE CLIP;  Surgeon: Crawford Givens, MD;  Location: [facility];  Service: Gynecology;  Laterality: N/A;  . SVD  2003   x 1 in [place]  . TUBAL LIGATION    . WISDOM TOOTH EXTRACTION      There were no vitals filed for this visit.  Subjective Assessment - 01/22/19 1105    Subjective  "i've had some dental work so I am sore"     Patient Stated Goals  to get out of pain, to be able to lay down flat    Currently in Pain?  No/denies    Pain Score  2     Pain Location  Elbow    Pain Orientation  Right    Pain Descriptors / Indicators  Aching;Sore    Pain Frequency  Intermittent    Aggravating Factors   exercise     Pain Score  0    Pain Location  Rib cage    Pain Orientation  Right    Pain Descriptors / Indicators  Aching;Sore    Pain Type  Chronic pain    Aggravating Factors   N/A    Pain Relieving Factors  N/A         [facility] PT Assessment - 01/22/19 0001  Observation/Other Assessments   Quick DASH   34.09%      AROM   Right Elbow Flexion  144    Right Elbow Extension  2      Strength   Right Elbow Flexion  4+/5    Right Elbow Extension  4+/5    Right Forearm Pronation  4/5    Right Hand Grip (lbs)  57.6   57,58,58          Quick Dash - 01/22/19 0001    Open a tight or new jar  Mild difficulty    Do heavy household chores (wash walls, wash floors)  Moderate difficulty    Carry a shopping bag or briefcase  Moderate difficulty    Wash your back  Mild difficulty    Use a knife to cut food  No difficulty    Recreational activities in which you take some force or impact through your arm, shoulder, or hand (golf, hammering, tennis)  Moderate difficulty    During the past week, to what extent has your arm, shoulder or hand problem interfered with your normal social activities with family, friends, neighbors, or groups?  Slightly    During the past week, to what extent has your arm, shoulder or hand problem limited your work or other regular daily activities  Modererately    Arm, shoulder, or hand pain.  Moderate    Tingling  (pins and needles) in your arm, shoulder, or hand  None    Difficulty Sleeping  Moderate difficulty    DASH Score  34.09 %             [facility] Adult PT Treatment/Exercise - 01/22/19 0001      Self-Care   Other Self-Care Comments   reviewed trigger point release techniques      Elbow Exercises   Elbow Flexion  Strengthening;Seated;15 reps;Theraband    Theraband Level (Elbow Flexion)  Level 3 (Green)    Elbow Extension  10 reps;Strengthening;Theraband    Theraband Level (Elbow Extension)  Level 3 (Green)      Shoulder Exercises: Supine   External Rotation  Strengthening;Both;10 reps;Theraband    Theraband Level (Shoulder External Rotation)  Level 2 (Red)    Internal Rotation  10 reps;Theraband    Theraband Level (Shoulder Internal Rotation)  Level 2 (Red)             PT Education - 01/22/19 1133    Education Details  reviewed previous HEp and importance of consistency after discharge. provided additional resistance bands to promote strengthening and endurnace with increased reps/sets.     Person(s) Educated  Patient    Methods  Explanation;Verbal cues;Handout    Comprehension  Verbalized understanding;Verbal cues required       PT Short Term Goals - 01/22/19 1113      PT SHORT TERM GOAL #1   Title  pt to be I with intial HEP     Time  3    Period  Weeks    Status  Achieved      PT SHORT TERM GOAL #2   Title  pt to verbalize and demo proper posture and lifting mechanics to reduce and prevent elbow and rib pain     Time  3    Period  Weeks    Status  Achieved      PT SHORT TERM GOAL #3   Title  increase R grip strength by >/= 10# to demo improvement in elbow function    Period  Weeks    Status  Achieved        PT Long Term Goals - 01/22/19 1113      PT LONG TERM GOAL #1   Title  pt to increase R elbow ROM WFL compared bil with </= 1/10 pain for functional ROM    Time  6    Status  Achieved      PT LONG TERM GOAL #2   Title  increase R wrist/ elbow  strength to >/= 4/5 with </= 2/10 pain for functional strength required for ADLS     Period  Weeks    Status  Achieved      PT LONG TERM GOAL #3   Title  increase quick dash score by >/= 15% to demonstrate improvement in overall function    Baseline  34.09%    Time  6    Period  Weeks    Status  Achieved      PT LONG TERM GOAL #4   Title  pt to be I with all HEP given as of last visit to maintain and progress current level of function     Time  6    Period  Weeks    Status  Achieved            Plan - 01/22/19 1131    Clinical Impression Statement  Valkyrie has made great progress with physical therapy increasing R elbow ROM and additionally reports no rib pain and mild elbow pain. She has met all goals and improved her quickDASH score. she is able to maintain and progress her current level of function independently and will be discharged today.     PT Treatment/Interventions  ADLs/Self Care Home Management;Cryotherapy;Electrical Stimulation;Iontophoresis 66m/ml Dexamethasone;Moist Heat;Ultrasound;Therapeutic activities;Therapeutic exercise;Taping;Patient/family education;Manual techniques;Passive range of motion;Dry needling;Vasopneumatic Device    PT Next Visit Plan  d/C    PT Home Exercise Plan  elbow flexion/ extension, wrist flexor/ extensor stretch, gripping    Consulted and Agree with Plan of Care  Patient       Patient will benefit from skilled therapeutic intervention in order to improve the following deficits and impairments:  Pain, Improper body mechanics, Postural dysfunction, Increased fascial restricitons, Decreased strength, Decreased activity tolerance, Decreased endurance, Decreased range of motion, Impaired UE functional use, Increased edema  Visit Diagnosis: Pain in right elbow  Pain in thoracic spine  Muscle weakness (generalized)  Localized edema     Problem List Patient Active Problem List   Diagnosis Date Noted  . Uterine fibroid 01/23/2016  .  Right groin pain 01/23/2016  . History of C-section 01/23/2016  . Migraines   . Anxiety   . Depression     LStarr Lake3/08/2019, 11:35 AM  [facility] [address]Phone: [phone]  Fax:  [phone] Name: Penny HENAULTMRN: [id number] of Birth: 107-Feb-1982       PHYSICAL THERAPY DISCHARGE SUMMARY  Visits from Start of Care: 4  Current functional level related to goals / functional outcomes: See goals, quick DASH 34.09%   Remaining deficits: Mild intermittent elbow soreness. See assessment   Education / Equipment: HEP, theraband, posture, sleeping positioning  Plan: Patient agrees to discharge.  Patient goals were met. Patient is being discharged due to meeting the stated rehab goals.  ?????    Edmon Magid PT, DPT, LAT, ATC  01/22/19  11:36 AM

## 2019-01-25 ENCOUNTER — Ambulatory Visit (INDEPENDENT_AMBULATORY_CARE_PROVIDER_SITE_OTHER): Payer: Medicaid Other

## 2019-01-25 ENCOUNTER — Encounter (HOSPITAL_COMMUNITY): Payer: Self-pay | Admitting: Emergency Medicine

## 2019-01-25 ENCOUNTER — Ambulatory Visit (HOSPITAL_COMMUNITY)
Admission: EM | Admit: 2019-01-25 | Discharge: 2019-01-25 | Disposition: A | Discharge status: Home / Self Care | Payer: Medicaid Other | Attending: Emergency Medicine | Admitting: Emergency Medicine

## 2019-01-25 DIAGNOSIS — S59901A Unspecified injury of right elbow, initial encounter: Secondary | ICD-10-CM | POA: Diagnosis not present

## 2019-01-25 DIAGNOSIS — W19XXXD Unspecified fall, subsequent encounter: Secondary | ICD-10-CM

## 2019-01-25 DIAGNOSIS — S5001XA Contusion of right elbow, initial encounter: Secondary | ICD-10-CM

## 2019-01-25 DIAGNOSIS — W19XXXA Unspecified fall, initial encounter: Secondary | ICD-10-CM | POA: Diagnosis not present

## 2019-01-25 DIAGNOSIS — Y92009 Unspecified place in unspecified non-institutional (private) residence as the place of occurrence of the external cause: Secondary | ICD-10-CM

## 2019-01-25 MED ORDER — ONDANSETRON HCL 4 MG PO TABS: 4.0000 mg | ORAL_TABLET | Freq: Four times a day (QID) | ORAL | 0 refills | Status: DC

## 2019-01-25 MED ORDER — ONDANSETRON 4 MG PO TBDP
4.0000 mg | ORAL_TABLET | Freq: Once | ORAL | Status: AC
  Administered 2019-01-25: 4 mg via ORAL

## 2019-01-25 NOTE — ED Provider Notes (Signed)
[facility]    CSN: [id number] Arrival date & time: 01/25/19  1504     History   Chief Complaint Chief Complaint  Patient presents with  . Fall    HPI Penny [name] is a 38 y.o. female.   Pt uses an walker and she states that she lost balance and the walker tipped over she slid out of the walker and hit her elbow and her buttocks. No loc, no blurred vision. Has full rom was seen a few weeks ago for the samething . Has not taken anything pta      Past Medical History:  Diagnosis Date  . ADHD (attention deficit hyperactivity disorder)   . Anxiety    no meds  . Chronic back pain   . Depression    no meds  . Essential hypertension    a. Dx 06/2016.  . Migraines    last one over 1 month ago  . Overweight   . Palpitations - Sinus Tachycardia    a. 12/2016 Echo: E f55-60%, no rwma, nl LA/RA sizes.  . Placenta previa    w/ 2 pregnancies.  . Preterm labor    c/s at 36 wks  . Scoliosis   . Sinus tachycardia     Patient Active Problem List   Diagnosis Date Noted  . Uterine fibroid 01/23/2016  . Right groin pain 01/23/2016  . History of C-section 01/23/2016  . Migraines   . Anxiety   . Depression     Past Surgical History:  Procedure Laterality Date  . CESAREAN SECTION  2010   x 1 at 36 wks in [place]  . CESAREAN SECTION  February 13, 2012  . CHROMOPERTUBATION  10/26/2015   Procedure: ATTEMPTED CHROMOPERTUBATION;  Surgeon: Crawford Givens, MD;  Location: [facility];  Service: Gynecology;;  . LAPAROSCOPIC LYSIS OF ADHESIONS  10/26/2015   Procedure: LAPAROSCOPIC LYSIS OF ADHESIONS;  Surgeon: Crawford Givens, MD;  Location: [facility];  Service: Gynecology;;  . LAPAROSCOPY N/A 10/26/2015   Procedure: LAPAROSCOPY OPERATIVE WITH REMOVAL OF MISPLACED FILSHE CLIP;  Surgeon: Crawford Givens, MD;  Location: [facility];  Service: Gynecology;  Laterality: N/A;  . SVD  2003   x 1 in [place]  . TUBAL LIGATION    . WISDOM TOOTH EXTRACTION      OB History    Gravida  3   Para   3   Term  1   Preterm  2   AB  0   Living  3     SAB  0   TAB  0   Ectopic  0   Multiple  0   Live Births  3            Home Medications    Prior to Admission medications   Medication Sig Start Date End Date Taking? Authorizing Provider  AIMOVIG 140 MG/ML SOAJ Inject 1,140 mg into the skin every 30 (thirty) days.  05/14/18   [provider]  atomoxetine (STRATTERA) 60 MG capsule Take 60 mg by mouth daily. 08/21/18   [provider]  cyclobenzaprine (FLEXERIL) 10 MG tablet Take 10 mg by mouth daily as needed for muscle spasms (pain).    [provider]  DULoxetine (CYMBALTA) 30 MG capsule Take 30 mg by mouth daily. Taking with 60 mg daily to equal 90 mg    [provider]  DULoxetine (CYMBALTA) 60 MG capsule Take 60 mg by mouth daily. takin g with 30 mg to equal 90  mg 06/08/17   [provider]  gabapentin (NEURONTIN) 100 MG capsule Take 200 mg by mouth 2 (two) times daily. 08/31/18   [provider]  hydrOXYzine (VISTARIL) 50 MG capsule Take 50 mg by mouth every 8 (eight) hours as needed for anxiety. 08/21/18   [provider]  ibuprofen (ADVIL,MOTRIN) 800 MG tablet Take 800 mg by mouth 2 (two) times daily.    [provider]  ibuprofen (ADVIL,MOTRIN) 800 MG tablet Take 1 tablet (800 mg total) by mouth every 8 (eight) hours as needed for moderate pain. 12/16/18   Raylene Everts, MD  LORazepam (ATIVAN) 0.5 MG tablet Take 0.5 mg by mouth 3 (three) times daily as needed for anxiety. 08/21/18   [provider]  metoprolol succinate (TOPROL-XL) 25 MG 24 hr tablet Take 25 mg by mouth 2 (two) times daily.  03/03/17   [provider]  mirtazapine (REMERON) 30 MG tablet Take 30 mg by mouth at bedtime. 08/21/18   [provider]  ondansetron (ZOFRAN) 4 MG tablet Take 1 tablet (4 mg total) by mouth every 6 (six) hours. 01/25/19   Marney Setting, NP  oxyCODONE-acetaminophen  (PERCOCET/ROXICET) 5-325 MG tablet Take 2 tablets by mouth every 4 (four) hours as needed for severe pain. 12/12/18   McDonald, [name], PA-C  promethazine (PHENERGAN) 25 MG tablet Take 1 tablet (25 mg total) by mouth every 6 (six) hours as needed for up to 3 days for nausea. 12/06/18 12/09/18  [name], MD  RYVENT 6 MG TABS Take 6 mg by mouth daily. 10/06/17   [provider]  Topiramate ER (TROKENDI XR) 200 MG CP24 Take 200 mg by mouth at bedtime.    [provider]    Family History Family History  Problem Relation Age of Onset  . Cancer Mother   . CAD Mother   . Hypertension Sister   . Sickle cell trait Daughter   . Asthma Son   . Asthma Paternal Grandmother   . Pseudochol deficiency Neg Hx   . Malignant hyperthermia Neg Hx   . Anesthesia problems Neg Hx   . Hypotension Neg Hx     Social History Social History   Tobacco Use  . Smoking status: Current Every Day Smoker    Packs/day: 0.10    Years: 18.00    Pack years: 1.80    Types: Cigarettes  . Smokeless tobacco: Never Used  Substance Use Topics  . Alcohol use: Yes    Comment: Rare   . Drug use: Not Currently    Types: Marijuana     Allergies   Shrimp [shellfish allergy]   Review of Systems Review of Systems  Constitutional: Negative.   Eyes: Negative.   Respiratory: Negative.   Cardiovascular: Negative.   Gastrointestinal: Negative.   Musculoskeletal: Positive for joint swelling.       Buttocks pain, rt elbow pain   Skin:       Swelling +2 to rt upper elbow   Neurological: Negative.      Physical Exam Triage Vital Signs ED Triage Vitals  Enc Vitals Group     BP 01/25/19 1628 121/85     Pulse Rate 01/25/19 1628 88     Resp 01/25/19 1628 16     Temp 01/25/19 1628 97.9 F (36.6 C)     Temp Source 01/25/19 1628 Oral     SpO2 01/25/19 1628 100 %     Weight --      Height --  Head Circumference --      Peak Flow --      Pain Score 01/25/19 1626 10     Pain Loc --       Pain Edu? --      Excl. in [place]? --    No data found.  Updated Vital Signs BP 121/85 (BP Location: Left Arm)   Pulse 88   Temp 97.9 F (36.6 C) (Oral)   Resp 16   LMP 01/23/2019   SpO2 100%   Visual Acuity     Physical Exam Cardiovascular:     Rate and Rhythm: Normal rate.  Pulmonary:     Effort: Pulmonary effort is normal.  Abdominal:     General: Abdomen is flat.  Musculoskeletal:        General: Tenderness and signs of injury present.  Skin:    General: Skin is warm.     Capillary Refill: Capillary refill takes less than 2 seconds.  Neurological:     General: No focal deficit present.     Mental Status: She is alert.      UC Treatments / Results  Labs (all labs ordered are listed, but only abnormal results are displayed) Labs Reviewed - No data to display  EKG None  Radiology Dg Elbow Complete Right  Result Date: 01/25/2019 CLINICAL DATA:  Fall today with right elbow pain and swelling EXAM: RIGHT ELBOW - COMPLETE 3+ VIEW COMPARISON:  12/12/2018 right elbow radiograph FINDINGS: There is no evidence of fracture, dislocation, or joint effusion. There is no evidence of arthropathy or other focal bone abnormality. Mild soft tissue swelling in the posterior right elbow. IMPRESSION: Mild posterior right elbow soft tissue swelling, with no fracture, joint effusion or malalignment. Electronically Signed   By: Ilona Sorrel M.D.   On: 01/25/2019 17:24    Procedures Procedures (including critical care time)  Medications Ordered in UC Medications  ondansetron (ZOFRAN-ODT) disintegrating tablet 4 mg (has no administration in time range)    Initial Impression / Assessment and Plan / UC Course  I have reviewed the triage vital signs and the nursing notes.  Pertinent labs & imaging results that were available during my care of the patient were reviewed by me and considered in my medical decision making (see chart for details).     Use heat and ice as needed  Take  tylenol as needed for pain  You already have percocet for pain meds  You was seen for this previously and did not complete follow up  Concern for pt wanting pain medications has been seen for the same complaints asking for pain medication several times  Ask for phenergan with pain meds today. Expressed to pt that we do not offer this here. She can have a zofran while here.  Will need to take NSAIDS to help with the pain.     Final Clinical Impressions(s) / UC Diagnoses   Final diagnoses:  Contusion of right elbow, initial encounter  Fall in home, subsequent encounter     Discharge Instructions     X ray does not show a fracture  Use heat and ice as needed  Take tylenol / motrin as needed for pain  You already have percocet for pain meds  You will need to see orthopedics if you have further pain  Will need to follow up with your primary care doctor     ED Prescriptions    Medication Sig Dispense Auth. Provider   ondansetron (ZOFRAN) 4 MG tablet  Take 1 tablet (4 mg total) by mouth every 6 (six) hours. 12 tablet Marney Setting, NP     Controlled Substance Prescriptions [place] Controlled Substance Registry consulted? Not Applicable   Marney Setting, NP 01/25/19 1728

## 2019-01-25 NOTE — ED Triage Notes (Signed)
Pt present a fall, Pt states she injured her tailbone, right elbow and top of her head. Pt denies any dizziness, or loss of conscious

## 2019-01-25 NOTE — Discharge Instructions (Addendum)
X ray does not show a fracture  Use heat and ice as needed  Take tylenol / motrin as needed for pain  You already have percocet for pain meds  You will need to see orthopedics if you have further pain  Will need to follow up with your primary care doctor

## 2019-01-29 ENCOUNTER — Other Ambulatory Visit: Payer: Self-pay

## 2019-01-29 ENCOUNTER — Ambulatory Visit (HOSPITAL_COMMUNITY)
Admission: EM | Admit: 2019-01-29 | Discharge: 2019-01-29 | Disposition: A | Discharge status: Home / Self Care | Payer: Medicaid Other | Attending: Physician Assistant | Admitting: Physician Assistant

## 2019-01-29 ENCOUNTER — Encounter (HOSPITAL_COMMUNITY): Payer: Self-pay

## 2019-01-29 DIAGNOSIS — S0990XA Unspecified injury of head, initial encounter: Secondary | ICD-10-CM

## 2019-01-29 MED ORDER — PROMETHAZINE HCL 25 MG PO TABS: 25.0000 mg | ORAL_TABLET | Freq: Four times a day (QID) | ORAL | 0 refills | Status: DC | PRN

## 2019-01-29 NOTE — ED Provider Notes (Signed)
[facility]    CSN: [id number] Arrival date & time: 01/29/19  1847     History   Chief Complaint Chief Complaint  Patient presents with  . Fall    HPI Penny [name] is a 38 y.o. female.   The history is provided by the patient. No language interpreter was used.  Fall  This is a new problem. The current episode started more than 2 days ago. The problem occurs constantly. The problem has been gradually worsening. Pertinent negatives include no headaches. Nothing aggravates the symptoms. Nothing relieves the symptoms. She has tried nothing for the symptoms. The treatment provided no relief.   Pt complains of headache after falling 4 days ago.  Pt reports she feel nauseated.  Pt has had a concussion in the past and feels like she may again  Past Medical History:  Diagnosis Date  . ADHD (attention deficit hyperactivity disorder)   . Anxiety    no meds  . Chronic back pain   . Depression    no meds  . Essential hypertension    a. Dx 06/2016.  . Migraines    last one over 1 month ago  . Overweight   . Palpitations - Sinus Tachycardia    a. 12/2016 Echo: E f55-60%, no rwma, nl LA/RA sizes.  . Placenta previa    w/ 2 pregnancies.  . Preterm labor    c/s at 36 wks  . Scoliosis   . Sinus tachycardia     Patient Active Problem List   Diagnosis Date Noted  . Uterine fibroid 01/23/2016  . Right groin pain 01/23/2016  . History of C-section 01/23/2016  . Migraines   . Anxiety   . Depression     Past Surgical History:  Procedure Laterality Date  . CESAREAN SECTION  2010   x 1 at 36 wks in [place]  . CESAREAN SECTION  February 13, 2012  . CHROMOPERTUBATION  10/26/2015   Procedure: ATTEMPTED CHROMOPERTUBATION;  Surgeon: Crawford Givens, MD;  Location: [facility];  Service: Gynecology;;  . LAPAROSCOPIC LYSIS OF ADHESIONS  10/26/2015   Procedure: LAPAROSCOPIC LYSIS OF ADHESIONS;  Surgeon: Crawford Givens, MD;  Location: [facility];  Service: Gynecology;;  . LAPAROSCOPY N/A  10/26/2015   Procedure: LAPAROSCOPY OPERATIVE WITH REMOVAL OF MISPLACED FILSHE CLIP;  Surgeon: Crawford Givens, MD;  Location: [facility];  Service: Gynecology;  Laterality: N/A;  . SVD  2003   x 1 in [place]  . TUBAL LIGATION    . WISDOM TOOTH EXTRACTION      OB History    Gravida  3   Para  3   Term  1   Preterm  2   AB  0   Living  3     SAB  0   TAB  0   Ectopic  0   Multiple  0   Live Births  3            Home Medications    Prior to Admission medications   Medication Sig Start Date End Date Taking? Authorizing Provider  AIMOVIG 140 MG/ML SOAJ Inject 1,140 mg into the skin every 30 (thirty) days.  05/14/18   [provider]  atomoxetine (STRATTERA) 60 MG capsule Take 60 mg by mouth daily. 08/21/18   [provider]  cyclobenzaprine (FLEXERIL) 10 MG tablet Take 10 mg by mouth daily as needed for muscle spasms (pain).    [provider]  DULoxetine (CYMBALTA) 30 MG capsule Take  30 mg by mouth daily. Taking with 60 mg daily to equal 90 mg    [provider]  DULoxetine (CYMBALTA) 60 MG capsule Take 60 mg by mouth daily. takin g with 30 mg to equal 90 mg 06/08/17   [provider]  gabapentin (NEURONTIN) 100 MG capsule Take 200 mg by mouth 2 (two) times daily. 08/31/18   [provider]  hydrOXYzine (VISTARIL) 50 MG capsule Take 50 mg by mouth every 8 (eight) hours as needed for anxiety. 08/21/18   [provider]  ibuprofen (ADVIL,MOTRIN) 800 MG tablet Take 800 mg by mouth 2 (two) times daily.    [provider]  ibuprofen (ADVIL,MOTRIN) 800 MG tablet Take 1 tablet (800 mg total) by mouth every 8 (eight) hours as needed for moderate pain. 12/16/18   Raylene Everts, MD  LORazepam (ATIVAN) 0.5 MG tablet Take 0.5 mg by mouth 3 (three) times daily as needed for anxiety. 08/21/18   [provider]  metoprolol succinate (TOPROL-XL) 25 MG 24 hr tablet Take 25 mg by mouth 2 (two) times daily.  03/03/17    [provider]  mirtazapine (REMERON) 30 MG tablet Take 30 mg by mouth at bedtime. 08/21/18   [provider]  ondansetron (ZOFRAN) 4 MG tablet Take 1 tablet (4 mg total) by mouth every 6 (six) hours. 01/25/19   Marney Setting, NP  oxyCODONE-acetaminophen (PERCOCET/ROXICET) 5-325 MG tablet Take 2 tablets by mouth every 4 (four) hours as needed for severe pain. 12/12/18   McDonald, [name], PA-C  promethazine (PHENERGAN) 25 MG tablet Take 1 tablet (25 mg total) by mouth every 6 (six) hours as needed for nausea or vomiting. 01/29/19   Fransico Meadow, PA-C  RYVENT 6 MG TABS Take 6 mg by mouth daily. 10/06/17   [provider]  Topiramate ER (TROKENDI XR) 200 MG CP24 Take 200 mg by mouth at bedtime.    [provider]    Family History Family History  Problem Relation Age of Onset  . Cancer Mother   . CAD Mother   . Hypertension Sister   . Sickle cell trait Daughter   . Asthma Son   . Asthma Paternal Grandmother   . Pseudochol deficiency Neg Hx   . Malignant hyperthermia Neg Hx   . Anesthesia problems Neg Hx   . Hypotension Neg Hx     Social History Social History   Tobacco Use  . Smoking status: Current Every Day Smoker    Packs/day: 0.10    Years: 18.00    Pack years: 1.80    Types: Cigarettes  . Smokeless tobacco: Never Used  Substance Use Topics  . Alcohol use: Yes    Comment: Rare   . Drug use: Not Currently    Types: Marijuana     Allergies   Shrimp [shellfish allergy]   Review of Systems Review of Systems  Neurological: Negative for headaches.  All other systems reviewed and are negative.    Physical Exam Triage Vital Signs ED Triage Vitals  Enc Vitals Group     BP 01/29/19 1909 107/67     Pulse Rate 01/29/19 1909 72     Resp --      Temp 01/29/19 1909 99.3 F (37.4 C)     Temp Source 01/29/19 1909 Oral     SpO2 01/29/19 1909 97 %     Weight 01/29/19 1907 160 lb (72.6 kg)     Height 01/29/19 1907 5\' 4"  (1.626  m)      Head Circumference --      Peak Flow --      Pain Score 01/29/19 1907 8     Pain Loc --      Pain Edu? --      Excl. in [place]? --    No data found.  Updated Vital Signs BP 107/67 (BP Location: Right Arm)   Pulse 72   Temp 99.3 F (37.4 C) (Oral)   Ht 5\' 4"  (1.626 m)   Wt 72.6 kg   LMP 01/23/2019   SpO2 97%   BMI 27.46 kg/m   Visual Acuity Right Eye Distance:   Left Eye Distance:   Bilateral Distance:    Right Eye Near:   Left Eye Near:    Bilateral Near:     Physical Exam Vitals signs and nursing note reviewed.  Constitutional:      Appearance: She is well-developed.  HENT:     Head: Normocephalic.     Right Ear: Tympanic membrane normal.     Left Ear: Tympanic membrane normal.     Nose: Nose normal.  Eyes:     Pupils: Pupils are equal, round, and reactive to light.  Neck:     Musculoskeletal: Normal range of motion.  Cardiovascular:     Rate and Rhythm: Normal rate.  Pulmonary:     Effort: Pulmonary effort is normal.  Abdominal:     General: There is no distension.  Musculoskeletal: Normal range of motion.  Neurological:     General: No focal deficit present.     Mental Status: She is alert and oriented to person, place, and time.  Psychiatric:        Mood and Affect: Mood normal.      UC Treatments / Results  Labs (all labs ordered are listed, but only abnormal results are displayed) Labs Reviewed - No data to display  EKG None  Radiology No results found.  Procedures Procedures (including critical care time)  Medications Ordered in UC Medications - No data to display  Initial Impression / Assessment and Plan / UC Course  I have reviewed the triage vital signs and the nursing notes.  Pertinent labs & imaging results that were available during my care of the patient were reviewed by me and considered in my medical decision making (see chart for details).     MDM  Pt given phenergan for nausea.  Pt advised to take tylenol for pain   Final Clinical Impressions(s) / UC Diagnoses   Final diagnoses:  Minor head injury, initial encounter   Discharge Instructions   None    ED Prescriptions    Medication Sig Dispense Auth. Provider   promethazine (PHENERGAN) 25 MG tablet Take 1 tablet (25 mg total) by mouth every 6 (six) hours as needed for nausea or vomiting. 20 tablet Fransico Meadow, [place]     Controlled Substance Prescriptions [place] Controlled Substance Registry consulted? Not Applicable   Fransico Meadow, [place] 01/29/19 2032

## 2019-01-29 NOTE — ED Triage Notes (Signed)
Patient presents to Urgent Care with complaints of falling 4 days ago, hitting her head, buttocks, and right elbow. Patient states she was seen here at the [facility] but her head was not hurting like it is today, wants to make sure "nothing is wrong".

## 2019-02-04 ENCOUNTER — Other Ambulatory Visit: Payer: Self-pay

## 2019-02-04 ENCOUNTER — Ambulatory Visit (HOSPITAL_COMMUNITY)
Admission: EM | Admit: 2019-02-04 | Discharge: 2019-02-04 | Disposition: A | Discharge status: Home / Self Care | Payer: Medicaid Other | Attending: Internal Medicine | Admitting: Internal Medicine

## 2019-02-04 ENCOUNTER — Encounter (HOSPITAL_COMMUNITY): Payer: Self-pay

## 2019-02-04 DIAGNOSIS — R0982 Postnasal drip: Secondary | ICD-10-CM

## 2019-02-04 DIAGNOSIS — R519 Headache, unspecified: Secondary | ICD-10-CM

## 2019-02-04 DIAGNOSIS — R51 Headache: Secondary | ICD-10-CM

## 2019-02-04 MED ORDER — PROMETHAZINE-DM 6.25-15 MG/5ML PO SYRP: 5.0000 mL | ORAL_SOLUTION | Freq: Every day | ORAL | 0 refills | Status: DC

## 2019-02-04 MED ORDER — DEXTROMETHORPHAN HBR 10 MG/5ML PO SYRP: ORAL_SOLUTION | ORAL | 0 refills | Status: DC

## 2019-02-04 NOTE — Discharge Instructions (Addendum)
YOU DO NOT HAVE A BACTERIAL SINUS INFECTION, YOU HAVE SINUS INFLAMMATION.  DO SALINE RINSES WITH BOILED WATER THAT HAS BEEN COOLED DOWN OR DISTILLED WATER TWICE A DAY, AM AND AROUND 5 PM, NOT BED TIME BECAUSE IT WILL MAKE YOU COUGH DUE TO SALINE REMAINING IN YOUR SINUSES. DO THIS FOR 4-7 DAYS.  AVOID EATING BEFORE BED TIME, SINCE YOUR CAN WAKE UP WITH NAUSEA AND INCREASED MUCOUS FROM NIGHT TIME REFLUX

## 2019-02-04 NOTE — ED Triage Notes (Signed)
Patient presents to Urgent Care with complaints of productive cough and nasal congestion since about 5 days ago, sputum is yellow. Patient states she feels worse in the mornings.

## 2019-02-04 NOTE — ED Provider Notes (Signed)
[place]    CSN: [id number] Arrival date & time: 02/04/19  1735     History   Chief Complaint Chief Complaint  Patient presents with  . Recurrent Sinusitis    HPI Penny [name] is a 38 y.o. female.   Onset of vomiting 5 days ago and had been sneezing off and on and saw mucous in her vomit. Has cough at night time when she lays down.  She denies SOB, but gets intermittent coughing fits and then is when she feels  Yesterday when she vomited she saw blood in the mucous. Had ear pain for a couple of days this week, but resolved.  The cough is bothering her and keeping her up at night. Has not been around any one who has been sick. Vomiting has only been having in the am. Both kids at home had influenza early this month. She denies fever and body aches.      Past Medical History:  Diagnosis Date  . ADHD (attention deficit hyperactivity disorder)   . Anxiety    no meds  . Chronic back pain   . Depression    no meds  . Essential hypertension    a. Dx 06/2016.  . Migraines    last one over 1 month ago  . Overweight   . Palpitations - Sinus Tachycardia    a. 12/2016 Echo: E f55-60%, no rwma, nl LA/RA sizes.  . Placenta previa    w/ 2 pregnancies.  . Preterm labor    c/s at 36 wks  . Scoliosis   . Sinus tachycardia     Patient Active Problem List   Diagnosis Date Noted  . Uterine fibroid 01/23/2016  . Right groin pain 01/23/2016  . History of C-section 01/23/2016  . Migraines   . Anxiety   . Depression     Past Surgical History:  Procedure Laterality Date  . CESAREAN SECTION  2010   x 1 at 36 wks in [place]  . CESAREAN SECTION  February 13, 2012  . CHROMOPERTUBATION  10/26/2015   Procedure: ATTEMPTED CHROMOPERTUBATION;  Surgeon: Crawford Givens, MD;  Location: [facility];  Service: Gynecology;;  . LAPAROSCOPIC LYSIS OF ADHESIONS  10/26/2015   Procedure: LAPAROSCOPIC LYSIS OF ADHESIONS;  Surgeon: Crawford Givens, MD;  Location: [facility];  Service:  Gynecology;;  . LAPAROSCOPY N/A 10/26/2015   Procedure: LAPAROSCOPY OPERATIVE WITH REMOVAL OF MISPLACED FILSHE CLIP;  Surgeon: Crawford Givens, MD;  Location: [facility];  Service: Gynecology;  Laterality: N/A;  . SVD  2003   x 1 in [place]  . TUBAL LIGATION    . WISDOM TOOTH EXTRACTION      OB History    Gravida  3   Para  3   Term  1   Preterm  2   AB  0   Living  3     SAB  0   TAB  0   Ectopic  0   Multiple  0   Live Births  3            Home Medications    Prior to Admission medications   Medication Sig Start Date End Date Taking? Authorizing Provider  AIMOVIG 140 MG/ML SOAJ Inject 1,140 mg into the skin every 30 (thirty) days.  05/14/18   [provider]  atomoxetine (STRATTERA) 60 MG capsule Take 60 mg by mouth daily. 08/21/18   [provider]  cyclobenzaprine (FLEXERIL) 10 MG tablet Take 10 mg by mouth  daily as needed for muscle spasms (pain).    [provider]  DULoxetine (CYMBALTA) 30 MG capsule Take 30 mg by mouth daily. Taking with 60 mg daily to equal 90 mg    [provider]  DULoxetine (CYMBALTA) 60 MG capsule Take 60 mg by mouth daily. takin g with 30 mg to equal 90 mg 06/08/17   [provider]  gabapentin (NEURONTIN) 100 MG capsule Take 200 mg by mouth 2 (two) times daily. 08/31/18   [provider]  hydrOXYzine (VISTARIL) 50 MG capsule Take 50 mg by mouth every 8 (eight) hours as needed for anxiety. 08/21/18   [provider]  ibuprofen (ADVIL,MOTRIN) 800 MG tablet Take 800 mg by mouth 2 (two) times daily.    [provider]  ibuprofen (ADVIL,MOTRIN) 800 MG tablet Take 1 tablet (800 mg total) by mouth every 8 (eight) hours as needed for moderate pain. 12/16/18   Raylene Everts, MD  LORazepam (ATIVAN) 0.5 MG tablet Take 0.5 mg by mouth 3 (three) times daily as needed for anxiety. 08/21/18   [provider]  metoprolol succinate (TOPROL-XL) 25 MG 24 hr tablet Take 25 mg by  mouth 2 (two) times daily.  03/03/17   [provider]  mirtazapine (REMERON) 30 MG tablet Take 30 mg by mouth at bedtime. 08/21/18   [provider]  ondansetron (ZOFRAN) 4 MG tablet Take 1 tablet (4 mg total) by mouth every 6 (six) hours. 01/25/19   Marney Setting, NP  oxyCODONE-acetaminophen (PERCOCET/ROXICET) 5-325 MG tablet Take 2 tablets by mouth every 4 (four) hours as needed for severe pain. 12/12/18   McDonald, [name], PA-C  promethazine (PHENERGAN) 25 MG tablet Take 1 tablet (25 mg total) by mouth every 6 (six) hours as needed for nausea or vomiting. 01/29/19   Fransico Meadow, PA-C  RYVENT 6 MG TABS Take 6 mg by mouth daily. 10/06/17   [provider]  Topiramate ER (TROKENDI XR) 200 MG CP24 Take 200 mg by mouth at bedtime.    [provider]    Family History Family History  Problem Relation Age of Onset  . Cancer Mother   . CAD Mother   . Hypertension Sister   . Sickle cell trait Daughter   . Asthma Son   . Asthma Paternal Grandmother   . Pseudochol deficiency Neg Hx   . Malignant hyperthermia Neg Hx   . Anesthesia problems Neg Hx   . Hypotension Neg Hx     Social History Social History   Tobacco Use  . Smoking status: Current Every Day Smoker    Packs/day: 0.10    Years: 18.00    Pack years: 1.80    Types: Cigarettes  . Smokeless tobacco: Never Used  Substance Use Topics  . Alcohol use: Yes    Comment: Rare   . Drug use: Not Currently    Types: Marijuana     Allergies   Shrimp [shellfish allergy]   Review of Systems Review of Systems  Constitutional: Negative for appetite change, chills, diaphoresis and fever.  HENT: Positive for postnasal drip and sinus pressure. Negative for ear discharge, ear pain, rhinorrhea and trouble swallowing.   Eyes: Negative for discharge and itching.  Respiratory: Positive for cough. Negative for chest tightness, shortness of breath and wheezing.   Cardiovascular: Negative for chest pain.   Gastrointestinal: Positive for vomiting. Negative for abdominal pain, diarrhea and nausea.       Denies heart burn, but she  does eat before bed time  Genitourinary: Negative for difficulty urinating and dysuria.  Musculoskeletal: Negative for myalgias.  Skin: Negative for rash.  Neurological: Negative for headaches.  Hematological: Negative for adenopathy.   Physical Exam Triage Vital Signs ED Triage Vitals  Enc Vitals Group     BP 02/04/19 1802 122/81     Pulse Rate 02/04/19 1802 94     Resp 02/04/19 1802 16     Temp 02/04/19 1802 98.4 F (36.9 C)     Temp Source 02/04/19 1802 Oral     SpO2 02/04/19 1802 100 %     Weight 02/04/19 1800 159 lb 15.8 oz (72.6 kg)     Height 02/04/19 1800 5\' 4"  (1.626 m)     Head Circumference --      Peak Flow --      Pain Score 02/04/19 1800 0     Pain Loc --      Pain Edu? --      Excl. in [place]? --    No data found.  Updated Vital Signs BP 122/81 (BP Location: Right Arm)   Pulse 94   Temp 98.4 F (36.9 C) (Oral)   Resp 16   Ht 5\' 4"  (1.626 m)   Wt 159 lb 15.8 oz (72.6 kg)   LMP 01/23/2019   SpO2 100%   BMI 27.46 kg/m   Visual Acuity Right Eye Distance:   Left Eye Distance:   Bilateral Distance:    Right Eye Near:   Left Eye Near:    Bilateral Near:     Physical Exam Alert, well kept in NAD EYES- non icterus, conjunctiva's are normal.  ENT- external ears and nose are normal, TM's gray and shiny, Nose with clear mucous and            Mild congestion bilaterally. Throat- clear. Has sinus tenderness all over, but transillumination of them is normal.  NECK- supple with no nodes LUNG- clear, with no wheezing, rhonchi or rales.  HEART- RRR with no murmurs ABD- + BS, soft. No masses tenderness or hepatosplenomegaly.  SKIN- non jaundiced, no rashes or ecchymosis.    UC Treatments / Results  Labs (all labs ordered are listed, but only abnormal results are displayed) Labs Reviewed - No data to display  EKG None  Radiology  No results found.  Procedures Procedures   Medications Ordered in UC Medications - No data to display  Initial Impression / Assessment and Plan / UC Course  I have reviewed the triage vital signs and the nursing notes. I educated her the difference between viral and bacterial sinusitis, and today she only has inflammation of her sinuses, not infection. Advised to do saline rinses bid x 3-5 days. She requested something for cough, so I sent Phenergan with guafenesin, but her pharmacy does not cover this apparently when she called [place] around 7:50 pm, so I sent plain Guafenesin. See instructions.   Final Clinical Impressions(s) / UC Diagnoses   Final diagnoses:  None   Discharge Instructions   None    ED Prescriptions    None     Controlled Substance Prescriptions [place] Controlled Substance Registry consulted?    Shelby Mattocks, Hershal Coria 02/04/19 2008

## 2019-02-05 ENCOUNTER — Telehealth (HOSPITAL_COMMUNITY): Payer: Self-pay | Admitting: Emergency Medicine

## 2019-02-05 NOTE — Telephone Encounter (Signed)
Pt pharmacy called asking to switch meds to promethazine plain due to insurance; per [facility] called in albuterol inhaler 2 puffs q 6 hours prn cough

## 2019-02-11 ENCOUNTER — Ambulatory Visit (HOSPITAL_COMMUNITY)
Admission: EM | Admit: 2019-02-11 | Discharge: 2019-02-11 | Disposition: A | Discharge status: Home / Self Care | Payer: Medicaid Other | Attending: Family Medicine | Admitting: Family Medicine

## 2019-02-11 ENCOUNTER — Other Ambulatory Visit: Payer: Self-pay

## 2019-02-11 ENCOUNTER — Encounter (HOSPITAL_COMMUNITY): Payer: Self-pay | Admitting: Emergency Medicine

## 2019-02-11 DIAGNOSIS — R51 Headache: Secondary | ICD-10-CM | POA: Diagnosis not present

## 2019-02-11 DIAGNOSIS — R519 Headache, unspecified: Secondary | ICD-10-CM

## 2019-02-11 MED ORDER — DEXAMETHASONE SODIUM PHOSPHATE 10 MG/ML IJ SOLN
INTRAMUSCULAR | Status: AC
  Filled 2019-02-11: qty 1

## 2019-02-11 MED ORDER — DEXAMETHASONE SODIUM PHOSPHATE 10 MG/ML IJ SOLN
10.0000 mg | Freq: Once | INTRAMUSCULAR | Status: AC
  Administered 2019-02-11: 10 mg via INTRAMUSCULAR

## 2019-02-11 MED ORDER — IBUPROFEN 600 MG PO TABS: 600.0000 mg | ORAL_TABLET | Freq: Three times a day (TID) | ORAL | 0 refills | Status: DC | PRN

## 2019-02-11 MED ORDER — CETIRIZINE HCL 10 MG PO CAPS: 10.0000 mg | ORAL_CAPSULE | Freq: Every day | ORAL | 0 refills | Status: DC

## 2019-02-11 NOTE — ED Provider Notes (Signed)
[facility]    CSN: [id number] Arrival date & time: 02/11/19  1250     History   Chief Complaint Chief Complaint  Patient presents with  . Headache    HPI Penny [name] is a 38 y.o. female history of hypertension presenting today for evaluation of headache and URI symptoms.  Patient states that last week she had sinusitis and the symptoms develop to resolved.  She has had a mild discomfort to the crown of her head.  Notices increased pain with pressure or when lying flat.  She has not taken anything for this.  She denies any new vision changes, but notes that in general she has had issues with seeing.  Notes that her glasses broke and is waiting on a new prescription of glasses to come.  Denies chest pain or shortness of breath.  Denies any fevers.  Denies any weakness or difficulty speaking.  Denies neck pain or stiffness.  Denies any recent travel, denies any known exposure to COVID-19.   Currently living in a shelter.   HPI  Past Medical History:  Diagnosis Date  . ADHD (attention deficit hyperactivity disorder)   . Anxiety    no meds  . Chronic back pain   . Depression    no meds  . Essential hypertension    a. Dx 06/2016.  . Migraines    last one over 1 month ago  . Overweight   . Palpitations - Sinus Tachycardia    a. 12/2016 Echo: E f55-60%, no rwma, nl LA/RA sizes.  . Placenta previa    w/ 2 pregnancies.  . Preterm labor    c/s at 36 wks  . Scoliosis   . Sinus tachycardia     Patient Active Problem List   Diagnosis Date Noted  . Uterine fibroid 01/23/2016  . Right groin pain 01/23/2016  . History of C-section 01/23/2016  . Migraines   . Anxiety   . Depression     Past Surgical History:  Procedure Laterality Date  . CESAREAN SECTION  2010   x 1 at 36 wks in [place]  . CESAREAN SECTION  February 13, 2012  . CHROMOPERTUBATION  10/26/2015   Procedure: ATTEMPTED CHROMOPERTUBATION;  Surgeon: Crawford Givens, MD;  Location: [facility];  Service:  Gynecology;;  . LAPAROSCOPIC LYSIS OF ADHESIONS  10/26/2015   Procedure: LAPAROSCOPIC LYSIS OF ADHESIONS;  Surgeon: Crawford Givens, MD;  Location: [facility];  Service: Gynecology;;  . LAPAROSCOPY N/A 10/26/2015   Procedure: LAPAROSCOPY OPERATIVE WITH REMOVAL OF MISPLACED FILSHE CLIP;  Surgeon: Crawford Givens, MD;  Location: [facility];  Service: Gynecology;  Laterality: N/A;  . SVD  2003   x 1 in [place]  . TUBAL LIGATION    . WISDOM TOOTH EXTRACTION      OB History    Gravida  3   Para  3   Term  1   Preterm  2   AB  0   Living  3     SAB  0   TAB  0   Ectopic  0   Multiple  0   Live Births  3            Home Medications    Prior to Admission medications   Medication Sig Start Date End Date Taking? Authorizing Provider  AIMOVIG 140 MG/ML SOAJ Inject 1,140 mg into the skin every 30 (thirty) days.  05/14/18   [provider]  atomoxetine (STRATTERA) 60 MG capsule Take 60  mg by mouth daily. 08/21/18   [provider]  Cetirizine HCl 10 MG CAPS Take 1 capsule (10 mg total) by mouth daily for 10 days. 02/11/19 02/21/19  Refugia Laneve C, PA-C  cyclobenzaprine (FLEXERIL) 10 MG tablet Take 10 mg by mouth daily as needed for muscle spasms (pain).    [provider]  Dextromethorphan HBr 10 MG/5ML SYRP 10 ml qhs prn cough at night time. 02/04/19   Rodriguez-Southworth, Sunday Spillers, PA-C  DULoxetine (CYMBALTA) 30 MG capsule Take 30 mg by mouth daily. Taking with 60 mg daily to equal 90 mg    [provider]  DULoxetine (CYMBALTA) 60 MG capsule Take 60 mg by mouth daily. takin g with 30 mg to equal 90 mg 06/08/17   [provider]  gabapentin (NEURONTIN) 100 MG capsule Take 200 mg by mouth 2 (two) times daily. 08/31/18   [provider]  hydrOXYzine (VISTARIL) 50 MG capsule Take 50 mg by mouth every 8 (eight) hours as needed for anxiety. 08/21/18   [provider]  ibuprofen (ADVIL,MOTRIN) 600 MG tablet Take 1 tablet (600 mg total) by  mouth every 8 (eight) hours as needed. 02/11/19   Skye Rodarte C, PA-C  LORazepam (ATIVAN) 0.5 MG tablet Take 0.5 mg by mouth 3 (three) times daily as needed for anxiety. 08/21/18   [provider]  metoprolol succinate (TOPROL-XL) 25 MG 24 hr tablet Take 25 mg by mouth 2 (two) times daily.  03/03/17   [provider]  mirtazapine (REMERON) 30 MG tablet Take 30 mg by mouth at bedtime. 08/21/18   [provider]  ondansetron (ZOFRAN) 4 MG tablet Take 1 tablet (4 mg total) by mouth every 6 (six) hours. 01/25/19   Marney Setting, NP  oxyCODONE-acetaminophen (PERCOCET/ROXICET) 5-325 MG tablet Take 2 tablets by mouth every 4 (four) hours as needed for severe pain. 12/12/18   McDonald, [name], PA-C  promethazine (PHENERGAN) 25 MG tablet Take 1 tablet (25 mg total) by mouth every 6 (six) hours as needed for nausea or vomiting. 01/29/19   Fransico Meadow, PA-C  promethazine-dextromethorphan (PROMETHAZINE-DM) 6.25-15 MG/5ML syrup Take 5 mLs by mouth at bedtime. PRN COUGH 02/04/19   Rodriguez-Southworth, Sunday Spillers, PA-C  RYVENT 6 MG TABS Take 6 mg by mouth daily. 10/06/17   [provider]  Topiramate ER (TROKENDI XR) 200 MG CP24 Take 200 mg by mouth at bedtime.    [provider]    Family History Family History  Problem Relation Age of Onset  . Cancer Mother   . CAD Mother   . Hypertension Sister   . Sickle cell trait Daughter   . Asthma Son   . Asthma Paternal Grandmother   . Pseudochol deficiency Neg Hx   . Malignant hyperthermia Neg Hx   . Anesthesia problems Neg Hx   . Hypotension Neg Hx     Social History Social History   Tobacco Use  . Smoking status: Current Every Day Smoker    Packs/day: 0.10    Years: 18.00    Pack years: 1.80    Types: Cigarettes  . Smokeless tobacco: Never Used  Substance Use Topics  . Alcohol use: Yes    Comment: Rare   . Drug use: Not Currently    Types: Marijuana     Allergies   Shrimp [shellfish allergy]    Review of Systems Review of Systems  Constitutional: Negative for activity change, appetite change, chills, fatigue and fever.  HENT: Positive for congestion. Negative  for ear pain, rhinorrhea, sinus pressure, sore throat and trouble swallowing.   Eyes: Negative for discharge and redness.  Respiratory: Negative for cough, chest tightness and shortness of breath.   Cardiovascular: Negative for chest pain.  Gastrointestinal: Negative for abdominal pain, diarrhea, nausea and vomiting.  Musculoskeletal: Negative for myalgias.  Skin: Negative for rash.  Neurological: Positive for headaches. Negative for dizziness and light-headedness.     Physical Exam Triage Vital Signs ED Triage Vitals [02/11/19 1320]  Enc Vitals Group     BP      Pulse Rate 78     Resp 18     Temp (!) 97.3 F (36.3 C)     Temp Source Temporal     SpO2 100 %     Weight      Height      Head Circumference      Peak Flow      Pain Score 5     Pain Loc      Pain Edu?      Excl. in [place]?    No data found.  Updated Vital Signs Pulse 78   Temp (!) 97.3 F (36.3 C) (Temporal)   Resp 18   LMP 01/23/2019   SpO2 100%   Visual Acuity Right Eye Distance:   Left Eye Distance:   Bilateral Distance:    Right Eye Near:   Left Eye Near:    Bilateral Near:     Physical Exam Vitals signs and nursing note reviewed.  Constitutional:      General: She is not in acute distress.    Appearance: She is well-developed.  HENT:     Head: Normocephalic and atraumatic.     Ears:     Comments: Bilateral ears without tenderness to palpation of external auricle, tragus and mastoid, EAC's without erythema or swelling, TM's with good bony landmarks and cone of light. Non erythematous.     Mouth/Throat:     Comments: Oral mucosa pink and moist, no tonsillar enlargement or exudate. Posterior pharynx patent and nonerythematous, no uvula deviation or swelling. Normal phonation.  Eyes:     Extraocular Movements: Extraocular  movements intact.     Conjunctiva/sclera: Conjunctivae normal.     Pupils: Pupils are equal, round, and reactive to light.  Neck:     Musculoskeletal: Neck supple.  Cardiovascular:     Rate and Rhythm: Normal rate and regular rhythm.     Heart sounds: No murmur.  Pulmonary:     Effort: Pulmonary effort is normal. No respiratory distress.     Breath sounds: Normal breath sounds.     Comments: Breathing comfortably at rest, CTABL, no wheezing, rales or other adventitious sounds auscultated Abdominal:     Palpations: Abdomen is soft.     Tenderness: There is no abdominal tenderness.  Skin:    General: Skin is warm and dry.  Neurological:     General: No focal deficit present.     Mental Status: She is alert and oriented to person, place, and time. Mental status is at baseline.     Cranial Nerves: No cranial nerve deficit.     Motor: No weakness.      UC Treatments / Results  Labs (all labs ordered are listed, but only abnormal results are displayed) Labs Reviewed - No data to display  EKG None  Radiology No results found.  Procedures Procedures (including critical care time)  Medications Ordered in UC Medications  dexamethasone (DECADRON) injection 10 mg (10  mg Intramuscular Given 02/11/19 1409)    Initial Impression / Assessment and Plan / UC Course  I have reviewed the triage vital signs and the nursing notes.  Pertinent labs & imaging results that were available during my care of the patient were reviewed by me and considered in my medical decision making (see chart for details).    Patient with headache, no neuro deficits, likely tension.  Requesting something prior to discharge, offered Toradol, patient states Toradol has not worked for the past.  Offered Decadron as alternative.  Discussed risks and benefits currently with coronavirus with steroids.  Patient opted to proceed with Decadron injection.  Will recommend continuing to take Tylenol and ibuprofen.   Continue daily allergy medicine.  Continue to monitor,Discussed strict return precautions. Patient verbalized understanding and is agreeable with plan.  Final Clinical Impressions(s) / UC Diagnoses   Final diagnoses:  Acute nonintractable headache, unspecified headache type     Discharge Instructions     Please take daily cetirizine for allergies Use anti-inflammatories for pain/swelling. You may take up to 800 mg Ibuprofen every 8 hours with food. You may supplement Ibuprofen with Tylenol [phone] mg every 8 hours.  Follow up if symptoms worsening or not resolving   ED Prescriptions    Medication Sig Dispense Auth. Provider   Cetirizine HCl 10 MG CAPS Take 1 capsule (10 mg total) by mouth daily for 10 days. 10 capsule Thoms Barthelemy C, PA-C   ibuprofen (ADVIL,MOTRIN) 600 MG tablet Take 1 tablet (600 mg total) by mouth every 8 (eight) hours as needed. 30 tablet Harveen Flesch, Anna C, PA-C     Controlled Substance Prescriptions [place] Controlled Substance Registry consulted? Not Applicable   Janith Lima, [place] 02/11/19 1423

## 2019-02-11 NOTE — Discharge Instructions (Addendum)
Please take daily cetirizine for allergies Use anti-inflammatories for pain/swelling. You may take up to 800 mg Ibuprofen every 8 hours with food. You may supplement Ibuprofen with Tylenol [phone] mg every 8 hours.  Follow up if symptoms worsening or not resolving

## 2019-02-11 NOTE — ED Triage Notes (Signed)
Pt sts head pain after falling 2 weeks ago; pt was seen after fall; denies other sx

## 2019-03-06 ENCOUNTER — Other Ambulatory Visit: Payer: Self-pay

## 2019-03-06 ENCOUNTER — Emergency Department (HOSPITAL_COMMUNITY): Payer: Medicaid Other

## 2019-03-06 ENCOUNTER — Emergency Department (HOSPITAL_COMMUNITY)
Admission: EM | Admit: 2019-03-06 | Discharge: 2019-03-06 | Disposition: A | Discharge status: Home / Self Care | Payer: Medicaid Other | Attending: Emergency Medicine | Admitting: Emergency Medicine

## 2019-03-06 ENCOUNTER — Encounter (HOSPITAL_COMMUNITY): Payer: Self-pay

## 2019-03-06 DIAGNOSIS — F17210 Nicotine dependence, cigarettes, uncomplicated: Secondary | ICD-10-CM | POA: Diagnosis not present

## 2019-03-06 DIAGNOSIS — R51 Headache: Secondary | ICD-10-CM | POA: Diagnosis present

## 2019-03-06 DIAGNOSIS — Z79899 Other long term (current) drug therapy: Secondary | ICD-10-CM | POA: Insufficient documentation

## 2019-03-06 DIAGNOSIS — G43709 Chronic migraine without aura, not intractable, without status migrainosus: Secondary | ICD-10-CM | POA: Diagnosis not present

## 2019-03-06 DIAGNOSIS — I10 Essential (primary) hypertension: Secondary | ICD-10-CM | POA: Insufficient documentation

## 2019-03-06 DIAGNOSIS — G43009 Migraine without aura, not intractable, without status migrainosus: Secondary | ICD-10-CM

## 2019-03-06 LAB — BASIC METABOLIC PANEL
Anion gap: 7 (ref 5–15)
BUN: 11 mg/dL (ref 6–20)
CO2: 24 mmol/L (ref 22–32)
Calcium: 8.9 mg/dL (ref 8.9–10.3)
Chloride: 104 mmol/L (ref 98–111)
Creatinine, Ser: 0.57 mg/dL (ref 0.44–1.00)
GFR calc Af Amer: >60 mL/min (ref >60)
GFR calc non Af Amer: >60 mL/min (ref >60)
Glucose, Bld: 91 mg/dL (ref 70–99)
Potassium: 3.2 mmol/L — ABNORMAL LOW (ref 3.5–5.1)
Sodium: 135 mmol/L (ref 135–145)

## 2019-03-06 LAB — CBC
HCT: 39.6 % (ref 36.0–46.0)
Hemoglobin: 12.5 g/dL (ref 12.0–15.0)
MCH: 26.3 pg (ref 26.0–34.0)
MCHC: 31.6 g/dL (ref 30.0–36.0)
MCV: 83.2 fL (ref 80.0–100.0)
Platelets: 403 10*3/uL — ABNORMAL HIGH (ref 150–400)
RBC: 4.76 MIL/uL (ref 3.87–5.11)
RDW: 15.6 % — ABNORMAL HIGH (ref 11.5–15.5)
WBC: 12.5 10*3/uL — ABNORMAL HIGH (ref 4.0–10.5)
nRBC: 0 % (ref 0.0–0.2)

## 2019-03-06 LAB — I-STAT BETA HCG BLOOD, ED (MC, WL, AP ONLY): I-stat hCG, quantitative: <5 m[IU]/mL (ref <5)

## 2019-03-06 MED ORDER — SODIUM CHLORIDE (PF) 0.9 % IJ SOLN
INTRAMUSCULAR | Status: AC
  Filled 2019-03-06: qty 50

## 2019-03-06 MED ORDER — METOCLOPRAMIDE HCL 5 MG/ML IJ SOLN
10.0000 mg | Freq: Once | INTRAMUSCULAR | Status: AC
  Administered 2019-03-06: 10 mg via INTRAVENOUS
  Filled 2019-03-06: qty 2

## 2019-03-06 MED ORDER — KETOROLAC TROMETHAMINE 30 MG/ML IJ SOLN
30.0000 mg | Freq: Once | INTRAMUSCULAR | Status: AC
  Administered 2019-03-06: 30 mg via INTRAVENOUS
  Filled 2019-03-06: qty 1

## 2019-03-06 MED ORDER — DIPHENHYDRAMINE HCL 50 MG/ML IJ SOLN
25.0000 mg | Freq: Once | INTRAMUSCULAR | Status: AC
  Administered 2019-03-06: 25 mg via INTRAVENOUS
  Filled 2019-03-06: qty 1

## 2019-03-06 MED ORDER — SODIUM CHLORIDE 0.9 % IV BOLUS (SEPSIS)
1000.0000 mL | Freq: Once | INTRAVENOUS | Status: AC
  Administered 2019-03-06: 1000 mL via INTRAVENOUS

## 2019-03-06 NOTE — ED Notes (Signed)
Bed: FX58 Expected date:  Expected time:  Means of arrival:  Comments: 76F migraine

## 2019-03-06 NOTE — ED Triage Notes (Signed)
Per EMS, patient coming from home with complaints of a headache since having an adjustment at her chiropractors office around 1330. Denies visual changes but does complain of nausea. Patient does have a history of migraines.

## 2019-03-06 NOTE — ED Provider Notes (Signed)
TIME SEEN: 12:28 AM  CHIEF COMPLAINT: Headache, neck pain  HPI: Patient is a 38 year old female with history of migraines who presents the emergency department with frontal throbbing headache that radiates into the back of her head and neck that started today after she had an adjustment with her chiropractor.  She has a history of migraines and states this feels somewhat similar but also feels different.  She complains that her neck is uncomfortable and "sore".  She denies numbness, tingling or focal weakness.  She does have photophobia but no nausea or vomiting.  States that her chiropractor did perform adjustments to her neck today.  Patient arrives today via EMS.  ROS: See HPI Constitutional: no fever  Eyes: no drainage  ENT: no runny nose   Cardiovascular:  no chest pain  Resp: no SOB  GI: no vomiting GU: no dysuria Integumentary: no rash  Allergy: no hives  Musculoskeletal: no leg swelling  Neurological: no slurred speech ROS otherwise negative  PAST MEDICAL HISTORY/PAST SURGICAL HISTORY:  Past Medical History:  Diagnosis Date  . ADHD (attention deficit hyperactivity disorder)   . Anxiety    no meds  . Chronic back pain   . Depression    no meds  . Essential hypertension    a. Dx 06/2016.  . Migraines    last one over 1 month ago  . Overweight   . Palpitations - Sinus Tachycardia    a. 12/2016 Echo: E f55-60%, no rwma, nl LA/RA sizes.  . Placenta previa    w/ 2 pregnancies.  . Preterm labor    c/s at 36 wks  . Scoliosis   . Sinus tachycardia     MEDICATIONS:  Prior to Admission medications   Medication Sig Start Date End Date Taking? Authorizing Provider  AIMOVIG 140 MG/ML SOAJ Inject 1,140 mg into the skin every 30 (thirty) days.  05/14/18   [provider]  atomoxetine (STRATTERA) 60 MG capsule Take 60 mg by mouth daily. 08/21/18   [provider]  Cetirizine HCl 10 MG CAPS Take 1 capsule (10 mg total) by mouth daily for 10 days. 02/11/19 02/21/19   Wieters, Hallie C, PA-C  cyclobenzaprine (FLEXERIL) 10 MG tablet Take 10 mg by mouth daily as needed for muscle spasms (pain).    [provider]  Dextromethorphan HBr 10 MG/5ML SYRP 10 ml qhs prn cough at night time. 02/04/19   Rodriguez-Southworth, Sunday Spillers, PA-C  DULoxetine (CYMBALTA) 30 MG capsule Take 30 mg by mouth daily. Taking with 60 mg daily to equal 90 mg    [provider]  DULoxetine (CYMBALTA) 60 MG capsule Take 60 mg by mouth daily. takin g with 30 mg to equal 90 mg 06/08/17   [provider]  gabapentin (NEURONTIN) 100 MG capsule Take 200 mg by mouth 2 (two) times daily. 08/31/18   [provider]  hydrOXYzine (VISTARIL) 50 MG capsule Take 50 mg by mouth every 8 (eight) hours as needed for anxiety. 08/21/18   [provider]  ibuprofen (ADVIL,MOTRIN) 600 MG tablet Take 1 tablet (600 mg total) by mouth every 8 (eight) hours as needed. 02/11/19   Wieters, Hallie C, PA-C  LORazepam (ATIVAN) 0.5 MG tablet Take 0.5 mg by mouth 3 (three) times daily as needed for anxiety. 08/21/18   [provider]  metoprolol succinate (TOPROL-XL) 25 MG 24 hr tablet Take 25 mg by mouth 2 (two) times daily.  03/03/17   [provider]  mirtazapine (REMERON) 30 MG tablet Take  30 mg by mouth at bedtime. 08/21/18   [provider]  ondansetron (ZOFRAN) 4 MG tablet Take 1 tablet (4 mg total) by mouth every 6 (six) hours. 01/25/19   Marney Setting, NP  oxyCODONE-acetaminophen (PERCOCET/ROXICET) 5-325 MG tablet Take 2 tablets by mouth every 4 (four) hours as needed for severe pain. 12/12/18   McDonald, [name], PA-C  promethazine (PHENERGAN) 25 MG tablet Take 1 tablet (25 mg total) by mouth every 6 (six) hours as needed for nausea or vomiting. 01/29/19   Fransico Meadow, PA-C  promethazine-dextromethorphan (PROMETHAZINE-DM) 6.25-15 MG/5ML syrup Take 5 mLs by mouth at bedtime. PRN COUGH 02/04/19   Rodriguez-Southworth, Sunday Spillers, PA-C  RYVENT 6 MG TABS Take  6 mg by mouth daily. 10/06/17   [provider]  Topiramate ER (TROKENDI XR) 200 MG CP24 Take 200 mg by mouth at bedtime.    [provider]    ALLERGIES:  Allergies  Allergen Reactions  . Shrimp [Shellfish Allergy] Itching and Swelling    Itching and swelling in mouth and face    SOCIAL HISTORY:  Social History   Tobacco Use  . Smoking status: Current Every Day Smoker    Packs/day: 0.10    Years: 18.00    Pack years: 1.80    Types: Cigarettes  . Smokeless tobacco: Never Used  Substance Use Topics  . Alcohol use: Yes    Comment: Rare     FAMILY HISTORY: Family History  Problem Relation Age of Onset  . Cancer Mother   . CAD Mother   . Hypertension Sister   . Sickle cell trait Daughter   . Asthma Son   . Asthma Paternal Grandmother   . Pseudochol deficiency Neg Hx   . Malignant hyperthermia Neg Hx   . Anesthesia problems Neg Hx   . Hypotension Neg Hx     EXAM: BP 133/90 (BP Location: Left Arm)   Pulse 79   Temp 99 F (37.2 C) (Oral)   Resp 18   Ht 5\' 4"  (1.626 m)   Wt 88.5 kg   SpO2 99%   BMI 33.47 kg/m  CONSTITUTIONAL: Alert and oriented and responds appropriately to questions.  Appears uncomfortable but not in distress, afebrile HEAD: Normocephalic EYES: Conjunctivae clear, pupils appear equal, EOMI ENT: normal nose; moist mucous membranes NECK: Supple, no meningismus, no nuchal rigidity, no LAD, tender to palpation over the paraspinal muscles of the posterior neck but no midline spinal tenderness or step-off or deformity CARD: RRR; S1 and S2 appreciated; no murmurs, no clicks, no rubs, no gallops RESP: Normal chest excursion without splinting or tachypnea; breath sounds clear and equal bilaterally; no wheezes, no rhonchi, no rales, no hypoxia or respiratory distress, speaking full sentences ABD/GI: Normal bowel sounds; non-distended; soft, non-tender, no rebound, no guarding, no peritoneal signs, no hepatosplenomegaly BACK:  The back  appears normal and is non-tender to palpation, there is no CVA tenderness EXT: Normal ROM in all joints; non-tender to palpation; no edema; normal capillary refill; no cyanosis, no calf tenderness or swelling    SKIN: Normal color for age and race; warm; no rash NEURO: Moves all extremities equally, normal sensation diffusely, cranial nerves II to XII intact, normal speech, patient has diminished strength in bilateral upper and lower extremities likely secondary to poor effort but no asymmetry noted PSYCH: The patient's mood and manner are appropriate. Grooming and personal hygiene are appropriate.  MEDICAL DECISION MAKING: Patient here with complaints of headache and neck pain.  I suspect  most of this is from her migraine headache but given she had adjustment with a chiropractor today, will obtain CTA head and neck to evaluate her vessels to ensure no sign of dissection.  She has no obvious focal neurologic deficits on exam.  Low suspicion for stroke or intracranial hemorrhage.  Will treat with IV Toradol, Reglan, Benadryl and IV fluids.  Low suspicion for meningitis, encephalitis.  ED PROGRESS: 2:10 AM  Pt reports her pain is completely resolved.  Her labs are unremarkable.  She has not yet had her CT scan and states she does not feel that she needs them.  She thinks this is her typical migraine and now that she feels better, she is ready to go home.  I have low suspicion now for arterial injury but we have discussed risk and benefits of imaging.  She states that this time she would prefer to hold off on imaging and we have discussed at length return precautions.  Patient comfortable with this plan.  She is still neurologically intact here and completely pain-free.    At this time, I do not feel there is any life-threatening condition present. I have reviewed and discussed all results (EKG, imaging, lab, urine as appropriate) and exam findings with patient/family. I have reviewed nursing notes and  appropriate previous records.  I feel the patient is safe to be discharged home without further emergent workup and can continue workup as an outpatient as needed. Discussed usual and customary return precautions. Patient/family verbalize understanding and are comfortable with this plan.  Outpatient follow-up has been provided as needed. All questions have been answered.      Milady Fleener, Delice Bison, DO 03/06/19 0214

## 2019-05-30 ENCOUNTER — Encounter (HOSPITAL_COMMUNITY): Payer: Self-pay

## 2019-05-30 ENCOUNTER — Other Ambulatory Visit: Payer: Self-pay

## 2019-05-30 ENCOUNTER — Ambulatory Visit (HOSPITAL_COMMUNITY)
Admission: EM | Admit: 2019-05-30 | Discharge: 2019-05-30 | Disposition: A | Discharge status: Home / Self Care | Payer: Medicaid Other | Attending: Emergency Medicine | Admitting: Emergency Medicine

## 2019-05-30 DIAGNOSIS — N939 Abnormal uterine and vaginal bleeding, unspecified: Secondary | ICD-10-CM | POA: Diagnosis not present

## 2019-05-30 LAB — POCT PREGNANCY, URINE: Preg Test, Ur: NEGATIVE

## 2019-05-30 NOTE — ED Provider Notes (Signed)
[place]    CSN: [id number] Arrival date & time: 05/30/19  1714     History   Chief Complaint Chief Complaint  Patient presents with  . Abnormal Vaginal Bleeding    HPI Penny [name] is a 38 y.o. female.   Penny [name] presents with complaints of vaginal bleeding. Period started 7/2-7/9, last week. For the past two days has had spotting. No heavy bleeding. Bright red and brown. Has had to use pad/tampon. This is abnormal for her. Pelvic pain which is not new for her. Has followed with her gynecologist about this. States she is supposed to be going to [facility] for pelvic pain work up/treatment but has not yet.  Denies any other vaginal discharge itching or rash. Regular period last month. Sexually active. Her tubes were clamped, but one fell off, had surgery to remove the clamp. There was a dye ran through her tube which showed it was "closed."  Some nausea and vomiting. Vomited last night. Has a lot of mucus drainage, she is on antibiotics for sinus infection. She feels her drainage is source of nausea. No other urinary symptoms. Bleeding hasn't gotten any heavier. She has had the same partner. Denies any concerns for stds exposure. States she has an appointment with her gynecologist next week. Hx of adhd, anxiety, back pain, depression, htn, migraines.    ROS per HPI, negative if not otherwise mentioned.      Past Medical History:  Diagnosis Date  . ADHD (attention deficit hyperactivity disorder)   . Anxiety    no meds  . Chronic back pain   . Depression    no meds  . Essential hypertension    a. Dx 06/2016.  . Migraines    last one over 1 month ago  . Overweight   . Palpitations - Sinus Tachycardia    a. 12/2016 Echo: E f55-60%, no rwma, nl LA/RA sizes.  . Placenta previa    w/ 2 pregnancies.  . Preterm labor    c/s at 36 wks  . Scoliosis   . Sinus tachycardia     Patient Active Problem List   Diagnosis Date Noted  . Uterine fibroid 01/23/2016   . Right groin pain 01/23/2016  . History of C-section 01/23/2016  . Migraines   . Anxiety   . Depression     Past Surgical History:  Procedure Laterality Date  . CESAREAN SECTION  2010   x 1 at 36 wks in [place]  . CESAREAN SECTION  February 13, 2012  . CHROMOPERTUBATION  10/26/2015   Procedure: ATTEMPTED CHROMOPERTUBATION;  Surgeon: Crawford Givens, MD;  Location: [facility];  Service: Gynecology;;  . LAPAROSCOPIC LYSIS OF ADHESIONS  10/26/2015   Procedure: LAPAROSCOPIC LYSIS OF ADHESIONS;  Surgeon: Crawford Givens, MD;  Location: [facility];  Service: Gynecology;;  . LAPAROSCOPY N/A 10/26/2015   Procedure: LAPAROSCOPY OPERATIVE WITH REMOVAL OF MISPLACED FILSHE CLIP;  Surgeon: Crawford Givens, MD;  Location: [facility];  Service: Gynecology;  Laterality: N/A;  . SVD  2003   x 1 in [place]  . TUBAL LIGATION    . WISDOM TOOTH EXTRACTION      OB History    Gravida  3   Para  3   Term  1   Preterm  2   AB  0   Living  3     SAB  0   TAB  0   Ectopic  0   Multiple  0   Live Births  3            Home Medications    Prior to Admission medications   Medication Sig Start Date End Date Taking? Authorizing Provider  AIMOVIG 140 MG/ML SOAJ Inject 1,140 mg into the skin every 30 (thirty) days.  05/14/18   [provider]  atomoxetine (STRATTERA) 60 MG capsule Take 60 mg by mouth daily. 08/21/18   [provider]  Cetirizine HCl 10 MG CAPS Take 1 capsule (10 mg total) by mouth daily for 10 days. 02/11/19 02/21/19  Wieters, Hallie C, PA-C  cyclobenzaprine (FLEXERIL) 10 MG tablet Take 10 mg by mouth daily as needed for muscle spasms (pain).    [provider]  Dextromethorphan HBr 10 MG/5ML SYRP 10 ml qhs prn cough at night time. 02/04/19   Rodriguez-Southworth, Sunday Spillers, PA-C  DULoxetine (CYMBALTA) 30 MG capsule Take 30 mg by mouth daily. Taking with 60 mg daily to equal 90 mg    [provider]  DULoxetine (CYMBALTA) 60 MG capsule Take 60 mg by mouth daily.  takin g with 30 mg to equal 90 mg 06/08/17   [provider]  gabapentin (NEURONTIN) 100 MG capsule Take 200 mg by mouth 2 (two) times daily. 08/31/18   [provider]  hydrOXYzine (VISTARIL) 50 MG capsule Take 50 mg by mouth every 8 (eight) hours as needed for anxiety. 08/21/18   [provider]  ibuprofen (ADVIL,MOTRIN) 600 MG tablet Take 1 tablet (600 mg total) by mouth every 8 (eight) hours as needed. 02/11/19   Wieters, Hallie C, PA-C  LORazepam (ATIVAN) 0.5 MG tablet Take 0.5 mg by mouth 3 (three) times daily as needed for anxiety. 08/21/18   [provider]  metoprolol succinate (TOPROL-XL) 25 MG 24 hr tablet Take 25 mg by mouth 2 (two) times daily.  03/03/17   [provider]  mirtazapine (REMERON) 30 MG tablet Take 30 mg by mouth at bedtime. 08/21/18   [provider]  ondansetron (ZOFRAN) 4 MG tablet Take 1 tablet (4 mg total) by mouth every 6 (six) hours. 01/25/19   Marney Setting, NP  oxyCODONE-acetaminophen (PERCOCET/ROXICET) 5-325 MG tablet Take 2 tablets by mouth every 4 (four) hours as needed for severe pain. 12/12/18   McDonald, [name], PA-C  promethazine (PHENERGAN) 25 MG tablet Take 1 tablet (25 mg total) by mouth every 6 (six) hours as needed for nausea or vomiting. 01/29/19   Fransico Meadow, PA-C  promethazine-dextromethorphan (PROMETHAZINE-DM) 6.25-15 MG/5ML syrup Take 5 mLs by mouth at bedtime. PRN COUGH 02/04/19   Rodriguez-Southworth, Sunday Spillers, PA-C  RYVENT 6 MG TABS Take 6 mg by mouth daily. 10/06/17   [provider]  Topiramate ER (TROKENDI XR) 200 MG CP24 Take 200 mg by mouth at bedtime.    [provider]    Family History Family History  Problem Relation Age of Onset  . Cancer Mother   . CAD Mother   . Hypertension Sister   . Sickle cell trait Daughter   . Asthma Son   . Asthma Paternal Grandmother   . Pseudochol deficiency Neg Hx   . Malignant hyperthermia Neg Hx   . Anesthesia problems Neg Hx    . Hypotension Neg Hx     Social History Social History   Tobacco Use  . Smoking status: Current Every Day Smoker    Packs/day: 0.10    Years: 18.00    Pack years: 1.80    Types: Cigarettes  . Smokeless tobacco: Never Used  Substance Use Topics  . Alcohol use: Yes    Comment: Rare   . Drug use: Not Currently    Types: Marijuana     Allergies   Shrimp [shellfish allergy]   Review of Systems Review of Systems   Physical Exam Triage Vital Signs ED Triage Vitals [05/30/19 1738]  Enc Vitals Group     BP 126/84     Pulse Rate 99     Resp 17     Temp 98.8 F (37.1 C)     Temp Source Oral     SpO2 96 %     Weight      Height      Head Circumference      Peak Flow      Pain Score 0     Pain Loc      Pain Edu?      Excl. in [place]?    No data found.  Updated Vital Signs BP 126/84 (BP Location: Left Arm)   Pulse 99   Temp 98.8 F (37.1 C) (Oral)   Resp 17   LMP 05/16/2019   SpO2 96%    Physical Exam Constitutional:      General: She is not in acute distress.    Appearance: She is well-developed.  Cardiovascular:     Rate and Rhythm: Normal rate.  Pulmonary:     Effort: Pulmonary effort is normal.  Genitourinary:    Vagina: Normal.     Cervix: No cervical motion tenderness, discharge, friability, lesion or cervical bleeding.     Uterus: Normal.      Adnexa:        Right: Tenderness present. No mass.       Comments: No blood within vagina, cervix WNL Skin:    General: Skin is warm.  Neurological:     Mental Status: She is alert and oriented to person, place, and time.      UC Treatments / Results  Labs (all labs ordered are listed, but only abnormal results are displayed) Labs Reviewed  POC URINE PREG, ED  POCT PREGNANCY, URINE  CERVICOVAGINAL ANCILLARY ONLY    EKG   Radiology No results found.  Procedures Procedures (including critical care time)  Medications Ordered in UC Medications - No data to display  Initial Impression /  Assessment and Plan / UC Course  I have reviewed the triage vital signs and the nursing notes.  Pertinent labs & imaging results that were available during my care of the patient were reviewed by me and considered in my medical decision making (see chart for details).     Negative pregnancy test here today. Exam benign. Vaginal cytology collected and pending. Will notify of any positive findings and if any changes to treatment are needed.  Has follow up with gyn next week. Return precautions provided. Patient verbalized understanding and agreeable to plan.   Final Clinical Impressions(s) / UC Diagnoses   Final diagnoses:  Abnormal uterine bleeding     Discharge Instructions     Negative for pregnancy here today.  Will notify you of any positive findings from your vaginal swab and if any changes to treatment are needed.   You may monitor your results on your MyChart online as well.   Please continue to follow with your gynecologist for any persistent symptoms.     ED Prescriptions    None     Controlled Substance Prescriptions [place] Controlled Substance Registry consulted? Not Applicable   Zigmund Gottron, NP  05/30/19 1823  

## 2019-05-30 NOTE — Discharge Instructions (Signed)
Negative for pregnancy here today.  Will notify you of any positive findings from your vaginal swab and if any changes to treatment are needed.   You may monitor your results on your MyChart online as well.   Please continue to follow with your gynecologist for any persistent symptoms.

## 2019-05-30 NOTE — ED Triage Notes (Signed)
Pt presents with abnormal vaginal bleeding; pt believes she may be pregnant.

## 2019-06-03 LAB — CERVICOVAGINAL ANCILLARY ONLY
Bacterial vaginitis: POSITIVE — AB
Candida vaginitis: NEGATIVE
Chlamydia: NEGATIVE
Neisseria Gonorrhea: NEGATIVE
Trichomonas: NEGATIVE

## 2019-06-05 ENCOUNTER — Telehealth: Payer: Self-pay | Admitting: Emergency Medicine

## 2019-06-05 MED ORDER — METRONIDAZOLE 500 MG PO TABS: 500.0000 mg | ORAL_TABLET | Freq: Two times a day (BID) | ORAL | 0 refills | Status: AC

## 2019-06-06 ENCOUNTER — Telehealth (HOSPITAL_COMMUNITY): Payer: Self-pay | Admitting: Emergency Medicine

## 2019-06-06 NOTE — Telephone Encounter (Signed)
Attempted to reach patient x2. No answer at this time. Voicemail left.    

## 2019-06-06 NOTE — Telephone Encounter (Signed)
Pt returned my call, results of swab given to patient. Pt will pick up medicine today.

## 2019-06-18 ENCOUNTER — Encounter (HOSPITAL_COMMUNITY): Payer: Self-pay

## 2019-06-18 ENCOUNTER — Other Ambulatory Visit: Payer: Self-pay

## 2019-06-18 ENCOUNTER — Emergency Department (HOSPITAL_COMMUNITY)
Admission: EM | Admit: 2019-06-18 | Discharge: 2019-06-18 | Disposition: A | Discharge status: Home / Self Care | Payer: Medicaid Other | Attending: Emergency Medicine | Admitting: Emergency Medicine

## 2019-06-18 DIAGNOSIS — R51 Headache: Secondary | ICD-10-CM | POA: Insufficient documentation

## 2019-06-18 DIAGNOSIS — R1030 Lower abdominal pain, unspecified: Secondary | ICD-10-CM | POA: Diagnosis not present

## 2019-06-18 DIAGNOSIS — E860 Dehydration: Secondary | ICD-10-CM | POA: Insufficient documentation

## 2019-06-18 DIAGNOSIS — M7918 Myalgia, other site: Secondary | ICD-10-CM | POA: Diagnosis not present

## 2019-06-18 DIAGNOSIS — I10 Essential (primary) hypertension: Secondary | ICD-10-CM | POA: Insufficient documentation

## 2019-06-18 DIAGNOSIS — R05 Cough: Secondary | ICD-10-CM | POA: Insufficient documentation

## 2019-06-18 DIAGNOSIS — Z79899 Other long term (current) drug therapy: Secondary | ICD-10-CM | POA: Insufficient documentation

## 2019-06-18 DIAGNOSIS — M545 Low back pain: Secondary | ICD-10-CM | POA: Insufficient documentation

## 2019-06-18 DIAGNOSIS — F17210 Nicotine dependence, cigarettes, uncomplicated: Secondary | ICD-10-CM | POA: Diagnosis not present

## 2019-06-18 DIAGNOSIS — M546 Pain in thoracic spine: Secondary | ICD-10-CM | POA: Diagnosis not present

## 2019-06-18 DIAGNOSIS — R0981 Nasal congestion: Secondary | ICD-10-CM | POA: Diagnosis present

## 2019-06-18 LAB — COMPREHENSIVE METABOLIC PANEL
ALT: 13 U/L (ref 0–44)
AST: 16 U/L (ref 15–41)
Albumin: 3.8 g/dL (ref 3.5–5.0)
Alkaline Phosphatase: 69 U/L (ref 38–126)
Anion gap: 9 (ref 5–15)
BUN: 13 mg/dL (ref 6–20)
CO2: 23 mmol/L (ref 22–32)
Calcium: 8.8 mg/dL — ABNORMAL LOW (ref 8.9–10.3)
Chloride: 105 mmol/L (ref 98–111)
Creatinine, Ser: 0.69 mg/dL (ref 0.44–1.00)
GFR calc Af Amer: >60 mL/min (ref >60)
GFR calc non Af Amer: >60 mL/min (ref >60)
Glucose, Bld: 115 mg/dL — ABNORMAL HIGH (ref 70–99)
Potassium: 3.6 mmol/L (ref 3.5–5.1)
Sodium: 137 mmol/L (ref 135–145)
Total Bilirubin: 0.5 mg/dL (ref 0.3–1.2)
Total Protein: 7.8 g/dL (ref 6.5–8.1)

## 2019-06-18 LAB — CBC WITH DIFFERENTIAL/PLATELET
Abs Immature Granulocytes: 0.03 10*3/uL (ref 0.00–0.07)
Basophils Absolute: 0.1 10*3/uL (ref 0.0–0.1)
Basophils Relative: 1 %
Eosinophils Absolute: 0.5 10*3/uL (ref 0.0–0.5)
Eosinophils Relative: 5 %
HCT: 39 % (ref 36.0–46.0)
Hemoglobin: 12.3 g/dL (ref 12.0–15.0)
Immature Granulocytes: 0 %
Lymphocytes Relative: 29 %
Lymphs Abs: 2.8 10*3/uL (ref 0.7–4.0)
MCH: 26.1 pg (ref 26.0–34.0)
MCHC: 31.5 g/dL (ref 30.0–36.0)
MCV: 82.6 fL (ref 80.0–100.0)
Monocytes Absolute: 0.6 10*3/uL (ref 0.1–1.0)
Monocytes Relative: 6 %
Neutro Abs: 5.8 10*3/uL (ref 1.7–7.7)
Neutrophils Relative %: 59 %
Platelets: 346 10*3/uL (ref 150–400)
RBC: 4.72 MIL/uL (ref 3.87–5.11)
RDW: 16 % — ABNORMAL HIGH (ref 11.5–15.5)
WBC: 9.8 10*3/uL (ref 4.0–10.5)
nRBC: 0 % (ref 0.0–0.2)

## 2019-06-18 LAB — URINALYSIS, ROUTINE W REFLEX MICROSCOPIC
Bilirubin Urine: NEGATIVE
Glucose, UA: NEGATIVE mg/dL
Ketones, ur: NEGATIVE mg/dL
Nitrite: NEGATIVE
Protein, ur: 30 mg/dL — AB
RBC / HPF: >50 RBC/hpf — ABNORMAL HIGH (ref 0–5)
Specific Gravity, Urine: 1.031 — ABNORMAL HIGH (ref 1.005–1.030)
pH: 5 (ref 5.0–8.0)

## 2019-06-18 LAB — LIPASE, BLOOD: Lipase: 45 U/L (ref 11–51)

## 2019-06-18 LAB — HCG, QUANTITATIVE, PREGNANCY: hCG, Beta Chain, Quant, S: <1 m[IU]/mL (ref <5)

## 2019-06-18 MED ORDER — METOCLOPRAMIDE HCL 5 MG/ML IJ SOLN
10.0000 mg | Freq: Once | INTRAMUSCULAR | Status: AC
  Administered 2019-06-18: 10 mg via INTRAVENOUS
  Filled 2019-06-18: qty 2

## 2019-06-18 MED ORDER — ONDANSETRON HCL 4 MG/2ML IJ SOLN
4.0000 mg | Freq: Once | INTRAMUSCULAR | Status: DC
  Filled 2019-06-18: qty 2

## 2019-06-18 MED ORDER — KETOROLAC TROMETHAMINE 30 MG/ML IJ SOLN
15.0000 mg | Freq: Once | INTRAMUSCULAR | Status: AC
  Administered 2019-06-18: 15 mg via INTRAVENOUS
  Filled 2019-06-18: qty 1

## 2019-06-18 MED ORDER — SODIUM CHLORIDE 0.9 % IV BOLUS
1000.0000 mL | Freq: Once | INTRAVENOUS | Status: AC
  Administered 2019-06-18: 1000 mL via INTRAVENOUS

## 2019-06-18 MED ORDER — METHOCARBAMOL 500 MG PO TABS: 500.0000 mg | ORAL_TABLET | Freq: Two times a day (BID) | ORAL | 0 refills | Status: DC | PRN

## 2019-06-18 MED ORDER — METHOCARBAMOL 500 MG PO TABS
750.0000 mg | ORAL_TABLET | Freq: Once | ORAL | Status: AC
  Administered 2019-06-18: 750 mg via ORAL
  Filled 2019-06-18: qty 2

## 2019-06-18 MED ORDER — PROMETHAZINE HCL 25 MG PO TABS: 25.0000 mg | ORAL_TABLET | Freq: Four times a day (QID) | ORAL | 0 refills | Status: DC | PRN

## 2019-06-18 NOTE — ED Provider Notes (Signed)
[facility] DEPT Provider Note   CSN: [id number] Arrival date & time: 06/18/19  0448     History   Chief Complaint Chief Complaint  Patient presents with   Abdominal Pain   Headache    HPI Penny [name] is a 38 y.o. female.     Patient is a 38 year old female with a history of scoliosis, chronic back pain, hypertension, migraines who is presenting today with multiple complaints.  Patient states 3 weeks ago she developed a sinus infection with nasal congestion, drainage and severe cough.  She never had a fever with this and states after approximately 1 week and a course of antibiotics she started feeling better.  For the last 2 weeks she has been gradually improving but continues to have cough.  The cough is nonproductive and she is not short of breath.  However she has a history of chronic back pain and she usually sees a chiropractor 3 times a week but during the time that she was sick she did not see the chiropractor and missed 6 sessions.  She has had persistently worsening generalized back pain throughout the cervical thoracic and lumbar regions.  She did see the chiropractor once last week and then yesterday and states he did some adjustments and she been taking ibuprofen and occasional gabapentin but states the pain is just not getting better.  Movement seems to make the pain worse it is a deep throbbing pain.  Certain twisting movements and reaching will also make it worse.  The pain in the cervical region even radiates into her head causing headaches.  She has had nausea as well as occasional vomiting.  The last time she vomited was Saturday and Sunday.  She denies any visual changes, diarrhea or fever.  She is also having some pain in the right groin that is not significantly worsened.  She denies any dysuria, frequency or urgency.  She is currently on her menses but denies any vaginal discharge.  She is sexually active with only her husband for a  prolonged period of time.  She does not use protection and is not concerned for sexually transmitted infections.  The history is provided by the patient.  Abdominal Pain Headache Associated symptoms: abdominal pain     Past Medical History:  Diagnosis Date   ADHD (attention deficit hyperactivity disorder)    Anxiety    no meds   Chronic back pain    Depression    no meds   Essential hypertension    a. Dx 06/2016.   Migraines    last one over 1 month ago   Overweight    Palpitations - Sinus Tachycardia    a. 12/2016 Echo: E f55-60%, no rwma, nl LA/RA sizes.   Placenta previa    w/ 2 pregnancies.   Preterm labor    c/s at 36 wks   Scoliosis    Sinus tachycardia     Patient Active Problem List   Diagnosis Date Noted   Uterine fibroid 01/23/2016   Right groin pain 01/23/2016   History of C-section 01/23/2016   Migraines    Anxiety    Depression     Past Surgical History:  Procedure Laterality Date   CESAREAN SECTION  2010   x 1 at 36 wks in [place]   CESAREAN SECTION  February 13, 2012   CHROMOPERTUBATION  10/26/2015   Procedure: ATTEMPTED CHROMOPERTUBATION;  Surgeon: Crawford Givens, MD;  Location: [facility];  Service: Gynecology;;   LAPAROSCOPIC  LYSIS OF ADHESIONS  10/26/2015   Procedure: LAPAROSCOPIC LYSIS OF ADHESIONS;  Surgeon: Crawford Givens, MD;  Location: [facility];  Service: Gynecology;;   LAPAROSCOPY N/A 10/26/2015   Procedure: LAPAROSCOPY OPERATIVE WITH REMOVAL OF MISPLACED FILSHE CLIP;  Surgeon: Crawford Givens, MD;  Location: [facility];  Service: Gynecology;  Laterality: N/A;   SVD  2003   x 1 in [place]   TUBAL LIGATION     WISDOM TOOTH EXTRACTION       OB History    Gravida  3   Para  3   Term  1   Preterm  2   AB  0   Living  3     SAB  0   TAB  0   Ectopic  0   Multiple  0   Live Births  3            Home Medications    Prior to Admission medications   Medication Sig Start Date End Date Taking? Authorizing  Provider  AIMOVIG 140 MG/ML SOAJ Inject 1,140 mg into the skin every 30 (thirty) days.  05/14/18   [provider]  atomoxetine (STRATTERA) 60 MG capsule Take 60 mg by mouth daily. 08/21/18   [provider]  Cetirizine HCl 10 MG CAPS Take 1 capsule (10 mg total) by mouth daily for 10 days. 02/11/19 02/21/19  Wieters, Hallie C, PA-C  cyclobenzaprine (FLEXERIL) 10 MG tablet Take 10 mg by mouth daily as needed for muscle spasms (pain).    [provider]  Dextromethorphan HBr 10 MG/5ML SYRP 10 ml qhs prn cough at night time. 02/04/19   Rodriguez-Southworth, Sunday Spillers, PA-C  DULoxetine (CYMBALTA) 30 MG capsule Take 30 mg by mouth daily. Taking with 60 mg daily to equal 90 mg    [provider]  DULoxetine (CYMBALTA) 60 MG capsule Take 60 mg by mouth daily. takin g with 30 mg to equal 90 mg 06/08/17   [provider]  gabapentin (NEURONTIN) 100 MG capsule Take 200 mg by mouth 2 (two) times daily. 08/31/18   [provider]  hydrOXYzine (VISTARIL) 50 MG capsule Take 50 mg by mouth every 8 (eight) hours as needed for anxiety. 08/21/18   [provider]  ibuprofen (ADVIL,MOTRIN) 600 MG tablet Take 1 tablet (600 mg total) by mouth every 8 (eight) hours as needed. 02/11/19   Wieters, Hallie C, PA-C  LORazepam (ATIVAN) 0.5 MG tablet Take 0.5 mg by mouth 3 (three) times daily as needed for anxiety. 08/21/18   [provider]  metoprolol succinate (TOPROL-XL) 25 MG 24 hr tablet Take 25 mg by mouth 2 (two) times daily.  03/03/17   [provider]  mirtazapine (REMERON) 30 MG tablet Take 30 mg by mouth at bedtime. 08/21/18   [provider]  ondansetron (ZOFRAN) 4 MG tablet Take 1 tablet (4 mg total) by mouth every 6 (six) hours. 01/25/19   Marney Setting, NP  oxyCODONE-acetaminophen (PERCOCET/ROXICET) 5-325 MG tablet Take 2 tablets by mouth every 4 (four) hours as needed for severe pain. 12/12/18   McDonald, [name], PA-C  promethazine  (PHENERGAN) 25 MG tablet Take 1 tablet (25 mg total) by mouth every 6 (six) hours as needed for nausea or vomiting. 01/29/19   Fransico Meadow, PA-C  promethazine-dextromethorphan (PROMETHAZINE-DM) 6.25-15 MG/5ML syrup Take 5 mLs by mouth at bedtime. PRN COUGH 02/04/19   Rodriguez-Southworth, Sunday Spillers, PA-C  RYVENT 6 MG TABS Take 6 mg by mouth daily. 10/06/17  [provider]  Topiramate ER (TROKENDI XR) 200 MG CP24 Take 200 mg by mouth at bedtime.    [provider]    Family History Family History  Problem Relation Age of Onset   Cancer Mother    CAD Mother    Hypertension Sister    Sickle cell trait Daughter    Asthma Son    Asthma Paternal Grandmother    Pseudochol deficiency Neg Hx    Malignant hyperthermia Neg Hx    Anesthesia problems Neg Hx    Hypotension Neg Hx     Social History Social History   Tobacco Use   Smoking status: Current Every Day Smoker    Packs/day: 0.10    Years: 18.00    Pack years: 1.80    Types: Cigarettes   Smokeless tobacco: Never Used  Substance Use Topics   Alcohol use: Yes    Comment: Rare    Drug use: Not Currently    Types: Marijuana     Allergies   Shrimp [shellfish allergy]   Review of Systems Review of Systems  Gastrointestinal: Positive for abdominal pain.  Musculoskeletal:       Also stating left knee is hurting all the way around the knee but denies trauma or swelling  Neurological: Positive for headaches.  All other systems reviewed and are negative.    Physical Exam Updated Vital Signs BP (!) 141/92    Pulse 96    Temp 98.8 F (37.1 C) (Oral)    Resp 16    Ht 5\' 4"  (1.626 m)    Wt 86.2 kg    LMP 06/18/2019    SpO2 97%    BMI 32.61 kg/m   Physical Exam Vitals signs and nursing note reviewed.  Constitutional:      General: She is not in acute distress.    Appearance: She is well-developed. She is obese.  HENT:     Head: Normocephalic and atraumatic.     Right Ear: Tympanic membrane  normal.     Left Ear: Tympanic membrane normal.     Mouth/Throat:     Mouth: Mucous membranes are moist.  Eyes:     Extraocular Movements: Extraocular movements intact.     Pupils: Pupils are equal, round, and reactive to light.  Neck:     Musculoskeletal: Normal range of motion and neck supple. Muscular tenderness present. No spinous process tenderness.   Cardiovascular:     Rate and Rhythm: Normal rate and regular rhythm.     Heart sounds: Normal heart sounds. No murmur. No friction rub.  Pulmonary:     Effort: Pulmonary effort is normal.     Breath sounds: Normal breath sounds. No wheezing or rales.  Abdominal:     General: Bowel sounds are normal. There is no distension.     Palpations: Abdomen is soft.     Tenderness: There is abdominal tenderness. There is no guarding or rebound.       Comments: No specific abdominal pain.  Mild right groin discomfort  Musculoskeletal: Normal range of motion.     Left knee: She exhibits normal range of motion, no swelling and no deformity. Tenderness found. Medial joint line and lateral joint line tenderness noted.     Thoracic back: She exhibits tenderness and spasm. She exhibits no bony tenderness.     Lumbar back: She exhibits tenderness, pain and spasm. She exhibits no bony tenderness.       Back:     Comments: No  edema  Skin:    General: Skin is warm and dry.     Findings: No rash.  Neurological:     Mental Status: She is alert and oriented to person, place, and time. Mental status is at baseline.     Cranial Nerves: No cranial nerve deficit.  Psychiatric:        Mood and Affect: Mood normal.        Behavior: Behavior normal.        Thought Content: Thought content normal.      ED Treatments / Results  Labs (all labs ordered are listed, but only abnormal results are displayed) Labs Reviewed  CBC WITH DIFFERENTIAL/PLATELET - Abnormal; Notable for the following components:      Result Value   RDW 16.0 (*)    All other  components within normal limits  COMPREHENSIVE METABOLIC PANEL - Abnormal; Notable for the following components:   Glucose, Bld 115 (*)    Calcium 8.8 (*)    All other components within normal limits  URINALYSIS, ROUTINE W REFLEX MICROSCOPIC - Abnormal; Notable for the following components:   APPearance HAZY (*)    Specific Gravity, Urine 1.031 (*)    Hgb urine dipstick LARGE (*)    Protein, ur 30 (*)    Leukocytes,Ua SMALL (*)    RBC / HPF >50 (*)    Bacteria, UA RARE (*)    All other components within normal limits  LIPASE, BLOOD  HCG, QUANTITATIVE, PREGNANCY    EKG None  Radiology No results found.  Procedures Procedures (including critical care time)  Medications Ordered in ED Medications  sodium chloride 0.9 % bolus 1,000 mL (has no administration in time range)  ketorolac (TORADOL) 30 MG/ML injection 15 mg (has no administration in time range)  ondansetron (ZOFRAN) injection 4 mg (has no administration in time range)  methocarbamol (ROBAXIN) tablet 750 mg (has no administration in time range)     Initial Impression / Assessment and Plan / ED Course  I have reviewed the triage vital signs and the nursing notes.  Pertinent labs & imaging results that were available during my care of the patient were reviewed by me and considered in my medical decision making (see chart for details).        Patient is a 38 year old female presenting today with multiple vague complaints.  All stemming from URI and sinus infection 3 weeks ago which is improving but now having diffuse pain.  Patient has missed multiple chiropractic appointments and has diffuse muscular pain throughout her back.  She has started to go back and the pain has worsened since she recently has had an adjustment yesterday.  She is not having radiation of her pain suggestive of sciatica is not having any numbness or weakness concerning for cord compromise.  Patient denies any urinary or vaginal symptoms.  She  states her URI symptoms are improving and her breath sounds are clear with low suspicion for pneumonia.  She has been taking ibuprofen at home but has not been taking her gabapentin regularly.  Low suspicion for an acute abdominal pathology as her abdomen is soft and only tender along the inguinal canal.  There is no evidence of hernia.  Feel patient's pain is musculoskeletal and related to her chronic issues as well as missing her chiropractic appointments.  However she states she has not been eating much and has had ongoing nausea.  Will check labs to ensure no other acute process such as electrolyte abnormality.  Patient given IV fluids, Reglan and Toradol for her headache and diffuse pain as well as Robaxin.  9:17 AM Labs are reassuring except for elevated specific gravity in the urine.  Patient feels better after meds and fluids.  We will plan on discharging home with antiemetic and muscle relaxer.  Patient will continue ibuprofen and gabapentin at home.  Final Clinical Impressions(s) / ED Diagnoses   Final diagnoses:  Dehydration  Musculoskeletal pain    ED Discharge Orders         Ordered    promethazine (PHENERGAN) 25 MG tablet  Every 6 hours PRN     06/18/19 0919    methocarbamol (ROBAXIN) 500 MG tablet  2 times daily PRN     06/18/19 0919           Blanchie Dessert, MD 06/18/19 [phone]

## 2019-06-18 NOTE — Discharge Instructions (Signed)
Continue to make sure you are drinking plenty of fluids.  Take your ibuprofen like you are doing but you also may want to take your gabapentin more regularly to help with the pain.  You can use the muscle relaxer and nausea medicine as needed.  Continue following up with your chiropractor so they can continue to do adjustments to hopefully get you back in alignment which will also help with your pain.  Today your lab work showed that you were dehydrated but your liver, kidneys, electrolytes and blood counts all looked normal.  There was no sign of infection today.

## 2019-06-18 NOTE — ED Triage Notes (Signed)
Pt complains of a headache, neck pain that radiates down her back, she also complains of abdominal pain and leg pain

## 2019-06-22 ENCOUNTER — Other Ambulatory Visit: Payer: Self-pay

## 2019-06-22 ENCOUNTER — Encounter (HOSPITAL_COMMUNITY): Payer: Self-pay | Admitting: Emergency Medicine

## 2019-06-22 ENCOUNTER — Emergency Department (HOSPITAL_COMMUNITY): Payer: Medicaid Other

## 2019-06-22 ENCOUNTER — Emergency Department (HOSPITAL_COMMUNITY)
Admission: EM | Admit: 2019-06-22 | Discharge: 2019-06-22 | Disposition: A | Discharge status: Home / Self Care | Payer: Medicaid Other | Attending: Emergency Medicine | Admitting: Emergency Medicine

## 2019-06-22 DIAGNOSIS — G43909 Migraine, unspecified, not intractable, without status migrainosus: Secondary | ICD-10-CM | POA: Diagnosis not present

## 2019-06-22 DIAGNOSIS — H53149 Visual discomfort, unspecified: Secondary | ICD-10-CM | POA: Diagnosis not present

## 2019-06-22 DIAGNOSIS — R110 Nausea: Secondary | ICD-10-CM | POA: Diagnosis not present

## 2019-06-22 DIAGNOSIS — I10 Essential (primary) hypertension: Secondary | ICD-10-CM | POA: Insufficient documentation

## 2019-06-22 DIAGNOSIS — Z79899 Other long term (current) drug therapy: Secondary | ICD-10-CM | POA: Diagnosis not present

## 2019-06-22 DIAGNOSIS — R51 Headache: Secondary | ICD-10-CM | POA: Diagnosis present

## 2019-06-22 DIAGNOSIS — F17210 Nicotine dependence, cigarettes, uncomplicated: Secondary | ICD-10-CM | POA: Insufficient documentation

## 2019-06-22 LAB — CBC WITH DIFFERENTIAL/PLATELET
Abs Immature Granulocytes: 0.05 10*3/uL (ref 0.00–0.07)
Basophils Absolute: 0.1 10*3/uL (ref 0.0–0.1)
Basophils Relative: 1 %
Eosinophils Absolute: 0.2 10*3/uL (ref 0.0–0.5)
Eosinophils Relative: 2 %
HCT: 39.8 % (ref 36.0–46.0)
Hemoglobin: 12.6 g/dL (ref 12.0–15.0)
Immature Granulocytes: 0 %
Lymphocytes Relative: 15 %
Lymphs Abs: 1.9 10*3/uL (ref 0.7–4.0)
MCH: 26 pg (ref 26.0–34.0)
MCHC: 31.7 g/dL (ref 30.0–36.0)
MCV: 82.2 fL (ref 80.0–100.0)
Monocytes Absolute: 0.4 10*3/uL (ref 0.1–1.0)
Monocytes Relative: 3 %
Neutro Abs: 10.1 10*3/uL — ABNORMAL HIGH (ref 1.7–7.7)
Neutrophils Relative %: 79 %
Platelets: 418 10*3/uL — ABNORMAL HIGH (ref 150–400)
RBC: 4.84 MIL/uL (ref 3.87–5.11)
RDW: 15.7 % — ABNORMAL HIGH (ref 11.5–15.5)
WBC: 12.7 10*3/uL — ABNORMAL HIGH (ref 4.0–10.5)
nRBC: 0 % (ref 0.0–0.2)

## 2019-06-22 LAB — BASIC METABOLIC PANEL
Anion gap: 12 (ref 5–15)
BUN: 9 mg/dL (ref 6–20)
CO2: 23 mmol/L (ref 22–32)
Calcium: 9.1 mg/dL (ref 8.9–10.3)
Chloride: 105 mmol/L (ref 98–111)
Creatinine, Ser: 0.67 mg/dL (ref 0.44–1.00)
GFR calc Af Amer: >60 mL/min (ref >60)
GFR calc non Af Amer: >60 mL/min (ref >60)
Glucose, Bld: 104 mg/dL — ABNORMAL HIGH (ref 70–99)
Potassium: 4.2 mmol/L (ref 3.5–5.1)
Sodium: 140 mmol/L (ref 135–145)

## 2019-06-22 LAB — URINALYSIS, ROUTINE W REFLEX MICROSCOPIC
Bilirubin Urine: NEGATIVE
Glucose, UA: NEGATIVE mg/dL
Ketones, ur: NEGATIVE mg/dL
Leukocytes,Ua: NEGATIVE
Nitrite: NEGATIVE
Protein, ur: NEGATIVE mg/dL
Specific Gravity, Urine: 1.014 (ref 1.005–1.030)
pH: 6 (ref 5.0–8.0)

## 2019-06-22 MED ORDER — SODIUM CHLORIDE 0.9 % IV BOLUS
1000.0000 mL | Freq: Once | INTRAVENOUS | Status: AC
  Administered 2019-06-22: 1000 mL via INTRAVENOUS

## 2019-06-22 MED ORDER — PROCHLORPERAZINE EDISYLATE 10 MG/2ML IJ SOLN
10.0000 mg | Freq: Once | INTRAMUSCULAR | Status: AC
  Administered 2019-06-22: 10 mg via INTRAVENOUS
  Filled 2019-06-22: qty 2

## 2019-06-22 MED ORDER — DIPHENHYDRAMINE HCL 50 MG/ML IJ SOLN
50.0000 mg | Freq: Once | INTRAMUSCULAR | Status: AC
  Administered 2019-06-22: 50 mg via INTRAVENOUS
  Filled 2019-06-22: qty 1

## 2019-06-22 NOTE — ED Notes (Signed)
Admin one medication via IV, then IV infiltrated. Charge RN to attempt again

## 2019-06-22 NOTE — ED Triage Notes (Signed)
[facility] transported pt from home to [facility] ED and reports the following:  Pt said she took 600 mg gabapentin 06/21/19 at 23:30, and it didn't help her headache. Hx of migraines. Gabapentin typically works.

## 2019-06-22 NOTE — ED Notes (Signed)
Pt returned from CT °

## 2019-06-22 NOTE — Discharge Instructions (Signed)
You were seen in the ED today for a headache We obtained a CT scan of your head which was reassuring and negative for any acute process We treated your headache today with medicines and fluids Please follow up with your PCP regarding your headaches

## 2019-06-22 NOTE — ED Notes (Signed)
Patient transported to CT 

## 2019-06-22 NOTE — ED Provider Notes (Signed)
[facility] DEPT Provider Note   CSN: [id number] Arrival date & time: 06/22/19  8469    History   Chief Complaint Chief Complaint  Patient presents with  . Headache    HPI Penny [name] is a 38 y.o. female with PMHx HTN, migraines, anxiety, and depression who presents to the ED today complaining of sudden onset, constant, severe, throbbing, diffuse headache that began last night around 11 PM (approximately 8 hours ago). Pt also complains of nausea, photophobia, and phonophobia. She states the headache feels worse than normal. She went to bed around 9 PM and felt fine and woke up with the headache 2 hours later. She takes Amovig, Trokendi, and gabapentin for her headache. The last time she had an Amovig injection was sometime last month. Pt reports she took her gabapentin last night while typically works but when it didn't she called EMS. Denies fever, chills, neck stiffness, rash, unilateral weakness or numbness, speech difficulty, confusion, vomiting, or any other associated symptoms.        Past Medical History:  Diagnosis Date  . ADHD (attention deficit hyperactivity disorder)   . Anxiety    no meds  . Chronic back pain   . Depression    no meds  . Essential hypertension    a. Dx 06/2016.  . Migraines    last one over 1 month ago  . Overweight   . Palpitations - Sinus Tachycardia    a. 12/2016 Echo: E f55-60%, no rwma, nl LA/RA sizes.  . Placenta previa    w/ 2 pregnancies.  . Preterm labor    c/s at 36 wks  . Scoliosis   . Sinus tachycardia     Patient Active Problem List   Diagnosis Date Noted  . Uterine fibroid 01/23/2016  . Right groin pain 01/23/2016  . History of C-section 01/23/2016  . Migraines   . Anxiety   . Depression     Past Surgical History:  Procedure Laterality Date  . CESAREAN SECTION  2010   x 1 at 36 wks in [place]  . CESAREAN SECTION  February 13, 2012  . CHROMOPERTUBATION  10/26/2015   Procedure: ATTEMPTED  CHROMOPERTUBATION;  Surgeon: Crawford Givens, MD;  Location: [facility];  Service: Gynecology;;  . LAPAROSCOPIC LYSIS OF ADHESIONS  10/26/2015   Procedure: LAPAROSCOPIC LYSIS OF ADHESIONS;  Surgeon: Crawford Givens, MD;  Location: [facility];  Service: Gynecology;;  . LAPAROSCOPY N/A 10/26/2015   Procedure: LAPAROSCOPY OPERATIVE WITH REMOVAL OF MISPLACED FILSHE CLIP;  Surgeon: Crawford Givens, MD;  Location: [facility];  Service: Gynecology;  Laterality: N/A;  . SVD  2003   x 1 in [place]  . TUBAL LIGATION    . WISDOM TOOTH EXTRACTION       OB History    Gravida  3   Para  3   Term  1   Preterm  2   AB  0   Living  3     SAB  0   TAB  0   Ectopic  0   Multiple  0   Live Births  3            Home Medications    Prior to Admission medications   Medication Sig Start Date End Date Taking? Authorizing Provider  AIMOVIG 140 MG/ML SOAJ Inject 1,140 mg into the skin every 30 (thirty) days.  05/14/18   [provider]  atomoxetine (STRATTERA) 60 MG capsule Take 60 mg by mouth daily.  08/21/18   [provider]  Cetirizine HCl 10 MG CAPS Take 1 capsule (10 mg total) by mouth daily for 10 days. 02/11/19 02/21/19  Wieters, Hallie C, PA-C  cyclobenzaprine (FLEXERIL) 10 MG tablet Take 10 mg by mouth daily as needed for muscle spasms (pain).    [provider]  Dextromethorphan HBr 10 MG/5ML SYRP 10 ml qhs prn cough at night time. 02/04/19   Rodriguez-Southworth, Sunday Spillers, PA-C  DULoxetine (CYMBALTA) 30 MG capsule Take 30 mg by mouth daily. Taking with 60 mg daily to equal 90 mg    [provider]  DULoxetine (CYMBALTA) 60 MG capsule Take 60 mg by mouth daily. takin g with 30 mg to equal 90 mg 06/08/17   [provider]  gabapentin (NEURONTIN) 100 MG capsule Take 200 mg by mouth 2 (two) times daily. 08/31/18   [provider]  hydrOXYzine (VISTARIL) 50 MG capsule Take 50 mg by mouth every 8 (eight) hours as needed for anxiety. 08/21/18   [provider]  ibuprofen (ADVIL,MOTRIN) 600 MG tablet Take 1 tablet (600 mg total) by mouth every 8 (eight) hours as needed. 02/11/19   Wieters, Hallie C, PA-C  LORazepam (ATIVAN) 0.5 MG tablet Take 0.5 mg by mouth 3 (three) times daily as needed for anxiety. 08/21/18   [provider]  methocarbamol (ROBAXIN) 500 MG tablet Take 1 tablet (500 mg total) by mouth 2 (two) times daily as needed for muscle spasms. 06/18/19   Blanchie Dessert, MD  metoprolol succinate (TOPROL-XL) 25 MG 24 hr tablet Take 25 mg by mouth 2 (two) times daily.  03/03/17   [provider]  mirtazapine (REMERON) 30 MG tablet Take 30 mg by mouth at bedtime. 08/21/18   [provider]  ondansetron (ZOFRAN) 4 MG tablet Take 1 tablet (4 mg total) by mouth every 6 (six) hours. 01/25/19   Marney Setting, NP  oxyCODONE-acetaminophen (PERCOCET/ROXICET) 5-325 MG tablet Take 2 tablets by mouth every 4 (four) hours as needed for severe pain. 12/12/18   McDonald, [name], PA-C  promethazine (PHENERGAN) 25 MG tablet Take 1 tablet (25 mg total) by mouth every 6 (six) hours as needed for nausea or vomiting. 06/18/19   Blanchie Dessert, MD  promethazine-dextromethorphan (PROMETHAZINE-DM) 6.25-15 MG/5ML syrup Take 5 mLs by mouth at bedtime. PRN COUGH 02/04/19   Rodriguez-Southworth, Sunday Spillers, PA-C  RYVENT 6 MG TABS Take 6 mg by mouth daily. 10/06/17   [provider]  Topiramate ER (TROKENDI XR) 200 MG CP24 Take 200 mg by mouth at bedtime.    [provider]    Family History Family History  Problem Relation Age of Onset  . Cancer Mother   . CAD Mother   . Hypertension Sister   . Sickle cell trait Daughter   . Asthma Son   . Asthma Paternal Grandmother   . Pseudochol deficiency Neg Hx   . Malignant hyperthermia Neg Hx   . Anesthesia problems Neg Hx   . Hypotension Neg Hx     Social History Social History   Tobacco Use  . Smoking status: Current Every Day Smoker    Packs/day: 0.10    Years:  18.00    Pack years: 1.80    Types: Cigarettes  . Smokeless tobacco: Never Used  Substance Use Topics  . Alcohol use: Yes    Comment: Rare   . Drug use: Not Currently    Types: Marijuana     Allergies   Shrimp [shellfish allergy]  Review of Systems Review of Systems  Constitutional: Negative for chills and fever.  HENT: Negative for congestion.   Eyes: Positive for photophobia. Negative for visual disturbance.  Respiratory: Negative for cough.   Cardiovascular: Negative for chest pain.  Gastrointestinal: Positive for nausea. Negative for abdominal pain and vomiting.  Genitourinary: Negative for difficulty urinating.  Musculoskeletal: Negative for neck stiffness.  Skin: Negative for rash.  Neurological: Positive for headaches. Negative for syncope and speech difficulty.     Physical Exam Updated Vital Signs BP (!) 145/95   Pulse 90   Temp 98.5 F (36.9 C) (Oral)   Ht 5\' 4"  (1.626 m)   Wt 87.1 kg   LMP 06/18/2019   SpO2 99%   BMI 32.96 kg/m   Physical Exam Vitals signs and nursing note reviewed.  Constitutional:      Comments: Very uncomfortable appearing female  HENT:     Head: Normocephalic and atraumatic.  Eyes:     Extraocular Movements: Extraocular movements intact.     Conjunctiva/sclera: Conjunctivae normal.     Pupils: Pupils are equal, round, and reactive to light.  Neck:     Musculoskeletal: Neck supple.  Cardiovascular:     Rate and Rhythm: Normal rate and regular rhythm.     Heart sounds: Normal heart sounds.  Pulmonary:     Effort: Pulmonary effort is normal.     Breath sounds: Normal breath sounds. No wheezing, rhonchi or rales.  Abdominal:     Palpations: Abdomen is soft.     Tenderness: There is no abdominal tenderness.  Skin:    General: Skin is warm and dry.  Neurological:     Mental Status: She is alert.     Comments: CN 3-12 grossly intact A&O x4 GCS 15 Sensation and strength intact Coordination with finger-to-nose WNL Neg  pronator drift       ED Treatments / Results  Labs (all labs ordered are listed, but only abnormal results are displayed) Labs Reviewed  BASIC METABOLIC PANEL - Abnormal; Notable for the following components:      Result Value   Glucose, Bld 104 (*)    All other components within normal limits  CBC WITH DIFFERENTIAL/PLATELET - Abnormal; Notable for the following components:   WBC 12.7 (*)    RDW 15.7 (*)    Platelets 418 (*)    Neutro Abs 10.1 (*)    All other components within normal limits  URINALYSIS, ROUTINE W REFLEX MICROSCOPIC - Abnormal; Notable for the following components:   Hgb urine dipstick MODERATE (*)    Bacteria, UA RARE (*)    All other components within normal limits    EKG None  Radiology Ct Head Wo Contrast  Result Date: 06/22/2019 CLINICAL DATA:  Severe acute headache since 11 p.m. last night. Normal neurologic examination. No reported injury. EXAM: CT HEAD WITHOUT CONTRAST TECHNIQUE: Contiguous axial images were obtained from the base of the skull through the vertex without intravenous contrast. COMPARISON:  None. FINDINGS: Brain: No evidence of parenchymal hemorrhage or extra-axial fluid collection. No mass lesion, mass effect, or midline shift. No CT evidence of acute infarction. Cerebral volume is age appropriate. No ventriculomegaly. Vascular: No acute abnormality. Skull: No evidence of calvarial fracture. Sinuses/Orbits: The visualized paranasal sinuses are essentially clear. Other:  The mastoid air cells are unopacified. IMPRESSION: Negative head CT.  No evidence of acute intracranial abnormality. Electronically Signed   By: Ilona Sorrel M.D.   On: 06/22/2019 08:40    Procedures Procedures (including  critical care time)  Medications Ordered in ED Medications  prochlorperazine (COMPAZINE) injection 10 mg (10 mg Intravenous Given 06/22/19 0753)  diphenhydrAMINE (BENADRYL) injection 50 mg (50 mg Intravenous Given 06/22/19 0737)  sodium chloride 0.9 % bolus  1,000 mL (1,000 mLs Intravenous New Bag/Given 06/22/19 0805)     Initial Impression / Assessment and Plan / ED Course  I have reviewed the triage vital signs and the nursing notes.  Pertinent labs & imaging results that were available during my care of the patient were reviewed by me and considered in my medical decision making (see chart for details).    38 year old female with hx of migraines presents to the ED today with headache, nausea, photophobia, and phonophobia - describes it as worse than typical headaches. Took gabapentin without relief. No vomiting. No concerning meningeal signs today. Pt does appear very uncomfortable during examination. She has been seen in the ED previously for headaches. Considering she reports this headache does not feel like her previous will proceed with CT Head today. Headache cocktail given without toradol awaiting CT scan and baseline bloodwork. Will reevaluate once labs and imaging return.   Mild elevation in WBCs at 12.7; could be related to pain. Again patient without infectious symptoms. Bmp without electrolyte abnormalities. U/A without infection. CT Head negative for acute process.   Per nursing staff patient is ready to go home. Advised she follow up with her PCP regarding her frequent headaches. Strict return precautions discussed as well. She is in agreement with plan and stable for discharge home.        Final Clinical Impressions(s) / ED Diagnoses   Final diagnoses:  Migraine without status migrainosus, not intractable, unspecified migraine type    ED Discharge Orders    None       Eustaquio Maize, PA-C 06/22/19 1612    Julianne Rice, MD 06/28/19 1751

## 2019-07-28 ENCOUNTER — Encounter (HOSPITAL_COMMUNITY): Payer: Self-pay

## 2019-07-28 ENCOUNTER — Other Ambulatory Visit: Payer: Self-pay

## 2019-07-28 ENCOUNTER — Emergency Department (HOSPITAL_COMMUNITY)
Admission: EM | Admit: 2019-07-28 | Discharge: 2019-07-28 | Disposition: A | Discharge status: Home / Self Care | Payer: Medicaid Other | Attending: Emergency Medicine | Admitting: Emergency Medicine

## 2019-07-28 DIAGNOSIS — R202 Paresthesia of skin: Secondary | ICD-10-CM

## 2019-07-28 DIAGNOSIS — I10 Essential (primary) hypertension: Secondary | ICD-10-CM | POA: Insufficient documentation

## 2019-07-28 DIAGNOSIS — F17210 Nicotine dependence, cigarettes, uncomplicated: Secondary | ICD-10-CM | POA: Diagnosis not present

## 2019-07-28 DIAGNOSIS — Z79899 Other long term (current) drug therapy: Secondary | ICD-10-CM | POA: Diagnosis not present

## 2019-07-28 LAB — BASIC METABOLIC PANEL
Anion gap: 9 (ref 5–15)
BUN: 16 mg/dL (ref 6–20)
CO2: 21 mmol/L — ABNORMAL LOW (ref 22–32)
Calcium: 8.9 mg/dL (ref 8.9–10.3)
Chloride: 107 mmol/L (ref 98–111)
Creatinine, Ser: 0.83 mg/dL (ref 0.44–1.00)
GFR calc Af Amer: >60 mL/min (ref >60)
GFR calc non Af Amer: >60 mL/min (ref >60)
Glucose, Bld: 105 mg/dL — ABNORMAL HIGH (ref 70–99)
Potassium: 5.5 mmol/L — ABNORMAL HIGH (ref 3.5–5.1)
Sodium: 137 mmol/L (ref 135–145)

## 2019-07-28 LAB — CBC
HCT: 40.7 % (ref 36.0–46.0)
Hemoglobin: 12.6 g/dL (ref 12.0–15.0)
MCH: 25.6 pg — ABNORMAL LOW (ref 26.0–34.0)
MCHC: 31 g/dL (ref 30.0–36.0)
MCV: 82.7 fL (ref 80.0–100.0)
Platelets: 431 10*3/uL — ABNORMAL HIGH (ref 150–400)
RBC: 4.92 MIL/uL (ref 3.87–5.11)
RDW: 15.7 % — ABNORMAL HIGH (ref 11.5–15.5)
WBC: 11.4 10*3/uL — ABNORMAL HIGH (ref 4.0–10.5)
nRBC: 0 % (ref 0.0–0.2)

## 2019-07-28 LAB — POC URINE PREG, ED: Preg Test, Ur: NEGATIVE

## 2019-07-28 MED ORDER — PROMETHAZINE HCL 25 MG PO TABS
25.0000 mg | ORAL_TABLET | ORAL | Status: AC
  Administered 2019-07-28: 25 mg via ORAL
  Filled 2019-07-28: qty 1

## 2019-07-28 NOTE — ED Provider Notes (Signed)
[facility] DEPT Provider Note   CSN: [id number] Arrival date & time: 07/28/19  [id number]     History   Chief Complaint Chief Complaint  Patient presents with  . Numbness    HPI Penny [name] is a 38 y.o. female.     38yo F w/ PMH including HTN, anxiety/depression, migraines, chronic back pain who p/w tingling. Yesterday, she had a migraine so she took gabapentin and Trokendi which helped her headache. Around [place], she began having numbness in b/l hands feeling like "a hair tie was around my fingers". The numbness spread to face, head, back of neck, and legs. She felt like she was wearing a hat when she wasn't. No associated muscle weakness. She denies any loss of sensation, just a tingling feeling. It feels similar to previous episodes of anxiety attack however she usually feels heart palpitations and SOB, but she does not have these symptoms today. Furthermore, anxiety attacks usually are short in duration but these symptoms have lasted > 8 hours.   No recent illness, fevers, N/V/D, problems ambulating, or headache currently.   The history is provided by the patient.    Past Medical History:  Diagnosis Date  . ADHD (attention deficit hyperactivity disorder)   . Anxiety    no meds  . Chronic back pain   . Depression    no meds  . Essential hypertension    a. Dx 06/2016.  . Migraines    last one over 1 month ago  . Overweight   . Palpitations - Sinus Tachycardia    a. 12/2016 Echo: E f55-60%, no rwma, nl LA/RA sizes.  . Placenta previa    w/ 2 pregnancies.  . Preterm labor    c/s at 36 wks  . Scoliosis   . Sinus tachycardia     Patient Active Problem List   Diagnosis Date Noted  . Uterine fibroid 01/23/2016  . Right groin pain 01/23/2016  . History of C-section 01/23/2016  . Migraines   . Anxiety   . Depression     Past Surgical History:  Procedure Laterality Date  . CESAREAN SECTION  2010   x 1 at 36 wks in [place]  . CESAREAN  SECTION  February 13, 2012  . CHROMOPERTUBATION  10/26/2015   Procedure: ATTEMPTED CHROMOPERTUBATION;  Surgeon: Crawford Givens, MD;  Location: [facility];  Service: Gynecology;;  . LAPAROSCOPIC LYSIS OF ADHESIONS  10/26/2015   Procedure: LAPAROSCOPIC LYSIS OF ADHESIONS;  Surgeon: Crawford Givens, MD;  Location: [facility];  Service: Gynecology;;  . LAPAROSCOPY N/A 10/26/2015   Procedure: LAPAROSCOPY OPERATIVE WITH REMOVAL OF MISPLACED FILSHE CLIP;  Surgeon: Crawford Givens, MD;  Location: [facility];  Service: Gynecology;  Laterality: N/A;  . SVD  2003   x 1 in [place]  . TUBAL LIGATION    . WISDOM TOOTH EXTRACTION       OB History    Gravida  3   Para  3   Term  1   Preterm  2   AB  0   Living  3     SAB  0   TAB  0   Ectopic  0   Multiple  0   Live Births  3            Home Medications    Prior to Admission medications   Medication Sig Start Date End Date Taking? Authorizing Provider  AIMOVIG 140 MG/ML SOAJ Inject 1,140 mg into the skin every 30 (thirty)  days.  05/14/18  Yes [provider]  atomoxetine (STRATTERA) 60 MG capsule Take 60 mg by mouth daily. 08/21/18  Yes [provider]  cyclobenzaprine (FLEXERIL) 10 MG tablet Take 10 mg by mouth daily as needed for muscle spasms (pain).   Yes [provider]  DULoxetine (CYMBALTA) 30 MG capsule Take 30 mg by mouth daily. Taking with 60 mg daily to equal 90 mg   Yes [provider]  DULoxetine (CYMBALTA) 60 MG capsule Take 60 mg by mouth daily. takin g with 30 mg to equal 90 mg 06/08/17  Yes [provider]  gabapentin (NEURONTIN) 300 MG capsule Take 600 mg by mouth 3 (three) times daily as needed for headache.  01/31/19  Yes [provider]  hydrochlorothiazide (HYDRODIURIL) 12.5 MG tablet Take 12.5 mg by mouth daily.  05/28/19  Yes [provider]  hydrOXYzine (VISTARIL) 50 MG capsule Take 50 mg by mouth every 8 (eight) hours as needed for anxiety. 08/21/18  Yes [provider]  LORazepam (ATIVAN) 0.5 MG tablet Take 0.5 mg by mouth 3 (three) times daily as needed for anxiety. 08/21/18  Yes [provider]  metoprolol succinate (TOPROL-XL) 25 MG 24 hr tablet Take 25 mg by mouth daily.  03/03/17  Yes [provider]  mirtazapine (REMERON) 30 MG tablet Take 30 mg by mouth at bedtime. 08/21/18  Yes [provider]  RYVENT 6 MG TABS Take 6 mg by mouth daily. 10/06/17  Yes [provider]  Topiramate ER (TROKENDI XR) 200 MG CP24 Take 200 mg by mouth at bedtime.   Yes [provider]  Cetirizine HCl 10 MG CAPS Take 1 capsule (10 mg total) by mouth daily for 10 days. Patient not taking: Reported on 06/22/2019 02/11/19 02/21/19  Wieters, Elesa Hacker, PA-C  Dextromethorphan HBr 10 MG/5ML SYRP 10 ml qhs prn cough at night time. Patient not taking: Reported on 06/22/2019 02/04/19   Rodriguez-Southworth, Sunday Spillers, PA-C  ibuprofen (ADVIL,MOTRIN) 600 MG tablet Take 1 tablet (600 mg total) by mouth every 8 (eight) hours as needed. Patient not taking: Reported on 07/28/2019 02/11/19   Wieters, Hallie C, PA-C  methocarbamol (ROBAXIN) 500 MG tablet Take 1 tablet (500 mg total) by mouth 2 (two) times daily as needed for muscle spasms. Patient not taking: Reported on 06/22/2019 06/18/19   Blanchie Dessert, MD  ondansetron (ZOFRAN) 4 MG tablet Take 1 tablet (4 mg total) by mouth every 6 (six) hours. Patient not taking: Reported on 06/22/2019 01/25/19   Marney Setting, NP  oxyCODONE-acetaminophen (PERCOCET/ROXICET) 5-325 MG tablet Take 2 tablets by mouth every 4 (four) hours as needed for severe pain. Patient not taking: Reported on 06/22/2019 12/12/18   McDonald, Maree Erie A, PA-C  promethazine (PHENERGAN) 25 MG tablet Take 1 tablet (25 mg total) by mouth every 6 (six) hours as needed for nausea or vomiting. Patient not taking: Reported on 06/22/2019 06/18/19   Blanchie Dessert, MD  promethazine-dextromethorphan (PROMETHAZINE-DM) 6.25-15 MG/5ML syrup Take 5 mLs  by mouth at bedtime. PRN COUGH Patient not taking: Reported on 06/22/2019 02/04/19   Rodriguez-Southworth, Sunday Spillers, PA-C    Family History Family History  Problem Relation Age of Onset  . Cancer Mother   . CAD Mother   . Hypertension Sister   . Sickle cell trait Daughter   . Asthma Son   . Asthma Paternal Grandmother   . Pseudochol deficiency Neg Hx   . Malignant hyperthermia Neg Hx   . Anesthesia problems Neg Hx   .  Hypotension Neg Hx     Social History Social History   Tobacco Use  . Smoking status: Current Every Day Smoker    Packs/day: 0.10    Years: 18.00    Pack years: 1.80    Types: Cigarettes  . Smokeless tobacco: Never Used  Substance Use Topics  . Alcohol use: Yes    Comment: Rare   . Drug use: Not Currently    Types: Marijuana     Allergies   Shrimp [shellfish allergy]   Review of Systems Review of Systems All other systems reviewed and are negative except that which was mentioned in HPI   Physical Exam Updated Vital Signs BP 98/73 (BP Location: Right Arm)   Pulse 83   Temp 98.2 F (36.8 C) (Oral)   Resp 19   SpO2 97%   Physical Exam Vitals signs and nursing note reviewed.  Constitutional:      General: She is not in acute distress.    Appearance: She is well-developed.     Comments: Awake, alert  HENT:     Head: Normocephalic and atraumatic.  Eyes:     Extraocular Movements: Extraocular movements intact.     Conjunctiva/sclera: Conjunctivae normal.     Pupils: Pupils are equal, round, and reactive to light.  Neck:     Musculoskeletal: Neck supple.  Cardiovascular:     Rate and Rhythm: Normal rate and regular rhythm.     Heart sounds: Normal heart sounds. No murmur.  Pulmonary:     Effort: Pulmonary effort is normal. No respiratory distress.     Breath sounds: Normal breath sounds.  Abdominal:     General: Bowel sounds are normal. There is no distension.     Palpations: Abdomen is soft.     Tenderness: There is no abdominal  tenderness.  Skin:    General: Skin is warm and dry.  Neurological:     Mental Status: She is alert and oriented to person, place, and time.     Cranial Nerves: No cranial nerve deficit.     Motor: No abnormal muscle tone.     Deep Tendon Reflexes: Reflexes are normal and symmetric.     Comments: Fluent speech, normal finger-to-nose testing, negative pronator drift, no clonus 5/5 strength and normal sensation x all 4 extremities  Psychiatric:        Thought Content: Thought content normal.        Judgment: Judgment normal.      ED Treatments / Results  Labs (all labs ordered are listed, but only abnormal results are displayed) Labs Reviewed  BASIC METABOLIC PANEL - Abnormal; Notable for the following components:      Result Value   Potassium 5.5 (*)    CO2 21 (*)    Glucose, Bld 105 (*)    All other components within normal limits  CBC - Abnormal; Notable for the following components:   WBC 11.4 (*)    MCH 25.6 (*)    RDW 15.7 (*)    Platelets 431 (*)    All other components within normal limits  POC URINE PREG, ED    EKG None  Radiology No results found.  Procedures Procedures (including critical care time)  Medications Ordered in ED Medications  promethazine (PHENERGAN) tablet 25 mg (25 mg Oral Given 07/28/19 [id number])     Initial Impression / Assessment and Plan / ED Course  I have reviewed the triage vital signs and the nursing notes.  Pertinent labs that were available  during my care of the patient were reviewed by me and considered in my medical decision making (see chart for details).       PT neuro intact on exam. Given b/l and full body symptoms, I highly doubt stroke, MS, or other acute intracranial process. No focal symptoms to suggest radiculopathy. Checked screening labs which show normal calcium, no signs of dehydration or anemia. K was 5.5 but I suspect hemolysis as EKG was normal with no hyperkalemic changes. Pt comfortable on reassessment.  Discussed return precautions and PCP f/u if symptoms persist. She voiced understanding.   Final Clinical Impressions(s) / ED Diagnoses   Final diagnoses:  Paresthesias    ED Discharge Orders    None       Adriyana Greenbaum, Wenda Overland, MD 07/28/19 1014

## 2019-07-28 NOTE — ED Triage Notes (Addendum)
Pt BIB [facility] from home. She reports numbness to bilateral arms, legs, chest, and face. States that it started around midnight. Denies SOB or pain. Ambulatory, equal grip strengths, no facial droop or slurred speech noted.

## 2019-08-18 ENCOUNTER — Emergency Department (HOSPITAL_COMMUNITY)
Admission: EM | Admit: 2019-08-18 | Discharge: 2019-08-18 | Disposition: A | Discharge status: Home / Self Care | Payer: Medicaid Other | Attending: Emergency Medicine | Admitting: Emergency Medicine

## 2019-08-18 ENCOUNTER — Other Ambulatory Visit: Payer: Self-pay

## 2019-08-18 ENCOUNTER — Emergency Department (HOSPITAL_COMMUNITY): Payer: Medicaid Other

## 2019-08-18 ENCOUNTER — Encounter (HOSPITAL_COMMUNITY): Payer: Self-pay | Admitting: Emergency Medicine

## 2019-08-18 DIAGNOSIS — R064 Hyperventilation: Secondary | ICD-10-CM | POA: Diagnosis not present

## 2019-08-18 DIAGNOSIS — R0602 Shortness of breath: Secondary | ICD-10-CM | POA: Insufficient documentation

## 2019-08-18 DIAGNOSIS — I10 Essential (primary) hypertension: Secondary | ICD-10-CM | POA: Diagnosis not present

## 2019-08-18 DIAGNOSIS — F419 Anxiety disorder, unspecified: Secondary | ICD-10-CM | POA: Diagnosis present

## 2019-08-18 DIAGNOSIS — Z79899 Other long term (current) drug therapy: Secondary | ICD-10-CM | POA: Diagnosis not present

## 2019-08-18 DIAGNOSIS — Z91013 Allergy to seafood: Secondary | ICD-10-CM | POA: Diagnosis not present

## 2019-08-18 DIAGNOSIS — F17210 Nicotine dependence, cigarettes, uncomplicated: Secondary | ICD-10-CM | POA: Insufficient documentation

## 2019-08-18 DIAGNOSIS — R110 Nausea: Secondary | ICD-10-CM | POA: Insufficient documentation

## 2019-08-18 MED ORDER — PROMETHAZINE HCL 25 MG PO TABS
25.0000 mg | ORAL_TABLET | Freq: Once | ORAL | Status: AC
  Administered 2019-08-18: 25 mg via ORAL
  Filled 2019-08-18: qty 1

## 2019-08-18 MED ORDER — PROMETHAZINE HCL 25 MG PO TABS: 25.0000 mg | ORAL_TABLET | Freq: Four times a day (QID) | ORAL | 0 refills | Status: DC | PRN

## 2019-08-18 NOTE — ED Provider Notes (Signed)
[facility] DEPT Provider Note   CSN: [id number] Arrival date & time: 08/18/19  0345     History   Chief Complaint Chief Complaint  Patient presents with   Anxiety   Shortness of Breath    HPI Penny [name] is a 38 y.o. female with a history of anxiety, HTN, depression, chronic back pain, and concussion who presents to the emergency department by EMS with a chief complaint of hyperventilation.  The patient reports that she has been having vomiting several times per week since July 2020 accompanied by chronic nausea.  She has been seen and evaluated in the ER several times since onset of the symptoms.  She reports that she had one episode of nonbloody, nonbilious vomiting earlier in the day.  Tonight, the patient had an episode of coughing.  The episode occurred while she was sitting and drinking ginger ale.  She states that she coughed so intensely that she vomited and continued to cough to the point where she felt as if she was choking.  After this incident, she was unable to calm herself down and began hyperventilating.  She reports that the hyperventilation persisted until she was in the ER, but has since resolved.  She has no complaints at this time other than nausea.  She reports that she has been eating and drinking well.  No fevers, chills, shortness of breath, chest pain, back pain, palpitations, leg swelling, abdominal pain, diarrhea, constipation, visual changes, or headache.  She does have a history of anxiety and panic attacks.  She states that when she typically has these episodes that in addition to having hyperventilation that she will also typically have tingling in her hands and her feet, which she did not have tonight.  No treatment prior to arrival.       The history is provided by the patient. No language interpreter was used.  Shortness of Breath Associated symptoms: cough and vomiting   Associated symptoms: no abdominal  pain, no chest pain, no fever, no headaches, no rash and no wheezing     Past Medical History:  Diagnosis Date   ADHD (attention deficit hyperactivity disorder)    Anxiety    no meds   Chronic back pain    Depression    no meds   Essential hypertension    a. Dx 06/2016.   Migraines    last one over 1 month ago   Overweight    Palpitations - Sinus Tachycardia    a. 12/2016 Echo: E f55-60%, no rwma, nl LA/RA sizes.   Placenta previa    w/ 2 pregnancies.   Preterm labor    c/s at 36 wks   Scoliosis    Sinus tachycardia     Patient Active Problem List   Diagnosis Date Noted   Uterine fibroid 01/23/2016   Right groin pain 01/23/2016   History of C-section 01/23/2016   Migraines    Anxiety    Depression     Past Surgical History:  Procedure Laterality Date   CESAREAN SECTION  2010   x 1 at 36 wks in [place]   CESAREAN SECTION  February 13, 2012   CHROMOPERTUBATION  10/26/2015   Procedure: ATTEMPTED CHROMOPERTUBATION;  Surgeon: Crawford Givens, MD;  Location: [facility];  Service: Gynecology;;   LAPAROSCOPIC LYSIS OF ADHESIONS  10/26/2015   Procedure: LAPAROSCOPIC LYSIS OF ADHESIONS;  Surgeon: Crawford Givens, MD;  Location: [facility];  Service: Gynecology;;   LAPAROSCOPY N/A 10/26/2015   Procedure:  LAPAROSCOPY OPERATIVE WITH REMOVAL OF MISPLACED FILSHE CLIP;  Surgeon: Crawford Givens, MD;  Location: [facility];  Service: Gynecology;  Laterality: N/A;   SVD  2003   x 1 in [place]   TUBAL LIGATION     WISDOM TOOTH EXTRACTION       OB History    Gravida  3   Para  3   Term  1   Preterm  2   AB  0   Living  3     SAB  0   TAB  0   Ectopic  0   Multiple  0   Live Births  3            Home Medications    Prior to Admission medications   Medication Sig Start Date End Date Taking? Authorizing Provider  AIMOVIG 140 MG/ML SOAJ Inject 1,140 mg into the skin every 30 (thirty) days.  05/14/18  Yes [provider]  atomoxetine (STRATTERA) 80  MG capsule Take 80 mg by mouth daily.    Yes [provider]  cyclobenzaprine (FLEXERIL) 10 MG tablet Take 10 mg by mouth daily as needed for muscle spasms (pain).   Yes [provider]  DULoxetine (CYMBALTA) 30 MG capsule Take 30 mg by mouth daily. Pt takes with the cymbalta 60mg  for a total of 90 mg   Yes [provider]  DULoxetine (CYMBALTA) 60 MG capsule Take 60 mg by mouth daily. Pt takes with cymbalta 30 mg for a total of 90 mg   Yes [provider]  gabapentin (NEURONTIN) 300 MG capsule Take 600 mg by mouth 3 (three) times daily as needed for headache.  01/31/19  Yes [provider]  hydrochlorothiazide (HYDRODIURIL) 12.5 MG tablet Take 12.5 mg by mouth daily.  05/28/19  Yes [provider]  hydrOXYzine (VISTARIL) 50 MG capsule Take 50 mg by mouth every 8 (eight) hours as needed for anxiety. 08/21/18  Yes [provider]  ibuprofen (ADVIL) 800 MG tablet Take 800 mg by mouth every 8 (eight) hours as needed for moderate pain.   Yes [provider]  metoprolol succinate (TOPROL-XL) 25 MG 24 hr tablet Take 25 mg by mouth daily.  03/03/17  Yes [provider]  mirtazapine (REMERON) 30 MG tablet Take 30 mg by mouth at bedtime. 08/21/18  Yes [provider]  Topiramate ER (TROKENDI XR) 200 MG CP24 Take 200 mg by mouth at bedtime.   Yes [provider]  promethazine (PHENERGAN) 25 MG tablet Take 1 tablet (25 mg total) by mouth every 6 (six) hours as needed for nausea or vomiting. 08/18/19   Thelonious Kauffmann A, PA-C  promethazine-dextromethorphan (PROMETHAZINE-DM) 6.25-15 MG/5ML syrup Take 5 mLs by mouth at bedtime. PRN COUGH Patient not taking: Reported on 06/22/2019 02/04/19   Rodriguez-Southworth, Sunday Spillers, PA-C  Cetirizine HCl 10 MG CAPS Take 1 capsule (10 mg total) by mouth daily for 10 days. Patient not taking: Reported on 06/22/2019 02/11/19 08/18/19  Wieters, Elesa Hacker, PA-C  Dextromethorphan HBr 10 MG/5ML SYRP 10  ml qhs prn cough at night time. Patient not taking: Reported on 06/22/2019 02/04/19 08/18/19  Rodriguez-Southworth, Sunday Spillers, PA-C    Family History Family History  Problem Relation Age of Onset   Cancer Mother    CAD Mother    Hypertension Sister    Sickle cell trait Daughter    Asthma Son    Asthma Paternal Grandmother    Pseudochol deficiency Neg Hx    Malignant hyperthermia  Neg Hx    Anesthesia problems Neg Hx    Hypotension Neg Hx     Social History Social History   Tobacco Use   Smoking status: Current Every Day Smoker    Packs/day: 0.10    Years: 18.00    Pack years: 1.80    Types: Cigarettes   Smokeless tobacco: Never Used  Substance Use Topics   Alcohol use: Yes    Comment: Rare    Drug use: Not Currently    Types: Marijuana     Allergies   Shrimp [shellfish allergy]   Review of Systems Review of Systems  Constitutional: Negative for activity change, chills and fever.  Eyes: Negative for visual disturbance.  Respiratory: Positive for cough and shortness of breath. Negative for choking, chest tightness and wheezing.   Cardiovascular: Negative for chest pain, palpitations and leg swelling.  Gastrointestinal: Positive for nausea and vomiting. Negative for abdominal pain, anal bleeding, blood in stool, constipation and diarrhea.  Genitourinary: Negative for dysuria.  Musculoskeletal: Negative for back pain.  Skin: Negative for rash.  Allergic/Immunologic: Negative for immunocompromised state.  Neurological: Negative for dizziness, syncope, speech difficulty, weakness, numbness and headaches.  Psychiatric/Behavioral: Negative for agitation, confusion, decreased concentration, dysphoric mood, hallucinations, sleep disturbance and suicidal ideas. The patient is nervous/anxious.      Physical Exam Updated Vital Signs BP 128/86    Pulse 91    Temp 98.4 F (36.9 C) (Oral)    Resp 18    Wt 87.1 kg    SpO2 99%    BMI 32.96 kg/m   Physical  Exam Vitals signs and nursing note reviewed.  Constitutional:      General: She is not in acute distress.    Appearance: She is not ill-appearing, toxic-appearing or diaphoretic.  HENT:     Head: Normocephalic.     Mouth/Throat:     Mouth: Mucous membranes are moist.     Pharynx: No oropharyngeal exudate or posterior oropharyngeal erythema.     Comments: Mucous membranes are moist. Eyes:     Extraocular Movements: Extraocular movements intact.     Conjunctiva/sclera: Conjunctivae normal.     Pupils: Pupils are equal, round, and reactive to light.  Neck:     Musculoskeletal: Neck supple.  Cardiovascular:     Rate and Rhythm: Normal rate and regular rhythm.     Pulses: Normal pulses.     Heart sounds: Normal heart sounds. No murmur. No friction rub. No gallop.   Pulmonary:     Effort: Pulmonary effort is normal. No respiratory distress.     Breath sounds: Normal breath sounds. No stridor. No wheezing, rhonchi or rales.     Comments: Lungs are clear and equal in all fields.  Good air movement.  No accessory muscle use or retractions.  No respiratory distress.  Speaks in complete, fluent sentences without difficulty. Chest:     Chest wall: No tenderness.  Abdominal:     General: There is no distension.     Palpations: Abdomen is soft. There is no mass.     Tenderness: There is no abdominal tenderness. There is no right CVA tenderness, left CVA tenderness, guarding or rebound.     Hernia: No hernia is present.  Musculoskeletal:     Right lower leg: No edema.     Left lower leg: No edema.  Skin:    General: Skin is warm.     Capillary Refill: Capillary refill takes less than 2 seconds.  Coloration: Skin is not pale.     Findings: No erythema or rash.     Comments: Good skin turgor.  Neurological:     Mental Status: She is alert.  Psychiatric:        Behavior: Behavior normal.      ED Treatments / Results  Labs (all labs ordered are listed, but only abnormal results are  displayed) Labs Reviewed - No data to display  EKG None  Radiology Dg Chest 2 View  Result Date: 08/18/2019 CLINICAL DATA:  Shortness of breath EXAM: CHEST - 2 VIEW COMPARISON:  12/12/2018 FINDINGS: The heart size and mediastinal contours are within normal limits. Both lungs are clear. The visualized skeletal structures are unremarkable. IMPRESSION: No active cardiopulmonary disease. Electronically Signed   By: Kathreen Devoid   On: 08/18/2019 06:21    Procedures Procedures (including critical care time)  Medications Ordered in ED Medications  promethazine (PHENERGAN) tablet 25 mg (25 mg Oral Given 08/18/19 0556)     Initial Impression / Assessment and Plan / ED Course  I have reviewed the triage vital signs and the nursing notes.  Pertinent labs & imaging results that were available during my care of the patient were reviewed by me and considered in my medical decision making (see chart for details).       39 year old female with a history of anxiety, HTN, depression, chronic back pain, and concussion presenting by EMS after an episode of hyperventilation.  Per the patient, she had a coughing episode that triggered an episode of posttussive emesis after which she felt as if she continued to cough or have a choking episode and then subsequently developed hyperventilation.  No treatment PTA.  She does have a history of anxiety and panic attacks, but states this felt different.  She was minimally tachycardic on arrival, but had no tachypnea or hypoxia.  On my evaluation, her symptoms have resolved and she is sitting comfortably speaking in complete fluent sentences without increased work of breathing.  Lungs are clear and she has good air movement with clear lungs in all fields.  Her only complaint is nausea.  Shared decision making conversation.  The patient does report that she has been having frequent vomiting for the last few months.  She states this happens on average of 3-4 times per  week, but does endorse 2 episodes today.  She was initially tachycardic on arrival, which could be attributed to her hyperventilation, but tachycardia has resolved without treatment.  She has good skin turgor and does not clinically look dehydrated.  Given concern for vomiting, offered to check labs to make sure there were no metabolic derangements.  Patient reports that she is tired and would like to decline.  I did review her labs in the ER from 2 weeks ago, which did not have any significant electrolyte abnormalities.  Given her episode tonight, I do think that it is reasonable to check a chest x-ray given questionable choking episode.  Chest x-ray is unremarkable, specifically for aspiration pneumonia.  She has been tolerating fluids by mouth without difficulty since arrival in the ER without choking episodes or vomiting.  She has no chest pain at this time.  She is requesting Phenergan for nausea, which has been given.  The patient was also ambulated on pulse ox in the department and maintained SaO2 at 100%.  She states that she is tired and would like to go home.  She would like to follow up with her PCP, which I  encouraged given ongoing nausea and vomiting.  She has no concerns for pregnancy at this time.  I think this is reasonable given symptom resolution.   Final Clinical Impressions(s) / ED Diagnoses   Final diagnoses:  Acute hyperventilation  Chronic nausea    ED Discharge Orders         Ordered    promethazine (PHENERGAN) 25 MG tablet  Every 6 hours PRN     08/18/19 0637           Joline Maxcy A, PA-C 08/18/19 0831    [name], MD 08/21/19 1035

## 2019-08-18 NOTE — ED Triage Notes (Signed)
Pt presents by [facility] for evaluation of anxiety and previous episode of vomiting none during transport. Pt speaking in short phases and breathing through mouth.

## 2019-08-18 NOTE — Discharge Instructions (Signed)
Thank you for allowing me to care for you today in the Emergency Department.   Your chest x-ray was normal today.  You can take 1 tablet of Phenergan every 6 hours as needed for nausea or vomiting.  If you continue to have frequent vomiting and nausea, please follow-up with primary care for a re-evaluation.  Return to the emergency department if you develop respiratory distress, persistent vomiting, if you stop making urine, if you develop vomiting with severe abdominal pain and high fevers, or other new, concerning symptoms.

## 2019-08-18 NOTE — ED Notes (Signed)
Patients o2 saturation remained at 100% while walking to end of hall and back to room (from room 25 to rm 18 and back). Provider made aware.

## 2019-09-10 ENCOUNTER — Encounter (HOSPITAL_COMMUNITY): Payer: Self-pay | Admitting: Emergency Medicine

## 2019-09-10 ENCOUNTER — Other Ambulatory Visit: Payer: Self-pay

## 2019-09-10 ENCOUNTER — Emergency Department (HOSPITAL_COMMUNITY)
Admission: EM | Admit: 2019-09-10 | Discharge: 2019-09-10 | Disposition: A | Discharge status: Home / Self Care | Payer: Medicaid Other | Attending: Emergency Medicine | Admitting: Emergency Medicine

## 2019-09-10 DIAGNOSIS — F17210 Nicotine dependence, cigarettes, uncomplicated: Secondary | ICD-10-CM | POA: Insufficient documentation

## 2019-09-10 DIAGNOSIS — I10 Essential (primary) hypertension: Secondary | ICD-10-CM | POA: Insufficient documentation

## 2019-09-10 DIAGNOSIS — G44209 Tension-type headache, unspecified, not intractable: Secondary | ICD-10-CM | POA: Insufficient documentation

## 2019-09-10 DIAGNOSIS — Z79899 Other long term (current) drug therapy: Secondary | ICD-10-CM | POA: Diagnosis not present

## 2019-09-10 DIAGNOSIS — R519 Headache, unspecified: Secondary | ICD-10-CM | POA: Diagnosis present

## 2019-09-10 MED ORDER — SODIUM CHLORIDE 0.9 % IV BOLUS
1000.0000 mL | Freq: Once | INTRAVENOUS | Status: AC
  Administered 2019-09-10: 1000 mL via INTRAVENOUS

## 2019-09-10 MED ORDER — KETOROLAC TROMETHAMINE 15 MG/ML IJ SOLN: 15.0000 mg | Freq: Once | INTRAMUSCULAR | Status: DC

## 2019-09-10 MED ORDER — DIPHENHYDRAMINE HCL 50 MG/ML IJ SOLN
25.0000 mg | Freq: Once | INTRAMUSCULAR | Status: AC
  Administered 2019-09-10: 25 mg via INTRAVENOUS
  Filled 2019-09-10: qty 1

## 2019-09-10 MED ORDER — BUTALBITAL-APAP-CAFFEINE 50-325-40 MG PO TABS: 1.0000 | ORAL_TABLET | Freq: Four times a day (QID) | ORAL | 0 refills | Status: AC | PRN

## 2019-09-10 MED ORDER — ACETAMINOPHEN 325 MG PO TABS
650.0000 mg | ORAL_TABLET | Freq: Once | ORAL | Status: AC
  Administered 2019-09-10: 650 mg via ORAL
  Filled 2019-09-10: qty 2

## 2019-09-10 MED ORDER — PROCHLORPERAZINE EDISYLATE 10 MG/2ML IJ SOLN
10.0000 mg | Freq: Once | INTRAMUSCULAR | Status: AC
  Administered 2019-09-10: 10 mg via INTRAVENOUS
  Filled 2019-09-10: qty 2

## 2019-09-10 NOTE — Discharge Instructions (Signed)
Recommend taking the prescribed Fioricet as needed for further headaches.  Please note this medicine can make you drowsy and cannot be taken while driving or operating heavy machinery.  If you have recurrence of severe headache, vomiting, numbness, weakness, vision changes, please return to ER for recheck.  Otherwise recommend following up with primary doctor for recheck later this week.

## 2019-09-10 NOTE — ED Provider Notes (Signed)
[facility] DEPT Provider Note   CSN: [id number] Arrival date & time: 09/10/19  0754     History   Chief Complaint Chief Complaint  Patient presents with  . Migraine    HPI Penny [name] is a 38 y.o. female.  Presents emerged from chief complaint headache.  Patient states that she woke up with headache around 4 AM, initially moderate, has been steadily progressing, now severe, worse and fracture of her head.  Worse with bright lights.  Has had associated nausea, tried taking 1 Phenergan which seemed to improve her symptoms but has since seemed to fade away.  No vomiting, no abdominal pain, no chest pain or difficulty breathing.  She has not had any associated vision changes, no numbness, weakness, gait changes.  She has prior history of migraines, states similar to prior migraines though most of the time her migraines are in the back of her head.  No fevers, no neck pain.     HPI  Past Medical History:  Diagnosis Date  . ADHD (attention deficit hyperactivity disorder)   . Anxiety    no meds  . Chronic back pain   . Depression    no meds  . Essential hypertension    a. Dx 06/2016.  . Migraines    last one over 1 month ago  . Overweight   . Palpitations - Sinus Tachycardia    a. 12/2016 Echo: E f55-60%, no rwma, nl LA/RA sizes.  . Placenta previa    w/ 2 pregnancies.  . Preterm labor    c/s at 36 wks  . Scoliosis   . Sinus tachycardia     Patient Active Problem List   Diagnosis Date Noted  . Uterine fibroid 01/23/2016  . Right groin pain 01/23/2016  . History of C-section 01/23/2016  . Migraines   . Anxiety   . Depression     Past Surgical History:  Procedure Laterality Date  . CESAREAN SECTION  2010   x 1 at 36 wks in [place]  . CESAREAN SECTION  February 13, 2012  . CHROMOPERTUBATION  10/26/2015   Procedure: ATTEMPTED CHROMOPERTUBATION;  Surgeon: Crawford Givens, MD;  Location: [facility];  Service: Gynecology;;  . LAPAROSCOPIC  LYSIS OF ADHESIONS  10/26/2015   Procedure: LAPAROSCOPIC LYSIS OF ADHESIONS;  Surgeon: Crawford Givens, MD;  Location: [facility];  Service: Gynecology;;  . LAPAROSCOPY N/A 10/26/2015   Procedure: LAPAROSCOPY OPERATIVE WITH REMOVAL OF MISPLACED FILSHE CLIP;  Surgeon: Crawford Givens, MD;  Location: [facility];  Service: Gynecology;  Laterality: N/A;  . SVD  2003   x 1 in [place]  . TUBAL LIGATION    . WISDOM TOOTH EXTRACTION       OB History    Gravida  3   Para  3   Term  1   Preterm  2   AB  0   Living  3     SAB  0   TAB  0   Ectopic  0   Multiple  0   Live Births  3            Home Medications    Prior to Admission medications   Medication Sig Start Date End Date Taking? Authorizing Provider  AIMOVIG 140 MG/ML SOAJ Inject 1,140 mg into the skin every 30 (thirty) days.  05/14/18   [provider]  atomoxetine (STRATTERA) 80 MG capsule Take 80 mg by mouth daily.     [provider]  cyclobenzaprine (FLEXERIL) 10 MG tablet Take 10 mg by mouth daily as needed for muscle spasms (pain).    [provider]  DULoxetine (CYMBALTA) 30 MG capsule Take 30 mg by mouth daily. Pt takes with the cymbalta 60mg  for a total of 90 mg    [provider]  DULoxetine (CYMBALTA) 60 MG capsule Take 60 mg by mouth daily. Pt takes with cymbalta 30 mg for a total of 90 mg    [provider]  gabapentin (NEURONTIN) 300 MG capsule Take 600 mg by mouth 3 (three) times daily as needed for headache.  01/31/19   [provider]  hydrochlorothiazide (HYDRODIURIL) 12.5 MG tablet Take 12.5 mg by mouth daily.  05/28/19   [provider]  hydrOXYzine (VISTARIL) 50 MG capsule Take 50 mg by mouth every 8 (eight) hours as needed for anxiety. 08/21/18   [provider]  ibuprofen (ADVIL) 800 MG tablet Take 800 mg by mouth every 8 (eight) hours as needed for moderate pain.    [provider]  metoprolol succinate (TOPROL-XL) 25 MG 24 hr  tablet Take 25 mg by mouth daily.  03/03/17   [provider]  mirtazapine (REMERON) 30 MG tablet Take 30 mg by mouth at bedtime. 08/21/18   [provider]  promethazine (PHENERGAN) 25 MG tablet Take 1 tablet (25 mg total) by mouth every 6 (six) hours as needed for nausea or vomiting. 08/18/19   McDonald, [name], PA-C  promethazine-dextromethorphan (PROMETHAZINE-DM) 6.25-15 MG/5ML syrup Take 5 mLs by mouth at bedtime. PRN COUGH Patient not taking: Reported on 06/22/2019 02/04/19   Rodriguez-Southworth, Sunday Spillers, PA-C  Topiramate ER (TROKENDI XR) 200 MG CP24 Take 200 mg by mouth at bedtime.    [provider]  Cetirizine HCl 10 MG CAPS Take 1 capsule (10 mg total) by mouth daily for 10 days. Patient not taking: Reported on 06/22/2019 02/11/19 08/18/19  Wieters, Elesa Hacker, PA-C  Dextromethorphan HBr 10 MG/5ML SYRP 10 ml qhs prn cough at night time. Patient not taking: Reported on 06/22/2019 02/04/19 08/18/19  Rodriguez-Southworth, Sunday Spillers, PA-C    Family History Family History  Problem Relation Age of Onset  . Cancer Mother   . CAD Mother   . Hypertension Sister   . Sickle cell trait Daughter   . Asthma Son   . Asthma Paternal Grandmother   . Pseudochol deficiency Neg Hx   . Malignant hyperthermia Neg Hx   . Anesthesia problems Neg Hx   . Hypotension Neg Hx     Social History Social History   Tobacco Use  . Smoking status: Current Every Day Smoker    Packs/day: 0.10    Years: 18.00    Pack years: 1.80    Types: Cigarettes  . Smokeless tobacco: Never Used  Substance Use Topics  . Alcohol use: Yes    Comment: Rare   . Drug use: Not Currently    Types: Marijuana     Allergies   Shrimp [shellfish allergy]   Review of Systems Review of Systems  Constitutional: Negative for chills and fever.  HENT: Negative for ear pain and sore throat.   Eyes: Negative for pain and visual disturbance.  Respiratory: Negative for cough and shortness of breath.   Cardiovascular:  Negative for chest pain and palpitations.  Gastrointestinal: Negative for abdominal pain and vomiting.  Genitourinary: Negative for dysuria and hematuria.  Musculoskeletal: Negative for arthralgias and back pain.  Skin: Negative for color change and rash.  Neurological: Positive for headaches.  Negative for seizures and syncope.  All other systems reviewed and are negative.    Physical Exam Updated Vital Signs BP (!) 123/94 (BP Location: Left Arm)   Pulse (!) 110   Temp 98.1 F (36.7 C) (Oral)   Resp 17   LMP 08/30/2019   SpO2 98%   Physical Exam Vitals signs and nursing note reviewed.  Constitutional:      General: She is not in acute distress.    Appearance: She is well-developed.  HENT:     Head: Normocephalic and atraumatic.     Mouth/Throat:     Mouth: Mucous membranes are moist.     Pharynx: Oropharynx is clear.  Eyes:     Conjunctiva/sclera: Conjunctivae normal.  Neck:     Musculoskeletal: Neck supple.  Cardiovascular:     Rate and Rhythm: Normal rate and regular rhythm.     Heart sounds: No murmur.  Pulmonary:     Effort: Pulmonary effort is normal. No respiratory distress.     Breath sounds: Normal breath sounds.  Abdominal:     Palpations: Abdomen is soft.     Tenderness: There is no abdominal tenderness.  Musculoskeletal:        General: No swelling or tenderness.  Skin:    General: Skin is warm and dry.     Capillary Refill: Capillary refill takes less than 2 seconds.  Neurological:     General: No focal deficit present.     Mental Status: She is alert and oriented to person, place, and time.     Cranial Nerves: No cranial nerve deficit.     Sensory: No sensory deficit.     Motor: No weakness.     Coordination: Coordination normal.     Gait: Gait normal.  Psychiatric:        Mood and Affect: Mood normal.      ED Treatments / Results  Labs (all labs ordered are listed, but only abnormal results are displayed) Labs Reviewed - No data to  display  EKG None  Radiology No results found.  Procedures Procedures (including critical care time)  Medications Ordered in ED Medications - No data to display   Initial Impression / Assessment and Plan / ED Course  I have reviewed the triage vital signs and the nursing notes.  Pertinent labs & imaging results that were available during my care of the patient were reviewed by me and considered in my medical decision making (see chart for details).  Clinical Course as of Sep 09 1033  Tue Sep 10, 2019  0930 Completed initial assessment, will give headache cocktail and reassess   [RD]  1032 Headache now near completely resolved, 1 out of 10, will discharge home   [RD]    Clinical Course User Index [RD] Lucrezia Starch, MD       38 year old lady presents to ER with headache.  Patient has long history of migraines, similar to prior today.  Not sudden onset.  Normal neurologic exam, no focal neuro complaints.  Do not feel emergent head imaging needed at this time.  Gave single headache cocktail, had near complete resolution of her headache.  Believe she is appropriate for discharge and outpatient management this time.  Will give short Rx for Fioricet and recommend recheck with her primary doctor later this week.    After the discussed management above, the patient was determined to be safe for discharge.  The patient was in agreement with this plan and all questions regarding  their care were answered.  ED return precautions were discussed and the patient will return to the ED with any significant worsening of condition.    Final Clinical Impressions(s) / ED Diagnoses   Final diagnoses:  Tension headache    ED Discharge Orders    None       Lucrezia Starch, MD 09/10/19 1036

## 2019-09-10 NOTE — ED Notes (Signed)
Before RN gave medication, RN prefaced the administration by explaining to patient that there may be a burning sensation, even though RN was giving medication slowly and that this was a normal reaction and would fade. Patient reported understanding. Patient reported the medication was burning above the site of the IV. No noted swelling, red streaking, or hives noted following administration

## 2019-09-10 NOTE — ED Notes (Signed)
IV site assessed by RN. No noted swelling or discoloration. IV flowing without difficulty. Patient reports it "hurts too much and wants it out" Hassell Done, [place] removed IV and MD made aware.

## 2019-09-10 NOTE — ED Triage Notes (Signed)
Pt c/o frontal headache since around 4am. Hx migraines.

## 2019-12-30 ENCOUNTER — Other Ambulatory Visit: Payer: Self-pay

## 2019-12-30 ENCOUNTER — Encounter (HOSPITAL_COMMUNITY): Payer: Self-pay | Admitting: Emergency Medicine

## 2019-12-30 ENCOUNTER — Emergency Department (HOSPITAL_COMMUNITY)
Admission: EM | Admit: 2019-12-30 | Discharge: 2019-12-30 | Disposition: A | Discharge status: Home / Self Care | Payer: Medicaid Other | Attending: Emergency Medicine | Admitting: Emergency Medicine

## 2019-12-30 ENCOUNTER — Emergency Department (HOSPITAL_COMMUNITY): Payer: Medicaid Other

## 2019-12-30 DIAGNOSIS — I10 Essential (primary) hypertension: Secondary | ICD-10-CM | POA: Insufficient documentation

## 2019-12-30 DIAGNOSIS — Y939 Activity, unspecified: Secondary | ICD-10-CM | POA: Diagnosis not present

## 2019-12-30 DIAGNOSIS — F909 Attention-deficit hyperactivity disorder, unspecified type: Secondary | ICD-10-CM | POA: Insufficient documentation

## 2019-12-30 DIAGNOSIS — Z79899 Other long term (current) drug therapy: Secondary | ICD-10-CM | POA: Diagnosis not present

## 2019-12-30 DIAGNOSIS — Y929 Unspecified place or not applicable: Secondary | ICD-10-CM | POA: Insufficient documentation

## 2019-12-30 DIAGNOSIS — F17210 Nicotine dependence, cigarettes, uncomplicated: Secondary | ICD-10-CM | POA: Diagnosis not present

## 2019-12-30 DIAGNOSIS — Y999 Unspecified external cause status: Secondary | ICD-10-CM | POA: Insufficient documentation

## 2019-12-30 DIAGNOSIS — W228XXA Striking against or struck by other objects, initial encounter: Secondary | ICD-10-CM | POA: Diagnosis not present

## 2019-12-30 DIAGNOSIS — S90122A Contusion of left lesser toe(s) without damage to nail, initial encounter: Secondary | ICD-10-CM | POA: Insufficient documentation

## 2019-12-30 DIAGNOSIS — S99922A Unspecified injury of left foot, initial encounter: Secondary | ICD-10-CM | POA: Diagnosis present

## 2019-12-30 NOTE — Discharge Instructions (Addendum)
Please buddy tape the injured toe as needed.  You will likely need to do this for about 3 weeks.  Wear the postop shoe as needed.  Apply ice to the injured toe for 30 minutes at a time, 4 times a day.  Keep it elevated as much as possible.  Take ibuprofen or naproxen as needed for pain.  You may add acetaminophen to get additional pain relief.

## 2019-12-30 NOTE — ED Notes (Signed)
Patients left 4th toe buddy taped to 3rd toe with silk tape. Post op shoe applied. Pt able to ambulate to restroom without difficulty and states her toe feels better now that its taped.

## 2019-12-30 NOTE — ED Triage Notes (Signed)
Pt reports unable to sleep, walking around and stubbed her left 4th toe on her walker. Pt able to ambulate. Some redness and welling noted by EMS.

## 2019-12-30 NOTE — ED Notes (Signed)
X-ray at bedside

## 2019-12-30 NOTE — ED Provider Notes (Signed)
[facility] DEPT Provider Note   CSN: [id number] Arrival date & time: 12/30/19  0422   History Chief Complaint  Patient presents with  . Toe Injury    left 4th toe    Penny [name] is a 39 y.o. female.  The history is provided by the patient.  She has history of hypertension and comes in after injuring her left fourth toe.  She stubbed it against her walker.  She is complaining of severe pain in that toe and has noted that the toe is bruised and swollen.  She rates pain at 10/10.  She denies other injury.  Past Medical History:  Diagnosis Date  . ADHD (attention deficit hyperactivity disorder)   . Anxiety    no meds  . Chronic back pain   . Depression    no meds  . Essential hypertension    a. Dx 06/2016.  . Migraines    last one over 1 month ago  . Overweight   . Palpitations - Sinus Tachycardia    a. 12/2016 Echo: E f55-60%, no rwma, nl LA/RA sizes.  . Placenta previa    w/ 2 pregnancies.  . Preterm labor    c/s at 36 wks  . Scoliosis   . Sinus tachycardia     Patient Active Problem List   Diagnosis Date Noted  . Uterine fibroid 01/23/2016  . Right groin pain 01/23/2016  . History of C-section 01/23/2016  . Migraines   . Anxiety   . Depression     Past Surgical History:  Procedure Laterality Date  . CESAREAN SECTION  2010   x 1 at 36 wks in [place]  . CESAREAN SECTION  February 13, 2012  . CHROMOPERTUBATION  10/26/2015   Procedure: ATTEMPTED CHROMOPERTUBATION;  Surgeon: Crawford Givens, MD;  Location: [facility];  Service: Gynecology;;  . LAPAROSCOPIC LYSIS OF ADHESIONS  10/26/2015   Procedure: LAPAROSCOPIC LYSIS OF ADHESIONS;  Surgeon: Crawford Givens, MD;  Location: [facility];  Service: Gynecology;;  . LAPAROSCOPY N/A 10/26/2015   Procedure: LAPAROSCOPY OPERATIVE WITH REMOVAL OF MISPLACED FILSHE CLIP;  Surgeon: Crawford Givens, MD;  Location: [facility];  Service: Gynecology;  Laterality: N/A;  . SVD  2003   x 1 in [place]  . TUBAL  LIGATION    . WISDOM TOOTH EXTRACTION       OB History    Gravida  3   Para  3   Term  1   Preterm  2   AB  0   Living  3     SAB  0   TAB  0   Ectopic  0   Multiple  0   Live Births  3           Family History  Problem Relation Age of Onset  . Cancer Mother   . CAD Mother   . Hypertension Sister   . Sickle cell trait Daughter   . Asthma Son   . Asthma Paternal Grandmother   . Pseudochol deficiency Neg Hx   . Malignant hyperthermia Neg Hx   . Anesthesia problems Neg Hx   . Hypotension Neg Hx     Social History   Tobacco Use  . Smoking status: Current Every Day Smoker    Packs/day: 0.10    Years: 18.00    Pack years: 1.80    Types: Cigarettes  . Smokeless tobacco: Never Used  Substance Use Topics  . Alcohol use: Yes    Comment:  Rare   . Drug use: Not Currently    Types: Marijuana    Home Medications Prior to Admission medications   Medication Sig Start Date End Date Taking? Authorizing Provider  AIMOVIG 140 MG/ML SOAJ Inject 1,140 mg into the skin every 30 (thirty) days.  05/14/18   [provider]  atomoxetine (STRATTERA) 80 MG capsule Take 80 mg by mouth daily.     [provider]  butalbital-acetaminophen-caffeine (FIORICET) 50-325-40 MG tablet Take 1-2 tablets by mouth every 6 (six) hours as needed for headache. 09/10/19 09/09/20  Lucrezia Starch, MD  cyclobenzaprine (FLEXERIL) 10 MG tablet Take 10 mg by mouth daily as needed for muscle spasms (pain).    [provider]  DULoxetine (CYMBALTA) 30 MG capsule Take 30 mg by mouth daily. Pt takes with the cymbalta 60mg  for a total of 90 mg    [provider]  DULoxetine (CYMBALTA) 60 MG capsule Take 60 mg by mouth daily. Pt takes with cymbalta 30 mg for a total of 90 mg    [provider]  gabapentin (NEURONTIN) 300 MG capsule Take 600 mg by mouth 3 (three) times daily as needed for headache.  01/31/19   [provider]  hydrochlorothiazide  (HYDRODIURIL) 12.5 MG tablet Take 12.5 mg by mouth daily.  05/28/19   [provider]  hydrOXYzine (VISTARIL) 50 MG capsule Take 50 mg by mouth every 8 (eight) hours as needed for anxiety. 08/21/18   [provider]  ibuprofen (ADVIL) 800 MG tablet Take 800 mg by mouth every 8 (eight) hours as needed for moderate pain.    [provider]  metoprolol succinate (TOPROL-XL) 25 MG 24 hr tablet Take 25 mg by mouth daily.  03/03/17   [provider]  mirtazapine (REMERON) 30 MG tablet Take 30 mg by mouth at bedtime. 08/21/18   [provider]  promethazine (PHENERGAN) 25 MG tablet Take 1 tablet (25 mg total) by mouth every 6 (six) hours as needed for nausea or vomiting. 08/18/19   McDonald, [name], PA-C  promethazine-dextromethorphan (PROMETHAZINE-DM) 6.25-15 MG/5ML syrup Take 5 mLs by mouth at bedtime. PRN COUGH Patient not taking: Reported on 06/22/2019 02/04/19   Rodriguez-Southworth, Sunday Spillers, PA-C  Topiramate ER (TROKENDI XR) 200 MG CP24 Take 200 mg by mouth at bedtime.    [provider]  Cetirizine HCl 10 MG CAPS Take 1 capsule (10 mg total) by mouth daily for 10 days. Patient not taking: Reported on 06/22/2019 02/11/19 08/18/19  Wieters, Elesa Hacker, PA-C  Dextromethorphan HBr 10 MG/5ML SYRP 10 ml qhs prn cough at night time. Patient not taking: Reported on 06/22/2019 02/04/19 08/18/19  Rodriguez-Southworth, Sunday Spillers, PA-C    Allergies    Shrimp [shellfish allergy]  Review of Systems   Review of Systems  All other systems reviewed and are negative.   Physical Exam Updated Vital Signs BP 124/84 (BP Location: Left Arm)   Pulse (!) 112   Temp 99.3 F (37.4 C) (Oral)   Resp 18   SpO2 98%   Physical Exam Vitals and nursing note reviewed.   39 year old female, resting comfortably and in no acute distress. Vital signs are significant for mildly elevated heart rate. Oxygen saturation is 98%, which is normal. Head is normocephalic and atraumatic. PERRLA,  EOMI. Oropharynx is clear. Neck is nontender and supple without adenopathy or JVD. Back is nontender and there is no CVA tenderness. Lungs are clear without rales, wheezes, or rhonchi. Chest is nontender. Heart has regular  rate and rhythm without murmur. Abdomen is soft, flat, nontender without masses or hepatosplenomegaly and peristalsis is normoactive. Extremities: Ecchymosis and mild swelling of the left fourth toe with tenderness rather diffusely.  No other foot injury is identified. Skin is warm and dry without rash. Neurologic: Mental status is normal, cranial nerves are intact, there are no motor or sensory deficits.  ED Results / Procedures / Treatments    Radiology DG Foot Complete Left  Result Date: 12/30/2019 CLINICAL DATA:  Left fourth toe injury EXAM: LEFT FOOT - COMPLETE 3+ VIEW COMPARISON:  None. FINDINGS: There is no evidence of fracture or dislocation. There is no evidence of arthropathy or other focal bone abnormality. Soft tissues are unremarkable. IMPRESSION: Negative. Electronically Signed   By: Monte Fantasia M.D.   On: 12/30/2019 05:14    Procedures Procedures   Medications Ordered in ED Medications - No data to display  ED Course  I have reviewed the triage vital signs and the nursing notes.  Pertinent imaging results that were available during my care of the patient were reviewed by me and considered in my medical decision making (see chart for details).  MDM Rules/Calculators/A&P Injury to left fourth toe.  She is sent for x-rays to evaluate for possible fracture.  Old records are reviewed, and she has no relevant past visits.  X-ray showed no evidence of fracture.  The toe is buddy taped, and she is given a postop shoe to use as needed.  Advised on ice and elevation, use over-the-counter analgesics as needed for pain.  Final Clinical Impression(s) / ED Diagnoses Final diagnoses:  Contusion of fourth toe of left foot, initial encounter    Rx / DC  Orders ED Discharge Orders    None       Delora Fuel, MD [id number] [phone]

## 2020-01-06 ENCOUNTER — Encounter (HOSPITAL_COMMUNITY): Payer: Self-pay

## 2020-01-06 ENCOUNTER — Emergency Department (HOSPITAL_COMMUNITY)
Admission: EM | Admit: 2020-01-06 | Discharge: 2020-01-07 | Disposition: A | Discharge status: Home / Self Care | Payer: Medicaid Other | Attending: Emergency Medicine | Admitting: Emergency Medicine

## 2020-01-06 ENCOUNTER — Other Ambulatory Visit: Payer: Self-pay

## 2020-01-06 DIAGNOSIS — Z79899 Other long term (current) drug therapy: Secondary | ICD-10-CM | POA: Insufficient documentation

## 2020-01-06 DIAGNOSIS — F909 Attention-deficit hyperactivity disorder, unspecified type: Secondary | ICD-10-CM | POA: Insufficient documentation

## 2020-01-06 DIAGNOSIS — I10 Essential (primary) hypertension: Secondary | ICD-10-CM | POA: Diagnosis not present

## 2020-01-06 DIAGNOSIS — L509 Urticaria, unspecified: Secondary | ICD-10-CM | POA: Insufficient documentation

## 2020-01-06 DIAGNOSIS — L0232 Furuncle of buttock: Secondary | ICD-10-CM | POA: Diagnosis not present

## 2020-01-06 DIAGNOSIS — F17210 Nicotine dependence, cigarettes, uncomplicated: Secondary | ICD-10-CM | POA: Insufficient documentation

## 2020-01-06 NOTE — ED Triage Notes (Signed)
Patient arrived stating she is having an allergic reaction to an unknown source. States she had a tylenol for a headache tonight at 2100, reports at 2300 she became itchy and developed hives. Took 50 mg Benadryl prior to arrival.

## 2020-01-07 MED ORDER — CEPHALEXIN 500 MG PO CAPS: ORAL_CAPSULE | ORAL | 0 refills | Status: DC

## 2020-01-07 MED ORDER — PREDNISONE 20 MG PO TABS
60.0000 mg | ORAL_TABLET | Freq: Once | ORAL | Status: AC
  Administered 2020-01-07: 60 mg via ORAL
  Filled 2020-01-07: qty 3

## 2020-01-07 MED ORDER — METHYLPREDNISOLONE 4 MG PO TBPK: ORAL_TABLET | ORAL | 0 refills | Status: DC

## 2020-01-07 NOTE — Discharge Instructions (Signed)
Use ichthammol ointment (drawing salve ) on your small boil. Get help right away if: You have a fever. You have pain in your abdomen. Your tongue or lips are swollen. Your eyelids are swollen. Your chest or throat feels tight. You have trouble breathing or swallowing.

## 2020-01-07 NOTE — ED Notes (Signed)
PT A/Ox4 and ambulatory at discharge. PT educated to take medication as prescribed and return for facial swelling difficulty breathing chest pain or concerning conditions.

## 2020-01-07 NOTE — ED Provider Notes (Signed)
[facility] DEPT Provider Note   CSN: [id number] Arrival date & time: 01/06/20  2338     History Chief Complaint  Patient presents with  . Allergic Reaction    Penny [name] is a 39 y.o. female who presents emergency department with chief complaint of hives.  Patient states that she took over-the-counter headache relief medication which includes Tylenol, aspirin and caffeine.  This is at approximately 9 PM.  At 1045 she began developing hives on her face and neck.  She had about 4 hives.  She took 50 mg of Benadryl Benadryl and they have resolved.  She is never had any allergic reaction before.  She denies exposure to hot or cold.  She denies any syncope, vomiting, involvement of her tongue, wheezing or scratchiness of her throat.  She has had complete resolution of her hives. Patient also complains of a small boil on her left buttock.  She denies any fevers or chills. HPI     Past Medical History:  Diagnosis Date  . ADHD (attention deficit hyperactivity disorder)   . Anxiety    no meds  . Chronic back pain   . Depression    no meds  . Essential hypertension    a. Dx 06/2016.  . Migraines    last one over 1 month ago  . Overweight   . Palpitations - Sinus Tachycardia    a. 12/2016 Echo: E f55-60%, no rwma, nl LA/RA sizes.  . Placenta previa    w/ 2 pregnancies.  . Preterm labor    c/s at 36 wks  . Scoliosis   . Sinus tachycardia     Patient Active Problem List   Diagnosis Date Noted  . Uterine fibroid 01/23/2016  . Right groin pain 01/23/2016  . History of C-section 01/23/2016  . Migraines   . Anxiety   . Depression     Past Surgical History:  Procedure Laterality Date  . CESAREAN SECTION  2010   x 1 at 36 wks in [place]  . CESAREAN SECTION  February 13, 2012  . CHROMOPERTUBATION  10/26/2015   Procedure: ATTEMPTED CHROMOPERTUBATION;  Surgeon: Crawford Givens, MD;  Location: [facility];  Service: Gynecology;;  . LAPAROSCOPIC LYSIS OF  ADHESIONS  10/26/2015   Procedure: LAPAROSCOPIC LYSIS OF ADHESIONS;  Surgeon: Crawford Givens, MD;  Location: [facility];  Service: Gynecology;;  . LAPAROSCOPY N/A 10/26/2015   Procedure: LAPAROSCOPY OPERATIVE WITH REMOVAL OF MISPLACED FILSHE CLIP;  Surgeon: Crawford Givens, MD;  Location: [facility];  Service: Gynecology;  Laterality: N/A;  . SVD  2003   x 1 in [place]  . TUBAL LIGATION    . WISDOM TOOTH EXTRACTION       OB History    Gravida  3   Para  3   Term  1   Preterm  2   AB  0   Living  3     SAB  0   TAB  0   Ectopic  0   Multiple  0   Live Births  3           Family History  Problem Relation Age of Onset  . Cancer Mother   . CAD Mother   . Hypertension Sister   . Sickle cell trait Daughter   . Asthma Son   . Asthma Paternal Grandmother   . Pseudochol deficiency Neg Hx   . Malignant hyperthermia Neg Hx   . Anesthesia problems Neg Hx   .  Hypotension Neg Hx     Social History   Tobacco Use  . Smoking status: Current Every Day Smoker    Packs/day: 0.10    Years: 18.00    Pack years: 1.80    Types: Cigarettes  . Smokeless tobacco: Never Used  Substance Use Topics  . Alcohol use: Yes    Comment: Rare   . Drug use: Not Currently    Types: Marijuana    Home Medications Prior to Admission medications   Medication Sig Start Date End Date Taking? Authorizing Provider  AIMOVIG 140 MG/ML SOAJ Inject 1,140 mg into the skin every 30 (thirty) days.  05/14/18   [provider]  atomoxetine (STRATTERA) 80 MG capsule Take 80 mg by mouth daily.     [provider]  butalbital-acetaminophen-caffeine (FIORICET) 50-325-40 MG tablet Take 1-2 tablets by mouth every 6 (six) hours as needed for headache. 09/10/19 09/09/20  Lucrezia Starch, MD  cyclobenzaprine (FLEXERIL) 10 MG tablet Take 10 mg by mouth daily as needed for muscle spasms (pain).    [provider]  DULoxetine (CYMBALTA) 30 MG capsule Take 30 mg by mouth daily. Pt takes with the  cymbalta 60mg  for a total of 90 mg    [provider]  DULoxetine (CYMBALTA) 60 MG capsule Take 60 mg by mouth daily. Pt takes with cymbalta 30 mg for a total of 90 mg    [provider]  gabapentin (NEURONTIN) 300 MG capsule Take 600 mg by mouth 3 (three) times daily as needed for headache.  01/31/19   [provider]  hydrochlorothiazide (HYDRODIURIL) 12.5 MG tablet Take 12.5 mg by mouth daily.  05/28/19   [provider]  hydrOXYzine (VISTARIL) 50 MG capsule Take 50 mg by mouth every 8 (eight) hours as needed for anxiety. 08/21/18   [provider]  ibuprofen (ADVIL) 800 MG tablet Take 800 mg by mouth every 8 (eight) hours as needed for moderate pain.    [provider]  metoprolol succinate (TOPROL-XL) 25 MG 24 hr tablet Take 25 mg by mouth daily.  03/03/17   [provider]  mirtazapine (REMERON) 30 MG tablet Take 30 mg by mouth at bedtime. 08/21/18   [provider]  promethazine (PHENERGAN) 25 MG tablet Take 1 tablet (25 mg total) by mouth every 6 (six) hours as needed for nausea or vomiting. 08/18/19   McDonald, [name], PA-C  Topiramate ER (TROKENDI XR) 200 MG CP24 Take 200 mg by mouth at bedtime.    [provider]  Cetirizine HCl 10 MG CAPS Take 1 capsule (10 mg total) by mouth daily for 10 days. Patient not taking: Reported on 06/22/2019 02/11/19 08/18/19  Wieters, Elesa Hacker, PA-C  Dextromethorphan HBr 10 MG/5ML SYRP 10 ml qhs prn cough at night time. Patient not taking: Reported on 06/22/2019 02/04/19 08/18/19  Rodriguez-Southworth, Sunday Spillers, PA-C    Allergies    Shrimp [shellfish allergy]  Review of Systems   Review of Systems Ten systems reviewed and are negative for acute change, except as noted in the HPI.   Physical Exam Updated Vital Signs BP 132/82   Pulse 83   Temp 98 F (36.7 C) (Oral)   Resp 20   LMP 12/20/2019 (Exact Date)   SpO2 100%   Physical Exam Vitals and nursing note reviewed.    Constitutional:      General: She is not in acute distress.    Appearance: She is well-developed. She is not diaphoretic.  HENT:  Head: Normocephalic and atraumatic.  Eyes:     General: No scleral icterus.    Conjunctiva/sclera: Conjunctivae normal.  Cardiovascular:     Rate and Rhythm: Normal rate and regular rhythm.     Heart sounds: Normal heart sounds. No murmur. No friction rub. No gallop.   Pulmonary:     Effort: Pulmonary effort is normal. No respiratory distress.     Breath sounds: Normal breath sounds.  Abdominal:     General: Bowel sounds are normal. There is no distension.     Palpations: Abdomen is soft. There is no mass.     Tenderness: There is no abdominal tenderness. There is no guarding.  Genitourinary:   Musculoskeletal:     Cervical back: Normal range of motion.  Skin:    General: Skin is warm and dry.  Neurological:     Mental Status: She is alert and oriented to person, place, and time.  Psychiatric:        Behavior: Behavior normal.     ED Results / Procedures / Treatments   Labs (all labs ordered are listed, but only abnormal results are displayed) Labs Reviewed - No data to display  EKG None  Radiology No results found.  Procedures Procedures (including critical care time)  Medications Ordered in ED Medications - No data to display  ED Course  I have reviewed the triage vital signs and the nursing notes.  Pertinent labs & imaging results that were available during my care of the patient were reviewed by me and considered in my medical decision making (see chart for details).    MDM Rules/Calculators/A&P                     Patient with apparent outbreak of hives.  Question if this was directly related to the medication she took.  She is already taken Benadryl.  I have given her dose of prednisone and she may continue with a prednisone Dosepak.  She is a very tiny boil of the left buttock.  There is a purulent head and I feel  confident if this she removed continues with warm soaks this will open on its own.  I have also advised her to take ichthammol ointment.  Patient given a prescription for Keflex to hold should she have any worsening symptoms and would be advised to reevaluate at that time.  She appears otherwise appropriate for discharge at this time.  Follow-up with her PCP.  Discussed return precautions Final Clinical Impression(s) / ED Diagnoses Final diagnoses:  None    Rx / DC Orders ED Discharge Orders    None       Margarita Mail, PA-C 01/07/20 [place], Delice Bison, DO 01/07/20 [phone]

## 2020-03-02 ENCOUNTER — Ambulatory Visit: Payer: Medicaid Other | Admitting: Allergy and Immunology

## 2020-03-04 ENCOUNTER — Other Ambulatory Visit: Payer: Self-pay

## 2020-03-04 ENCOUNTER — Ambulatory Visit (HOSPITAL_COMMUNITY)
Admission: EM | Admit: 2020-03-04 | Discharge: 2020-03-04 | Disposition: A | Discharge status: Home / Self Care | Payer: Medicaid Other | Attending: Internal Medicine | Admitting: Internal Medicine

## 2020-03-04 DIAGNOSIS — F410 Panic disorder [episodic paroxysmal anxiety] without agoraphobia: Secondary | ICD-10-CM | POA: Diagnosis not present

## 2020-03-04 LAB — CBG MONITORING, ED: Glucose-Capillary: 100 mg/dL — ABNORMAL HIGH (ref 70–99)

## 2020-03-04 LAB — BASIC METABOLIC PANEL
Anion gap: 10 (ref 5–15)
BUN: 11 mg/dL (ref 6–20)
CO2: 22 mmol/L (ref 22–32)
Calcium: 8.8 mg/dL — ABNORMAL LOW (ref 8.9–10.3)
Chloride: 109 mmol/L (ref 98–111)
Creatinine, Ser: 0.65 mg/dL (ref 0.44–1.00)
GFR calc Af Amer: >60 mL/min (ref >60)
GFR calc non Af Amer: >60 mL/min (ref >60)
Glucose, Bld: 106 mg/dL — ABNORMAL HIGH (ref 70–99)
Potassium: 3.8 mmol/L (ref 3.5–5.1)
Sodium: 141 mmol/L (ref 135–145)

## 2020-03-04 LAB — TSH: TSH: 1.184 u[IU]/mL (ref 0.350–4.500)

## 2020-03-04 LAB — GLUCOSE, CAPILLARY: Glucose-Capillary: 100 mg/dL — ABNORMAL HIGH (ref 70–99)

## 2020-03-04 NOTE — ED Provider Notes (Signed)
[place]    CSN: [id number] Arrival date & time: 03/04/20  1253      History   Chief Complaint Chief Complaint  Patient presents with  . Fatigue    HPI Penny [name] is a 39 y.o. female comes to urgent care with complaints of sudden onset palpitations, feeling of impending doom, some anxiety and sudden onset of headache.  She also had some difficulty breathing with the symptoms.  The whole episode lasted about 5 minutes and coincidentally improved after she had a glass of orange juice.  She did not lose consciousness.  She had some numbing sensation over her body.  No fever or chills.  No upper respiratory infection symptoms.  No loss of consciousness.   Patient is worried that her blood sugar will be either too low or too high and wants that evaluated today.  No history of diabetes.  No changes in her medications. HPI  Past Medical History:  Diagnosis Date  . ADHD (attention deficit hyperactivity disorder)   . Anxiety    no meds  . Chronic back pain   . Depression    no meds  . Essential hypertension    a. Dx 06/2016.  . Migraines    last one over 1 month ago  . Overweight   . Palpitations - Sinus Tachycardia    a. 12/2016 Echo: E f55-60%, no rwma, nl LA/RA sizes.  . Placenta previa    w/ 2 pregnancies.  . Preterm labor    c/s at 36 wks  . Scoliosis   . Sinus tachycardia     Patient Active Problem List   Diagnosis Date Noted  . Uterine fibroid 01/23/2016  . Right groin pain 01/23/2016  . History of C-section 01/23/2016  . Migraines   . Anxiety   . Depression     Past Surgical History:  Procedure Laterality Date  . CESAREAN SECTION  2010   x 1 at 36 wks in [place]  . CESAREAN SECTION  February 13, 2012  . CHROMOPERTUBATION  10/26/2015   Procedure: ATTEMPTED CHROMOPERTUBATION;  Surgeon: Crawford Givens, MD;  Location: [facility];  Service: Gynecology;;  . LAPAROSCOPIC LYSIS OF ADHESIONS  10/26/2015   Procedure: LAPAROSCOPIC LYSIS OF ADHESIONS;   Surgeon: Crawford Givens, MD;  Location: [facility];  Service: Gynecology;;  . LAPAROSCOPY N/A 10/26/2015   Procedure: LAPAROSCOPY OPERATIVE WITH REMOVAL OF MISPLACED FILSHE CLIP;  Surgeon: Crawford Givens, MD;  Location: [facility];  Service: Gynecology;  Laterality: N/A;  . SVD  2003   x 1 in [place]  . TUBAL LIGATION    . WISDOM TOOTH EXTRACTION      OB History    Gravida  3   Para  3   Term  1   Preterm  2   AB  0   Living  3     SAB  0   TAB  0   Ectopic  0   Multiple  0   Live Births  3            Home Medications    Prior to Admission medications   Medication Sig Start Date End Date Taking? Authorizing Provider  tiZANidine (ZANAFLEX) 4 MG capsule Take 4 mg by mouth 3 (three) times daily.   Yes [provider]  AIMOVIG 140 MG/ML SOAJ Inject 1,140 mg into the skin every 30 (thirty) days.  05/14/18   [provider]  atomoxetine (STRATTERA) 80 MG capsule Take 80 mg by  mouth daily.     [provider]  butalbital-acetaminophen-caffeine (FIORICET) 50-325-40 MG tablet Take 1-2 tablets by mouth every 6 (six) hours as needed for headache. 09/10/19 09/09/20  Lucrezia Starch, MD  DULoxetine (CYMBALTA) 30 MG capsule Take 30 mg by mouth daily. Pt takes with the cymbalta 60mg  for a total of 90 mg    [provider]  DULoxetine (CYMBALTA) 60 MG capsule Take 60 mg by mouth daily. Pt takes with cymbalta 30 mg for a total of 90 mg    [provider]  gabapentin (NEURONTIN) 300 MG capsule Take 600 mg by mouth 3 (three) times daily as needed for headache.  01/31/19   [provider]  hydrochlorothiazide (HYDRODIURIL) 12.5 MG tablet Take 12.5 mg by mouth daily.  05/28/19   [provider]  hydrOXYzine (VISTARIL) 50 MG capsule Take 50 mg by mouth every 8 (eight) hours as needed for anxiety. 08/21/18   [provider]  ibuprofen (ADVIL) 800 MG tablet Take 800 mg by mouth every 8 (eight) hours as needed for moderate pain.     [provider]  metoprolol succinate (TOPROL-XL) 25 MG 24 hr tablet Take 25 mg by mouth daily.  03/03/17   [provider]  mirtazapine (REMERON) 30 MG tablet Take 30 mg by mouth at bedtime. 08/21/18   [provider]  promethazine (PHENERGAN) 25 MG tablet Take 1 tablet (25 mg total) by mouth every 6 (six) hours as needed for nausea or vomiting. 08/18/19   McDonald, [name], PA-C  Topiramate ER (TROKENDI XR) 200 MG CP24 Take 200 mg by mouth at bedtime.    [provider]  Cetirizine HCl 10 MG CAPS Take 1 capsule (10 mg total) by mouth daily for 10 days. Patient not taking: Reported on 06/22/2019 02/11/19 08/18/19  Wieters, Elesa Hacker, PA-C  Dextromethorphan HBr 10 MG/5ML SYRP 10 ml qhs prn cough at night time. Patient not taking: Reported on 06/22/2019 02/04/19 08/18/19  Rodriguez-Southworth, Sunday Spillers, PA-C    Family History Family History  Problem Relation Age of Onset  . Cancer Mother   . CAD Mother   . Hypertension Sister   . Sickle cell trait Daughter   . Asthma Son   . Asthma Paternal Grandmother   . Pseudochol deficiency Neg Hx   . Malignant hyperthermia Neg Hx   . Anesthesia problems Neg Hx   . Hypotension Neg Hx     Social History Social History   Tobacco Use  . Smoking status: Current Every Day Smoker    Packs/day: 0.10    Years: 18.00    Pack years: 1.80    Types: Cigarettes  . Smokeless tobacco: Never Used  Substance Use Topics  . Alcohol use: Yes    Comment: Rare   . Drug use: Not Currently    Types: Marijuana     Allergies   Shrimp [shellfish allergy]   Review of Systems Review of Systems  Constitutional: Negative.   HENT: Negative.   Respiratory: Negative.   Gastrointestinal: Negative.   Genitourinary: Negative.   Musculoskeletal: Negative.   Skin: Negative.   Neurological: Positive for light-headedness, numbness and headaches. Negative for dizziness, tremors, facial asymmetry and weakness.  Psychiatric/Behavioral: Negative  for confusion. The patient is nervous/anxious.      Physical Exam Triage Vital Signs ED Triage Vitals [03/04/20 1304]  Enc Vitals Group     BP 122/79     Pulse Rate 90     Resp 16  Temp 98.5 F (36.9 C)     Temp src      SpO2 100 %     Weight      Height      Head Circumference      Peak Flow      Pain Score      Pain Loc      Pain Edu?      Excl. in [place]?    No data found.  Updated Vital Signs BP 122/79   Pulse 90   Temp 98.5 F (36.9 C)   Resp 16   LMP 03/04/2020   SpO2 100%   Visual Acuity Right Eye Distance:   Left Eye Distance:   Bilateral Distance:    Right Eye Near:   Left Eye Near:    Bilateral Near:     Physical Exam Constitutional:      General: She is not in acute distress.    Appearance: Normal appearance. She is not ill-appearing.  Cardiovascular:     Rate and Rhythm: Normal rate and regular rhythm.     Pulses: Normal pulses.     Heart sounds: Normal heart sounds.  Abdominal:     General: Bowel sounds are normal.     Palpations: Abdomen is soft.  Skin:    Capillary Refill: Capillary refill takes less than 2 seconds.  Neurological:     General: No focal deficit present.     Mental Status: She is alert. Mental status is at baseline. She is disoriented.     Cranial Nerves: No cranial nerve deficit.     Sensory: No sensory deficit.     Motor: No weakness.     Coordination: Coordination normal.     Gait: Gait normal.     Deep Tendon Reflexes: Reflexes normal.      UC Treatments / Results  Labs (all labs ordered are listed, but only abnormal results are displayed) Labs Reviewed  GLUCOSE, CAPILLARY - Abnormal; Notable for the following components:      Result Value   Glucose-Capillary 100 (*)    All other components within normal limits  CBG MONITORING, ED - Abnormal; Notable for the following components:   Glucose-Capillary 100 (*)    All other components within normal limits  BASIC METABOLIC PANEL  TSH     EKG   Radiology No results found.  Procedures Procedures (including critical care time)  Medications Ordered in UC Medications - No data to display  Initial Impression / Assessment and Plan / UC Course  I have reviewed the triage vital signs and the nursing notes.  Pertinent labs & imaging results that were available during my care of the patient were reviewed by me and considered in my medical decision making (see chart for details).    1.  Anxiety attack-self-limiting: Capillary blood sugar is 100 BMP Patient is reassured to evaluate her social situation for situational stresses.  Stress management education was given. Return precautions were given as well. Final Clinical Impressions(s) / UC Diagnoses   Final diagnoses:  Anxiety attack   Discharge Instructions   None    ED Prescriptions    None     PDMP not reviewed this encounter.   Chase Picket, MD 03/04/20 1415

## 2020-03-04 NOTE — ED Triage Notes (Signed)
Pt reports episode of sudden onset headache, "my breathing got funny, my heart was racing and my blood pressure was up." Pt reports symptoms improved after drinking juice. Wants blood sugar and vitals checked. No hx of diabetes.

## 2020-03-31 ENCOUNTER — Ambulatory Visit: Payer: Medicaid Other | Admitting: Allergy & Immunology

## 2020-04-20 ENCOUNTER — Ambulatory Visit: Payer: Medicaid Other | Admitting: Allergy and Immunology

## 2020-05-12 ENCOUNTER — Encounter (HOSPITAL_COMMUNITY): Payer: Self-pay

## 2020-05-12 ENCOUNTER — Ambulatory Visit (HOSPITAL_COMMUNITY)
Admission: RE | Admit: 2020-05-12 | Discharge: 2020-05-12 | Disposition: A | Discharge status: Home / Self Care | Payer: Medicaid Other | Source: Ambulatory Visit | Attending: Urgent Care | Admitting: Urgent Care

## 2020-05-12 ENCOUNTER — Other Ambulatory Visit: Payer: Self-pay

## 2020-05-12 VITALS — BP 113/74 | HR 85 | Temp 97.6°F | Resp 16

## 2020-05-12 DIAGNOSIS — K602 Anal fissure, unspecified: Secondary | ICD-10-CM | POA: Diagnosis not present

## 2020-05-12 DIAGNOSIS — M79641 Pain in right hand: Secondary | ICD-10-CM

## 2020-05-12 DIAGNOSIS — K644 Residual hemorrhoidal skin tags: Secondary | ICD-10-CM

## 2020-05-12 MED ORDER — HYDROCORTISONE 1 % EX CREA: TOPICAL_CREAM | CUTANEOUS | 0 refills | Status: DC

## 2020-05-12 MED ORDER — LIDOCAINE (ANORECTAL) 5 % EX GEL: CUTANEOUS | 0 refills | Status: DC

## 2020-05-12 MED ORDER — NAPROXEN 500 MG PO TABS: 500.0000 mg | ORAL_TABLET | Freq: Two times a day (BID) | ORAL | 0 refills | Status: DC

## 2020-05-12 NOTE — ED Triage Notes (Signed)
Pt c/o right hand pain onset Saturday. She is having some swelling around thumb and index. She recently started a job with housekeeping. Pain is worse with ROM such  As pulling and twisting. Pt states she has been wearing a thumb spica arm brace with no relief. Pt also c/o rectal itching. She states she has a pea size bump outside her rectum.

## 2020-05-12 NOTE — ED Provider Notes (Addendum)
[name]   MRN: [id number] DOB: 03-11-1981  Subjective:   Penny [name] is a 39 y.o. female presenting for 1 week history of persistent right hand pain over radial side extending to thumb and index finger.  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  Denies any specific fall, trauma noted more swelling worse after work.  No warmth or erythema, history of gout.  Has used over-the-counter creams, thumb spica without relief.  Has tried Advil as well.  Patient has also had some intermittent itching about the anal area which she feels are like bumps.  Admits mixed diet, some constipation intermittently each month.  Takes hydrochlorothiazide daily and tizanidine as needed.  No current facility-administered medications for this encounter.  Current Outpatient Medications:  .  AIMOVIG 140 MG/ML SOAJ, Inject 1,140 mg into the skin every 30 (thirty) days. , Disp: , Rfl: 3 .  atomoxetine (STRATTERA) 80 MG capsule, Take 80 mg by mouth daily. , Disp: , Rfl:  .  butalbital-acetaminophen-caffeine (FIORICET) 50-325-40 MG tablet, Take 1-2 tablets by mouth every 6 (six) hours as needed for headache., Disp: 20 tablet, Rfl: 0 .  DULoxetine (CYMBALTA) 30 MG capsule, Take 30 mg by mouth daily. Pt takes with the cymbalta 60mg  for a total of 90 mg, Disp: , Rfl:  .  DULoxetine (CYMBALTA) 60 MG capsule, Take 60 mg by mouth daily. Pt takes with cymbalta 30 mg for a total of 90 mg, Disp: , Rfl:  .  gabapentin (NEURONTIN) 300 MG capsule, Take 600 mg by mouth 3 (three) times daily as needed for headache. , Disp: , Rfl:  .  hydrochlorothiazide (HYDRODIURIL) 12.5 MG tablet, Take 12.5 mg by mouth daily. , Disp: , Rfl:  .  hydrOXYzine (VISTARIL) 50 MG capsule, Take 50 mg by mouth every 8 (eight) hours as needed for anxiety., Disp: , Rfl: 2 .  ibuprofen (ADVIL) 800 MG tablet, Take 800 mg by mouth every 8 (eight) hours as needed for moderate  pain., Disp: , Rfl:  .  metoprolol succinate (TOPROL-XL) 25 MG 24 hr tablet, Take 25 mg by mouth daily. , Disp: , Rfl: 3 .  mirtazapine (REMERON) 30 MG tablet, Take 30 mg by mouth at bedtime., Disp: , Rfl: 2 .  promethazine (PHENERGAN) 25 MG tablet, Take 1 tablet (25 mg total) by mouth every 6 (six) hours as needed for nausea or vomiting., Disp: 30 tablet, Rfl: 0 .  tiZANidine (ZANAFLEX) 4 MG capsule, Take 4 mg by mouth 3 (three) times daily., Disp: , Rfl:  .  Topiramate ER (TROKENDI XR) 200 MG CP24, Take 200 mg by mouth at bedtime., Disp: , Rfl:    Allergies  Allergen Reactions  . Shrimp [Shellfish Allergy] Itching and Swelling    Itching and swelling in mouth and face    Past Medical History:  Diagnosis Date  . ADHD (attention deficit hyperactivity disorder)   . Anxiety    no meds  . Chronic back pain   . Depression    no meds  . Essential hypertension    a. Dx 06/2016.  . Migraines    last one over 1 month ago  . Overweight   . Palpitations - Sinus Tachycardia    a. 12/2016 Echo: E f55-60%, no rwma, nl LA/RA sizes.  . Placenta previa    w/ 2 pregnancies.  . Preterm labor    c/s at 36 wks  .  Scoliosis   . Sinus tachycardia      Past Surgical History:  Procedure Laterality Date  . CESAREAN SECTION  2010   x 1 at 36 wks in [place]  . CESAREAN SECTION  February 13, 2012  . CHROMOPERTUBATION  10/26/2015   Procedure: ATTEMPTED CHROMOPERTUBATION;  Surgeon: Crawford Givens, MD;  Location: [facility];  Service: Gynecology;;  . LAPAROSCOPIC LYSIS OF ADHESIONS  10/26/2015   Procedure: LAPAROSCOPIC LYSIS OF ADHESIONS;  Surgeon: Crawford Givens, MD;  Location: [facility];  Service: Gynecology;;  . LAPAROSCOPY N/A 10/26/2015   Procedure: LAPAROSCOPY OPERATIVE WITH REMOVAL OF MISPLACED FILSHE CLIP;  Surgeon: Crawford Givens, MD;  Location: [facility];  Service: Gynecology;  Laterality: N/A;  . SVD  2003   x 1 in [place]  . TUBAL LIGATION    . WISDOM TOOTH EXTRACTION      Family History  Problem  Relation Age of Onset  . Cancer Mother   . CAD Mother   . Hypertension Sister   . Sickle cell trait Daughter   . Asthma Son   . Asthma Paternal Grandmother   . Pseudochol deficiency Neg Hx   . Malignant hyperthermia Neg Hx   . Anesthesia problems Neg Hx   . Hypotension Neg Hx     Social History   Tobacco Use  . Smoking status: Current Every Day Smoker    Packs/day: 0.10    Years: 18.00    Pack years: 1.80    Types: Cigarettes  . Smokeless tobacco: Never Used  Vaping Use  . Vaping Use: Former  Substance Use Topics  . Alcohol use: Yes    Comment: Rare   . Drug use: Not Currently    Types: Marijuana    ROS   Objective:   Vitals: BP 113/74 (BP Location: Right Arm)   Pulse 85   Temp 97.6 F (36.4 C) (Oral)   Resp 16   SpO2 97%   Physical Exam Exam conducted with a chaperone present (CMA Desiree).  Constitutional:      General: She is not in acute distress.    Appearance: Normal appearance. She is well-developed. She is not ill-appearing, toxic-appearing or diaphoretic.  HENT:     Head: Normocephalic and atraumatic.     Nose: Nose normal.     Mouth/Throat:     Mouth: Mucous membranes are moist.     Pharynx: Oropharynx is clear.  Eyes:     General: No scleral icterus.       Right eye: No discharge.        Left eye: No discharge.     Extraocular Movements: Extraocular movements intact.     Conjunctiva/sclera: Conjunctivae normal.     Pupils: Pupils are equal, round, and reactive to light.  Cardiovascular:     Rate and Rhythm: Normal rate.  Pulmonary:     Effort: Pulmonary effort is normal.  Genitourinary:   Musculoskeletal:     Right hand: Swelling (trace) and tenderness (over areas outlined with associated trace swelling) present. No deformity, lacerations or bony tenderness. Normal range of motion. Normal strength. Normal sensation. Normal capillary refill.       Hands:  Skin:    General: Skin is warm and dry.  Neurological:     General: No focal  deficit present.     Mental Status: She is alert and oriented to person, place, and time.  Psychiatric:        Mood and Affect: Mood normal.  Behavior: Behavior normal.        Thought Content: Thought content normal.        Judgment: Judgment normal.       Assessment and Plan :   PDMP not reviewed this encounter.  1. Anal fissure   2. External hemorrhoid   3. Right hand pain     Suspect musculoskeletal pain secondary to overuse, her new job.  Recommended use of an Ace wrap, provided in clinic and naproxen.  Encourage patient to rest from work and eventually she agreed to a note for work.  Do cold ice water after shifts when she gets back to work.  Counseled on the nature of anal fissure, external hemorrhoids and recommended lidocaine gel, hydrocortisone rectal. Counseled patient on potential for adverse effects with medications prescribed/recommended today, ER and return-to-clinic precautions discussed, patient verbalized understanding.   Jaynee Eagles, [place] 05/12/20 1635

## 2020-05-27 ENCOUNTER — Encounter (HOSPITAL_COMMUNITY): Payer: Self-pay | Admitting: Emergency Medicine

## 2020-05-27 ENCOUNTER — Other Ambulatory Visit: Payer: Self-pay

## 2020-05-27 ENCOUNTER — Ambulatory Visit (HOSPITAL_COMMUNITY)
Admission: EM | Admit: 2020-05-27 | Discharge: 2020-05-27 | Disposition: A | Discharge status: Home / Self Care | Payer: Medicaid Other | Attending: Internal Medicine | Admitting: Internal Medicine

## 2020-05-27 DIAGNOSIS — F329 Major depressive disorder, single episode, unspecified: Secondary | ICD-10-CM | POA: Insufficient documentation

## 2020-05-27 DIAGNOSIS — I10 Essential (primary) hypertension: Secondary | ICD-10-CM | POA: Diagnosis not present

## 2020-05-27 DIAGNOSIS — F17210 Nicotine dependence, cigarettes, uncomplicated: Secondary | ICD-10-CM | POA: Insufficient documentation

## 2020-05-27 DIAGNOSIS — Z20822 Contact with and (suspected) exposure to covid-19: Secondary | ICD-10-CM | POA: Diagnosis not present

## 2020-05-27 DIAGNOSIS — Z79899 Other long term (current) drug therapy: Secondary | ICD-10-CM | POA: Insufficient documentation

## 2020-05-27 DIAGNOSIS — Z8249 Family history of ischemic heart disease and other diseases of the circulatory system: Secondary | ICD-10-CM | POA: Insufficient documentation

## 2020-05-27 DIAGNOSIS — M419 Scoliosis, unspecified: Secondary | ICD-10-CM | POA: Insufficient documentation

## 2020-05-27 DIAGNOSIS — J069 Acute upper respiratory infection, unspecified: Secondary | ICD-10-CM | POA: Diagnosis not present

## 2020-05-27 DIAGNOSIS — Z791 Long term (current) use of non-steroidal anti-inflammatories (NSAID): Secondary | ICD-10-CM | POA: Insufficient documentation

## 2020-05-27 DIAGNOSIS — F909 Attention-deficit hyperactivity disorder, unspecified type: Secondary | ICD-10-CM | POA: Diagnosis not present

## 2020-05-27 DIAGNOSIS — F419 Anxiety disorder, unspecified: Secondary | ICD-10-CM | POA: Insufficient documentation

## 2020-05-27 MED ORDER — FLUTICASONE PROPIONATE 50 MCG/ACT NA SUSP: 1.0000 | Freq: Every day | NASAL | 0 refills | Status: DC

## 2020-05-27 MED ORDER — BENZONATATE 100 MG PO CAPS: 100.0000 mg | ORAL_CAPSULE | Freq: Two times a day (BID) | ORAL | 0 refills | Status: DC | PRN

## 2020-05-27 NOTE — Discharge Instructions (Addendum)
Your Covid test results will be available on your MyChart sometime tomorrow.  Use Flonase daily for nasal congestion.  Use Tessalon Perles as needed for cough.  Motrin 600 mg every 8 hours as needed for body aches, fever or discomfort.

## 2020-05-27 NOTE — ED Triage Notes (Signed)
Pt here with nasal congestion and allergy sx starting yesterday; pt declines covid testing and sts if needed will have done some where else

## 2020-05-27 NOTE — ED Provider Notes (Signed)
Penny [name]    CSN: [id number] Arrival date & time: 05/27/20  1833      History   Chief Complaint Chief Complaint  Patient presents with  . Nasal Congestion    HPI Penny [name] is a 39 y.o. female with past medical history of anxiety, ADHD, depression presents to urgent care with complaints of congestion.  Patient reports onset of significant nasal and head congestion yesterday while at work and progressing throughout the evening.  Patient reports cough, nonproductive.  She denies any shortness of breath, fever or chills.  She has not been vaccinated for COVID-19.  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    Past Medical History:  Diagnosis Date  . ADHD (attention deficit hyperactivity disorder)   . Anxiety    no meds  . Chronic back pain   . Depression    no meds  . Essential hypertension    a. Dx 06/2016.  . Migraines    last one over 1 month ago  . Overweight   . Palpitations - Sinus Tachycardia    a. 12/2016 Echo: E f55-60%, no rwma, nl LA/RA sizes.  . Placenta previa    w/ 2 pregnancies.  . Preterm labor    c/s at 36 wks  . Scoliosis   . Sinus tachycardia     Patient Active Problem List   Diagnosis Date Noted  . Uterine fibroid 01/23/2016  . Right groin pain 01/23/2016  . History of C-section 01/23/2016  . Migraines   . Anxiety   . Depression     Past Surgical History:  Procedure Laterality Date  . CESAREAN SECTION  2010   x 1 at 36 wks in [place]  . CESAREAN SECTION  February 13, 2012  . CHROMOPERTUBATION  10/26/2015   Procedure: ATTEMPTED CHROMOPERTUBATION;  Surgeon: Crawford Givens, MD;  Location: [facility];  Service: Gynecology;;  . LAPAROSCOPIC LYSIS OF ADHESIONS  10/26/2015   Procedure: LAPAROSCOPIC LYSIS OF ADHESIONS;  Surgeon: Crawford Givens, MD;  Location: [facility];  Service: Gynecology;;  . LAPAROSCOPY N/A 10/26/2015   Procedure: LAPAROSCOPY OPERATIVE WITH REMOVAL OF MISPLACED FILSHE CLIP;  Surgeon: Crawford Givens, MD;  Location: [facility];   Service: Gynecology;  Laterality: N/A;  . SVD  2003   x 1 in [place]  . TUBAL LIGATION    . WISDOM TOOTH EXTRACTION      OB History    Gravida  3   Para  3   Term  1   Preterm  2   AB  0   Living  3     SAB  0   TAB  0   Ectopic  0   Multiple  0   Live Births  3            Home Medications    Prior to Admission medications   Medication Sig Start Date End Date Taking? Authorizing Provider  AIMOVIG 140 MG/ML SOAJ Inject 1,140 mg into the skin every 30 (thirty) days.  05/14/18   [provider]  atomoxetine (STRATTERA) 80 MG capsule Take 80 mg by mouth daily.     [provider]  benzonatate (TESSALON PERLES) 100 MG capsule Take 1 capsule (100 mg total) by mouth 2 (two) times daily as needed for cough. 05/27/20 05/27/21  Rudolpho Sevin, NP  butalbital-acetaminophen-caffeine (FIORICET) [phone] MG tablet Take 1-2 tablets by mouth every 6 (six) hours as needed for headache. 09/10/19 09/09/20  Lucrezia Starch, MD  DULoxetine (  CYMBALTA) 30 MG capsule Take 30 mg by mouth daily. Pt takes with the cymbalta 60mg  for a total of 90 mg    [provider]  DULoxetine (CYMBALTA) 60 MG capsule Take 60 mg by mouth daily. Pt takes with cymbalta 30 mg for a total of 90 mg    [provider]  fluticasone (FLONASE) 50 MCG/ACT nasal spray Place 1 spray into both nostrils daily. 05/27/20 05/27/21  Rudolpho Sevin, NP  gabapentin (NEURONTIN) 300 MG capsule Take 600 mg by mouth 3 (three) times daily as needed for headache.  01/31/19   [provider]  hydrochlorothiazide (HYDRODIURIL) 12.5 MG tablet Take 12.5 mg by mouth daily.  05/28/19   [provider]  hydrocortisone cream 1 % Apply to affected area 2 times daily 05/12/20   Jaynee Eagles, PA-C  hydrOXYzine (VISTARIL) 50 MG capsule Take 50 mg by mouth every 8 (eight) hours as needed for anxiety. 08/21/18   [provider]  ibuprofen (ADVIL) 800 MG tablet Take 800 mg by mouth every 8  (eight) hours as needed for moderate pain.    [provider]  Lidocaine, Anorectal, 5 % GEL Apply a pea-sized amount to the affected area 2 times daily. 05/12/20   Jaynee Eagles, PA-C  metoprolol succinate (TOPROL-XL) 25 MG 24 hr tablet Take 25 mg by mouth daily.  03/03/17   [provider]  mirtazapine (REMERON) 30 MG tablet Take 30 mg by mouth at bedtime. 08/21/18   [provider]  naproxen (NAPROSYN) 500 MG tablet Take 1 tablet (500 mg total) by mouth 2 (two) times daily with a meal. 05/12/20   Jaynee Eagles, PA-C  promethazine (PHENERGAN) 25 MG tablet Take 1 tablet (25 mg total) by mouth every 6 (six) hours as needed for nausea or vomiting. 08/18/19   McDonald, [name], PA-C  tiZANidine (ZANAFLEX) 4 MG capsule Take 4 mg by mouth 3 (three) times daily.    [provider]  Topiramate ER (TROKENDI XR) 200 MG CP24 Take 200 mg by mouth at bedtime.    [provider]  Cetirizine HCl 10 MG CAPS Take 1 capsule (10 mg total) by mouth daily for 10 days. Patient not taking: Reported on 06/22/2019 02/11/19 08/18/19  Wieters, Elesa Hacker, PA-C  Dextromethorphan HBr 10 MG/5ML SYRP 10 ml qhs prn cough at night time. Patient not taking: Reported on 06/22/2019 02/04/19 08/18/19  Rodriguez-Southworth, Sunday Spillers, PA-C    Family History Family History  Problem Relation Age of Onset  . Cancer Mother   . CAD Mother   . Hypertension Sister   . Sickle cell trait Daughter   . Asthma Son   . Asthma Paternal Grandmother   . Pseudochol deficiency Neg Hx   . Malignant hyperthermia Neg Hx   . Anesthesia problems Neg Hx   . Hypotension Neg Hx     Social History Social History   Tobacco Use  . Smoking status: Current Every Day Smoker    Packs/day: 0.10    Years: 18.00    Pack years: 1.80    Types: Cigarettes  . Smokeless tobacco: Never Used  Vaping Use  . Vaping Use: Former  Substance Use Topics  . Alcohol use: Yes    Comment: Rare   . Drug use: Not Currently    Types:  Marijuana     Allergies   Shrimp [shellfish allergy]   Review of Systems As stated in HPI otherwise negative   Physical Exam Triage Vital Signs ED Triage Vitals [  05/27/20 1911]  Enc Vitals Group     BP 136/85     Pulse Rate 99     Resp 18     Temp 99.3 F (37.4 C)     Temp Source Oral     SpO2 99 %     Weight      Height      Head Circumference      Peak Flow      Pain Score 5     Pain Loc      Pain Edu?      Excl. in [place]?    No data found.  Updated Vital Signs BP 136/85 (BP Location: Right Arm)   Pulse 99   Temp 99.3 F (37.4 C) (Oral)   Resp 18   SpO2 99%      Physical Exam Constitutional:      General: She is not in acute distress.    Appearance: Normal appearance. She is not toxic-appearing.  HENT:     Right Ear: Ear canal and external ear normal.     Left Ear: Ear canal and external ear normal.     Ears:     Comments: + air-fluid levels bilaterally right R>L    Nose: Congestion present.     Mouth/Throat:     Mouth: Mucous membranes are moist.     Pharynx: Oropharynx is clear. No oropharyngeal exudate or posterior oropharyngeal erythema.  Pulmonary:     Effort: Pulmonary effort is normal.     Breath sounds: Normal breath sounds.  Musculoskeletal:     Cervical back: Normal range of motion and neck supple.  Lymphadenopathy:     Cervical: No cervical adenopathy.  Skin:    General: Skin is warm and dry.  Neurological:     Mental Status: She is alert.  Psychiatric:     Comments: Very anxious on exam      UC Treatments / Results  Labs (all labs ordered are listed, but only abnormal results are displayed) Labs Reviewed  SARS CORONAVIRUS 2 (TAT 6-24 HRS)    EKG   Radiology No results found.  Procedures Procedures (including critical care time)  Medications Ordered in UC Medications - No data to display  Initial Impression / Assessment and Plan / UC Course  I have reviewed the triage vital signs and the nursing notes.  Pertinent  labs & imaging results that were available during my care of the patient were reviewed by me and considered in my medical decision making (see chart for details).  URI -Rule out Covid.  Isolation precautions discussed -Continue Xyzal daily.  Add Flonase, Tessalon Perles as needed -Encourage fluids, Motrin as needed -Follow-up as needed  Final Clinical Impressions(s) / UC Diagnoses   Final diagnoses:  Upper respiratory tract infection, unspecified type     Discharge Instructions     Your Covid test results will be available on your MyChart sometime tomorrow.  Use Flonase daily for nasal congestion.  Use Tessalon Perles as needed for cough.  Motrin 600 mg every 8 hours as needed for body aches, fever or discomfort.    ED Prescriptions    Medication Sig Dispense Auth. Provider   fluticasone (FLONASE) 50 MCG/ACT nasal spray Place 1 spray into both nostrils daily. 30 mL Rudolpho Sevin, NP   benzonatate (TESSALON PERLES) 100 MG capsule Take 1 capsule (100 mg total) by mouth 2 (two) times daily as needed for cough. 30 capsule Rudolpho Sevin, NP  PDMP not reviewed this encounter.   Rudolpho Sevin, NP 05/27/20 1945

## 2020-05-28 ENCOUNTER — Encounter (HOSPITAL_COMMUNITY): Payer: Self-pay | Admitting: *Deleted

## 2020-05-28 ENCOUNTER — Ambulatory Visit (HOSPITAL_COMMUNITY)
Admission: EM | Admit: 2020-05-28 | Discharge: 2020-05-28 | Disposition: A | Discharge status: Home / Self Care | Payer: Medicaid Other | Attending: Internal Medicine | Admitting: Internal Medicine

## 2020-05-28 ENCOUNTER — Other Ambulatory Visit: Payer: Self-pay

## 2020-05-28 DIAGNOSIS — J209 Acute bronchitis, unspecified: Secondary | ICD-10-CM

## 2020-05-28 LAB — SARS CORONAVIRUS 2 (TAT 6-24 HRS): SARS Coronavirus 2: NEGATIVE

## 2020-05-28 MED ORDER — IPRATROPIUM BROMIDE 0.03 % NA SOLN: 2.0000 | Freq: Two times a day (BID) | NASAL | 12 refills | Status: DC

## 2020-05-28 MED ORDER — PREDNISONE 20 MG PO TABS: 20.0000 mg | ORAL_TABLET | Freq: Every day | ORAL | 0 refills | Status: AC

## 2020-05-28 NOTE — ED Triage Notes (Addendum)
Patient reports cough, nasal congestion, pain with breathing, states chest feels weird when breathing. Reports headache and ear congestion. Patient states she was here yesterday for the same and states that he is feeling worse today. States did nasal spray and tessalon pearls.   COVID test negative.

## 2020-05-28 NOTE — ED Provider Notes (Signed)
[place]    CSN: [id number] Arrival date & time: 05/28/20  1218      History   Chief Complaint Chief Complaint  Patient presents with  . Shortness of Breath    HPI Penny [name] is a 39 y.o. female returns to urgent care today complaining of worsening nonproductive cough, postnasal drainage and shortness of breath.  Patient was diagnosed with Covid negative upper respiratory infection symptoms yesterday.  She was prescribed fluticasone nasal spray and Tessalon Perles.  Patient says her symptoms worsen last night after she started using the prescribed medications.  She denies any wheezing.  No nausea or vomiting.  Patient is a smoker and she continues to smoke even though she is feeling very well.Marland Kitchen   HPI  Past Medical History:  Diagnosis Date  . ADHD (attention deficit hyperactivity disorder)   . Anxiety    no meds  . Chronic back pain   . Depression    no meds  . Essential hypertension    a. Dx 06/2016.  . Migraines    last one over 1 month ago  . Overweight   . Palpitations - Sinus Tachycardia    a. 12/2016 Echo: E f55-60%, no rwma, nl LA/RA sizes.  . Placenta previa    w/ 2 pregnancies.  . Preterm labor    c/s at 36 wks  . Scoliosis   . Sinus tachycardia     Patient Active Problem List   Diagnosis Date Noted  . Uterine fibroid 01/23/2016  . Right groin pain 01/23/2016  . History of C-section 01/23/2016  . Migraines   . Anxiety   . Depression     Past Surgical History:  Procedure Laterality Date  . CESAREAN SECTION  2010   x 1 at 36 wks in [place]  . CESAREAN SECTION  February 13, 2012  . CHROMOPERTUBATION  10/26/2015   Procedure: ATTEMPTED CHROMOPERTUBATION;  Surgeon: Crawford Givens, MD;  Location: [facility];  Service: Gynecology;;  . LAPAROSCOPIC LYSIS OF ADHESIONS  10/26/2015   Procedure: LAPAROSCOPIC LYSIS OF ADHESIONS;  Surgeon: Crawford Givens, MD;  Location: [facility];  Service: Gynecology;;  . LAPAROSCOPY N/A 10/26/2015   Procedure:  LAPAROSCOPY OPERATIVE WITH REMOVAL OF MISPLACED FILSHE CLIP;  Surgeon: Crawford Givens, MD;  Location: [facility];  Service: Gynecology;  Laterality: N/A;  . SVD  2003   x 1 in [place]  . TUBAL LIGATION    . WISDOM TOOTH EXTRACTION      OB History    Gravida  3   Para  3   Term  1   Preterm  2   AB  0   Living  3     SAB  0   TAB  0   Ectopic  0   Multiple  0   Live Births  3            Home Medications    Prior to Admission medications   Medication Sig Start Date End Date Taking? Authorizing Provider  atomoxetine (STRATTERA) 80 MG capsule Take by mouth. 09/17/19  Yes [provider]  Carbinoxamine Maleate (RYVENT) 6 MG TABS TAKE 1 TABLET BY MOUTH EVERY DAY FOR ALLERGIES 08/27/19  Yes [provider]  DULoxetine (CYMBALTA) 30 MG capsule Take 1 capsule by mouth at bedtime. 08/27/19  Yes [provider]  DULoxetine (CYMBALTA) 60 MG capsule Take by mouth. 08/27/19  Yes [provider]  Erenumab-aooe (AIMOVIG) 140 MG/ML SOAJ Aimovig Autoinjector 140 mg/mL subcutaneous auto-injector  INJECT 1 ML BELOW THE SKIN ONCE A MONTH 05/14/18  Yes [provider]  metoprolol succinate (TOPROL-XL) 25 MG 24 hr tablet Take 1 tablet by mouth 2 (two) times daily. 08/27/19 05/28/20 Yes [provider]  norethindrone (AYGESTIN) 5 MG tablet Take by mouth. 05/07/20 05/28/20 Yes [provider]  Topiramate ER (TROKENDI XR) 200 MG CP24 Take 1 capsule by mouth daily. 08/27/19 05/28/20 Yes [provider]  ACCU-CHEK GUIDE test strip USE 1 strip 2 TIMES DAILY BEFORE MEALS 04/23/20   [provider]  Accu-Chek Softclix Lancets lancets 2 (two) times daily. 04/24/20   [provider]  AIMOVIG 140 MG/ML SOAJ Inject 1,140 mg into the skin every 30 (thirty) days.  05/14/18   [provider]  atomoxetine (STRATTERA) 80 MG capsule Take 80 mg by mouth daily.     [provider]  benzonatate (TESSALON PERLES) 100  MG capsule Take 1 capsule (100 mg total) by mouth 2 (two) times daily as needed for cough. 05/27/20 05/27/21  Rudolpho Sevin, NP  Blood Glucose Monitoring Suppl (ACCU-CHEK GUIDE) w/Device KIT See admin instructions. 04/23/20   [provider]  butalbital-acetaminophen-caffeine (FIORICET) 50-325-40 MG tablet Take 1-2 tablets by mouth every 6 (six) hours as needed for headache. 09/10/19 09/09/20  Lucrezia Starch, MD  diclofenac (VOLTAREN) 75 MG EC tablet Take 75 mg by mouth 2 (two) times daily. 04/23/20   [provider]  DULoxetine (CYMBALTA) 30 MG capsule Take 30 mg by mouth daily. Pt takes with the cymbalta 58m for a total of 90 mg    [provider]  DULoxetine (CYMBALTA) 60 MG capsule Take 60 mg by mouth daily. Pt takes with cymbalta 30 mg for a total of 90 mg    [provider]  EPINEPHrine 0.3 mg/0.3 mL IJ SOAJ injection SMARTSIG:1 Syringe(s) IM PRN 01/14/20   [provider]  ipratropium (ATROVENT) 0.03 % nasal spray Place 2 sprays into both nostrils every 12 (twelve) hours. 05/28/20   Athalene Kolle, PMyrene Galas MD  predniSONE (DELTASONE) 20 MG tablet Take 1 tablet (20 mg total) by mouth daily for 3 days. 05/28/20 05/31/20  Iline Buchinger,Myrene Galas MD  Cetirizine HCl 10 MG CAPS Take 1 capsule (10 mg total) by mouth daily for 10 days. Patient not taking: Reported on 06/22/2019 02/11/19 08/18/19  Wieters, HElesa Hacker PA-C  Dextromethorphan HBr 10 MG/5ML SYRP 10 ml qhs prn cough at night time. Patient not taking: Reported on 06/22/2019 02/04/19 08/18/19  Rodriguez-Southworth, SSunday Spillers PA-C  fluticasone ([facility] 50 MCG/ACT nasal spray Place 1 spray into both nostrils daily. 05/27/20 05/28/20  SRudolpho Sevin NP  levocetirizine (XYZAL) 5 MG tablet Take 5 mg by mouth daily. 05/18/20 05/28/20  [provider]  montelukast (SINGULAIR) 10 MG tablet Take 10 mg by mouth daily. 05/18/20 05/28/20  [provider]  promethazine (PHENERGAN) 25 MG tablet Take 1 tablet (25 mg total) by  mouth every 6 (six) hours as needed for nausea or vomiting. 08/18/19 05/28/20  McDonald, MLaymond Purser PA-C    Family History Family History  Problem Relation Age of Onset  . Cancer Mother   . CAD Mother   . Hypertension Sister   . Sickle cell trait Daughter   . Asthma Son   . Asthma Paternal Grandmother   . Pseudochol deficiency Neg Hx   . Malignant hyperthermia Neg Hx   . Anesthesia problems Neg Hx   . Hypotension Neg Hx     Social History Social History  Tobacco Use  . Smoking status: Current Every Day Smoker    Packs/day: 0.10    Years: 18.00    Pack years: 1.80    Types: Cigarettes  . Smokeless tobacco: Never Used  Vaping Use  . Vaping Use: Former  Substance Use Topics  . Alcohol use: Yes    Comment: Rare   . Drug use: Not Currently    Types: Marijuana     Allergies   Other and Shrimp [shellfish allergy]   Review of Systems Review of Systems  Constitutional: Negative.   HENT: Negative.   Respiratory: Positive for cough and shortness of breath. Negative for chest tightness and wheezing.   Cardiovascular: Negative for chest pain.  Gastrointestinal: Negative.   Genitourinary: Negative.   Musculoskeletal: Negative.   Neurological: Negative.      Physical Exam Triage Vital Signs ED Triage Vitals  Enc Vitals Group     BP 05/28/20 1513 121/80     Pulse Rate 05/28/20 1513 89     Resp 05/28/20 1513 (!) 21     Temp 05/28/20 1513 98.6 F (37 C)     Temp Source 05/28/20 1513 Oral     SpO2 05/28/20 1513 100 %     Weight --      Height --      Head Circumference --      Peak Flow --      Pain Score 05/28/20 1510 8     Pain Loc --      Pain Edu? --      Excl. in [place]? --    No data found.  Updated Vital Signs BP 121/80 (BP Location: Left Arm)   Pulse 89   Temp 98.6 F (37 C) (Oral)   Resp (!) 21   SpO2 100%   Visual Acuity Right Eye Distance:   Left Eye Distance:   Bilateral Distance:    Right Eye Near:   Left Eye Near:    Bilateral Near:      Physical Exam Vitals and nursing note reviewed.  Constitutional:      General: She is not in acute distress.    Appearance: She is well-developed. She is not ill-appearing.     Interventions: She is not intubated. Neck:     Thyroid: No thyromegaly.  Cardiovascular:     Rate and Rhythm: Normal rate and regular rhythm.  Pulmonary:     Effort: Pulmonary effort is normal. No accessory muscle usage or respiratory distress. She is not intubated.     Breath sounds: Normal breath sounds. No decreased breath sounds, wheezing, rhonchi or rales.  Abdominal:     General: Bowel sounds are normal.     Palpations: Abdomen is soft.  Musculoskeletal:        General: Normal range of motion.     Cervical back: Normal range of motion.  Lymphadenopathy:     Cervical: No cervical adenopathy.  Neurological:     Mental Status: She is alert.      UC Treatments / Results  Labs (all labs ordered are listed, but only abnormal results are displayed) Labs Reviewed - No data to display  EKG   Radiology No results found.  Procedures Procedures (including critical care time)  Medications Ordered in UC Medications - No data to display  Initial Impression / Assessment and Plan / UC Course  I have reviewed the triage vital signs and the nursing notes.  Pertinent labs & imaging results that were available during my care  of the patient were reviewed by me and considered in my medical decision making (see chart for details).     1.  Acute bronchitis: Ipratropium nasal spray Short course of steroids Tobacco cessation counseling given.  Time spent on counseling is less than 10 minutes Return precautions given No indication for imaging Final Clinical Impressions(s) / UC Diagnoses   Final diagnoses:  Acute bronchitis, unspecified organism   Discharge Instructions   None    ED Prescriptions    Medication Sig Dispense Auth. Provider   ipratropium (ATROVENT) 0.03 % nasal spray Place 2  sprays into both nostrils every 12 (twelve) hours. 30 mL Baine Decesare, Myrene Galas, MD   predniSONE (DELTASONE) 20 MG tablet Take 1 tablet (20 mg total) by mouth daily for 3 days. 3 tablet Naviah Belfield, Myrene Galas, MD     PDMP not reviewed this encounter.   Chase Picket, MD 05/28/20 [phone]

## 2020-06-13 ENCOUNTER — Encounter (HOSPITAL_COMMUNITY): Payer: Self-pay

## 2020-06-13 ENCOUNTER — Ambulatory Visit (HOSPITAL_COMMUNITY)
Admission: EM | Admit: 2020-06-13 | Discharge: 2020-06-13 | Disposition: A | Discharge status: Home / Self Care | Payer: Medicaid Other | Attending: Family Medicine | Admitting: Family Medicine

## 2020-06-13 ENCOUNTER — Other Ambulatory Visit: Payer: Self-pay

## 2020-06-13 DIAGNOSIS — Z791 Long term (current) use of non-steroidal anti-inflammatories (NSAID): Secondary | ICD-10-CM | POA: Diagnosis not present

## 2020-06-13 DIAGNOSIS — F329 Major depressive disorder, single episode, unspecified: Secondary | ICD-10-CM | POA: Insufficient documentation

## 2020-06-13 DIAGNOSIS — I10 Essential (primary) hypertension: Secondary | ICD-10-CM | POA: Diagnosis not present

## 2020-06-13 DIAGNOSIS — F419 Anxiety disorder, unspecified: Secondary | ICD-10-CM | POA: Insufficient documentation

## 2020-06-13 DIAGNOSIS — Z793 Long term (current) use of hormonal contraceptives: Secondary | ICD-10-CM | POA: Diagnosis not present

## 2020-06-13 DIAGNOSIS — R1110 Vomiting, unspecified: Secondary | ICD-10-CM | POA: Diagnosis not present

## 2020-06-13 DIAGNOSIS — F17210 Nicotine dependence, cigarettes, uncomplicated: Secondary | ICD-10-CM | POA: Diagnosis not present

## 2020-06-13 DIAGNOSIS — M67432 Ganglion, left wrist: Secondary | ICD-10-CM | POA: Insufficient documentation

## 2020-06-13 DIAGNOSIS — Z3202 Encounter for pregnancy test, result negative: Secondary | ICD-10-CM | POA: Diagnosis not present

## 2020-06-13 DIAGNOSIS — M419 Scoliosis, unspecified: Secondary | ICD-10-CM | POA: Insufficient documentation

## 2020-06-13 DIAGNOSIS — Z8249 Family history of ischemic heart disease and other diseases of the circulatory system: Secondary | ICD-10-CM | POA: Diagnosis not present

## 2020-06-13 DIAGNOSIS — Z20822 Contact with and (suspected) exposure to covid-19: Secondary | ICD-10-CM | POA: Diagnosis not present

## 2020-06-13 DIAGNOSIS — R112 Nausea with vomiting, unspecified: Secondary | ICD-10-CM

## 2020-06-13 DIAGNOSIS — Z79899 Other long term (current) drug therapy: Secondary | ICD-10-CM | POA: Insufficient documentation

## 2020-06-13 DIAGNOSIS — F909 Attention-deficit hyperactivity disorder, unspecified type: Secondary | ICD-10-CM | POA: Diagnosis not present

## 2020-06-13 DIAGNOSIS — R109 Unspecified abdominal pain: Secondary | ICD-10-CM | POA: Diagnosis not present

## 2020-06-13 LAB — POCT URINALYSIS DIP (DEVICE)
Bilirubin Urine: NEGATIVE
Glucose, UA: NEGATIVE mg/dL
Ketones, ur: NEGATIVE mg/dL
Leukocytes,Ua: NEGATIVE
Nitrite: NEGATIVE
Protein, ur: NEGATIVE mg/dL
Specific Gravity, Urine: >=1.03 (ref 1.005–1.030)
Urobilinogen, UA: 0.2 mg/dL (ref 0.0–1.0)
pH: 5.5 (ref 5.0–8.0)

## 2020-06-13 LAB — POC URINE PREG, ED: Preg Test, Ur: NEGATIVE

## 2020-06-13 LAB — SARS CORONAVIRUS 2 (TAT 6-24 HRS): SARS Coronavirus 2: NEGATIVE

## 2020-06-13 MED ORDER — PROMETHAZINE HCL 25 MG PO TABS: 25.0000 mg | ORAL_TABLET | Freq: Four times a day (QID) | ORAL | 0 refills | Status: DC | PRN

## 2020-06-13 NOTE — Discharge Instructions (Signed)
Take Phenergan as prescribed for nausea Continue with small sips of fluid, reintroduce bland foods slowly Recommend seeing orthopedics for ganglion cyst to left hand.

## 2020-06-13 NOTE — ED Provider Notes (Addendum)
[facility]    CSN: [id number] Arrival date & time: 06/13/20  1034      History   Chief Complaint Chief Complaint  Patient presents with  . Abdominal Pain  . Emesis    HPI Penny [name] is a 39 y.o. female.   Pt complains of nausea that started yesterday.  She reports one episode of vomiting yesterday.  Denies diarrhea.  Complains of left lower abdominal discomfort.  Denies fever, chills, dysuria, hematuria.  She has taken nothing for the sx.  She reports drinking fluids today, decreased appetite.  Reports eating coleslaw last night without difficulty.  Pt requests work note.  Pt also complains of a small lump to the dorsal aspect of her left wrist that does occassionaly cause pain.  She denies injury or trauma.        Past Medical History:  Diagnosis Date  . ADHD (attention deficit hyperactivity disorder)   . Anxiety    no meds  . Chronic back pain   . Depression    no meds  . Essential hypertension    a. Dx 06/2016.  . Migraines    last one over 1 month ago  . Overweight   . Palpitations - Sinus Tachycardia    a. 12/2016 Echo: E f55-60%, no rwma, nl LA/RA sizes.  . Placenta previa    w/ 2 pregnancies.  . Preterm labor    c/s at 36 wks  . Scoliosis   . Sinus tachycardia     Patient Active Problem List   Diagnosis Date Noted  . Uterine fibroid 01/23/2016  . Right groin pain 01/23/2016  . History of C-section 01/23/2016  . Migraines   . Anxiety   . Depression     Past Surgical History:  Procedure Laterality Date  . CESAREAN SECTION  2010   x 1 at 36 wks in [place]  . CESAREAN SECTION  February 13, 2012  . CHROMOPERTUBATION  10/26/2015   Procedure: ATTEMPTED CHROMOPERTUBATION;  Surgeon: Crawford Givens, MD;  Location: [facility];  Service: Gynecology;;  . LAPAROSCOPIC LYSIS OF ADHESIONS  10/26/2015   Procedure: LAPAROSCOPIC LYSIS OF ADHESIONS;  Surgeon: Crawford Givens, MD;  Location: [facility];  Service: Gynecology;;  . LAPAROSCOPY N/A 10/26/2015    Procedure: LAPAROSCOPY OPERATIVE WITH REMOVAL OF MISPLACED FILSHE CLIP;  Surgeon: Crawford Givens, MD;  Location: [facility];  Service: Gynecology;  Laterality: N/A;  . SVD  2003   x 1 in [place]  . TUBAL LIGATION    . WISDOM TOOTH EXTRACTION      OB History    Gravida  3   Para  3   Term  1   Preterm  2   AB  0   Living  3     SAB  0   TAB  0   Ectopic  0   Multiple  0   Live Births  3            Home Medications    Prior to Admission medications   Medication Sig Start Date End Date Taking? Authorizing Provider  atomoxetine (STRATTERA) 80 MG capsule Take 80 mg by mouth daily.    Yes [provider]  gabapentin (NEURONTIN) 800 MG tablet Take 800 mg by mouth 3 (three) times daily.   Yes [provider]  tiZANidine (ZANAFLEX) 4 MG tablet Take 4 mg by mouth every 6 (six) hours as needed for muscle spasms.   Yes [provider]  ACCU-CHEK GUIDE  test strip USE 1 strip 2 TIMES DAILY BEFORE MEALS 04/23/20   [provider]  Accu-Chek Softclix Lancets lancets 2 (two) times daily. 04/24/20   [provider]  AIMOVIG 140 MG/ML SOAJ Inject 1,140 mg into the skin every 30 (thirty) days.  05/14/18   [provider]  atomoxetine (STRATTERA) 80 MG capsule Take by mouth. 09/17/19   [provider]  benzonatate (TESSALON PERLES) 100 MG capsule Take 1 capsule (100 mg total) by mouth 2 (two) times daily as needed for cough. 05/27/20 05/27/21  Rudolpho Sevin, NP  Blood Glucose Monitoring Suppl (ACCU-CHEK GUIDE) w/Device KIT See admin instructions. 04/23/20   [provider]  butalbital-acetaminophen-caffeine (FIORICET) 50-325-40 MG tablet Take 1-2 tablets by mouth every 6 (six) hours as needed for headache. 09/10/19 09/09/20  Lucrezia Starch, MD  Carbinoxamine Maleate (RYVENT) 6 MG TABS TAKE 1 TABLET BY MOUTH EVERY DAY FOR ALLERGIES 08/27/19   [provider]  diclofenac (VOLTAREN) 75 MG EC tablet Take 75 mg by mouth  2 (two) times daily. 04/23/20   [provider]  DULoxetine (CYMBALTA) 30 MG capsule Take 30 mg by mouth daily. Pt takes with the cymbalta 34m for a total of 90 mg    [provider]  DULoxetine (CYMBALTA) 30 MG capsule Take 1 capsule by mouth at bedtime. 08/27/19   [provider]  DULoxetine (CYMBALTA) 60 MG capsule Take 60 mg by mouth daily. Pt takes with cymbalta 30 mg for a total of 90 mg    [provider]  DULoxetine (CYMBALTA) 60 MG capsule Take by mouth. 08/27/19   [provider]  EPINEPHrine 0.3 mg/0.3 mL IJ SOAJ injection SMARTSIG:1 Syringe(s) IM PRN 01/14/20   [provider]  Erenumab-aooe (AIMOVIG) 140 MG/ML SOAJ Aimovig Autoinjector 140 mg/mL subcutaneous auto-injector  INJECT 1 ML BELOW THE SKIN ONCE A MONTH 05/14/18   [provider]  ipratropium (ATROVENT) 0.03 % nasal spray Place 2 sprays into both nostrils every 12 (twelve) hours. 05/28/20   Lamptey, PMyrene Galas MD  Cetirizine HCl 10 MG CAPS Take 1 capsule (10 mg total) by mouth daily for 10 days. Patient not taking: Reported on 06/22/2019 02/11/19 08/18/19  Wieters, HElesa Hacker PA-C  Dextromethorphan HBr 10 MG/5ML SYRP 10 ml qhs prn cough at night time. Patient not taking: Reported on 06/22/2019 02/04/19 08/18/19  Rodriguez-Southworth, SSunday Spillers PA-C  fluticasone ([facility] 50 MCG/ACT nasal spray Place 1 spray into both nostrils daily. 05/27/20 05/28/20  SRudolpho Sevin NP  levocetirizine (XYZAL) 5 MG tablet Take 5 mg by mouth daily. 05/18/20 05/28/20  [provider]  metoprolol succinate (TOPROL-XL) 25 MG 24 hr tablet Take 1 tablet by mouth 2 (two) times daily. 08/27/19 05/28/20  [provider]  montelukast (SINGULAIR) 10 MG tablet Take 10 mg by mouth daily. 05/18/20 05/28/20  [provider]  norethindrone (AYGESTIN) 5 MG tablet Take by mouth. 05/07/20 05/28/20  [provider]  promethazine (PHENERGAN) 25 MG tablet Take 1 tablet (25 mg total) by mouth  every 6 (six) hours as needed for nausea or vomiting. 08/18/19 05/28/20  McDonald, [name], PA-C  Topiramate ER (TROKENDI XR) 200 MG CP24 Take 1 capsule by mouth daily. 08/27/19 05/28/20  [provider]    Family History Family History  Problem Relation Age of Onset  . Cancer Mother   . CAD Mother   . Hypertension Sister   . Sickle cell trait Daughter   . Asthma Son   . Asthma Paternal  Grandmother   . Pseudochol deficiency Neg Hx   . Malignant hyperthermia Neg Hx   . Anesthesia problems Neg Hx   . Hypotension Neg Hx     Social History Social History   Tobacco Use  . Smoking status: Current Every Day Smoker    Packs/day: 0.10    Years: 18.00    Pack years: 1.80    Types: Cigarettes  . Smokeless tobacco: Never Used  Vaping Use  . Vaping Use: Former  Substance Use Topics  . Alcohol use: Yes    Comment: Rare   . Drug use: Not Currently    Types: Marijuana     Allergies   Other and Shrimp [shellfish allergy]   Review of Systems Review of Systems  Constitutional: Negative for chills and fever.  HENT: Negative for ear pain and sore throat.   Eyes: Negative for pain and visual disturbance.  Respiratory: Negative for cough and shortness of breath.   Cardiovascular: Negative for chest pain and palpitations.  Gastrointestinal: Positive for nausea and vomiting. Negative for abdominal pain, anal bleeding and diarrhea.  Genitourinary: Negative for difficulty urinating, dysuria, frequency, hematuria and urgency.  Musculoskeletal: Negative for arthralgias and back pain.  Skin: Negative for color change and rash.  Neurological: Negative for seizures and syncope.  All other systems reviewed and are negative.    Physical Exam Triage Vital Signs ED Triage Vitals  Enc Vitals Group     BP 06/13/20 1122 124/68     Pulse Rate 06/13/20 1122 77     Resp 06/13/20 1122 16     Temp 06/13/20 1122 98.4 F (36.9 C)     Temp Source 06/13/20 1122 Oral     SpO2 06/13/20 1122  100 %     Weight --      Height --      Head Circumference --      Peak Flow --      Pain Score 06/13/20 1130 6     Pain Loc --      Pain Edu? --      Excl. in [place]? --    No data found.  Updated Vital Signs BP 124/68 (BP Location: Left Arm)   Pulse 77   Temp 98.4 F (36.9 C) (Oral)   Resp 16   SpO2 100%   Visual Acuity Right Eye Distance:   Left Eye Distance:   Bilateral Distance:    Right Eye Near:   Left Eye Near:    Bilateral Near:     Physical Exam Vitals and nursing note reviewed.  Constitutional:      General: She is not in acute distress.    Appearance: She is well-developed.  HENT:     Head: Normocephalic and atraumatic.  Eyes:     Conjunctiva/sclera: Conjunctivae normal.  Cardiovascular:     Rate and Rhythm: Normal rate and regular rhythm.     Heart sounds: No murmur heard.   Pulmonary:     Effort: Pulmonary effort is normal. No respiratory distress.     Breath sounds: Normal breath sounds.  Abdominal:     Palpations: Abdomen is soft.     Tenderness: There is no abdominal tenderness. There is no right CVA tenderness, left CVA tenderness, guarding or rebound. Negative signs include Murphy's sign.  Musculoskeletal:     Cervical back: Neck supple.     Comments: Small non tender cyst to dorsum of left wrist consistent with ganglion cyst.   Skin:    General:  Skin is warm and dry.  Neurological:     Mental Status: She is alert.      UC Treatments / Results  Labs (all labs ordered are listed, but only abnormal results are displayed) Labs Reviewed  POCT URINALYSIS DIP (DEVICE) - Abnormal; Notable for the following components:      Result Value   Hgb urine dipstick SMALL (*)    All other components within normal limits  SARS CORONAVIRUS 2 (TAT 6-24 HRS)  POC URINE PREG, ED    EKG   Radiology No results found.  Procedures Procedures (including critical care time)  Medications Ordered in UC Medications - No data to display  Initial  Impression / Assessment and Plan / UC Course  I have reviewed the triage vital signs and the nursing notes.  Pertinent labs & imaging results that were available during my care of the patient were reviewed by me and considered in my medical decision making (see chart for details).     Pt with nausea/vomiting; most likely viral.  No signs of acute abdomen on exam. Pt afebrile with vital WNL.  Pt reports Zofran has provided no relief in the past, request Phenergan.  Work note provided per pt request.  She will continue to push fluids, small sips.  Gradually reintroduce food as tolerated.  Return precautions discussed.  Pt with ganglion cyst to dorsum of left wrist.  Information for the [place] given.  Pt to call to schedule appointment  Final Clinical Impressions(s) / UC Diagnoses   Final diagnoses:  None   Discharge Instructions   None    ED Prescriptions    None     PDMP not reviewed this encounter.   Konrad Felix, PA-C 06/13/20 1226    [address] [place] 06/13/20 1238

## 2020-06-13 NOTE — ED Triage Notes (Signed)
Pt c/o acute cough, mid-epigastric pain, n/v since yesterday afternoon. Pt reports one episode of emesis yesterday, none today, but nausea persists. Tolerating sips clear liquid. Also tolerated "cole slaw" last night. Headache occurred two days ago, now resolved.  Denies fever, chills, congestion, runny nose. Pt is prescribed home glucose testing; last testing completed on 06/08/2020 "99" result.   Also c/o pain/swelling to left dorsal wrist; approx 2 cm diameter.

## 2020-07-06 ENCOUNTER — Ambulatory Visit: Payer: Medicaid Other | Admitting: Physical Therapy

## 2020-07-13 ENCOUNTER — Telehealth: Payer: Self-pay | Admitting: Nurse Practitioner

## 2020-07-13 ENCOUNTER — Other Ambulatory Visit: Payer: Self-pay

## 2020-07-13 ENCOUNTER — Ambulatory Visit
Payer: Medicaid Other | Attending: Student in an Organized Health Care Education/Training Program | Admitting: Physical Therapy

## 2020-07-13 ENCOUNTER — Encounter: Payer: Self-pay | Admitting: Physical Therapy

## 2020-07-13 DIAGNOSIS — R1084 Generalized abdominal pain: Secondary | ICD-10-CM | POA: Diagnosis present

## 2020-07-13 DIAGNOSIS — R252 Cramp and spasm: Secondary | ICD-10-CM | POA: Diagnosis present

## 2020-07-13 DIAGNOSIS — M6281 Muscle weakness (generalized): Secondary | ICD-10-CM | POA: Diagnosis not present

## 2020-07-13 NOTE — Telephone Encounter (Signed)
°   Penny Penny [name] DOB: 19-Sep-1981 MRN: [id number]   RIDER WAIVER AND RELEASE OF LIABILITY  For purposes of improving physical access to our facilities, [facility] is pleased to partner with third parties to provide [facility] patients or other authorized individuals the option of convenient, on-demand ground transportation services (the [employer]) through use of the technology service that enables users to request on-demand ground transportation from independent third-party providers.  By opting to use and accept these [employer], I, the undersigned, hereby agree on behalf of myself, and on behalf of any minor child using the [employer] for whom I am the parent or legal guardian, as follows:  1. Government social research officer provided to me are provided by independent third-party transportation providers who are not [employer] or employees and who are unaffiliated with [employer]. 2. Penny Penny [name] is neither a transportation carrier nor a common or public carrier. 3. Penny [name] has no control over the quality or safety of the transportation that occurs as a result of the [employer]. 4. Penny [name] cannot guarantee that any third-party transportation provider will complete any arranged transportation service. 5. Penny Penny [name] makes no representation, warranty, or guarantee regarding the reliability, timeliness, quality, safety, suitability, or availability of any of the Transport Services or that they will be error free. 6. I fully understand that traveling by vehicle involves risks and dangers of serious bodily injury, including permanent disability, paralysis, and death. I agree, on behalf of myself and on behalf of any minor child using the Transport Services for whom I am the parent or legal guardian, that the entire risk arising out of my use of the [employer] remains solely with me, to the maximum extent permitted under applicable law. 7. The Penny Penny [name] are provided as is and as available. Penny Penny [name] disclaims all representations and warranties, express, implied or statutory, not expressly set out in these terms, including the implied warranties of merchantability and fitness for a particular purpose. 8. I hereby waive and release Penny [name], its agents, employees, officers, directors, representatives, insurers, attorneys, assigns, successors, subsidiaries, and affiliates from any and all past, present, or future claims, demands, liabilities, actions, causes of action, or suits of any kind directly or indirectly arising from acceptance and use of the [employer]. 9. I further waive and release Penny Penny [name] and its affiliates from all present and future liability and responsibility for any injury or death to persons or damages to property caused by or related to the use of the [employer]. 10. I have read this Waiver and Release of Liability, and I understand the terms used in it and their legal significance. This Waiver is freely and voluntarily given with the understanding that my right (as well as the right of any minor child for whom I am the parent or legal guardian using the [employer]) to legal recourse against Penny Penny [name] in connection with the [employer] is knowingly surrendered in return for use of these services.   I attest that I read the consent document to Penny Penny [name], gave Penny Penny [name] the opportunity to ask questions and answered the questions asked (if any). I affirm that Penny Penny [name] then provided consent for she's participation in this program.      Penny Penny [name]

## 2020-07-13 NOTE — Therapy (Signed)
[facility] [address] [place], [place], [address] Phone: [phone]   Fax:  [phone]  Physical Therapy Evaluation  Patient Details  Name: Penny [name] MRN: [id number] Date of Birth: 1981-03-20 Referring Provider (PT): Dr. Tommie Raymond   Encounter Date: 07/13/2020   PT End of Session - 07/13/20 1424    Visit Number 1    Date for PT Re-Evaluation 10/05/20    Authorization Type medicaid    PT Start Time 1400    PT Stop Time 1440    PT Time Calculation (min) 40 min    Activity Tolerance Patient tolerated treatment well    Behavior During Therapy [facility] for tasks assessed/performed           Past Medical History:  Diagnosis Date  . ADHD (attention deficit hyperactivity disorder)   . Anxiety    no meds  . Chronic back pain   . Depression    no meds  . Essential hypertension    a. Dx 06/2016.  . Migraines    last one over 1 month ago  . Overweight   . Palpitations - Sinus Tachycardia    a. 12/2016 Echo: E f55-60%, no rwma, nl LA/RA sizes.  . Placenta previa    w/ 2 pregnancies.  . Preterm labor    c/s at 36 wks  . Scoliosis   . Sinus tachycardia     Past Surgical History:  Procedure Laterality Date  . CESAREAN SECTION  2010   x 1 at 36 wks in [place]  . CESAREAN SECTION  February 13, 2012  . CHROMOPERTUBATION  10/26/2015   Procedure: ATTEMPTED CHROMOPERTUBATION;  Surgeon: Crawford Givens, MD;  Location: [facility];  Service: Gynecology;;  . LAPAROSCOPIC LYSIS OF ADHESIONS  10/26/2015   Procedure: LAPAROSCOPIC LYSIS OF ADHESIONS;  Surgeon: Crawford Givens, MD;  Location: [facility];  Service: Gynecology;;  . LAPAROSCOPY N/A 10/26/2015   Procedure: LAPAROSCOPY OPERATIVE WITH REMOVAL OF MISPLACED FILSHE CLIP;  Surgeon: Crawford Givens, MD;  Location: [facility];  Service: Gynecology;  Laterality: N/A;  . SVD  2003   x 1 in [place]  . TUBAL LIGATION    . WISDOM TOOTH EXTRACTION      There were no vitals filed  for this visit.    Subjective Assessment - 07/13/20 1405    Subjective Patient reports her pain started when she had her tubes tied on 02/13/2012. In 2016 her pain was so bad and had exploratory surgery and one of the clips fell off. The pain is staying the same. Dealing with pain for 8 years.    Patient Stated Goals reduce pain    Currently in Pain? Yes    Pain Score 8     Pain Location Abdomen    Pain Orientation Right;Lower    Pain Descriptors / Indicators Constant;Aching    Pain Type Chronic pain    Pain Onset More than a month ago    Pain Frequency Intermittent    Aggravating Factors  not sure what causes the pain    Pain Relieving Factors Medicine    Multiple Pain Sites No              [facility] PT Assessment - 07/13/20 0001      Assessment   Medical Diagnosis R10.2 Pelvic pain in female    Referring Provider (PT) Dr. Tommie Raymond    Onset Date/Surgical Date 02/13/12    Prior Therapy none  Precautions   Precautions None      Restrictions   Weight Bearing Restrictions No      Balance Screen   Has the patient fallen in the past 6 months No    Has the patient had a decrease in activity level because of a fear of falling?  No    Is the patient reluctant to leave their home because of a fear of falling?  No      Home Ecologist residence      Prior Function   Level of Independence Independent      Cognition   Overall Cognitive Status Within Functional Limits for tasks assessed      Posture/Postural Control   Posture/Postural Control Postural limitations    Posture Comments scoliosis      ROM / Strength   AROM / PROM / Strength AROM;PROM;Strength      AROM   Lumbar Flexion decreased by 25% with pain    Lumbar Extension decreased by 25%    Lumbar - Right Side Bend decreased by 25% with pain      Strength   Overall Strength Comments decreased abdominal strength    Right Hip Flexion 4-/5    Right Hip Extension 3+/5     Right Hip External Rotation  3+/5    Right Hip ABduction 3/5    Right Hip ADduction 4-/5      Palpation   Palpation comment c-section scar has slight fascial tightness; tenderness located on the lower abdomen and left lateral abdomen;                       Objective measurements completed on examination: See above findings.     Pelvic Floor Special Questions - 07/13/20 0001    Prior Pregnancies Yes    Number of Pregnancies 3    Number of C-Sections 2    Number of Vaginal Deliveries 1    Currently Sexually Active Yes    Is this Painful Yes    Marinoff Scale pain interrupts completion    Urinary Leakage No    Fecal incontinence No   constipation   Skin Integrity Intact    Pelvic Floor Internal Exam Patient confirms identification and approves PT to assess pelvic floor and treatment    Exam Type Vaginal    Palpation tightness in the left levator ani    Strength weak squeeze, no lift                    PT Education - 07/13/20 1707    Education Details gave patient information on pelvic floor meditation    Person(s) Educated Patient    Methods Explanation    Comprehension Verbalized understanding            PT Short Term Goals - 07/13/20 1433      PT SHORT TERM GOAL #1   Title pt to be I with intial HEP     Baseline ---    Time 4    Period Weeks    Status New    Target Date 08/10/20      PT SHORT TERM GOAL #2   Title --    Baseline --      PT SHORT TERM GOAL #3   Title --    Baseline --             PT Long Term Goals - 07/13/20 1710  PT LONG TERM GOAL #1   Title independent with advanced HEP    Baseline not educated yet    Time 32    Period Weeks    Status New    Target Date 10/05/20      PT LONG TERM GOAL #2   Title understand ways to manage pain with meditation, breathing, and conservation of energy    Baseline not educated yet    Time 12    Period Weeks    Status New    Target Date 10/05/20      PT LONG TERM  GOAL #3   Title able to brace her abdomen during daily tasks to reduce the pain and improve her function    Baseline not able to brace her abdomen due to lack of tone    Time 12    Period Weeks    Status New    Target Date 10/05/20      PT LONG TERM GOAL #4   Title able to start a walking program for 15 mintues a day due to pain decrease </= 5/10    Baseline pain level is 8/10    Time 12    Period Weeks    Status New    Target Date 10/05/20      PT LONG TERM GOAL #5   Title right hip strength >/= 4/5 so she is able to tolerate her daily tasks with pain decreased </= 5/10    Baseline pain level 5/10; right hip strength is 3/5    Time 12    Period Weeks    Status New    Target Date 10/05/20                  Plan - 07/13/20 1440    Clinical Impression Statement Patient is a 39 year old female with chronic low abdominal pain since 02/13/2012 when she had a tubal ligation. Patient reports her pain level is 8/10 intermittently. Patient does not know what causes her pain and uses medication for the pain. Lumbar extension, flexion, and right sidebending decreased by 25% with pain. Patient has weakness in right hip averaging 3/5. Her abdominal strength is 1/5 and pelvic floor strength is 2/5. Patient has tightness in the left levator ani. Patient has palpable tenderness located on the lower abdominal area and left lateral abdominal. Patient will benefit from skilled therapy to understand how to manage pain and improve overall strength.    Personal Factors and Comorbidities Comorbidity 3+;Age;Fitness;Sex    Comorbidities 2x cesaren section; exploratory laparotomy    Examination-Activity Limitations Toileting;Locomotion Level;Squat;Stairs;Stand;Sleep;Carry;Dressing;Bed Mobility    Examination-Participation Restrictions Community Activity;Interpersonal Relationship;Cleaning    Stability/Clinical Decision Making Evolving/Moderate complexity    Clinical Decision Making Low    Rehab  Potential Good    PT Frequency 1x / week    PT Duration 12 weeks    PT Treatment/Interventions Biofeedback;Cryotherapy;Electrical Stimulation;Moist Heat;Ultrasound;Neuromuscular re-education;Therapeutic exercise;Therapeutic activities;Patient/family education;Manual techniques;Dry needling;Taping;Spinal Manipulations    PT Next Visit Plan stretches for pelvic floor and hip flexors; fascial work to the lower abdomen; abdominal breathing; ways to manage pain    Consulted and Agree with Plan of Care Patient           Patient will benefit from skilled therapeutic intervention in order to improve the following deficits and impairments:  Decreased range of motion, Increased fascial restricitons, Decreased endurance, Increased muscle spasms, Pain, Decreased strength, Decreased mobility  Visit Diagnosis: Muscle weakness (generalized) - Plan: PT plan of care cert/re-cert  Cramp and spasm - Plan: PT plan of care cert/re-cert  Generalized abdominal pain - Plan: PT plan of care cert/re-cert     Problem List Patient Active Problem List   Diagnosis Date Noted  . Uterine fibroid 01/23/2016  . Right groin pain 01/23/2016  . History of C-section 01/23/2016  . Migraines   . Anxiety   . Depression     Earlie Counts, PT 07/13/20 5:18 PM   [facility] [address] [place], [place], [address] Phone: [phone]   Fax:  [phone]  Name: Penny [name] MRN: [id number] Date of Birth: 03-31-1981

## 2020-07-23 ENCOUNTER — Ambulatory Visit
Payer: Medicaid Other | Attending: Student in an Organized Health Care Education/Training Program | Admitting: Physical Therapy

## 2020-07-23 ENCOUNTER — Encounter: Payer: Self-pay | Admitting: Physical Therapy

## 2020-07-23 ENCOUNTER — Other Ambulatory Visit: Payer: Self-pay

## 2020-07-23 DIAGNOSIS — R1084 Generalized abdominal pain: Secondary | ICD-10-CM | POA: Diagnosis present

## 2020-07-23 DIAGNOSIS — M6281 Muscle weakness (generalized): Secondary | ICD-10-CM

## 2020-07-23 DIAGNOSIS — R252 Cramp and spasm: Secondary | ICD-10-CM | POA: Insufficient documentation

## 2020-07-23 NOTE — Patient Instructions (Addendum)
Guided Meditation for Pelvic Floor Relaxation  [employer] on you tube [facility] [address], [place], [place] [address] Phone # [phone] Fax [phone]  Access Code: [email or web address] URL: [email or web address] Date: 07/23/2020 Prepared by: Earlie Counts  Exercises Supine Hamstring Stretch - 1 x daily - 7 x weekly - 1 sets - 2 reps - 30 sec hold Supine Butterfly Groin Stretch - 1 x daily - 7 x weekly - 1 sets - 1 reps - 30 sec hold Supine Figure 4 Piriformis Stretch - 1 x daily - 7 x weekly - 1 sets - 2 reps - 30 sec hold Supine Lower Trunk Rotation - 1 x daily - 7 x weekly - 1 sets - 2 reps - 15 sec hold

## 2020-07-23 NOTE — Therapy (Signed)
[facility] [address] [place], [place], [address] Phone: [phone]   Fax:  [phone]  Physical Therapy Treatment  Patient Details  Name: Penny [name] MRN: [id number] Date of Birth: 10-Mar-1981 Referring Provider (PT): Dr. Tommie Raymond   Encounter Date: 07/23/2020   PT End of Session - 07/23/20 1659    Visit Number 2    Date for PT Re-Evaluation 10/05/20    Authorization Type medicaid 8/30-9/13    Authorization - Visit Number 1    Authorization - Number of Visits 3    PT Start Time 0354    PT Stop Time 6568    PT Time Calculation (min) 40 min    Activity Tolerance Patient tolerated treatment well;No increased pain    Behavior During Therapy WFL for tasks assessed/performed           Past Medical History:  Diagnosis Date  . ADHD (attention deficit hyperactivity disorder)   . Anxiety    no meds  . Chronic back pain   . Depression    no meds  . Essential hypertension    a. Dx 06/2016.  . Migraines    last one over 1 month ago  . Overweight   . Palpitations - Sinus Tachycardia    a. 12/2016 Echo: E f55-60%, no rwma, nl LA/RA sizes.  . Placenta previa    w/ 2 pregnancies.  . Preterm labor    c/s at 36 wks  . Scoliosis   . Sinus tachycardia     Past Surgical History:  Procedure Laterality Date  . CESAREAN SECTION  2010   x 1 at 36 wks in [place]  . CESAREAN SECTION  February 13, 2012  . CHROMOPERTUBATION  10/26/2015   Procedure: ATTEMPTED CHROMOPERTUBATION;  Surgeon: Crawford Givens, MD;  Location: [facility];  Service: Gynecology;;  . LAPAROSCOPIC LYSIS OF ADHESIONS  10/26/2015   Procedure: LAPAROSCOPIC LYSIS OF ADHESIONS;  Surgeon: Crawford Givens, MD;  Location: [facility];  Service: Gynecology;;  . LAPAROSCOPY N/A 10/26/2015   Procedure: LAPAROSCOPY OPERATIVE WITH REMOVAL OF MISPLACED FILSHE CLIP;  Surgeon: Crawford Givens, MD;  Location: [facility];  Service: Gynecology;  Laterality: N/A;  . SVD  2003    x 1 in [place]  . TUBAL LIGATION    . WISDOM TOOTH EXTRACTION      There were no vitals filed for this visit.   Subjective Assessment - 07/23/20 1611    Subjective No changes since last visit. I think I am tense in shoulder.    Patient Stated Goals reduce pain    Currently in Pain? Yes    Pain Score 5     Pain Location Abdomen    Pain Orientation Right;Lower    Pain Descriptors / Indicators Aching;Constant    Pain Type Chronic pain    Pain Onset More than a month ago    Pain Frequency Intermittent    Aggravating Factors  not sure what causes the pain    Pain Relieving Factors Medicine    Multiple Pain Sites No                             [facility] Adult PT Treatment/Exercise - 07/23/20 0001      Lumbar Exercises: Stretches   Active Hamstring Stretch Right;Left;1 rep;30 seconds    Active Hamstring Stretch Limitations supine    Single Knee to Chest Stretch Right;Left;1 rep;30 seconds  Single Knee to Chest Stretch Limitations stopped due to increase in abdominal pain    Lower Trunk Rotation 2 reps;30 seconds    Lower Trunk Rotation Limitations supine; right; left    Piriformis Stretch Right;Left;1 rep;30 seconds    Piriformis Stretch Limitations supine    Other Lumbar Stretch Exercise butterfly stretch holding for 30 seconds      Manual Therapy   Manual Therapy Myofascial release    Myofascial Release release of the urogenital diaphragm and respiratory diaphragm going through all of the 3 planes of fascia; release of the abdominal midline, release of the pubovesical ligament, release or the right and left lower abdominal area                  PT Education - 07/23/20 1657    Education Details Access Code: [email or web address]; infomration on pelvic floor meditation    Person(s) Educated Patient    Methods Explanation;Demonstration;Verbal cues;Handout    Comprehension Returned demonstration;Verbalized understanding            PT Short Term Goals - 07/13/20  1433      PT SHORT TERM GOAL #1   Title pt to be I with intial HEP     Baseline ---    Time 4    Period Weeks    Status New    Target Date 08/10/20      PT SHORT TERM GOAL #2   Title --    Baseline --      PT SHORT TERM GOAL #3   Title --    Baseline --             PT Long Term Goals - 07/13/20 1710      PT LONG TERM GOAL #1   Title independent with advanced HEP    Baseline not educated yet    Time 16    Period Weeks    Status New    Target Date 10/05/20      PT LONG TERM GOAL #2   Title understand ways to manage pain with meditation, breathing, and conservation of energy    Baseline not educated yet    Time 12    Period Weeks    Status New    Target Date 10/05/20      PT LONG TERM GOAL #3   Title able to brace her abdomen during daily tasks to reduce the pain and improve her function    Baseline not able to brace her abdomen due to lack of tone    Time 12    Period Weeks    Status New    Target Date 10/05/20      PT LONG TERM GOAL #4   Title able to start a walking program for 15 mintues a day due to pain decrease </= 5/10    Baseline pain level is 8/10    Time 12    Period Weeks    Status New    Target Date 10/05/20      PT LONG TERM GOAL #5   Title right hip strength >/= 4/5 so she is able to tolerate her daily tasks with pain decreased </= 5/10    Baseline pain level 5/10; right hip strength is 3/5    Time 12    Period Weeks    Status New    Target Date 10/05/20                 Plan - 07/23/20 1700  Clinical Impression Statement Patient pain level after the myofascial release decreased to 2/10 compared to 5/10. Patient was not able to do single knee to chest due to it increasing abdominal pain. Patient was able to do the other stretches. Patient reports she is not sleeping well lately. Patient will benefit from skilled therapy to understand how to manage pain and improve overall strength.    Personal Factors and Comorbidities  Comorbidity 3+;Age;Fitness;Sex    Comorbidities 2x cesaren section; exploratory laparotomy    Examination-Activity Limitations Toileting;Locomotion Level;Squat;Stairs;Stand;Sleep;Carry;Dressing;Bed Mobility    Examination-Participation Restrictions Community Activity;Interpersonal Relationship;Cleaning    Stability/Clinical Decision Making Evolving/Moderate complexity    Rehab Potential Good    PT Frequency 1x / week    PT Duration 12 weeks    PT Treatment/Interventions Biofeedback;Cryotherapy;Electrical Stimulation;Moist Heat;Ultrasound;Neuromuscular re-education;Therapeutic exercise;Therapeutic activities;Patient/family education;Manual techniques;Dry needling;Taping;Spinal Manipulations    PT Next Visit Plan stretch the hip flexors, release of the abdomen, expanding the rib cage, see if she did the meditation    PT Home Exercise Plan Access Code: [email or web address]    Consulted and Agree with Plan of Care Patient           Patient will benefit from skilled therapeutic intervention in order to improve the following deficits and impairments:  Decreased range of motion, Increased fascial restricitons, Decreased endurance, Increased muscle spasms, Pain, Decreased strength, Decreased mobility  Visit Diagnosis: Muscle weakness (generalized)  Cramp and spasm  Generalized abdominal pain     Problem List Patient Active Problem List   Diagnosis Date Noted  . Uterine fibroid 01/23/2016  . Right groin pain 01/23/2016  . History of C-section 01/23/2016  . Migraines   . Anxiety   . Depression     Earlie Counts, PT 07/23/20 5:05 PM   [facility] [address], [place], [place], [address] Phone: [phone]   Fax:  [phone]  Name: Penny [name] MRN: [id number] Date of Birth: 02-Oct-1981

## 2020-07-27 ENCOUNTER — Encounter: Payer: Medicaid Other | Admitting: Physical Therapy

## 2020-07-29 ENCOUNTER — Ambulatory Visit (HOSPITAL_COMMUNITY)
Admission: EM | Admit: 2020-07-29 | Discharge: 2020-07-29 | Disposition: A | Discharge status: Home / Self Care | Payer: Medicaid Other | Attending: Family Medicine | Admitting: Family Medicine

## 2020-07-29 ENCOUNTER — Encounter (HOSPITAL_COMMUNITY): Payer: Self-pay

## 2020-07-29 ENCOUNTER — Other Ambulatory Visit: Payer: Self-pay

## 2020-07-29 DIAGNOSIS — Z793 Long term (current) use of hormonal contraceptives: Secondary | ICD-10-CM | POA: Diagnosis not present

## 2020-07-29 DIAGNOSIS — J3089 Other allergic rhinitis: Secondary | ICD-10-CM | POA: Diagnosis not present

## 2020-07-29 DIAGNOSIS — F419 Anxiety disorder, unspecified: Secondary | ICD-10-CM | POA: Insufficient documentation

## 2020-07-29 DIAGNOSIS — Z79899 Other long term (current) drug therapy: Secondary | ICD-10-CM | POA: Diagnosis not present

## 2020-07-29 DIAGNOSIS — J069 Acute upper respiratory infection, unspecified: Secondary | ICD-10-CM

## 2020-07-29 DIAGNOSIS — R05 Cough: Secondary | ICD-10-CM | POA: Insufficient documentation

## 2020-07-29 DIAGNOSIS — Z7901 Long term (current) use of anticoagulants: Secondary | ICD-10-CM | POA: Diagnosis not present

## 2020-07-29 DIAGNOSIS — Z6837 Body mass index (BMI) 37.0-37.9, adult: Secondary | ICD-10-CM | POA: Insufficient documentation

## 2020-07-29 DIAGNOSIS — E663 Overweight: Secondary | ICD-10-CM | POA: Diagnosis not present

## 2020-07-29 DIAGNOSIS — M419 Scoliosis, unspecified: Secondary | ICD-10-CM | POA: Diagnosis not present

## 2020-07-29 DIAGNOSIS — Z20822 Contact with and (suspected) exposure to covid-19: Secondary | ICD-10-CM | POA: Insufficient documentation

## 2020-07-29 DIAGNOSIS — F909 Attention-deficit hyperactivity disorder, unspecified type: Secondary | ICD-10-CM | POA: Diagnosis not present

## 2020-07-29 DIAGNOSIS — F17210 Nicotine dependence, cigarettes, uncomplicated: Secondary | ICD-10-CM | POA: Insufficient documentation

## 2020-07-29 DIAGNOSIS — J302 Other seasonal allergic rhinitis: Secondary | ICD-10-CM | POA: Diagnosis not present

## 2020-07-29 DIAGNOSIS — R1110 Vomiting, unspecified: Secondary | ICD-10-CM | POA: Diagnosis not present

## 2020-07-29 DIAGNOSIS — I10 Essential (primary) hypertension: Secondary | ICD-10-CM | POA: Diagnosis not present

## 2020-07-29 DIAGNOSIS — F329 Major depressive disorder, single episode, unspecified: Secondary | ICD-10-CM | POA: Diagnosis not present

## 2020-07-29 LAB — SARS CORONAVIRUS 2 (TAT 6-24 HRS): SARS Coronavirus 2: NEGATIVE

## 2020-07-29 MED ORDER — MONTELUKAST SODIUM 10 MG PO TABS
10.0000 mg | ORAL_TABLET | Freq: Every day | ORAL | 0 refills | Status: DC
Start: 2020-07-29 — End: 2021-04-07

## 2020-07-29 MED ORDER — ALBUTEROL SULFATE HFA 108 (90 BASE) MCG/ACT IN AERS: 1.0000 | INHALATION_SPRAY | Freq: Four times a day (QID) | RESPIRATORY_TRACT | 0 refills | Status: DC | PRN

## 2020-07-29 MED ORDER — PROMETHAZINE HCL 12.5 MG PO TABS: 12.5000 mg | ORAL_TABLET | Freq: Four times a day (QID) | ORAL | 0 refills | Status: DC | PRN

## 2020-07-29 MED ORDER — LEVOCETIRIZINE DIHYDROCHLORIDE 5 MG PO TABS: 5.0000 mg | ORAL_TABLET | Freq: Every evening | ORAL | 0 refills | Status: AC

## 2020-07-29 NOTE — ED Triage Notes (Signed)
Pt c/o non productive coughx2 mos. Pt states she vomited yesterday, because she has been coughing so much. Pt denies any other sx.

## 2020-07-29 NOTE — ED Provider Notes (Signed)
[facility]    CSN: [id number] Arrival date & time: 07/29/20  9629      History   Chief Complaint Chief Complaint  Patient presents with  . Cough    HPI Penny [name] is a 39 y.o. female.   Dry hacking cough since an episode of bronchitis in July that never resolved. Last night had a particularly bad coughing spell followed by several bouts of vomiting. Does have a hx of seasonal allergies, not currently on any medications for this. Denies fever, chills, body aches, CP, diarrhea. Son has been sick for about a week with similar sxs but otherwise no known sick contacts.      Past Medical History:  Diagnosis Date  . ADHD (attention deficit hyperactivity disorder)   . Anxiety    no meds  . Chronic back pain   . Depression    no meds  . Essential hypertension    a. Dx 06/2016.  . Migraines    last one over 1 month ago  . Overweight   . Palpitations - Sinus Tachycardia    a. 12/2016 Echo: E f55-60%, no rwma, nl LA/RA sizes.  . Placenta previa    w/ 2 pregnancies.  . Preterm labor    c/s at 36 wks  . Scoliosis   . Sinus tachycardia     Patient Active Problem List   Diagnosis Date Noted  . Uterine fibroid 01/23/2016  . Right groin pain 01/23/2016  . History of C-section 01/23/2016  . Migraines   . Anxiety   . Depression     Past Surgical History:  Procedure Laterality Date  . CESAREAN SECTION  2010   x 1 at 36 wks in [place]  . CESAREAN SECTION  February 13, 2012  . CHROMOPERTUBATION  10/26/2015   Procedure: ATTEMPTED CHROMOPERTUBATION;  Surgeon: Crawford Givens, MD;  Location: [facility];  Service: Gynecology;;  . LAPAROSCOPIC LYSIS OF ADHESIONS  10/26/2015   Procedure: LAPAROSCOPIC LYSIS OF ADHESIONS;  Surgeon: Crawford Givens, MD;  Location: [facility];  Service: Gynecology;;  . LAPAROSCOPY N/A 10/26/2015   Procedure: LAPAROSCOPY OPERATIVE WITH REMOVAL OF MISPLACED FILSHE CLIP;  Surgeon: Crawford Givens, MD;  Location: [facility];  Service: Gynecology;   Laterality: N/A;  . SVD  2003   x 1 in [place]  . TUBAL LIGATION    . WISDOM TOOTH EXTRACTION      OB History    Gravida  3   Para  3   Term  1   Preterm  2   AB  0   Living  3     SAB  0   TAB  0   Ectopic  0   Multiple  0   Live Births  3            Home Medications    Prior to Admission medications   Medication Sig Start Date End Date Taking? Authorizing Provider  ACCU-CHEK GUIDE test strip USE 1 strip 2 TIMES DAILY BEFORE MEALS 04/23/20   [provider]  Accu-Chek Softclix Lancets lancets 2 (two) times daily. 04/24/20   [provider]  AIMOVIG 140 MG/ML SOAJ Inject 1,140 mg into the skin every 30 (thirty) days.  05/14/18   [provider]  albuterol (VENTOLIN HFA) 108 (90 Base) MCG/ACT inhaler Inhale 1-2 puffs into the lungs every 6 (six) hours as needed for wheezing or shortness of breath. 07/29/20   Volney American, PA-C  atomoxetine (STRATTERA) 80 MG capsule Take  80 mg by mouth daily.     [provider]  atomoxetine (STRATTERA) 80 MG capsule Take by mouth. 09/17/19   [provider]  benzonatate (TESSALON PERLES) 100 MG capsule Take 1 capsule (100 mg total) by mouth 2 (two) times daily as needed for cough. 05/27/20 05/27/21  Rudolpho Sevin, NP  Blood Glucose Monitoring Suppl (ACCU-CHEK GUIDE) w/Device KIT See admin instructions. 04/23/20   [provider]  butalbital-acetaminophen-caffeine (FIORICET) 50-325-40 MG tablet Take 1-2 tablets by mouth every 6 (six) hours as needed for headache. 09/10/19 09/09/20  Lucrezia Starch, MD  Carbinoxamine Maleate (RYVENT) 6 MG TABS TAKE 1 TABLET BY MOUTH EVERY DAY FOR ALLERGIES 08/27/19   [provider]  diclofenac (VOLTAREN) 75 MG EC tablet Take 75 mg by mouth 2 (two) times daily. 04/23/20   [provider]  DULoxetine (CYMBALTA) 30 MG capsule Take 30 mg by mouth daily. Pt takes with the cymbalta 43m for a total of 90 mg    [provider]  DULoxetine (CYMBALTA) 30 MG capsule Take 1 capsule by mouth at bedtime. 08/27/19   [provider]  DULoxetine (CYMBALTA) 60 MG capsule Take 60 mg by mouth daily. Pt takes with cymbalta 30 mg for a total of 90 mg    [provider]  DULoxetine (CYMBALTA) 60 MG capsule Take by mouth. 08/27/19   [provider]  EPINEPHrine 0.3 mg/0.3 mL IJ SOAJ injection SMARTSIG:1 Syringe(s) IM PRN 01/14/20   [provider]  Erenumab-aooe (AIMOVIG) 140 MG/ML SOAJ Aimovig Autoinjector 140 mg/mL subcutaneous auto-injector  INJECT 1 ML BELOW THE SKIN ONCE A MONTH 05/14/18   [provider]  gabapentin (NEURONTIN) 800 MG tablet Take 800 mg by mouth 3 (three) times daily.    [provider]  ipratropium (ATROVENT) 0.03 % nasal spray Place 2 sprays into both nostrils every 12 (twelve) hours. 05/28/20   Lamptey, PMyrene Galas MD  levocetirizine (XYZAL) 5 MG tablet Take 1 tablet (5 mg total) by mouth every evening. 07/29/20   LVolney American PA-C  montelukast (SINGULAIR) 10 MG tablet Take 1 tablet (10 mg total) by mouth at bedtime. 07/29/20   LVolney American PA-C  promethazine (PHENERGAN) 12.5 MG tablet Take 1 tablet (12.5 mg total) by mouth every 6 (six) hours as needed for nausea or vomiting. 07/29/20   LVolney American PA-C  promethazine (PHENERGAN) 25 MG tablet Take 1 tablet (25 mg total) by mouth every 6 (six) hours as needed for up to 3 days for nausea or vomiting. 06/13/20 06/16/20  ZKonrad Felix PA-C  tiZANidine (ZANAFLEX) 4 MG tablet Take 4 mg by mouth every 6 (six) hours as needed for muscle spasms.    [provider]  Cetirizine HCl 10 MG CAPS Take 1 capsule (10 mg total) by mouth daily for 10 days. Patient not taking: Reported on 06/22/2019 02/11/19 08/18/19  Wieters, HElesa Hacker PA-C  Dextromethorphan HBr 10 MG/5ML SYRP 10 ml qhs prn cough at night time. Patient not taking: Reported on 06/22/2019 02/04/19 08/18/19  Rodriguez-Southworth,  SSunday Spillers PA-C  fluticasone ([facility] 50 MCG/ACT nasal spray Place 1 spray into both nostrils daily. 05/27/20 05/28/20  SRudolpho Sevin NP  metoprolol succinate (TOPROL-XL) 25 MG 24 hr tablet Take 1 tablet by mouth 2 (two) times daily. 08/27/19 05/28/20  [provider]  norethindrone (AYGESTIN) 5 MG tablet Take by mouth. 05/07/20 05/28/20  [provider]  Topiramate ER (TROKENDI XR) 200 MG CP24 Take 1 capsule by mouth  daily. 08/27/19 05/28/20  [provider]    Family History Family History  Problem Relation Age of Onset  . Cancer Mother   . CAD Mother   . Hypertension Sister   . Sickle cell trait Daughter   . Asthma Son   . Asthma Paternal Grandmother   . Pseudochol deficiency Neg Hx   . Malignant hyperthermia Neg Hx   . Anesthesia problems Neg Hx   . Hypotension Neg Hx     Social History Social History   Tobacco Use  . Smoking status: Current Every Day Smoker    Packs/day: 0.10    Years: 18.00    Pack years: 1.80    Types: Cigarettes  . Smokeless tobacco: Never Used  Vaping Use  . Vaping Use: Former  Substance Use Topics  . Alcohol use: Yes    Comment: Rare   . Drug use: Not Currently    Types: Marijuana     Allergies   Other and Shrimp [shellfish allergy]   Review of Systems Review of Systems PER HPI    Physical Exam Triage Vital Signs ED Triage Vitals  Enc Vitals Group     BP 07/29/20 0821 (!) 135/97     Pulse Rate 07/29/20 0821 (!) 117     Resp 07/29/20 0821 18     Temp 07/29/20 0821 99.2 F (37.3 C)     Temp Source 07/29/20 0821 Oral     SpO2 07/29/20 0821 100 %     Weight 07/29/20 0822 198 lb (89.8 kg)     Height 07/29/20 0822 '5\' 4"'  (1.626 m)     Head Circumference --      Peak Flow --      Pain Score 07/29/20 0822 0     Pain Loc --      Pain Edu? --      Excl. in [place]? --    No data found.  Updated Vital Signs BP (!) 135/97   Pulse (!) 117   Temp 99.2 F (37.3 C) (Oral)   Resp 18   Ht '5\' 4"'  (1.626 m)   Wt 198  lb (89.8 kg)   SpO2 100%   BMI 33.99 kg/m   Visual Acuity Right Eye Distance:   Left Eye Distance:   Bilateral Distance:    Right Eye Near:   Left Eye Near:    Bilateral Near:     Physical Exam Vitals and nursing note reviewed.  Constitutional:      Appearance: Normal appearance. She is not ill-appearing.  HENT:     Head: Atraumatic.     Nose: Rhinorrhea present.     Mouth/Throat:     Mouth: Mucous membranes are moist.     Pharynx: Posterior oropharyngeal erythema (posterior oropharyngeal erythema) present.  Eyes:     Extraocular Movements: Extraocular movements intact.     Conjunctiva/sclera: Conjunctivae normal.  Cardiovascular:     Rate and Rhythm: Normal rate and regular rhythm.     Heart sounds: Normal heart sounds.  Pulmonary:     Effort: Pulmonary effort is normal.     Breath sounds: Normal breath sounds. No wheezing or rales.  Abdominal:     General: Bowel sounds are normal. There is no distension.     Palpations: Abdomen is soft.     Tenderness: There is no abdominal tenderness. There is no guarding.  Musculoskeletal:        General: Normal range of motion.     Cervical back: Normal range  of motion and neck supple.  Skin:    General: Skin is warm and dry.  Neurological:     Mental Status: She is alert and oriented to person, place, and time.  Psychiatric:        Mood and Affect: Mood normal.        Thought Content: Thought content normal.        Judgment: Judgment normal.      UC Treatments / Results  Labs (all labs ordered are listed, but only abnormal results are displayed) Labs Reviewed  SARS CORONAVIRUS 2 (TAT 6-24 HRS)    EKG   Radiology No results found.  Procedures Procedures (including critical care time)  Medications Ordered in UC Medications - No data to display  Initial Impression / Assessment and Plan / UC Course  I have reviewed the triage vital signs and the nursing notes.  Pertinent labs & imaging results that were  available during my care of the patient were reviewed by me and considered in my medical decision making (see chart for details).     Unclear if sxs related to uncontrolled allergies or if new illness overlaying prior bronchitis cough. WIll r/o COVID, isolation protocol reviewed and work note given. Restart xyzal and singulair, albuterol inhaler and delsym prn. Phenergan if nausea returns. F/u if worsening or not improving.    Final Clinical Impressions(s) / UC Diagnoses   Final diagnoses:  Viral URI with cough  Seasonal allergic rhinitis due to other allergic trigger   Discharge Instructions   None    ED Prescriptions    Medication Sig Dispense Auth. Provider   albuterol (VENTOLIN HFA) 108 (90 Base) MCG/ACT inhaler Inhale 1-2 puffs into the lungs every 6 (six) hours as needed for wheezing or shortness of breath. 18 g Volney American, PA-C   montelukast (SINGULAIR) 10 MG tablet Take 1 tablet (10 mg total) by mouth at bedtime. 18 tablet Volney American, [place]   levocetirizine (XYZAL) 5 MG tablet Take 1 tablet (5 mg total) by mouth every evening. 30 tablet Volney American, [place]   promethazine (PHENERGAN) 12.5 MG tablet Take 1 tablet (12.5 mg total) by mouth every 6 (six) hours as needed for nausea or vomiting. 30 tablet Volney American, [place]     PDMP not reviewed this encounter.   Volney American, [place] 07/29/20 [phone]

## 2020-08-01 ENCOUNTER — Ambulatory Visit (HOSPITAL_COMMUNITY)
Admission: EM | Admit: 2020-08-01 | Discharge: 2020-08-01 | Disposition: A | Discharge status: Home / Self Care | Payer: Medicaid Other | Attending: Family Medicine | Admitting: Family Medicine

## 2020-08-01 ENCOUNTER — Ambulatory Visit (INDEPENDENT_AMBULATORY_CARE_PROVIDER_SITE_OTHER): Payer: Medicaid Other

## 2020-08-01 ENCOUNTER — Encounter (HOSPITAL_COMMUNITY): Payer: Self-pay | Admitting: Emergency Medicine

## 2020-08-01 ENCOUNTER — Other Ambulatory Visit: Payer: Self-pay

## 2020-08-01 DIAGNOSIS — R0602 Shortness of breath: Secondary | ICD-10-CM | POA: Diagnosis not present

## 2020-08-01 DIAGNOSIS — R1111 Vomiting without nausea: Secondary | ICD-10-CM | POA: Diagnosis not present

## 2020-08-01 DIAGNOSIS — R05 Cough: Secondary | ICD-10-CM | POA: Diagnosis not present

## 2020-08-01 DIAGNOSIS — J4541 Moderate persistent asthma with (acute) exacerbation: Secondary | ICD-10-CM | POA: Diagnosis present

## 2020-08-01 DIAGNOSIS — R059 Cough, unspecified: Secondary | ICD-10-CM

## 2020-08-01 MED ORDER — METHYLPREDNISOLONE SODIUM SUCC 125 MG IJ SOLR
INTRAMUSCULAR | Status: AC
  Filled 2020-08-01: qty 2

## 2020-08-01 MED ORDER — BUDESONIDE-FORMOTEROL FUMARATE 160-4.5 MCG/ACT IN AERO: 2.0000 | INHALATION_SPRAY | Freq: Two times a day (BID) | RESPIRATORY_TRACT | 2 refills | Status: AC

## 2020-08-01 MED ORDER — METHYLPREDNISOLONE SODIUM SUCC 125 MG IJ SOLR
125.0000 mg | Freq: Once | INTRAMUSCULAR | Status: AC
  Administered 2020-08-01: 125 mg via INTRAMUSCULAR

## 2020-08-01 MED ORDER — AZITHROMYCIN 250 MG PO TABS: 250.0000 mg | ORAL_TABLET | Freq: Every day | ORAL | 0 refills | Status: DC

## 2020-08-01 MED ORDER — PREDNISONE 10 MG (21) PO TBPK
ORAL_TABLET | Freq: Every day | ORAL | 0 refills | Status: DC
Start: 2020-08-01 — End: 2020-08-14

## 2020-08-01 NOTE — Discharge Instructions (Addendum)
I have sent in a steroid taper for you to take as well. Take 6 tablets for the first two days, take 5 tablets for day three and four, take 4 tablets for days five and six, take 3 tablets for days seven and eight, take 2 tablets for day nine and ten, then take 1 tablet for days eleven and twelve.  You have received a steroid injection in the office today  I have sent in Symbicort for you to use 2 puffs in the morning and 2 puffs in the evening  Any other medications that were prescribed at your last visit  Follow-up with this office or with primary care as needed  Go to the ER with acute worsening symptoms, trouble swallowing, trouble breathing, other concerning symptoms

## 2020-08-01 NOTE — ED Notes (Signed)
Patient is getting dressed.  Patient has had a lengthy conversation with provider.

## 2020-08-01 NOTE — ED Notes (Signed)
Penny Maryland, np at bedside

## 2020-08-01 NOTE — ED Provider Notes (Addendum)
[facility]   [id number] 08/01/20 Arrival Time: 6967   SUBJECTIVE:  Penny [name] is a 39 y.o. female who presents with complaint of SOB, cough, gag, vomit for the last week. Was seen in this office and treated with albuterol inhaler, Singulair, Zyrtec, Zofran.  Reports that these are not helping.  Covid test was negative at that visit.  Denies ever been told that she has asthma.  Does have history of bronchitis.   Social History   Tobacco Use  Smoking Status Current Every Day Smoker  . Packs/day: 0.10  . Years: 18.00  . Pack years: 1.80  . Types: Cigarettes  Smokeless Tobacco Never Used    ROS: As per HPI.   OBJECTIVE:  Vitals:   08/01/20 1219  BP: 98/76  Pulse: (!) 113  Resp: 20  Temp: 99.4 F (37.4 C)  TempSrc: Oral  SpO2: 96%     General appearance: alert; no distress HEENT: nasal congestion; clear runny nose; throat irritation secondary to post-nasal drainage Neck: supple without LAD Lungs: diffuse wheezing to bilateral lung fields Skin: warm and dry Psychological: alert and cooperative; normal mood and affect  Results for orders placed or performed during the hospital encounter of 07/29/20  SARS CORONAVIRUS 2 (TAT 6-24 HRS) Nasopharyngeal Nasopharyngeal Swab   Specimen: Nasopharyngeal Swab  Result Value Ref Range   SARS Coronavirus 2 NEGATIVE NEGATIVE    Labs Reviewed - No data to display  DG Chest 2 View  Result Date: 08/01/2020 CLINICAL DATA:  Shortness of breath with cough 4 days. EXAM: CHEST - 2 VIEW COMPARISON:  08/18/2019 FINDINGS: Lungs are adequately inflated without focal airspace consolidation or effusion. Cardiomediastinal silhouette is normal. Moderate curvature of the thoracic spine convex right. IMPRESSION: No active cardiopulmonary disease. Electronically Signed   By: Marin Olp M.D.   On: 08/01/2020 13:41    Allergies  Allergen Reactions  . Other Itching and Swelling    Itching and swelling in mouth and face  .  Shrimp [Shellfish Allergy] Itching and Swelling    Itching and swelling in mouth and face    Past Medical History:  Diagnosis Date  . ADHD (attention deficit hyperactivity disorder)   . Anxiety    no meds  . Chronic back pain   . Depression    no meds  . Essential hypertension    a. Dx 06/2016.  . Migraines    last one over 1 month ago  . Overweight   . Palpitations - Sinus Tachycardia    a. 12/2016 Echo: E f55-60%, no rwma, nl LA/RA sizes.  . Placenta previa    w/ 2 pregnancies.  . Preterm labor    c/s at 36 wks  . Scoliosis   . Sinus tachycardia    Social History   Socioeconomic History  . Marital status: Married    Spouse name: Not on file  . Number of children: Not on file  . Years of education: Not on file  . Highest education level: Not on file  Occupational History  . Not on file  Tobacco Use  . Smoking status: Current Every Day Smoker    Packs/day: 0.10    Years: 18.00    Pack years: 1.80    Types: Cigarettes  . Smokeless tobacco: Never Used  Vaping Use  . Vaping Use: Former  Substance and Sexual Activity  . Alcohol use: Yes    Comment: Rare   . Drug use: Not Currently    Types: Marijuana  .  Sexual activity: Not on file    Comment: BTL  Other Topics Concern  . Not on file  Social History Narrative   Lives in [place] with husband and three children - ages 63, 74, 42.  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  Does not routinely exercise.   Social Determinants of Health   Financial Resource Strain:   . Difficulty of Paying Living Expenses: Not on file  Food Insecurity:   . Worried About Charity fundraiser in the Last Year: Not on file  . Ran Out of Food in the Last Year: Not on file  Transportation Needs:   . Lack of Transportation (Medical): Not on file  . Lack of Transportation (Non-Medical): Not on file  Physical Activity:   . Days of Exercise per Week: Not on file  . Minutes of Exercise per  Session: Not on file  Stress:   . Feeling of Stress : Not on file  Social Connections:   . Frequency of Communication with Friends and Family: Not on file  . Frequency of Social Gatherings with Friends and Family: Not on file  . Attends Religious Services: Not on file  . Active Member of Clubs or Organizations: Not on file  . Attends Archivist Meetings: Not on file  . Marital Status: Not on file  Intimate Partner Violence:   . Fear of Current or Ex-Partner: Not on file  . Emotionally Abused: Not on file  . Physically Abused: Not on file  . Sexually Abused: Not on file   Family History  Problem Relation Age of Onset  . Cancer Mother   . CAD Mother   . Hypertension Sister   . Sickle cell trait Daughter   . Asthma Son   . Asthma Paternal Grandmother   . Pseudochol deficiency Neg Hx   . Malignant hyperthermia Neg Hx   . Anesthesia problems Neg Hx   . Hypotension Neg Hx      ASSESSMENT & PLAN:  1. Moderate persistent asthma with acute exacerbation   2. Cough   3. Vomiting without nausea, intractability of vomiting not specified, unspecified vomiting type     Meds ordered this encounter  Medications  . budesonide-formoterol (SYMBICORT) 160-4.5 MCG/ACT inhaler    Sig: Inhale 2 puffs into the lungs in the morning and at bedtime.    Dispense:  1 each    Refill:  2    Order Specific Question:   Supervising Provider    Answer:   Chase Picket [id number]  . predniSONE (STERAPRED UNI-PAK 21 TAB) 10 MG (21) TBPK tablet    Sig: Take by mouth daily. Take 6 tabs by mouth daily  for 2 days, then 5 tabs for 2 days, then 4 tabs for 2 days, then 3 tabs for 2 days, 2 tabs for 2 days, then 1 tab by mouth daily for 2 days    Dispense:  42 tablet    Refill:  0    Order Specific Question:   Supervising Provider    Answer:   Chase Picket [id number]  . methylPREDNISolone sodium succinate (SOLU-MEDROL) 125 mg/2 mL injection 125 mg    Highly suspect undiagnosed asthma We  will treat as asthma exacerbation Chest x-ray negative today Prescribed Symbicort Prescribe steroid taper  Solu-Medrol 125 mg IM in office today  OTC symptom care as needed. Will plan f/u with PCP or here as needed.  Reviewed expectations re: course of current medical issues. Questions answered. Outlined signs and symptoms indicating need for more acute intervention. Patient verbalized understanding. After Visit Summary given.           Faustino Congress, NP 08/01/20 1353    Faustino Congress, NP 08/01/20 1355

## 2020-08-01 NOTE — ED Triage Notes (Signed)
Pt presents with cough, SOB, vomiting xs 5-7 days.   States she was seen on Wednesday and symptoms have gotten worse. States meds prescribed are not helping.

## 2020-08-03 ENCOUNTER — Encounter: Payer: Medicaid Other | Admitting: Physical Therapy

## 2020-08-10 ENCOUNTER — Other Ambulatory Visit: Payer: Self-pay

## 2020-08-10 ENCOUNTER — Encounter: Payer: Self-pay | Admitting: Physical Therapy

## 2020-08-10 ENCOUNTER — Ambulatory Visit: Payer: Medicaid Other | Admitting: Physical Therapy

## 2020-08-10 DIAGNOSIS — R252 Cramp and spasm: Secondary | ICD-10-CM

## 2020-08-10 DIAGNOSIS — R1084 Generalized abdominal pain: Secondary | ICD-10-CM

## 2020-08-10 DIAGNOSIS — M6281 Muscle weakness (generalized): Secondary | ICD-10-CM | POA: Diagnosis not present

## 2020-08-10 NOTE — Therapy (Signed)
[facility] [address] [place], [place], [address] Phone: [phone]   Fax:  [phone]  Physical Therapy Treatment  Patient Details  Name: Penny [name] MRN: [id number] Date of Birth: 16-Jul-1981 Referring Provider (PT): Dr. Tommie Raymond   Encounter Date: 08/10/2020   PT End of Session - 08/10/20 1229    Visit Number 3    Date for PT Re-Evaluation 10/05/20    Authorization Type medicaid 8/30-9/13    Authorization - Visit Number 2    Authorization - Number of Visits 3    PT Start Time 1740    PT Stop Time 1225    PT Time Calculation (min) 40 min    Activity Tolerance Patient tolerated treatment well;No increased pain    Behavior During Therapy WFL for tasks assessed/performed           Past Medical History:  Diagnosis Date  . ADHD (attention deficit hyperactivity disorder)   . Anxiety    no meds  . Chronic back pain   . Depression    no meds  . Essential hypertension    a. Dx 06/2016.  . Migraines    last one over 1 month ago  . Overweight   . Palpitations - Sinus Tachycardia    a. 12/2016 Echo: E f55-60%, no rwma, nl LA/RA sizes.  . Placenta previa    w/ 2 pregnancies.  . Preterm labor    c/s at 36 wks  . Scoliosis   . Sinus tachycardia     Past Surgical History:  Procedure Laterality Date  . CESAREAN SECTION  2010   x 1 at 36 wks in [place]  . CESAREAN SECTION  February 13, 2012  . CHROMOPERTUBATION  10/26/2015   Procedure: ATTEMPTED CHROMOPERTUBATION;  Surgeon: Crawford Givens, MD;  Location: [facility];  Service: Gynecology;;  . LAPAROSCOPIC LYSIS OF ADHESIONS  10/26/2015   Procedure: LAPAROSCOPIC LYSIS OF ADHESIONS;  Surgeon: Crawford Givens, MD;  Location: [facility];  Service: Gynecology;;  . LAPAROSCOPY N/A 10/26/2015   Procedure: LAPAROSCOPY OPERATIVE WITH REMOVAL OF MISPLACED FILSHE CLIP;  Surgeon: Crawford Givens, MD;  Location: [facility];  Service: Gynecology;  Laterality: N/A;  . SVD  2003    x 1 in [place]  . TUBAL LIGATION    . WISDOM TOOTH EXTRACTION      There were no vitals filed for this visit.   Subjective Assessment - 08/10/20 1148    Subjective I had an asthma attack and now I am losing my voice.    Patient Stated Goals reduce pain    Currently in Pain? Yes    Pain Score 6     Pain Location Abdomen    Pain Orientation Right;Lower    Pain Descriptors / Indicators Aching;Constant    Pain Type Chronic pain    Pain Onset More than a month ago    Pain Frequency Intermittent    Aggravating Factors  not sure what causes the pain    Pain Relieving Factors medicine    Multiple Pain Sites No              [facility] PT Assessment - 08/10/20 0001      Assessment   Medical Diagnosis R10.2 Pelvic pain in female    Referring Provider (PT) Dr. Tommie Raymond    Onset Date/Surgical Date 02/13/12    Prior Therapy none      Precautions   Precautions None      Restrictions  Weight Bearing Restrictions No      Home Ecologist residence      Prior Function   Level of Independence Independent      Cognition   Overall Cognitive Status Within Functional Limits for tasks assessed      Posture/Postural Control   Posture/Postural Control Postural limitations    Posture Comments scoliosis      ROM / Strength   AROM / PROM / Strength PROM;AROM;Strength      AROM   Lumbar Flexion full    Lumbar Extension full    Lumbar - Right Side Bend full      Strength   Right Hip Flexion 5/5    Right Hip Extension 5/5    Right Hip External Rotation  5/5    Right Hip ABduction 5/5    Right Hip ADduction 5/5                      Pelvic Floor Special Questions - 08/10/20 0001    Palpation tightness in the left levator ani    Strength weak squeeze, no lift             [facility] Adult PT Treatment/Exercise - 08/10/20 0001      Manual Therapy   Manual Therapy Soft tissue mobilization;Myofascial release    Manual therapy  comments educated patient on how to perform c-section scar massage    Soft tissue mobilization scar massage to C-section scar ; soft tissue work to the right lower rib cage intercoastals in left sidely, tissue work to tthe rectous abdominus and obliques and right diaphragm, and quadratus    Myofascial Release tissue rolling of the lower abdomen; fascial release on the right abdominal area to go through the fascia and release the tissue                  PT Education - 08/10/20 1225    Education Details handout for c-section scar massage    Methods Explanation;Demonstration;Handout    Comprehension Returned demonstration;Verbalized understanding            PT Short Term Goals - 08/10/20 1443      PT SHORT TERM GOAL #1   Title pt to be I with intial HEP     Time 4    Period Weeks    Status On-going             PT Long Term Goals - 08/10/20 1443      PT LONG TERM GOAL #1   Title independent with advanced HEP    Baseline educated with different exercises as she is advanced    Time 12    Period Weeks    Status On-going      PT LONG TERM GOAL #2   Title understand ways to manage pain with meditation, breathing, and conservation of energy    Baseline continues to be educated    Time 12    Period Weeks    Status On-going      PT LONG TERM GOAL #3   Title able to brace her abdomen during daily tasks to reduce the pain and improve her function    Baseline has not learned yet due to reducing pain    Time 12    Period Weeks    Status On-going      PT LONG TERM GOAL #4   Title able to start a walking program for 15 mintues a day  due to pain decrease </= 5/10    Baseline was sick for 2 weeks so has not started walking program yet    Time 12    Period Weeks    Status On-going      PT LONG TERM GOAL #5   Title right hip strength >/= 4/5 so she is able to tolerate her daily tasks with pain decreased </= 5/10    Baseline pain level 5/10; right hip strength is 5/5     Time 12    Period Weeks    Status Partially Met                 Plan - 08/10/20 1438    Clinical Impression Statement Patient was not in therapy for 2 weeks due to an asthma attack and gave her a cough for 2 weeks. Patient has increased strength of bilateral hips to 5/5. Patient has full lumbar ROM. Patient continues to have tightness in her c-section scar. She has trigger points in the right lower intercoastals, right diaphragm, right obliques, right rectus abdominus. Patient has fascial tightness in the right abdominal wall. After manual work she did not have right quadrant pain today. Patient has difficulty with expanding the right lower rib cage. Patient will benefit from skilled therapy to understand how to mange her pain and improve overall strength.    Comorbidities 2x cesaren section; exploratory laparotomy    Examination-Activity Limitations Toileting;Locomotion Level;Squat;Stairs;Stand;Sleep;Carry;Dressing;Bed Mobility    Examination-Participation Restrictions Community Activity;Interpersonal Relationship;Cleaning    Stability/Clinical Decision Making Evolving/Moderate complexity    Rehab Potential Good    PT Frequency 1x / week    PT Duration 12 weeks    PT Treatment/Interventions Biofeedback;Cryotherapy;Electrical Stimulation;Moist Heat;Ultrasound;Neuromuscular re-education;Therapeutic exercise;Therapeutic activities;Patient/family education;Manual techniques;Dry needling;Taping;Spinal Manipulations    PT Next Visit Plan stretch the hip flexors, release of the abdomen, expanding the rib cage, see if she did the meditation; manual work to the right abdomen    PT Home Exercise Plan Access Code: [email or web address]    Consulted and Agree with Plan of Care Patient           Patient will benefit from skilled therapeutic intervention in order to improve the following deficits and impairments:  Decreased range of motion, Increased fascial restricitons, Decreased endurance, Increased muscle  spasms, Pain, Decreased strength, Decreased mobility  Visit Diagnosis: Muscle weakness (generalized)  Cramp and spasm  Generalized abdominal pain     Problem List Patient Active Problem List   Diagnosis Date Noted  . Moderate persistent asthma with acute exacerbation 08/01/2020  . Uterine fibroid 01/23/2016  . Right groin pain 01/23/2016  . History of C-section 01/23/2016  . Migraines   . Anxiety   . Depression     Earlie Counts, PT 08/10/20 2:45 PM   [facility] [address], [place], [place], [address] Phone: [phone]   Fax:  [phone]  Name: CHER FRANZONI MRN: [id number] Date of Birth: [birth year]

## 2020-08-14 ENCOUNTER — Ambulatory Visit (INDEPENDENT_AMBULATORY_CARE_PROVIDER_SITE_OTHER): Payer: Medicaid Other

## 2020-08-14 ENCOUNTER — Ambulatory Visit (HOSPITAL_COMMUNITY)
Admission: EM | Admit: 2020-08-14 | Discharge: 2020-08-14 | Disposition: A | Discharge status: Home / Self Care | Payer: Medicaid Other | Attending: Family Medicine | Admitting: Family Medicine

## 2020-08-14 ENCOUNTER — Other Ambulatory Visit: Payer: Self-pay

## 2020-08-14 DIAGNOSIS — R0602 Shortness of breath: Secondary | ICD-10-CM

## 2020-08-14 DIAGNOSIS — R053 Chronic cough: Secondary | ICD-10-CM

## 2020-08-14 DIAGNOSIS — J4521 Mild intermittent asthma with (acute) exacerbation: Secondary | ICD-10-CM

## 2020-08-14 DIAGNOSIS — Z720 Tobacco use: Secondary | ICD-10-CM

## 2020-08-14 MED ORDER — ESOMEPRAZOLE MAGNESIUM 40 MG PO CPDR: 40.0000 mg | DELAYED_RELEASE_CAPSULE | Freq: Every day | ORAL | 0 refills | Status: AC

## 2020-08-14 MED ORDER — AEROCHAMBER PLUS FLO-VU LARGE MISC
Status: AC
  Filled 2020-08-14: qty 1

## 2020-08-14 MED ORDER — ALBUTEROL SULFATE (2.5 MG/3ML) 0.083% IN NEBU
2.5000 mg | INHALATION_SOLUTION | Freq: Four times a day (QID) | RESPIRATORY_TRACT | 5 refills | Status: DC | PRN
Start: 2020-08-14 — End: 2021-05-26

## 2020-08-14 NOTE — ED Triage Notes (Addendum)
Patient in with complaints of SOB that has continued since last visit on 08/01/20.Patient was diagnosed with asthma and bronchitis on 08/01/20. Patient states that she has experienced a cough and SOB since July. Patient was given a steroid and antibiotics and has completed course. Patient is using rescue inhaler 4 times a day. Patient had to call ambulance this morning at 5 am due to feeling like she was struggling to breath. Patient was given a neb tx but was not taken to the ED.Patient was tested for COVID 9/15 and results were negative. Patient does not want to be tested for COVID during this visit.

## 2020-08-14 NOTE — ED Provider Notes (Signed)
[facility]   [id number] 08/14/20 Arrival Time: 2751  ASSESSMENT & PLAN:  1. Persistent cough   2. Tobacco abuse   3. Mild intermittent asthma with exacerbation     I have personally viewed the imaging studies ordered this visit. Normal CXR.  Declines COVID testing.  Question GERD as contributer to persistent coughing. Discussed trial of PPI. Normal lung exam. Inhaler spacer given.   Meds ordered this encounter  Medications  . esomeprazole (NEXIUM) 40 MG capsule    Sig: Take 1 capsule (40 mg total) by mouth daily.    Dispense:  30 capsule    Refill:  0  . albuterol (PROVENTIL) (2.5 MG/3ML) 0.083% nebulizer solution    Sig: Take 3 mLs (2.5 mg total) by nebulization every 6 (six) hours as needed for wheezing or shortness of breath.    Dispense:  75 mL    Refill:  5     Follow-up Information    Vonna Drafts, FNP.   Specialty: Nurse Practitioner Why: Keep your follow up appointment. Contact information: [place] [address] [phone]               Reviewed expectations re: course of current medical issues. Questions answered. Outlined signs and symptoms indicating need for more acute intervention. Understanding verbalized. After Visit Summary given.   SUBJECTIVE: History from: patient. Penny [name] is a 39 y.o. female who reports a persistent hacking cough. Several weeks. Seen here 9/18; note reviewed. Reports: no current SOB. Does reports occasional wheezing. Requests albuterol for neb machine. Denies: fever. Normal PO intake without n/v/d. Does report belching more often with acid reflux.   OBJECTIVE:  Vitals:   08/14/20 1036  BP: (!) 124/93  Pulse: (!) 106  Resp: 18  Temp: 97.7 F (36.5 C)  TempSrc: Temporal  SpO2: 98%    General appearance: alert; no distress Eyes: PERRLA; EOMI; conjunctiva normal HENT: [place]; AT; without nasal congestion Neck: supple  Lungs: speaks full sentences without  difficulty; unlabored; dry cough; CTAB CV: reg; mild tachycardia noted Extremities: no edema Skin: warm and dry Neurologic: normal gait Psychological: alert and cooperative; normal mood and affect  Labs Reviewed: Results for orders placed or performed during the hospital encounter of 07/29/20  SARS CORONAVIRUS 2 (TAT 6-24 HRS) Nasopharyngeal Nasopharyngeal Swab   Specimen: Nasopharyngeal Swab  Result Value Ref Range   SARS Coronavirus 2 NEGATIVE NEGATIVE    Imaging: DG Chest 2 View  Result Date: 08/14/2020 CLINICAL DATA:  Shortness of breath EXAM: CHEST - 2 VIEW COMPARISON:  08/01/2020 FINDINGS: The heart size and mediastinal contours are within normal limits. No focal airspace consolidation, pleural effusion, or pneumothorax. The visualized skeletal structures are unremarkable. IMPRESSION: No active cardiopulmonary disease. Electronically Signed   By: Davina Poke D.O.   On: 08/14/2020 11:32    Allergies  Allergen Reactions  . Other Itching and Swelling    Itching and swelling in mouth and face  . Shrimp [Shellfish Allergy] Itching and Swelling    Itching and swelling in mouth and face    Past Medical History:  Diagnosis Date  . ADHD (attention deficit hyperactivity disorder)   . Anxiety    no meds  . Chronic back pain   . Depression    no meds  . Essential hypertension    a. Dx 06/2016.  . Migraines    last one over 1 month ago  . Overweight   . Palpitations - Sinus Tachycardia  a. 12/2016 Echo: E f55-60%, no rwma, nl LA/RA sizes.  . Placenta previa    w/ 2 pregnancies.  . Preterm labor    c/s at 36 wks  . Scoliosis   . Sinus tachycardia    Social History   Socioeconomic History  . Marital status: Married    Spouse name: Not on file  . Number of children: Not on file  . Years of education: Not on file  . Highest education level: Not on file  Occupational History  . Not on file  Tobacco Use  . Smoking status: Current Every Day Smoker    Packs/day:  0.10    Years: 18.00    Pack years: 1.80    Types: Cigarettes  . Smokeless tobacco: Never Used  Vaping Use  . Vaping Use: Former  Substance and Sexual Activity  . Alcohol use: Yes    Comment: Rare   . Drug use: Not Currently    Types: Marijuana  . Sexual activity: Not on file    Comment: BTL  Other Topics Concern  . Not on file  Social History Narrative   Lives in [place] with husband and three children - ages 50, 84, 20.  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  Does not routinely exercise.   Social Determinants of Health   Financial Resource Strain:   . Difficulty of Paying Living Expenses: Not on file  Food Insecurity:   . Worried About Charity fundraiser in the Last Year: Not on file  . Ran Out of Food in the Last Year: Not on file  Transportation Needs:   . Lack of Transportation (Medical): Not on file  . Lack of Transportation (Non-Medical): Not on file  Physical Activity:   . Days of Exercise per Week: Not on file  . Minutes of Exercise per Session: Not on file  Stress:   . Feeling of Stress : Not on file  Social Connections:   . Frequency of Communication with Friends and Family: Not on file  . Frequency of Social Gatherings with Friends and Family: Not on file  . Attends Religious Services: Not on file  . Active Member of Clubs or Organizations: Not on file  . Attends Archivist Meetings: Not on file  . Marital Status: Not on file  Intimate Partner Violence:   . Fear of Current or Ex-Partner: Not on file  . Emotionally Abused: Not on file  . Physically Abused: Not on file  . Sexually Abused: Not on file   Family History  Problem Relation Age of Onset  . Cancer Mother   . CAD Mother   . Hypertension Sister   . Sickle cell trait Daughter   . Asthma Son   . Asthma Paternal Grandmother   . Pseudochol deficiency Neg Hx   . Malignant hyperthermia Neg Hx   . Anesthesia problems Neg Hx   .  Hypotension Neg Hx    Past Surgical History:  Procedure Laterality Date  . CESAREAN SECTION  2010   x 1 at 36 wks in [place]  . CESAREAN SECTION  February 13, 2012  . CHROMOPERTUBATION  10/26/2015   Procedure: ATTEMPTED CHROMOPERTUBATION;  Surgeon: Crawford Givens, MD;  Location: [facility];  Service: Gynecology;;  . LAPAROSCOPIC LYSIS OF ADHESIONS  10/26/2015   Procedure: LAPAROSCOPIC LYSIS OF ADHESIONS;  Surgeon: Crawford Givens, MD;  Location: [facility];  Service: Gynecology;;  .  LAPAROSCOPY N/A 10/26/2015   Procedure: LAPAROSCOPY OPERATIVE WITH REMOVAL OF MISPLACED FILSHE CLIP;  Surgeon: Crawford Givens, MD;  Location: [facility];  Service: Gynecology;  Laterality: N/A;  . SVD  2003   x 1 [place]  . TUBAL LIGATION    . WISDOM TOOTH EXTRACTION       Vanessa Kick, MD 08/14/20 1549

## 2020-08-19 ENCOUNTER — Encounter: Payer: Self-pay | Admitting: Physical Therapy

## 2020-08-19 ENCOUNTER — Other Ambulatory Visit: Payer: Self-pay

## 2020-08-19 ENCOUNTER — Ambulatory Visit
Payer: Medicaid Other | Attending: Student in an Organized Health Care Education/Training Program | Admitting: Physical Therapy

## 2020-08-19 DIAGNOSIS — M6281 Muscle weakness (generalized): Secondary | ICD-10-CM | POA: Diagnosis present

## 2020-08-19 DIAGNOSIS — R1084 Generalized abdominal pain: Secondary | ICD-10-CM | POA: Diagnosis present

## 2020-08-19 DIAGNOSIS — R252 Cramp and spasm: Secondary | ICD-10-CM

## 2020-08-19 NOTE — Therapy (Signed)
[facility] [address] [place], [place], [address] Phone: [phone]   Fax:  [phone]  Physical Therapy Treatment  Patient Details  Name: Penny [name] MRN: [id number] Date of Birth: 1981/08/23 Referring Provider (PT): Dr. Tommie Raymond   Encounter Date: 08/19/2020   PT End of Session - 08/19/20 1147    Visit Number 3    Date for PT Re-Evaluation 10/05/20    Authorization Type medicaid    Authorization Time Period 08/19/2020-11/10/20    Authorization - Visit Number 1    Authorization - Number of Visits 12    PT Start Time 2353    PT Stop Time 1225    PT Time Calculation (min) 40 min    Activity Tolerance Patient tolerated treatment well;No increased pain    Behavior During Therapy WFL for tasks assessed/performed           Past Medical History:  Diagnosis Date  . ADHD (attention deficit hyperactivity disorder)   . Anxiety    no meds  . Chronic back pain   . Depression    no meds  . Essential hypertension    a. Dx 06/2016.  . Migraines    last one over 1 month ago  . Overweight   . Palpitations - Sinus Tachycardia    a. 12/2016 Echo: E f55-60%, no rwma, nl LA/RA sizes.  . Placenta previa    w/ 2 pregnancies.  . Preterm labor    c/s at 36 wks  . Scoliosis   . Sinus tachycardia     Past Surgical History:  Procedure Laterality Date  . CESAREAN SECTION  2010   x 1 at 36 wks in [place]  . CESAREAN SECTION  February 13, 2012  . CHROMOPERTUBATION  10/26/2015   Procedure: ATTEMPTED CHROMOPERTUBATION;  Surgeon: Crawford Givens, MD;  Location: [facility];  Service: Gynecology;;  . LAPAROSCOPIC LYSIS OF ADHESIONS  10/26/2015   Procedure: LAPAROSCOPIC LYSIS OF ADHESIONS;  Surgeon: Crawford Givens, MD;  Location: [facility];  Service: Gynecology;;  . LAPAROSCOPY N/A 10/26/2015   Procedure: LAPAROSCOPY OPERATIVE WITH REMOVAL OF MISPLACED FILSHE CLIP;  Surgeon: Crawford Givens, MD;  Location: [facility];  Service:  Gynecology;  Laterality: N/A;  . SVD  2003   x 1 in [place]  . TUBAL LIGATION    . WISDOM TOOTH EXTRACTION      There were no vitals filed for this visit.   Subjective Assessment - 08/19/20 1152    Subjective Pain came back to my stomach and sides. The lower right quadrant has increased pain.    Patient Stated Goals reduce pain    Currently in Pain? Yes    Pain Score 4     Pain Location Abdomen    Pain Orientation Right;Lower    Pain Descriptors / Indicators Burning    Pain Type Chronic pain    Pain Onset More than a month ago    Pain Frequency Intermittent    Aggravating Factors  when laying down and coughing, walking    Pain Relieving Factors massage out    Multiple Pain Sites No              [facility] PT Assessment - 08/19/20 0001      Palpation   SI assessment  ASIS are equal                         [facility] Adult PT Treatment/Exercise - 08/19/20  0001      Lumbar Exercises: Stretches   Hip Flexor Stretch Right;Left;2 reps;30 seconds    Hip Flexor Stretch Limitations one foot on higher step on stairs    ITB Stretch Right;Left;1 rep;60 seconds    ITB Stretch Limitations foam roll      Lumbar Exercises: Aerobic   Stationary Bike 5 minutes level 1 as assessing patient; had increased in right lower quadrant pain      Lumbar Exercises: Sidelying   Other Sidelying Lumbar Exercises iliacus pull back laying on the left with leg on bolster      Manual Therapy   Manual Therapy Soft tissue mobilization    Soft tissue mobilization right gluteal, right SI joint, right thoracolumbar paraspinals in left sidey; release of the right posas                    PT Short Term Goals - 08/19/20 1226      PT SHORT TERM GOAL #1   Title pt to be I with intial HEP     Time 4    Period Weeks    Status Achieved             PT Long Term Goals - 08/10/20 1443      PT LONG TERM GOAL #1   Title independent with advanced HEP    Baseline educated with different  exercises as she is advanced    Time 12    Period Weeks    Status On-going      PT LONG TERM GOAL #2   Title understand ways to manage pain with meditation, breathing, and conservation of energy    Baseline continues to be educated    Time 12    Period Weeks    Status On-going      PT LONG TERM GOAL #3   Title able to brace her abdomen during daily tasks to reduce the pain and improve her function    Baseline has not learned yet due to reducing pain    Time 12    Period Weeks    Status On-going      PT LONG TERM GOAL #4   Title able to start a walking program for 15 mintues a day due to pain decrease </= 5/10    Baseline was sick for 2 weeks so has not started walking program yet    Time 12    Period Weeks    Status On-going      PT LONG TERM GOAL #5   Title right hip strength >/= 4/5 so she is able to tolerate her daily tasks with pain decreased </= 5/10    Baseline pain level 5/10; right hip strength is 5/5    Time 12    Period Weeks    Status Partially Met                 Plan - 08/19/20 1222    Clinical Impression Statement Patient pain is now intermittent. She rports her pain is intermittent instead of constant. Patient had no pain after manual work. Patient pelvis in correct alignment. Patient did well with the foam roller and help work out the muscles. Patient will benefit from skilled therapy to understand how to manage her pain and improve her overall strength.    Comorbidities 2x cesaren section; exploratory laparotomy    Examination-Activity Limitations Toileting;Locomotion Level;Squat;Stairs;Stand;Sleep;Carry;Dressing;Bed Mobility    Examination-Participation Restrictions Community Activity;Interpersonal Relationship;Cleaning    Stability/Clinical Decision Making Evolving/Moderate  complexity    Rehab Potential Good    PT Frequency 1x / week    PT Duration 12 weeks    PT Treatment/Interventions Biofeedback;Cryotherapy;Electrical Stimulation;Moist  Heat;Ultrasound;Neuromuscular re-education;Therapeutic exercise;Therapeutic activities;Patient/family education;Manual techniques;Dry needling;Taping;Spinal Manipulations    PT Next Visit Plan stretch the hip flexors, release of the abdomen, manual work to the right abdomen; foam roller; low level abdominal engagement; go over conservation of energy; discuss walking program    PT Home Exercise Plan Access Code: [email or web address]    Consulted and Agree with Plan of Care Patient           Patient will benefit from skilled therapeutic intervention in order to improve the following deficits and impairments:  Decreased range of motion, Increased fascial restricitons, Decreased endurance, Increased muscle spasms, Pain, Decreased strength, Decreased mobility  Visit Diagnosis: Muscle weakness (generalized)  Cramp and spasm  Generalized abdominal pain     Problem List Patient Active Problem List   Diagnosis Date Noted  . Moderate persistent asthma with acute exacerbation 08/01/2020  . Uterine fibroid 01/23/2016  . Right groin pain 01/23/2016  . History of C-section 01/23/2016  . Migraines   . Anxiety   . Depression     Earlie Counts, PT 08/19/20 12:28 PM   [facility] [address], [place], [place], [address] Phone: [phone]   Fax:  [phone]  Name: BENTLI LLORENTE MRN: [id number] Date of Birth: 06-29-1981

## 2020-08-27 ENCOUNTER — Ambulatory Visit (INDEPENDENT_AMBULATORY_CARE_PROVIDER_SITE_OTHER): Payer: Medicaid Other | Admitting: Allergy & Immunology

## 2020-08-27 ENCOUNTER — Other Ambulatory Visit: Payer: Self-pay

## 2020-08-27 ENCOUNTER — Ambulatory Visit: Payer: Medicaid Other | Admitting: Physical Therapy

## 2020-08-27 ENCOUNTER — Encounter: Payer: Self-pay | Admitting: Allergy & Immunology

## 2020-08-27 VITALS — BP 118/76 | HR 97 | Temp 98.4°F | Resp 12 | Ht 64.0 in | Wt 195.4 lb

## 2020-08-27 DIAGNOSIS — K9049 Malabsorption due to intolerance, not elsewhere classified: Secondary | ICD-10-CM

## 2020-08-27 DIAGNOSIS — J4541 Moderate persistent asthma with (acute) exacerbation: Secondary | ICD-10-CM | POA: Diagnosis not present

## 2020-08-27 DIAGNOSIS — F17200 Nicotine dependence, unspecified, uncomplicated: Secondary | ICD-10-CM

## 2020-08-27 DIAGNOSIS — J310 Chronic rhinitis: Secondary | ICD-10-CM | POA: Diagnosis not present

## 2020-08-27 MED ORDER — EPINEPHRINE 0.3 MG/0.3ML IJ SOAJ
0.3000 mg | INTRAMUSCULAR | 1 refills | Status: DC | PRN
Start: 2020-08-27 — End: 2021-04-29

## 2020-08-27 MED ORDER — NICOTINE 14 MG/24HR TD PT24: 14.0000 mg | MEDICATED_PATCH | Freq: Every day | TRANSDERMAL | 0 refills | Status: DC

## 2020-08-27 NOTE — Patient Instructions (Addendum)
1. Moderate persistent asthma with acute exacerbation - Spirometry was somewhat lower today.  - We did give you a few puffs of albuterol, which I hoped help you to feel better. - We are getting labs to see if you would qualify for one of our injectable asthma medications to help you control your symptoms.  - Start the prednisone pack provided today. - Spacer use reviewed.  - Daily controller medication(s): Singulair 10mg  daily and Symbicort 160/4.11mcg two puffs twice daily with spacer - Prior to physical activity: albuterol 2 puffs 10-15 minutes before physical activity. - Rescue medications: albuterol 4 puffs every 4-6 hours as needed - Asthma control goals:  * Full participation in all desired activities (may need albuterol before activity) * Albuterol use two time or less a week on average (not counting use with activity) * Cough interfering with sleep two time or less a month * Oral steroids no more than once a year * No hospitalizations  2. Chronic rhinitis - with overlying sinusitis - We are going to get your lab results from [facility]. - Continue with the Singulair 10mg  daily. - Continue with Xyzal 5mg  daily. - Start doxycycline 100mg  twice daily for two weeks. - Take the entire course and complete it, even if you are feeling better.   3. Current smoker - We are sending in nicotine patches for now, but talk to your PCP about this going forward.  - Use one patch daily.  4. Food intolerance - We are going to get lab testing to look for shellfish allergy. - EpiPen is up to date.   5. Return in about 4 weeks (around 09/24/2020).    Please inform us of any Emergency Department visits, hospitalizations, or changes in symptoms. Call us before going to the ED for breathing or allergy symptoms since we might be able to fit you in for a sick visit. Feel free to contact us anytime with any questions, problems, or concerns.  It was a pleasure to see you and your family again  today!  Websites that have reliable patient information: 1. American Academy of Asthma, Allergy, and Immunology: www.aaaai.org 2. Food Allergy Research and Education (FARE): foodallergy.org 3. Mothers of Asthmatics: http://www.asthmacommunitynetwork.org 4. American College of Allergy, Asthma, and Immunology: www.acaai.org   COVID-19 Vaccine Information can be found at: ShippingScam.co.uk For questions related to vaccine distribution or appointments, please email [email or web address] or call [phone].     "Like" [place] on Facebook and Instagram for our latest updates!     HAPPY FALL!     Make sure you are registered to vote! If you have moved or changed any of your contact information, you will need to get this updated before voting!  In some cases, you MAY be able to register to vote online: [email or web address]

## 2020-08-27 NOTE — Progress Notes (Addendum)
NEW PATIENT  Date of Service/Encounter:  08/27/20  Referring provider: Vonna Drafts, FNP   Assessment:   Moderate persistent asthma with acute exacerbation - with a total IgE of 1164 and an absolute eosinophil count of 300  Perennial and seasonal allergic rhinitis (grasses, weeds, ragweed, trees, outdoor molds, dust mites, cat, dog, roach)  Current smoker  Food intolerance   Penny [name] presents to establish care.  She has not had a long standing diagnosis of asthma, but it is quite possible that she has been under perceiver and this recent ER visit has posted to the forefront.  We are going to continue her with the Symbicort and the Singulair.  We also putting her on a low-dose of prednisone to help get her through this current exacerbation.  She seems to not know too much about asthma, which is surprising given that she has 4 kids that have asthma and her seen by [place].  Regardless, she does ask a lot of questions about the diagnosis and we spend a lot of time explaining the prognosis as well as the treatment options.  We are going to get some lab work to see if she would qualify for one of her injectable meds.  We already know that her IgE qualifies her for Xolair, so we will likely go with that.  She has already had environmental allergy testing, so there is no need to repeat this.  She is somewhat interested in allergen immunotherapy, as the rest of her family also gets allergy shots here.  She is going to check and see how much it could be with her Medicaid.  Plan/Recommendations:   1. Moderate persistent asthma with acute exacerbation - Spirometry was somewhat lower today.  - We did give you a few puffs of albuterol, which I hoped help you to feel better. - We are getting labs to see if you would qualify for one of our injectable asthma medications to help you control your symptoms.  - Start the prednisone pack provided today. - Spacer use reviewed.  - Daily controller  medication(s): Singulair 39m daily and Symbicort 160/4.527m two puffs twice daily with spacer - Prior to physical activity: albuterol 2 puffs 10-15 minutes before physical activity. - Rescue medications: albuterol 4 puffs every 4-6 hours as needed - Asthma control goals:  * Full participation in all desired activities (may need albuterol before activity) * Albuterol use two time or less a week on average (not counting use with activity) * Cough interfering with sleep two time or less a month * Oral steroids no more than once a year * No hospitalizations  2. Chronic rhinitis - with overlying sinusitis - We are going to get your lab results from [facility]- Continue with the Singulair 1044maily. - Continue with Xyzal 5mg47mily. - Start doxycycline 100mg44mce daily for two weeks. - Take the entire course and complete it, even if you are feeling better.   3. Current smoker - We are sending in nicotine patches for now, but talk to your PCP about this going forward.  - Use one patch daily.  4. Food intolerance - We are going to get lab testing to look for shellfish allergy. - EpiPen is up to date.   5. Return in about 4 weeks (around 09/24/2020).    Total of 60 minutes, greater than 50% of which was spent in discussion of treatment and management options.    Subjective:   Penny [name] 38 y.19  female presenting today for evaluation of  Chief Complaint  Patient presents with  . Asthma    Patient says she was diagnosed with asthma on August 01, 2020. She says she was sent home with Symbicort and a rescue inhaler. She says she was having shortness of breath and coughing spells. Patient said last night she woke up out of her sleep coughing until she vomited. She is currently having shortness of breath, coughing, and headaches    Penny [name] has a history of the following: Patient Active Problem List   Diagnosis Date Noted  . Moderate persistent asthma with acute  exacerbation 08/01/2020  . Uterine fibroid 01/23/2016  . Right groin pain 01/23/2016  . History of C-section 01/23/2016  . Migraines   . Anxiety   . Depression     History obtained from: chart review and patient.  Penny [name] was referred by Vonna Drafts, FNP.     Penny [name] is a 39 y.o. female presenting for an evaluation of allergies and asthma as well as food allergies.  Her 4 children are all seen here and have allergies, asthma, and food allergies.   Asthma/Respiratory Symptom History: She has had intermittent wheezing but she has never been diagnosed with asthma. The only time that she wheezed was when she had the flu. She can have some wheezing when she runs. As a child she never had problems with asthma. She was tested for COVID19 one month ago. She was placed on steroids with improvement in her symptoms. Her son had similar symptoms.   CXR was negative on 08/01/2020. It was "tight" that day and she was wheezing. This was all a cough, she has never had a fever. She initially got sick in July and her cough has been persistent since July.  She was tested for COVID19 on July 14th. This was negative. She tells me that she has had likely undiagnosed asthma for years, at least "according to the chest X-ray", whatever that means. She has never seen an allergist or pulmonologist.  Allergic Rhinitis Symptom History: She was on alleryg shots when she was a child. She "knows for a fact" that she is allergic to dust mites and "stuff on the ground" with horses. She takes Xyzal and Singulair right now, which was [facility].    She smokes less than a pack per day. She is working on quitting. She is trying really hard to quit. She is working on starting patches.   Food Allergy Symptom History: She reports that she has some throat swelling with shrimp and other shellfish. She has never been tested.  She does not have an up-to-date EpiPen.  Otherwise, there is no history of other  atopic diseases, including drug allergies, stinging insect allergies, eczema, urticaria or contact dermatitis. There is no significant infectious history. Vaccinations are up to date.    Past Medical History: Patient Active Problem List   Diagnosis Date Noted  . Moderate persistent asthma with acute exacerbation 08/01/2020  . Uterine fibroid 01/23/2016  . Right groin pain 01/23/2016  . History of C-section 01/23/2016  . Migraines   . Anxiety   . Depression     Medication List:  Allergies as of 08/27/2020      Reactions   Other Itching, Swelling   Itching and swelling in mouth and face   Shrimp [shellfish Allergy] Itching, Swelling   Itching and swelling in mouth and face      Medication List  Accurate as of August 27, 2020 11:28 AM. If you have any questions, ask your nurse or doctor.        Accu-Chek Guide test strip Generic drug: glucose blood USE 1 strip 2 TIMES DAILY BEFORE MEALS   Accu-Chek Guide w/Device Kit See admin instructions.   Accu-Chek Softclix Lancets lancets 2 (two) times daily.   Aimovig 140 MG/ML Soaj Generic drug: Erenumab-aooe Inject 1,140 mg into the skin every 30 (thirty) days.   Aimovig 140 MG/ML Soaj Generic drug: Erenumab-aooe Aimovig Autoinjector 140 mg/mL subcutaneous auto-injector  INJECT 1 ML BELOW THE SKIN ONCE A MONTH   albuterol (2.5 MG/3ML) 0.083% nebulizer solution Commonly known as: PROVENTIL Take 3 mLs (2.5 mg total) by nebulization every 6 (six) hours as needed for wheezing or shortness of breath.   atomoxetine 80 MG capsule Commonly known as: STRATTERA Take 80 mg by mouth daily.   atomoxetine 80 MG capsule Commonly known as: STRATTERA Take by mouth.   benzonatate 100 MG capsule Commonly known as: Tessalon Perles Take 1 capsule (100 mg total) by mouth 2 (two) times daily as needed for cough.   budesonide-formoterol 160-4.5 MCG/ACT inhaler Commonly known as: Symbicort Inhale 2 puffs into the lungs in the  morning and at bedtime.   butalbital-acetaminophen-caffeine 50-325-40 MG tablet Commonly known as: FIORICET Take 1-2 tablets by mouth every 6 (six) hours as needed for headache.   diclofenac 75 MG EC tablet Commonly known as: VOLTAREN Take 75 mg by mouth 2 (two) times daily.   DULoxetine 30 MG capsule Commonly known as: CYMBALTA Take 30 mg by mouth daily. Pt takes with the cymbalta 66m for a total of 90 mg   DULoxetine 60 MG capsule Commonly known as: CYMBALTA Take 60 mg by mouth daily. Pt takes with cymbalta 30 mg for a total of 90 mg   DULoxetine 60 MG capsule Commonly known as: CYMBALTA Take by mouth.   DULoxetine 30 MG capsule Commonly known as: CYMBALTA Take 1 capsule by mouth at bedtime.   EPINEPHrine 0.3 mg/0.3 mL Soaj injection Commonly known as: EPI-PEN SMARTSIG:1 Syringe(s) IM PRN   esomeprazole 40 MG capsule Commonly known as: NEXIUM Take 1 capsule (40 mg total) by mouth daily.   gabapentin 800 MG tablet Commonly known as: NEURONTIN Take 800 mg by mouth 3 (three) times daily.   ipratropium 0.03 % nasal spray Commonly known as: ATROVENT Place 2 sprays into both nostrils every 12 (twelve) hours.   levocetirizine 5 MG tablet Commonly known as: Xyzal Take 1 tablet (5 mg total) by mouth every evening.   metoprolol succinate 25 MG 24 hr tablet Commonly known as: TOPROL-XL Take 25 mg by mouth 2 (two) times daily.   Mirena (52 MG) 20 MCG/24HR IUD Generic drug: levonorgestrel Mirena 20 mcg/24 hours (7 yrs) 52 mg intrauterine device  Take by intrauterine route.   montelukast 10 MG tablet Commonly known as: Singulair Take 1 tablet (10 mg total) by mouth at bedtime.   nicotine 14 mg/24hr patch Commonly known as: NICODERM CQ - dosed in mg/24 hours Place 1 patch (14 mg total) onto the skin daily. Started by: JValentina Shaggy MD   promethazine 25 MG tablet Commonly known as: PHENERGAN Take 1 tablet (25 mg total) by mouth every 6 (six) hours as  needed for up to 3 days for nausea or vomiting.   promethazine 12.5 MG tablet Commonly known as: PHENERGAN Take 1 tablet (12.5 mg total) by mouth every 6 (six) hours as needed for nausea or  vomiting.   RyVent 6 MG Tabs Generic drug: Carbinoxamine Maleate TAKE 1 TABLET BY MOUTH EVERY DAY FOR ALLERGIES   tiZANidine 4 MG tablet Commonly known as: ZANAFLEX Take 4 mg by mouth every 6 (six) hours as needed for muscle spasms.       Birth History: non-contributory  Developmental History: non-contributory  Past Surgical History: Past Surgical History:  Procedure Laterality Date  . CESAREAN SECTION  2010   x 1 at 36 wks in [place]  . CESAREAN SECTION  February 13, 2012  . CHROMOPERTUBATION  10/26/2015   Procedure: ATTEMPTED CHROMOPERTUBATION;  Surgeon: Crawford Givens, MD;  Location: [facility];  Service: Gynecology;;  . LAPAROSCOPIC LYSIS OF ADHESIONS  10/26/2015   Procedure: LAPAROSCOPIC LYSIS OF ADHESIONS;  Surgeon: Crawford Givens, MD;  Location: [facility];  Service: Gynecology;;  . LAPAROSCOPY N/A 10/26/2015   Procedure: LAPAROSCOPY OPERATIVE WITH REMOVAL OF MISPLACED FILSHE CLIP;  Surgeon: Crawford Givens, MD;  Location: [facility];  Service: Gynecology;  Laterality: N/A;  . SVD  2003   x 1 in [place]  . TUBAL LIGATION    . WISDOM TOOTH EXTRACTION       Family History: Family History  Problem Relation Age of Onset  . Cancer Mother   . CAD Mother   . Hypertension Sister   . Sickle cell trait Daughter   . Asthma Son   . Asthma Paternal Grandmother   . Pseudochol deficiency Neg Hx   . Malignant hyperthermia Neg Hx   . Anesthesia problems Neg Hx   . Hypotension Neg Hx      Social History: Mili lives at home with her 3 children.  They live in an apartment.  There is wood paneling throughout the home.  She has electric heating and window units as well as fans for cooling.  There is a dog, rabbit, and Chinchilla lives in the home.  There are no dust mite covers on the bedding.  There is  tobacco exposure in the house, but not the car.  Mom smokes around 10 cigarettes/day.  She is not exposed to fumes, chemicals, or dust in her home or workplace.  She has smoked since 2000.  She does not use a HEPA filter.  [place] or [place].   Review of Systems  Constitutional: Negative.  Negative for chills, fever, malaise/fatigue and weight loss.  HENT: Positive for congestion and sinus pain. Negative for ear discharge and ear pain.        Positive for postnasal drip.  Positive for throat clearing.  Eyes: Negative for pain, discharge and redness.  Respiratory: Positive for cough, shortness of breath and wheezing. Negative for sputum production.   Cardiovascular: Negative.  Negative for chest pain and palpitations.  Gastrointestinal: Negative for abdominal pain, constipation, diarrhea, heartburn, nausea and vomiting.  Skin: Negative.  Negative for itching and rash.  Neurological: Negative for dizziness and headaches.  Endo/Heme/Allergies: Positive for environmental allergies. Does not bruise/bleed easily.       Objective:   Blood pressure 118/76, pulse 97, temperature 98.4 F (36.9 C), resp. rate 12, height '5\' 4"'  (1.626 m), weight 195 lb 6.4 oz (88.6 kg), SpO2 98 %. Body mass index is 33.54 kg/m.   Physical Exam:   Physical Exam Constitutional:      Appearance: She is well-developed.     Comments: Very talkative. Mild-moderate distress at first but calmed down during the visit.  Lots of questions.  HENT:     Head: Normocephalic  and atraumatic.     Right Ear: Tympanic membrane, ear canal and external ear normal. No drainage, swelling or tenderness. Tympanic membrane is not injected, scarred, erythematous, retracted or bulging.     Left Ear: Tympanic membrane, ear canal and external ear normal. No drainage, swelling or tenderness. Tympanic membrane is not injected, scarred, erythematous, retracted or bulging.     Ears:     Comments: Scant cerumen  bilaterally, but the TMs are visualized.    Nose: No nasal deformity, septal deviation, mucosal edema or rhinorrhea.     Right Sinus: No maxillary sinus tenderness or frontal sinus tenderness.     Left Sinus: No maxillary sinus tenderness or frontal sinus tenderness.     Mouth/Throat:     Mouth: Mucous membranes are not pale and not dry.     Pharynx: Uvula midline.  Eyes:     General:        Right eye: No discharge.        Left eye: No discharge.     Conjunctiva/sclera: Conjunctivae normal.     Right eye: Right conjunctiva is not injected. No chemosis.    Left eye: Left conjunctiva is not injected. No chemosis.    Pupils: Pupils are equal, round, and reactive to light.  Cardiovascular:     Rate and Rhythm: Normal rate and regular rhythm.     Heart sounds: Normal heart sounds.  Pulmonary:     Effort: Pulmonary effort is normal. No tachypnea, accessory muscle usage or respiratory distress.     Breath sounds: Normal breath sounds. No wheezing, rhonchi or rales.     Comments: Coarse upper airway sounds throughout.  No crackles.  Somewhat decreased air movement at the bases.  Tachypneic initially, but this improved over the course of the visit. Chest:     Chest wall: No tenderness.  Abdominal:     Tenderness: There is no abdominal tenderness. There is no guarding or rebound.  Lymphadenopathy:     Head:     Right side of head: No submandibular, tonsillar or occipital adenopathy.     Left side of head: No submandibular, tonsillar or occipital adenopathy.     Cervical: No cervical adenopathy.  Skin:    Coloration: Skin is not pale.     Findings: No abrasion, erythema, petechiae or rash. Rash is not papular, urticarial or vesicular.     Comments: No eczematous or urticarial lesions noted.  Neurological:     Mental Status: She is alert.  Psychiatric:        Behavior: Behavior is cooperative.      Diagnostic studies: labs sent  Spirometry: results abnormal (FEV1: 1.83/71%, FVC:  2.26/72%, FEV1/FVC: 81%).    Spirometry consistent with possible restrictive disease. Xopenex four puffs via MDI treatment given in clinic with symptomatic improvement, although we did not do a post spirometry..  Allergy Studies: none           Salvatore Marvel, MD [facility]

## 2020-08-31 ENCOUNTER — Telehealth: Payer: Self-pay | Admitting: *Deleted

## 2020-08-31 LAB — ALLERGEN PROFILE, SHELLFISH
Clam IgE: <0.1 kU/L
F023-IgE Crab: 3.85 kU/L — AB
F080-IgE Lobster: 3.03 kU/L — AB
F290-IgE Oyster: <0.1 kU/L
Scallop IgE: 0.76 kU/L — AB
Shrimp IgE: 4.25 kU/L — AB

## 2020-08-31 NOTE — Telephone Encounter (Signed)
Called patient and discussed Xolair with patient and she does want to start Xolair. Will start process and reach out to patient to to schedule once delivery is set. She does want to allergy injections after starting Xolair.  She is requesting Xopenex as rescue inhaler instead of albuterol. I advised her Dr Ernst Bowler not working today and she advised it can be addressed tomorrow. She advises heart palpitations with albuterol.  She also advised her sons Symbicort wasn't sent to pharmacy and I sent same in. Also discussed at length her lab results and to avoid shellfish due to results. I advised her she did have a few labs still pending

## 2020-09-01 ENCOUNTER — Other Ambulatory Visit: Payer: Self-pay | Admitting: *Deleted

## 2020-09-01 ENCOUNTER — Telehealth: Payer: Self-pay

## 2020-09-01 ENCOUNTER — Encounter: Payer: Self-pay | Admitting: *Deleted

## 2020-09-01 MED ORDER — XOPENEX HFA 45 MCG/ACT IN AERO
2.0000 | INHALATION_SPRAY | RESPIRATORY_TRACT | 1 refills | Status: DC | PRN
Start: 2020-09-01 — End: 2021-04-29

## 2020-09-01 NOTE — Telephone Encounter (Signed)
Patient called back and wanted to know if Dr. Ernst Bowler had responded to her message. Patient was informed he was still seeing patient's and would get a call when he does respond. Patient states she is having issues right now and she has used [employer]mer, systems.  Please advise.

## 2020-09-01 NOTE — Telephone Encounter (Signed)
Patient called and states she is very confused about her lab test results. She wants to know which each of them mean and the levels. She also wants to know what she needs to be avoiding. Patient also states she was very confused in regards to the mychart message that was sent to her about her labs. Why does she need to be on a biologic and if she needs to stop her symbicort. Please advice. Thank you

## 2020-09-01 NOTE — Telephone Encounter (Signed)
I apologize that my messages were confusing.  I think there is some dragon issues.  Nevertheless, below are my more extended explanations of her labs.  #1.  Shellfish panel: She was completely negative to clam and oyster.  Therefore, she can eat clams and oysters without a problem.  She was positive to shrimp, crab, scallop, and lobster.  When I say positive, it means that her immune system makes IgE or allergy antibody to these foods.  Therefore, she should avoid these foods.  #2Rubbie Battiest panel: This test lookd for IgE or allergy antibodies to various molds.  She was positive to a variety of indoor and outdoor molds.  These will help with determining what to put in her allergy shot vials.  We are not starting allergy shots until her asthma is under better control, however.  Therefore, we will not start allergy shots until after her next visit.  #3.  IgE level: As mentioned above IgE is an allergy antibody.  This measures your total amount of IgE in the blood.  The only reason I sent this was to be able to dose Xolair for control of her asthma, since this dosing is based on total IgE levels.  Xolair is an asthma medication that is given to help with asthma, hives, indoor nasal polyps.  We are going to be starting it for control of her asthma.  Based on this number, she will get Xolair every 2 weeks.  #4.  Complete blood count: This test looked at the numbers of blood cells, including both red blood cells and white blood cells.  I was more interested in the white blood cells, specifically the eosinophils.  These are a specific type of white blood cell that make asthma and allergies worse.  She did have an elevated number of eosinophils in her blood, which would qualify her for a different class of injectable asthma medications.  However, we are starting the Xolair as mentioned above.  Her complete blood count did show an elevated white blood cell count with an elevated number of neutrophils.  Neutrophils are a  specific type of white blood cell that go up when people have viruses or bacterial infections.  We can certainly recheck these in the future, but this is nothing we need to address emergently.  We did treat her with doxycycline at the last visit, which should have taken care of any infection that she had.  #5.  ANCA titers: We sent these because there are certain disease states that make asthma more difficult to control.  It is really a nonissue now because they were negative, so we do not need to go into detail about these.  #6.  Alpha 1 antitrypsin: We sent this to screen for something called alpha 1 antitrypsin deficiency.  This is another cause of difficult to control asthma.  However, the level was normal so there is nothing further we need to do.  #7.  Aspergillus precipitants: These are still pending.  We sent these to look for yet a different cause of difficult to control asthma.  We will let you know when these return, but I conjecture or predict that these are going to be normal. We will contact you with the results of the testing.   If she wants more detail than this, she needs to make an appointment.  Salvatore Marvel, MD [facility]

## 2020-09-01 NOTE — Telephone Encounter (Signed)
I probably confuse her with the messages sent today then!  You should stick with Xolair.  Can you call her and ask her to ignore the MyChart message I sent about starting Berna Bue?  Salvatore Marvel, MD [facility]

## 2020-09-02 ENCOUNTER — Encounter: Payer: Self-pay | Admitting: Physical Therapy

## 2020-09-02 ENCOUNTER — Other Ambulatory Visit: Payer: Self-pay

## 2020-09-02 ENCOUNTER — Ambulatory Visit: Payer: Medicaid Other | Admitting: Physical Therapy

## 2020-09-02 DIAGNOSIS — M6281 Muscle weakness (generalized): Secondary | ICD-10-CM

## 2020-09-02 DIAGNOSIS — R1084 Generalized abdominal pain: Secondary | ICD-10-CM

## 2020-09-02 DIAGNOSIS — R252 Cramp and spasm: Secondary | ICD-10-CM

## 2020-09-02 NOTE — Therapy (Signed)
[facility] [address], [place], [place], [address] Phone: [phone]   Fax:  [phone]  Physical Therapy Treatment  Patient Details  Name: Penny [name] MRN: [id number] Date of Birth: 22-Dec-1980 Referring Provider (PT): Dr. Tommie Raymond   Encounter Date: 09/02/2020   PT End of Session - 09/02/20 0841    Visit Number 4    Date for PT Re-Evaluation 10/05/20    Authorization Type medicaid    Authorization Time Period 08/19/2020-11/10/20    Authorization - Visit Number 2    Authorization - Number of Visits 12    PT Start Time 0800    PT Stop Time 0840    PT Time Calculation (min) 40 min    Activity Tolerance Patient tolerated treatment well;No increased pain    Behavior During Therapy WFL for tasks assessed/performed           Past Medical History:  Diagnosis Date  . ADHD (attention deficit hyperactivity disorder)   . Anxiety    no meds  . Asthma   . Chronic back pain   . Depression    no meds  . Essential hypertension    a. Dx 06/2016.  . Migraines    last one over 1 month ago  . Overweight   . Palpitations - Sinus Tachycardia    a. 12/2016 Echo: E f55-60%, no rwma, nl LA/RA sizes.  . Placenta previa    w/ 2 pregnancies.  . Preterm labor    c/s at 36 wks  . Scoliosis   . Sinus tachycardia     Past Surgical History:  Procedure Laterality Date  . CESAREAN SECTION  2010   x 1 at 36 wks in [place]  . CESAREAN SECTION  February 13, 2012  . CHROMOPERTUBATION  10/26/2015   Procedure: ATTEMPTED CHROMOPERTUBATION;  Surgeon: Crawford Givens, MD;  Location: [facility];  Service: Gynecology;;  . LAPAROSCOPIC LYSIS OF ADHESIONS  10/26/2015   Procedure: LAPAROSCOPIC LYSIS OF ADHESIONS;  Surgeon: Crawford Givens, MD;  Location: [facility];  Service: Gynecology;;  . LAPAROSCOPY N/A 10/26/2015   Procedure: LAPAROSCOPY OPERATIVE WITH REMOVAL OF MISPLACED FILSHE CLIP;  Surgeon: Crawford Givens, MD;  Location: [facility];   Service: Gynecology;  Laterality: N/A;  . SVD  2003   x 1 in [place]  . TUBAL LIGATION    . WISDOM TOOTH EXTRACTION      There were no vitals filed for this visit.   Subjective Assessment - 09/02/20 0803    Subjective After the treatment I felt good but with my breathing issues make it worse.    Patient Stated Goals reduce pain    Currently in Pain? Yes    Pain Score 5     Pain Location Abdomen    Pain Orientation Right;Lower    Pain Descriptors / Indicators Burning    Pain Type Chronic pain    Pain Onset More than a month ago    Pain Frequency Intermittent    Aggravating Factors  when laying down and coughing, walking, touching the area    Pain Relieving Factors massage it out    Multiple Pain Sites No              [facility] PT Assessment - 09/02/20 0001      Assessment   Medical Diagnosis R10.2 Pelvic pain in female    Referring Provider (PT) Dr. Tommie Raymond    Onset Date/Surgical Date 02/13/12    Hand Dominance  Right    Prior Therapy none                         [facility] Adult PT Treatment/Exercise - 09/02/20 0001      Self-Care   Self-Care Other Self-Care Comments    Other Self-Care Comments  discussed with patient on doing meditation in reclined position since she is not able to lay flat; discussed with patient on walking program but she is still getting her asthma under control      Lumbar Exercises: Stretches   Hip Flexor Stretch Right;Left;1 rep;60 seconds    Hip Flexor Stretch Limitations prone on foam roll    Piriformis Stretch Right;Left;1 rep;60 seconds    Piriformis Stretch Limitations sitting on foam roll    Other Lumbar Stretch Exercise foam roll perpendicular to the Thoracic lumbar junction    Other Lumbar Stretch Exercise sitting lumbar rotation in indian sit; manually stretched the right hip flexor with holding the sacrum in a downward movement      Manual Therapy   Manual Therapy Joint mobilization;Soft tissue mobilization;Myofascial  release    Joint Mobilization Pa and rotational mobilization to T4-L5 grade 3, gapping of the left SI joint with  moving the right hip into internal rotation; mobilization of the right rib cage sidely over bolster and prone with breath    Soft tissue mobilization using the addaday to the right gluteal, along the right SI joint, right , right quadratus, along the thoracic lumbar paraspinals    Myofascial Release using the suction cup along the right lateral trunk and abdominals as she lays on the foam roll on the left side                    PT Short Term Goals - 08/19/20 1226      PT SHORT TERM GOAL #1   Title pt to be I with intial HEP     Time 4    Period Weeks    Status Achieved             PT Long Term Goals - 09/02/20 0805      PT LONG TERM GOAL #1   Title independent with advanced HEP    Baseline educated with different exercises as she is advanced    Time 12    Period Weeks    Status On-going      PT LONG TERM GOAL #2   Title understand ways to manage pain with meditation, breathing, and conservation of energy    Baseline continues to be educated    Time 12    Period Weeks    Status On-going      PT LONG TERM GOAL #3   Title able to brace her abdomen during daily tasks to reduce the pain and improve her function    Baseline has not learned yet due to reducing pain    Time 12    Period Weeks    Status On-going      PT LONG TERM GOAL #4   Title able to start a walking program for 15 mintues a day due to pain decrease </= 5/10    Baseline not able to yet due to the asthma is still not controlled    Time 12    Period Weeks    Status On-going      PT LONG TERM GOAL #5   Title right hip strength >/= 4/5 so she is able  to tolerate her daily tasks with pain decreased </= 5/10    Baseline pain level 5/10; right hip strength is 5/5    Time 12    Period Weeks    Status Partially Met                 Plan - 09/02/20 0841    Clinical Impression  Statement After therapy patient had no pain. She continues to have difficulty with getting her asthrm under control so she is not on a walking program. Patient was instructed in other positions to lay for meditation due ther her breathing issues. Patient has fascial tightness in the right trunk. Patient has tightness in the right rib cage. Patient will benefit from skilled therapy to undertand ways to manage her pain and improve overall strength and mobility.    Personal Factors and Comorbidities Comorbidity 3+;Age;Fitness;Sex    Comorbidities 2x cesaren section; exploratory laparotomy    Examination-Activity Limitations Toileting;Locomotion Level;Squat;Stairs;Stand;Sleep;Carry;Dressing;Bed Mobility    Examination-Participation Restrictions Community Activity;Interpersonal Relationship;Cleaning    Stability/Clinical Decision Making Evolving/Moderate complexity    Rehab Potential Good    PT Frequency 1x / week    PT Duration 12 weeks    PT Treatment/Interventions Biofeedback;Cryotherapy;Electrical Stimulation;Moist Heat;Ultrasound;Neuromuscular re-education;Therapeutic exercise;Therapeutic activities;Patient/family education;Manual techniques;Dry needling;Taping;Spinal Manipulations    PT Next Visit Plan continue with foam roll, work on abdominal strength    PT Home Exercise Plan Access Code: [email or web address]    [email or web address] and Agree with Plan of Care Patient           Patient will benefit from skilled therapeutic intervention in order to improve the following deficits and impairments:  Decreased range of motion, Increased fascial restricitons, Decreased endurance, Increased muscle spasms, Pain, Decreased strength, Decreased mobility  Visit Diagnosis: Muscle weakness (generalized)  Cramp and spasm  Generalized abdominal pain     Problem List Patient Active Problem List   Diagnosis Date Noted  . Moderate persistent asthma with acute exacerbation 08/01/2020  . Uterine fibroid 01/23/2016  .  Right groin pain 01/23/2016  . History of C-section 01/23/2016  . Migraines   . Anxiety   . Depression     Earlie Counts, PT 09/02/20 8:45 AM   [facility] [address] [place], [place], [address] Phone: [phone]   Fax:  [phone]  Name: Penny [name] MRN: [id number] Date of Birth: 1981-01-21

## 2020-09-07 ENCOUNTER — Ambulatory Visit: Payer: Medicaid Other | Admitting: Allergy and Immunology

## 2020-09-10 ENCOUNTER — Ambulatory Visit: Payer: Medicaid Other | Admitting: Physical Therapy

## 2020-09-10 ENCOUNTER — Other Ambulatory Visit: Payer: Self-pay

## 2020-09-10 ENCOUNTER — Ambulatory Visit: Payer: Medicaid Other | Admitting: Allergy & Immunology

## 2020-09-10 ENCOUNTER — Encounter: Payer: Self-pay | Admitting: Physical Therapy

## 2020-09-10 DIAGNOSIS — R1084 Generalized abdominal pain: Secondary | ICD-10-CM

## 2020-09-10 DIAGNOSIS — M6281 Muscle weakness (generalized): Secondary | ICD-10-CM

## 2020-09-10 DIAGNOSIS — R252 Cramp and spasm: Secondary | ICD-10-CM

## 2020-09-10 NOTE — Therapy (Signed)
[facility] [address] [place], [place], [address] Phone: [phone]   Fax:  [phone]  Physical Therapy Treatment  Patient Details  Name: Penny [name] MRN: [id number] Date of Birth: 1980/11/16 Referring Provider (PT): Dr. Tommie Raymond   Encounter Date: 09/10/2020   PT End of Session - 09/10/20 1226    Visit Number 5    Date for PT Re-Evaluation 10/05/20    Authorization Type medicaid    Authorization Time Period 08/19/2020-11/10/20    Authorization - Visit Number 3    Authorization - Number of Visits 12    PT Start Time 0940    PT Stop Time 1223    PT Time Calculation (min) 38 min    Activity Tolerance Patient tolerated treatment well;No increased pain    Behavior During Therapy WFL for tasks assessed/performed           Past Medical History:  Diagnosis Date  . ADHD (attention deficit hyperactivity disorder)   . Anxiety    no meds  . Asthma   . Chronic back pain   . Depression    no meds  . Essential hypertension    a. Dx 06/2016.  . Migraines    last one over 1 month ago  . Overweight   . Palpitations - Sinus Tachycardia    a. 12/2016 Echo: E f55-60%, no rwma, nl LA/RA sizes.  . Placenta previa    w/ 2 pregnancies.  . Preterm labor    c/s at 36 wks  . Scoliosis   . Sinus tachycardia     Past Surgical History:  Procedure Laterality Date  . CESAREAN SECTION  2010   x 1 at 36 wks in [place]  . CESAREAN SECTION  February 13, 2012  . CHROMOPERTUBATION  10/26/2015   Procedure: ATTEMPTED CHROMOPERTUBATION;  Surgeon: Crawford Givens, MD;  Location: [facility];  Service: Gynecology;;  . LAPAROSCOPIC LYSIS OF ADHESIONS  10/26/2015   Procedure: LAPAROSCOPIC LYSIS OF ADHESIONS;  Surgeon: Crawford Givens, MD;  Location: [facility];  Service: Gynecology;;  . LAPAROSCOPY N/A 10/26/2015   Procedure: LAPAROSCOPY OPERATIVE WITH REMOVAL OF MISPLACED FILSHE CLIP;  Surgeon: Crawford Givens, MD;  Location: [facility];   Service: Gynecology;  Laterality: N/A;  . SVD  2003   x 1 [place]  . TUBAL LIGATION    . WISDOM TOOTH EXTRACTION      There were no vitals filed for this visit.   Subjective Assessment - 09/10/20 1146    Subjective I felt very good after last visit. I felt like my whole body felt relaxed. I felt like I could breath easier. I saw my MD and wants to keep everything the same.    Patient Stated Goals reduce pain    Currently in Pain? Yes    Pain Score 2     Pain Location Abdomen    Pain Orientation Right;Lower    Pain Descriptors / Indicators Burning    Pain Type Chronic pain    Pain Onset More than a month ago    Pain Frequency Intermittent    Aggravating Factors  when laying down and coughing, walking, touching the area    Pain Relieving Factors massage it out    Multiple Pain Sites No              [facility] PT Assessment - 09/10/20 0001      Assessment   Medical Diagnosis R10.2 Pelvic pain in female  Referring Provider (PT) Dr. Tommie Raymond    Onset Date/Surgical Date 02/13/12    Hand Dominance Right    Prior Therapy none      Precautions   Precautions None      AROM   Lumbar Flexion full    Lumbar Extension full    Lumbar - Right Side Bend full      Strength   Right Hip Flexion 5/5    Right Hip Extension 5/5    Right Hip External Rotation  5/5    Right Hip ABduction 5/5    Right Hip ADduction 5/5                         [facility] Adult PT Treatment/Exercise - 09/10/20 0001      Lumbar Exercises: Stretches   Active Hamstring Stretch Right;Left;1 rep;30 seconds    Active Hamstring Stretch Limitations supine with strap    Lower Trunk Rotation 2 reps;30 seconds    Lower Trunk Rotation Limitations each side with indian sit and one knee up    Hip Flexor Stretch Right;Left;1 rep;30 seconds    Hip Flexor Stretch Limitations prone on elbows with strap    ITB Stretch Right;Left;1 rep;30 seconds    ITB Stretch Limitations supine with strap    Other  Lumbar Stretch Exercise supine hip adductor stretch with strap in supine bil. 30 sec; Butterfly stretch      Lumbar Exercises: Aerobic   Stationary Bike 6.5 minutes level 1 as assessing patient; had increased in right lower quadrant pain      Manual Therapy   Manual Therapy Soft tissue mobilization    Soft tissue mobilization using the addaday to the right gluteal, along the right SI joint, right , right quadratus, along the thoracic lumbar paraspinals; release of the right psoas in prone                    PT Short Term Goals - 08/19/20 1226      PT SHORT TERM GOAL #1   Title pt to be I with intial HEP     Time 4    Period Weeks    Status Achieved             PT Long Term Goals - 09/10/20 1223      PT LONG TERM GOAL #1   Title independent with advanced HEP    Baseline educated with different exercises as she is advanced    Time 12    Period Weeks    Status On-going      PT LONG TERM GOAL #2   Title understand ways to manage pain with meditation, breathing, and conservation of energy    Baseline continues to be educated    Time 12    Period Weeks    Status Achieved      PT LONG TERM GOAL #3   Title able to brace her abdomen during daily tasks to reduce the pain and improve her function    Baseline has not learned yet due to reducing pain    Time 12    Period Weeks    Status On-going      PT LONG TERM GOAL #4   Title able to start a walking program for 15 mintues a day due to pain decrease </= 5/10    Baseline back pain decreased by 40% .    Time 12    Period Weeks  Status On-going      PT LONG TERM GOAL #5   Title right hip strength >/= 4/5 so she is able to tolerate her daily tasks with pain decreased </= 5/10    Baseline pain level 5/10; right hip strength is 5/5    Time 12    Period Weeks    Status Partially Met                 Plan - 09/10/20 1226    Clinical Impression Statement Patient back pain with walking decreased by 40%.  Patient has no pain in right abdominal after manual work. Patient hip strength is 5/5. She has difficulty with walking for 15 mintues due to working on her asthma. Patient has tightness in the right psoas, gluteal and lateral right hip. Patient was able to do the bike for 6.5 sec. Patient will benefit from skilled therapy to understand ways to manage her pain and improve overall strength and mobility.    Personal Factors and Comorbidities Comorbidity 3+;Age;Fitness;Sex    Comorbidities 2x cesaren section; exploratory laparotomy    Examination-Activity Limitations Toileting;Locomotion Level;Squat;Stairs;Stand;Sleep;Carry;Dressing;Bed Mobility    Examination-Participation Restrictions Community Activity;Interpersonal Relationship;Cleaning    Stability/Clinical Decision Making Evolving/Moderate complexity    Rehab Potential Good    PT Frequency 1x / week    PT Duration 12 weeks    PT Treatment/Interventions Biofeedback;Cryotherapy;Electrical Stimulation;Moist Heat;Ultrasound;Neuromuscular re-education;Therapeutic exercise;Therapeutic activities;Patient/family education;Manual techniques;Dry needling;Taping;Spinal Manipulations    PT Next Visit Plan work on engaging the abdominals, continue with stretching, work on right hip extension to elongate the psoas    PT Home Exercise Plan Access Code: [email or web address]    Consulted and Agree with Plan of Care Patient           Patient will benefit from skilled therapeutic intervention in order to improve the following deficits and impairments:  Decreased range of motion, Increased fascial restricitons, Decreased endurance, Increased muscle spasms, Pain, Decreased strength, Decreased mobility  Visit Diagnosis: Muscle weakness (generalized)  Cramp and spasm  Generalized abdominal pain     Problem List Patient Active Problem List   Diagnosis Date Noted  . Moderate persistent asthma with acute exacerbation 08/01/2020  . Uterine fibroid 01/23/2016  . Right  groin pain 01/23/2016  . History of C-section 01/23/2016  . Migraines   . Anxiety   . Depression     Earlie Counts, PT 09/10/20 12:32 PM   [facility] [address], [place], [place], [address] Phone: [phone]   Fax:  [phone]  Name: Penny [name] MRN: [id number] Date of Birth: 1980-12-26

## 2020-09-16 LAB — ALLERGEN PROFILE, MOLD
Alternaria Alternata IgE: 0.11 kU/L — AB
Aspergillus Fumigatus IgE: <0.1 kU/L
Aureobasidi Pullulans IgE: 0.1 kU/L — AB
Candida Albicans IgE: 8.15 kU/L — AB
Cladosporium Herbarum IgE: 0.11 kU/L — AB
M009-IgE Fusarium proliferatum: <0.1 kU/L
M014-IgE Epicoccum purpur: 0.12 kU/L — AB
Mucor Racemosus IgE: 0.1 kU/L — AB
Penicillium Chrysogen IgE: <0.1 kU/L
Phoma Betae IgE: <0.1 kU/L
Setomelanomma Rostrat: <0.1 kU/L
Stemphylium Herbarum IgE: <0.1 kU/L

## 2020-09-16 LAB — ASPERGILLUS PRECIPITINS
A.Fumigatus #1 Abs: NEGATIVE
Aspergillus Flavus Antibodies: NEGATIVE
Aspergillus Niger Antibodies: NEGATIVE
Aspergillus glaucus IgG: NEGATIVE
Aspergillus nidulans IgG: NEGATIVE
Aspergillus terreus IgG: NEGATIVE

## 2020-09-16 LAB — CBC WITH DIFFERENTIAL/PLATELET
Basophils Absolute: 0.1 10*3/uL (ref 0.0–0.2)
Basos: 0 %
EOS (ABSOLUTE): 0.3 10*3/uL (ref 0.0–0.4)
Eos: 2 %
Hematocrit: 43.3 % (ref 34.0–46.6)
Hemoglobin: 14 g/dL (ref 11.1–15.9)
Immature Grans (Abs): 0.1 10*3/uL (ref 0.0–0.1)
Immature Granulocytes: 1 %
Lymphocytes Absolute: 1.7 10*3/uL (ref 0.7–3.1)
Lymphs: 14 %
MCH: 27.2 pg (ref 26.6–33.0)
MCHC: 32.3 g/dL (ref 31.5–35.7)
MCV: 84 fL (ref 79–97)
Monocytes Absolute: 0.5 10*3/uL (ref 0.1–0.9)
Monocytes: 4 %
Neutrophils Absolute: 9.5 10*3/uL — ABNORMAL HIGH (ref 1.4–7.0)
Neutrophils: 79 %
Platelets: 322 10*3/uL (ref 150–450)
RBC: 5.14 x10E6/uL (ref 3.77–5.28)
RDW: 15.2 % (ref 11.7–15.4)
WBC: 12.1 10*3/uL — ABNORMAL HIGH (ref 3.4–10.8)

## 2020-09-16 LAB — ANCA TITERS
Atypical pANCA: <1:20 {titer}
C-ANCA: <1:20 {titer}
P-ANCA: <1:20 {titer}

## 2020-09-16 LAB — ALPHA-1-ANTITRYPSIN: A-1 Antitrypsin: 146 mg/dL (ref 100–188)

## 2020-09-16 LAB — IGE: IgE (Immunoglobulin E), Serum: 1338 IU/mL — ABNORMAL HIGH (ref 6–495)

## 2020-09-18 ENCOUNTER — Encounter: Payer: Self-pay | Admitting: Physical Therapy

## 2020-09-18 ENCOUNTER — Ambulatory Visit
Payer: Medicaid Other | Attending: Student in an Organized Health Care Education/Training Program | Admitting: Physical Therapy

## 2020-09-18 ENCOUNTER — Other Ambulatory Visit: Payer: Self-pay

## 2020-09-18 DIAGNOSIS — R1084 Generalized abdominal pain: Secondary | ICD-10-CM | POA: Diagnosis present

## 2020-09-18 DIAGNOSIS — R252 Cramp and spasm: Secondary | ICD-10-CM | POA: Diagnosis present

## 2020-09-18 DIAGNOSIS — M6281 Muscle weakness (generalized): Secondary | ICD-10-CM

## 2020-09-18 NOTE — Therapy (Signed)
[facility] [address] [place], [place], [address] Phone: [phone]   Fax:  [phone]  Physical Therapy Treatment  Patient Details  Name: Penny [name] MRN: [id number] Date of Birth: January 16, 1981 Referring Provider (PT): Dr. Tommie Raymond   Encounter Date: 09/18/2020   PT End of Session - 09/18/20 0932    Visit Number 6    Date for PT Re-Evaluation 10/05/20    Authorization Type medicaid    Authorization Time Period 08/19/2020-11/10/20    Authorization - Visit Number 4    Authorization - Number of Visits 12    PT Start Time 0930    PT Stop Time 1010    PT Time Calculation (min) 40 min    Activity Tolerance Patient tolerated treatment well;No increased pain    Behavior During Therapy WFL for tasks assessed/performed           Past Medical History:  Diagnosis Date  . ADHD (attention deficit hyperactivity disorder)   . Anxiety    no meds  . Asthma   . Chronic back pain   . Depression    no meds  . Essential hypertension    a. Dx 06/2016.  . Migraines    last one over 1 month ago  . Overweight   . Palpitations - Sinus Tachycardia    a. 12/2016 Echo: E f55-60%, no rwma, nl LA/RA sizes.  . Placenta previa    w/ 2 pregnancies.  . Preterm labor    c/s at 36 wks  . Scoliosis   . Sinus tachycardia     Past Surgical History:  Procedure Laterality Date  . CESAREAN SECTION  2010   x 1 at 36 wks in [place]  . CESAREAN SECTION  February 13, 2012  . CHROMOPERTUBATION  10/26/2015   Procedure: ATTEMPTED CHROMOPERTUBATION;  Surgeon: Crawford Givens, MD;  Location: [facility];  Service: Gynecology;;  . LAPAROSCOPIC LYSIS OF ADHESIONS  10/26/2015   Procedure: LAPAROSCOPIC LYSIS OF ADHESIONS;  Surgeon: Crawford Givens, MD;  Location: [facility];  Service: Gynecology;;  . LAPAROSCOPY N/A 10/26/2015   Procedure: LAPAROSCOPY OPERATIVE WITH REMOVAL OF MISPLACED FILSHE CLIP;  Surgeon: Crawford Givens, MD;  Location: [facility];   Service: Gynecology;  Laterality: N/A;  . SVD  2003   x 1 in [place]  . TUBAL LIGATION    . WISDOM TOOTH EXTRACTION      There were no vitals filed for this visit.   Subjective Assessment - 09/18/20 0935    Subjective I just feel a little pain in the right groin.    Patient Stated Goals reduce pain    Currently in Pain? No/denies              [facility] PT Assessment - 09/18/20 0001      Assessment   Medical Diagnosis R10.2 Pelvic pain in female    Referring Provider (PT) Dr. Tommie Raymond    Onset Date/Surgical Date 02/13/12    Hand Dominance Right    Prior Therapy none      Precautions   Precautions None      Cognition   Overall Cognitive Status Within Functional Limits for tasks assessed      Posture/Postural Control   Posture/Postural Control Postural limitations    Posture Comments scoliosis                         [facility] Adult PT Treatment/Exercise - 09/18/20 0001  Lumbar Exercises: Aerobic   Stationary Bike 6.5 minutes level 1 as assessing patient; had increased in right lower quadrant pain      Lumbar Exercises: Supine   Clam 15 reps;1 second    Clam Limitations each leg keeping the abdominal braced    Heel Slides 15 reps    Heel Slides Limitations each side with abdominal bracing to elongate the psoas    Bridge 15 reps    Bridge Limitations VC to contract the gluteals      Lumbar Exercises: Sidelying   Other Sidelying Lumbar Exercises iliacus pull back 20 times each side with foam roll between knees      Lumbar Exercises: Prone   Straight Leg Raise 15 reps;1 second    Straight Leg Raises Limitations on each leg, not lifting pelvis    Other Prone Lumbar Exercises lay on stomach with knees bent and bring feet togerther and apart to work on hip rotators                  PT Education - 09/18/20 1009    Education Details Access Code: [email or web address]    Person(s) Educated Patient    Methods Explanation;Demonstration;Verbal cues;Handout     Comprehension Returned demonstration;Verbalized understanding            PT Short Term Goals - 08/19/20 1226      PT SHORT TERM GOAL #1   Title pt to be I with intial HEP     Time 4    Period Weeks    Status Achieved             PT Long Term Goals - 09/18/20 0936      PT LONG TERM GOAL #1   Title independent with advanced HEP    Baseline educated with different exercises as she is advanced    Time 12    Status On-going      PT LONG TERM GOAL #2   Title understand ways to manage pain with meditation, breathing, and conservation of energy    Baseline continues to be educated    Time 12    Period Weeks    Status Achieved      PT LONG TERM GOAL #3   Title able to brace her abdomen during daily tasks to reduce the pain and improve her function    Baseline has not learned yet due to reducing pain    Time 12    Period Weeks    Status On-going      PT LONG TERM GOAL #4   Title able to start a walking program for 15 mintues a day due to pain decrease </= 5/10    Baseline back pain decreased by 40% .    Time 12    Period Weeks    Status Deferred   unable to do due to her asthma that is not under control     PT LONG TERM GOAL #5   Title right hip strength >/= 4/5 so she is able to tolerate her daily tasks with pain decreased </= 5/10    Baseline pain level 5/10; right hip strength is 5/5    Time 12    Period Weeks    Status Partially Met                 Plan - 09/18/20 0937    Clinical Impression Statement Patient is having less pain in her right hip. Patient is not able to do a walking  program due to her asthma not under control. Patient is working on hip extension strength and elongation of the psoas. Patient is able to exercise without increased pain. Patient will benefit from skilled therapy to understand ways to manage her pain and improve overall strength and mobility.    Personal Factors and Comorbidities Comorbidity 3+;Age;Fitness;Sex     Comorbidities 2x cesaren section; exploratory laparotomy    Examination-Activity Limitations Toileting;Locomotion Level;Squat;Stairs;Stand;Sleep;Carry;Dressing;Bed Mobility    Examination-Participation Restrictions Community Activity;Interpersonal Relationship;Cleaning    Stability/Clinical Decision Making Evolving/Moderate complexity    Rehab Potential Good    PT Frequency 1x / week    PT Duration 12 weeks    PT Treatment/Interventions Biofeedback;Cryotherapy;Electrical Stimulation;Moist Heat;Ultrasound;Neuromuscular re-education;Therapeutic exercise;Therapeutic activities;Patient/family education;Manual techniques;Dry needling;Taping;Spinal Manipulations    PT Next Visit Plan work on engaging the abdominals in standing and sitting,  continue with stretching, work on right hip extension to elongate the psoas    PT Home Exercise Plan Access Code: [email or web address]    Consulted and Agree with Plan of Care Patient           Patient will benefit from skilled therapeutic intervention in order to improve the following deficits and impairments:  Decreased range of motion, Increased fascial restricitons, Decreased endurance, Increased muscle spasms, Pain, Decreased strength, Decreased mobility  Visit Diagnosis: Muscle weakness (generalized)  Cramp and spasm  Generalized abdominal pain     Problem List Patient Active Problem List   Diagnosis Date Noted  . Moderate persistent asthma with acute exacerbation 08/01/2020  . Uterine fibroid 01/23/2016  . Right groin pain 01/23/2016  . History of C-section 01/23/2016  . Migraines   . Anxiety   . Depression     Earlie Counts, PT 09/18/20 10:11 AM   [facility] [address], [place], [place], [address] Phone: [phone]   Fax:  [phone]  Name: Penny [name] MRN: [id number] Date of Birth: [birth year]

## 2020-09-18 NOTE — Patient Instructions (Signed)
Access Code: [email or web address] URL: [email or web address] Date: 09/18/2020 Prepared by: Earlie Counts  Exercises  Supine Bridge - 1 x daily - 7 x weekly - 10 reps Supine Transversus Abdominis Bracing with Heel Slide - 1 x daily - 7 x weekly - 2 sets - 10 reps Prone Hip Extension - Two Pillows - 1 x daily - 7 x weekly - 2 sets - 10 reps [facility] [address] [place], [place] [address] Phone # [phone] Fax [phone]

## 2020-09-20 ENCOUNTER — Ambulatory Visit (HOSPITAL_COMMUNITY)
Admission: EM | Admit: 2020-09-20 | Discharge: 2020-09-20 | Disposition: A | Discharge status: Home / Self Care | Payer: Medicaid Other | Attending: Licensed Clinical Social Worker | Admitting: Licensed Clinical Social Worker

## 2020-09-20 ENCOUNTER — Other Ambulatory Visit: Payer: Self-pay

## 2020-09-20 ENCOUNTER — Encounter (HOSPITAL_COMMUNITY): Payer: Self-pay | Admitting: Emergency Medicine

## 2020-09-20 DIAGNOSIS — F329 Major depressive disorder, single episode, unspecified: Secondary | ICD-10-CM | POA: Insufficient documentation

## 2020-09-20 DIAGNOSIS — F322 Major depressive disorder, single episode, severe without psychotic features: Secondary | ICD-10-CM

## 2020-09-20 NOTE — Discharge Instructions (Signed)
With outpatient resources for a therapist.
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
## 2020-09-20 NOTE — ED Notes (Signed)
Patient belongings are stored in locker number [phone]

## 2020-09-20 NOTE — ED Provider Notes (Signed)
Behavioral Health Urgent Care Medical Screening Exam  Patient Name: Penny [name] MRN: [id number] Date of Evaluation: 09/20/20 Chief Complaint:   Diagnosis: Current severe episode of major depressive disorder without psychotic features, unspecified whether recurrent ([place])  History of Present illness: Penny [name] is a 39 y.o. female patient presented to [facility] voluntarily by law enforcement due to the patient's life at home and recent new medical diagnoses. The patient was very emotional during her assessment. The patient denies wanting to hurt herself or anyone else. The patient voiced her life situation and her having to deal with her husband and her three children. The patient currently is not seeing a therapist. She acknowledges that she does need to meet with a therapist.   She voiced that she is compliant with all of her medications; she attends all of her doctor's appointments.  The patient does not appear to be responding to internal or external stimuli. Neither is the patient presenting with any delusional thinking. The patient denies auditory or visual hallucinations. The patient denies any suicidal, homicidal, or self-harm ideations. The patient is not presenting with any psychotic or paranoid behaviors. During an encounter with the patient, she was able to answer questions appropriately. The patient is casually dressed, alert, and oriented x4. The patient speaks in a clear tone, at moderate volume, and at an average pace. Motor behavior appears normal. Eye contact is good. The patient's mood is depressed, and her affect is congruent with mood. The thought process is coherent and relevant. There is no indication the patient is currently responding to internal stimuli or experiencing delusional thought content. The patient was cooperative throughout the assessment.  Psychiatric Specialty Exam  Presentation  General Appearance:Appropriate for Environment  Eye  Contact:Good  Speech:Clear and Coherent  Speech Volume:Normal  Handedness:No data recorded  Mood and Affect  Mood:Anxious;Depressed  Affect:Tearful;Depressed;Congruent   Thought Process  Thought Processes:Coherent  Descriptions of Associations:Intact  Orientation:Full (Time, Place and Person)  Thought Content:Logical  Hallucinations:None  Ideas of Reference:None  Suicidal Thoughts:No  Homicidal Thoughts:No   Sensorium  Memory:Immediate Good;Recent Good;Remote Good  Judgment:Good  Insight:Fair   Executive Functions  Concentration:Good  Attention Span:Good  [place] of Knowledge:Good  Language:Good   Psychomotor Activity  Psychomotor Activity:Normal   Assets  Assets:Communication Skills;Physical Health;Resilience;Social Support;Financial Resources/Insurance   Sleep  Sleep:Poor  Number of hours: 3   Physical Exam: Physical Exam Vitals and nursing note reviewed.  Constitutional:      Appearance: Normal appearance. She is obese.  HENT:     Nose: Nose normal.     Mouth/Throat:     Mouth: Mucous membranes are moist.  Cardiovascular:     Rate and Rhythm: Tachycardia present.  Pulmonary:     Effort: Pulmonary effort is normal.  Musculoskeletal:        General: Normal range of motion.     Cervical back: Normal range of motion and neck supple.  Neurological:     General: No focal deficit present.     Mental Status: She is alert and oriented to person, place, and time. Mental status is at baseline.  Psychiatric:        Thought Content: Thought content normal.        Judgment: Judgment normal.    ROS Blood pressure (!) 128/93, pulse (!) 111, temperature 97.7 F (36.5 C), temperature source Tympanic, resp. rate 18, height 5' 4.17" (1.63 m), weight 202 lb (91.6 kg), SpO2 98 %. Body mass index is 34.49 kg/m.  Musculoskeletal:  Strength & Muscle Tone: within normal limits Gait & Station: normal Patient leans: N/A   [place] MSE  Discharge Disposition for Follow up and Recommendations: Based on my evaluation the patient does not appear to have an emergency medical condition and can be discharged with resources and follow up care in outpatient services for Medication Management and Individual Therapy   Caroline Sauger, NP 09/20/2020, 2:08 AM

## 2020-09-20 NOTE — ED Triage Notes (Signed)
Presents with stress, feeling like she is about to have a nervous breakdown.  Denies SI, HI and AVH.

## 2020-09-21 ENCOUNTER — Telehealth: Payer: Self-pay | Admitting: *Deleted

## 2020-09-21 NOTE — Telephone Encounter (Signed)
Patient called today stating that she is very concerned about the side effects of Xolair. She states that she has been discussing the side effects with her family members and they have been advising her not to do the Xolair injections. She is very concerned about the heart attack side effect given that she has a heart condition along with the many other listed side effects. I advised that I will cancel her appt for her to start Xolair since it was before her appointment with you and that she can come in and discuss with you the best course of action. Patient verbalized understanding.

## 2020-09-21 NOTE — Telephone Encounter (Signed)
Fine.  Penny Marvel, MD [facility] [place]

## 2020-09-22 ENCOUNTER — Ambulatory Visit (INDEPENDENT_AMBULATORY_CARE_PROVIDER_SITE_OTHER): Payer: Medicaid Other | Admitting: Allergy & Immunology

## 2020-09-22 ENCOUNTER — Ambulatory Visit: Payer: Self-pay

## 2020-09-22 ENCOUNTER — Encounter: Payer: Self-pay | Admitting: Allergy & Immunology

## 2020-09-22 ENCOUNTER — Other Ambulatory Visit: Payer: Self-pay

## 2020-09-22 ENCOUNTER — Telehealth: Payer: Self-pay

## 2020-09-22 VITALS — BP 98/70 | HR 74 | Resp 19 | Ht 63.2 in | Wt 198.4 lb

## 2020-09-22 DIAGNOSIS — Z7185 Encounter for immunization safety counseling: Secondary | ICD-10-CM | POA: Diagnosis not present

## 2020-09-22 DIAGNOSIS — J302 Other seasonal allergic rhinitis: Secondary | ICD-10-CM

## 2020-09-22 DIAGNOSIS — J3089 Other allergic rhinitis: Secondary | ICD-10-CM | POA: Diagnosis not present

## 2020-09-22 DIAGNOSIS — J4541 Moderate persistent asthma with (acute) exacerbation: Secondary | ICD-10-CM

## 2020-09-22 DIAGNOSIS — J4540 Moderate persistent asthma, uncomplicated: Secondary | ICD-10-CM | POA: Diagnosis not present

## 2020-09-22 DIAGNOSIS — F17200 Nicotine dependence, unspecified, uncomplicated: Secondary | ICD-10-CM

## 2020-09-22 MED ORDER — OMALIZUMAB 150 MG ~~LOC~~ SOLR
375.0000 mg | SUBCUTANEOUS | Status: AC
  Administered 2020-09-22 – 2021-07-05 (×20): 375 mg via SUBCUTANEOUS

## 2020-09-22 MED ORDER — OMALIZUMAB 150 MG ~~LOC~~ SOLR: 300.0000 mg | Freq: Once | SUBCUTANEOUS | Status: DC

## 2020-09-22 NOTE — Patient Instructions (Addendum)
1. Moderate persistent asthma, uncomplicated  - Spirometry (lung testing) looked stable. - Xolair dose given today.  - Using the Xopenex every day can make your lungs LESS responsive to the medication. - Therefore, we are adding medications to help with decreasing the need for the Xopenex. - The Xolair acts as a SPONGE for IgE (allergy) antibodies to prevent them from binding to the lung cells and other immune cells and causing asthma attacks. - Our goal is to IMPROVE asthma control and DECREASE your need for systemic steroids.  - Daily controller medication(s): Singulair 10mg  daily and Symbicort 160/4.14mcg two puffs twice daily with spacer - Prior to physical activity: albuterol 2 puffs 10-15 minutes before physical activity. - Rescue medications: albuterol 4 puffs every 4-6 hours as needed - Asthma control goals:  * Full participation in all desired activities (may need albuterol before activity) * Albuterol use two time or less a week on average (not counting use with activity) * Cough interfering with sleep two time or less a month * Oral steroids no more than once a year * No hospitalizations  2. Chronic rhinitis (grasses, weeds, ragweed, trees, outdoor molds, dust mites, cat, dog, roach) - Continue with the Singulair 10mg  daily. - Continue with Xyzal 5mg  daily.  3. Current smoker - Restart your nicotine patches for now, but talk to your PCP about this going forward.  - Use one patch daily.  4. Food intolerance - We are going to get lab testing to look for shellfish allergy. - EpiPen is up to date.   5. Return in about 3 months (around 12/23/2020).    Please inform us of any Emergency Department visits, hospitalizations, or changes in symptoms. Call us before going to the ED for breathing or allergy symptoms since we might be able to fit you in for a sick visit. Feel free to contact us anytime with any questions, problems, or concerns.  It was a pleasure to see you and your  family again today!  Websites that have reliable patient information: 1. American Academy of Asthma, Allergy, and Immunology: www.aaaai.org 2. Food Allergy Research and Education (FARE): foodallergy.org 3. Mothers of Asthmatics: http://www.asthmacommunitynetwork.org 4. American College of Allergy, Asthma, and Immunology: www.acaai.org   COVID-19 Vaccine Information can be found at: ShippingScam.co.uk For questions related to vaccine distribution or appointments, please email vaccine@[place] .com or call ([phone] [phone].     "Like" [place] on Facebook and Instagram for our latest updates!     HAPPY FALL!     Make sure you are registered to vote! If you have moved or changed any of your contact information, you will need to get this updated before voting!  In some cases, you MAY be able to register to vote online: [email or web address]

## 2020-09-22 NOTE — Progress Notes (Signed)
Immunotherapy   Patient Details  Name: Penny [name] MRN: [id number] Date of Birth: 02-04-1981  09/22/2020  [name] started injections for  Xolair 375 mg given in the office today. Patient tolerated injection well and waited her 30 mins in the room.  Frequency: 2 weels Epi-Pen:Epi-Pen Available  Consent signed and patient instructions given.   Festus Holts [name] 09/22/2020, 6:04 PM

## 2020-09-22 NOTE — Telephone Encounter (Signed)
Patient started injections on Xolair 375 mg for her Moderate persistent asthma given every 14 days.

## 2020-09-22 NOTE — Progress Notes (Signed)
FOLLOW UP  Date of Service/Encounter:  09/22/20   Assessment:   Moderate persistent asthma - complicated by smoking and anxiety, with a total IgE of 1164 and an absolute eosinophil count of 300  Perennial and seasonal allergic rhinitis (grasses, weeds, ragweed, trees, outdoor molds, dust mites, cat, dog, roach)  Current smoker  Food intolerance  Vaccine hesitancy - discussed the need to obtain the COVID19 vaccine  Plan/Recommendations:   1. Moderate persistent asthma, uncomplicated  - Spirometry (lung testing) looked stable. - Xolair dose given today.  - Using the Xopenex every day can make your lungs LESS responsive to the medication. - Therefore, we are adding medications to help with decreasing the need for the Xopenex. - The Xolair acts as a SPONGE for IgE (allergy) antibodies to prevent them from binding to the lung cells and other immune cells and causing asthma attacks. - Our goal is to IMPROVE asthma control and DECREASE your need for systemic steroids.  - Daily controller medication(s): Singulair 10mg  daily and Symbicort 160/4.68mcg two puffs twice daily with spacer - Prior to physical activity: albuterol 2 puffs 10-15 minutes before physical activity. - Rescue medications: albuterol 4 puffs every 4-6 hours as needed - Asthma control goals:  * Full participation in all desired activities (may need albuterol before activity) * Albuterol use two time or less a week on average (not counting use with activity) * Cough interfering with sleep two time or less a month * Oral steroids no more than once a year * No hospitalizations  2. Chronic rhinitis (grasses, weeds, ragweed, trees, outdoor molds, dust mites, cat, dog, roach) - Continue with the Singulair 10mg  daily. - Continue with Xyzal 5mg  daily.  3. Current smoker - Restart your nicotine patches for now, but talk to your PCP about this going forward.  - Use one patch daily.  4. Food intolerance - We are going  to get lab testing to look for shellfish allergy. - EpiPen is up to date.   5. Return in about 3 months (around 12/23/2020).    Subjective:   Penny [name] is a 39 y.o. female presenting today for follow up of  Chief Complaint  Patient presents with  . Cough    Penny [name] has a history of the following: Patient Active Problem List   Diagnosis Date Noted  . Moderate persistent asthma with acute exacerbation 08/01/2020  . Uterine fibroid 01/23/2016  . Right groin pain 01/23/2016  . History of C-section 01/23/2016  . Migraines   . Anxiety   . Depression     History obtained from: chart review and patient.  Penny [name] is a 39 y.o. female presenting for a follow up visit.  I last saw her as a work in in October 2021.  At that time, her spirometry was fairly low but it did improve with albuterol, at least symptomatically.  We did obtain labs to see if she will qualify for one of her injectable asthma medications.  We started her on prednisone and continued Singulair and Symbicort 2 puffs twice daily.  For her rhinitis, we obtained an environmental allergy panel via the blood.  We continued with Singulair as well as Xyzal.  We also started doxycycline.  She was a smoker and we sent in nicotine patches to help her with smoking cessation.  We did obtain labs to update her shellfish allergy.  In the interim, her labs came back positive with elevated IgE as well as elevated eosinophils.  We recommended  starting Xolair, but she has not on board due to one of the random side effects that she read.  Since last visit, she has mostly done well.  She did finish her prednisone from last visit.  She felt that this did help.  She remains on the Symbicort 2 puffs twice daily.  She has been thinking a lot about the Xolair.  In fact, she joined some kind of Facebook group of people who have been on Xolair and now is worried about cardiac side effects.  She does realize that a lot of medications have  side effects listed that are necessarily related to causation, but this heart issue is really getting under her skin.  She is very hesitant about doing the Xolair at this point and would like to discuss this more.  She also remains interested in allergen immunotherapy, but for the last time I saw her we are going to get her asthma under better control before we consider starting allergy shots.  We are also hesitant to do allergy shots because several of her children are on allergy shots and has never made it through a blue vial due to noncompliance.  She does have a lot of questions about the COVID-19 vaccine as well.  Evidently, she does not want to get it, but her mother is forcing her to do it.  She evidently had some type of skin cancer and had to have some skin removed on her face.  Her mother is strongly urging her to get it.  Otherwise, there have been no changes to her past medical history, surgical history, family history, or social history.    Review of Systems  Constitutional: Negative.  Negative for chills, fever, malaise/fatigue and weight loss.  HENT: Positive for congestion. Negative for ear discharge, ear pain and sinus pain.   Eyes: Negative for pain, discharge and redness.  Respiratory: Positive for shortness of breath. Negative for cough and sputum production.   Cardiovascular: Negative.  Negative for chest pain and palpitations.  Gastrointestinal: Negative for abdominal pain, constipation, diarrhea, heartburn, nausea and vomiting.  Skin: Negative.  Negative for itching and rash.  Neurological: Negative for dizziness and headaches.  Endo/Heme/Allergies: Positive for environmental allergies. Does not bruise/bleed easily.       Objective:   Blood pressure 98/70, pulse 74, resp. rate 19, height 5' 3.2" (1.605 m), weight 198 lb 6.4 oz (90 kg), SpO2 97 %. Body mass index is 34.92 kg/m.   Physical Exam:  Physical Exam Constitutional:      Appearance: She is  well-developed.     Comments: Very talkative female.  HENT:     Head: Normocephalic and atraumatic.     Right Ear: Tympanic membrane, ear canal and external ear normal.     Left Ear: Tympanic membrane, ear canal and external ear normal.     Nose: No nasal deformity, septal deviation, mucosal edema or rhinorrhea.     Right Turbinates: Enlarged and swollen.     Left Turbinates: Enlarged and swollen.     Right Sinus: No maxillary sinus tenderness or frontal sinus tenderness.     Left Sinus: No maxillary sinus tenderness or frontal sinus tenderness.     Mouth/Throat:     Mouth: Mucous membranes are not pale and not dry.     Pharynx: Uvula midline.  Eyes:     General:        Right eye: No discharge.        Left eye: No discharge.  Conjunctiva/sclera: Conjunctivae normal.     Right eye: Right conjunctiva is not injected. No chemosis.    Left eye: Left conjunctiva is not injected. No chemosis.    Pupils: Pupils are equal, round, and reactive to light.  Cardiovascular:     Rate and Rhythm: Normal rate and regular rhythm.     Heart sounds: Normal heart sounds.  Pulmonary:     Effort: Pulmonary effort is normal. No tachypnea, accessory muscle usage or respiratory distress.     Breath sounds: Normal breath sounds. No wheezing, rhonchi or rales.     Comments: Coarse upper airway sounds throughout. Chest:     Chest wall: No tenderness.  Lymphadenopathy:     Cervical: No cervical adenopathy.  Skin:    Coloration: Skin is not pale.     Findings: No abrasion, erythema, petechiae or rash. Rash is not papular, urticarial or vesicular.     Comments: No eczematous or urticarial lesions noted.  Neurological:     Mental Status: She is alert.  Psychiatric:        Behavior: Behavior is cooperative.      Diagnostic studies:    Spirometry: results abnormal (FEV1: 1.77/69%, FVC: 1.94/62%, FEV1/FVC: 91%).    Spirometry consistent with possible restrictive disease.   Allergy Studies:  none  Patient received her Xolair dose and tolerated this without adverse event.    Salvatore Marvel, MD  [facility] [place]

## 2020-09-23 ENCOUNTER — Telehealth: Payer: Self-pay | Admitting: *Deleted

## 2020-09-23 ENCOUNTER — Encounter: Payer: Self-pay | Admitting: Allergy & Immunology

## 2020-09-23 NOTE — Telephone Encounter (Signed)
Follow up call to patient.  Patient called this morning and stated she had a headache last night after she took a nap when she got home from Xolair injection.  Her headache did subside and she is feeling better.  Patient also complains of some itching today.  She has joined an Lawyer group and states others have stated they have had itching.  Explained to patient that  Xolair does help itching and hives along with asthma.  Informed patient she could apply hydrocortisone cream to the areas that were itching and she can take an antihistamine.  Patient stated she still planned to keep her next [place] appointment, but was concerned about side effects or if itching was an issue.  Will update Dr. Ernst Bowler and check on patient later. Left voicemail message and will call patient for follow up tomorrow.

## 2020-09-24 NOTE — Telephone Encounter (Signed)
Spoke with patient.  Per patient, she does not have any hives or redness.  She does still have some itching.  She did take Benadryl yesterday and that helped the itching.  Patient asked if she could double up her Montelukast and informed her she could only take one a day as she was on the maximum dose at 10 mg.  Patient will increase antihistamine to twice a day if needed and  states Xyzal does not make her sleepy.  Discussed her anxiety related to Xolair and discussed environmental allergies.  Patient wants to know when she can start allergy injections. Per Dr. Ernst Bowler, allergy injections will be discussed after she had a couple more of her Xolair injections without any issues.

## 2020-09-24 NOTE — Telephone Encounter (Signed)
I now.  She talked to me about this group yesterday when she brought up all these random "side effects" of Xolair.  I explained to her that side effects are not necessarily related to the Xolair, but have to be reported if the patient is reported in trials.  It could be a symptom completely unrelated to Xolair and mechanistically impossible to happen with Xolair.  I think this is just feeling into her underlying anxiety and does not help with her ability to tolerate the injections at all.  Salvatore Marvel, MD [facility]

## 2020-09-25 ENCOUNTER — Other Ambulatory Visit: Payer: Self-pay

## 2020-09-25 ENCOUNTER — Ambulatory Visit: Payer: Medicaid Other | Admitting: Physical Therapy

## 2020-09-25 ENCOUNTER — Encounter: Payer: Self-pay | Admitting: Physical Therapy

## 2020-09-25 DIAGNOSIS — R252 Cramp and spasm: Secondary | ICD-10-CM

## 2020-09-25 DIAGNOSIS — R1084 Generalized abdominal pain: Secondary | ICD-10-CM

## 2020-09-25 DIAGNOSIS — M6281 Muscle weakness (generalized): Secondary | ICD-10-CM | POA: Diagnosis not present

## 2020-09-25 MED ORDER — PREDNISOLONE ACETATE 1 % OP SUSP: 1.0000 [drp] | Freq: Three times a day (TID) | OPHTHALMIC | 0 refills | Status: AC

## 2020-09-25 MED ORDER — OLOPATADINE HCL 0.2 % OP SOLN: 1.0000 [drp] | Freq: Every day | OPHTHALMIC | 5 refills | Status: DC | PRN

## 2020-09-25 NOTE — Telephone Encounter (Signed)
Patient called today and states her eyes are very itchy and nothing seems to be helping. She states she has not developed any hives or redness. She does not want to stop the Xolair but wants to know what she can do to stop the itchy eyes and if this could be a side effect of the xoliar as well. Please advice

## 2020-09-25 NOTE — Telephone Encounter (Signed)
It is likely a side effect of her environmental allergies.  Can we send in an eyedrop for her such as Pazeo or Pataday?  We can also send in Pred forte eyedrops 3 times daily for 7 days.  I do not think this has anything to do with her Xolair.  Salvatore Marvel, MD [facility] [place] [place]

## 2020-09-25 NOTE — Therapy (Signed)
[facility] [address] [place], [place], [address] Phone: [phone]   Fax:  [phone]  Physical Therapy Treatment  Patient Details  Name: Penny [name] MRN: [id number] Date of Birth: 10/16/1981 Referring Provider (PT): Dr. Tommie Raymond   Encounter Date: 09/25/2020   PT End of Session - 09/25/20 1116    Visit Number 7    Date for PT Re-Evaluation 10/05/20    Authorization Type medicaid    Authorization Time Period 08/19/2020-11/10/20    Authorization - Visit Number 5    Authorization - Number of Visits 12    PT Start Time 1100    PT Stop Time 0973    PT Time Calculation (min) 38 min    Activity Tolerance Patient tolerated treatment well;No increased pain    Behavior During Therapy WFL for tasks assessed/performed           Past Medical History:  Diagnosis Date  . ADHD (attention deficit hyperactivity disorder)   . Anxiety    no meds  . Asthma   . Chronic back pain   . Depression    no meds  . Essential hypertension    a. Dx 06/2016.  . Migraines    last one over 1 month ago  . Overweight   . Palpitations - Sinus Tachycardia    a. 12/2016 Echo: E f55-60%, no rwma, nl LA/RA sizes.  . Placenta previa    w/ 2 pregnancies.  . Pre-diabetes   . Preterm labor    c/s at 36 wks  . Scoliosis   . Sinus tachycardia     Past Surgical History:  Procedure Laterality Date  . CESAREAN SECTION  2010   x 1 at 36 wks in [place]  . CESAREAN SECTION  February 13, 2012  . CHROMOPERTUBATION  10/26/2015   Procedure: ATTEMPTED CHROMOPERTUBATION;  Surgeon: Crawford Givens, MD;  Location: [facility];  Service: Gynecology;;  . LAPAROSCOPIC LYSIS OF ADHESIONS  10/26/2015   Procedure: LAPAROSCOPIC LYSIS OF ADHESIONS;  Surgeon: Crawford Givens, MD;  Location: [facility];  Service: Gynecology;;  . LAPAROSCOPY N/A 10/26/2015   Procedure: LAPAROSCOPY OPERATIVE WITH REMOVAL OF MISPLACED FILSHE CLIP;  Surgeon: Crawford Givens, MD;   Location: [facility];  Service: Gynecology;  Laterality: N/A;  . SVD  2003   x 1 in [place]  . TUBAL LIGATION    . WISDOM TOOTH EXTRACTION      There were no vitals filed for this visit.   Subjective Assessment - 09/25/20 1114    Subjective I have had more pain since I have been taking the Zolor.    Patient Stated Goals reduce pain    Currently in Pain? Yes    Pain Score 6     Pain Location Abdomen    Pain Orientation Right    Pain Descriptors / Indicators Aching;Dull    Pain Type Chronic pain    Pain Onset More than a month ago    Pain Frequency Intermittent    Aggravating Factors  recumbent bike, when moving the right leg    Pain Relieving Factors massage it out    Multiple Pain Sites No              [facility] PT Assessment - 09/25/20 0001      Assessment   Medical Diagnosis R10.2 Pelvic pain in female    Referring Provider (PT) Dr. Tommie Raymond    Onset Date/Surgical Date 02/13/12    Hand Dominance  Right    Prior Therapy none      Precautions   Precautions None      Cognition   Overall Cognitive Status Within Functional Limits for tasks assessed      Posture/Postural Control   Posture/Postural Control Postural limitations    Posture Comments scoliosis      AROM   Lumbar Flexion full    Lumbar Extension full    Lumbar - Right Side Bend full                         [facility] Adult PT Treatment/Exercise - 09/25/20 0001      Lumbar Exercises: Aerobic   Stationary Bike 6.5 minutes level 1 as assessing patient; had increased in right lower quadrant pain      Lumbar Exercises: Supine   Clam 15 reps;1 second    Clam Limitations each leg keeping the abdominal braced    Heel Slides 15 reps    Heel Slides Limitations each side with abdominal bracing to elongate the psoas    Bridge 15 reps    Bridge Limitations VC to contract the gluteals      Lumbar Exercises: Prone   Straight Leg Raise 15 reps;1 second    Other Prone Lumbar Exercises lay on stomach  with knees bent and bring feet togerther and apart to work on hip rotators      Manual Therapy   Manual Therapy Soft tissue mobilization    Manual therapy comments educated patient on how to self massage the right lower quadrant to work on trigger points and she returned demonstration                    PT Short Term Goals - 08/19/20 1226      PT SHORT TERM GOAL #1   Title pt to be I with intial HEP     Time 4    Period Weeks    Status Achieved             PT Long Term Goals - 09/25/20 1124      PT LONG TERM GOAL #1   Title independent with advanced HEP    Baseline educated with different exercises as she is advanced    Time 12    Period Weeks    Status On-going      PT LONG TERM GOAL #2   Title understand ways to manage pain with meditation, breathing, and conservation of energy    Baseline continues to be educated    Time 12    Period Weeks    Status Achieved      PT LONG TERM GOAL #3   Title able to brace her abdomen during daily tasks to reduce the pain and improve her function    Baseline has not learned yet due to reducing pain    Time 12    Period Weeks    Status On-going      PT LONG TERM GOAL #4   Title able to start a walking program for 15 mintues a day due to pain decrease </= 5/10    Baseline back pain decreased by 40% .    Time 12    Period Weeks    Status Deferred      PT LONG TERM GOAL #5   Title right hip strength >/= 4/5 so she is able to tolerate her daily tasks with pain decreased </= 5/10    Baseline  pain level 5/10; right hip strength is 5/5    Time 12    Period Weeks    Status Partially Met                 Plan - 09/25/20 1123    Clinical Impression Statement Patient is having increased body pain due to the medication she is taking for her asthma. Patient has learned a technique to reduce trigger points in the right lower quadrant in prone. Patient has increased pain with riding the stationary bike. Patient is able  to engage the abdominals to work her core. Patient will benefit from skilled therapy to understand ways to manage her pain and improve overall strength and mobility.    Personal Factors and Comorbidities Comorbidity 3+;Age;Fitness;Sex    Comorbidities 2x cesaren section; exploratory laparotomy    Examination-Activity Limitations Toileting;Locomotion Level;Squat;Stairs;Stand;Sleep;Carry;Dressing;Bed Mobility    Examination-Participation Restrictions Community Activity;Interpersonal Relationship;Cleaning    Stability/Clinical Decision Making Evolving/Moderate complexity    Rehab Potential Good    PT Frequency 1x / week    PT Duration 12 weeks    PT Treatment/Interventions Biofeedback;Cryotherapy;Electrical Stimulation;Moist Heat;Ultrasound;Neuromuscular re-education;Therapeutic exercise;Therapeutic activities;Patient/family education;Manual techniques;Dry needling;Taping;Spinal Manipulations    PT Next Visit Plan work on engaging the abdominals in standing and sitting,  continue with stretching, work on right hip extension to elongate the psoas    PT Home Exercise Plan Access Code: [email or web address]    Consulted and Agree with Plan of Care Patient           Patient will benefit from skilled therapeutic intervention in order to improve the following deficits and impairments:  Decreased range of motion, Increased fascial restricitons, Decreased endurance, Increased muscle spasms, Pain, Decreased strength, Decreased mobility  Visit Diagnosis: Muscle weakness (generalized)  Cramp and spasm  Generalized abdominal pain     Problem List Patient Active Problem List   Diagnosis Date Noted  . Moderate persistent asthma with acute exacerbation 08/01/2020  . Uterine fibroid 01/23/2016  . Right groin pain 01/23/2016  . History of C-section 01/23/2016  . Migraines   . Anxiety   . Depression     Earlie Counts, PT 09/25/20 11:44 AM   [facility] [address], [place], [place], [address] Phone: [phone]   Fax:  [phone]  Name: Penny [name] MRN: [id number] Date of Birth: 1981-10-30

## 2020-09-25 NOTE — Addendum Note (Signed)
Addended by: Herbie Drape on: 09/25/2020 04:08 PM   Modules accepted: Orders

## 2020-09-25 NOTE — Telephone Encounter (Signed)
Spoke with patient, informed her of Dr. Gillermina Hu recommendation. Patient verbalized understanding. Patient is wonder if and when she can start her allergy injections. Please advise.

## 2020-09-30 ENCOUNTER — Encounter: Payer: Self-pay | Admitting: Physical Therapy

## 2020-09-30 ENCOUNTER — Ambulatory Visit: Payer: Medicaid Other | Admitting: Physical Therapy

## 2020-09-30 ENCOUNTER — Other Ambulatory Visit: Payer: Self-pay

## 2020-09-30 DIAGNOSIS — R1084 Generalized abdominal pain: Secondary | ICD-10-CM

## 2020-09-30 DIAGNOSIS — M6281 Muscle weakness (generalized): Secondary | ICD-10-CM

## 2020-09-30 DIAGNOSIS — R252 Cramp and spasm: Secondary | ICD-10-CM

## 2020-09-30 NOTE — Therapy (Signed)
[facility] [address], [place], [place], [address] Phone: [phone]   Fax:  [phone]  Physical Therapy Treatment  Patient Details  Name: Penny [name] MRN: [id number] Date of Birth: [birth year] Referring Provider (PT): Dr. Tommie Raymond   Encounter Date: 09/30/2020   PT End of Session - 09/30/20 1522    Visit Number 8    Date for PT Re-Evaluation 11/04/20    Authorization Type medicaid    Authorization Time Period 08/19/2020-11/10/20    Authorization - Visit Number 6    Authorization - Number of Visits 12    PT Start Time 1517    PT Stop Time 1524    PT Time Calculation (min) 39 min    Activity Tolerance Patient tolerated treatment well;No increased pain    Behavior During Therapy WFL for tasks assessed/performed           Past Medical History:  Diagnosis Date  . ADHD (attention deficit hyperactivity disorder)   . Anxiety    no meds  . Asthma   . Chronic back pain   . Depression    no meds  . Essential hypertension    a. Dx 06/2016.  . Migraines    last one over 1 month ago  . Overweight   . Palpitations - Sinus Tachycardia    a. 12/2016 Echo: E f55-60%, no rwma, nl LA/RA sizes.  . Placenta previa    w/ 2 pregnancies.  . Pre-diabetes   . Preterm labor    c/s at 36 wks  . Scoliosis   . Sinus tachycardia     Past Surgical History:  Procedure Laterality Date  . CESAREAN SECTION  2010   x 1 at 36 wks in [place]  . CESAREAN SECTION  February 13, 2012  . CHROMOPERTUBATION  10/26/2015   Procedure: ATTEMPTED CHROMOPERTUBATION;  Surgeon: Crawford Givens, MD;  Location: [facility];  Service: Gynecology;;  . LAPAROSCOPIC LYSIS OF ADHESIONS  10/26/2015   Procedure: LAPAROSCOPIC LYSIS OF ADHESIONS;  Surgeon: Crawford Givens, MD;  Location: [facility];  Service: Gynecology;;  . LAPAROSCOPY N/A 10/26/2015   Procedure: LAPAROSCOPY OPERATIVE WITH REMOVAL OF MISPLACED FILSHE CLIP;  Surgeon: Crawford Givens, MD;   Location: [facility];  Service: Gynecology;  Laterality: N/A;  . SVD  2003   [place] 1 [place]  . TUBAL LIGATION    . WISDOM TOOTH EXTRACTION      There were no vitals filed for this visit.   Subjective Assessment - 09/30/20 1450    Subjective I feel like the medication is affecting me. They want me to have the last shot. I am in alot of pain from the shot.    Patient Stated Goals reduce pain    Currently in Pain? Yes    Pain Score 7     Pain Location Abdomen    Pain Orientation Right    Pain Descriptors / Indicators Aching;Dull    Pain Type Chronic pain    Pain Onset More than a month ago    Pain Frequency Intermittent    Aggravating Factors  when moving the right leg    Pain Relieving Factors massage it out              [facility] PT Assessment - 09/30/20 0001      Assessment   Medical Diagnosis R10.2 Pelvic pain in female    Referring Provider (PT) Dr. Tommie Raymond    Onset Date/Surgical Date 02/13/12  Hand Dominance Right    Prior Therapy none      Precautions   Precautions None      Cognition   Overall Cognitive Status Within Functional Limits for tasks assessed      AROM   Lumbar Flexion full    Lumbar Extension full    Lumbar - Right Side Bend full      Strength   Right Hip Flexion 5/5    Right Hip Extension 5/5    Right Hip External Rotation  5/5    Right Hip ABduction 5/5    Right Hip ADduction 5/5                      Pelvic Floor Special Questions - 09/30/20 0001    Number of Pregnancies 3    Number of C-Sections 2    Number of Vaginal Deliveries 1    Currently Sexually Active Yes    Is this Painful Yes    Marinoff Scale pain interrupts completion    Urinary Leakage No    Fecal incontinence No   constipation   Strength weak squeeze, no lift             [facility] Adult PT Treatment/Exercise - 09/30/20 0001      Lumbar Exercises: Stretches   Sports administrator Right;2 reps;60 seconds    Quad Stretch Limitations sidely    Other Lumbar  Stretch Exercise standing hip flexor stretch with right knee on mat and extending the spine; stand with ITB stretch with right arm overhead and pushing the hips to the right    Other Lumbar Stretch Exercise standing hip adductor stretch with lunge 3 times each way  holding 30 sec.       Lumbar Exercises: Aerobic   Nustep 7 min level 1 while assessing patient      Lumbar Exercises: Supine   Bridge 15 reps    Bridge Limitations VC to fully extend the hips    Other Supine Lumbar Exercises gluteal squeeze with leg extension hold for 5 sec, 15 times      Knee/Hip Exercises: Standing   Other Standing Knee Exercises sit to stand with full hip extension and gluteal squeeze; stand on left leg and push right foot into wall to increase contraction of the right gluteal hold 5 sec 10x      Manual Therapy   Manual Therapy Soft tissue mobilization    Soft tissue mobilization using a prickly roller on the right lateral abdominal, right quadriceps, right ITB, right gluteal and paraspinals                    PT Short Term Goals - 08/19/20 1226      PT SHORT TERM GOAL #1   Title pt to be I with intial HEP     Time 4    Period Weeks    Status Achieved             PT Long Term Goals - 09/30/20 1523      PT LONG TERM GOAL #5   Title right hip strength >/= 4/5 so she is able to tolerate her daily tasks with pain decreased </= 5/10    Baseline pain level 5/10; right hip strength is 5/5; only able to stand for 10 minutes    Time 12    Period Weeks    Status On-going  Plan - 09/30/20 1528    Clinical Impression Statement Patient is taking medication that is making her lower body sore so she continues to have right abdominal pain. Patient has let her doctor be aware of this. Patient right hip strength is 5/5 but minimal change in right hip pain. Patient has tight right hip flexors, psoas and quadriceps. She feels better after exercise and stretching. Patient is  learning to contract the right guteal and elongate the right hip. Patient still needs stabilization of the core. Patient will benefit from skilled therapy to understand ways to manage her pain and improve overall strength and mobility.    Personal Factors and Comorbidities Comorbidity 3+;Age;Fitness;Sex    Comorbidities 2x cesaren section; exploratory laparotomy    Examination-Activity Limitations Toileting;Locomotion Level;Squat;Stairs;Stand;Sleep;Carry;Dressing;Bed Mobility    Examination-Participation Restrictions Community Activity;Interpersonal Relationship;Cleaning    Stability/Clinical Decision Making Evolving/Moderate complexity    Rehab Potential Good    PT Frequency 1x / week    PT Duration Other (comment)   5 weeks   PT Treatment/Interventions Biofeedback;Cryotherapy;Electrical Stimulation;Moist Heat;Ultrasound;Neuromuscular re-education;Therapeutic exercise;Therapeutic activities;Patient/family education;Manual techniques;Dry needling;Taping;Spinal Manipulations    PT Next Visit Plan stand with trunk rotation without moving pelvis; work on engaging the right gluteal, stretch right anterior trunk and hip    PT Home Exercise Plan Access Code: [email or web address]    Recommended Other Services sent MD renewal    Consulted and Agree with Plan of Care Patient           Patient will benefit from skilled therapeutic intervention in order to improve the following deficits and impairments:  Decreased range of motion, Increased fascial restricitons, Decreased endurance, Increased muscle spasms, Pain, Decreased strength, Decreased mobility  Visit Diagnosis: Muscle weakness (generalized) - Plan: PT plan of care cert/re-cert  Cramp and spasm - Plan: PT plan of care cert/re-cert  Generalized abdominal pain - Plan: PT plan of care cert/re-cert     Problem List Patient Active Problem List   Diagnosis Date Noted  . Moderate persistent asthma with acute exacerbation 08/01/2020  . Uterine fibroid  01/23/2016  . Right groin pain 01/23/2016  . History of C-section 01/23/2016  . Migraines   . Anxiety   . Depression     Earlie Counts, PT 09/30/20 4:47 PM   [facility] [address], [place], [place], [address] Phone: [phone]   Fax:  [phone]  Name: LEVITA MONICAL MRN: [id number] Date of Birth: 02-Dec-1980

## 2020-10-01 ENCOUNTER — Telehealth: Payer: Self-pay | Admitting: *Deleted

## 2020-10-01 NOTE — Telephone Encounter (Signed)
Patient called the office and spoke with [place].  Appointment time moved up for vaccine to 9:30 am.

## 2020-10-01 NOTE — Telephone Encounter (Signed)
Left message to return call to the office.  Need patient to come in the morning tomorrow for Covid vaccine.

## 2020-10-02 ENCOUNTER — Other Ambulatory Visit: Payer: Self-pay

## 2020-10-02 ENCOUNTER — Ambulatory Visit (INDEPENDENT_AMBULATORY_CARE_PROVIDER_SITE_OTHER): Payer: Medicaid Other

## 2020-10-02 ENCOUNTER — Ambulatory Visit: Payer: Medicaid Other

## 2020-10-02 DIAGNOSIS — Z23 Encounter for immunization: Secondary | ICD-10-CM

## 2020-10-05 ENCOUNTER — Telehealth: Payer: Self-pay | Admitting: *Deleted

## 2020-10-05 NOTE — Telephone Encounter (Addendum)
Called and spoke with patient.  Patient is doing well today.  Patient had sore arm, some nausea and fatigue over the weekend following her Covid vaccine.  No chest pain or other issues.  Patient will come to the office on 10/07/20 for her Xolair injection as planned.  Patient will contact the office if she has any issues or concerns.  Patient voiced understanding.  Althea Charon, FNP notified.

## 2020-10-07 ENCOUNTER — Ambulatory Visit: Payer: Medicaid Other | Admitting: Physical Therapy

## 2020-10-07 ENCOUNTER — Encounter: Payer: Self-pay | Admitting: Physical Therapy

## 2020-10-07 ENCOUNTER — Ambulatory Visit (INDEPENDENT_AMBULATORY_CARE_PROVIDER_SITE_OTHER): Payer: Medicaid Other | Admitting: *Deleted

## 2020-10-07 ENCOUNTER — Other Ambulatory Visit: Payer: Self-pay

## 2020-10-07 DIAGNOSIS — R252 Cramp and spasm: Secondary | ICD-10-CM

## 2020-10-07 DIAGNOSIS — M6281 Muscle weakness (generalized): Secondary | ICD-10-CM

## 2020-10-07 DIAGNOSIS — R1084 Generalized abdominal pain: Secondary | ICD-10-CM

## 2020-10-07 DIAGNOSIS — J4540 Moderate persistent asthma, uncomplicated: Secondary | ICD-10-CM | POA: Diagnosis not present

## 2020-10-07 NOTE — Therapy (Signed)
[facility] [address], [place], [place], [address] Phone: [phone]   Fax:  [phone]  Physical Therapy Treatment  Patient Details  Name: Penny [name] MRN: [id number] Date of Birth: Jun 11, 1981 Referring Provider (PT): Dr. Tommie Raymond   Encounter Date: 10/07/2020   PT End of Session - 10/07/20 1223    Visit Number 9    Date for PT Re-Evaluation 11/04/20    Authorization Type medicaid    Authorization Time Period 08/19/2020-11/10/20    Authorization - Visit Number 7    Authorization - Number of Visits 12    PT Start Time 1975    PT Stop Time 8832    PT Time Calculation (min) 39 min    Activity Tolerance Patient tolerated treatment well;No increased pain    Behavior During Therapy WFL for tasks assessed/performed           Past Medical History:  Diagnosis Date  . ADHD (attention deficit hyperactivity disorder)   . Anxiety    no meds  . Asthma   . Chronic back pain   . Depression    no meds  . Essential hypertension    a. Dx 06/2016.  . Migraines    last one over 1 month ago  . Overweight   . Palpitations - Sinus Tachycardia    a. 12/2016 Echo: E f55-60%, no rwma, nl LA/RA sizes.  . Placenta previa    w/ 2 pregnancies.  . Pre-diabetes   . Preterm labor    c/s at 36 wks  . Scoliosis   . Sinus tachycardia     Past Surgical History:  Procedure Laterality Date  . CESAREAN SECTION  2010   x 1 at 36 wks in [place]  . CESAREAN SECTION  February 13, 2012  . CHROMOPERTUBATION  10/26/2015   Procedure: ATTEMPTED CHROMOPERTUBATION;  Surgeon: Crawford Givens, MD;  Location: [facility];  Service: Gynecology;;  . LAPAROSCOPIC LYSIS OF ADHESIONS  10/26/2015   Procedure: LAPAROSCOPIC LYSIS OF ADHESIONS;  Surgeon: Crawford Givens, MD;  Location: [facility];  Service: Gynecology;;  . LAPAROSCOPY N/A 10/26/2015   Procedure: LAPAROSCOPY OPERATIVE WITH REMOVAL OF MISPLACED FILSHE CLIP;  Surgeon: Crawford Givens, MD;   Location: [facility];  Service: Gynecology;  Laterality: N/A;  . SVD  2003   x 1 in [place]  . TUBAL LIGATION    . WISDOM TOOTH EXTRACTION      There were no vitals filed for this visit.   Subjective Assessment - 10/07/20 1152    Subjective I had the dose of Xolair today. I did alot of walking so my abdominal pain is worse.    Patient Stated Goals reduce pain    Currently in Pain? Yes    Pain Score 7     Pain Location Abdomen    Pain Orientation Right    Pain Descriptors / Indicators Aching;Dull    Pain Type Chronic pain    Pain Onset More than a month ago    Pain Frequency Constant    Aggravating Factors  walking    Pain Relieving Factors massage it out    Multiple Pain Sites No              [facility] PT Assessment - 10/07/20 0001      AROM   Overall AROM Comments hip extension A/ROM in prone is -5 degrees      Strength   Right Hip Extension 4-/5  Pelvic Floor Special Questions - 10/07/20 0001    Currently Sexually Active Yes    Is this Painful Yes    Marinoff Scale pain interrupts completion             [facility] Adult PT Treatment/Exercise - 10/07/20 0001      Lumbar Exercises: Supine   Bridge 15 reps    Bridge Limitations VC to fully extend the hips    Straight Leg Raise 10 reps;1 second    Straight Leg Raises Limitations each leg, more pain on the right, VC to contract the lower abdominals    Other Supine Lumbar Exercises supine hip abduction 10 each leg with abdominal bracing      Lumbar Exercises: Sidelying   Other Sidelying Lumbar Exercises reverse clam 15x each side      Lumbar Exercises: Prone   Straight Leg Raise 15 reps;1 second    Straight Leg Raises Limitations each leg, needs VC to lift the right leg further      Manual Therapy   Manual Therapy Soft tissue mobilization    Soft tissue mobilization using the addaday to the right hip adductors, right hamstirng, quadricpes, gluteal, and hip rotators                     PT Short Term Goals - 08/19/20 1226      PT SHORT TERM GOAL #1   Title pt to be I with intial HEP     Time 4    Period Weeks    Status Achieved             PT Long Term Goals - 10/07/20 1209      PT LONG TERM GOAL #1   Title independent with advanced HEP    Time 12    Period Weeks    Status On-going      PT LONG TERM GOAL #2   Title understand ways to manage pain with meditation, breathing, and conservation of energy    Baseline continues to be educated    Time 12    Period Weeks    Status Achieved      PT LONG TERM GOAL #3   Title able to brace her abdomen during daily tasks to reduce the pain and improve her function    Baseline has not learned yet due to reducing pain    Time 12    Period Weeks    Status On-going      PT LONG TERM GOAL #4   Title able to start a walking program for 15 mintues a day due to pain decrease </= 5/10    Time 12    Period Weeks    Status Deferred      PT LONG TERM GOAL #5   Title right hip strength >/= 4/5 so she is able to tolerate her daily tasks with pain decreased </= 5/10    Baseline pain level 5/10; right hip strength is 5/5; only able to stand for 10 minutes    Time 12    Period Weeks    Status On-going                 Plan - 10/07/20 1225    Clinical Impression Statement Patient is able to engage the abdomen correctly for exercise. Patient has decreased right hip extension in prone actively to 5 degrees. Patient right hip extension strength is 4-/5. Patient reports she is now having constant pain in the right hip  at 4/10. Patient reports the pain is from her right hip down to the right knee. She is doing her exercises. Patient has not met goals today. Therapise suggested to patient to see her provider. Patient will benefit from skilled therapy to increase strength and reduce pain to improve function.    Personal Factors and Comorbidities Comorbidity 3+;Age;Fitness;Sex    Comorbidities 2x cesaren  section; exploratory laparotomy    Examination-Activity Limitations Toileting;Locomotion Level;Squat;Stairs;Stand;Sleep;Carry;Dressing;Bed Mobility    Examination-Participation Restrictions Community Activity;Interpersonal Relationship;Cleaning    Stability/Clinical Decision Making Evolving/Moderate complexity    Rehab Potential Good    PT Frequency 1x / week    PT Duration Other (comment)   5 weeks   PT Treatment/Interventions Biofeedback;Cryotherapy;Electrical Stimulation;Moist Heat;Ultrasound;Neuromuscular re-education;Therapeutic exercise;Therapeutic activities;Patient/family education;Manual techniques;Dry needling;Taping;Spinal Manipulations    PT Next Visit Plan stand with trunk rotation without moving pelvis; work on engaging the right gluteal, stretch right anterior trunk and hip    PT Home Exercise Plan Access Code: [email or web address]    Consulted and Agree with Plan of Care Patient           Patient will benefit from skilled therapeutic intervention in order to improve the following deficits and impairments:  Decreased range of motion, Increased fascial restricitons, Decreased endurance, Increased muscle spasms, Pain, Decreased strength, Decreased mobility  Visit Diagnosis: Muscle weakness (generalized)  Cramp and spasm  Generalized abdominal pain     Problem List Patient Active Problem List   Diagnosis Date Noted  . Moderate persistent asthma with acute exacerbation 08/01/2020  . Uterine fibroid 01/23/2016  . Right groin pain 01/23/2016  . History of C-section 01/23/2016  . Migraines   . Anxiety   . Depression     Earlie Counts, PT 10/07/20 12:31 PM    [facility] [address], [place], [place], [address] Phone: [phone]   Fax:  [phone]  Name: KELA BACCARI MRN: [id number] Date of Birth: September 15, 1981

## 2020-10-14 ENCOUNTER — Telehealth: Payer: Self-pay

## 2020-10-14 ENCOUNTER — Ambulatory Visit
Payer: Medicaid Other | Attending: Student in an Organized Health Care Education/Training Program | Admitting: Physical Therapy

## 2020-10-14 ENCOUNTER — Other Ambulatory Visit: Payer: Self-pay

## 2020-10-14 ENCOUNTER — Encounter: Payer: Self-pay | Admitting: Physical Therapy

## 2020-10-14 DIAGNOSIS — R252 Cramp and spasm: Secondary | ICD-10-CM | POA: Diagnosis present

## 2020-10-14 DIAGNOSIS — R1084 Generalized abdominal pain: Secondary | ICD-10-CM | POA: Insufficient documentation

## 2020-10-14 DIAGNOSIS — M6281 Muscle weakness (generalized): Secondary | ICD-10-CM

## 2020-10-14 NOTE — Therapy (Signed)
[facility] [address], [place], [place], [address] Phone: [phone]   Fax:  [phone]  Physical Therapy Treatment  Patient Details  Name: Penny [name] MRN: [id number] Date of Birth: 1981/08/07 Referring Provider (PT): Dr. Tommie Raymond   Encounter Date: 10/14/2020   PT End of Session - 10/14/20 1524    Visit Number 10    Date for PT Re-Evaluation 11/04/20    Authorization Type medicaid    Authorization Time Period 08/19/2020-11/10/20    Authorization - Visit Number 8    Authorization - Number of Visits 12    PT Start Time 8563    PT Stop Time 1524    PT Time Calculation (min) 39 min    Activity Tolerance Patient tolerated treatment well;No increased pain    Behavior During Therapy WFL for tasks assessed/performed           Past Medical History:  Diagnosis Date  . ADHD (attention deficit hyperactivity disorder)   . Anxiety    no meds  . Asthma   . Chronic back pain   . Depression    no meds  . Essential hypertension    a. Dx 06/2016.  . Migraines    last one over 1 month ago  . Overweight   . Palpitations - Sinus Tachycardia    a. 12/2016 Echo: E f55-60%, no rwma, nl LA/RA sizes.  . Placenta previa    w/ 2 pregnancies.  . Pre-diabetes   . Preterm labor    c/s at 36 wks  . Scoliosis   . Sinus tachycardia     Past Surgical History:  Procedure Laterality Date  . CESAREAN SECTION  2010   x 1 at 36 wks in [place]  . CESAREAN SECTION  February 13, 2012  . CHROMOPERTUBATION  10/26/2015   Procedure: ATTEMPTED CHROMOPERTUBATION;  Surgeon: Crawford Givens, MD;  Location: [facility];  Service: Gynecology;;  . LAPAROSCOPIC LYSIS OF ADHESIONS  10/26/2015   Procedure: LAPAROSCOPIC LYSIS OF ADHESIONS;  Surgeon: Crawford Givens, MD;  Location: [facility];  Service: Gynecology;;  . LAPAROSCOPY N/A 10/26/2015   Procedure: LAPAROSCOPY OPERATIVE WITH REMOVAL OF MISPLACED FILSHE CLIP;  Surgeon: Crawford Givens, MD;   Location: [facility];  Service: Gynecology;  Laterality: N/A;  . SVD  2003   x 1 [place]  . TUBAL LIGATION    . WISDOM TOOTH EXTRACTION      There were no vitals filed for this visit.   Subjective Assessment - 10/14/20 1453    Subjective I have been hurting more since I had my vaccine.    Patient Stated Goals reduce pain    Currently in Pain? Yes    Pain Score 8     Pain Location Abdomen    Pain Radiating Towards radiating into the right buttocks down right lateral leg into the right knee    Pain Onset More than a month ago    Pain Frequency Constant    Aggravating Factors  moving, lay on right side    Pain Relieving Factors sleep with pillow between legs.              [facility] PT Assessment - 10/14/20 0001      Assessment   Medical Diagnosis R10.2 Pelvic pain in female    Referring Provider (PT) Dr. Tommie Raymond    Onset Date/Surgical Date 02/13/12    Hand Dominance Right      Precautions   Precautions None  Palpation   SI assessment  ASIS are equal                         [facility] Adult PT Treatment/Exercise - 10/14/20 0001      Lumbar Exercises: Aerobic   Nustep 7 min level 1 while assessing patient      Lumbar Exercises: Supine   Other Supine Lumbar Exercises supine hip abduction then extension to stretch the right hip flexor then bring up;       Lumbar Exercises: Prone   Straight Leg Raise 15 reps    Straight Leg Raises Limitations right leg with knee straight then knee flexed    Other Prone Lumbar Exercises prone right hip abduction 30x      Knee/Hip Exercises: Standing   Functional Squat 1 set;15 reps    Functional Squat Limitations going down before ust touching the chair    Other Standing Knee Exercises stand on left leg and push the right foot into the wall contracting the buttocks holding for 5 seconds 10x; stand in lunge wiht left foot forward and pull the red band across body; stand on right leg and pivot to the left while holding the  red band    Other Standing Knee Exercises braiding to work on hip flexor stretch      Manual Therapy   Manual Therapy Joint mobilization    Joint Mobilization right hip mobilization for distraction, inferior glide, and long axis distraction grade III                    PT Short Term Goals - 08/19/20 1226      PT SHORT TERM GOAL #1   Title pt to be I with intial HEP     Time 4    Period Weeks    Status Achieved             PT Long Term Goals - 10/14/20 1528      PT LONG TERM GOAL #1   Title independent with advanced HEP    Time 12    Period Weeks    Status On-going      PT LONG TERM GOAL #3   Title able to brace her abdomen during daily tasks to reduce the pain and improve her function    Time 12    Period Weeks    Status On-going      PT LONG TERM GOAL #4   Title able to start a walking program for 15 mintues a day due to pain decrease </= 5/10    Baseline back pain decreased by 40% .    Time 12    Period Weeks    Status Deferred      PT LONG TERM GOAL #5   Title right hip strength >/= 4/5 so she is able to tolerate her daily tasks with pain decreased </= 5/10    Baseline pain level 5/10; right hip strength is 5/5; only able to stand for 10 minutes    Time 12    Period Weeks    Status On-going                 Plan - 10/14/20 1525    Clinical Impression Statement After exercise the pain went down to 5/10. Patient pelvis in correct alignment. Patient reports she has been more active since last visit. Patient was working today with hip extension, exercises that stretch the right hip flexor with  active movement and to engage the abdominals. Patient will benefit from skilled therapy to increase strength and reduce pain to improve funciton.    Personal Factors and Comorbidities Comorbidity 3+;Age;Fitness;Sex    Comorbidities 2x cesaren section; exploratory laparotomy    Examination-Activity Limitations Toileting;Locomotion  Level;Squat;Stairs;Stand;Sleep;Carry;Dressing;Bed Mobility    Examination-Participation Restrictions Community Activity;Interpersonal Relationship;Cleaning    Stability/Clinical Decision Making Evolving/Moderate complexity    Rehab Potential Good    PT Frequency 1x / week    PT Duration Other (comment)   5 weeks   PT Treatment/Interventions Biofeedback;Cryotherapy;Electrical Stimulation;Moist Heat;Ultrasound;Neuromuscular re-education;Therapeutic exercise;Therapeutic activities;Patient/family education;Manual techniques;Dry needling;Taping;Spinal Manipulations    PT Next Visit Plan stand with trunk rotation without moving pelvis; work on engaging the right gluteal, stretch right anterior trunk and hip; continue with the same exercise program and add to HEP    PT Home Exercise Plan Access Code: [email or web address]    Consulted and Agree with Plan of Care Patient           Patient will benefit from skilled therapeutic intervention in order to improve the following deficits and impairments:  Decreased range of motion, Increased fascial restricitons, Decreased endurance, Increased muscle spasms, Pain, Decreased strength, Decreased mobility  Visit Diagnosis: Muscle weakness (generalized)  Cramp and spasm  Generalized abdominal pain     Problem List Patient Active Problem List   Diagnosis Date Noted  . Moderate persistent asthma with acute exacerbation 08/01/2020  . Uterine fibroid 01/23/2016  . Right groin pain 01/23/2016  . History of C-section 01/23/2016  . Migraines   . Anxiety   . Depression     Earlie Counts, PT 10/14/20 3:29 PM   [facility] [address] [place], [place], [address] Phone: [phone]   Fax:  [phone]  Name: Penny [name] MRN: [id number] Date of Birth: Feb 07, 1981

## 2020-10-15 ENCOUNTER — Other Ambulatory Visit: Payer: Self-pay | Admitting: *Deleted

## 2020-10-15 MED ORDER — FLUTICASONE PROPIONATE HFA 220 MCG/ACT IN AERO: 2.0000 | INHALATION_SPRAY | Freq: Two times a day (BID) | RESPIRATORY_TRACT | 5 refills | Status: DC

## 2020-10-15 NOTE — Telephone Encounter (Signed)
I probably would not do that. I would rather add on more inhaled steroid. Let's do Flovent 255mcg two puffs twice daily for two weeks until she has improved.   Salvatore Marvel, MD [facility] [place] [facility] [place]

## 2020-10-15 NOTE — Telephone Encounter (Signed)
Called patient and advised of medication. Patient verbalized understanding. Medication has been sent in.

## 2020-10-15 NOTE — Telephone Encounter (Signed)
I would recommend that she use her nebulizer every 4 hours. She can also use some over-the-counter cough medicine such as Delsym. I would also confirm that she is using her Symbicort and Singulair.  Salvatore Marvel, MD [facility] [place]  [facility] [place]

## 2020-10-15 NOTE — Telephone Encounter (Signed)
Called and left message for patient to call back to confirm if she is using her Symbicort, Singulair and Albuterol correctly as directed on her last office visit.

## 2020-10-15 NOTE — Telephone Encounter (Signed)
Patient stated she received the Pfizer vaccine 2 weeks ago which flared up her Asthma causing cough and is doing her Symbicort and Albuterol medication as prescribed. Taking Singulair and Xyzal as prescribed. What other option can the patient do to improve her symptoms. Should patient increase Symbicort to 3 puffs bid. Please advise.

## 2020-10-16 NOTE — Telephone Encounter (Signed)
Called patient to follow up on how she was doing and if she started Flovent 220 mcg.  Left voicemail message for patient and will call patient back later today for follow up.

## 2020-10-20 ENCOUNTER — Ambulatory Visit (INDEPENDENT_AMBULATORY_CARE_PROVIDER_SITE_OTHER): Payer: Medicaid Other | Admitting: *Deleted

## 2020-10-20 ENCOUNTER — Other Ambulatory Visit: Payer: Self-pay

## 2020-10-20 DIAGNOSIS — J4540 Moderate persistent asthma, uncomplicated: Secondary | ICD-10-CM

## 2020-10-21 ENCOUNTER — Encounter: Payer: Self-pay | Admitting: Physical Therapy

## 2020-10-21 ENCOUNTER — Ambulatory Visit: Payer: Medicaid Other | Admitting: Physical Therapy

## 2020-10-21 DIAGNOSIS — M6281 Muscle weakness (generalized): Secondary | ICD-10-CM | POA: Diagnosis not present

## 2020-10-21 DIAGNOSIS — R1084 Generalized abdominal pain: Secondary | ICD-10-CM

## 2020-10-21 DIAGNOSIS — R252 Cramp and spasm: Secondary | ICD-10-CM

## 2020-10-21 NOTE — Therapy (Signed)
[facility] [address], [place], [place], [address] Phone: [phone]   Fax:  [phone]  Physical Therapy Treatment  Patient Details  Name: Penny [name] MRN: [id number] Date of Birth: 03-13-1981 Referring Provider (PT): Dr. Tommie Raymond   Encounter Date: 10/21/2020   PT End of Session - 10/21/20 0931    Visit Number 11    Date for PT Re-Evaluation 11/04/20    Authorization Type medicaid    Authorization Time Period 08/19/2020-11/10/20    Authorization - Visit Number 9    Authorization - Number of Visits 12    PT Start Time 0930    PT Stop Time 0160    PT Time Calculation (min) 38 min    Activity Tolerance Patient tolerated treatment well;No increased pain    Behavior During Therapy WFL for tasks assessed/performed           Past Medical History:  Diagnosis Date  . ADHD (attention deficit hyperactivity disorder)   . Anxiety    no meds  . Asthma   . Chronic back pain   . Depression    no meds  . Essential hypertension    a. Dx 06/2016.  . Migraines    last one over 1 month ago  . Overweight   . Palpitations - Sinus Tachycardia    a. 12/2016 Echo: E f55-60%, no rwma, nl LA/RA sizes.  . Placenta previa    w/ 2 pregnancies.  . Pre-diabetes   . Preterm labor    c/s at 36 wks  . Scoliosis   . Sinus tachycardia     Past Surgical History:  Procedure Laterality Date  . CESAREAN SECTION  2010   x 1 at 36 wks in [place]  . CESAREAN SECTION  February 13, 2012  . CHROMOPERTUBATION  10/26/2015   Procedure: ATTEMPTED CHROMOPERTUBATION;  Surgeon: Crawford Givens, MD;  Location: [facility];  Service: Gynecology;;  . LAPAROSCOPIC LYSIS OF ADHESIONS  10/26/2015   Procedure: LAPAROSCOPIC LYSIS OF ADHESIONS;  Surgeon: Crawford Givens, MD;  Location: [facility];  Service: Gynecology;;  . LAPAROSCOPY N/A 10/26/2015   Procedure: LAPAROSCOPY OPERATIVE WITH REMOVAL OF MISPLACED FILSHE CLIP;  Surgeon: Crawford Givens, MD;   Location: [facility];  Service: Gynecology;  Laterality: N/A;  . SVD  2003   x 1 [place]  . TUBAL LIGATION    . WISDOM TOOTH EXTRACTION      There were no vitals filed for this visit.   Subjective Assessment - 10/21/20 0933    Subjective I still have the same thing and pain in the right lower quadrant and goes down the right leg. Last visit helped but when start moving it begins to hurt.    Patient Stated Goals reduce pain    Currently in Pain? Yes    Pain Score 7     Pain Location Abdomen    Pain Orientation Right    Pain Descriptors / Indicators Aching;Dull    Pain Type Chronic pain    Pain Radiating Towards radiating into the right buttocks down right lateral leg into the right knee    Pain Onset More than a month ago    Pain Frequency Constant    Aggravating Factors  moving, lay on right side    Pain Relieving Factors sleep with pillow between legs    Multiple Pain Sites No              [facility] PT Assessment - 10/21/20 0001  Assessment   Medical Diagnosis R10.2 Pelvic pain in female    Referring Provider (PT) Dr. Tommie Raymond    Onset Date/Surgical Date 02/13/12    Hand Dominance Right      Strength   Right Hip Flexion 5/5    Right Hip Extension 4/5    Right Hip External Rotation  5/5    Right Hip ABduction 4+/5    Right Hip ADduction 4+/5                         [facility] Adult PT Treatment/Exercise - 10/21/20 0001      Lumbar Exercises: Stretches   Active Hamstring Stretch Right;Left;30 seconds;2 reps    Hip Flexor Stretch Right;2 reps;30 seconds    ITB Stretch Right;1 rep;30 seconds      Lumbar Exercises: Aerobic   Stationary Bike 10 minutes at level 1 while assessing patient      Lumbar Exercises: Supine   Clam 15 reps    Bridge 15 reps      Lumbar Exercises: Sidelying   Other Sidelying Lumbar Exercises sidely with lateral trunk stretch and gluteal squeeze      Lumbar Exercises: Prone   Straight Leg Raise 15 reps    Straight Leg  Raises Limitations right leg with knee straight then knee flexed    Other Prone Lumbar Exercises prone right hip abduction 30x                    PT Short Term Goals - 08/19/20 1226      PT SHORT TERM GOAL #1   Title pt to be I with intial HEP     Time 4    Period Weeks    Status Achieved             PT Long Term Goals - 10/14/20 1528      PT LONG TERM GOAL #1   Title independent with advanced HEP    Time 12    Period Weeks    Status On-going      PT LONG TERM GOAL #3   Title able to brace her abdomen during daily tasks to reduce the pain and improve her function    Time 12    Period Weeks    Status On-going      PT LONG TERM GOAL #4   Title able to start a walking program for 15 mintues a day due to pain decrease </= 5/10    Baseline back pain decreased by 40% .    Time 12    Period Weeks    Status Deferred      PT LONG TERM GOAL #5   Title right hip strength >/= 4/5 so she is able to tolerate her daily tasks with pain decreased </= 5/10    Baseline pain level 5/10; right hip strength is 5/5; only able to stand for 10 minutes    Time 12    Period Weeks    Status On-going                 Plan - 10/21/20 1006    Clinical Impression Statement Patient has weakness in the right hip. Patient is working on her right gluteal strength today. Patient has a tight right hip flexor. Patient feels better when she leaves therapy but her pain will return afterwards when she gets home. Patient pelvis in correct alignment. Patient leaves PT with pain level 5/10. Patient  is working on her endurance. Patient will benefit from from skilled therapy to increase strength and reduce pain to improve function.    Personal Factors and Comorbidities Comorbidity 3+;Age;Fitness;Sex    Comorbidities 2x cesaren section; exploratory laparotomy    Examination-Activity Limitations Toileting;Locomotion Level;Squat;Stairs;Stand;Sleep;Carry;Dressing;Bed Mobility     Examination-Participation Restrictions Community Activity;Interpersonal Relationship;Cleaning    Stability/Clinical Decision Making Evolving/Moderate complexity    Rehab Potential Good    PT Frequency 1x / week    PT Duration Other (comment)   5 weeks   PT Treatment/Interventions Biofeedback;Cryotherapy;Electrical Stimulation;Moist Heat;Ultrasound;Neuromuscular re-education;Therapeutic exercise;Therapeutic activities;Patient/family education;Manual techniques;Dry needling;Taping;Spinal Manipulations    PT Next Visit Plan go over HEP; possible discharge    PT Home Exercise Plan Access Code: [email or web address]    Consulted and Agree with Plan of Care Patient           Patient will benefit from skilled therapeutic intervention in order to improve the following deficits and impairments:  Decreased range of motion, Increased fascial restricitons, Decreased endurance, Increased muscle spasms, Pain, Decreased strength, Decreased mobility  Visit Diagnosis: Muscle weakness (generalized)  Cramp and spasm  Generalized abdominal pain     Problem List Patient Active Problem List   Diagnosis Date Noted  . Moderate persistent asthma with acute exacerbation 08/01/2020  . Uterine fibroid 01/23/2016  . Right groin pain 01/23/2016  . History of C-section 01/23/2016  . Migraines   . Anxiety   . Depression     Earlie Counts, PT 10/21/20 10:13 AM   [facility] [address], [place], [place], [address] Phone: [phone]   Fax:  [phone]  Name: Penny [name] MRN: [id number] Date of Birth: 1980/12/19

## 2020-10-23 ENCOUNTER — Ambulatory Visit (INDEPENDENT_AMBULATORY_CARE_PROVIDER_SITE_OTHER): Payer: Medicaid Other | Admitting: *Deleted

## 2020-10-23 ENCOUNTER — Other Ambulatory Visit: Payer: Self-pay

## 2020-10-23 DIAGNOSIS — Z23 Encounter for immunization: Secondary | ICD-10-CM

## 2020-10-23 NOTE — Progress Notes (Signed)
   Covid-19 Vaccination Clinic  Name:  Penny [name]    MRN: [id number] DOB: 1981/09/22  10/23/2020  [name] was observed post Covid-19 immunization for 30 minutes based on pre-vaccination screening without incident. She was provided with Vaccine Information Sheet and instruction to access the V-Safe system.   [name] was instructed to call 911 with any severe reactions post vaccine: Marland Kitchen Difficulty breathing  . Swelling of face and throat  . A fast heartbeat  . A bad rash all over body  . Dizziness and weakness   Immunizations Administered    Name Date Dose VIS Date Route   Pfizer COVID-19 Vaccine 10/23/2020  9:21 AM 0.3 mL 09/02/2020 Intramuscular   Manufacturer: [place]   Lot: [id number]   [place]: [id number]

## 2020-10-28 ENCOUNTER — Encounter: Payer: Medicaid Other | Admitting: Physical Therapy

## 2020-10-29 ENCOUNTER — Telehealth: Payer: Self-pay

## 2020-10-29 ENCOUNTER — Other Ambulatory Visit: Payer: Self-pay

## 2020-10-29 ENCOUNTER — Ambulatory Visit: Payer: Medicaid Other | Admitting: Physical Therapy

## 2020-10-29 ENCOUNTER — Encounter: Payer: Self-pay | Admitting: Physical Therapy

## 2020-10-29 DIAGNOSIS — R1084 Generalized abdominal pain: Secondary | ICD-10-CM

## 2020-10-29 DIAGNOSIS — R252 Cramp and spasm: Secondary | ICD-10-CM

## 2020-10-29 DIAGNOSIS — M6281 Muscle weakness (generalized): Secondary | ICD-10-CM

## 2020-10-29 NOTE — Telephone Encounter (Signed)
Patient is calling complaining she is having muscle aches, from her neck down to her back after receiving her 2 Pfizer vaccine on 10/23/20. She describes the pain as throbbing aching, burning and soreness since 10/24/2020 and been lasting consistently. Making her pain esculating from a minor to more severe type of pain. She has taken a muscle relaxer which helps but does not take the pain away. She is currently taking Advil 4 tablets equalling to 800 mg every 8 hours to help with the pain. Patient would like to know what side effect is causing her discomfort whether it is caused by the Xolair since she is on Xolair injections recently or her COVID vaccine.  Advise patient that COVID does cause minor aches and pains, and she is wondering if she should go back to PT.

## 2020-10-29 NOTE — Telephone Encounter (Signed)
Patient verbally expressed understanding. Patient said that she'd think about stopping. She believes that it was from the [place] but she'd call back to confirm if she'd stop for certain.

## 2020-10-29 NOTE — Telephone Encounter (Signed)
Two weeks is a long time to be caused by the vaccination. We could try taking her off of Xolair. I do not think that this is related to [place], however.   Salvatore Marvel, MD [facility]

## 2020-10-29 NOTE — Therapy (Signed)
[facility] [address] [place], [place], [address] Phone: [phone]   Fax:  [phone]  Physical Therapy Treatment  Patient Details  Name: Penny [name] MRN: [id number] Date of Birth: [birth year] Referring Provider (PT): Dr. Tommie Raymond   Encounter Date: 10/29/2020   PT End of Session - 10/29/20 0917    Visit Number 12    Date for PT Re-Evaluation 11/04/20    Authorization Type medicaid    Authorization Time Period 08/19/2020-11/10/20    Authorization - Visit Number 10    Authorization - Number of Visits 12    PT Start Time [phone]   transportation was late   PT Stop Time 0925    PT Time Calculation (min) 33 min    Activity Tolerance Patient tolerated treatment well;No increased pain    Behavior During Therapy WFL for tasks assessed/performed           Past Medical History:  Diagnosis Date  . ADHD (attention deficit hyperactivity disorder)   . Anxiety    no meds  . Asthma   . Chronic back pain   . Depression    no meds  . Essential hypertension    a. Dx 06/2016.  . Migraines    last one over 1 month ago  . Overweight   . Palpitations - Sinus Tachycardia    a. 12/2016 Echo: E f55-60%, no rwma, nl LA/RA sizes.  . Placenta previa    w/ 2 pregnancies.  . Pre-diabetes   . Preterm labor    c/s at 36 wks  . Scoliosis   . Sinus tachycardia     Past Surgical History:  Procedure Laterality Date  . CESAREAN SECTION  2010   x 1 at 36 wks in [place]  . CESAREAN SECTION  February 13, 2012  . CHROMOPERTUBATION  10/26/2015   Procedure: ATTEMPTED CHROMOPERTUBATION;  Surgeon: Crawford Givens, MD;  Location: [facility];  Service: Gynecology;;  . LAPAROSCOPIC LYSIS OF ADHESIONS  10/26/2015   Procedure: LAPAROSCOPIC LYSIS OF ADHESIONS;  Surgeon: Crawford Givens, MD;  Location: [facility];  Service: Gynecology;;  . LAPAROSCOPY N/A 10/26/2015   Procedure: LAPAROSCOPY OPERATIVE WITH REMOVAL OF MISPLACED FILSHE CLIP;   Surgeon: Crawford Givens, MD;  Location: [place] [facility];  Service: Gynecology;  Laterality: N/A;  . SVD  2003   x 1 in [place]  . TUBAL LIGATION    . WISDOM TOOTH EXTRACTION      There were no vitals filed for this visit.   Subjective Assessment - 10/29/20 0854    Subjective I see my primary doctor and I will be talking to her about my extra pain. The other day I had to take a muscle relaxer for the pain.    Patient Stated Goals reduce pain    Currently in Pain? Yes    Pain Score 7     Pain Location Abdomen    Pain Orientation Right    Pain Descriptors / Indicators Aching;Dull    Pain Type Chronic pain    Pain Radiating Towards radiating into the right buttocks down right lateral leg into the right knee    Pain Onset More than a month ago    Pain Frequency Constant    Aggravating Factors  moving, lay on the right side    Pain Relieving Factors slep with pillow between legs    Multiple Pain Sites No              [facility] PT  Assessment - 10/29/20 0001      Assessment   Medical Diagnosis R10.2 Pelvic pain in female    Referring Provider (PT) Dr. Tommie Raymond    Onset Date/Surgical Date 02/13/12    Hand Dominance Right      Restrictions   Weight Bearing Restrictions No      Cognition   Overall Cognitive Status Within Functional Limits for tasks assessed      Posture/Postural Control   Posture/Postural Control Postural limitations    Posture Comments scoliosis      AROM   Lumbar Flexion full with pain in Lumbar sacral area on the right    Lumbar Extension decreased by 50% with pain    Lumbar - Right Side Bend full      Strength   Right Hip Flexion 5/5    Right Hip Extension 4/5    Right Hip External Rotation  5/5    Right Hip ABduction 3/5    Right Hip ADduction 4/5      Palpation   SI assessment  ASIS are equal    Palpation comment tenderness located in the right lower quadrant and below the right rib cage; and right post. ilium area                       Pelvic Floor Special Questions - 10/29/20 0001    Number of Pregnancies 3    Number of C-Sections 2    Number of Vaginal Deliveries 1    Currently Sexually Active Yes    Is this Painful Yes    Marinoff Scale pain interrupts completion    Urinary Leakage No             [facility] Adult PT Treatment/Exercise - 10/29/20 0001      Lumbar Exercises: Supine   Clam 15 reps    Bridge 15 reps      Lumbar Exercises: Prone   Straight Leg Raise 15 reps    Straight Leg Raises Limitations right leg with knee straight then knee flexed    Other Prone Lumbar Exercises prone right hip abduction 30x      Manual Therapy   Manual therapy comments manually stretched the right hip flexor and hip adductor                  PT Education - 10/29/20 0929    Education Details Access Code: [email or web address]    Person(s) Educated Patient    Methods Explanation;Demonstration;Handout    Comprehension Returned demonstration;Verbalized understanding            PT Short Term Goals - 08/19/20 1226      PT SHORT TERM GOAL #1   Title pt to be I with intial HEP     Time 4    Period Weeks    Status Achieved             PT Long Term Goals - 10/29/20 5188      PT LONG TERM GOAL #1   Title independent with advanced HEP    Baseline educated with different exercises as she is advanced    Time 12    Period Weeks    Status Achieved      PT LONG TERM GOAL #2   Title understand ways to manage pain with meditation, breathing, and conservation of energy    Time 12    Status Achieved      PT LONG TERM GOAL #3  Title able to brace her abdomen during daily tasks to reduce the pain and improve her function    Time 12    Period Weeks    Status Achieved      PT LONG TERM GOAL #4   Title able to start a walking program for 15 mintues a day due to pain decrease </= 5/10    Baseline 5 minute walk without pain    Time 12    Period Weeks    Status Partially Met      PT LONG TERM GOAL #5   Title right hip  strength >/= 4/5 so she is able to tolerate her daily tasks with pain decreased </= 5/10    Time 12    Period Weeks    Status Partially Met                 Plan - 10/29/20 6144    Clinical Impression Statement Patient will defer the internal manual work when asked in therapy. Patients pain continues to have 7/10 pain. She will feel better after therapy but the pain will return. Right hip strength is weaker than last visit. Patient has palpable tenderness located in the right lower quadrant and below right rib cage. Patient pelvis in correct alignment. Lumbar extension is limited by 50%. Patient is independent with her HEP when at therapy. Patient reports it is hard for her to do the exercises at home due to her right pain. Patient understands how to manage her pain with warm baths, stretches. Patient has not been able to do the meditation due to losing the link. Patient is being discharged due to maximizing her therapy at this time and her pain is not changing.    Personal Factors and Comorbidities Comorbidity 3+;Age;Fitness;Sex    Comorbidities 2x cesaren section; exploratory laparotomy    Examination-Activity Limitations Toileting;Locomotion Level;Squat;Stairs;Stand;Sleep;Carry;Dressing;Bed Mobility    Examination-Participation Restrictions Community Activity;Interpersonal Relationship;Cleaning    Stability/Clinical Decision Making Evolving/Moderate complexity    Rehab Potential Good    PT Treatment/Interventions Biofeedback;Cryotherapy;Electrical Stimulation;Moist Heat;Ultrasound;Neuromuscular re-education;Therapeutic exercise;Therapeutic activities;Patient/family education;Manual techniques;Dry needling;Taping;Spinal Manipulations    PT Next Visit Plan Discharge to HEP    PT Home Exercise Plan Access Code: [email or web address]    Recommended Other Services MD notes are signed    Consulted and Agree with Plan of Care Patient           Patient will benefit from skilled therapeutic intervention  in order to improve the following deficits and impairments:  Decreased range of motion,Increased fascial restricitons,Decreased endurance,Increased muscle spasms,Pain,Decreased strength,Decreased mobility  Visit Diagnosis: Muscle weakness (generalized)  Cramp and spasm  Generalized abdominal pain     Problem List Patient Active Problem List   Diagnosis Date Noted  . Moderate persistent asthma with acute exacerbation 08/01/2020  . Uterine fibroid 01/23/2016  . Right groin pain 01/23/2016  . History of C-section 01/23/2016  . Migraines   . Anxiety   . Depression     Earlie Counts, PT 10/29/20 9:31 AM   [facility] [address] [place], [place], [address] Phone: [phone]   Fax:  [phone]  Name: Penny [name] MRN: [id number] Date of Birth: [birth year]  PHYSICAL THERAPY DISCHARGE SUMMARY  Visits from Start of Care: 12  Current functional level related to goals / functional outcomes: See above.    Remaining deficits: See above. Continues to have her pain at level 7/10 in right quadrant and weakness in right hip and core.  Education / Equipment: HEP Plan: Patient agrees to discharge.  Patient goals were partially met. Patient is being discharged due to                                                     Maximized her progress in therapy. Thank you for the referral. Earlie Counts, PT 10/29/20 9:32 AM  ?????

## 2020-10-29 NOTE — Patient Instructions (Signed)
Access Code: [email or web address] URL: [email or web address] Date: 10/29/2020 Prepared by: Earlie Counts  Program Notes  Guided Meditation for Pelvic Floor Relaxation  [employer] - YouTube   Exercises Supine Hamstring Stretch - 1 x daily - 7 x weekly - 1 sets - 2 reps - 30 sec hold Supine Butterfly Groin Stretch - 1 x daily - 7 x weekly - 1 sets - 1 reps - 30 sec hold Supine Figure 4 Piriformis Stretch - 1 x daily - 7 x weekly - 1 sets - 2 reps - 30 sec hold Supine Lower Trunk Rotation - 1 x daily - 7 x weekly - 1 sets - 2 reps - 15 sec hold Bent Knee Fallouts - 1 x daily - 7 x weekly - 2 sets - 10 reps Supine Bridge - 1 x daily - 7 x weekly - 10 reps Supine Transversus Abdominis Bracing with Heel Slide - 1 x daily - 7 x weekly - 2 sets - 10 reps Prone Hip Extension - Two Pillows - 1 x daily - 7 x weekly - 2 sets - 15 reps Prone Hip Extension with Bent Knee - 1 x daily - 7 x weekly - 1 sets - 15 reps Prone Hip Abduction on Slider - 1 x daily - 7 x weekly - 2 sets - 10 reps [facility] [address] [place], [place] [address] Phone # [phone] Fax [phone]

## 2020-11-03 ENCOUNTER — Ambulatory Visit (INDEPENDENT_AMBULATORY_CARE_PROVIDER_SITE_OTHER): Payer: Medicaid Other | Admitting: *Deleted

## 2020-11-03 ENCOUNTER — Other Ambulatory Visit: Payer: Self-pay

## 2020-11-03 DIAGNOSIS — J4540 Moderate persistent asthma, uncomplicated: Secondary | ICD-10-CM

## 2020-11-04 ENCOUNTER — Encounter: Payer: Medicaid Other | Admitting: Physical Therapy

## 2020-11-11 ENCOUNTER — Telehealth: Payer: Self-pay | Admitting: Allergy

## 2020-11-11 NOTE — Telephone Encounter (Signed)
Called and left a message for patient to all our office back. Called to see how she was ding and if she went to the ED as advised.
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
## 2020-11-11 NOTE — Telephone Encounter (Signed)
Called and left a voicemail asking for patient to return call to see how she is doing.  

## 2020-11-11 NOTE — Telephone Encounter (Signed)
Patient called this morning at 6:15AM stating she can't breathe. She used her rescue inhaler 4 puffs with no benefit.  On the phone patient was having difficulty speaking/breathing and stopping after each word.  I advised patient to call 911.   Please call patient to check on her - I don't see any ER/UC visits so far in our EMR system.

## 2020-11-17 ENCOUNTER — Ambulatory Visit (HOSPITAL_COMMUNITY)
Admission: EM | Admit: 2020-11-17 | Discharge: 2020-11-17 | Discharge status: Against Medical Advice | Payer: Medicaid Other

## 2020-11-17 ENCOUNTER — Other Ambulatory Visit: Payer: Self-pay

## 2020-11-17 ENCOUNTER — Ambulatory Visit (INDEPENDENT_AMBULATORY_CARE_PROVIDER_SITE_OTHER): Payer: Medicaid Other

## 2020-11-17 DIAGNOSIS — J4540 Moderate persistent asthma, uncomplicated: Secondary | ICD-10-CM | POA: Diagnosis not present

## 2020-11-17 DIAGNOSIS — R059 Cough, unspecified: Secondary | ICD-10-CM | POA: Diagnosis not present

## 2020-11-17 DIAGNOSIS — J4541 Moderate persistent asthma with (acute) exacerbation: Secondary | ICD-10-CM | POA: Diagnosis not present

## 2020-11-18 ENCOUNTER — Encounter (HOSPITAL_COMMUNITY): Payer: Self-pay

## 2020-11-18 ENCOUNTER — Ambulatory Visit (HOSPITAL_COMMUNITY)
Admission: EM | Admit: 2020-11-18 | Discharge: 2020-11-18 | Disposition: A | Discharge status: Home / Self Care | Payer: Medicaid Other | Attending: Internal Medicine | Admitting: Internal Medicine

## 2020-11-18 DIAGNOSIS — M25562 Pain in left knee: Secondary | ICD-10-CM

## 2020-11-18 DIAGNOSIS — R059 Cough, unspecified: Secondary | ICD-10-CM | POA: Diagnosis not present

## 2020-11-18 MED ORDER — KETOROLAC TROMETHAMINE 60 MG/2ML IM SOLN
INTRAMUSCULAR | Status: AC
  Filled 2020-11-18: qty 2

## 2020-11-18 MED ORDER — MELOXICAM 15 MG PO TABS: 15.0000 mg | ORAL_TABLET | Freq: Every day | ORAL | 0 refills | Status: AC

## 2020-11-18 MED ORDER — KETOROLAC TROMETHAMINE 60 MG/2ML IM SOLN
60.0000 mg | Freq: Once | INTRAMUSCULAR | Status: AC
  Administered 2020-11-18: 60 mg via INTRAMUSCULAR

## 2020-11-18 NOTE — ED Provider Notes (Signed)
[facility]    CSN: [id number] Arrival date & time: 11/18/20  4782      History   Chief Complaint Chief Complaint  Patient presents with  . Cough    HPI Penny [name] is a 40 y.o. female with past medical history of asthma presents to urgent care with complaints of cough since July 2021. Patient states cough is unchanged however now she has back and side pain due to her forceful coughing. Pain worse upon any movement. Patient states she is compliant with all medications prescribed by allergist. PCP prescribed Pepcid and Nexium for chronic cough. She has been taking these medications since last Friday and feels that they have helped with cough. Patient also reports 2-day history of left knee pain, worse with any movement. She denies any trauma or change in physical activity, no numbness or tingling. Patient feels all her symptoms are related to Covid vaccine received last month. Pt denies any sob, though states she becomes fatigued with exertion, no hemoptysis.    Past Medical History:  Diagnosis Date  . ADHD (attention deficit hyperactivity disorder)   . Anxiety    no meds  . Asthma   . Chronic back pain   . Depression    no meds  . Essential hypertension    a. Dx 06/2016.  . Migraines    last one over 1 month ago  . Overweight   . Palpitations - Sinus Tachycardia    a. 12/2016 Echo: E f55-60%, no rwma, nl LA/RA sizes.  . Placenta previa    w/ 2 pregnancies.  . Pre-diabetes   . Preterm labor    c/s at 36 wks  . Scoliosis   . Sinus tachycardia     Patient Active Problem List   Diagnosis Date Noted  . Moderate persistent asthma with acute exacerbation 08/01/2020  . Uterine fibroid 01/23/2016  . Right groin pain 01/23/2016  . History of C-section 01/23/2016  . Migraines   . Anxiety   . Depression     Past Surgical History:  Procedure Laterality Date  . CESAREAN SECTION  2010   x 1 at 36 wks in [place]  . CESAREAN SECTION  February 13, 2012  .  CHROMOPERTUBATION  10/26/2015   Procedure: ATTEMPTED CHROMOPERTUBATION;  Surgeon: Crawford Givens, MD;  Location: [facility];  Service: Gynecology;;  . LAPAROSCOPIC LYSIS OF ADHESIONS  10/26/2015   Procedure: LAPAROSCOPIC LYSIS OF ADHESIONS;  Surgeon: Crawford Givens, MD;  Location: [facility];  Service: Gynecology;;  . LAPAROSCOPY N/A 10/26/2015   Procedure: LAPAROSCOPY OPERATIVE WITH REMOVAL OF MISPLACED FILSHE CLIP;  Surgeon: Crawford Givens, MD;  Location: [facility];  Service: Gynecology;  Laterality: N/A;  . SVD  2003   x 1 in [place]  . TUBAL LIGATION    . WISDOM TOOTH EXTRACTION      OB History    Gravida  3   Para  3   Term  1   Preterm  2   AB  0   Living  3     SAB  0   IAB  0   Ectopic  0   Multiple  0   Live Births  3            Home Medications    Prior to Admission medications   Medication Sig Start Date End Date Taking? Authorizing Provider  meloxicam (MOBIC) 15 MG tablet Take 1 tablet (15 mg total) by mouth daily. 11/18/20 12/18/20 Yes Rudolpho Sevin,  NP  ACCU-CHEK GUIDE test strip USE 1 strip 2 TIMES DAILY BEFORE MEALS 04/23/20   [provider]  Accu-Chek Softclix Lancets lancets 2 (two) times daily. 04/24/20   [provider]  albuterol (PROVENTIL) (2.5 MG/3ML) 0.083% nebulizer solution Take 3 mLs (2.5 mg total) by nebulization every 6 (six) hours as needed for wheezing or shortness of breath. 08/14/20   Vanessa Kick, MD  benzonatate (TESSALON PERLES) 100 MG capsule Take 1 capsule (100 mg total) by mouth 2 (two) times daily as needed for cough. 05/27/20 05/27/21  Rudolpho Sevin, NP  Blood Glucose Monitoring Suppl (ACCU-CHEK GUIDE) w/Device KIT See admin instructions. 04/23/20   [provider]  budesonide-formoterol (SYMBICORT) 160-4.5 MCG/ACT inhaler Inhale 2 puffs into the lungs in the morning and at bedtime. 08/01/20   Faustino Congress, NP  EPINEPHrine 0.3 mg/0.3 mL IJ SOAJ injection SMARTSIG:1 Syringe(s) IM PRN 01/14/20   [provider]  EPINEPHrine 0.3 mg/0.3 mL IJ SOAJ injection Inject 0.3 mg into the muscle as needed for anaphylaxis. As needed for life-threatening allergic reactions 08/27/20   Valentina Shaggy, MD  esomeprazole (NEXIUM) 40 MG capsule Take 1 capsule (40 mg total) by mouth daily. 08/14/20   Vanessa Kick, MD  fluticasone (FLOVENT HFA) 220 MCG/ACT inhaler Inhale 2 puffs into the lungs in the morning and at bedtime. 10/15/20   Valentina Shaggy, MD  gabapentin (NEURONTIN) 800 MG tablet Take 800 mg by mouth 3 (three) times daily.    [provider]  ipratropium (ATROVENT) 0.03 % nasal spray Place 2 sprays into both nostrils every 12 (twelve) hours. 05/28/20   Lamptey, Myrene Galas, MD  levocetirizine (XYZAL) 5 MG tablet Take 1 tablet (5 mg total) by mouth every evening. 07/29/20   Volney American, PA-C  levonorgestrel (MIRENA, 52 MG,) 20 MCG/24HR IUD Mirena 20 mcg/24 hours (7 yrs) 52 mg intrauterine device  Take by intrauterine route.    [provider]  metoprolol succinate (TOPROL-XL) 25 MG 24 hr tablet Take 25 mg by mouth 2 (two) times daily. 07/29/20   [provider]  montelukast (SINGULAIR) 10 MG tablet Take 1 tablet (10 mg total) by mouth at bedtime. 07/29/20   Volney American, PA-C  nicotine (NICODERM CQ - DOSED IN MG/24 HOURS) 14 mg/24hr patch Place 1 patch (14 mg total) onto the skin daily. 08/27/20   Valentina Shaggy, MD  Olopatadine HCl (PATADAY) 0.2 % SOLN Place 1 drop into both eyes daily as needed. 09/25/20   Valentina Shaggy, MD  promethazine (PHENERGAN) 12.5 MG tablet Take 1 tablet (12.5 mg total) by mouth every 6 (six) hours as needed for nausea or vomiting. 07/29/20   Volney American, PA-C  promethazine (PHENERGAN) 25 MG tablet Take 1 tablet (25 mg total) by mouth every 6 (six) hours as needed for up to 3 days for nausea or vomiting. 06/13/20 06/16/20  Konrad Felix, PA-C  tiZANidine (ZANAFLEX) 4 MG tablet Take 4 mg by mouth every  6 (six) hours as needed for muscle spasms.    [provider]  XOPENEX HFA 45 MCG/ACT inhaler Inhale 2 puffs into the lungs every 4 (four) hours as needed for wheezing. 09/01/20   Valentina Shaggy, MD  Cetirizine HCl 10 MG CAPS Take 1 capsule (10 mg total) by mouth daily for 10 days. Patient not taking: Reported on 06/22/2019 02/11/19 08/18/19  Wieters, Elesa Hacker, PA-C  Dextromethorphan HBr 10 MG/5ML SYRP 10 ml qhs prn cough at night time. Patient  not taking: Reported on 06/22/2019 02/04/19 08/18/19  Rodriguez[facility], Sunday Spillers, PA-C  fluticasone [facility]) 50 MCG/ACT nasal spray Place 1 spray into both nostrils daily. 05/27/20 05/28/20  Rudolpho Sevin, NP  norethindrone (AYGESTIN) 5 MG tablet Take by mouth. 05/07/20 05/28/20  [provider]  Topiramate ER (TROKENDI XR) 200 MG CP24 Take 1 capsule by mouth daily. 08/27/19 05/28/20  [provider]    Family History Family History  Problem Relation Age of Onset  . Cancer Mother   . CAD Mother   . Hypertension Sister   . Sickle cell trait Daughter   . Asthma Son   . Asthma Paternal Grandmother   . Pseudochol deficiency Neg Hx   . Malignant hyperthermia Neg Hx   . Anesthesia problems Neg Hx   . Hypotension Neg Hx     Social History Social History   Tobacco Use  . Smoking status: Current Every Day Smoker    Packs/day: 0.10    Years: 18.00    Pack years: 1.80    Types: Cigarettes  . Smokeless tobacco: Never Used  Vaping Use  . Vaping Use: Former  Substance Use Topics  . Alcohol use: Yes    Comment: Rare   . Drug use: Not Currently    Types: Marijuana     Allergies   Other, Shrimp extract allergy skin test, and Shrimp [shellfish allergy]   Review of Systems As stated in hpi, otherwise neg   Physical Exam Triage Vital Signs ED Triage Vitals  Enc Vitals Group     BP 11/18/20 0910 129/86     Pulse Rate 11/18/20 0910 81     Resp 11/18/20 0910 20     Temp --      Temp Source 11/18/20 0910 Oral      SpO2 11/18/20 0910 98 %     Weight --      Height --      Head Circumference --      Peak Flow --      Pain Score 11/18/20 0909 8     Pain Loc --      Pain Edu? --      Excl. in [place]? --    No data found.  Updated Vital Signs BP 129/86   Pulse 81   Resp 20   SpO2 98%   Visual Acuity Right Eye Distance:   Left Eye Distance:   Bilateral Distance:    Right Eye Near:   Left Eye Near:    Bilateral Near:     Physical Exam Constitutional:      General: She is not in acute distress.    Appearance: Normal appearance. She is not ill-appearing.  HENT:     Right Ear: Tympanic membrane normal.     Left Ear: Tympanic membrane normal.     Nose: No congestion or rhinorrhea.     Mouth/Throat:     Mouth: Mucous membranes are moist.     Pharynx: No oropharyngeal exudate.  Eyes:     Extraocular Movements: Extraocular movements intact.  Cardiovascular:     Rate and Rhythm: Normal rate and regular rhythm.  Pulmonary:     Effort: Pulmonary effort is normal.     Breath sounds: Normal breath sounds.  Abdominal:     General: Bowel sounds are normal.     Palpations: Abdomen is soft.  Musculoskeletal:        General: No swelling or deformity. Normal range of motion.     Cervical back:  Normal range of motion and neck supple.     Comments: TTP with palpation of chest wall along upper back and left axillary region  Skin:    General: Skin is warm and dry.  Neurological:     General: No focal deficit present.     Mental Status: She is alert and oriented to person, place, and time.  Psychiatric:        Mood and Affect: Mood normal.        Behavior: Behavior normal.      UC Treatments / Results  Labs (all labs ordered are listed, but only abnormal results are displayed) Labs Reviewed - No data to display  EKG   Radiology No results found.  Procedures Procedures (including critical care time)  Medications Ordered in UC Medications  ketorolac (TORADOL) injection 60 mg (60 mg  Intramuscular Given 11/18/20 0942)    Initial Impression / Assessment and Plan / UC Course  I have reviewed the triage vital signs and the nursing notes.  Pertinent labs & imaging results that were available during my care of the patient were reviewed by me and considered in my medical decision making (see chart for details).  Cough with assoc costochondritis -Unclear etiology of cough though patient states seems to be improving with recently prescribed GERD medications -Asthma seems well controlled with no evidence of exacerbation on exam -Despite left knee pain, very low suspicion for DVT not short of breath, hypoxic cardiac. No hemoptysis and low overall risk -IM Toradol in office.Try meloxicam for chest pain. Patient instructed to stop taking diclofenac. -Continue previously prescribed medications -Strict follow-up precautions discussed  Reviewed expections re: course of current medical issues. Questions answered. Outlined signs and symptoms indicating need for more acute intervention. Pt verbalized understanding. AVS given  Final Clinical Impressions(s) / UC Diagnoses   Final diagnoses:  Cough  Acute pain of left knee     Discharge Instructions     Stop taking the diclofenac and start meloxicam daily for now to see if this helps with your discomfort.  Continue taking other medications prescribed by your primary care provider and allergist.  Follow-up with primary care if knee pain persist.  You will need to go to the emergency department for any shortness of breath/difficulty breathing.    ED Prescriptions    Medication Sig Dispense Auth. Provider   meloxicam (MOBIC) 15 MG tablet Take 1 tablet (15 mg total) by mouth daily. 30 tablet Rudolpho Sevin, NP     PDMP not reviewed this encounter.   Rudolpho Sevin, NP 11/18/20 1314

## 2020-11-18 NOTE — Discharge Instructions (Addendum)
Stop taking the diclofenac and start meloxicam daily for now to see if this helps with your discomfort.  Continue taking other medications prescribed by your primary care provider and allergist.  Follow-up with primary care if knee pain persist.  You will need to go to the emergency department for any shortness of breath/difficulty breathing.

## 2020-11-18 NOTE — ED Triage Notes (Signed)
Pt in with c/o cough that has been going on since July and right side back pain that has been going on for 2 weeks now. Also c/o left leg pain. States that she has been coughing so hard it's making her hurt.   States that she was recently diagnosed with asthma a few months ago and has been using her prescribed inhalers.

## 2020-12-03 ENCOUNTER — Other Ambulatory Visit: Payer: Self-pay

## 2020-12-03 ENCOUNTER — Encounter: Payer: Self-pay | Admitting: Allergy & Immunology

## 2020-12-03 ENCOUNTER — Ambulatory Visit (INDEPENDENT_AMBULATORY_CARE_PROVIDER_SITE_OTHER): Payer: Medicaid Other

## 2020-12-03 DIAGNOSIS — J4540 Moderate persistent asthma, uncomplicated: Secondary | ICD-10-CM

## 2020-12-17 ENCOUNTER — Other Ambulatory Visit: Payer: Self-pay

## 2020-12-17 ENCOUNTER — Ambulatory Visit (INDEPENDENT_AMBULATORY_CARE_PROVIDER_SITE_OTHER): Payer: Medicaid Other

## 2020-12-17 ENCOUNTER — Encounter: Payer: Self-pay | Admitting: Allergy & Immunology

## 2020-12-17 DIAGNOSIS — J4540 Moderate persistent asthma, uncomplicated: Secondary | ICD-10-CM

## 2020-12-31 ENCOUNTER — Ambulatory Visit (INDEPENDENT_AMBULATORY_CARE_PROVIDER_SITE_OTHER): Payer: Medicaid Other | Admitting: *Deleted

## 2020-12-31 ENCOUNTER — Other Ambulatory Visit: Payer: Self-pay

## 2020-12-31 DIAGNOSIS — J4540 Moderate persistent asthma, uncomplicated: Secondary | ICD-10-CM | POA: Diagnosis not present

## 2021-01-06 ENCOUNTER — Ambulatory Visit: Payer: Medicaid Other | Admitting: Physical Therapy

## 2021-01-14 ENCOUNTER — Other Ambulatory Visit: Payer: Self-pay

## 2021-01-14 ENCOUNTER — Ambulatory Visit (INDEPENDENT_AMBULATORY_CARE_PROVIDER_SITE_OTHER): Payer: Medicaid Other

## 2021-01-14 DIAGNOSIS — J4540 Moderate persistent asthma, uncomplicated: Secondary | ICD-10-CM | POA: Diagnosis not present

## 2021-01-20 ENCOUNTER — Other Ambulatory Visit: Payer: Self-pay | Admitting: Obstetrics and Gynecology

## 2021-01-28 ENCOUNTER — Other Ambulatory Visit: Payer: Self-pay

## 2021-01-28 ENCOUNTER — Ambulatory Visit (INDEPENDENT_AMBULATORY_CARE_PROVIDER_SITE_OTHER): Payer: Medicaid Other | Admitting: *Deleted

## 2021-01-28 DIAGNOSIS — J4540 Moderate persistent asthma, uncomplicated: Secondary | ICD-10-CM | POA: Diagnosis not present

## 2021-02-08 ENCOUNTER — Emergency Department (HOSPITAL_COMMUNITY)
Admission: EM | Admit: 2021-02-08 | Discharge: 2021-02-08 | Disposition: A | Discharge status: Home / Self Care | Payer: Medicaid Other | Attending: Emergency Medicine | Admitting: Emergency Medicine

## 2021-02-08 ENCOUNTER — Other Ambulatory Visit: Payer: Self-pay

## 2021-02-08 ENCOUNTER — Emergency Department (HOSPITAL_COMMUNITY): Payer: Medicaid Other

## 2021-02-08 ENCOUNTER — Telehealth: Payer: Self-pay

## 2021-02-08 DIAGNOSIS — Z79899 Other long term (current) drug therapy: Secondary | ICD-10-CM | POA: Insufficient documentation

## 2021-02-08 DIAGNOSIS — R000 Tachycardia, unspecified: Secondary | ICD-10-CM | POA: Insufficient documentation

## 2021-02-08 DIAGNOSIS — Z7951 Long term (current) use of inhaled steroids: Secondary | ICD-10-CM | POA: Diagnosis not present

## 2021-02-08 DIAGNOSIS — I10 Essential (primary) hypertension: Secondary | ICD-10-CM | POA: Insufficient documentation

## 2021-02-08 DIAGNOSIS — J40 Bronchitis, not specified as acute or chronic: Secondary | ICD-10-CM

## 2021-02-08 DIAGNOSIS — J0100 Acute maxillary sinusitis, unspecified: Secondary | ICD-10-CM | POA: Diagnosis not present

## 2021-02-08 DIAGNOSIS — F17210 Nicotine dependence, cigarettes, uncomplicated: Secondary | ICD-10-CM | POA: Insufficient documentation

## 2021-02-08 DIAGNOSIS — R059 Cough, unspecified: Secondary | ICD-10-CM

## 2021-02-08 MED ORDER — ALUM & MAG HYDROXIDE-SIMETH 200-200-20 MG/5ML PO SUSP
30.0000 mL | Freq: Once | ORAL | Status: AC
  Administered 2021-02-08: 30 mL via ORAL
  Filled 2021-02-08: qty 30

## 2021-02-08 MED ORDER — PREDNISONE 20 MG PO TABS: 40.0000 mg | ORAL_TABLET | Freq: Every day | ORAL | 0 refills | Status: AC

## 2021-02-08 MED ORDER — LIDOCAINE VISCOUS HCL 2 % MT SOLN
15.0000 mL | Freq: Once | OROMUCOSAL | Status: AC
  Administered 2021-02-08: 15 mL via ORAL
  Filled 2021-02-08: qty 15

## 2021-02-08 MED ORDER — ALUM & MAG HYDROXIDE-SIMETH 400-400-40 MG/5ML PO SUSP: 10.0000 mL | Freq: Four times a day (QID) | ORAL | 0 refills | Status: DC | PRN

## 2021-02-08 NOTE — Telephone Encounter (Signed)
Patient called wanting to reschedule her Xolair appointment due to being in the emergency room and being diagnosed with bronchitis and was prescribed prednisone. Patient also stated that she has an appointment with her PCP this afternoon to follow up. While on phone patient stated that she is having some shortness of breathe due to being sick and that she has used her Xopenex inhaler once today. Patient requested to be seen in clinic today. After speaking with Beth I informed patient that she will need to give the medication time to work since she was seen in the emergency room today and has a follow up with her PCP this afternoon. I did recommend patient to continue her Symbicort daily and add her Xopenex inhaler 4 puffs every 4-6 hours as needed per last after visit summary and to follow the emergency rooms recommendation and if she is still having problems to call the office. Patient verbalized understanding.

## 2021-02-08 NOTE — Telephone Encounter (Signed)
Called patient and left voicemail message.  Will call patient tomorrow to follow up on how she is doing and how appointment with PCP went.

## 2021-02-08 NOTE — ED Triage Notes (Addendum)
Pt via ems to room 03. Pt reports cough and  congestion x 2-3 days. Pt reports cough with nasal greenish mucus discharge. Hx asthma/bronchiolitis. Diagnosed in september of 2021. Patient able to speak full sentences. Pt also reports increase in SOB only when coughing. 98% on room air. Denies chest pain.

## 2021-02-08 NOTE — Discharge Instructions (Addendum)
You came to the emergency department to be evaluated for your sinus pressure, nasal congestion, and cough.  Your chest x-ray showed no signs of pneumonia.  Your symptoms are likely due to a sinus infection as well as bronchitis.  Both of these are most often caused by viral infections and improved with time.  I have given you a prescription for prednisone to help with your breathing.  I have also given you a prescription for Mylanta to help with the burning sensation in your throat.  If your symptoms do not improve in the next 3 days please follow-up with your primary care provider for reevaluation.    I have given you a prescription for steroids today.  Some common side effects include feelings of extra energy, feeling warm, increased appetite, and stomach upset.  If you are diabetic your sugars may run higher than usual.   Get help right away if you: Cough up blood. Feel pain in your chest. Have severe shortness of breath. Faint or keep feeling like you are going to faint. Have a severe headache. Have fever or chills that get worse. You have a severe headache. You have persistent vomiting. You have severe pain or swelling around your face or eyes. You have vision problems. You develop confusion. Your neck is stiff.

## 2021-02-08 NOTE — ED Provider Notes (Signed)
[facility] DEPT Provider Note   CSN: [id number] Arrival date & time: 02/08/21  0459     History Chief Complaint  Patient presents with  . Cough  . Nasal Congestion    Penny [name] is a 40 y.o. female with history of asthma, migraines, anxiety, depression.  Patient presents with complaint of rhinorrhea and nasal congestion.  Patient reports that her symptoms started on Thursday, 02/04/2021.  Patient reports symptoms have been progressively worsening.  Describes mucus as yellow in coloration.  Patient reports cough at baseline due to her asthma however states it has been worse over the last 3 to 4 days.  Patient reports cough uses minimal amounts of yellow mucus.  Patient reports that she is vomiting mucus.  She has vomited 6-7 times in the last 24 hours.  Patient reports that due to her vomiting she feels a burning sensation in her throat.  Patient denies any bloody emesis or coffee-ground emesis.  Patient also endorses sinus pressure.    Patient denies any fevers, chills, shortness of breath, chest pain, leg swelling, palpitations, abdominal pain, nausea, constipation, diarrhea.    Patient has been taking all of her prescribed medications for asthma.  Patient denies any surgery last 12 weeks, cancer treatment, hemoptysis, unilateral leg swelling or tenderness, prolonged immobilization, hormone therapy.  HPI     Past Medical History:  Diagnosis Date  . ADHD (attention deficit hyperactivity disorder)   . Anxiety    no meds  . Asthma   . Chronic back pain   . Depression    no meds  . Essential hypertension    a. Dx 06/2016.  . Migraines    last one over 1 month ago  . Overweight   . Palpitations - Sinus Tachycardia    a. 12/2016 Echo: E f55-60%, no rwma, nl LA/RA sizes.  . Placenta previa    w/ 2 pregnancies.  . Pre-diabetes   . Preterm labor    c/s at 36 wks  . Scoliosis   . Sinus tachycardia     Patient Active Problem List    Diagnosis Date Noted  . Moderate persistent asthma with acute exacerbation 08/01/2020  . Uterine fibroid 01/23/2016  . Right groin pain 01/23/2016  . History of C-section 01/23/2016  . Migraines   . Anxiety   . Depression     Past Surgical History:  Procedure Laterality Date  . CESAREAN SECTION  2010   x 1 at 36 wks in [place]  . CESAREAN SECTION  February 13, 2012  . CHROMOPERTUBATION  10/26/2015   Procedure: ATTEMPTED CHROMOPERTUBATION;  Surgeon: Crawford Givens, MD;  Location: [facility];  Service: Gynecology;;  . LAPAROSCOPIC LYSIS OF ADHESIONS  10/26/2015   Procedure: LAPAROSCOPIC LYSIS OF ADHESIONS;  Surgeon: Crawford Givens, MD;  Location: [facility];  Service: Gynecology;;  . LAPAROSCOPY N/A 10/26/2015   Procedure: LAPAROSCOPY OPERATIVE WITH REMOVAL OF MISPLACED FILSHE CLIP;  Surgeon: Crawford Givens, MD;  Location: [facility];  Service: Gynecology;  Laterality: N/A;  . SVD  2003   x 1 in [place]  . TUBAL LIGATION    . WISDOM TOOTH EXTRACTION       OB History    Gravida  3   Para  3   Term  1   Preterm  2   AB  0   Living  3     SAB  0   IAB  0   Ectopic  0   Multiple  0  Live Births  3           Family History  Problem Relation Age of Onset  . Cancer Mother   . CAD Mother   . Hypertension Sister   . Sickle cell trait Daughter   . Asthma Son   . Asthma Paternal Grandmother   . Pseudochol deficiency Neg Hx   . Malignant hyperthermia Neg Hx   . Anesthesia problems Neg Hx   . Hypotension Neg Hx     Social History   Tobacco Use  . Smoking status: Current Every Day Smoker    Packs/day: 0.10    Years: 18.00    Pack years: 1.80    Types: Cigarettes  . Smokeless tobacco: Never Used  Vaping Use  . Vaping Use: Former  Substance Use Topics  . Alcohol use: Yes    Comment: Rare   . Drug use: Not Currently    Types: Marijuana    Home Medications Prior to Admission medications   Medication Sig Start Date End Date Taking? Authorizing Provider  ACCU-CHEK  GUIDE test strip USE 1 strip 2 TIMES DAILY BEFORE MEALS 04/23/20   [provider]  Accu-Chek Softclix Lancets lancets 2 (two) times daily. 04/24/20   [provider]  albuterol (PROVENTIL) (2.5 MG/3ML) 0.083% nebulizer solution Take 3 mLs (2.5 mg total) by nebulization every 6 (six) hours as needed for wheezing or shortness of breath. 08/14/20   Vanessa Kick, MD  benzonatate (TESSALON PERLES) 100 MG capsule Take 1 capsule (100 mg total) by mouth 2 (two) times daily as needed for cough. 05/27/20 05/27/21  Rudolpho Sevin, NP  Blood Glucose Monitoring Suppl (ACCU-CHEK GUIDE) w/Device KIT See admin instructions. 04/23/20   [provider]  budesonide-formoterol (SYMBICORT) 160-4.5 MCG/ACT inhaler Inhale 2 puffs into the lungs in the morning and at bedtime. 08/01/20   Faustino Congress, NP  EPINEPHrine 0.3 mg/0.3 mL IJ SOAJ injection SMARTSIG:1 Syringe(s) IM PRN 01/14/20   [provider]  EPINEPHrine 0.3 mg/0.3 mL IJ SOAJ injection Inject 0.3 mg into the muscle as needed for anaphylaxis. As needed for life-threatening allergic reactions 08/27/20   Valentina Shaggy, MD  esomeprazole (NEXIUM) 40 MG capsule Take 1 capsule (40 mg total) by mouth daily. 08/14/20   Vanessa Kick, MD  fluticasone (FLOVENT HFA) 220 MCG/ACT inhaler Inhale 2 puffs into the lungs in the morning and at bedtime. 10/15/20   Valentina Shaggy, MD  gabapentin (NEURONTIN) 800 MG tablet Take 800 mg by mouth 3 (three) times daily.    [provider]  ipratropium (ATROVENT) 0.03 % nasal spray Place 2 sprays into both nostrils every 12 (twelve) hours. 05/28/20   Lamptey, Myrene Galas, MD  levocetirizine (XYZAL) 5 MG tablet Take 1 tablet (5 mg total) by mouth every evening. 07/29/20   Volney American, PA-C  levonorgestrel (MIRENA, 52 MG,) 20 MCG/24HR IUD Mirena 20 mcg/24 hours (7 yrs) 52 mg intrauterine device  Take by intrauterine route.    [provider]  metoprolol succinate  (TOPROL-XL) 25 MG 24 hr tablet Take 25 mg by mouth 2 (two) times daily. 07/29/20   [provider]  montelukast (SINGULAIR) 10 MG tablet Take 1 tablet (10 mg total) by mouth at bedtime. 07/29/20   Volney American, PA-C  nicotine (NICODERM CQ - DOSED IN MG/24 HOURS) 14 mg/24hr patch Place 1 patch (14 mg total) onto the skin daily. 08/27/20   Valentina Shaggy, MD  Olopatadine HCl (PATADAY) 0.2 % SOLN Place 1  drop into both eyes daily as needed. 09/25/20   Valentina Shaggy, MD  promethazine (PHENERGAN) 12.5 MG tablet Take 1 tablet (12.5 mg total) by mouth every 6 (six) hours as needed for nausea or vomiting. 07/29/20   Volney American, PA-C  promethazine (PHENERGAN) 25 MG tablet Take 1 tablet (25 mg total) by mouth every 6 (six) hours as needed for up to 3 days for nausea or vomiting. 06/13/20 06/16/20  Konrad Felix, PA-C  tiZANidine (ZANAFLEX) 4 MG tablet Take 4 mg by mouth every 6 (six) hours as needed for muscle spasms.    [provider]  XOPENEX HFA 45 MCG/ACT inhaler Inhale 2 puffs into the lungs every 4 (four) hours as needed for wheezing. 09/01/20   Valentina Shaggy, MD  Cetirizine HCl 10 MG CAPS Take 1 capsule (10 mg total) by mouth daily for 10 days. Patient not taking: Reported on 06/22/2019 02/11/19 08/18/19  Wieters, Elesa Hacker, PA-C  Dextromethorphan HBr 10 MG/5ML SYRP 10 ml qhs prn cough at night time. Patient not taking: Reported on 06/22/2019 02/04/19 08/18/19  Rodriguez-Southworth, Sunday Spillers, PA-C  fluticasone [facility]) 50 MCG/ACT nasal spray Place 1 spray into both nostrils daily. 05/27/20 05/28/20  Rudolpho Sevin, NP  norethindrone (AYGESTIN) 5 MG tablet Take by mouth. 05/07/20 05/28/20  [provider]  Topiramate ER (TROKENDI XR) 200 MG CP24 Take 1 capsule by mouth daily. 08/27/19 05/28/20  [provider]    Allergies    Other, Shrimp extract allergy skin test, and Shrimp [shellfish allergy]  Review of Systems   Review of Systems   Constitutional: Negative for chills and fever.  HENT: Positive for congestion and rhinorrhea. Negative for sore throat and trouble swallowing.   Eyes: Negative for visual disturbance.  Respiratory: Positive for cough. Negative for shortness of breath.   Cardiovascular: Negative for chest pain, palpitations and leg swelling.  Gastrointestinal: Positive for vomiting. Negative for abdominal distention, abdominal pain, blood in stool, constipation, diarrhea and nausea.  Genitourinary: Negative for difficulty urinating, dysuria, frequency and hematuria.  Musculoskeletal: Negative for back pain and neck pain.  Skin: Negative for color change and rash.  Neurological: Negative for dizziness, syncope, light-headedness and headaches.  Psychiatric/Behavioral: Negative for confusion.    Physical Exam Updated Vital Signs BP 131/89   Pulse 100   Temp 98.3 F (36.8 C) (Oral)   Resp 13   Ht 5' 4" (1.626 m)   Wt 93 kg   SpO2 96%   BMI 35.19 kg/m   Physical Exam Vitals and nursing note reviewed.  Constitutional:      General: She is not in acute distress.    Appearance: She is obese. She is not ill-appearing, toxic-appearing or diaphoretic.  HENT:     Head: Normocephalic.     Jaw: No trismus or pain on movement.     Nose: Congestion present.     Right Nostril: No foreign body, epistaxis, septal hematoma or occlusion.     Left Nostril: No foreign body, epistaxis, septal hematoma or occlusion.     Right Turbinates: Not enlarged, swollen or pale.     Left Turbinates: Not enlarged, swollen or pale.     Right Sinus: Maxillary sinus tenderness and frontal sinus tenderness present.     Left Sinus: Maxillary sinus tenderness and frontal sinus tenderness present.     Mouth/Throat:     Pharynx: Oropharynx is clear. Uvula midline. No pharyngeal swelling, oropharyngeal exudate, posterior oropharyngeal erythema or uvula swelling.     Tonsils:  No tonsillar exudate or tonsillar abscesses. 1+ on the  right. 1+ on the left.  Eyes:     General: No scleral icterus.       Right eye: No discharge.        Left eye: No discharge.  Cardiovascular:     Rate and Rhythm: Normal rate.     Heart sounds: Normal heart sounds.  Pulmonary:     Effort: Pulmonary effort is normal. No tachypnea, bradypnea or respiratory distress.     Breath sounds: Normal breath sounds. No stridor. No decreased breath sounds, wheezing, rhonchi or rales.     Comments: Patient able to speak in full complete sentences without difficulty Musculoskeletal:     Cervical back: Neck supple.     Right lower leg: Normal. No tenderness or bony tenderness. No edema.     Left lower leg: Normal. No tenderness or bony tenderness. No edema.  Skin:    General: Skin is warm and dry.  Neurological:     General: No focal deficit present.     Mental Status: She is alert.     GCS: GCS eye subscore is 4. GCS verbal subscore is 5. GCS motor subscore is 6.  Psychiatric:        Behavior: Behavior is cooperative.     ED Results / Procedures / Treatments   Labs (all labs ordered are listed, but only abnormal results are displayed) Labs Reviewed - No data to display  EKG None  Radiology DG Chest [facility] 1 View  Result Date: 02/08/2021 CLINICAL DATA:  New onset cough, dyspnea and congestion for 3 days EXAM: PORTABLE CHEST 1 VIEW COMPARISON:  08/14/2020 chest radiograph. FINDINGS: Stable cardiomediastinal silhouette with mild cardiomegaly. No pneumothorax. No pleural effusion. Lungs appear clear, with no acute consolidative airspace disease and no pulmonary edema. IMPRESSION: Stable mild cardiomegaly without pulmonary edema. No active pulmonary disease. Electronically Signed   By: Ilona Sorrel M.D.   On: 02/08/2021 07:24    Procedures Procedures   Medications Ordered in ED Medications  alum & mag hydroxide-simeth (MAALOX/MYLANTA) 200-200-20 MG/5ML suspension 30 mL (30 mLs Oral Given 02/08/21 0649)    And  lidocaine (XYLOCAINE) 2 %  viscous mouth solution 15 mL (15 mLs Oral Given 02/08/21 3354)    ED Course  I have reviewed the triage vital signs and the nursing notes.  Pertinent labs & imaging results that were available during my care of the patient were reviewed by me and considered in my medical decision making (see chart for details).    MDM Rules/Calculators/A&P                          Alert 40 year old female in no acute distress, nontoxic-appearing.  Patient presents with chief complaint of nasal congestion, rhinorrhea, and cough.  Will obtain a chest x-ray to evaluate for possible pneumonia however low suspicion due to patient being afebrile and lungs clear to auscultation bilaterally.    Patient was noted to have tachycardia rate of 108 upon arrival emergency department however during interview and all subsequent assessment heart rate noted to be persistently in the 90s.  Low suspicion for PE as patient to be ruled out via [facility] criteria.  Refused COVID-19 testing despite various attempts at explaining need for testing.  Patient was advised to self isolate for the next 5 days as she had refused testing and cannot rule out COVID-19.  Concern for bronchitis versus sinus infection.  Patient requests Z-Pak  for her symptoms.  Explained to patient that bronchitis is most often due to viral infection and therefore does not warrant antibiotic treatment.  Antibiotics not warranted for sinus infection as patient has not had double sickening or infection for greater than 7 days.    We will give patient prednisone x5 days for possible bronchitis.  We will also prescribe Mylanta for patient's complaint of indigestion.  Patient advised to follow-up with her primary care provider in 3 days if her symptoms have not improved.  Patient given strict return precautions.  Patient expressed understanding with all instructions and is agreeable with this plan.   Final Clinical Impression(s) / ED Diagnoses Final diagnoses:  Bronchitis   Acute non-recurrent maxillary sinusitis    Rx / DC Orders ED Discharge Orders         Ordered    predniSONE (DELTASONE) 20 MG tablet  Daily with breakfast        02/08/21 0753    alum & mag hydroxide-simeth [facility] MAXIMUM STRENGTH) 400-400-40 MG/5ML suspension  Every 6 hours PRN        02/08/21 0753           Loni Beckwith, PA-C 02/08/21 1942    Little, Wenda Overland, MD 02/10/21 [phone]

## 2021-02-08 NOTE — ED Notes (Signed)
Agree with triage note. Vitals taken. A&Ox4. NAD. Connected to cardiac monitor, BP and pulse ox. Stretcher low, wheels locked, call bell within reach. Pt ambulatory with steady gait to bathroom. Awaiting physician.

## 2021-02-08 NOTE — Telephone Encounter (Signed)
I will make follow up call to the patient later this afternoon or in the morning.

## 2021-02-09 ENCOUNTER — Ambulatory Visit: Payer: Self-pay

## 2021-02-09 NOTE — Telephone Encounter (Signed)
Called and left  voicemail message for patient that I was calling to see how she was feeling.  Left message to call the office for update or I would check on her later in the week.

## 2021-02-09 NOTE — Telephone Encounter (Addendum)
Patient returned call to the office.  Patient did televisit with her primary care provider yesterday and was started on Azithromycin.  Patient was already given Prednisone 40 mg and Tessalon Perles by provider in emergency room for bronchitis and sinusitis.  Patient is taking Symbicort daily and using her nebulizer every 4 hours.  She does have her Albuterol inhaler to use, but states holding her breath for 10 seconds is difficult at this time and the nebulizer is easier.  Patient does not have fever today.  Patient has been coughing and short of breath on occasion and states the nebulizer treatment helped.  Patient will continue to take her medications as directed by primary care and provider in emergency room.  Patient will rest and has soups, broths, juice and liquids to take.  Patient will follow up with primary care or call our office if she worsens or does not improve after taking antibiotic and Prednisone.  Paitent voiced understanding.

## 2021-02-15 ENCOUNTER — Ambulatory Visit (INDEPENDENT_AMBULATORY_CARE_PROVIDER_SITE_OTHER): Payer: Medicaid Other

## 2021-02-15 ENCOUNTER — Other Ambulatory Visit: Payer: Self-pay

## 2021-02-15 DIAGNOSIS — J4540 Moderate persistent asthma, uncomplicated: Secondary | ICD-10-CM

## 2021-02-17 ENCOUNTER — Ambulatory Visit: Payer: Self-pay

## 2021-02-24 ENCOUNTER — Ambulatory Visit: Payer: Medicaid Other | Admitting: Physical Therapy

## 2021-02-24 ENCOUNTER — Other Ambulatory Visit: Payer: Self-pay

## 2021-02-24 NOTE — [facility] Nurse Program (Signed)
Dept: [facility] Nurse Program Note  Date of Encounter: 02/23/2021 Met client for first time today at the [facility].  Client clearly upset and tearful as she states that both of her dogs are missing from the house where she left them.  Nurse introduced herself and asked client to sit down After client calmed down, nurse suggested that she contact the [employer] organization and provided her the number. Client had indicated that both dogs were registered and had a chip.  Once client found the chip numbers she contacted the agency.  They gathered information needed and instructed client to notify local veterinary's as well.  Client had already checked at the animal shelter and dogs were not there. Client did feel better after speaking with agency.  Nurse proceeded to collect a medical history for which client only reported history of anxiety, depression, ADHD and Insomnia, however when client showed nurse all of medications she had HCTZ 12.5 mg she stated was for blood pressure.  Since client was feeling very stressed" nurse asked to check her BP-122/82.  Client reports that she does have a monitor and checks it herself as well. Client's primary care provider is Dr. Ouida Sills with [facility]. Client has had 2 COVID vaccines.  She takes the following medications: Esomeprazole Mag DR 75m 1 QD Tizamidine HCL 437mQ 8hr prn Famotidine 4034m QHS Levocetirizine 5mg3mQD Diclofenac SOD DR 75mg66mID (10 days) Montelukast SOD 10mg 76m Levalbuterol 45mcg 15mler Flovent HFA 220mcg S68mcort 160/4.5  Client reports that one of her daughters who is present Chloe FaDion Bodyhas had two Covid vaccines and that every since then she started having fainting spells that would last 2-3 minutes, the longest one lasted 15 minutes. [place], has also been seen by a neurologist and a cardiologist. Chloe  c[employer] playing and doing an activity with an intern. Plan: Client has contacted her therapist who she sees once a week and will be meeting with her later this evening.  Nurse will follow up with client as needed net week.  Marialy Urbanczyk D. Marri Mcneff MSNEl [place] [phone] History: Past Medical History:  Diagnosis Date  . ADHD (attention deficit hyperactivity disorder)   . Anxiety    no meds  . Asthma   . Chronic back pain   . Depression    no meds  . Essential hypertension    a. Dx 06/2016.  . Migraines    last one over 1 month ago  . Overweight   . Palpitations - Sinus Tachycardia    a. 12/2016 Echo: E f55-60%, no rwma, nl LA/RA sizes.  . Placenta previa    w/ 2 pregnancies.  . Pre-diabetes   . Preterm labor    c/s at 36 wks  . Scoliosis   . Sinus tachycardia     Encounter Details:  CNP Questionnaire - 02/23/21 1544      Questionnaire   Do you give verbal consent to treat you today? Yes    Visit Setting Other    [facility]    [facility] Yes    Insurance Medicaid    Intervention Advocate;Educate    Housing/Utilities No permanent housing    Transportation --   Client has transportation   [facility] feels safe here at shelter  Food --   Client has no food insecurities   Medication --   Client has no medication insecurities   Referrals Other   Referred to [employer] to help with locating her lost dogs.   Screening Referrals --   No referrals needed   ED Visit Averted Yes    Life-Saving Intervention Made Yes

## 2021-03-02 ENCOUNTER — Encounter: Payer: Medicaid Other | Attending: Nurse Practitioner | Admitting: Physical Therapy

## 2021-03-02 ENCOUNTER — Encounter: Payer: Self-pay | Admitting: Physical Therapy

## 2021-03-02 ENCOUNTER — Other Ambulatory Visit: Payer: Self-pay

## 2021-03-02 DIAGNOSIS — R252 Cramp and spasm: Secondary | ICD-10-CM

## 2021-03-02 DIAGNOSIS — R102 Pelvic and perineal pain: Secondary | ICD-10-CM

## 2021-03-02 DIAGNOSIS — R1084 Generalized abdominal pain: Secondary | ICD-10-CM | POA: Insufficient documentation

## 2021-03-02 DIAGNOSIS — M6281 Muscle weakness (generalized): Secondary | ICD-10-CM

## 2021-03-02 NOTE — Therapy (Signed)
[place] at [facility] [address] [place], [place], [address] Phone: [phone]   Fax:  [phone]  Physical Therapy Evaluation  Patient Details  Name: Penny [name] MRN: [id number] Date of Birth: [birth year] Referring Provider (PT): Claris Gladden. Ouida Sills, NP   Encounter Date: 03/02/2021   PT End of Session - 03/02/21 1127    Visit Number 1    Date for PT Re-Evaluation 05/25/21    Authorization Type Medicaid    PT Start Time 1037    PT Stop Time 1120    PT Time Calculation (min) 43 min    Activity Tolerance Patient tolerated treatment well;No increased pain    Behavior During Therapy WFL for tasks assessed/performed           Past Medical History:  Diagnosis Date  . ADHD (attention deficit hyperactivity disorder)   . Anxiety    no meds  . Asthma   . Chronic back pain   . Depression    no meds  . Essential hypertension    a. Dx 06/2016.  . Migraines    last one over 1 month ago  . Overweight   . Palpitations - Sinus Tachycardia    a. 12/2016 Echo: E f55-60%, no rwma, nl LA/RA sizes.  . Placenta previa    w/ 2 pregnancies.  . Pre-diabetes   . Preterm labor    c/s at 36 wks  . Scoliosis   . Sinus tachycardia     Past Surgical History:  Procedure Laterality Date  . CESAREAN SECTION  2010   x 1 at 36 wks in [place]  . CESAREAN SECTION  February 13, 2012  . CHROMOPERTUBATION  10/26/2015   Procedure: ATTEMPTED CHROMOPERTUBATION;  Surgeon: Crawford Givens, MD;  Location: [facility];  Service: Gynecology;;  . LAPAROSCOPIC LYSIS OF ADHESIONS  10/26/2015   Procedure: LAPAROSCOPIC LYSIS OF ADHESIONS;  Surgeon: Crawford Givens, MD;  Location: [facility];  Service: Gynecology;;  . LAPAROSCOPY N/A 10/26/2015   Procedure: LAPAROSCOPY OPERATIVE WITH REMOVAL OF MISPLACED FILSHE CLIP;  Surgeon: Crawford Givens, MD;  Location: [facility];  Service: Gynecology;  Laterality: N/A;  . SVD  2003   x 1 in [place]  . TUBAL LIGATION    . WISDOM  TOOTH EXTRACTION      There were no vitals filed for this visit.    Subjective Assessment - 03/02/21 1041    Subjective Patietn continues to have pain  int the right groin and left pelvis. Patinet has pain in left lumbar.    Patient Stated Goals reduce her pain    Currently in Pain? Yes    Pain Score 6     Pain Location Abdomen    Pain Orientation Right    Pain Descriptors / Indicators Throbbing;Aching    Pain Type Chronic pain    Pain Onset More than a month ago    Pain Frequency Intermittent    Aggravating Factors  sex; laying on the right side; presing on the area; walking a distance    Pain Relieving Factors heat water; heat; muscle relaxer; pain medication    Multiple Pain Sites No              [facility] PT Assessment - 03/02/21 0001      Assessment   Medical Diagnosis R10.2 pelvic and perineal pain    Referring Provider (PT) Claris Gladden. Ouida Sills, NP    Onset Date/Surgical Date 11/05/20    Prior Therapy had pelvic floor PT  in the past      Precautions   Precautions None      Restrictions   Weight Bearing Restrictions No      Balance Screen   Has the patient fallen in the past 6 months No    Has the patient had a decrease in activity level because of a fear of falling?  No    Is the patient reluctant to leave their home because of a fear of falling?  No      Home Ecologist residence      Prior Function   Level of Independence Independent    Leisure walking daily      Cognition   Overall Cognitive Status Within Functional Limits for tasks assessed      Posture/Postural Control   Posture/Postural Control Postural limitations    Posture Comments rib hump on the upper right thoracic      ROM / Strength   AROM / PROM / Strength AROM;PROM;Strength      AROM   Lumbar Flexion decreased by 25% with pain in the sacrum    Lumbar Extension decreased by 25% with pain    Lumbar - Right Rotation decreased by 25%    Lumbar - Left Rotation  decreased by 25%      Strength   Right Hip ABduction 3+/5    Right Hip ADduction 3+/5    Left Hip ABduction 3+/5    Left Hip ADduction 3+/5      Palpation   SI assessment  left ilium is higher than right and both ASIS are tender    Palpation comment lower abdominal is very tender, left hip adductor                      Objective measurements completed on examination: See above findings.     Pelvic Floor Special Questions - 03/02/21 0001    Currently Sexually Active Yes    Is this Painful Yes    Marinoff Scale pain interrupts completion    Urinary Leakage No    Urinary frequency after drinking    Fluid intake water, juice, sodas, lemonsade, apple juice, orange juice    Skin Integrity Intact    Pelvic Floor Internal Exam Patient confirms identification and approves PT to assess pelvic floor and treatment    Exam Type Vaginal    Palpation tenderness and restrictions felt in the posterior portion ot the iliococcygeus, right  and anterior side of the cervix and left anterior vaginal wall that produced lower abdominal pain    Strength weak squeeze, no lift   very faint lift           [facility] Adult PT Treatment/Exercise - 03/02/21 0001      Manual Therapy   Manual Therapy Internal Pelvic Floor    Internal Pelvic Floor manual mobilization of the posterior iliococcygeus; release of the right anterior cervix that produced anterior lower abdominal pain; release of the left anterio vaginal wall that produced the left lwoer abdominal pain and left leg pain   after the release the patient had reduction of the lower abdominal and left leg pain                   PT Short Term Goals - 03/02/21 1423      PT SHORT TERM GOAL #1   Title pt to be I with intial HEP     Baseline not educated yet  Time 4    Period Weeks    Status New    Target Date 03/30/21             PT Long Term Goals - 03/02/21 1424      PT LONG TERM GOAL #1   Title independent with  advanced HEP    Baseline not educated yet    Time 12    Period Weeks    Status New    Target Date 05/25/21      PT LONG TERM GOAL #2   Title understand ways to manage pain with meditation, breathing, ways to think of pain    Baseline not educated yet    Time 12    Period Weeks    Status New    Target Date 05/25/21      PT LONG TERM GOAL #3   Title able to brace her abdomen during daily tasks to reduce the pain and improve her function with abdominal pain not higher than 1-2/10    Baseline pain level 6/10    Time 12    Period Weeks    Status New    Target Date 05/25/21      PT LONG TERM GOAL #4   Title able to start a walking program for 15-30 mintues a day due to pain decrease </= 3/10    Baseline pain level 3/10    Time 12    Period Weeks    Status New    Target Date 05/25/21      PT LONG TERM GOAL #5   Title Marinoff score </= 1/3 and pain with penile penetration vaginally decreased to </= 2/10 due to reduction of her trigger points    Baseline Marinoff is 2/3 and pain level is 6/10    Time 12    Period Weeks    Status New    Target Date 05/25/21                  Plan - 03/02/21 1414    Clinical Impression Statement Patient is a 40 year old female with chronic pelvic and lower abdominal pain and had a recurrent episode on 11/05/2021 after she ahd therapy last year. Patient reports her abdominal and intermittent pelvic pain is at level 6/10 with penile penetration vaginally, laying on right side, pressing on the area, and walking long distances. Marinoff score is 2/3. She stands with a rib hump on the right thoracic. Lumbar ROM is decreased by 25%. She has pain in the sacrum with lumbar flexion. Bilateral hip abduction and adduction strength is 3+/5. Left ilium is higher with both ASIS are equal. Pelvic strength is 2/5. Patient has tenderness located in the lower abdominal area, left hip adduction, and distal iliococcygeus. The right side of her cervix is restricted  and causes pain into her right lower abdominal area.  She has restrictions on the anterior left vaginal wall that refers pain into the left lower abdominal area and pain down left leg. Patient will benefit from skilled therapy to improve tissue mobility, reduce pain and improve strength to return to functional level.    Personal Factors and Comorbidities Comorbidity 2;Sex;Social Background;Fitness;Time since onset of injury/illness/exacerbation    Comorbidities C-section 2x; ADHD    Examination-Activity Limitations Lift;Locomotion Level    Examination-Participation Restrictions Interpersonal Relationship    Stability/Clinical Decision Making Stable/Uncomplicated    Clinical Decision Making Low    Rehab Potential Good    PT Frequency 1x / week  PT Duration 12 weeks    PT Treatment/Interventions Biofeedback;Cryotherapy;Electrical Stimulation;Iontophoresis 4mg /ml Dexamethasone;Moist Heat;Ultrasound;Therapeutic activities;Therapeutic exercise;Manual techniques;Patient/family education;Neuromuscular re-education;Dry needling;Spinal Manipulations    PT Next Visit Plan internal work to the cervix and left side of the vaginal canal; hip stretches; core engagement; hip strength    Consulted and Agree with Plan of Care Patient           Patient will benefit from skilled therapeutic intervention in order to improve the following deficits and impairments:  Decreased activity tolerance,Decreased strength,Increased fascial restricitons,Pain,Decreased mobility,Increased muscle spasms,Decreased range of motion  Visit Diagnosis: Muscle weakness (generalized) - Plan: PT plan of care cert/re-cert  Cramp and spasm - Plan: PT plan of care cert/re-cert  Pelvic pain - Plan: PT plan of care cert/re-cert     Problem List Patient Active Problem List   Diagnosis Date Noted  . Moderate persistent asthma with acute exacerbation 08/01/2020  . Uterine fibroid 01/23/2016  . Right groin pain 01/23/2016  .  History of C-section 01/23/2016  . Migraines   . Anxiety   . Depression     Earlie Counts, PT 03/02/21 2:29 PM   [facility] [address] [place], [place], [address] Phone: [phone]   Fax:  [phone]  Name: Penny [name] MRN: [id number] Date of Birth: June 25, 1981

## 2021-03-03 ENCOUNTER — Ambulatory Visit (INDEPENDENT_AMBULATORY_CARE_PROVIDER_SITE_OTHER): Payer: Medicaid Other | Admitting: Allergy

## 2021-03-03 DIAGNOSIS — J4540 Moderate persistent asthma, uncomplicated: Secondary | ICD-10-CM | POA: Diagnosis not present

## 2021-03-09 ENCOUNTER — Encounter: Payer: Medicaid Other | Admitting: Physical Therapy

## 2021-03-09 ENCOUNTER — Encounter: Payer: Self-pay | Admitting: Physical Therapy

## 2021-03-09 ENCOUNTER — Other Ambulatory Visit: Payer: Self-pay

## 2021-03-09 DIAGNOSIS — M6281 Muscle weakness (generalized): Secondary | ICD-10-CM | POA: Diagnosis not present

## 2021-03-09 DIAGNOSIS — R1084 Generalized abdominal pain: Secondary | ICD-10-CM

## 2021-03-09 DIAGNOSIS — R252 Cramp and spasm: Secondary | ICD-10-CM

## 2021-03-09 DIAGNOSIS — R102 Pelvic and perineal pain: Secondary | ICD-10-CM

## 2021-03-09 NOTE — Therapy (Signed)
[place] at [facility] [address] [place], [place], [address] Phone: [phone]   Fax:  [phone]  Physical Therapy Treatment  Patient Details  Name: Penny [name] MRN: [id number] Date of Birth: [birth year] Referring Provider (PT): Claris Gladden. Ouida Sills, NP   Encounter Date: 03/09/2021   PT End of Session - 03/09/21 1308    Visit Number 2    Date for PT Re-Evaluation 05/25/21    Authorization Type Medicaid    Authorization Time Period 4/26-5/16    Authorization - Visit Number 1    Authorization - Number of Visits 3    PT Start Time 1300    PT Stop Time 1340    PT Time Calculation (min) 40 min    Activity Tolerance Patient tolerated treatment well;No increased pain    Behavior During Therapy WFL for tasks assessed/performed           Past Medical History:  Diagnosis Date  . ADHD (attention deficit hyperactivity disorder)   . Anxiety    no meds  . Asthma   . Chronic back pain   . Depression    no meds  . Essential hypertension    a. Dx 06/2016.  . Migraines    last one over 1 month ago  . Overweight   . Palpitations - Sinus Tachycardia    a. 12/2016 Echo: E f55-60%, no rwma, nl LA/RA sizes.  . Placenta previa    w/ 2 pregnancies.  . Pre-diabetes   . Preterm labor    c/s at 36 wks  . Scoliosis   . Sinus tachycardia     Past Surgical History:  Procedure Laterality Date  . CESAREAN SECTION  2010   x 1 at 36 wks in [place]  . CESAREAN SECTION  February 13, 2012  . CHROMOPERTUBATION  10/26/2015   Procedure: ATTEMPTED CHROMOPERTUBATION;  Surgeon: Crawford Givens, MD;  Location: [facility];  Service: Gynecology;;  . LAPAROSCOPIC LYSIS OF ADHESIONS  10/26/2015   Procedure: LAPAROSCOPIC LYSIS OF ADHESIONS;  Surgeon: Crawford Givens, MD;  Location: [facility];  Service: Gynecology;;  . LAPAROSCOPY N/A 10/26/2015   Procedure: LAPAROSCOPY OPERATIVE WITH REMOVAL OF MISPLACED FILSHE CLIP;  Surgeon: Crawford Givens, MD;  Location:  [facility];  Service: Gynecology;  Laterality: N/A;  . SVD  2003   x 1 in [place]  . TUBAL LIGATION    . WISDOM TOOTH EXTRACTION      There were no vitals filed for this visit.   Subjective Assessment - 03/09/21 1304    Subjective My knee is not bothering me at all since last treatment. The pain on the righ tlower abdominal has reduced by 30%.    Patient Stated Goals reduce her pain    Currently in Pain? Yes    Pain Score 3     Pain Location Abdomen    Pain Orientation Right    Pain Descriptors / Indicators Sore    Pain Type Chronic pain    Pain Onset More than a month ago    Pain Frequency Intermittent    Aggravating Factors  walking with pain in right inguinal area;    Pain Relieving Factors heat    Multiple Pain Sites No                          Pelvic Floor Special Questions - 03/09/21 0001    Pelvic Floor Internal Exam Patient confirms identification and approves PT to  assess pelvic floor and treatment    Exam Type Vaginal    Strength fair squeeze, definite lift   weak lift            [facility] Adult PT Treatment/Exercise - 03/09/21 0001      Lumbar Exercises: Stretches   Piriformis Stretch Right;Left;1 rep;30 seconds    Piriformis Stretch Limitations supine pulling leg across body      Lumbar Exercises: Supine   Bridge 15 reps;1 second      Manual Therapy   Manual Therapy Joint mobilization;Soft tissue mobilization;Internal Pelvic Floor;Myofascial release    Joint Mobilization anterior glide, distraction, posterior glide to bilateral hips grade 3    Myofascial Release right leg pull to distract the joint and move through the fascial release    Internal Pelvic Floor fascial relesae throughout the pelvic floor with one finger internally and other on the lower abdominal area monitoring form pain and different sensations the patient may have.                  PT Education - 03/09/21 1348    Education Details Access Code: [email or web address]    Person(s)  Educated Patient    Methods Explanation;Demonstration;Verbal cues;Handout    Comprehension Returned demonstration;Verbalized understanding            PT Short Term Goals - 03/02/21 1423      PT SHORT TERM GOAL #1   Title pt to be I with intial HEP     Baseline not educated yet    Time 4    Period Weeks    Status New    Target Date 03/30/21             PT Long Term Goals - 03/02/21 1424      PT LONG TERM GOAL #1   Title independent with advanced HEP    Baseline not educated yet    Time 12    Period Weeks    Status New    Target Date 05/25/21      PT LONG TERM GOAL #2   Title understand ways to manage pain with meditation, breathing, ways to think of pain    Baseline not educated yet    Time 12    Period Weeks    Status New    Target Date 05/25/21      PT LONG TERM GOAL #3   Title able to brace her abdomen during daily tasks to reduce the pain and improve her function with abdominal pain not higher than 1-2/10    Baseline pain level 6/10    Time 12    Period Weeks    Status New    Target Date 05/25/21      PT LONG TERM GOAL #4   Title able to start a walking program for 15-30 mintues a day due to pain decrease </= 3/10    Baseline pain level 3/10    Time 12    Period Weeks    Status New    Target Date 05/25/21      PT LONG TERM GOAL #5   Title Marinoff score </= 1/3 and pain with penile penetration vaginally decreased to </= 2/10 due to reduction of her trigger points    Baseline Marinoff is 2/3 and pain level is 6/10    Time 12    Period Weeks    Status New    Target Date 05/25/21  Plan - 03/09/21 1351    Clinical Impression Statement Patiemt reports her right sided pain is 30% better and now intermittent with only soreness instead of constant sharp throbbing. Patient pelvic floor strength increased to 3/5 with a weak lift. After manual work her ASIS are equal. Patient was able to stand taller after therapy due to reduction of  anterior trunk tightness. . Patient will benefit from skilled therapy to improve tissue mobility, reduce pain and improve strength to return to functional level.    Personal Factors and Comorbidities Comorbidity 2;Sex;Social Background;Fitness;Time since onset of injury/illness/exacerbation    Comorbidities C-section 2x; ADHD    Examination-Activity Limitations Lift;Locomotion Level    Examination-Participation Restrictions Interpersonal Relationship    Stability/Clinical Decision Making Stable/Uncomplicated    Rehab Potential Good    PT Frequency 1x / week    PT Duration 12 weeks    PT Treatment/Interventions Biofeedback;Cryotherapy;Electrical Stimulation;Iontophoresis 4mg /ml Dexamethasone;Moist Heat;Ultrasound;Therapeutic activities;Therapeutic exercise;Manual techniques;Patient/family education;Neuromuscular re-education;Dry needling;Spinal Manipulations    PT Next Visit Plan internal work to the cervix and left side of the vaginal canal; hip flexor stretches; core engagement; hip strength especially the gluteals    PT Home Exercise Plan Access Code: [email or web address]    Consulted and Agree with Plan of Care Patient           Patient will benefit from skilled therapeutic intervention in order to improve the following deficits and impairments:  Decreased activity tolerance,Decreased strength,Increased fascial restricitons,Pain,Decreased mobility,Increased muscle spasms,Decreased range of motion  Visit Diagnosis: Muscle weakness (generalized)  Cramp and spasm  Pelvic pain  Generalized abdominal pain     Problem List Patient Active Problem List   Diagnosis Date Noted  . Moderate persistent asthma with acute exacerbation 08/01/2020  . Uterine fibroid 01/23/2016  . Right groin pain 01/23/2016  . History of C-section 01/23/2016  . Migraines   . Anxiety   . Depression     Earlie Counts, PT 03/09/21 1:56 PM   [facility] at [facility] [address] [place], [place], [address] Phone: [phone]   Fax:  [phone]  Name: NALEE LIGHTLE MRN: [id number] Date of Birth: [birth year]

## 2021-03-09 NOTE — Patient Instructions (Signed)
Access Code: [email or web address] URL: [email or web address] Date: 03/09/2021 Prepared by: Earlie Counts  Exercises Supine Bridge - 1 x daily - 7 x weekly - 1 sets - 10 reps Supine Piriformis Stretch with Leg Straight - 1 x daily - 7 x weekly - 1 sets - 2 reps - 30 sec hold Earlie Counts, PT [facility] [address] [place], [place] [address] W: [phone] [email or web address]

## 2021-03-10 ENCOUNTER — Ambulatory Visit: Payer: Medicaid Other | Admitting: Physical Therapy

## 2021-03-10 ENCOUNTER — Ambulatory Visit (HOSPITAL_COMMUNITY)
Admission: EM | Admit: 2021-03-10 | Discharge: 2021-03-10 | Disposition: A | Discharge status: Home / Self Care | Payer: Medicaid Other | Attending: Physician Assistant | Admitting: Physician Assistant

## 2021-03-10 ENCOUNTER — Encounter (HOSPITAL_COMMUNITY): Payer: Self-pay

## 2021-03-10 DIAGNOSIS — W57XXXA Bitten or stung by nonvenomous insect and other nonvenomous arthropods, initial encounter: Secondary | ICD-10-CM | POA: Diagnosis not present

## 2021-03-10 DIAGNOSIS — M542 Cervicalgia: Secondary | ICD-10-CM

## 2021-03-10 MED ORDER — HYDROCODONE-ACETAMINOPHEN 5-325 MG PO TABS: 1.0000 | ORAL_TABLET | Freq: Four times a day (QID) | ORAL | 0 refills | Status: DC | PRN

## 2021-03-10 NOTE — Discharge Instructions (Addendum)
I have given you 3 doses of hydrocodone to manage pain.  You should not drive or drink alcohol with this medication as drowsiness is a common side effect.  Start antibiotics as prescribed by your PCP.  If area continues to swell or you have worsening pain you need to be reevaluated.

## 2021-03-10 NOTE — ED Provider Notes (Signed)
[place]    CSN: [id number] Arrival date & time: 03/10/21  1700      History   Chief Complaint Chief Complaint  Patient presents with  . Otalgia    HPI Penny [name] is a 40 y.o. female.   Patient presents today with a 1 day history of pain inferior to left ear.  She did participate in video visit with her provider who prescribed amoxicillin which she has not yet started.  She denies any otalgia, otorrhea, fever, nasal congestion, recent illness.  Reports she was bitten by something on her left neck just prior to symptom onset and wonders if this could be contributing to symptoms.  Pain is rated 6 on a 0-10 pain scale, localized to inferior left ear with radiation to left temple and head, described as burning, no aggravating or alleviating factors identified.  Patient reports pain is severe and interfering with her ability perform daily activities.  She has previously been on oxycodone to manage pain but does not have any of this available and reports Tylenol and ibuprofen have been ineffective.  She denies any recent swimming, airplane travel, earbud/earplug use.  Patient wanted to ensure that it was appropriate to start antibiotics as prescribed by her PCP.     Past Medical History:  Diagnosis Date  . ADHD (attention deficit hyperactivity disorder)   . Anxiety    no meds  . Asthma   . Chronic back pain   . Depression    no meds  . Essential hypertension    a. Dx 06/2016.  . Migraines    last one over 1 month ago  . Overweight   . Palpitations - Sinus Tachycardia    a. 12/2016 Echo: E f55-60%, no rwma, nl LA/RA sizes.  . Placenta previa    w/ 2 pregnancies.  . Pre-diabetes   . Preterm labor    c/s at 36 wks  . Scoliosis   . Sinus tachycardia     Patient Active Problem List   Diagnosis Date Noted  . Moderate persistent asthma with acute exacerbation 08/01/2020  . Uterine fibroid 01/23/2016  . Right groin pain 01/23/2016  . History of C-section  01/23/2016  . Migraines   . Anxiety   . Depression     Past Surgical History:  Procedure Laterality Date  . CESAREAN SECTION  2010   x 1 at 36 wks in [place]  . CESAREAN SECTION  February 13, 2012  . CHROMOPERTUBATION  10/26/2015   Procedure: ATTEMPTED CHROMOPERTUBATION;  Surgeon: Crawford Givens, MD;  Location: [facility];  Service: Gynecology;;  . LAPAROSCOPIC LYSIS OF ADHESIONS  10/26/2015   Procedure: LAPAROSCOPIC LYSIS OF ADHESIONS;  Surgeon: Crawford Givens, MD;  Location: [facility];  Service: Gynecology;;  . LAPAROSCOPY N/A 10/26/2015   Procedure: LAPAROSCOPY OPERATIVE WITH REMOVAL OF MISPLACED FILSHE CLIP;  Surgeon: Crawford Givens, MD;  Location: [facility];  Service: Gynecology;  Laterality: N/A;  . SVD  2003   x 1 in [place]  . TUBAL LIGATION    . WISDOM TOOTH EXTRACTION      OB History    Gravida  3   Para  3   Term  1   Preterm  2   AB  0   Living  3     SAB  0   IAB  0   Ectopic  0   Multiple  0   Live Births  3  Home Medications    Prior to Admission medications   Medication Sig Start Date End Date Taking? Authorizing Provider  HYDROcodone-acetaminophen (NORCO/VICODIN) 5-325 MG tablet Take 1 tablet by mouth every 6 (six) hours as needed for up to 3 doses for severe pain. 03/10/21  Yes Brookelynn Hamor K, PA-C  ACCU-CHEK GUIDE test strip USE 1 strip 2 TIMES DAILY BEFORE MEALS 04/23/20   [provider]  Accu-Chek Softclix Lancets lancets 2 (two) times daily. 04/24/20   [provider]  albuterol (PROVENTIL) (2.5 MG/3ML) 0.083% nebulizer solution Take 3 mLs (2.5 mg total) by nebulization every 6 (six) hours as needed for wheezing or shortness of breath. 08/14/20   Vanessa Kick, MD  alum & mag hydroxide-simeth [facility] MAXIMUM STRENGTH) 400-400-40 MG/5ML suspension Take 10 mLs by mouth every 6 (six) hours as needed for indigestion. 02/08/21   Loni Beckwith, PA-C  benzonatate (TESSALON PERLES) 100 MG capsule Take 1 capsule (100 mg total) by  mouth 2 (two) times daily as needed for cough. 05/27/20 05/27/21  Rudolpho Sevin, NP  Blood Glucose Monitoring Suppl (ACCU-CHEK GUIDE) w/Device KIT See admin instructions. 04/23/20   [provider]  budesonide-formoterol (SYMBICORT) 160-4.5 MCG/ACT inhaler Inhale 2 puffs into the lungs in the morning and at bedtime. 08/01/20   Faustino Congress, NP  EPINEPHrine 0.3 mg/0.3 mL IJ SOAJ injection SMARTSIG:1 Syringe(s) IM PRN 01/14/20   [provider]  EPINEPHrine 0.3 mg/0.3 mL IJ SOAJ injection Inject 0.3 mg into the muscle as needed for anaphylaxis. As needed for life-threatening allergic reactions 08/27/20   Valentina Shaggy, MD  esomeprazole (NEXIUM) 40 MG capsule Take 1 capsule (40 mg total) by mouth daily. 08/14/20   Vanessa Kick, MD  fluticasone (FLOVENT HFA) 220 MCG/ACT inhaler Inhale 2 puffs into the lungs in the morning and at bedtime. 10/15/20   Valentina Shaggy, MD  gabapentin (NEURONTIN) 800 MG tablet Take 800 mg by mouth 3 (three) times daily.    [provider]  ipratropium (ATROVENT) 0.03 % nasal spray Place 2 sprays into both nostrils every 12 (twelve) hours. 05/28/20   Lamptey, Myrene Galas, MD  levocetirizine (XYZAL) 5 MG tablet Take 1 tablet (5 mg total) by mouth every evening. 07/29/20   Volney American, PA-C  levonorgestrel (MIRENA, 52 MG,) 20 MCG/24HR IUD Mirena 20 mcg/24 hours (7 yrs) 52 mg intrauterine device  Take by intrauterine route.    [provider]  metoprolol succinate (TOPROL-XL) 25 MG 24 hr tablet Take 25 mg by mouth 2 (two) times daily. 07/29/20   [provider]  montelukast (SINGULAIR) 10 MG tablet Take 1 tablet (10 mg total) by mouth at bedtime. 07/29/20   Volney American, PA-C  nicotine (NICODERM CQ - DOSED IN MG/24 HOURS) 14 mg/24hr patch Place 1 patch (14 mg total) onto the skin daily. 08/27/20   Valentina Shaggy, MD  Olopatadine HCl (PATADAY) 0.2 % SOLN Place 1 drop into both eyes daily as needed.  09/25/20   Valentina Shaggy, MD  promethazine (PHENERGAN) 12.5 MG tablet Take 1 tablet (12.5 mg total) by mouth every 6 (six) hours as needed for nausea or vomiting. 07/29/20   Volney American, PA-C  promethazine (PHENERGAN) 25 MG tablet Take 1 tablet (25 mg total) by mouth every 6 (six) hours as needed for up to 3 days for nausea or vomiting. 06/13/20 06/16/20  Konrad Felix, PA-C  tiZANidine (ZANAFLEX) 4 MG tablet Take 4 mg by mouth every 6 (six) hours as needed  for muscle spasms.    [provider]  XOPENEX HFA 45 MCG/ACT inhaler Inhale 2 puffs into the lungs every 4 (four) hours as needed for wheezing. 09/01/20   Valentina Shaggy, MD  Cetirizine HCl 10 MG CAPS Take 1 capsule (10 mg total) by mouth daily for 10 days. Patient not taking: Reported on 06/22/2019 02/11/19 08/18/19  Wieters, Elesa Hacker, PA-C  Dextromethorphan HBr 10 MG/5ML SYRP 10 ml qhs prn cough at night time. Patient not taking: Reported on 06/22/2019 02/04/19 08/18/19  Rodriguez-Southworth, Sunday Spillers, PA-C  fluticasone [facility]) 50 MCG/ACT nasal spray Place 1 spray into both nostrils daily. 05/27/20 05/28/20  Rudolpho Sevin, NP  norethindrone (AYGESTIN) 5 MG tablet Take by mouth. 05/07/20 05/28/20  [provider]  Topiramate ER (TROKENDI XR) 200 MG CP24 Take 1 capsule by mouth daily. 08/27/19 05/28/20  [provider]    Family History Family History  Problem Relation Age of Onset  . Cancer Mother   . CAD Mother   . Hypertension Sister   . Sickle cell trait Daughter   . Asthma Son   . Asthma Paternal Grandmother   . Pseudochol deficiency Neg Hx   . Malignant hyperthermia Neg Hx   . Anesthesia problems Neg Hx   . Hypotension Neg Hx     Social History Social History   Tobacco Use  . Smoking status: Current Every Day Smoker    Packs/day: 0.10    Years: 18.00    Pack years: 1.80    Types: Cigarettes  . Smokeless tobacco: Never Used  Vaping Use  . Vaping Use: Former  Substance Use  Topics  . Alcohol use: Yes    Comment: Rare   . Drug use: Not Currently    Types: Marijuana     Allergies   Other, Shrimp extract allergy skin test, and Shrimp [shellfish allergy]   Review of Systems Review of Systems  Constitutional: Negative for activity change, appetite change, fatigue and fever.  HENT: Negative for congestion, ear pain, sinus pressure, sneezing and sore throat.   Respiratory: Negative for cough and shortness of breath.   Gastrointestinal: Negative for abdominal pain, diarrhea, nausea and vomiting.  Musculoskeletal: Negative for arthralgias and myalgias.  Skin: Positive for wound.  Neurological: Positive for headaches. Negative for dizziness, weakness and light-headedness.     Physical Exam Triage Vital Signs ED Triage Vitals  Enc Vitals Group     BP 03/10/21 1001 110/60     Pulse Rate 03/10/21 1001 75     Resp 03/10/21 1001 19     Temp 03/10/21 1001 98.5 F (36.9 C)     Temp src --      SpO2 03/10/21 1001 98 %     Weight --      Height --      Head Circumference --      Peak Flow --      Pain Score 03/10/21 0959 6     Pain Loc --      Pain Edu? --      Excl. in [place]? --    No data found.  Updated Vital Signs BP 111/67 (BP Location: Right Arm)   Pulse 75   Temp 98.5 F (36.9 C)   Resp 19   LMP  (LMP Unknown)   SpO2 98%   Visual Acuity Right Eye Distance:   Left Eye Distance:   Bilateral Distance:    Right Eye Near:   Left Eye Near:    Bilateral  Near:     Physical Exam Vitals reviewed.  Constitutional:      General: She is awake. She is not in acute distress.    Appearance: Normal appearance. She is not ill-appearing.     Comments: Very pleasant female appears stated age in no acute distress  HENT:     Head: Normocephalic and atraumatic.      Right Ear: Tympanic membrane, ear canal and external ear normal. Tympanic membrane is not erythematous or bulging.     Left Ear: Tympanic membrane, ear canal and external ear normal.  Tympanic membrane is not erythematous or bulging.     Nose:     Right Sinus: No maxillary sinus tenderness or frontal sinus tenderness.     Left Sinus: No maxillary sinus tenderness or frontal sinus tenderness.     Mouth/Throat:     Pharynx: Uvula midline. No oropharyngeal exudate or posterior oropharyngeal erythema.  Cardiovascular:     Rate and Rhythm: Normal rate and regular rhythm.     Heart sounds: No murmur heard.   Pulmonary:     Effort: Pulmonary effort is normal.     Breath sounds: Normal breath sounds. No wheezing, rhonchi or rales.     Comments: Clear to auscultation bilaterally Lymphadenopathy:     Head:     Right side of head: No submental, submandibular or tonsillar adenopathy.     Left side of head: No submental, submandibular or tonsillar adenopathy.     Cervical: No cervical adenopathy.     Comments: No adenopathy noted.  Psychiatric:        Behavior: Behavior is cooperative.      UC Treatments / Results  Labs (all labs ordered are listed, but only abnormal results are displayed) Labs Reviewed - No data to display  EKG   Radiology No results found.  Procedures Procedures (including critical care time)  Medications Ordered in UC Medications - No data to display  Initial Impression / Assessment and Plan / UC Course  I have reviewed the triage vital signs and the nursing notes.  Pertinent labs & imaging results that were available during my care of the patient were reviewed by me and considered in my medical decision making (see chart for details).     No adenopathy noted on exam.  Concern for infected bite given clinical presentation.  Encourage patient to start antibiotics prescribed by PCP.  She reports pain is very severe and so was given 3 doses of hydrocodone.  Review of [place] controlled substance database shows no inappropriate refills.  Strict return precautions given to which patient expressed understanding.  Final Clinical  Impressions(s) / UC Diagnoses   Final diagnoses:  Bug bite with infection, initial encounter  Neck pain     Discharge Instructions     I have given you 3 doses of hydrocodone to manage pain.  You should not drive or drink alcohol with this medication as drowsiness is a common side effect.  Start antibiotics as prescribed by your PCP.  If area continues to swell or you have worsening pain you need to be reevaluated.    ED Prescriptions    Medication Sig Dispense Auth. Provider   HYDROcodone-acetaminophen (NORCO/VICODIN) 5-325 MG tablet Take 1 tablet by mouth every 6 (six) hours as needed for up to 3 doses for severe pain. 3 tablet Damarea Merkel K, PA-C     I have reviewed the PDMP during this encounter.   Terrilee Croak, PA-C 03/10/21 1045

## 2021-03-10 NOTE — ED Triage Notes (Signed)
Pt c/o left ear pain that radiates to the jaw X 3 days.

## 2021-03-11 NOTE — Congregational Nurse Program (Signed)
  Dept: [facility] Nurse Program Note  Date of Encounter: 03/09/2021   Client at [facility]. Requested to have her blood pressure checked. States that she has been having pain behind her left ear and that she has contacted her doctor who has called her in a prescription for an antibiotic. Client states that she has a history of hypertension and does take medication but had not taken it yet today. Nurse checked B/P 120/86. Instructed client to go ahead and take her HCTZ 12.5 mg and nurse will recheck BP in an hour.  Client returned for BP recheck BP 134/84. Nurse instructed client to continue taking med daily as ordered and to start antibiotic she states she's picking up today. Nurse will follow up with client next week when returning to shelter.  Masson Nalepa D. [name] MSN, RN Congregational Nurse [place] [phone]  Past Medical History: Past Medical History:  Diagnosis Date  . ADHD (attention deficit hyperactivity disorder)   . Anxiety    no meds  . Asthma   . Chronic back pain   . Depression    no meds  . Essential hypertension    a. Dx 06/2016.  . Migraines    last one over 1 month ago  . Overweight   . Palpitations - Sinus Tachycardia    a. 12/2016 Echo: E f55-60%, no rwma, nl LA/RA sizes.  . Placenta previa    w/ 2 pregnancies.  . Pre-diabetes   . Preterm labor    c/s at 36 wks  . Scoliosis   . Sinus tachycardia     Encounter Details:  CNP Questionnaire - 03/09/21 1530      Questionnaire   Do you give verbal consent to treat you today? Yes    Visit Setting Other    Location Patient Served At [facility]    Patient Status Homeless    Medical Provider Yes    Insurance Medicaid    Intervention Educate;Assess (including screenings)    Housing/Utilities No permanent housing    Transportation --   Client has transportation   Chiropractor --   Client feels safe here at [facility] --   Client has no food insecurities   Medication --   Client  has no medication insecurities   Referrals Other;PCP - other provider   Referred to [facility]otional Support Animal to help with locating her lost dogs.   Screening Referrals --   No referrals needed   ED Visit Averted Yes    Life-Saving Intervention Made Yes

## 2021-03-17 ENCOUNTER — Encounter (HOSPITAL_COMMUNITY): Payer: Self-pay | Admitting: Emergency Medicine

## 2021-03-17 ENCOUNTER — Ambulatory Visit: Payer: Self-pay

## 2021-03-17 ENCOUNTER — Ambulatory Visit (HOSPITAL_COMMUNITY)
Admission: EM | Admit: 2021-03-17 | Discharge: 2021-03-17 | Disposition: A | Discharge status: Home / Self Care | Payer: Medicaid Other | Attending: Emergency Medicine | Admitting: Emergency Medicine

## 2021-03-17 ENCOUNTER — Ambulatory Visit (INDEPENDENT_AMBULATORY_CARE_PROVIDER_SITE_OTHER): Payer: Medicaid Other

## 2021-03-17 ENCOUNTER — Encounter: Payer: Medicaid Other | Admitting: Physical Therapy

## 2021-03-17 ENCOUNTER — Other Ambulatory Visit: Payer: Self-pay

## 2021-03-17 DIAGNOSIS — B373 Candidiasis of vulva and vagina: Secondary | ICD-10-CM | POA: Insufficient documentation

## 2021-03-17 DIAGNOSIS — N760 Acute vaginitis: Secondary | ICD-10-CM | POA: Diagnosis present

## 2021-03-17 DIAGNOSIS — B3731 Acute candidiasis of vulva and vagina: Secondary | ICD-10-CM

## 2021-03-17 DIAGNOSIS — J4540 Moderate persistent asthma, uncomplicated: Secondary | ICD-10-CM

## 2021-03-17 MED ORDER — FLUCONAZOLE 150 MG PO TABS: 150.0000 mg | ORAL_TABLET | Freq: Every day | ORAL | 0 refills | Status: DC

## 2021-03-17 NOTE — ED Triage Notes (Signed)
Pt presents with vaginal irritation and discharge xs 2 days. States currently taking antibiotic.

## 2021-03-17 NOTE — ED Provider Notes (Signed)
[place]    CSN: [id number] Arrival date & time: 03/17/21  0944      History   Chief Complaint Chief Complaint  Patient presents with  . Vaginal Itching  . Vaginal Discharge    HPI Penny [name] is a 40 y.o. female.   Patient here for evaluation of vaginal itching and irritation as well as discharge.  Reports discharge is thick and yellowish-white.  Reports starting on antibiotic approximately a week ago.  Denies any odor.  Denies any trauma, injury, or other precipitating event.  Denies any specific alleviating or aggravating factors.  Denies any fevers, chest pain, shortness of breath, N/V/D, numbness, tingling, weakness, abdominal pain, or headaches.   ROS: As per HPI, all other pertinent ROS negative    The history is provided by the patient.  Vaginal Itching  Vaginal Discharge Associated symptoms: vaginal itching   Associated symptoms: no dysuria     Past Medical History:  Diagnosis Date  . ADHD (attention deficit hyperactivity disorder)   . Anxiety    no meds  . Asthma   . Chronic back pain   . Depression    no meds  . Essential hypertension    a. Dx 06/2016.  . Migraines    last one over 1 month ago  . Overweight   . Palpitations - Sinus Tachycardia    a. 12/2016 Echo: E f55-60%, no rwma, nl LA/RA sizes.  . Placenta previa    w/ 2 pregnancies.  . Pre-diabetes   . Preterm labor    c/s at 36 wks  . Scoliosis   . Sinus tachycardia     Patient Active Problem List   Diagnosis Date Noted  . Moderate persistent asthma with acute exacerbation 08/01/2020  . Uterine fibroid 01/23/2016  . Right groin pain 01/23/2016  . History of C-section 01/23/2016  . Migraines   . Anxiety   . Depression     Past Surgical History:  Procedure Laterality Date  . CESAREAN SECTION  2010   x 1 at 36 wks in [place]  . CESAREAN SECTION  February 13, 2012  . CHROMOPERTUBATION  10/26/2015   Procedure: ATTEMPTED CHROMOPERTUBATION;  Surgeon: Crawford Givens, MD;   Location: [facility];  Service: Gynecology;;  . LAPAROSCOPIC LYSIS OF ADHESIONS  10/26/2015   Procedure: LAPAROSCOPIC LYSIS OF ADHESIONS;  Surgeon: Crawford Givens, MD;  Location: [facility];  Service: Gynecology;;  . LAPAROSCOPY N/A 10/26/2015   Procedure: LAPAROSCOPY OPERATIVE WITH REMOVAL OF MISPLACED FILSHE CLIP;  Surgeon: Crawford Givens, MD;  Location: [facility];  Service: Gynecology;  Laterality: N/A;  . SVD  2003   x 1 in [place]  . TUBAL LIGATION    . WISDOM TOOTH EXTRACTION      OB History    Gravida  3   Para  3   Term  1   Preterm  2   AB  0   Living  3     SAB  0   IAB  0   Ectopic  0   Multiple  0   Live Births  3            Home Medications    Prior to Admission medications   Medication Sig Start Date End Date Taking? Authorizing Provider  fluconazole (DIFLUCAN) 150 MG tablet Take 1 tablet (150 mg total) by mouth daily. Take one tablet now and one in 3 days if you are still having symptoms 03/17/21  Yes Pearson Forster, NP  ACCU-CHEK GUIDE test strip USE 1 strip 2 TIMES DAILY BEFORE MEALS 04/23/20   [provider]  Accu-Chek Softclix Lancets lancets 2 (two) times daily. 04/24/20   [provider]  albuterol (PROVENTIL) (2.5 MG/3ML) 0.083% nebulizer solution Take 3 mLs (2.5 mg total) by nebulization every 6 (six) hours as needed for wheezing or shortness of breath. 08/14/20   Vanessa Kick, MD  alum & mag hydroxide-simeth [facility] MAXIMUM STRENGTH) 400-400-40 MG/5ML suspension Take 10 mLs by mouth every 6 (six) hours as needed for indigestion. 02/08/21   Loni Beckwith, PA-C  amoxicillin-clavulanate (AUGMENTIN) 875-125 MG tablet amoxicillin 875 mg-potassium clavulanate 125 mg tablet  Take 1 tablet every 12 hours by oral route.    [provider]  benzonatate (TESSALON PERLES) 100 MG capsule Take 1 capsule (100 mg total) by mouth 2 (two) times daily as needed for cough. 05/27/20 05/27/21  Rudolpho Sevin, NP  Blood Glucose Monitoring Suppl  (ACCU-CHEK GUIDE) w/Device KIT See admin instructions. 04/23/20   [provider]  budesonide-formoterol (SYMBICORT) 160-4.5 MCG/ACT inhaler Inhale 2 puffs into the lungs in the morning and at bedtime. 08/01/20   Faustino Congress, NP  EPINEPHrine 0.3 mg/0.3 mL IJ SOAJ injection SMARTSIG:1 Syringe(s) IM PRN 01/14/20   [provider]  EPINEPHrine 0.3 mg/0.3 mL IJ SOAJ injection Inject 0.3 mg into the muscle as needed for anaphylaxis. As needed for life-threatening allergic reactions 08/27/20   Valentina Shaggy, MD  esomeprazole (NEXIUM) 40 MG capsule Take 1 capsule (40 mg total) by mouth daily. 08/14/20   Vanessa Kick, MD  fluticasone (FLOVENT HFA) 220 MCG/ACT inhaler Inhale 2 puffs into the lungs in the morning and at bedtime. 10/15/20   Valentina Shaggy, MD  gabapentin (NEURONTIN) 800 MG tablet Take 800 mg by mouth 3 (three) times daily.    [provider]  HYDROcodone-acetaminophen (NORCO/VICODIN) 5-325 MG tablet Take 1 tablet by mouth every 6 (six) hours as needed for up to 3 doses for severe pain. 03/10/21   Raspet, Erin K, PA-C  ipratropium (ATROVENT) 0.03 % nasal spray Place 2 sprays into both nostrils every 12 (twelve) hours. 05/28/20   Lamptey, Myrene Galas, MD  levocetirizine (XYZAL) 5 MG tablet Take 1 tablet (5 mg total) by mouth every evening. 07/29/20   Volney American, PA-C  levonorgestrel (MIRENA, 52 MG,) 20 MCG/24HR IUD Mirena 20 mcg/24 hours (7 yrs) 52 mg intrauterine device  Take by intrauterine route.    [provider]  metoprolol succinate (TOPROL-XL) 25 MG 24 hr tablet Take 25 mg by mouth 2 (two) times daily. 07/29/20   [provider]  montelukast (SINGULAIR) 10 MG tablet Take 1 tablet (10 mg total) by mouth at bedtime. 07/29/20   Volney American, PA-C  nicotine (NICODERM CQ - DOSED IN MG/24 HOURS) 14 mg/24hr patch Place 1 patch (14 mg total) onto the skin daily. 08/27/20   Valentina Shaggy, MD  Olopatadine HCl  (PATADAY) 0.2 % SOLN Place 1 drop into both eyes daily as needed. 09/25/20   Valentina Shaggy, MD  promethazine (PHENERGAN) 12.5 MG tablet Take 1 tablet (12.5 mg total) by mouth every 6 (six) hours as needed for nausea or vomiting. 07/29/20   Volney American, PA-C  promethazine (PHENERGAN) 25 MG tablet Take 1 tablet (25 mg total) by mouth every 6 (six) hours as needed for up to 3 days for nausea or vomiting. 06/13/20 06/16/20  Konrad Felix, PA-C  tiZANidine (ZANAFLEX) 4 MG tablet Take 4 mg  by mouth every 6 (six) hours as needed for muscle spasms.    [provider]  XOPENEX HFA 45 MCG/ACT inhaler Inhale 2 puffs into the lungs every 4 (four) hours as needed for wheezing. 09/01/20   Valentina Shaggy, MD  Cetirizine HCl 10 MG CAPS Take 1 capsule (10 mg total) by mouth daily for 10 days. Patient not taking: Reported on 06/22/2019 02/11/19 08/18/19  Wieters, Elesa Hacker, PA-C  Dextromethorphan HBr 10 MG/5ML SYRP 10 ml qhs prn cough at night time. Patient not taking: Reported on 06/22/2019 02/04/19 08/18/19  Rodriguez-Southworth, Sunday Spillers, PA-C  fluticasone [facility]) 50 MCG/ACT nasal spray Place 1 spray into both nostrils daily. 05/27/20 05/28/20  Rudolpho Sevin, NP  norethindrone (AYGESTIN) 5 MG tablet Take by mouth. 05/07/20 05/28/20  [provider]  Topiramate ER (TROKENDI XR) 200 MG CP24 Take 1 capsule by mouth daily. 08/27/19 05/28/20  [provider]    Family History Family History  Problem Relation Age of Onset  . Cancer Mother   . CAD Mother   . Hypertension Sister   . Sickle cell trait Daughter   . Asthma Son   . Asthma Paternal Grandmother   . Pseudochol deficiency Neg Hx   . Malignant hyperthermia Neg Hx   . Anesthesia problems Neg Hx   . Hypotension Neg Hx     Social History Social History   Tobacco Use  . Smoking status: Current Every Day Smoker    Packs/day: 0.10    Years: 18.00    Pack years: 1.80    Types: Cigarettes  . Smokeless tobacco:  Never Used  Vaping Use  . Vaping Use: Former  Substance Use Topics  . Alcohol use: Yes    Comment: Rare   . Drug use: Not Currently    Types: Marijuana     Allergies   Other, Shrimp extract allergy skin test, and Shrimp [shellfish allergy]   Review of Systems Review of Systems  Genitourinary: Positive for vaginal discharge. Negative for difficulty urinating, dysuria, flank pain, frequency and genital sores.  All other systems reviewed and are negative.    Physical Exam Triage Vital Signs ED Triage Vitals  Enc Vitals Group     BP 03/17/21 1020 122/70     Pulse Rate 03/17/21 1020 87     Resp 03/17/21 1020 16     Temp 03/17/21 1020 98.3 F (36.8 C)     Temp Source 03/17/21 1020 Oral     SpO2 03/17/21 1020 97 %     Weight --      Height --      Head Circumference --      Peak Flow --      Pain Score 03/17/21 1017 0     Pain Loc --      Pain Edu? --      Excl. in [place]? --    No data found.  Updated Vital Signs BP 122/70 (BP Location: Right Arm)   Pulse 87   Temp 98.3 F (36.8 C) (Oral)   Resp 16   LMP  (LMP Unknown)   SpO2 97%   Visual Acuity Right Eye Distance:   Left Eye Distance:   Bilateral Distance:    Right Eye Near:   Left Eye Near:    Bilateral Near:     Physical Exam Vitals and nursing note reviewed.  Constitutional:      General: She is not in acute distress.    Appearance: Normal appearance. She is  not ill-appearing, toxic-appearing or diaphoretic.  HENT:     Head: Normocephalic and atraumatic.  Eyes:     Conjunctiva/sclera: Conjunctivae normal.  Cardiovascular:     Rate and Rhythm: Normal rate.     Pulses: Normal pulses.  Pulmonary:     Effort: Pulmonary effort is normal.  Abdominal:     General: Abdomen is flat.  Musculoskeletal:        General: Normal range of motion.     Cervical back: Normal range of motion.  Skin:    General: Skin is warm and dry.  Neurological:     General: No focal deficit present.     Mental Status: She  is alert and oriented to person, place, and time.  Psychiatric:        Mood and Affect: Mood normal.      UC Treatments / Results  Labs (all labs ordered are listed, but only abnormal results are displayed) Labs Reviewed  CERVICOVAGINAL ANCILLARY ONLY    EKG   Radiology No results found.  Procedures Procedures (including critical care time)  Medications Ordered in UC Medications - No data to display  Initial Impression / Assessment and Plan / UC Course  I have reviewed the triage vital signs and the nursing notes.  Pertinent labs & imaging results that were available during my care of the patient were reviewed by me and considered in my medical decision making (see chart for details).     Assessment negative for red flags or concerns.  Based on symptoms this is likely a yeast infection.  Self swab for yeast and BV obtained.  We will go ahead and prescribe Diflucan x1 today and repeat in 3 days if still having symptoms. Encourage fluids and rest.  Follow up as needed.   Final Clinical Impressions(s) / UC Diagnoses   Final diagnoses:  Vaginal yeast infection  Acute vaginitis     Discharge Instructions     Continue taking all of your medications as previously prescribed.    Take the Diflucan today.  If you are still having symptoms in 3 days, you can take the second Diflucan.    We will contact you if your swab is positive for bacteria.    Return or go to the Emergency Department if symptoms worsen or do not improve in the next few days.      ED Prescriptions    Medication Sig Dispense Auth. Provider   fluconazole (DIFLUCAN) 150 MG tablet Take 1 tablet (150 mg total) by mouth daily. Take one tablet now and one in 3 days if you are still having symptoms 2 tablet Pearson Forster, NP     PDMP not reviewed this encounter.   Pearson Forster, NP 03/17/21 1053

## 2021-03-17 NOTE — Discharge Instructions (Signed)
Continue taking all of your medications as previously prescribed.    Take the Diflucan today.  If you are still having symptoms in 3 days, you can take the second Diflucan.    We will contact you if your swab is positive for bacteria.    Return or go to the Emergency Department if symptoms worsen or do not improve in the next few days.

## 2021-03-18 ENCOUNTER — Telehealth (HOSPITAL_COMMUNITY): Payer: Self-pay | Admitting: Emergency Medicine

## 2021-03-18 LAB — CERVICOVAGINAL ANCILLARY ONLY
Bacterial Vaginitis (gardnerella): POSITIVE — AB
Candida Glabrata: NEGATIVE
Candida Vaginitis: POSITIVE — AB
Comment: NEGATIVE
Comment: NEGATIVE
Comment: NEGATIVE

## 2021-03-18 MED ORDER — METRONIDAZOLE 500 MG PO TABS: 500.0000 mg | ORAL_TABLET | Freq: Two times a day (BID) | ORAL | 0 refills | Status: DC

## 2021-03-23 ENCOUNTER — Ambulatory Visit (HOSPITAL_COMMUNITY)
Admission: EM | Admit: 2021-03-23 | Discharge: 2021-03-23 | Payer: Medicaid Other | Attending: Student | Admitting: Student

## 2021-03-23 ENCOUNTER — Other Ambulatory Visit: Payer: Self-pay

## 2021-03-23 ENCOUNTER — Encounter (HOSPITAL_COMMUNITY): Payer: Self-pay | Admitting: Emergency Medicine

## 2021-03-23 ENCOUNTER — Emergency Department (HOSPITAL_COMMUNITY)
Admission: EM | Admit: 2021-03-23 | Discharge: 2021-03-23 | Disposition: A | Discharge status: Home / Self Care | Payer: Medicaid Other | Attending: Emergency Medicine | Admitting: Emergency Medicine

## 2021-03-23 ENCOUNTER — Emergency Department (HOSPITAL_COMMUNITY): Payer: Medicaid Other

## 2021-03-23 DIAGNOSIS — I10 Essential (primary) hypertension: Secondary | ICD-10-CM | POA: Insufficient documentation

## 2021-03-23 DIAGNOSIS — J45909 Unspecified asthma, uncomplicated: Secondary | ICD-10-CM | POA: Diagnosis not present

## 2021-03-23 DIAGNOSIS — Z7951 Long term (current) use of inhaled steroids: Secondary | ICD-10-CM | POA: Insufficient documentation

## 2021-03-23 DIAGNOSIS — Z79899 Other long term (current) drug therapy: Secondary | ICD-10-CM | POA: Insufficient documentation

## 2021-03-23 DIAGNOSIS — R202 Paresthesia of skin: Secondary | ICD-10-CM | POA: Diagnosis not present

## 2021-03-23 DIAGNOSIS — F17210 Nicotine dependence, cigarettes, uncomplicated: Secondary | ICD-10-CM | POA: Insufficient documentation

## 2021-03-23 DIAGNOSIS — R002 Palpitations: Secondary | ICD-10-CM

## 2021-03-23 DIAGNOSIS — R42 Dizziness and giddiness: Secondary | ICD-10-CM | POA: Diagnosis not present

## 2021-03-23 DIAGNOSIS — R200 Anesthesia of skin: Secondary | ICD-10-CM

## 2021-03-23 LAB — URINALYSIS, ROUTINE W REFLEX MICROSCOPIC
Bacteria, UA: NONE SEEN
Bilirubin Urine: NEGATIVE
Glucose, UA: NEGATIVE mg/dL
Ketones, ur: NEGATIVE mg/dL
Leukocytes,Ua: NEGATIVE
Nitrite: NEGATIVE
Protein, ur: NEGATIVE mg/dL
Specific Gravity, Urine: 1.025 (ref 1.005–1.030)
Squamous Epithelial / HPF: >50 — ABNORMAL HIGH (ref 0–5)
pH: 5 (ref 5.0–8.0)

## 2021-03-23 LAB — TROPONIN I (HIGH SENSITIVITY): Troponin I (High Sensitivity): 3 ng/L (ref <18)

## 2021-03-23 LAB — BASIC METABOLIC PANEL
Anion gap: 10 (ref 5–15)
BUN: 9 mg/dL (ref 6–20)
CO2: 23 mmol/L (ref 22–32)
Calcium: 9 mg/dL (ref 8.9–10.3)
Chloride: 101 mmol/L (ref 98–111)
Creatinine, Ser: 0.67 mg/dL (ref 0.44–1.00)
GFR, Estimated: >60 mL/min (ref >60)
Glucose, Bld: 79 mg/dL (ref 70–99)
Potassium: 3.9 mmol/L (ref 3.5–5.1)
Sodium: 134 mmol/L — ABNORMAL LOW (ref 135–145)

## 2021-03-23 LAB — CBC
HCT: 47.8 % — ABNORMAL HIGH (ref 36.0–46.0)
Hemoglobin: 15.2 g/dL — ABNORMAL HIGH (ref 12.0–15.0)
MCH: 27.5 pg (ref 26.0–34.0)
MCHC: 31.8 g/dL (ref 30.0–36.0)
MCV: 86.4 fL (ref 80.0–100.0)
Platelets: 387 10*3/uL (ref 150–400)
RBC: 5.53 MIL/uL — ABNORMAL HIGH (ref 3.87–5.11)
RDW: 14.5 % (ref 11.5–15.5)
WBC: 16.8 10*3/uL — ABNORMAL HIGH (ref 4.0–10.5)
nRBC: 0 % (ref 0.0–0.2)

## 2021-03-23 LAB — MAGNESIUM: Magnesium: 1.8 mg/dL (ref 1.7–2.4)

## 2021-03-23 LAB — D-DIMER, QUANTITATIVE: D-Dimer, Quant: <0.27 ug/mL-FEU (ref 0.00–0.50)

## 2021-03-23 LAB — RAPID URINE DRUG SCREEN, HOSP PERFORMED
Amphetamines: NOT DETECTED
Barbiturates: NOT DETECTED
Benzodiazepines: NOT DETECTED
Cocaine: NOT DETECTED
Opiates: NOT DETECTED
Tetrahydrocannabinol: NOT DETECTED

## 2021-03-23 LAB — TSH: TSH: 1.403 u[IU]/mL (ref 0.350–4.500)

## 2021-03-23 LAB — POC URINE PREG, ED: Preg Test, Ur: NEGATIVE

## 2021-03-23 NOTE — ED Provider Notes (Signed)
[facility] EMERGENCY DEPARTMENT Provider Note   CSN: [id number] Arrival date & time: 03/23/21  1825     History Chief Complaint  Patient presents with  . Tachycardia    Penny [name] is a 40 y.o. female with a past medical history of anxiety, asthma, sinus tachycardia, ADHD, who presents today for evaluation of tingling and feeling like her heart is racing. She states that she had a hour of feeling like her heart was racing.  This started after she walked for about 10 minutes to the store and back.  She denies any chest pain or shortness of breath during this, she did feel lightheaded however did not pass out.  She states that her watch showed her heart rate was in the 140s and this lasted for about an hour. She states that then the palpitations went away and her tingling started.  She reports that she had tingling in her left arm and her left leg.  This was not in the entire leg.  She states that it was the front and back of her left leg, not the medial or lateral aspect in the entire leg, and part of her arm  HPI     Past Medical History:  Diagnosis Date  . ADHD (attention deficit hyperactivity disorder)   . Anxiety    no meds  . Asthma   . Chronic back pain   . Depression    no meds  . Essential hypertension    a. Dx 06/2016.  . Migraines    last one over 1 month ago  . Overweight   . Palpitations - Sinus Tachycardia    a. 12/2016 Echo: E f55-60%, no rwma, nl LA/RA sizes.  . Placenta previa    w/ 2 pregnancies.  . Pre-diabetes   . Preterm labor    c/s at 36 wks  . Scoliosis   . Sinus tachycardia     Patient Active Problem List   Diagnosis Date Noted  . Moderate persistent asthma with acute exacerbation 08/01/2020  . Uterine fibroid 01/23/2016  . Right groin pain 01/23/2016  . History of C-section 01/23/2016  . Migraines   . Anxiety   . Depression     Past Surgical History:  Procedure Laterality Date  . CESAREAN SECTION  2010   x 1  at 36 wks in [place]  . CESAREAN SECTION  February 13, 2012  . CHROMOPERTUBATION  10/26/2015   Procedure: ATTEMPTED CHROMOPERTUBATION;  Surgeon: Crawford Givens, MD;  Location: [facility];  Service: Gynecology;;  . LAPAROSCOPIC LYSIS OF ADHESIONS  10/26/2015   Procedure: LAPAROSCOPIC LYSIS OF ADHESIONS;  Surgeon: Crawford Givens, MD;  Location: [facility];  Service: Gynecology;;  . LAPAROSCOPY N/A 10/26/2015   Procedure: LAPAROSCOPY OPERATIVE WITH REMOVAL OF MISPLACED FILSHE CLIP;  Surgeon: Crawford Givens, MD;  Location: [facility];  Service: Gynecology;  Laterality: N/A;  . SVD  2003   x 1 in [place]  . TUBAL LIGATION    . WISDOM TOOTH EXTRACTION       OB History    Gravida  3   Para  3   Term  1   Preterm  2   AB  0   Living  3     SAB  0   IAB  0   Ectopic  0   Multiple  0   Live Births  3           Family History  Problem Relation Age of Onset  .  Cancer Mother   . CAD Mother   . Hypertension Sister   . Sickle cell trait Daughter   . Asthma Son   . Asthma Paternal Grandmother   . Pseudochol deficiency Neg Hx   . Malignant hyperthermia Neg Hx   . Anesthesia problems Neg Hx   . Hypotension Neg Hx     Social History   Tobacco Use  . Smoking status: Current Every Day Smoker    Packs/day: 0.10    Years: 18.00    Pack years: 1.80    Types: Cigarettes  . Smokeless tobacco: Never Used  Vaping Use  . Vaping Use: Former  Substance Use Topics  . Alcohol use: Yes    Comment: Rare   . Drug use: Not Currently    Types: Marijuana    Home Medications Prior to Admission medications   Medication Sig Start Date End Date Taking? Authorizing Provider  ACCU-CHEK GUIDE test strip USE 1 strip 2 TIMES DAILY BEFORE MEALS 04/23/20   [provider]  Accu-Chek Softclix Lancets lancets 2 (two) times daily. 04/24/20   [provider]  albuterol (PROVENTIL) (2.5 MG/3ML) 0.083% nebulizer solution Take 3 mLs (2.5 mg total) by nebulization every 6 (six) hours as needed for  wheezing or shortness of breath. 08/14/20   Vanessa Kick, MD  alum & mag hydroxide-simeth [facility] MAXIMUM STRENGTH) 400-400-40 MG/5ML suspension Take 10 mLs by mouth every 6 (six) hours as needed for indigestion. 02/08/21   Loni Beckwith, PA-C  amoxicillin-clavulanate (AUGMENTIN) 875-125 MG tablet amoxicillin 875 mg-potassium clavulanate 125 mg tablet  Take 1 tablet every 12 hours by oral route.    [provider]  benzonatate (TESSALON PERLES) 100 MG capsule Take 1 capsule (100 mg total) by mouth 2 (two) times daily as needed for cough. 05/27/20 05/27/21  Rudolpho Sevin, NP  Blood Glucose Monitoring Suppl (ACCU-CHEK GUIDE) w/Device KIT See admin instructions. 04/23/20   [provider]  budesonide-formoterol (SYMBICORT) 160-4.5 MCG/ACT inhaler Inhale 2 puffs into the lungs in the morning and at bedtime. 08/01/20   Faustino Congress, NP  EPINEPHrine 0.3 mg/0.3 mL IJ SOAJ injection SMARTSIG:1 Syringe(s) IM PRN 01/14/20   [provider]  EPINEPHrine 0.3 mg/0.3 mL IJ SOAJ injection Inject 0.3 mg into the muscle as needed for anaphylaxis. As needed for life-threatening allergic reactions 08/27/20   Valentina Shaggy, MD  esomeprazole (NEXIUM) 40 MG capsule Take 1 capsule (40 mg total) by mouth daily. 08/14/20   Vanessa Kick, MD  fluconazole (DIFLUCAN) 150 MG tablet Take 1 tablet (150 mg total) by mouth daily. Take one tablet now and one in 3 days if you are still having symptoms 03/17/21   Pearson Forster, NP  fluticasone (FLOVENT HFA) 220 MCG/ACT inhaler Inhale 2 puffs into the lungs in the morning and at bedtime. 10/15/20   Valentina Shaggy, MD  gabapentin (NEURONTIN) 800 MG tablet Take 800 mg by mouth 3 (three) times daily.    [provider]  HYDROcodone-acetaminophen (NORCO/VICODIN) 5-325 MG tablet Take 1 tablet by mouth every 6 (six) hours as needed for up to 3 doses for severe pain. 03/10/21   Raspet, Erin K, PA-C  ipratropium (ATROVENT) 0.03 % nasal  spray Place 2 sprays into both nostrils every 12 (twelve) hours. 05/28/20   Lamptey, Myrene Galas, MD  levocetirizine (XYZAL) 5 MG tablet Take 1 tablet (5 mg total) by mouth every evening. 07/29/20   Volney American, PA-C  levonorgestrel (MIRENA, 52 MG,) 20 MCG/24HR IUD Mirena  20 mcg/24 hours (7 yrs) 52 mg intrauterine device  Take by intrauterine route.    [provider]  metoprolol succinate (TOPROL-XL) 25 MG 24 hr tablet Take 25 mg by mouth 2 (two) times daily. 07/29/20   [provider]  metroNIDAZOLE (FLAGYL) 500 MG tablet Take 1 tablet (500 mg total) by mouth 2 (two) times daily. 03/18/21   Lamptey, Myrene Galas, MD  montelukast (SINGULAIR) 10 MG tablet Take 1 tablet (10 mg total) by mouth at bedtime. 07/29/20   Volney American, PA-C  nicotine (NICODERM CQ - DOSED IN MG/24 HOURS) 14 mg/24hr patch Place 1 patch (14 mg total) onto the skin daily. 08/27/20   Valentina Shaggy, MD  Olopatadine HCl (PATADAY) 0.2 % SOLN Place 1 drop into both eyes daily as needed. 09/25/20   Valentina Shaggy, MD  promethazine (PHENERGAN) 12.5 MG tablet Take 1 tablet (12.5 mg total) by mouth every 6 (six) hours as needed for nausea or vomiting. 07/29/20   Volney American, PA-C  promethazine (PHENERGAN) 25 MG tablet Take 1 tablet (25 mg total) by mouth every 6 (six) hours as needed for up to 3 days for nausea or vomiting. 06/13/20 06/16/20  Konrad Felix, PA-C  tiZANidine (ZANAFLEX) 4 MG tablet Take 4 mg by mouth every 6 (six) hours as needed for muscle spasms.    [provider]  XOPENEX HFA 45 MCG/ACT inhaler Inhale 2 puffs into the lungs every 4 (four) hours as needed for wheezing. 09/01/20   Valentina Shaggy, MD  Cetirizine HCl 10 MG CAPS Take 1 capsule (10 mg total) by mouth daily for 10 days. Patient not taking: Reported on 06/22/2019 02/11/19 08/18/19  Wieters, Elesa Hacker, PA-C  Dextromethorphan HBr 10 MG/5ML SYRP 10 ml qhs prn cough at night time. Patient not taking:  Reported on 06/22/2019 02/04/19 08/18/19  Rodriguez-Southworth, Sunday Spillers, PA-C  fluticasone [facility]) 50 MCG/ACT nasal spray Place 1 spray into both nostrils daily. 05/27/20 05/28/20  Rudolpho Sevin, NP  norethindrone (AYGESTIN) 5 MG tablet Take by mouth. 05/07/20 05/28/20  [provider]  Topiramate ER (TROKENDI XR) 200 MG CP24 Take 1 capsule by mouth daily. 08/27/19 05/28/20  [provider]    Allergies    Other, Shrimp extract allergy skin test, Shellfish-derived products, and Shrimp [shellfish allergy]  Review of Systems   Review of Systems  Constitutional: Negative for chills and fever.  Respiratory: Negative for choking and wheezing.   Cardiovascular: Positive for palpitations. Negative for chest pain and leg swelling.  Gastrointestinal: Negative for abdominal pain, diarrhea, nausea and vomiting.  Genitourinary: Negative for dysuria and urgency.  Neurological: Negative for weakness, numbness (Tingling in left arm and leg after palpitations stopped. ) and headaches.  All other systems reviewed and are negative.   Physical Exam Updated Vital Signs BP (!) 134/96   Pulse 89   Temp 99 F (37.2 C) (Oral)   Resp 19   LMP  (LMP Unknown)   SpO2 95%   Physical Exam Vitals and nursing note reviewed.  Constitutional:      General: She is not in acute distress.    Appearance: She is well-developed.  HENT:     Head: Normocephalic and atraumatic.  Eyes:     Conjunctiva/sclera: Conjunctivae normal.  Cardiovascular:     Rate and Rhythm: Normal rate and regular rhythm.     Pulses: Normal pulses.     Heart sounds: Normal heart sounds. No murmur heard.   Pulmonary:  Effort: Pulmonary effort is normal. No respiratory distress.     Breath sounds: Normal breath sounds.  Abdominal:     General: There is no distension.     Palpations: Abdomen is soft.     Tenderness: There is no abdominal tenderness.  Musculoskeletal:     Cervical back: Normal range of motion and neck  supple.     Right lower leg: No edema.     Left lower leg: No edema.  Skin:    General: Skin is warm and dry.  Neurological:     Mental Status: She is alert.     Comments: Patient is awake and alert.  Speech is not slurred.  5/5 strength bilateral upper and lower extremities.  Sensation intact to light touch to bilateral arms and legs.  Psychiatric:        Mood and Affect: Mood normal.        Behavior: Behavior normal.     ED Results / Procedures / Treatments   Labs (all labs ordered are listed, but only abnormal results are displayed) Labs Reviewed  CBC - Abnormal; Notable for the following components:      Result Value   WBC 16.8 (*)    RBC 5.53 (*)    Hemoglobin 15.2 (*)    HCT 47.8 (*)    All other components within normal limits  BASIC METABOLIC PANEL - Abnormal; Notable for the following components:   Sodium 134 (*)    All other components within normal limits  URINALYSIS, ROUTINE W REFLEX MICROSCOPIC - Abnormal; Notable for the following components:   APPearance CLOUDY (*)    Hgb urine dipstick MODERATE (*)    Squamous Epithelial / LPF >50 (*)    All other components within normal limits  TSH  MAGNESIUM  RAPID URINE DRUG SCREEN, HOSP PERFORMED  D-DIMER, QUANTITATIVE  POC URINE PREG, ED  TROPONIN I (HIGH SENSITIVITY)  TROPONIN I (HIGH SENSITIVITY)    EKG EKG Interpretation  Date/Time:  Tuesday Mar 23 2021 18:37:29 EDT Ventricular Rate:  91 PR Interval:  150 QRS Duration: 74 QT Interval:  352 QTC Calculation: 432 R Axis:   37 Text Interpretation: Sinus rhythm with frequent Premature ventricular complexes Septal infarct , age undetermined Abnormal ECG Since last tracing ectopy now present Confirmed by Noemi Chapel [phone]) on 03/23/2021 9:16:00 PM   Radiology DG Chest 2 View  Result Date: 03/23/2021 CLINICAL DATA:  Tachycardia EXAM: CHEST - 2 VIEW COMPARISON:  02/08/2021. FINDINGS: The cardiomediastinal contours are normal. Moderate bronchial thickening.  Pulmonary vasculature is normal. No consolidation, pleural effusion, or pneumothorax. Thoracic scoliosis. No acute osseous abnormalities are seen. IMPRESSION: Moderate bronchial thickening suggesting bronchitis or asthma. Electronically Signed   By: Keith Rake M.D.   On: 03/23/2021 22:00    Procedures Procedures   Medications Ordered in ED Medications - No data to display  ED Course  I have reviewed the triage vital signs and the nursing notes.  Pertinent labs & imaging results that were available during my care of the patient were reviewed by me and considered in my medical decision making (see chart for details).  Clinical Course as of 03/23/21 2319  Tue Mar 23, 2021  2113 Per RN "Pt ambulated around nurses station. Initial HR 86 at rest. Pt's HR increased to 116 while ambulating. After a few minutes of rest pt HR decreased to 93. " [EH]    Clinical Course User Index [EH] Ollen Gross   MDM Rules/Calculators/A&P  Patient is a 40 year old woman who presents today for evaluation of elevated heart rate in the 140s per her smart watch after she walked to the store which resolved after about an hour per her report and then was followed by paresthesias for a few hours in her left arm and leg.  She did not have any chest pain, back pain or abdominal pain at any point during this.  The paresthesias in her legs were in a abnormal distribution as it was the entire front of the leg, the entire back of the left leg and did not include medial or lateral sides of the left leg.  The paresthesias in her arm additionally were not the entire arm and was only a specific band. At the time of my evaluation all of these symptoms have resolved.  She was originally seen at urgent care who sent her here for evaluation of troponins.  Patient's white count and hemoglobin are both slightly elevated compared to her priors.  UA shows specific gravity of 1.025 which may be  suggestive of mild dehydration.  She is not having any UTI symptoms and her squamous epithelial cells were over 50, no evidence of UTI.  BMP is unremarkable.  Magnesium is not elevated, TSH is normal.  UDS is negative, pregnancy test is negative. D-dimer is obtained and is not elevated.  Chest x-ray shows mild bronchial thickening consistent with asthma. Patient has not wheezing right now, does have a longstanding history of asthma and I do not think that this is related to her symptoms today. She was able to ambulate in the ER without becoming hypoxic.  Her troponin is not elevated, is 3. Given that her symptoms and palpitations resolved multiple hours prior to her troponin being drawn this is effectively a second troponin and with it being 3 there is no need for a delta troponin.  Will be discharged with recommendation to follow-up with PCP. Additionally recommended that she ensure she is drinking adequate water.  Return precautions were discussed with patient who states their understanding.  At the time of discharge patient denied any unaddressed complaints or concerns.  Patient is agreeable for discharge home.  Note: Portions of this report may have been transcribed using voice recognition software. Every effort was made to ensure accuracy; however, inadvertent computerized transcription errors may be present   Final Clinical Impression(s) / ED Diagnoses Final diagnoses:  Palpitations  Paresthesia of left upper and lower extremity    Rx / DC Orders ED Discharge Orders    None       Ollen Gross 03/23/21 2319    Noemi Chapel, MD 03/24/21 1600

## 2021-03-23 NOTE — Discharge Instructions (Addendum)
-  Head straight to [facility] emergency room for further evaluation and management of your chest pain, dizziness, left arm and leg numbness and tingling.  I am concerned that you are having a cardiac event, and I cannot fully evaluate this here, nor can I treat it here. This can be life altering or even deadly if not treated quickly.  Head straight to the emergency room.  If you develop worsening of symptoms on the way, stop and call 911 immediately

## 2021-03-23 NOTE — ED Notes (Signed)
Patient transported to X-ray 

## 2021-03-23 NOTE — ED Provider Notes (Signed)
Emergency Medicine Provider Triage Evaluation Note  Penny [name] , a 40 y.o. female  was evaluated in triage.  Pt complains of palpitations that began today associated with light headedness, tingling in different spots in her left arm, bilateral legs.  History of fast heart rate in 2018. No CP, Sob, syncope.   Review of Systems  Positive: Palpitations, light headedness, tingling Negative: Cp Sob syncope  Physical Exam  BP (!) 131/93 (BP Location: Left Arm)   Pulse 94   Temp 99 F (37.2 C) (Oral)   Resp 16   LMP  (LMP Unknown)   SpO2 100%  Gen:   Awake, no distress   Resp:  Normal effort  MSK:   Moves extremities without difficulty  Card:  RRR. No LE edema    Medical Decision Making  Medically screening exam initiated at 6:39 PM.  Appropriate orders placed.  Penny [name] was informed that the remainder of the evaluation will be completed by another provider, this initial triage assessment does not replace that evaluation, and the importance of remaining in the ED until their evaluation is complete.    Penny Feil, PA-C 03/23/21 Penny [name], [place], DO 03/24/21 [phone]

## 2021-03-23 NOTE — ED Notes (Signed)
Pt ambulated around nurses station. Initial HR 86 at rest. Pt's HR increased to 116 while ambulating. After a few minutes of rest pt HR decreased to 93.

## 2021-03-23 NOTE — ED Notes (Signed)
Patient is being discharged from the Urgent Care and sent to the Emergency Department via personal vehicle . Per provider Marin Roberts, patient is in need of higher level of care due to chest pain. Patient is aware and verbalizes understanding of plan of care.   Vitals:   03/23/21 1706  BP: 112/77  Pulse: 95  Resp: 20  Temp: 99.2 F (37.3 C)  SpO2: 97%

## 2021-03-23 NOTE — ED Triage Notes (Signed)
Pt sent by UC for further evaluation of tachycardia, states her HR was 140 today, lasting an hour. Pt reports tingling to left arm and left leg. Denies chest pain at this time.

## 2021-03-23 NOTE — Discharge Instructions (Signed)
If your palpitations or other symptoms return please return to the emergency room. As we discussed my ability to evaluate you for these concerns is limited when the already resolved. If you develop fevers, shortness of breath, recurring high heart rates, especially if it does not get better with rest, or have any other concerns please seek additional medical care and evaluation. Please follow-up with your primary care doctor.

## 2021-03-23 NOTE — ED Provider Notes (Signed)
[place]    CSN: [id number] Arrival date & time: 03/23/21  1638      History   Chief Complaint Chief Complaint  Patient presents with  . Palpitations  . Dizziness    HPI Penny [name] is a 40 y.o. female presenting with palpitations, dizziness, left arm/leg numbness and tingling.  Medical history palpitations and sinus tachycardia, echo 2018 was fairly normal; hypertension, overweight, prediabetes, asthma, anxiety, depression.  Notes 1 day of feeling of heart racing with exertion.  States her heart races for about 4 minutes after she walks, states her pulse was gotten up to 140.  Symptoms resolve on their own after few minutes. Dizziness during these episodes.  Numbness and tingling in left arm and left leg, worse in arm.  Denies chest pain, shortness of breath.  Denies weakness in arms or legs.  Denies headaches, vision changes. Denies abdominal pain, n/v/d, urinary symptoms. For hypertension, is taking metoprolol as directed.  HPI  Past Medical History:  Diagnosis Date  . ADHD (attention deficit hyperactivity disorder)   . Anxiety    no meds  . Asthma   . Chronic back pain   . Depression    no meds  . Essential hypertension    a. Dx 06/2016.  . Migraines    last one over 1 month ago  . Overweight   . Palpitations - Sinus Tachycardia    a. 12/2016 Echo: E f55-60%, no rwma, nl LA/RA sizes.  . Placenta previa    w/ 2 pregnancies.  . Pre-diabetes   . Preterm labor    c/s at 36 wks  . Scoliosis   . Sinus tachycardia     Patient Active Problem List   Diagnosis Date Noted  . Moderate persistent asthma with acute exacerbation 08/01/2020  . Uterine fibroid 01/23/2016  . Right groin pain 01/23/2016  . History of C-section 01/23/2016  . Migraines   . Anxiety   . Depression     Past Surgical History:  Procedure Laterality Date  . CESAREAN SECTION  2010   x 1 at 36 wks in [place]  . CESAREAN SECTION  February 13, 2012  . CHROMOPERTUBATION  10/26/2015    Procedure: ATTEMPTED CHROMOPERTUBATION;  Surgeon: Crawford Givens, MD;  Location: [facility];  Service: Gynecology;;  . LAPAROSCOPIC LYSIS OF ADHESIONS  10/26/2015   Procedure: LAPAROSCOPIC LYSIS OF ADHESIONS;  Surgeon: Crawford Givens, MD;  Location: [facility];  Service: Gynecology;;  . LAPAROSCOPY N/A 10/26/2015   Procedure: LAPAROSCOPY OPERATIVE WITH REMOVAL OF MISPLACED FILSHE CLIP;  Surgeon: Crawford Givens, MD;  Location: [facility];  Service: Gynecology;  Laterality: N/A;  . SVD  2003   x 1 in [place]  . TUBAL LIGATION    . WISDOM TOOTH EXTRACTION      OB History    Gravida  3   Para  3   Term  1   Preterm  2   AB  0   Living  3     SAB  0   IAB  0   Ectopic  0   Multiple  0   Live Births  3            Home Medications    Prior to Admission medications   Medication Sig Start Date End Date Taking? Authorizing Provider  ACCU-CHEK GUIDE test strip USE 1 strip 2 TIMES DAILY BEFORE MEALS 04/23/20   [provider]  Accu-Chek Softclix Lancets lancets 2 (two) times daily. 04/24/20  [provider]  albuterol (PROVENTIL) (2.5 MG/3ML) 0.083% nebulizer solution Take 3 mLs (2.5 mg total) by nebulization every 6 (six) hours as needed for wheezing or shortness of breath. 08/14/20   Vanessa Kick, MD  alum & mag hydroxide-simeth [facility] MAXIMUM STRENGTH) 400-400-40 MG/5ML suspension Take 10 mLs by mouth every 6 (six) hours as needed for indigestion. 02/08/21   Loni Beckwith, PA-C  amoxicillin-clavulanate (AUGMENTIN) 875-125 MG tablet amoxicillin 875 mg-potassium clavulanate 125 mg tablet  Take 1 tablet every 12 hours by oral route.    [provider]  benzonatate (TESSALON PERLES) 100 MG capsule Take 1 capsule (100 mg total) by mouth 2 (two) times daily as needed for cough. 05/27/20 05/27/21  Rudolpho Sevin, NP  Blood Glucose Monitoring Suppl (ACCU-CHEK GUIDE) w/Device KIT See admin instructions. 04/23/20   [provider]  budesonide-formoterol  (SYMBICORT) 160-4.5 MCG/ACT inhaler Inhale 2 puffs into the lungs in the morning and at bedtime. 08/01/20   Faustino Congress, NP  EPINEPHrine 0.3 mg/0.3 mL IJ SOAJ injection SMARTSIG:1 Syringe(s) IM PRN 01/14/20   [provider]  EPINEPHrine 0.3 mg/0.3 mL IJ SOAJ injection Inject 0.3 mg into the muscle as needed for anaphylaxis. As needed for life-threatening allergic reactions 08/27/20   Valentina Shaggy, MD  esomeprazole (NEXIUM) 40 MG capsule Take 1 capsule (40 mg total) by mouth daily. 08/14/20   Vanessa Kick, MD  fluconazole (DIFLUCAN) 150 MG tablet Take 1 tablet (150 mg total) by mouth daily. Take one tablet now and one in 3 days if you are still having symptoms 03/17/21   Pearson Forster, NP  fluticasone (FLOVENT HFA) 220 MCG/ACT inhaler Inhale 2 puffs into the lungs in the morning and at bedtime. 10/15/20   Valentina Shaggy, MD  gabapentin (NEURONTIN) 800 MG tablet Take 800 mg by mouth 3 (three) times daily.    [provider]  HYDROcodone-acetaminophen (NORCO/VICODIN) 5-325 MG tablet Take 1 tablet by mouth every 6 (six) hours as needed for up to 3 doses for severe pain. 03/10/21   Raspet, Erin K, PA-C  ipratropium (ATROVENT) 0.03 % nasal spray Place 2 sprays into both nostrils every 12 (twelve) hours. 05/28/20   Lamptey, Myrene Galas, MD  levocetirizine (XYZAL) 5 MG tablet Take 1 tablet (5 mg total) by mouth every evening. 07/29/20   Volney American, PA-C  levonorgestrel (MIRENA, 52 MG,) 20 MCG/24HR IUD Mirena 20 mcg/24 hours (7 yrs) 52 mg intrauterine device  Take by intrauterine route.    [provider]  metoprolol succinate (TOPROL-XL) 25 MG 24 hr tablet Take 25 mg by mouth 2 (two) times daily. 07/29/20   [provider]  metroNIDAZOLE (FLAGYL) 500 MG tablet Take 1 tablet (500 mg total) by mouth 2 (two) times daily. 03/18/21   Lamptey, Myrene Galas, MD  montelukast (SINGULAIR) 10 MG tablet Take 1 tablet (10 mg total) by mouth at bedtime. 07/29/20   Volney American, PA-C  nicotine (NICODERM CQ - DOSED IN MG/24 HOURS) 14 mg/24hr patch Place 1 patch (14 mg total) onto the skin daily. 08/27/20   Valentina Shaggy, MD  Olopatadine HCl (PATADAY) 0.2 % SOLN Place 1 drop into both eyes daily as needed. 09/25/20   Valentina Shaggy, MD  promethazine (PHENERGAN) 12.5 MG tablet Take 1 tablet (12.5 mg total) by mouth every 6 (six) hours as needed for nausea or vomiting. 07/29/20   Volney American, PA-C  promethazine (PHENERGAN) 25 MG tablet Take 1 tablet (25 mg total)  by mouth every 6 (six) hours as needed for up to 3 days for nausea or vomiting. 06/13/20 06/16/20  Konrad Felix, PA-C  tiZANidine (ZANAFLEX) 4 MG tablet Take 4 mg by mouth every 6 (six) hours as needed for muscle spasms.    [provider]  XOPENEX HFA 45 MCG/ACT inhaler Inhale 2 puffs into the lungs every 4 (four) hours as needed for wheezing. 09/01/20   Valentina Shaggy, MD  Cetirizine HCl 10 MG CAPS Take 1 capsule (10 mg total) by mouth daily for 10 days. Patient not taking: Reported on 06/22/2019 02/11/19 08/18/19  Wieters, Elesa Hacker, PA-C  Dextromethorphan HBr 10 MG/5ML SYRP 10 ml qhs prn cough at night time. Patient not taking: Reported on 06/22/2019 02/04/19 08/18/19  Rodriguez-Southworth, Sunday Spillers, PA-C  fluticasone [facility]) 50 MCG/ACT nasal spray Place 1 spray into both nostrils daily. 05/27/20 05/28/20  Rudolpho Sevin, NP  norethindrone (AYGESTIN) 5 MG tablet Take by mouth. 05/07/20 05/28/20  [provider]  Topiramate ER (TROKENDI XR) 200 MG CP24 Take 1 capsule by mouth daily. 08/27/19 05/28/20  [provider]    Family History Family History  Problem Relation Age of Onset  . Cancer Mother   . CAD Mother   . Hypertension Sister   . Sickle cell trait Daughter   . Asthma Son   . Asthma Paternal Grandmother   . Pseudochol deficiency Neg Hx   . Malignant hyperthermia Neg Hx   . Anesthesia problems Neg Hx   . Hypotension Neg Hx      Social History Social History   Tobacco Use  . Smoking status: Current Every Day Smoker    Packs/day: 0.10    Years: 18.00    Pack years: 1.80    Types: Cigarettes  . Smokeless tobacco: Never Used  Vaping Use  . Vaping Use: Former  Substance Use Topics  . Alcohol use: Yes    Comment: Rare   . Drug use: Not Currently    Types: Marijuana     Allergies   Other, Shrimp extract allergy skin test, Shellfish-derived products, and Shrimp [shellfish allergy]   Review of Systems Review of Systems  Respiratory: Negative for apnea, cough, choking, chest tightness, shortness of breath, wheezing and stridor.   Cardiovascular: Positive for palpitations. Negative for chest pain and leg swelling.  Musculoskeletal:       Numbness and tingling L leg and arm  Neurological: Positive for dizziness. Negative for tremors, seizures, syncope, facial asymmetry, speech difficulty, weakness, light-headedness, numbness and headaches.     Physical Exam Triage Vital Signs ED Triage Vitals  Enc Vitals Group     BP 03/23/21 1706 112/77     Pulse Rate 03/23/21 1706 95     Resp 03/23/21 1706 20     Temp 03/23/21 1706 99.2 F (37.3 C)     Temp Source 03/23/21 1706 Oral     SpO2 03/23/21 1706 97 %     Weight --      Height --      Head Circumference --      Peak Flow --      Pain Score 03/23/21 1707 7     Pain Loc --      Pain Edu? --      Excl. in [place]? --    No data found.  Updated Vital Signs BP 112/77 (BP Location: Right Arm)   Pulse 95   Temp 99.2 F (37.3 C) (Oral)   Resp 20   LMP  (  LMP Unknown)   SpO2 97%   Visual Acuity Right Eye Distance:   Left Eye Distance:   Bilateral Distance:    Right Eye Near:   Left Eye Near:    Bilateral Near:     Physical Exam Vitals reviewed.  Constitutional:      Appearance: Normal appearance. She is not diaphoretic.  HENT:     Head: Normocephalic and atraumatic.     Mouth/Throat:     Mouth: Mucous membranes are moist.  Eyes:      Extraocular Movements: Extraocular movements intact.     Pupils: Pupils are equal, round, and reactive to light.  Cardiovascular:     Rate and Rhythm: Normal rate and regular rhythm.     Pulses:          Radial pulses are 2+ on the right side and 2+ on the left side.     Heart sounds: Normal heart sounds.  Pulmonary:     Effort: Pulmonary effort is normal.     Breath sounds: Normal breath sounds.  Abdominal:     Palpations: Abdomen is soft.     Tenderness: There is no abdominal tenderness. There is no guarding or rebound.  Musculoskeletal:     Right lower leg: No edema.     Left lower leg: No edema.  Skin:    General: Skin is warm.     Capillary Refill: Capillary refill takes less than 2 seconds.  Neurological:     General: No focal deficit present.     Mental Status: She is alert and oriented to person, place, and time.     Cranial Nerves: Cranial nerves are intact. No cranial nerve deficit.     Sensory: Sensation is intact.     Motor: Motor function is intact. No weakness.     Coordination: Coordination is intact. Romberg sign negative.     Gait: Gait is intact. Gait normal.     Comments: CN 2-12 grossly intact. Strength 5/5 in UEs and LEs. Sensation intact. Gait intact.   Psychiatric:        Mood and Affect: Mood normal.        Behavior: Behavior normal.        Thought Content: Thought content normal.        Judgment: Judgment normal.      UC Treatments / Results  Labs (all labs ordered are listed, but only abnormal results are displayed) Labs Reviewed - No data to display  EKG   Radiology No results found.  Procedures Procedures (including critical care time)  Medications Ordered in UC Medications - No data to display  Initial Impression / Assessment and Plan / UC Course  I have reviewed the triage vital signs and the nursing notes.  Pertinent labs & imaging results that were available during my care of the patient were reviewed by me and considered in my  medical decision making (see chart for details).     This patient is a 40 year old female presenting with palpitations, dizziness, left arm and leg numbness and tingling.  Benign neuro exam, low suspicion for CVA.  Afebrile, nontachycardic.  EKG NSR, unchanged from 08/2019 EKG.  HEART score 3 for current smoker, obesity, hypertension, prediabetes, and suspicious history. Cannot draw troponin given urgent care setting.  I am recommending this patient had to [facility] emergency room for further cardiac evaluation and management of her symptoms.  She is hemodynamically stable for transport and personal vehicle at this time.  Coding this visit  a Level 4 as this patient is headed to the ED for emergent evaluation of possibly life threatenintg condition. Also coding this visit a Level 4 due to acute illness with systemic symptoms (palpitations and dizziness); review of past notes (12/2016); order and interpretation of tests (EKG).     Final Clinical Impressions(s) / UC Diagnoses   Final diagnoses:  Heart palpitations  Dizziness  Numbness and tingling of left arm and leg     Discharge Instructions     -Head straight to [facility] emergency room for further evaluation and management of your chest pain, dizziness, left arm and leg numbness and tingling.  I am concerned that you are having a cardiac event, and I cannot fully evaluate this here, nor can I treat it here. This can be life altering or even deadly if not treated quickly.  Head straight to the emergency room.  If you develop worsening of symptoms on the way, stop and call 911 immediately    ED Prescriptions    None     PDMP not reviewed this encounter.   Hazel Sams, PA-C 03/23/21 1825

## 2021-03-23 NOTE — ED Notes (Signed)
Pt came up to NT and stated that her arm and leg were not tingling anymore and that she was feeling much better.

## 2021-03-23 NOTE — ED Triage Notes (Signed)
Pt presents today with c/o of "heart racing" with dizziness after walking a short distance. Denies chest pain.

## 2021-03-24 ENCOUNTER — Other Ambulatory Visit: Payer: Self-pay

## 2021-03-24 ENCOUNTER — Ambulatory Visit: Payer: Medicaid Other | Attending: Nurse Practitioner | Admitting: Physical Therapy

## 2021-03-24 ENCOUNTER — Encounter: Payer: Self-pay | Admitting: Physical Therapy

## 2021-03-24 DIAGNOSIS — R102 Pelvic and perineal pain: Secondary | ICD-10-CM | POA: Diagnosis present

## 2021-03-24 DIAGNOSIS — R252 Cramp and spasm: Secondary | ICD-10-CM

## 2021-03-24 DIAGNOSIS — R1084 Generalized abdominal pain: Secondary | ICD-10-CM | POA: Diagnosis present

## 2021-03-24 DIAGNOSIS — M6281 Muscle weakness (generalized): Secondary | ICD-10-CM | POA: Diagnosis not present

## 2021-03-24 NOTE — Therapy (Signed)
[facility] [address] [place], [place], [address] Phone: [phone]   Fax:  [phone]  Physical Therapy Treatment  Patient Details  Name: Penny [name] MRN: [id number] Date of Birth: 31-Jul-1981 Referring Provider (PT): Claris Gladden. Ouida Sills, NP   Encounter Date: 03/24/2021   PT End of Session - 03/24/21 0840    Visit Number 3    Date for PT Re-Evaluation 05/25/21    Authorization Type Medicaid    Authorization - Visit Number 2    Authorization - Number of Visits 3    PT Start Time 0800    PT Stop Time 0841    PT Time Calculation (min) 41 min    Activity Tolerance Patient tolerated treatment well;No increased pain    Behavior During Therapy WFL for tasks assessed/performed           Past Medical History:  Diagnosis Date  . ADHD (attention deficit hyperactivity disorder)   . Anxiety    no meds  . Asthma   . Chronic back pain   . Depression    no meds  . Essential hypertension    a. Dx 06/2016.  . Migraines    last one over 1 month ago  . Overweight   . Palpitations - Sinus Tachycardia    a. 12/2016 Echo: E f55-60%, no rwma, nl LA/RA sizes.  . Placenta previa    w/ 2 pregnancies.  . Pre-diabetes   . Preterm labor    c/s at 36 wks  . Scoliosis   . Sinus tachycardia     Past Surgical History:  Procedure Laterality Date  . CESAREAN SECTION  2010   x 1 at 36 wks in [place]  . CESAREAN SECTION  February 13, 2012  . CHROMOPERTUBATION  10/26/2015   Procedure: ATTEMPTED CHROMOPERTUBATION;  Surgeon: Crawford Givens, MD;  Location: [facility];  Service: Gynecology;;  . LAPAROSCOPIC LYSIS OF ADHESIONS  10/26/2015   Procedure: LAPAROSCOPIC LYSIS OF ADHESIONS;  Surgeon: Crawford Givens, MD;  Location: [facility];  Service: Gynecology;;  . LAPAROSCOPY N/A 10/26/2015   Procedure: LAPAROSCOPY OPERATIVE WITH REMOVAL OF MISPLACED FILSHE CLIP;  Surgeon: Crawford Givens, MD;  Location: [facility];  Service: Gynecology;   Laterality: N/A;  . SVD  2003   x 1 in [place]  . TUBAL LIGATION    . WISDOM TOOTH EXTRACTION      There were no vitals filed for this visit.   Subjective Assessment - 03/24/21 0805    Subjective Patient is using her muscle relaxers 50% less. No discomfort with vaginal exam for a yeat infection.    Patient Stated Goals reduce her pain    Currently in Pain? Yes    Pain Score 4     Pain Location Pelvis    Pain Orientation Right;Left    Pain Descriptors / Indicators Tingling    Pain Type Acute pain    Pain Onset More than a month ago    Pain Frequency Intermittent    Aggravating Factors  walking, hip flexion of right leg, increased weight onto right leg with walking    Pain Relieving Factors medication, heat    Multiple Pain Sites No              [facility] PT Assessment - 03/24/21 0001      Assessment   Medical Diagnosis R10.2 pelvic and perineal pain    Referring Provider (PT) Claris Gladden. Ouida Sills, NP    Onset Date/Surgical Date 11/05/20  Prior Therapy had pelvic floor PT in the past      Precautions   Precautions None      Restrictions   Weight Bearing Restrictions No      Home Environment   Living Environment Private residence      Prior Function   Level of Independence Independent    Leisure walking daily      Cognition   Overall Cognitive Status Within Functional Limits for tasks assessed      Posture/Postural Control   Posture/Postural Control Postural limitations    Posture Comments rib hump on the upper right thoracic      ROM / Strength   AROM / PROM / Strength AROM;PROM;Strength      AROM   Lumbar Flexion decreased by 25%    Lumbar - Right Side Bend full with pain on right lumbar    Lumbar - Right Rotation decreased by 25%    Lumbar - Left Rotation decreased by 25%      Strength   Overall Strength Comments needs tactile cues to engage the lower abdominals without holding her breath or lifting the upper ribacage    Right Hip ABduction 4/5    Right  Hip ADduction 4/5    Left Hip ABduction 5/5    Left Hip ADduction 5/5      Flexibility   Soft Tissue Assessment /Muscle Length yes    Quadriceps Thomas test  with -5 degrees hip extension on the right; -3 degrees on left      Palpation   SI assessment  left ilium is rotated post.    Palpation comment tenderness in the lower left quadrant, tender on bilateral iliopsoas with left > right ; tender on bilateral hip adductors with right tighter than left                      Pelvic Floor Special Questions - 03/24/21 0001    Pelvic Floor Internal Exam Patient confirms identification and approves PT to assess pelvic floor and treatment    Exam Type Vaginal    Palpation tenderness on the bil.  obturator internist, along the pubic bone,    Strength fair squeeze, definite lift    Strength # of seconds 8   9 times            [facility] Adult PT Treatment/Exercise - 03/24/21 0001      Lumbar Exercises: Stretches   Hip Flexor Stretch Right;Left;1 rep;30 seconds      Manual Therapy   Manual Therapy Internal Pelvic Floor    Internal Pelvic Floor manual mobilization to bilateral obturator internist, along the pubic bone,   when palpate the obturator pain down the left leg.                 PT Education - 03/24/21 0840    Education Details Access Code: [email or web address]    Person(s) Educated Patient    Methods Explanation;Demonstration;Verbal cues;Handout    Comprehension Returned demonstration;Verbalized understanding            PT Short Term Goals - 03/24/21 0807      PT SHORT TERM GOAL #1   Title pt to be I with intial HEP     Time 4    Period Weeks    Status Achieved             PT Long Term Goals - 03/24/21 0807      PT LONG TERM GOAL #1  Title independent with advanced HEP    Baseline continuing to learn    Time 12    Period Weeks    Status On-going      PT LONG TERM GOAL #2   Title understand ways to manage pain with meditation, breathing, ways to  think of pain    Baseline continuing to learn new methods    Time 12    Period Weeks    Status On-going      PT LONG TERM GOAL #3   Title able to brace her abdomen during daily tasks to reduce the pain and improve her function with abdominal pain not higher than 1-2/10    Baseline pain level 6/10    Time 12    Period Weeks    Status On-going      PT LONG TERM GOAL #4   Title able to start a walking program for 15-30 mintues a day due to pain decrease </= 3/10    Baseline pain level 3/10    Time 12    Period Weeks    Status On-going      PT LONG TERM GOAL #5   Title Marinoff score </= 1/3 and pain with penile penetration vaginally decreased to </= 2/10 due to reduction of her trigger points    Baseline not having intercourse with her partner and no in the unforseen future    Time 12    Period Weeks    Status Deferred                 Plan - 03/24/21 0841    Clinical Impression Statement Patient has increased bilateral hip abduction and adduction. Patient pain level has decreased from 6/10 to 4/10. Patient is not having intercourse with her partner so the goal is deferred. Patient had no pain with vaginal exam for yeast infection for the first time. Patient has positive test on bilateral hips. She has tenderness located in bilateral psoas, left lower quadrant, bilateral hip adductors, bilateral obturator internist, and pubic bone. She has limited lumbar ROM by 25%. Pelvic floor strength is 3/10 and holding for 8 seconds. Patient still has difficutly with walking especially with placing her full weight on the right leg. Patient has difficulty with abdominal bracing with holding her breath and lifting the upper rib cage. Patient will benefit from skilled therapy to improve function and decrease pain.    Personal Factors and Comorbidities Comorbidity 2;Sex;Social Background;Fitness;Time since onset of injury/illness/exacerbation    Comorbidities C-section 2x; ADHD     Examination-Activity Limitations Lift;Locomotion Level    Examination-Participation Restrictions Interpersonal Relationship    Stability/Clinical Decision Making Stable/Uncomplicated    Rehab Potential Good    PT Frequency 1x / week    PT Duration 12 weeks    PT Treatment/Interventions Biofeedback;Cryotherapy;Electrical Stimulation;Iontophoresis 4mg /ml Dexamethasone;Moist Heat;Ultrasound;Therapeutic activities;Therapeutic exercise;Manual techniques;Patient/family education;Neuromuscular re-education;Dry needling;Spinal Manipulations    PT Next Visit Plan internal work to the cervix and left side of the vaginal canal; hip flexor stretches; core engagement; hip strength especially the gluteals; work on left lumbar and psoas    PT Home Exercise Plan Access Code: [email or web address]    Consulted and Agree with Plan of Care Patient           Patient will benefit from skilled therapeutic intervention in order to improve the following deficits and impairments:  Decreased activity tolerance,Decreased strength,Increased fascial restricitons,Pain,Decreased mobility,Increased muscle spasms,Decreased range of motion  Visit Diagnosis: Muscle weakness (generalized)  Cramp and spasm  Pelvic pain  Generalized abdominal pain     Problem List Patient Active Problem List   Diagnosis Date Noted  . Moderate persistent asthma with acute exacerbation 08/01/2020  . Uterine fibroid 01/23/2016  . Right groin pain 01/23/2016  . History of C-section 01/23/2016  . Migraines   . Anxiety   . Depression     Earlie Counts, PT 03/24/21 12:35 PM   [facility] [address], [place], [place], [address] Phone: [phone]   Fax:  [phone]  Name: Penny [name] MRN: [id number] Date of Birth: [birth year]

## 2021-03-24 NOTE — Congregational Nurse Program (Signed)
  Dept: [place] Nurse Program Note  Date of Encounter: 03/24/2021   Met with client today at shelter this afternoon.  Client reports that she had an episode of heart racing and tingling sensation in left arm on yesterday that sent her to Urgent Care and then to ER.  Reports that everything checked out alright and she was sent home.  Nurse reviewed chart in [facility].  Client reports that she has felt fine today, just a slight headache due to not eating.  B/P 132-84 HR-88 and regular.  No numbness or tingling today. Client has been following up on leads for permanent housing.  Had to go to cort today regarding an eviction, case was dismissed.  Shelter Director working with client to assist with housing. Nurse will follow up with client on next week.  Edgerrin Correia D. [name] MSN, RN Congregational Nurse [place] [phone]  Past Medical History: Past Medical History:  Diagnosis Date  . ADHD (attention deficit hyperactivity disorder)   . Anxiety    no meds  . Asthma   . Chronic back pain   . Depression    no meds  . Essential hypertension    a. Dx 06/2016.  . Migraines    last one over 1 month ago  . Overweight   . Palpitations - Sinus Tachycardia    a. 12/2016 Echo: E f55-60%, no rwma, nl LA/RA sizes.  . Placenta previa    w/ 2 pregnancies.  . Pre-diabetes   . Preterm labor    c/s at 36 wks  . Scoliosis   . Sinus tachycardia     Encounter Details:  CNP Questionnaire - 03/24/21 1710      Questionnaire   Do you give verbal consent to treat you today? Yes    Visit Setting Other    Location Patient Served At [facility]    Patient Status Homeless    Medical Provider Yes    Insurance Medicaid    Intervention Educate;Assess (including screenings)    Housing/Utilities No permanent housing    Transportation --   Client has transportation   Chiropractor --   Client feels safe here at [facility] --   Client has no food insecurities   Medication --   Client  has no medication insecurities   Referrals Other;PCP - other provider   Referred to Emotional Support Animal to help with locating her lost dogs.   Screening Referrals --   No referrals needed   ED Visit Averted Yes    Life-Saving Intervention Made Yes

## 2021-03-24 NOTE — Congregational Nurse Program (Signed)
  Dept: [place] Nurse Program Note  Date of Encounter: 03/16/2021   Met with client this afternoon at [facility].  Client reports that she had a panic attack on yesterday late in the evening. States she was able to calm herself and also helped to use her inhaler.  Reports that she is continuing to take amoxicillin ordered by her primary care doctor for the bump she had behind her ear.. Swelling has gone down, no pain on palpation, no redness noted by nurse.  She completed the pain medication that had been given .  Nurse checked clients B/P 124/80 no complaints of headache today. Nurse will follow up with client on next week when she returns to the shelter.  Demian Maisel D. [name] MSN, RN Congregational Nurse [place] [phone]  Past Medical History: Past Medical History:  Diagnosis Date  . ADHD (attention deficit hyperactivity disorder)   . Anxiety    no meds  . Asthma   . Chronic back pain   . Depression    no meds  . Essential hypertension    a. Dx 06/2016.  . Migraines    last one over 1 month ago  . Overweight   . Palpitations - Sinus Tachycardia    a. 12/2016 Echo: E f55-60%, no rwma, nl LA/RA sizes.  . Placenta previa    w/ 2 pregnancies.  . Pre-diabetes   . Preterm labor    c/s at 36 wks  . Scoliosis   . Sinus tachycardia     Encounter Details:  CNP Questionnaire - 03/16/21 1626      Questionnaire   Do you give verbal consent to treat you today? Yes    Visit Setting Other    Location Patient Served [facility]    Patient Status Homeless    Medical Provider Yes    Insurance Medicaid    Intervention Educate;Assess (including screenings)    Housing/Utilities No permanent housing    Transportation --   Client has transportation   Chiropractor --   Client feels safe here at [facility] --   Client has no food insecurities   Medication --   Client has no medication insecurities   Referrals Other;PCP - other provider   Referred to  Emotional Support Animal to help with locating her lost dogs.   Screening Referrals --   No referrals needed   ED Visit Averted Yes    Life-Saving Intervention Made Yes

## 2021-03-24 NOTE — Patient Instructions (Signed)
Access Code: [email or web address] URL: [email or web address] Date: 03/24/2021 Prepared by: Earlie Counts  Exercises Supine Bridge - 1 x daily - 7 x weekly - 1 sets - 10 reps Supine Piriformis Stretch with Leg Straight - 1 x daily - 7 x weekly - 1 sets - 2 reps - 30 sec hold Half Kneeling Hip Flexor Stretch with Sidebend - 1 x daily - 7 x weekly - 1 sets - 2 reps - 30 hold [facility] [address] [place], [place] [address] Phone # [phone] Fax [phone]

## 2021-03-30 NOTE — Congregational Nurse Program (Signed)
  Dept: [place] Nurse Program Note  Date of Encounter: 03/30/2021   Client came by to see nurse today for B/P check.  Reports doing okay.  Continues to look for employment.  States it's difficult to find something that doesn't tax her physically.  Has applied for disability once but know this can take a while.  She is hoping to hear back from a nursing home she applied with where she only has to open the door and light cigarettes for residents. She feels this is something that she can do as she smokes herself.Job has to be on the bus line as she has no transportation. Waiting for her past landlord to let her know what her balance owed is so that she can pay it and move forward with looking for another place.  She has currently only applied for one place and was denied. Reports taking medication for B/P in the late evening.  B/P today is 130/88, client is asymptomatic. Nurse instructed on stress reducing exercising such as deep breathing and praying.  Client receptive to this.  She also has a therapist who comes to the shelter to meet with her and this helps her as well.  Nurse will recheck B/P in one week, client will take medication.  Penny Wyble D. [name] MSN, RN Congregational Nurse [place] [phone]  Past Medical History: Past Medical History:  Diagnosis Date  . ADHD (attention deficit hyperactivity disorder)   . Anxiety    no meds  . Asthma   . Chronic back pain   . Depression    no meds  . Essential hypertension    a. Dx 06/2016.  . Migraines    last one over 1 month ago  . Overweight   . Palpitations - Sinus Tachycardia    a. 12/2016 Echo: E f55-60%, no rwma, nl LA/RA sizes.  . Placenta previa    w/ 2 pregnancies.  . Pre-diabetes   . Preterm labor    c/s at 36 wks  . Scoliosis   . Sinus tachycardia     Encounter Details:  CNP Questionnaire - 03/30/21 1600      Questionnaire   Do you give verbal consent to treat you today? Yes    Visit Setting  Other    Location Patient Served At [name]    Patient Status Homeless    Medical Provider Yes    Insurance Medicaid    Intervention Educate;Assess (including screenings)    Housing/Utilities No permanent housing    Transportation Need transportation assistance   Client has transportation   Chiropractor --   Client feels safe here at [facility] --   Client has no food insecurities   Medication --   Client has no medication insecurities   Referrals Other;PCP - other provider   Referred to Emotional Support Animal to help with locating her lost dogs.   Screening Referrals --   No referrals needed   ED Visit Averted Yes    Life-Saving Intervention Made Yes

## 2021-04-01 ENCOUNTER — Other Ambulatory Visit: Payer: Self-pay

## 2021-04-01 ENCOUNTER — Ambulatory Visit (INDEPENDENT_AMBULATORY_CARE_PROVIDER_SITE_OTHER): Payer: Medicaid Other | Admitting: Allergy

## 2021-04-01 DIAGNOSIS — J4540 Moderate persistent asthma, uncomplicated: Secondary | ICD-10-CM

## 2021-04-07 ENCOUNTER — Telehealth: Payer: Self-pay | Admitting: *Deleted

## 2021-04-07 ENCOUNTER — Ambulatory Visit: Payer: Medicaid Other | Admitting: Family Medicine

## 2021-04-07 ENCOUNTER — Other Ambulatory Visit: Payer: Self-pay

## 2021-04-07 ENCOUNTER — Ambulatory Visit (INDEPENDENT_AMBULATORY_CARE_PROVIDER_SITE_OTHER): Payer: Medicaid Other | Admitting: *Deleted

## 2021-04-07 ENCOUNTER — Telehealth: Payer: Self-pay | Admitting: Family Medicine

## 2021-04-07 ENCOUNTER — Encounter: Payer: Self-pay | Admitting: Family Medicine

## 2021-04-07 VITALS — BP 134/80 | HR 86 | Temp 97.3°F | Resp 16 | Ht 63.0 in | Wt 202.4 lb

## 2021-04-07 DIAGNOSIS — J302 Other seasonal allergic rhinitis: Secondary | ICD-10-CM

## 2021-04-07 DIAGNOSIS — J3089 Other allergic rhinitis: Secondary | ICD-10-CM | POA: Diagnosis not present

## 2021-04-07 DIAGNOSIS — K219 Gastro-esophageal reflux disease without esophagitis: Secondary | ICD-10-CM

## 2021-04-07 DIAGNOSIS — Z23 Encounter for immunization: Secondary | ICD-10-CM

## 2021-04-07 DIAGNOSIS — T7800XA Anaphylactic reaction due to unspecified food, initial encounter: Secondary | ICD-10-CM

## 2021-04-07 DIAGNOSIS — Z720 Tobacco use: Secondary | ICD-10-CM

## 2021-04-07 DIAGNOSIS — H1013 Acute atopic conjunctivitis, bilateral: Secondary | ICD-10-CM

## 2021-04-07 DIAGNOSIS — J4540 Moderate persistent asthma, uncomplicated: Secondary | ICD-10-CM | POA: Diagnosis not present

## 2021-04-07 DIAGNOSIS — H1010 Acute atopic conjunctivitis, unspecified eye: Secondary | ICD-10-CM

## 2021-04-07 MED ORDER — EPINEPHRINE 0.3 MG/0.3ML IJ SOAJ: 0.3000 mg | Freq: Once | INTRAMUSCULAR | 1 refills | Status: AC

## 2021-04-07 MED ORDER — LEVALBUTEROL TARTRATE 45 MCG/ACT IN AERO: INHALATION_SPRAY | RESPIRATORY_TRACT | 1 refills | Status: DC

## 2021-04-07 MED ORDER — MONTELUKAST SODIUM 10 MG PO TABS: 10.0000 mg | ORAL_TABLET | Freq: Every day | ORAL | 5 refills | Status: DC

## 2021-04-07 MED ORDER — LEVOCETIRIZINE DIHYDROCHLORIDE 5 MG PO TABS: 5.0000 mg | ORAL_TABLET | Freq: Every evening | ORAL | 5 refills | Status: DC

## 2021-04-07 MED ORDER — OLOPATADINE HCL 0.2 % OP SOLN: 1.0000 [drp] | Freq: Every day | OPHTHALMIC | 5 refills | Status: DC | PRN

## 2021-04-07 MED ORDER — BUDESONIDE-FORMOTEROL FUMARATE 160-4.5 MCG/ACT IN AERO: INHALATION_SPRAY | RESPIRATORY_TRACT | 1 refills | Status: DC

## 2021-04-07 NOTE — Patient Instructions (Addendum)
Asthma Set an alarm on your phone (for twice a day) to help you remember to take your medications.  Continue montelukast 10 mg once a day to prevent cough or wheeze Continue Symbicort 160-2 puffs twice a day with a spacer to prevent cough or wheeze Continue Xopenex once every 4-6 hours as needed for cough or wheeze Continue Xolair injections 375 mg once every 2 weeks and have access to an epinephrine autoinjector set  Allergic rhinitis Continue allergen avoidance measures directed toward grass pollen, weed pollen, ragweed pollen, tree pollen, mold, cat, dog, and cockroach Continue montelukast 10 mg once a day as listed above Continue Xyzal 5 mg once a day as needed for runny nose or itch Consider saline nasal rinses as needed for nasal symptoms. Use this before any medicated nasal sprays for best result  Allergic conjunctivitis Begin Pataday eyedrops 1 drop in each eye once a day as needed for red or itchy eyes  Reflux Continue dietary and lifestyle modifications as listed below Continue Nexium 40 mg once a day as previously prescribed  Food allergy Continue to avoid shellfish. In case of an allergic reaction, take Benadryl 50 mg  every 4 hours, and if life-threatening symptoms occur, inject with EpiPen 0.3 mg.  Tobacco Continue to cut down or quit.  Written materials diet from Up-To-Date  COVID vaccine You have received your [place] booster vaccine at today's visit.   Call the clinic if this treatment plan is not working well for you  Follow up in 4 months or sooner if needed.   Lifestyle Changes for Controlling GERD When you have GERD, stomach acid feels as if it's backing up toward your mouth. Whether or not you take medication to control your GERD, your symptoms can often be improved with lifestyle changes.   Raise Your Head  Reflux is more likely to strike when you're lying down flat, because stomach fluid can  flow backward more easily. Raising the head of your  bed 4-6 inches can help. To do this:  Slide blocks or books under the legs at the head of your bed. Or, place a wedge under  the mattress. Many foam stores can make a suitable wedge for you. The wedge  should run from your waist to the top of your head.  Don't just prop your head on several pillows. This increases pressure on your  stomach. It can make GERD worse.  Watch Your Eating Habits Certain foods may increase the acid in your stomach or relax the lower esophageal sphincter, making GERD more likely. It's best to avoid the following:  Coffee, tea, and carbonated drinks (with and without caffeine)  Fatty, fried, or spicy food  Mint, chocolate, onions, and tomatoes  Any other foods that seem to irritate your stomach or cause you pain  Relieve the Pressure  Eat smaller meals, even if you have to eat more often.  Don't lie down right after you eat. Wait a few hours for your stomach to empty.  Avoid tight belts and tight-fitting clothes.  Lose excess weight.  Tobacco and Alcohol  Avoid smoking tobacco and drinking alcohol. They can make GERD symptoms worse.  Reducing Pollen Exposure The American Academy of Allergy, Asthma and Immunology suggests the following steps to reduce your exposure to pollen during allergy seasons. 1. Do not hang sheets or clothing out to dry; pollen may collect on these items. 2. Do not mow lawns or spend time around freshly cut grass; mowing stirs up pollen. 3. Keep  windows closed at night.  Keep car windows closed while driving. 4. Minimize morning activities outdoors, a time when pollen counts are usually at their highest. 5. Stay indoors as much as possible when pollen counts or humidity is high and on windy days when pollen tends to remain in the air longer. 6. Use air conditioning when possible.  Many air conditioners have filters that trap the pollen spores. 7. Use a HEPA room air filter to remove pollen form the indoor air you  breathe.  Control of Dog or Cat Allergen Avoidance is the best way to manage a dog or cat allergy. If you have a dog or cat and are allergic to dog or cats, consider removing the dog or cat from the home. If you have a dog or cat but don't want to find it a new home, or if your family wants a pet even though someone in the household is allergic, here are some strategies that may help keep symptoms at bay:  8. Keep the pet out of your bedroom and restrict it to only a few rooms. Be advised that keeping the dog or cat in only one room will not limit the allergens to that room. 9. Don't pet, hug or kiss the dog or cat; if you do, wash your hands with soap and water. 10. High-efficiency particulate air (HEPA) cleaners run continuously in a bedroom or living room can reduce allergen levels over time. 11. Regular use of a high-efficiency vacuum cleaner or a central vacuum can reduce allergen levels. 12. Giving your dog or cat a bath at least once a week can reduce airborne allergen.  Control of Mold Allergen Mold and fungi can grow on a variety of surfaces provided certain temperature and moisture conditions exist.  Outdoor molds grow on plants, decaying vegetation and soil.  The major outdoor mold, Alternaria and Cladosporium, are found in very high numbers during hot and dry conditions.  Generally, a late Summer - Fall peak is seen for common outdoor fungal spores.  Rain will temporarily lower outdoor mold spore count, but counts rise rapidly when the rainy period ends.  The most important indoor molds are Aspergillus and Penicillium.  Dark, humid and poorly ventilated basements are ideal sites for mold growth.  The next most common sites of mold growth are the bathroom and the kitchen.  Outdoor [employer] 13. Use air conditioning and keep windows closed 14. Avoid exposure to decaying vegetation. 15. Avoid leaf raking. 16. Avoid grain handling. 17. Consider wearing a face mask if working in moldy  areas.  Indoor Mold Control 1. Maintain humidity below 50%. 2. Clean washable surfaces with 5% bleach solution. 3. Remove sources e.g. Contaminated carpets.   Control of Cockroach Allergen Cockroach allergen has been identified as an important cause of acute attacks of asthma, especially in urban settings.  There are fifty-five species of cockroach that exist in the [place], however only three, the [place], [place] species produce allergen that can affect patients with Asthma.  Allergens can be obtained from fecal particles, egg casings and secretions from cockroaches.    1. Remove food sources. 2. Reduce access to water. 3. Seal access and entry points. 4. Spray runways with 0.5-1% Diazinon or Chlorpyrifos 5. Blow boric acid power under stoves and refrigerator. 6. Place bait stations (hydramethylnon) at feeding sites.

## 2021-04-07 NOTE — Telephone Encounter (Signed)
Patient called and stated that around 11 she sat down and had an asthma attack. She used 4 puffs of her Xopenex and it got better and she feels better. She states that she is having some intermittent itching at the injection site where she received her Pfizer Booster in the Left arm. She states that she is not feeling anxious at the time of the of the asthma attack. She would prefer to come back in and be evaluated but she does not have a ride currently. If she is able to fine a ride she will come back in and be evaluated. I did remind the patient of the time that we are closed for lunch but to please call and keep [place] posted.

## 2021-04-07 NOTE — Telephone Encounter (Signed)
Patient returned to the clinic about 4:30 stating that shortly after receiving [place] and leaving the clinic she began to experience symptoms including shortness of breath, lethargy, and feeling like her heart was racing.  She called the clinic to let us know that she was feeling a little bit better after taking Xopenex.  Upon return to the clinic, she is walking extremely slow and reports shortness of breath and feeling like her heart is racing.  Physical exam and vital signs remained stable.  Patient received Xopenex via nebulizer 1 time with resolution of shortness of breath and heart racing.  Vital signs remained within normal limits.  Patient given anticipatory guidance regarding when to call 911 or go to the emergency department.  Patient was provided with an on-call phone number for emergency.  Patient was provided with Xopenex solution for nebulizer and nebulizer tubing.  She is alert and oriented during the visit and answering questions appropriately.  We will call her if in the morning to check on her and she will follow-up in the clinic on Friday with Dr. Maudie Mercury

## 2021-04-07 NOTE — Progress Notes (Signed)
[place] [address] Dept: [phone]  FOLLOW UP NOTE  Patient ID: Penny [name], female    DOB: [birth year]  Age: 40 y.o. MRN: [id number] Date of Office Visit: 04/07/2021  Assessment  Chief Complaint: Asthma (Past couple of days starting Monday has been having wheezing and coughing at night. Mostly when she lays down. )  HPI Penny [name] is a 40 year old female who presents to the clinic for follow-up visit.  She was last seen in this clinic on 09/22/2020 by Dr. Ernst Bowler for evaluation of asthma, allergic rhinitis, allergic conjunctivitis, reflux, food allergy to shellfish, and tobacco use.  At today's visit, she reports her asthma has been moderately well controlled with shortness of breath with vigorous activity, intermittent wheeze which occurs occasionally in the daytime and increases during the nighttime, and intermittent dry cough occurring mostly in the daytime.  She does report that the symptoms are getting better over the last few days.  She reports that she does not take her asthma medications on a regular basis as she frequently "forgets" to take her medications.  She continues montelukast 10 mg 1-2 times a week, Symbicort 160-2 puffs when her asthma is acting up, Spiriva 1.25 mcg - 2 puffs when her asthma is acting up, and Xopenex about 3 times a month.  She continues Xolair 375 mg once every 2 weeks with no local reaction.  She reports a significant decrease in her symptoms of asthma while continuing on Xolair injections.  Allergic rhinitis is reported as moderately well controlled with symptoms including sneezing and copious postnasal drainage.  She continues Xyzal 5 mg as needed and is not currently using nasal saline rinse or nasal steroid spray.  Allergic conjunctivitis is reported as poorly controlled with frequent ocular pruritus for which she is not currently using any medical intervention.  Reflux is reported as moderately well controlled with  heartburn occurring several days a week for which she occasionally takes Nexium 40 mg.  She continues to avoid shellfish with no accidental ingestion or EpiPen use since her last visit to this clinic.  She continues to smoke 3 packs of cigarettes a week, however, she reports she is trying to cut down on cigarette use.  She has previously received 2 [place] vaccines and is interested in receiving a [employer] COVID vaccine booster at today's visit.  Her current medications are listed in the chart.  Drug Allergies:  Allergies  Allergen Reactions  . Other Itching and Swelling    Itching and swelling in mouth and face  . Shrimp Extract Allergy Skin Test Swelling  . Shellfish-Derived Products   . Shrimp [Shellfish Allergy] Itching and Swelling    Itching and swelling in mouth and face    Physical Exam: BP 134/80   Pulse 86   Temp (!) 97.3 F (36.3 C)   Resp 16   Ht 5\' 3"  (1.6 m)   Wt 202 lb 6.4 oz (91.8 kg)   LMP  (LMP Unknown)   SpO2 96%   BMI 35.85 kg/m    Physical Exam HENT:     Head: Normocephalic and atraumatic.     Right Ear: Tympanic membrane normal.     Left Ear: Tympanic membrane normal.     Nose:     Comments: Bilateral nares edematous and pale with clear nasal drainage noted.  External nasal crease noted.  Pharynx normal.  Ears normal.  Eyes normal.    Mouth/Throat:     Pharynx: Oropharynx is  clear.  Eyes:     Conjunctiva/sclera: Conjunctivae normal.  Cardiovascular:     Rate and Rhythm: Normal rate and regular rhythm.     Heart sounds: Normal heart sounds. No murmur heard.   Pulmonary:     Effort: Pulmonary effort is normal.     Breath sounds: Normal breath sounds.     Comments: Lungs clear to auscultation Musculoskeletal:        General: Normal range of motion.     Cervical back: Normal range of motion and neck supple.  Skin:    General: Skin is warm and dry.  Neurological:     Mental Status: She is oriented to person, place, and time.  Psychiatric:         Mood and Affect: Mood normal.        Behavior: Behavior normal.        Thought Content: Thought content normal.        Judgment: Judgment normal.     Diagnostics: FVC 2.34, FEV1 1.95.  Predicted FVC 2.97, predicted FEV1 2.45.  Spirometry indicates mild restriction.  Previous spirometry reading indicates moderate restriction.  Assessment and Plan: 1. Moderate persistent asthma without complication   2. Seasonal and perennial allergic rhinitis   3. Seasonal allergic conjunctivitis   4. Allergy with anaphylaxis due to food   5. Tobacco use   6. Encounter for immunization   7. Gastroesophageal reflux disease, unspecified whether esophagitis present     Meds ordered this encounter  Medications  . budesonide-formoterol (SYMBICORT) 160-4.5 MCG/ACT inhaler    Sig: 2 puffs twice a day with a spacer to prevent cough or wheeze    Dispense:  1 each    Refill:  1  . levalbuterol (XOPENEX HFA) 45 MCG/ACT inhaler    Sig: Inhale 1 puff every 4-6 hours as needed for cough or wheeze.    Dispense:  15 g    Refill:  1  . levocetirizine (XYZAL) 5 MG tablet    Sig: Take 1 tablet (5 mg total) by mouth every evening.    Dispense:  30 tablet    Refill:  5  . EPINEPHrine 0.3 mg/0.3 mL IJ SOAJ injection    Sig: Inject 0.3 mg into the muscle once for 1 dose.    Dispense:  1 each    Refill:  1  . montelukast (SINGULAIR) 10 MG tablet    Sig: Take 1 tablet (10 mg total) by mouth at bedtime.    Dispense:  30 tablet    Refill:  5  . Olopatadine HCl (PATADAY) 0.2 % SOLN    Sig: Place 1 drop into both eyes daily as needed.    Dispense:  2.5 mL    Refill:  5    Patient Instructions  Asthma Set an alarm on your phone (for twice a day) to help you remember to take your medications.  Continue montelukast 10 mg once a day to prevent cough or wheeze Continue Symbicort 160-2 puffs twice a day with a spacer to prevent cough or wheeze Continue Xopenex once every 4-6 hours as needed for cough or  wheeze Continue Xolair injections 375 mg once every 2 weeks and have access to an epinephrine autoinjector set  Allergic rhinitis Continue allergen avoidance measures directed toward grass pollen, weed pollen, ragweed pollen, tree pollen, mold, cat, dog, and cockroach Continue montelukast 10 mg once a day as listed above Continue Xyzal 5 mg once a day as needed for runny nose or itch Consider  saline nasal rinses as needed for nasal symptoms. Use this before any medicated nasal sprays for best result  Allergic conjunctivitis Begin Pataday eyedrops 1 drop in each eye once a day as needed for red or itchy eyes  Reflux Continue dietary and lifestyle modifications as listed below Continue Nexium 40 mg once a day as previously prescribed  Food allergy Continue to avoid shellfish. In case of an allergic reaction, take Benadryl 50 mg  every 4 hours, and if life-threatening symptoms occur, inject with EpiPen 0.3 mg.  Tobacco Continue to cut down or quit.  Written materials diet from Up-To-Date  COVID vaccine You have received your [place] booster vaccine at today's visit.   Call the clinic if this treatment plan is not working well for you  Follow up in 3 months or sooner if needed.   Return in about 3 months (around 07/08/2021), or if symptoms worsen or fail to improve.    Thank you for the opportunity to care for this patient.  Please do not hesitate to contact me with questions.  Gareth [name], FNP Allergy and [place] [facility] [place]

## 2021-04-07 NOTE — Progress Notes (Signed)
   Covid-19 Vaccination Clinic  Name:  Penny [name]    MRN: [id number] DOB: 08-20-1981  04/07/2021  [name] was observed post Covid-19 immunization for 30 minutes based on pre-vaccination screening without incident. She was provided with Vaccine Information Sheet and instruction to access the V-Safe system.   [name] was instructed to call 911 with any severe reactions post vaccine: [place] Kitchen Difficulty breathing  . Swelling of face and throat  . A fast heartbeat  . A bad rash all over body  . Dizziness and weakness

## 2021-04-08 ENCOUNTER — Encounter: Payer: Self-pay | Admitting: Physical Therapy

## 2021-04-08 ENCOUNTER — Ambulatory Visit: Payer: Medicaid Other | Admitting: Physical Therapy

## 2021-04-08 DIAGNOSIS — M6281 Muscle weakness (generalized): Secondary | ICD-10-CM | POA: Diagnosis not present

## 2021-04-08 DIAGNOSIS — R252 Cramp and spasm: Secondary | ICD-10-CM

## 2021-04-08 DIAGNOSIS — R1084 Generalized abdominal pain: Secondary | ICD-10-CM

## 2021-04-08 DIAGNOSIS — R102 Pelvic and perineal pain: Secondary | ICD-10-CM

## 2021-04-08 NOTE — Therapy (Signed)
[facility] [address], [place], [place], [address] Phone: [phone]   Fax:  [phone]  Physical Therapy Treatment  Patient Details  Name: Penny [name] MRN: [id number] Date of Birth: 12/02/1980 Referring Provider (PT): Claris Gladden. Ouida Sills, NP   Encounter Date: 04/08/2021   PT End of Session - 04/08/21 0842    Visit Number 4    Authorization Type Medicaid    Authorization Time Period 5/26-8/17    Authorization - Visit Number 1    Authorization - Number of Visits 12    PT Start Time 0800    PT Stop Time 0840    PT Time Calculation (min) 40 min    Activity Tolerance Patient tolerated treatment well;No increased pain    Behavior During Therapy WFL for tasks assessed/performed           Past Medical History:  Diagnosis Date  . ADHD (attention deficit hyperactivity disorder)   . Anxiety    no meds  . Asthma   . Chronic back pain   . Depression    no meds  . Essential hypertension    a. Dx 06/2016.  . Migraines    last one over 1 month ago  . Overweight   . Palpitations - Sinus Tachycardia    a. 12/2016 Echo: E f55-60%, no rwma, nl LA/RA sizes.  . Placenta previa    w/ 2 pregnancies.  . Pre-diabetes   . Preterm labor    c/s at 36 wks  . Recurrent upper respiratory infection (URI)   . Scoliosis   . Sinus tachycardia     Past Surgical History:  Procedure Laterality Date  . CESAREAN SECTION  2010   x 1 at 36 wks in [place]  . CESAREAN SECTION  February 13, 2012  . CHROMOPERTUBATION  10/26/2015   Procedure: ATTEMPTED CHROMOPERTUBATION;  Surgeon: Crawford Givens, MD;  Location: [facility];  Service: Gynecology;;  . LAPAROSCOPIC LYSIS OF ADHESIONS  10/26/2015   Procedure: LAPAROSCOPIC LYSIS OF ADHESIONS;  Surgeon: Crawford Givens, MD;  Location: [facility];  Service: Gynecology;;  . LAPAROSCOPY N/A 10/26/2015   Procedure: LAPAROSCOPY OPERATIVE WITH REMOVAL OF MISPLACED FILSHE CLIP;  Surgeon: Crawford Givens, MD;   Location: [facility];  Service: Gynecology;  Laterality: N/A;  . SVD  2003   x 1 in [place]  . TUBAL LIGATION    . WISDOM TOOTH EXTRACTION      There were no vitals filed for this visit.   Subjective Assessment - 04/08/21 0807    Subjective I had a booster shot yesterday.I see the doctor in June or July for my results of ultrasound. Therapy has helped by 50% since therapy started. I still have issues with walking alot and moving a certain way.    Patient Stated Goals reduce her pain    Currently in Pain? Yes    Pain Score 4     Pain Location Pelvis    Pain Orientation Right;Left    Pain Descriptors / Indicators Dull    Pain Type Acute pain    Pain Onset More than a month ago    Pain Frequency Intermittent    Aggravating Factors  walking, hip flexion of right leg, increased weight onto right leg with walking    Pain Relieving Factors medication, heat    Multiple Pain Sites No  [place] Adult PT Treatment/Exercise - 04/08/21 0001      Lumbar Exercises: Aerobic   Nustep 7 minutes level 1 using right  arm due to left arm hurting from booster while assessing patient      Lumbar Exercises: Supine   Bridge 15 reps      Manual Therapy   Manual Therapy Myofascial release;Soft tissue mobilization    Soft tissue mobilization circular massage to abdominal to improve perstalic motion to move gas throungh intestines with greater ease    Myofascial Release fascial release to the lower abdomen to release around the rectal area, along the bladder, along the sac of douglas, the left upper abdominal area, lifting the intestines off the bladder                  PT Education - 04/08/21 0840    Education Details educated patient n abdominal massage    Person(s) Educated Patient    Methods Explanation;Demonstration;Handout    Comprehension Returned demonstration;Verbalized understanding            PT Short Term Goals - 03/24/21 0807      PT  SHORT TERM GOAL #1   Title pt to be I with intial HEP     Time 4    Period Weeks    Status Achieved             PT Long Term Goals - 04/08/21 0816      PT LONG TERM GOAL #1   Title independent with advanced HEP    Baseline continuing to learn    Time 12    Period Weeks    Status On-going      PT LONG TERM GOAL #2   Title understand ways to manage pain with meditation, breathing, ways to think of pain    Baseline working on meditation    Time 12    Period Weeks    Status On-going      PT LONG TERM GOAL #3   Title able to brace her abdomen during daily tasks to reduce the pain and improve her function with abdominal pain not higher than 1-2/10    Baseline pain level 6/10    Time 12    Period Weeks    Status On-going      PT LONG TERM GOAL #4   Title able to start a walking program for 15-30 mintues a day due to pain decrease </= 3/10    Time 12    Period Weeks    Status On-going      PT LONG TERM GOAL #5   Title Marinoff score </= 1/3 and pain with penile penetration vaginally decreased to </= 2/10 due to reduction of her trigger points    Baseline not having intercourse with her partner and no in the unforseen future    Time 12    Period Weeks    Status Deferred                 Plan - 04/08/21 0843    Clinical Impression Statement Patient reports her pain is 50% better since the beginning of therapy. Patient had no pain after the manual work today. Patient has difficulty with engaging the gluteals with a bridge. She had increased fascial tightness in the lower abdominal area. Patient is working on building her endurance in therapy. Patient will benefit from skilled therapy to improve function and decrease apin.    Personal Factors and Comorbidities Comorbidity 2;Sex;Social Background;Fitness;Time since  onset of injury/illness/exacerbation    Comorbidities C-section 2x; ADHD    Examination-Activity Limitations Lift;Locomotion Level     Examination-Participation Restrictions Interpersonal Relationship    Stability/Clinical Decision Making Stable/Uncomplicated    Rehab Potential Good    PT Frequency 1x / week    PT Duration 12 weeks    PT Treatment/Interventions Biofeedback;Cryotherapy;Electrical Stimulation;Iontophoresis 4mg /ml Dexamethasone;Moist Heat;Ultrasound;Therapeutic activities;Therapeutic exercise;Manual techniques;Patient/family education;Neuromuscular re-education;Dry needling;Spinal Manipulations    PT Next Visit Plan internal work to the cervix and left side of the vaginal canal; hip flexor stretches; core engagement; hip strength especially the gluteals; work on left lumbar and psoas; take measurements    PT Home Exercise Plan Access Code: [email or web address]    Consulted and Agree with Plan of Care Patient           Patient will benefit from skilled therapeutic intervention in order to improve the following deficits and impairments:  Decreased activity tolerance,Decreased strength,Increased fascial restricitons,Pain,Decreased mobility,Increased muscle spasms,Decreased range of motion  Visit Diagnosis: Muscle weakness (generalized)  Cramp and spasm  Pelvic pain  Generalized abdominal pain     Problem List Patient Active Problem List   Diagnosis Date Noted  . Moderate persistent asthma with acute exacerbation 08/01/2020  . Uterine fibroid 01/23/2016  . Right groin pain 01/23/2016  . History of C-section 01/23/2016  . Migraines   . Anxiety   . Depression     Earlie Counts, PT 04/08/21 8:47 AM   [facility] [address], [place], [place], [address] Phone: [phone]   Fax:  [phone]  Name: Penny [name] MRN: [id number] Date of Birth: 1981-02-09

## 2021-04-08 NOTE — Patient Instructions (Addendum)
About Abdominal Massage  Abdominal massage, also called external colon massage, is a self-treatment circular massage technique that can reduce and eliminate gas and ease constipation. The colon naturally contracts in waves in a clockwise direction starting from inside the right hip, moving up toward the ribs, across the belly, and down inside the left hip.  When you perform circular abdominal massage, you help stimulate your colon's normal wave pattern of movement called peristalsis.  It is most beneficial when done after eating.  Positioning You can practice abdominal massage with oil while lying down, or in the shower with soap.  Some people find that it is just as effective to do the massage through clothing while sitting or standing.  How to Massage Start by placing your finger tips or knuckles on your right side, just inside your hip bone.  . Make small circular movements while you move upward toward your rib cage.   . Once you reach the bottom right side of your rib cage, take your circular movements across to the left side of the bottom of your rib cage.  . Next, move downward until you reach the inside of your left hip bone.  This is the path your feces travel in your colon. . Continue to perform your abdominal massage in this pattern for 10 minutes each day.     You can apply as much pressure as is comfortable in your massage.  Start gently and build pressure as you continue to practice.  Notice any areas of pain as you massage; areas of slight pain may be relieved as you massage, but if you have areas of significant or intense pain, consult with your healthcare provider.  Other Considerations . General physical activity including bending and stretching can have a beneficial massage-like effect on the colon.  Deep breathing can also stimulate the colon because breathing deeply activates the same nervous system that supplies the colon.   . Abdominal massage should always be used in  combination with a bowel-conscious diet that is high in the proper type of fiber for you, fluids (primarily water), and a regular exercise program. [facility] [address], [address] [place], [place] [address] Phone # [phone] Fax [phone]  

## 2021-04-09 ENCOUNTER — Ambulatory Visit: Payer: Self-pay | Admitting: Allergy

## 2021-04-09 NOTE — Progress Notes (Deleted)
Follow Up Note  RE: Penny [name] MRN: [id number] DOB: 04/12/1981 Date of Office Visit: 04/09/2021  Referring provider: Vonna Drafts, FNP Primary care provider: Vonna Drafts, FNP  Chief Complaint: No chief complaint on file.  History of Present Illness: I had the pleasure of seeing Penny [name] for a follow up visit at the [facility] [place] on 04/09/2021. She is a 40 y.o. female, who is being followed for asthma, allergic rhinoconjunctivitis, reflux, food allergy. Her previous allergy office visit was on 04/07/2021 with Gareth [name], [place]. Today is a new complaint visit of not feeling well after [employer] booster vaccine.    Patient returned to the clinic about 4:30 stating that shortly after receiving [place] and leaving the clinic she began to experience symptoms including shortness of breath, lethargy, and feeling like her heart was racing.  She called the clinic to let us know that she was feeling a little bit better after taking Xopenex.  Upon return to the clinic, she is walking extremely slow and reports shortness of breath and feeling like her heart is racing.  Physical exam and vital signs remained stable.  Patient received Xopenex via nebulizer 1 time with resolution of shortness of breath and heart racing.  Vital signs remained within normal limits.  Patient given anticipatory guidance regarding when to call 911 or go to the emergency department.  Patient was provided with an on-call phone number for emergency.  Patient was provided with Xopenex solution for nebulizer and nebulizer tubing.  She is alert and oriented during the visit and answering questions appropriately.  We will call her if in the morning to check on her and she will follow-up in the clinic on Friday with Dr. Maudie Mercury   Asthma Set an alarm on your phone (for twice a day) to help you remember to take your medications.  Continue montelukast 10 mg once a day to prevent cough or  wheeze Continue Symbicort 160-2 puffs twice a day with a spacer to prevent cough or wheeze Continue Xopenex once every 4-6 hours as needed for cough or wheeze Continue Xolair injections 375 mg once every 2 weeks and have access to an epinephrine autoinjector set  Allergic rhinitis Continue allergen avoidance measures directed toward grass pollen, weed pollen, ragweed pollen, tree pollen, mold, cat, dog, and cockroach Continue montelukast 10 mg once a day as listed above Continue Xyzal 5 mg once a day as needed for runny nose or itch Consider saline nasal rinses as needed for nasal symptoms. Use this before any medicated nasal sprays for best result  Allergic conjunctivitis Begin [place] eyedrops 1 drop in each eye once a day as needed for red or itchy eyes  Reflux Continue dietary and lifestyle modifications as listed below Continue Nexium 40 mg once a day as previously prescribed  Food allergy Continue to avoid shellfish. In case of an allergic reaction, take Benadryl 50 mg  every 4 hours, and if life-threatening symptoms occur, inject with EpiPen 0.3 mg.  Tobacco Continue to cut down or quit.  Written materials diet from Up-To-Date  COVID vaccine You have received your [place] booster vaccine at today's visit.   Assessment and Plan: Penny [name] is a 40 y.o. female with: No problem-specific Assessment & Plan notes found for this encounter.  No follow-ups on file.  No orders of the defined types were placed in this encounter.  Lab Orders  No laboratory test(s) ordered today    Diagnostics: Spirometry:  Tracings  reviewed. Her effort: {Blank single:19197::"Good reproducible efforts.","It was hard to get consistent efforts and there is a question as to whether this reflects a maximal maneuver.","Poor effort, data can not be interpreted."} FVC: ***L FEV1: ***L, ***% predicted FEV1/FVC ratio: ***% Interpretation: {Blank single:19197::"Spirometry consistent with mild  obstructive disease","Spirometry consistent with moderate obstructive disease","Spirometry consistent with severe obstructive disease","Spirometry consistent with possible restrictive disease","Spirometry consistent with mixed obstructive and restrictive disease","Spirometry uninterpretable due to technique","Spirometry consistent with normal pattern","No overt abnormalities noted given today's efforts"}.  Please see scanned spirometry results for details.  Skin Testing: {Blank single:19197::"Select foods","Environmental allergy panel","Environmental allergy panel and select foods","Food allergy panel","None","Deferred due to recent antihistamines use"}. Positive test to: ***. Negative test to: ***.  Results discussed with patient/family.   Medication List:  Current Outpatient Medications  Medication Sig Dispense Refill  . ACCU-CHEK GUIDE test strip USE 1 strip 2 TIMES DAILY BEFORE MEALS    . Accu-Chek Softclix Lancets lancets 2 (two) times daily.    Marland Kitchen albuterol (PROVENTIL) (2.5 MG/3ML) 0.083% nebulizer solution Take 3 mLs (2.5 mg total) by nebulization every 6 (six) hours as needed for wheezing or shortness of breath. 75 mL 5  . alum & mag hydroxide-simeth (MYLANTA MAXIMUM STRENGTH) 400-400-40 MG/5ML suspension Take 10 mLs by mouth every 6 (six) hours as needed for indigestion. 355 mL 0  . amoxicillin-clavulanate (AUGMENTIN) 875-125 MG tablet amoxicillin 875 mg-potassium clavulanate 125 mg tablet  Take 1 tablet every 12 hours by oral route.    . benzonatate (TESSALON PERLES) 100 MG capsule Take 1 capsule (100 mg total) by mouth 2 (two) times daily as needed for cough. 30 capsule 0  . Blood Glucose Monitoring Suppl (ACCU-CHEK GUIDE) w/Device KIT See admin instructions.    . budesonide-formoterol (SYMBICORT) 160-4.5 MCG/ACT inhaler Inhale 2 puffs into the lungs in the morning and at bedtime. 1 each 2  . budesonide-formoterol (SYMBICORT) 160-4.5 MCG/ACT inhaler 2 puffs twice a day with a spacer to  prevent cough or wheeze 1 each 1  . EPINEPHrine 0.3 mg/0.3 mL IJ SOAJ injection SMARTSIG:1 Syringe(s) IM PRN    . EPINEPHrine 0.3 mg/0.3 mL IJ SOAJ injection Inject 0.3 mg into the muscle as needed for anaphylaxis. As needed for life-threatening allergic reactions 2 each 1  . esomeprazole (NEXIUM) 40 MG capsule Take 1 capsule (40 mg total) by mouth daily. 30 capsule 0  . fluconazole (DIFLUCAN) 150 MG tablet Take 1 tablet (150 mg total) by mouth daily. Take one tablet now and one in 3 days if you are still having symptoms 2 tablet 0  . fluticasone (FLOVENT HFA) 220 MCG/ACT inhaler Inhale 2 puffs into the lungs in the morning and at bedtime. 12 g 5  . gabapentin (NEURONTIN) 800 MG tablet Take 800 mg by mouth 3 (three) times daily.    Marland Kitchen HYDROcodone-acetaminophen (NORCO/VICODIN) 5-325 MG tablet Take 1 tablet by mouth every 6 (six) hours as needed for up to 3 doses for severe pain. 3 tablet 0  . ipratropium (ATROVENT) 0.03 % nasal spray Place 2 sprays into both nostrils every 12 (twelve) hours. 30 mL 12  . levalbuterol (XOPENEX HFA) 45 MCG/ACT inhaler Inhale 1 puff every 4-6 hours as needed for cough or wheeze. 15 g 1  . levocetirizine (XYZAL) 5 MG tablet Take 1 tablet (5 mg total) by mouth every evening. 30 tablet 0  . levocetirizine (XYZAL) 5 MG tablet Take 1 tablet (5 mg total) by mouth every evening. 30 tablet 5  . levonorgestrel (MIRENA, 52 MG,) 20 MCG/24HR  IUD Mirena 20 mcg/24 hours (7 yrs) 52 mg intrauterine device  Take by intrauterine route.    . metoprolol succinate (TOPROL-XL) 25 MG 24 hr tablet Take 25 mg by mouth 2 (two) times daily.    . metroNIDAZOLE (FLAGYL) 500 MG tablet Take 1 tablet (500 mg total) by mouth 2 (two) times daily. 14 tablet 0  . montelukast (SINGULAIR) 10 MG tablet Take 1 tablet (10 mg total) by mouth at bedtime. 30 tablet 5  . nicotine (NICODERM CQ - DOSED IN MG/24 HOURS) 14 mg/24hr patch Place 1 patch (14 mg total) onto the skin daily. 28 patch 0  . Olopatadine HCl  (PATADAY) 0.2 % SOLN Place 1 drop into both eyes daily as needed. 2.5 mL 5  . promethazine (PHENERGAN) 12.5 MG tablet Take 1 tablet (12.5 mg total) by mouth every 6 (six) hours as needed for nausea or vomiting. 30 tablet 0  . promethazine (PHENERGAN) 25 MG tablet Take 1 tablet (25 mg total) by mouth every 6 (six) hours as needed for up to 3 days for nausea or vomiting. 12 tablet 0  . tiZANidine (ZANAFLEX) 4 MG tablet Take 4 mg by mouth every 6 (six) hours as needed for muscle spasms.    [place] HFA 45 MCG/ACT inhaler Inhale 2 puffs into the lungs every 4 (four) hours as needed for wheezing. 15 g 1   Current Facility-Administered Medications  Medication Dose Route Frequency Provider Last Rate Last Admin  . omalizumab Arvid Right) injection 375 mg  375 mg Subcutaneous Q14 Days Valentina Shaggy, MD   375 mg at 04/01/21 0932   Allergies: Allergies  Allergen Reactions  . Other Itching and Swelling    Itching and swelling in mouth and face  . Shrimp Extract Allergy Skin Test Swelling  . Shellfish-Derived Products   . Shrimp [Shellfish Allergy] Itching and Swelling    Itching and swelling in mouth and face   I reviewed her past medical history, social history, family history, and environmental history and no significant changes have been reported from her previous visit.  Review of Systems  Constitutional: Negative for appetite change, chills, fever and unexpected weight change.  HENT: Negative for congestion and rhinorrhea.   Eyes: Negative for itching.  Respiratory: Negative for cough, chest tightness, shortness of breath and wheezing.   Gastrointestinal: Negative for abdominal pain.  Skin: Negative for rash.  Neurological: Negative for headaches.   Objective: LMP  (LMP Unknown)  There is no height or weight on file to calculate BMI. Physical Exam Vitals and nursing note reviewed.  Constitutional:      Appearance: Normal appearance. She is well-developed.  HENT:     Head:  Normocephalic and atraumatic.     Right Ear: External ear normal.     Left Ear: External ear normal.     Nose: Nose normal.     Mouth/Throat:     Mouth: Mucous membranes are moist.     Pharynx: Oropharynx is clear.  Eyes:     Conjunctiva/sclera: Conjunctivae normal.  Cardiovascular:     Rate and Rhythm: Normal rate and regular rhythm.     Heart sounds: Normal heart sounds. No murmur heard.   Pulmonary:     Effort: Pulmonary effort is normal.     Breath sounds: Normal breath sounds. No wheezing, rhonchi or rales.  Musculoskeletal:     Cervical back: Neck supple.  Skin:    General: Skin is warm.     Findings: No rash.  Neurological:  Mental Status: She is alert and oriented to person, place, and time.  Psychiatric:        Behavior: Behavior normal.    Previous notes and tests were reviewed. The plan was reviewed with the patient/family, and all questions/concerned were addressed.  It was my pleasure to see Zamzam today and participate in her care. Please feel free to contact me with any questions or concerns.  Sincerely,  Rexene Alberts, DO Allergy & Immunology  [facility] office: [place] office: [phone]

## 2021-04-11 ENCOUNTER — Telehealth: Payer: Self-pay | Admitting: Allergy & Immunology

## 2021-04-11 NOTE — Telephone Encounter (Signed)
Patient called and was very short of breath on the phone.  She was quite tachypneic and cannot get an entire sentence out.  She tells me this was all triggered from her [place] booster on Wednesday.  I seem to remember that she had a similar episode on Wednesday night.  I recommended that she use her nebulizer machine but if there is no improvement in her 10 to 15-minute time span, she should call 911 for evaluation.  Patient agreed.  I called the patient 30 minutes and she sounded completely different.  She was talking in full sentences and seemed at ease.  She is thinks she just got overheated with the weather.  She did use a nebulizer treatment and felt fine afterwards.  Salvatore Marvel, MD [facility] [place] [facility] [place]

## 2021-04-12 MED ORDER — LEVALBUTEROL TARTRATE 45 MCG/ACT IN AERO: INHALATION_SPRAY | RESPIRATORY_TRACT | 1 refills | Status: DC

## 2021-04-12 NOTE — Telephone Encounter (Addendum)
Patient called again and was very short of breath. She requested that her Xopenex be sent in to a different pharmacy. She says that she lost her other Xoepenx MDI. She does have a nebulizer still and I asked her if she needed to use it.  She adamantly said no and said she just wanted her Xopenex MDI.  I assured her that I was just worried that she needed a higher level of care than could be provided over the phone.  She provided me with the pharmacy for the new prescription and thanked me.   Salvatore Marvel, MD [facility] [place] [facility] [place]

## 2021-04-14 ENCOUNTER — Telehealth: Payer: Self-pay | Admitting: Family Medicine

## 2021-04-14 NOTE — Telephone Encounter (Signed)
Patient called and said the she is out of her Xopenex and having problems with the pharmacy and needs her meds. Now. [phone].

## 2021-04-14 NOTE — Telephone Encounter (Signed)
Pt called stating she was told by pharmacy that she would need a PA for her Xopenex. Patient is requesting that someone work on this as soon as possible.   Patient would like to know if any samples of Xopenex could be left up front for her to pick up. If no samples are available could something different be sent in to pharmacy that insurance will cover. Patient states albuterol does not work for her.   [facility] - [address], [place] [address]  Best number to contact patient: [phone]

## 2021-04-15 ENCOUNTER — Other Ambulatory Visit: Payer: Self-pay

## 2021-04-15 ENCOUNTER — Ambulatory Visit: Payer: Medicaid Other | Attending: Nurse Practitioner | Admitting: Physical Therapy

## 2021-04-15 ENCOUNTER — Encounter: Payer: Self-pay | Admitting: Physical Therapy

## 2021-04-15 ENCOUNTER — Ambulatory Visit (INDEPENDENT_AMBULATORY_CARE_PROVIDER_SITE_OTHER): Payer: Medicaid Other | Admitting: *Deleted

## 2021-04-15 DIAGNOSIS — R252 Cramp and spasm: Secondary | ICD-10-CM | POA: Diagnosis present

## 2021-04-15 DIAGNOSIS — R1084 Generalized abdominal pain: Secondary | ICD-10-CM | POA: Insufficient documentation

## 2021-04-15 DIAGNOSIS — M6281 Muscle weakness (generalized): Secondary | ICD-10-CM | POA: Insufficient documentation

## 2021-04-15 DIAGNOSIS — J4540 Moderate persistent asthma, uncomplicated: Secondary | ICD-10-CM

## 2021-04-15 DIAGNOSIS — R102 Pelvic and perineal pain: Secondary | ICD-10-CM | POA: Diagnosis present

## 2021-04-15 NOTE — Telephone Encounter (Signed)
PA has been approved for Levalbuterol HFA through [employer]. Called patient and left a detailed voicemail advising. PA approval has been faxed to patient's pharmacy, labeled, and placed in bulk scanning.

## 2021-04-15 NOTE — Therapy (Signed)
[facility] [address], [place], [place], [address] Phone: [phone]   Fax:  [phone]  Physical Therapy Treatment  Patient Details  Name: Penny [name] MRN: [id number] Date of Birth: 10/31/1981 Referring Provider (PT): Claris Gladden. Ouida Sills, NP   Encounter Date: 04/15/2021   PT End of Session - 04/15/21 1012    Visit Number 5    Authorization Type Medicaid    Authorization Time Period 5/26-8/17    Authorization - Visit Number 2    Authorization - Number of Visits 12    PT Start Time 0930    PT Stop Time 1013    PT Time Calculation (min) 43 min    Activity Tolerance Patient tolerated treatment well;No increased pain    Behavior During Therapy WFL for tasks assessed/performed           Past Medical History:  Diagnosis Date  . ADHD (attention deficit hyperactivity disorder)   . Anxiety    no meds  . Asthma   . Chronic back pain   . Depression    no meds  . Essential hypertension    a. Dx 06/2016.  . Migraines    last one over 1 month ago  . Overweight   . Palpitations - Sinus Tachycardia    a. 12/2016 Echo: E f55-60%, no rwma, nl LA/RA sizes.  . Placenta previa    w/ 2 pregnancies.  . Pre-diabetes   . Preterm labor    c/s at 36 wks  . Recurrent upper respiratory infection (URI)   . Scoliosis   . Sinus tachycardia     Past Surgical History:  Procedure Laterality Date  . CESAREAN SECTION  2010   x 1 at 36 wks in [place]  . CESAREAN SECTION  February 13, 2012  . CHROMOPERTUBATION  10/26/2015   Procedure: ATTEMPTED CHROMOPERTUBATION;  Surgeon: Crawford Givens, MD;  Location: [facility];  Service: Gynecology;;  . LAPAROSCOPIC LYSIS OF ADHESIONS  10/26/2015   Procedure: LAPAROSCOPIC LYSIS OF ADHESIONS;  Surgeon: Crawford Givens, MD;  Location: [facility];  Service: Gynecology;;  . LAPAROSCOPY N/A 10/26/2015   Procedure: LAPAROSCOPY OPERATIVE WITH REMOVAL OF MISPLACED FILSHE CLIP;  Surgeon: Crawford Givens, MD;   Location: [facility];  Service: Gynecology;  Laterality: N/A;  . SVD  2003   x 1 in [place]  . TUBAL LIGATION    . WISDOM TOOTH EXTRACTION      There were no vitals filed for this visit.   Subjective Assessment - 04/15/21 0940    Subjective I feel good. The pain is barely there. My pain is 65% better.    Patient Stated Goals reduce her pain    Currently in Pain? Yes    Pain Score 4     Pain Orientation Right;Left    Pain Descriptors / Indicators Dull    Pain Type Acute pain    Pain Onset More than a month ago    Pain Frequency Intermittent    Aggravating Factors  wlaking, hip flexion of right leg, increased weight onto right leg with walking    Pain Relieving Factors medication, heat    Multiple Pain Sites No                          Pelvic Floor Special Questions - 04/15/21 0001    Pelvic Floor Internal Exam Patient confirms identification and approves PT to assess pelvic floor and treatment    Exam  Type Vaginal    Palpation tenderness located on the sides of the bladder, along the left iliococcygeus    Strength good squeeze, good lift, able to hold agaisnt strong resistance             [facility] Adult PT Treatment/Exercise - 04/15/21 0001      Lumbar Exercises: Stretches   Active Hamstring Stretch Right;Left;1 rep;30 seconds    Active Hamstring Stretch Limitations supine with strap    Hip Flexor Stretch Right;Left;1 rep;30 seconds    Hip Flexor Stretch Limitations supine with leg off the mat    ITB Stretch Right;1 rep;Left;30 seconds    ITB Stretch Limitations supine with strap    Piriformis Stretch Right;Left;1 rep;30 seconds    Piriformis Stretch Limitations supine with trunk rotation      Lumbar Exercises: Aerobic   Stationary Bike level 1 5 minutes while assessing the patient      Manual Therapy   Manual Therapy Internal Pelvic Floor    Internal Pelvic Floor manual moilization to bilateral sides of the bladder, along the left iliococcygeus, and right  obturator internist                    PT Short Term Goals - 03/24/21 1245      PT SHORT TERM GOAL #1   Title pt to be I with intial HEP     Time 4    Period Weeks    Status Achieved             PT Long Term Goals - 04/15/21 1016      PT LONG TERM GOAL #2   Title understand ways to manage pain with meditation, breathing, ways to think of pain    Time 12    Period Weeks    Status Achieved                 Plan - 04/15/21 1012    Clinical Impression Statement Patient reports her overal  pain has decreased by 65%. Patient had some restrictions in the pelvic floor muscles that released after manual work. Patient was able to do 5 minutes on the bike but had increased breathing. Patient reports she is doing increased walking and trying to stretch. She is living in a home for people without places to live. She understands ways to manage pain but difficult wiht her living situation. Patient reports the internal manual work helps her with her pain. Patient will benefit from skilled therapy to improve function and decrease pain.    Personal Factors and Comorbidities Comorbidity 2;Sex;Social Background;Fitness;Time since onset of injury/illness/exacerbation    Comorbidities C-section 2x; ADHD    Examination-Activity Limitations Lift;Locomotion Level    Examination-Participation Restrictions Interpersonal Relationship    Stability/Clinical Decision Making Stable/Uncomplicated    Rehab Potential Good    PT Frequency 1x / week    PT Treatment/Interventions Biofeedback;Cryotherapy;Electrical Stimulation;Iontophoresis 4mg /ml Dexamethasone;Moist Heat;Ultrasound;Therapeutic activities;Therapeutic exercise;Manual techniques;Patient/family education;Neuromuscular re-education;Dry needling;Spinal Manipulations    PT Next Visit Plan hip flexor stretches; core engagement; hip strength especially the gluteals; work on left lumbar and psoas; take measurements    PT Home Exercise Plan  Access Code: [email or web address]    Consulted and Agree with Plan of Care Patient           Patient will benefit from skilled therapeutic intervention in order to improve the following deficits and impairments:  Decreased activity tolerance,Decreased strength,Increased fascial restricitons,Pain,Decreased mobility,Increased muscle spasms,Decreased range of motion  Visit Diagnosis: Muscle weakness (generalized)  Cramp and spasm  Pelvic pain  Generalized abdominal pain     Problem List Patient Active Problem List   Diagnosis Date Noted  . Moderate persistent asthma with acute exacerbation 08/01/2020  . Uterine fibroid 01/23/2016  . Right groin pain 01/23/2016  . History of C-section 01/23/2016  . Migraines   . Anxiety   . Depression     Earlie Counts, PT 04/15/21 10:18 AM   [facility] [address] [place], [place], [address] Phone: [phone]   Fax:  [phone]  Name: CAITLYNNE HARBECK MRN: [id number] Date of Birth: [birth year]

## 2021-04-19 ENCOUNTER — Other Ambulatory Visit: Payer: Self-pay | Admitting: *Deleted

## 2021-04-19 MED ORDER — LEVALBUTEROL HCL 1.25 MG/3ML IN NEBU: 1.2500 mg | INHALATION_SOLUTION | RESPIRATORY_TRACT | 1 refills | Status: DC | PRN

## 2021-04-20 NOTE — Congregational Nurse Program (Signed)
  Dept: [facility] Nurse Program Note  Date of Encounter: 04/20/2021   Penny [name] requested meet with nurse today.  Reports that she received her COVID booster 2 weeks ago and feels that it made her Asthma flare up.  She has been seen by her PCP and everything checked out alright.  Said that her PCP told her to return if coughing continued. Client feels the coughing is better.  No coughing during 20 minute visit with nurse. Client hasn't seen her therapist in over a month and she's not sure why.  Does have a visit scheduled for this Friday. Nurse inquired as to how client normally deals with her stress and she states that she tries to stay positive and prays. Nurse stated both are good strategies and that reducing her stress may help with her physical ailments.  Client agreed.  Housing is still at a stand still as client owes money to her previous landlord. Client plans to pay the money and realizes that she will need to before she can get her housing voucher back.. Clients lungs are clear to auscultation bilaterally X 4 quadrants.  B/P 136/84, Heart Rate- 96 and regular.  Client plans to call her PMP tomorrow if needed. Nurse will see client next Tuesday.  Mendi Constable D. [name] MSN, RN [facility] [phone]  Past Medical History: Past Medical History:  Diagnosis Date  . ADHD (attention deficit hyperactivity disorder)   . Anxiety    no meds  . Asthma   . Chronic back pain   . Depression    no meds  . Essential hypertension    a. Dx 06/2016.  . Migraines    last one over 1 month ago  . Overweight   . Palpitations - Sinus Tachycardia    a. 12/2016 Echo: E f55-60%, no rwma, nl LA/RA sizes.  . Placenta previa    w/ 2 pregnancies.  . Pre-diabetes   . Preterm labor    c/s at 36 wks  . Recurrent upper respiratory infection (URI)   . Scoliosis   . Sinus tachycardia     Encounter Details:  CNP Questionnaire - 04/20/21 1649      Questionnaire   Do  you give verbal consent to treat you today? Yes    Visit Setting Other    Location Patient Served At [facility]    Patient Status Homeless    Medical Provider Yes    Insurance Medicaid    Intervention Educate;Spiritual Care;Support    Housing/Utilities No permanent housing    Transportation Need transportation assistance    Referrals Behavioral/Mental Health Provider    ED Visit Averted Yes    Life-Saving Intervention Made Yes

## 2021-04-22 ENCOUNTER — Ambulatory Visit: Payer: Medicaid Other | Admitting: Physical Therapy

## 2021-04-22 ENCOUNTER — Encounter: Payer: Self-pay | Admitting: Family Medicine

## 2021-04-22 ENCOUNTER — Ambulatory Visit (INDEPENDENT_AMBULATORY_CARE_PROVIDER_SITE_OTHER): Payer: Medicaid Other | Admitting: Family Medicine

## 2021-04-22 ENCOUNTER — Other Ambulatory Visit: Payer: Self-pay | Admitting: Family Medicine

## 2021-04-22 ENCOUNTER — Encounter: Payer: Self-pay | Admitting: Physical Therapy

## 2021-04-22 ENCOUNTER — Other Ambulatory Visit: Payer: Self-pay

## 2021-04-22 VITALS — BP 120/74 | HR 87 | Temp 97.8°F | Resp 16 | Ht 63.0 in | Wt 202.0 lb

## 2021-04-22 DIAGNOSIS — J302 Other seasonal allergic rhinitis: Secondary | ICD-10-CM

## 2021-04-22 DIAGNOSIS — J4550 Severe persistent asthma, uncomplicated: Secondary | ICD-10-CM

## 2021-04-22 DIAGNOSIS — J3089 Other allergic rhinitis: Secondary | ICD-10-CM

## 2021-04-22 DIAGNOSIS — R102 Pelvic and perineal pain: Secondary | ICD-10-CM

## 2021-04-22 DIAGNOSIS — M6281 Muscle weakness (generalized): Secondary | ICD-10-CM | POA: Diagnosis not present

## 2021-04-22 DIAGNOSIS — K219 Gastro-esophageal reflux disease without esophagitis: Secondary | ICD-10-CM | POA: Diagnosis not present

## 2021-04-22 DIAGNOSIS — H1010 Acute atopic conjunctivitis, unspecified eye: Secondary | ICD-10-CM | POA: Diagnosis not present

## 2021-04-22 DIAGNOSIS — J4540 Moderate persistent asthma, uncomplicated: Secondary | ICD-10-CM | POA: Insufficient documentation

## 2021-04-22 DIAGNOSIS — R1084 Generalized abdominal pain: Secondary | ICD-10-CM

## 2021-04-22 DIAGNOSIS — Z720 Tobacco use: Secondary | ICD-10-CM | POA: Insufficient documentation

## 2021-04-22 DIAGNOSIS — R252 Cramp and spasm: Secondary | ICD-10-CM

## 2021-04-22 DIAGNOSIS — T7800XA Anaphylactic reaction due to unspecified food, initial encounter: Secondary | ICD-10-CM

## 2021-04-22 HISTORY — DX: Gastro-esophageal reflux disease without esophagitis: K21.9

## 2021-04-22 MED ORDER — OLOPATADINE HCL 0.2 % OP SOLN: 1.0000 [drp] | OPHTHALMIC | 5 refills | Status: DC

## 2021-04-22 MED ORDER — BENZONATATE 100 MG PO CAPS: 100.0000 mg | ORAL_CAPSULE | Freq: Three times a day (TID) | ORAL | 0 refills | Status: DC

## 2021-04-22 MED ORDER — SPIRIVA RESPIMAT 1.25 MCG/ACT IN AERS: 2.0000 | INHALATION_SPRAY | Freq: Every day | RESPIRATORY_TRACT | 1 refills | Status: DC

## 2021-04-22 NOTE — Progress Notes (Signed)
[name] [address] Dept: [phone]  FOLLOW UP NOTE  Patient ID: Penny [name], female    DOB: Jul 25, 1981  Age: 40 y.o. MRN: [id number] Date of Office Visit: 04/22/2021  Assessment  Chief Complaint: Asthma (Has been fine since Tuesday - tuesday night had coughing and wheezing it was interrupting her sleep. Was not able to sleep until 2am. Laying on her right side helped. Today she has a tightness in her chest with a burning sensation. She was also feeling like she needed to vomit.)  HPI Malaia [name] Harth is a 40 year old female who presents the clinic for acute evaluation of shortness of breath and dry cough that began last night.  She was last seen in this clinic on 04/07/2021 for evaluation of asthma, allergic rhinitis, allergic conjunctivitis, food allergy, tobacco use, and reflux.  In the interim, she reports that she went to her primary care provider on Monday.  She reports a diagnosis at that time of viral bronchitis and was advised to continue breathing treatments and inhalers.  At today's visit, she reports that there symptoms began last night including shortness of breath and dry cough.  She denies wheeze.  She denies fever and sick contacts.  She continues montelukast 10 mg once a day, Symbicort 160-2 puffs twice a day with a spacer, and Xopenex once a day beginning on Sunday.  Her last Xolair injection was on 04/15/2040 which she tolerated with no adverse reaction.  Allergic rhinitis is reported as moderately well controlled with occasional sneeze and postnasal drainage with frequent throat clearing.  She continues Xyzal 5 mg once a day and is not currently using Flonase or saline nasal rinses.  She denies any history of epistaxis.  Reflux is reported as well controlled with no heartburn or vomiting.  She does report occasional chest tightness.  She continues Nexium and famotidine daily.  Allergic conjunctivitis is reported as moderately well controlled with  itchy eyes occurring frequently.  She reports she does not have any allergy eyedrops at this time.  She continues to smoke 3 packs of cigarettes a week.  Her current medications are listed in the chart.   Drug Allergies:  Allergies  Allergen Reactions   Other Itching and Swelling    Itching and swelling in mouth and face   Shrimp Extract Allergy Skin Test Swelling   Shellfish-Derived Products    Shrimp [Shellfish Allergy] Itching and Swelling    Itching and swelling in mouth and face    Physical Exam: BP 120/74   Pulse 87   Temp 97.8 F (36.6 C)   Resp 16   Ht 5\' 3"  (1.6 m)   Wt 202 lb (91.6 kg)   SpO2 95%   BMI 35.78 kg/m    Physical Exam Vitals reviewed.  Constitutional:      Appearance: Normal appearance.  HENT:     Head: Normocephalic and atraumatic.     Right Ear: Tympanic membrane normal.     Left Ear: Tympanic membrane normal.     Nose:     Comments: Bilateral nares slightly erythematous with clear nasal drainage noted.  Pharynx normal.  Ears normal.  Eyes normal.    Mouth/Throat:     Pharynx: Oropharynx is clear.  Eyes:     Conjunctiva/sclera: Conjunctivae normal.  Cardiovascular:     Rate and Rhythm: Normal rate and regular rhythm.     Heart sounds: Normal heart sounds. No murmur heard. Pulmonary:     Effort: Pulmonary effort  is normal.     Breath sounds: Normal breath sounds.     Comments: Lungs clear to auscultation Musculoskeletal:        General: Normal range of motion.     Cervical back: Normal range of motion and neck supple.  Skin:    General: Skin is warm and dry.  Neurological:     Mental Status: She is alert and oriented to person, place, and time.  Psychiatric:        Mood and Affect: Mood normal.        Behavior: Behavior normal.        Thought Content: Thought content normal.        Judgment: Judgment normal.    Diagnostics: FVC 2.05, FEV1 1.80.  Predicted FVC 2.97, predicted FEV1 2.45.  Spirometry indicates mild restriction.   Postbronchodilator therapy FVC 2.09, FEV1 1.91.  Postbronchodilator spirometry indicates mild restriction with no significant bronchodilator response.  This is consistent with previous spirometry readings.  Assessment and Plan: 1. Not well controlled severe persistent asthma   2. Seasonal and perennial allergic rhinitis   3. Seasonal allergic conjunctivitis   4. Allergy with anaphylaxis due to food   5. Gastroesophageal reflux disease, unspecified whether esophagitis present   6. Tobacco use     Meds ordered this encounter  Medications   Olopatadine HCl (PATADAY) 0.2 % SOLN    Sig: Place 1 drop into both eyes 1 day or 1 dose.    Dispense:  2.5 mL    Refill:  5   benzonatate (TESSALON PERLES) 100 MG capsule    Sig: Take 1 capsule (100 mg total) by mouth 3 (three) times daily for 10 days.    Dispense:  30 capsule    Refill:  0   Tiotropium Bromide Monohydrate (SPIRIVA RESPIMAT) 1.25 MCG/ACT AERS    Sig: Inhale 2 puffs into the lungs daily.    Dispense:  1 each    Refill:  1     Patient Instructions  Asthma Set an alarm on your phone (for twice a day) to help you remember to take your medications.   Begin Spiriva 1.25 mcg 2 puffs once a day to prevent cough or wheeze Continue montelukast 10 mg once a day to prevent cough or wheeze Continue Symbicort 160-2 puffs twice a day with a spacer to prevent cough or wheeze Continue Xopenex once every 4-6 hours as needed for cough or wheeze Continue Xolair injections 375 mg once every 2 weeks and have access to an epinephrine autoinjector set  Cough Begin Tessalon Perles 100 mg up to three times a day as needed for cough Begin nasal saline rinses and Nasacort  Allergic rhinitis Continue allergen avoidance measures directed toward grass pollen, weed pollen, ragweed pollen, tree pollen, mold, cat, dog, and cockroach Continue montelukast 10 mg once a day as listed above Continue Xyzal 5 mg once a day as needed for runny nose or itch Begin  saline nasal rinses as needed for nasal symptoms. Use this before any medicated nasal sprays for best result Begin Nasacort 2 sprays in each nostril once a day   Allergic conjunctivitis Begin Pataday eyedrops 1 drop in each eye once a day as needed for red or itchy eyes  Reflux Increase famotidine to 20 mg twice a day to prevent reflux Continue dietary and lifestyle modifications as listed below Continue Nexium 40 mg once a day as previously prescribed  Food allergy Continue to avoid shellfish. In case of an allergic reaction,  take Benadryl 50 mg  every 4 hours, and if life-threatening symptoms occur, inject with EpiPen 0.3 mg.  Tobacco Continue to cut down or quit.   Call the clinic if this treatment plan is not working well for you  Follow up in 1 month or sooner if needed.   Return in about 4 weeks (around 05/20/2021), or if symptoms worsen or fail to improve.    Thank you for the opportunity to care for this patient.  Please do not hesitate to contact me with questions.  Gareth [name], FNP [facility]

## 2021-04-22 NOTE — Patient Instructions (Addendum)
Asthma Set an alarm on your phone (for twice a day) to help you remember to take your medications.   Begin Spiriva 1.25 mcg 2 puffs once a day to prevent cough or wheeze Continue montelukast 10 mg once a day to prevent cough or wheeze Continue Symbicort 160-2 puffs twice a day with a spacer to prevent cough or wheeze Continue Xopenex once every 4-6 hours as needed for cough or wheeze Continue Xolair injections 375 mg once every 2 weeks and have access to an epinephrine autoinjector set  Cough Begin Tessalon Perles 100 mg up to three times a day as needed for cough Begin nasal saline rinses and Nasacort  Allergic rhinitis Continue allergen avoidance measures directed toward grass pollen, weed pollen, ragweed pollen, tree pollen, mold, cat, dog, and cockroach Continue montelukast 10 mg once a day as listed above Continue Xyzal 5 mg once a day as needed for runny nose or itch Begin saline nasal rinses as needed for nasal symptoms. Use this before any medicated nasal sprays for best result Begin Nasacort 2 sprays in each nostril once a day   Allergic conjunctivitis Begin Pataday eyedrops 1 drop in each eye once a day as needed for red or itchy eyes  Reflux Increase famotidine to 20 mg twice a day to prevent reflux Continue dietary and lifestyle modifications as listed below Continue Nexium 40 mg once a day as previously prescribed  Food allergy Continue to avoid shellfish. In case of an allergic reaction, take Benadryl 50 mg  every 4 hours, and if life-threatening symptoms occur, inject with EpiPen 0.3 mg.  Tobacco Continue to cut down or quit.  Written materials diet from Up-To-Date  COVID vaccine You have received your [place] booster vaccine at today's visit.   Call the clinic if this treatment plan is not working well for you  Follow up in 1 month or sooner if needed.   Lifestyle Changes for Controlling GERD When you have GERD, stomach acid feels as if it's backing up  toward your mouth. Whether or not you take medication to control your GERD, your symptoms can often be improved with lifestyle changes.   Raise Your Head Reflux is more likely to strike when you're lying down flat, because stomach fluid can flow backward more easily. Raising the head of your bed 4-6 inches can help. To do this: Slide blocks or books under the legs at the head of your bed. Or, place a wedge under the mattress. Many foam stores can make a suitable wedge for you. The wedge should run from your waist to the top of your head. Don't just prop your head on several pillows. This increases pressure on your stomach. It can make GERD worse.  Watch Your Eating Habits Certain foods may increase the acid in your stomach or relax the lower esophageal sphincter, making GERD more likely. It's best to avoid the following: Coffee, tea, and carbonated drinks (with and without caffeine) Fatty, fried, or spicy food Mint, chocolate, onions, and tomatoes Any other foods that seem to irritate your stomach or cause you pain  Relieve the Pressure Eat smaller meals, even if you have to eat more often. Don't lie down right after you eat. Wait a few hours for your stomach to empty. Avoid tight belts and tight-fitting clothes. Lose excess weight.  Tobacco and Alcohol Avoid smoking tobacco and drinking alcohol. They can make GERD symptoms worse.  Reducing Pollen Exposure The American Academy of Allergy, Asthma and Immunology suggests the following  steps to reduce your exposure to pollen during allergy seasons. Do not hang sheets or clothing out to dry; pollen may collect on these items. Do not mow lawns or spend time around freshly cut grass; mowing stirs up pollen. Keep windows closed at night.  Keep car windows closed while driving. Minimize morning activities outdoors, a time when pollen counts are usually at their highest. Stay indoors as much as possible when pollen counts or humidity is  high and on windy days when pollen tends to remain in the air longer. Use air conditioning when possible.  Many air conditioners have filters that trap the pollen spores. Use a HEPA room air filter to remove pollen form the indoor air you breathe.  Control of Dog or Cat Allergen Avoidance is the best way to manage a dog or cat allergy. If you have a dog or cat and are allergic to dog or cats, consider removing the dog or cat from the home. If you have a dog or cat but don't want to find it a new home, or if your family wants a pet even though someone in the household is allergic, here are some strategies that may help keep symptoms at bay:  Keep the pet out of your bedroom and restrict it to only a few rooms. Be advised that keeping the dog or cat in only one room will not limit the allergens to that room. Don't pet, hug or kiss the dog or cat; if you do, wash your hands with soap and water. High-efficiency particulate air (HEPA) cleaners run continuously in a bedroom or living room can reduce allergen levels over time. Regular use of a high-efficiency vacuum cleaner or a central vacuum can reduce allergen levels. Giving your dog or cat a bath at least once a week can reduce airborne allergen.  Control of Mold Allergen Mold and fungi can grow on a variety of surfaces provided certain temperature and moisture conditions exist.  Outdoor molds grow on plants, decaying vegetation and soil.  The major outdoor mold, Alternaria and Cladosporium, are found in very high numbers during hot and dry conditions.  Generally, a late Summer - Fall peak is seen for common outdoor fungal spores.  Rain will temporarily lower outdoor mold spore count, but counts rise rapidly when the rainy period ends.  The most important indoor molds are Aspergillus and Penicillium.  Dark, humid and poorly ventilated basements are ideal sites for mold growth.  The next most common sites of mold growth are the bathroom and the  kitchen.  Outdoor [employer] Use air conditioning and keep windows closed Avoid exposure to decaying vegetation. Avoid leaf raking. Avoid grain handling. Consider wearing a face mask if working in moldy areas.  Indoor Mold Control Maintain humidity below 50%. Clean washable surfaces with 5% bleach solution. Remove sources e.g. Contaminated carpets.   Control of Cockroach Allergen Cockroach allergen has been identified as an important cause of acute attacks of asthma, especially in urban settings.  There are fifty-five species of cockroach that exist in the [place], however only three, the [place], [place] species produce allergen that can affect patients with Asthma.  Allergens can be obtained from fecal particles, egg casings and secretions from cockroaches.    Remove food sources. Reduce access to water. Seal access and entry points. Spray runways with 0.5-1% Diazinon or Chlorpyrifos Blow boric acid power under stoves and refrigerator. Place bait stations (hydramethylnon) at feeding sites.

## 2021-04-22 NOTE — Patient Instructions (Signed)
Access Code: [email or web address] URL: [email or web address] Date: 04/22/2021 Prepared by: Earlie Counts  Exercises Supine Bridge - 1 x daily - 7 x weekly - 1 sets - 10 reps Supine Piriformis Stretch with Leg Straight - 1 x daily - 7 x weekly - 1 sets - 2 reps - 30 sec hold Half Kneeling Hip Flexor Stretch with Sidebend - 1 x daily - 7 x weekly - 1 sets - 2 reps - 30 hold Prone Alternating Arm and Leg Lifts - 1 x daily - 7 x weekly - 1 sets - 10 reps Bird Dog on Counter - 1 x daily - 7 x weekly - 1 sets - 10 reps Deadlift with Resistance - 1 x daily - 7 x weekly - 1 sets - 10 reps Standing Anti-Rotation Press with Anchored Resistance - 1 x daily - 7 x weekly - 1 sets - 10 reps [facility] [address] [place], [place] [address] Phone # [phone] Fax [phone]

## 2021-04-22 NOTE — Therapy (Signed)
[facility] [address], [place], [place], [address] Phone: [phone]   Fax:  [phone]  Physical Therapy Treatment  Patient Details  Name: Penny [name] MRN: [id number] Date of Birth: 11/11/1981 Referring Provider (PT): Claris Gladden. Ouida Sills, NP   Encounter Date: 04/22/2021   PT End of Session - 04/22/21 1006     Visit Number 6    Date for PT Re-Evaluation 05/25/21    Authorization Type Medicaid    Authorization Time Period 5/26-8/17    Authorization - Visit Number 3    Authorization - Number of Visits 12    PT Start Time 0930    PT Stop Time 0630    PT Time Calculation (min) 38 min    Activity Tolerance Patient tolerated treatment well;No increased pain    Behavior During Therapy WFL for tasks assessed/performed             Past Medical History:  Diagnosis Date   ADHD (attention deficit hyperactivity disorder)    Anxiety    no meds   Asthma    Chronic back pain    Depression    no meds   Essential hypertension    a. Dx 06/2016.   Migraines    last one over 1 month ago   Overweight    Palpitations - Sinus Tachycardia    a. 12/2016 Echo: E f55-60%, no rwma, nl LA/RA sizes.   Placenta previa    w/ 2 pregnancies.   Pre-diabetes    Preterm labor    c/s at 36 wks   Recurrent upper respiratory infection (URI)    Scoliosis    Sinus tachycardia     Past Surgical History:  Procedure Laterality Date   CESAREAN SECTION  2010   x 1 at 36 wks in [place]   CESAREAN SECTION  February 13, 2012   CHROMOPERTUBATION  10/26/2015   Procedure: ATTEMPTED CHROMOPERTUBATION;  Surgeon: Crawford Givens, MD;  Location: [facility];  Service: Gynecology;;   LAPAROSCOPIC LYSIS OF ADHESIONS  10/26/2015   Procedure: LAPAROSCOPIC LYSIS OF ADHESIONS;  Surgeon: Crawford Givens, MD;  Location: [facility];  Service: Gynecology;;   LAPAROSCOPY N/A 10/26/2015   Procedure: LAPAROSCOPY OPERATIVE WITH REMOVAL OF MISPLACED FILSHE CLIP;   Surgeon: Crawford Givens, MD;  Location: [facility];  Service: Gynecology;  Laterality: N/A;   SVD  2003   x 1 in [place] EXTRACTION      There were no vitals filed for this visit.   Subjective Assessment - 04/22/21 0933     Subjective My asthma is acting up. I have been stretching. I only have pain with movement. I have to take breaks when I walk.    Patient Stated Goals reduce her pain    Currently in Pain? Yes    Pain Score 8     Pain Location Pelvis    Pain Orientation Right;Left    Pain Descriptors / Indicators Burning;Dull;Aching    Pain Type Acute pain    Pain Onset More than a month ago    Pain Frequency Intermittent    Aggravating Factors  walking, nustep    Pain Relieving Factors heat, stretching    Multiple Pain Sites No                [facility] PT Assessment - 04/22/21 0001       AROM   Lumbar Flexion full    Lumbar Extension  full    Lumbar - Right Side Bend full    Lumbar - Right Rotation full    Lumbar - Left Rotation full                           [facility] Adult PT Treatment/Exercise - 04/22/21 0001       Lumbar Exercises: Aerobic   Nustep 7 minutes level 1 using right  arm due to left arm hurting from booster while assessing patient      Lumbar Exercises: Standing   Other Standing Lumbar Exercises standing [place] with red band in a staggered stance    Other Standing Lumbar Exercises against wall moving the opposite arm and leg flexion with therapist using the tactile cue sot not move hips; dead lift with cane in the groin and focusing flexing at the hip and use her her core;      Lumbar Exercises: Supine   Bent Knee Raise 10 reps;1 second    Bent Knee Raise Limitations opposit hand to knee with ball and lift the other leg to 90/90    Bridge 15 reps   2 sets   Other Supine Lumbar Exercises supine leg length of right leg      Lumbar Exercises: Prone   Opposite Arm/Leg Raise Right arm/Left leg;Left arm/Right  leg;15 reps;1 second                    PT Education - 04/22/21 1012     Education Details Access Code: [email or web address]    Person(s) Educated Patient    Methods Explanation;Demonstration;Verbal cues;Handout    Comprehension Returned demonstration;Verbalized understanding              PT Short Term Goals - 03/24/21 0807       PT SHORT TERM GOAL #1   Title pt to be I with intial HEP     Time 4    Period Weeks    Status Achieved               PT Long Term Goals - 04/22/21 1014       PT LONG TERM GOAL #1   Title independent with advanced HEP    Baseline continuing to learn    Time 12    Period Weeks    Status On-going      PT LONG TERM GOAL #2   Title understand ways to manage pain with meditation, breathing, ways to think of pain    Time 12    Period Weeks    Status Achieved      PT LONG TERM GOAL #3   Title able to brace her abdomen during daily tasks to reduce the pain and improve her function with abdominal pain not higher than 1-2/10    Baseline pain level 6/10    Time 12    Period Weeks    Status On-going      PT LONG TERM GOAL #4   Title able to start a walking program for 15-30 mintues a day due to pain decrease </= 3/10    Time 12    Period Weeks    Status Achieved                   Plan - 04/22/21 1006     Clinical Impression Statement Patient has full lumbar ROM now. She only gets pain with walking. Her heat and stretches have been helping. She is learning  more advanced core exercises to bulid up strength and endurance. Patient needed tactile cues to for correct pelvic alignment with her exercises today. Patient will benefit from skilled therapy to improve function and decrease pain.    Personal Factors and Comorbidities Comorbidity 2;Sex;Social Background;Fitness;Time since onset of injury/illness/exacerbation    Comorbidities C-section 2x; ADHD    Examination-Activity Limitations Lift;Locomotion Level     Examination-Participation Restrictions Interpersonal Relationship    Stability/Clinical Decision Making Stable/Uncomplicated    Rehab Potential Good    PT Frequency 1x / week    PT Duration 12 weeks    PT Treatment/Interventions Biofeedback;Cryotherapy;Electrical Stimulation;Iontophoresis 4mg /ml Dexamethasone;Moist Heat;Ultrasound;Therapeutic activities;Therapeutic exercise;Manual techniques;Patient/family education;Neuromuscular re-education;Dry needling;Spinal Manipulations    PT Next Visit Plan continue with the exercises that were given the other day and manual work as needed    PT Home Exercise Plan Access Code: [email or web address]    Consulted and Agree with Plan of Care Patient             Patient will benefit from skilled therapeutic intervention in order to improve the following deficits and impairments:  Decreased activity tolerance, Decreased strength, Increased fascial restricitons, Pain, Decreased mobility, Increased muscle spasms, Decreased range of motion  Visit Diagnosis: Muscle weakness (generalized)  Cramp and spasm  Pelvic pain  Generalized abdominal pain     Problem List Patient Active Problem List   Diagnosis Date Noted   Moderate persistent asthma with acute exacerbation 08/01/2020   Uterine fibroid 01/23/2016   Right groin pain 01/23/2016   History of C-section 01/23/2016   Migraines    Anxiety    Depression     Earlie Counts, PT 04/22/21 10:16 AM  [facility] [address], [place], [place], [address] Phone: [phone]   Fax:  [phone]  Name: Penny [name] MRN: [id number] Date of Birth: Aug 12, 1981

## 2021-04-23 NOTE — Telephone Encounter (Signed)
Per Pharmacy the brand medicaid covers has been discontinued, can we get a rx for pazeo instead please. thank you! Please advise.

## 2021-04-23 NOTE — Telephone Encounter (Signed)
Yes please Pazeo one drop in each eye once a day as needed for red or itchy eyes. Thank you

## 2021-04-27 NOTE — Congregational Nurse Program (Signed)
Dept: [facility] Nurse Program Note  Date of Encounter: 04/27/2021  Client met with nurse in the sunroom this afternoon  States she wants to keep track of her blood pressure, also states that her asthma as well as her kids has been acting up due to the hot weather.  Clients have to be out of the shelter 4 days a week from 9-3:30 pm.  Client reports that they usually hang out at shopping centers to stay cool and drink plenty of water.  Nurse suggested that she continues this, also recommended freezing water bottles the night before and perhaps hanging out at [employer] where the kids can read books etc.  Client still smokes cigarettes which nurse has advised her that this can make her asthma worse as well.  Client is aware of resources to help her to quit, she is currently not ready.Client doesn't appear to be SOB at this time, color is good and she's very talkative without gasping for breath.  Lungs are clear in all 4 quadrants, no coughing during 20 minute visit.  B/P 122/88 Heart Rate regular.   Client reports that she is still receiving physical therapy to strengthen her pelvic muscles, she gets this once a week.  She feels that all of the walking she's doing (5 miles 4 days/week) is helping her along with the PT. 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 Plan: Stay hydrated daily particularly due to heat and taking HCTZ, client understands this. Take frequent breaks when it's really hot.. Nurse will meet with client on next week.  Larya Charpentier D. [facility] MSN RN [place] [phone]   Past Medical History: Past Medical History:  Diagnosis Date   ADHD (attention deficit hyperactivity disorder)    Anxiety    no meds   Asthma    Chronic back pain    Depression    no meds   Essential hypertension    a. Dx 06/2016.    Gastroesophageal reflux disease 04/22/2021   Migraines    last one over 1 month ago   Overweight    Palpitations - Sinus Tachycardia    a. 12/2016 Echo: E f55-60%, no rwma, nl LA/RA sizes.   Placenta previa    w/ 2 pregnancies.   Pre-diabetes    Preterm labor    c/s at 36 wks   Recurrent upper respiratory infection (URI)    Scoliosis    Sinus tachycardia     Encounter Details:  CNP Questionnaire - 04/27/21 1618       Questionnaire   Do you give verbal consent to treat you today? Yes    Visit Setting Other    Location Patient Served At [facility]    Patient Status Homeless    Medical Provider Yes    Insurance Medicaid    Intervention Educate;Spiritual Care;Support    Housing/Utilities No permanent housing    Transportation Need transportation assistance    Interpersonal Safety --   Client feels safe here at shelter   Food --   Client has no food insecurities   Medication --   Client has no medication insecurities   Referrals Behavioral/Mental Health Provider    Screening Referrals --   No referrals needed   ED Visit Averted Yes    Life-Saving Intervention Made Yes

## 2021-04-29 ENCOUNTER — Other Ambulatory Visit: Payer: Self-pay

## 2021-04-29 ENCOUNTER — Ambulatory Visit: Payer: Medicaid Other | Admitting: Family Medicine

## 2021-04-29 ENCOUNTER — Encounter: Payer: Self-pay | Admitting: *Deleted

## 2021-04-29 ENCOUNTER — Ambulatory Visit (INDEPENDENT_AMBULATORY_CARE_PROVIDER_SITE_OTHER): Payer: Medicaid Other | Admitting: *Deleted

## 2021-04-29 ENCOUNTER — Encounter: Payer: Self-pay | Admitting: Family Medicine

## 2021-04-29 VITALS — BP 102/70 | HR 82 | Temp 98.1°F | Resp 14 | Ht 63.0 in | Wt 199.6 lb

## 2021-04-29 DIAGNOSIS — J302 Other seasonal allergic rhinitis: Secondary | ICD-10-CM | POA: Diagnosis not present

## 2021-04-29 DIAGNOSIS — J3089 Other allergic rhinitis: Secondary | ICD-10-CM

## 2021-04-29 DIAGNOSIS — J4551 Severe persistent asthma with (acute) exacerbation: Secondary | ICD-10-CM

## 2021-04-29 DIAGNOSIS — H1013 Acute atopic conjunctivitis, bilateral: Secondary | ICD-10-CM

## 2021-04-29 DIAGNOSIS — H1010 Acute atopic conjunctivitis, unspecified eye: Secondary | ICD-10-CM

## 2021-04-29 DIAGNOSIS — K219 Gastro-esophageal reflux disease without esophagitis: Secondary | ICD-10-CM

## 2021-04-29 DIAGNOSIS — J4540 Moderate persistent asthma, uncomplicated: Secondary | ICD-10-CM

## 2021-04-29 DIAGNOSIS — T7800XA Anaphylactic reaction due to unspecified food, initial encounter: Secondary | ICD-10-CM

## 2021-04-29 DIAGNOSIS — Z720 Tobacco use: Secondary | ICD-10-CM

## 2021-04-29 MED ORDER — LEVALBUTEROL TARTRATE 45 MCG/ACT IN AERO: INHALATION_SPRAY | RESPIRATORY_TRACT | 1 refills | Status: AC

## 2021-04-29 MED ORDER — LEVALBUTEROL HCL 1.25 MG/3ML IN NEBU: 1.2500 mg | INHALATION_SOLUTION | RESPIRATORY_TRACT | 1 refills | Status: DC | PRN

## 2021-04-29 MED ORDER — SPIRIVA RESPIMAT 1.25 MCG/ACT IN AERS: 2.0000 | INHALATION_SPRAY | Freq: Every day | RESPIRATORY_TRACT | 1 refills | Status: DC

## 2021-04-29 MED ORDER — MONTELUKAST SODIUM 10 MG PO TABS: 10.0000 mg | ORAL_TABLET | Freq: Every day | ORAL | 5 refills | Status: DC

## 2021-04-29 MED ORDER — OLOPATADINE HCL 0.7 % OP SOLN: 1.0000 [drp] | Freq: Every day | OPHTHALMIC | 5 refills | Status: AC

## 2021-04-29 MED ORDER — BUDESONIDE-FORMOTEROL FUMARATE 160-4.5 MCG/ACT IN AERO: INHALATION_SPRAY | RESPIRATORY_TRACT | 1 refills | Status: DC

## 2021-04-29 MED ORDER — FLUTICASONE PROPIONATE HFA 220 MCG/ACT IN AERO: 2.0000 | INHALATION_SPRAY | Freq: Two times a day (BID) | RESPIRATORY_TRACT | 5 refills | Status: DC

## 2021-04-29 NOTE — Patient Instructions (Addendum)
Asthma Begin prednisone 10 mg tablets. Take 2 tablets twice a day for 3 days, then take 2 tablets once a day for 1 day, then take 1 tablet on the 5th day, then stop  Set an alarm on your phone (for twice a day) to help you remember to take your medications.   Continue Spiriva 1.25 mcg 2 puffs once a day to prevent cough or wheeze Continue montelukast 10 mg once a day to prevent cough or wheeze Continue Symbicort 160-2 puffs twice a day with a spacer to prevent cough or wheeze Continue Xopenex once every 4-6 hours as needed for cough or wheeze Continue Xolair injections 375 mg once every 2 weeks and have access to an epinephrine autoinjector set For asthma flares, begin Flovent 110-2 puffs twice a day for 2 weeks, then stop Continue to look for places that are air conditioned for a break from the heat and humidity  Allergic rhinitis Continue allergen avoidance measures directed toward grass pollen, weed pollen, ragweed pollen, tree pollen, mold, cat, dog, and cockroach Continue montelukast 10 mg once a day as listed above Continue Xyzal 5 mg once a day as needed for runny nose or itch Begin saline nasal rinses as needed for nasal symptoms. Use this before any medicated nasal sprays for best result Begin Nasacort 2 sprays in each nostril once a day   Allergic conjunctivitis Begin Pataday eyedrops 1 drop in each eye once a day as needed for red or itchy eyes  Reflux Continue famotidine to 40 mg once a day to prevent reflux Continue dietary and lifestyle modifications as listed below Continue Nexium 40 mg once a day as previously prescribed  Food allergy Continue to avoid shellfish. In case of an allergic reaction, take Benadryl 50 mg  every 4 hours, and if life-threatening symptoms occur, inject with EpiPen 0.3 mg.  Tobacco Continue to cut down or quit.  Written materials diet from Up-To-Date  Call the clinic if this treatment plan is not working well for you  Follow up in 1 week or  sooner if needed.   Lifestyle Changes for Controlling GERD When you have GERD, stomach acid feels as if it's backing up toward your mouth. Whether or not you take medication to control your GERD, your symptoms can often be improved with lifestyle changes.   Raise Your Head Reflux is more likely to strike when you're lying down flat, because stomach fluid can flow backward more easily. Raising the head of your bed 4-6 inches can help. To do this: Slide blocks or books under the legs at the head of your bed. Or, place a wedge under the mattress. Many foam stores can make a suitable wedge for you. The wedge should run from your waist to the top of your head. Don't just prop your head on several pillows. This increases pressure on your stomach. It can make GERD worse.  Watch Your Eating Habits Certain foods may increase the acid in your stomach or relax the lower esophageal sphincter, making GERD more likely. It's best to avoid the following: Coffee, tea, and carbonated drinks (with and without caffeine) Fatty, fried, or spicy food Mint, chocolate, onions, and tomatoes Any other foods that seem to irritate your stomach or cause you pain  Relieve the Pressure Eat smaller meals, even if you have to eat more often. Don't lie down right after you eat. Wait a few hours for your stomach to empty. Avoid tight belts and tight-fitting clothes. Lose excess weight.  Tobacco  and Alcohol Avoid smoking tobacco and drinking alcohol. They can make GERD symptoms worse.  Reducing Pollen Exposure The American Academy of Allergy, Asthma and Immunology suggests the following steps to reduce your exposure to pollen during allergy seasons. Do not hang sheets or clothing out to dry; pollen may collect on these items. Do not mow lawns or spend time around freshly cut grass; mowing stirs up pollen. Keep windows closed at night.  Keep car windows closed while driving. Minimize morning activities outdoors, a  time when pollen counts are usually at their highest. Stay indoors as much as possible when pollen counts or humidity is high and on windy days when pollen tends to remain in the air longer. Use air conditioning when possible.  Many air conditioners have filters that trap the pollen spores. Use a HEPA room air filter to remove pollen form the indoor air you breathe.  Control of Dog or Cat Allergen Avoidance is the best way to manage a dog or cat allergy. If you have a dog or cat and are allergic to dog or cats, consider removing the dog or cat from the home. If you have a dog or cat but don't want to find it a new home, or if your family wants a pet even though someone in the household is allergic, here are some strategies that may help keep symptoms at bay:  Keep the pet out of your bedroom and restrict it to only a few rooms. Be advised that keeping the dog or cat in only one room will not limit the allergens to that room. Don't pet, hug or kiss the dog or cat; if you do, wash your hands with soap and water. High-efficiency particulate air (HEPA) cleaners run continuously in a bedroom or living room can reduce allergen levels over time. Regular use of a high-efficiency vacuum cleaner or a central vacuum can reduce allergen levels. Giving your dog or cat a bath at least once a week can reduce airborne allergen.  Control of Mold Allergen Mold and fungi can grow on a variety of surfaces provided certain temperature and moisture conditions exist.  Outdoor molds grow on plants, decaying vegetation and soil.  The major outdoor mold, Alternaria and Cladosporium, are found in very high numbers during hot and dry conditions.  Generally, a late Summer - Fall peak is seen for common outdoor fungal spores.  Rain will temporarily lower outdoor mold spore count, but counts rise rapidly when the rainy period ends.  The most important indoor molds are Aspergillus and Penicillium.  Dark, humid and poorly ventilated  basements are ideal sites for mold growth.  The next most common sites of mold growth are the bathroom and the kitchen.  Outdoor [employer] Use air conditioning and keep windows closed Avoid exposure to decaying vegetation. Avoid leaf raking. Avoid grain handling. Consider wearing a face mask if working in moldy areas.  Indoor Mold Control Maintain humidity below 50%. Clean washable surfaces with 5% bleach solution. Remove sources e.g. Contaminated carpets.   Control of Cockroach Allergen Cockroach allergen has been identified as an important cause of acute attacks of asthma, especially in urban settings.  There are fifty-five species of cockroach that exist in the [place], however only three, the [place], [place] species produce allergen that can affect patients with Asthma.  Allergens can be obtained from fecal particles, egg casings and secretions from cockroaches.    Remove food sources. Reduce access to water. Seal access and entry points.  Spray runways with 0.5-1% Diazinon or Chlorpyrifos Blow boric acid power under stoves and refrigerator. Place bait stations (hydramethylnon) at feeding sites.

## 2021-04-29 NOTE — Progress Notes (Addendum)
[place] [address] Dept: [phone]  FOLLOW UP NOTE  Patient ID: LEGEND PECORE, female    DOB: 1981/06/13  Age: 40 y.o. MRN: [id number] Date of Office Visit: 04/29/2021  Assessment  Chief Complaint: Asthma (Wants to know how to balance asthma and heat outside.)  HPI Penny [name] is a 40 year old female who presents to the clinic for urgent evaluation of asthma with acute exacerbation.  She was last seen in this clinic on 04/22/2021 for evaluation of poorly controlled asthma, allergic rhinitis, allergic conjunctivitis, reflux, tobacco use, and food allergy.  At today's visit, she reports her asthma has been poorly controlled with symptoms including shortness of breath especially with extended periods of time outside, wheezing while lying down, and dry cough that has not improved or worsened since her last visit to this clinic.  She continues montelukast 10 mg once a day, Symbicort 160-2 puffs twice a day with a spacer, Spiriva 1.25 mcg 2 puffs once a day, and Xopenex about 1-2 times a day with relief of symptoms.  She continues Xolair injections once every 2 weeks with no adverse reaction.  She reports a significant improvement in her asthma symptoms while continuing on Xolair injections.  Allergic rhinitis is reported as moderately well controlled with Xyzal, Nasacort, and saline nasal rinses as needed.  Allergic conjunctivitis is reported as well controlled with Pataday as needed.  Reflux is reported as moderately well controlled with some improvement since her last visit to this clinic.  She continues famotidine and Nexium.  She continues to avoid shellfish with no accidental ingestion or EpiPen use.  She continues to use tobacco products.  Her current medications are listed in the chart. Of note, the patient and her children are currently in a shelter and are exposed to heat and humidity during the daytime.   Drug Allergies:  Allergies  Allergen Reactions    Other Itching and Swelling    Itching and swelling in mouth and face   Shrimp Extract Allergy Skin Test Swelling   Shellfish-Derived Products    Shrimp [Shellfish Allergy] Itching and Swelling    Itching and swelling in mouth and face    Physical Exam: BP 102/70   Pulse 82   Temp 98.1 F (36.7 C)   Resp 14   Ht 5\' 3"  (1.6 m)   Wt 199 lb 9.6 oz (90.5 kg)   SpO2 96%   BMI 35.36 kg/m    Physical Exam Vitals reviewed.  Constitutional:      Appearance: Normal appearance.  HENT:     Head: Normocephalic and atraumatic.     Right Ear: Tympanic membrane normal.     Left Ear: Tympanic membrane normal.     Nose:     Comments: Bilateral nares slightly erythematous with clear nasal drainage noted.  Pharynx normal.  Ears normal.  Eyes normal.    Mouth/Throat:     Pharynx: Oropharynx is clear.  Eyes:     Conjunctiva/sclera: Conjunctivae normal.  Cardiovascular:     Rate and Rhythm: Normal rate and regular rhythm.     Heart sounds: Normal heart sounds. No murmur heard. Pulmonary:     Effort: Pulmonary effort is normal.     Breath sounds: Normal breath sounds.     Comments: Lungs clear to auscultation Musculoskeletal:        General: Normal range of motion.     Cervical back: Normal range of motion and neck supple.  Skin:    General:  Skin is warm and dry.  Neurological:     Mental Status: She is alert and oriented to person, place, and time.  Psychiatric:        Mood and Affect: Mood normal.        Behavior: Behavior normal.        Thought Content: Thought content normal.        Judgment: Judgment normal.    Diagnostics: FVC 2.17, FEV1 1.90.  Predicted FVC 2.97, predicted FEV1 2.45.  Spirometry indicates mild restriction.  This is consistent with previous spirometry readings.  Assessment and Plan: 1. Severe persistent asthma with acute exacerbation   2. Seasonal and perennial allergic rhinitis   3. Seasonal allergic conjunctivitis   4. Gastroesophageal reflux disease,  unspecified whether esophagitis present   5. Tobacco use   6. Allergy with anaphylaxis due to food     Meds ordered this encounter  Medications   budesonide-formoterol (SYMBICORT) 160-4.5 MCG/ACT inhaler    Sig: 2 puffs twice a day with a spacer to prevent cough or wheeze    Dispense:  1 each    Refill:  1   fluticasone (FLOVENT HFA) 220 MCG/ACT inhaler    Sig: Inhale 2 puffs into the lungs in the morning and at bedtime.    Dispense:  12 g    Refill:  5   levalbuterol (XOPENEX HFA) 45 MCG/ACT inhaler    Sig: Inhale 1 puff every 4-6 hours as needed for cough or wheeze.    Dispense:  15 g    Refill:  1   levalbuterol (XOPENEX) 1.25 MG/3ML nebulizer solution    Sig: Take 1.25 mg by nebulization every 4 (four) hours as needed for wheezing.    Dispense:  72 mL    Refill:  1   montelukast (SINGULAIR) 10 MG tablet    Sig: Take 1 tablet (10 mg total) by mouth at bedtime.    Dispense:  30 tablet    Refill:  5   Olopatadine HCl 0.7 % SOLN    Sig: Apply 1 drop to eye daily.    Dispense:  2.5 mL    Refill:  5   Tiotropium Bromide Monohydrate (SPIRIVA RESPIMAT) 1.25 MCG/ACT AERS    Sig: Inhale 2 puffs into the lungs daily.    Dispense:  1 each    Refill:  1     Patient Instructions  Asthma Begin prednisone 10 mg tablets. Take 2 tablets twice a day for 3 days, then take 2 tablets once a day for 1 day, then take 1 tablet on the 5th day, then stop  Set an alarm on your phone (for twice a day) to help you remember to take your medications.   Continue Spiriva 1.25 mcg 2 puffs once a day to prevent cough or wheeze Continue montelukast 10 mg once a day to prevent cough or wheeze Continue Symbicort 160-2 puffs twice a day with a spacer to prevent cough or wheeze Continue Xopenex once every 4-6 hours as needed for cough or wheeze Continue Xolair injections 375 mg once every 2 weeks and have access to an epinephrine autoinjector set For asthma flares, begin Flovent 110-2 puffs twice a day for  2 weeks, then stop Continue to look for places that are air conditioned for a break from the heat and humidity  Allergic rhinitis Continue allergen avoidance measures directed toward grass pollen, weed pollen, ragweed pollen, tree pollen, mold, cat, dog, and cockroach Continue montelukast 10 mg once a day as  listed above Continue Xyzal 5 mg once a day as needed for runny nose or itch Begin saline nasal rinses as needed for nasal symptoms. Use this before any medicated nasal sprays for best result Begin Nasacort 2 sprays in each nostril once a day   Allergic conjunctivitis Begin Pataday eyedrops 1 drop in each eye once a day as needed for red or itchy eyes  Reflux Continue famotidine to 40 mg once a day to prevent reflux Continue dietary and lifestyle modifications as listed below Continue Nexium 40 mg once a day as previously prescribed  Food allergy Continue to avoid shellfish. In case of an allergic reaction, take Benadryl 50 mg  every 4 hours, and if life-threatening symptoms occur, inject with EpiPen 0.3 mg.  Tobacco Continue to cut down or quit.  Written materials diet from [employer]  Call the clinic if this treatment plan is not working well for you  Follow up in 1 week or sooner if needed.   Return in about 1 week (around 05/06/2021), or if symptoms worsen or fail to improve.    Thank you for the opportunity to care for this patient.  Please do not hesitate to contact me with questions.  Gareth [name], FNP Allergy and [facility]

## 2021-05-06 ENCOUNTER — Telehealth: Payer: Self-pay | Admitting: Family Medicine

## 2021-05-06 ENCOUNTER — Other Ambulatory Visit: Payer: Self-pay | Admitting: Allergy & Immunology

## 2021-05-06 ENCOUNTER — Ambulatory Visit: Payer: Medicaid Other | Admitting: Family Medicine

## 2021-05-06 NOTE — Telephone Encounter (Signed)
Please advise to something to help pt with vomiting

## 2021-05-06 NOTE — Telephone Encounter (Signed)
Patient states the pharmacy told her she needs a PA for xopenex. Patient needs this medication as soon as possible, because her asthma is flaring up.   Please advise.

## 2021-05-06 NOTE — Telephone Encounter (Signed)
Patient called and said that she is coughing and has a lot of congestion and her daughter was tested for covid Monday and she has it. But Penny [name] has not got tested yet , she said that she needs something for her throwing up. [facility]. [phone].

## 2021-05-06 NOTE — Telephone Encounter (Signed)
Pa approved thru [facility] notified

## 2021-05-07 ENCOUNTER — Encounter: Payer: Medicaid Other | Admitting: Physical Therapy

## 2021-05-07 MED ORDER — PROMETHAZINE HCL 12.5 MG PO TABS: 12.5000 mg | ORAL_TABLET | Freq: Four times a day (QID) | ORAL | 0 refills | Status: DC | PRN

## 2021-05-07 NOTE — Telephone Encounter (Signed)
Patient informed.  She is getting tested tomorrow.  She wants to know if you can send in [place] in place of the Zofran; Zofran does not help her.  Please advise.

## 2021-05-07 NOTE — Telephone Encounter (Signed)
Sure.  We can send in Phenergan 12.5 mg every 6 hours as needed. Please dispense 28 tablets without refills.   Salvatore Marvel, MD [facility]

## 2021-05-07 NOTE — Telephone Encounter (Signed)
I am fairly certain that she fired me as her physician, but I am happy to send in some Zofran 4 mg oral dissolving tabs every 8 hours as needed (dispense 21 tablets).  Patient also needs to get tested.  If she gets tested in a reasonable amount of time, we could send in Paxlovid.   Salvatore Marvel, MD Allergy and [facility]

## 2021-05-10 ENCOUNTER — Encounter: Payer: Medicaid Other | Admitting: Physical Therapy

## 2021-05-13 ENCOUNTER — Ambulatory Visit: Payer: Self-pay

## 2021-05-18 ENCOUNTER — Ambulatory Visit: Payer: Self-pay

## 2021-05-18 ENCOUNTER — Telehealth: Payer: Self-pay | Admitting: *Deleted

## 2021-05-18 ENCOUNTER — Telehealth: Payer: Self-pay | Admitting: Family Medicine

## 2021-05-18 MED ORDER — PREDNISONE 10 MG PO TABS
ORAL_TABLET | ORAL | 0 refills | Status: DC
Start: 2021-05-18 — End: 2021-05-26

## 2021-05-18 NOTE — Telephone Encounter (Signed)
Patient reports that some of her family members were positive for COVID beginning June 23.  Patient reports she began to experience symptoms including chest tightness and nasal congestion on the 23rd.  She did test positive for COVID on June 30.  She reports that she continued to experience symptoms including shortness of breath while lying down, rattling when breathing in, and cough with some mucus production posttussive vomiting 3 times.  She denies cough and body aches.  She continues Symbicort 160-2 puffs twice a day with a spacer, Flovent 110 2 puffs once a day, Xopenex in the inhaled and nebulized forms, and Spiriva 1.25 mcg - 2 puffs once a day.  She is currently staying in a hotel and uses the bus line as her main form of transportation.  She does report coughing 1 time today and seeing 1 blood streak in the mucus.  She is interested in getting her Xolair injection so that her breathing does not worsen.  She agrees to begin a prednisone taper tonight and check in with the clinic in the morning.  She has been given strict instructions to go to emergency department for evaluation if her condition worsens or if she sees any further blood streaks in the mucus.  She agrees to come to the clinic on Friday for her Xolair injection.  She will call the clinic tomorrow morning to report on her condition.  She fully understands to go for emergency evaluation should any of her symptoms worsen.

## 2021-05-18 NOTE — Telephone Encounter (Signed)
See patient call note

## 2021-05-18 NOTE — Telephone Encounter (Signed)
Called to speak with patient to get additional information regarding asthma flare and positive Covid result.  Patient stated her daughter had Covid 05/05/21.  Patient tested positive on 05/13/21. Patient and children got tested again today and children were negative and patient is positive. Patient continues to have chest tightness and chest hurts if she is sitting still or walking.  Patient has increased nasal congestion that is slightly yellow.  Headache, but has history of migraines. Productive cough with red streaks in mucus. No fever. Patient is taking Nyquil every 4 hours.  Symbicort 2 puffs bid with spacer, Spiriva 2 puffs daily, Nebulizer treatment twice a day. Elderberry gummies with Zinc and Vitamin C combination.  Patient is currently staying in [place].  Patient informed I would send the note to Ronald Lobo, FNP who had seen her most recently in the office and call her back.  We would then discuss rescheduling her Kathreen Devoid for later date. Per patient-send medications to [facility] and they will deliver medications to her.

## 2021-05-19 ENCOUNTER — Telehealth: Payer: Self-pay

## 2021-05-19 NOTE — Telephone Encounter (Signed)
Nurse made call to client who answered. Nurse inquired as to how everyone was doing.  Client reports they are all fine and not having any symptoms, except that she felt her asthma may be flaring up a little.  She denies any shortness of breath or chest tightness.  States that she had to cancel her appointment at the Asthma and Allergy clinic due to isolation, however she does have all 3 of her inhalers and has been using those with good response.  Nurse counseled client regarding her smoking as it can cause her symptoms to be worse as well.  Client voices understanding and will try to cut down number smoked. Client understands also to report to doctor if she notes any shortness of breath or chest tightness.  Nurse will follow up as needed.  Norberto Wishon D. [name] MSN, Agricultural consultant [employer] [phone]

## 2021-05-19 NOTE — Telephone Encounter (Signed)
T.C. to client as planned.  Nurse had conversation with Health visitor who had just left the hotel visiting with the family and re-testing them for COVID, children were all negative however client is positive.  Tiffany states that this really upset client and that she had a melt down.  Tiffany states that client is coughing and complaining of migraine. In speaking with client earlier she had no complaints of coughing, no coughing during last telephone call or during this call.  Client does complain of chest tightness and states that she has used all 3 inhalers.  Client also states that she is still waiting for a call back from the Allergy/Asthma doctor as she has informed them as well earlier in the day. Client did not feel that she was in an emergent situation but does feel that she needs her allergy shots as she two behind in getting them now.  Nurse will place a call to her doctor's office and call her back. Nurse also informed client that due to her positive COVID status and isolation order not sure how her doctor's office will handle this.  Haisley Arens D. [name] MSN, Agricultural consultant [employer] [phone]

## 2021-05-19 NOTE — Telephone Encounter (Signed)
Nurse received T.C. from [place] at [facility] who states that two of the family members had tested positive and that they were being relocated to a hotel to quarantine and isolate for 10 days.  The family was due to be leaving the shelter today for good and would be going to [employer] in [place].Nurse thanked nurse for information. Nurse will make a phone call to Ms. Marek to see if there are any medical needs.

## 2021-05-19 NOTE — Telephone Encounter (Signed)
T.C. to Asthma/Allergy clinic and spoke with Caryl Pina and communicated that client was COVID positive, had missed allergy appointment due to isolation, and that she's complaining of coughing and chest tightness. Nurse wanted to know what course of action client needed to take considering her status. Caryl Pina said she would transfer me to injection nurse.  Call transferred but rang busy and no one returned to phone.  Nurse called back and spoke with Shirlean Mylar, nurse repeated reason for call and was transferred to [facility].  Caryl Pina stated that client would not be able to come into office due to CDC guidelines which nurse stated she understood that, however was there anything else that could be done for client who is complaining of coughing and chest tightness.  Caryl Pina stated she would speak with the provider and call client. Nurse will follow up later with client.  Emryn Flanery D. [name] MSN, Agricultural consultant [employer] [phone]

## 2021-05-19 NOTE — Telephone Encounter (Signed)
T.C. to  pharmacy and confirmed medication would be ready for pick up shortly. Nurse also spoke with shelter director who will pick up medication and deliver it to client this evening.  Nurse informed client.   Zarria Towell D. [name] MSN, Agricultural consultant [employer] [phone]

## 2021-05-19 NOTE — Telephone Encounter (Signed)
Nurse received a phone call from client who would like for nurse to come out and check her blood pressure. Client states she would just like to know what it is. Nurse told client that once she arrived to work at the shelter that she would obtain an update from Health visitor and would notify her then.  Client denies any blurred vision or dizziness.  Thinks she is having a migraine as she has a history of migraines, but is out of her medication.  States that she has notified her doctor and waiting for a call back.  Nurse will contact client after speaking with Health visitor.  Sotero Brinkmeyer D. [name] MSN, Agricultural consultant [employer] [phone]

## 2021-05-19 NOTE — Telephone Encounter (Signed)
T.C to client to see what her doctor's office decided to do.  Client reports that Lelon Frohlich her provider called her in a steroid.  States that it want be delivered until tomorrow due to the lateness of the day. Nurse will check with pharmacy to see if it's ready and then try to arrange for it to be brought to her this evening.  Client thanked nurse as she would really like to start medication today.  Darlys Buis D. [name] MSN, Agricultural consultant [employer] [phone]

## 2021-05-20 ENCOUNTER — Encounter: Payer: Medicaid Other | Admitting: Physical Therapy

## 2021-05-20 NOTE — Telephone Encounter (Signed)
Patient called and spoke with [place] advised her of her oxygen levels and they were in the normal range for Penny [name]'s baseline. Called and left a voicemail advising patient that I would call back in the morning with an update and a plan in regards to her coming in and receiving her Xolair injection.

## 2021-05-20 NOTE — Telephone Encounter (Signed)
Perfect. Thank you!

## 2021-05-20 NOTE — Telephone Encounter (Signed)
Patient called and would like for beth to call her [phone].

## 2021-05-20 NOTE — Telephone Encounter (Signed)
Patient called and stated that she has been keeping a Pulse Oximeter on her finger since around 2am this morning and it has been fluxuating between 91-95 but has been maintained mainly at 94%. I advised the patient that these are not abnormal ranges but that I would let Webb Silversmith know. Patient verbalized understanding.

## 2021-05-21 ENCOUNTER — Ambulatory Visit (HOSPITAL_COMMUNITY): Payer: Self-pay

## 2021-05-21 ENCOUNTER — Other Ambulatory Visit: Payer: Self-pay | Admitting: *Deleted

## 2021-05-21 MED ORDER — SPIRIVA RESPIMAT 1.25 MCG/ACT IN AERS: 2.0000 | INHALATION_SPRAY | Freq: Every day | RESPIRATORY_TRACT | 5 refills | Status: AC

## 2021-05-21 NOTE — Telephone Encounter (Signed)
Patient called back and verbalized understanding. She has been scheduled to come in and receive her Xolair injection this coming Monday.

## 2021-05-21 NOTE — Telephone Encounter (Signed)
Called and left a voicemail asking for the patient to return call.

## 2021-05-22 ENCOUNTER — Other Ambulatory Visit: Payer: Self-pay

## 2021-05-22 ENCOUNTER — Emergency Department (HOSPITAL_COMMUNITY)
Admission: EM | Admit: 2021-05-22 | Discharge: 2021-05-22 | Disposition: A | Discharge status: Home / Self Care | Payer: Medicaid Other | Attending: Emergency Medicine | Admitting: Emergency Medicine

## 2021-05-22 ENCOUNTER — Other Ambulatory Visit: Payer: Self-pay | Admitting: Family Medicine

## 2021-05-22 ENCOUNTER — Emergency Department (HOSPITAL_COMMUNITY): Payer: Medicaid Other

## 2021-05-22 ENCOUNTER — Encounter (HOSPITAL_COMMUNITY): Payer: Self-pay

## 2021-05-22 DIAGNOSIS — F17210 Nicotine dependence, cigarettes, uncomplicated: Secondary | ICD-10-CM | POA: Insufficient documentation

## 2021-05-22 DIAGNOSIS — I10 Essential (primary) hypertension: Secondary | ICD-10-CM | POA: Insufficient documentation

## 2021-05-22 DIAGNOSIS — Z7951 Long term (current) use of inhaled steroids: Secondary | ICD-10-CM | POA: Diagnosis not present

## 2021-05-22 DIAGNOSIS — R042 Hemoptysis: Secondary | ICD-10-CM | POA: Diagnosis present

## 2021-05-22 DIAGNOSIS — U071 COVID-19: Secondary | ICD-10-CM | POA: Insufficient documentation

## 2021-05-22 DIAGNOSIS — J4541 Moderate persistent asthma with (acute) exacerbation: Secondary | ICD-10-CM | POA: Insufficient documentation

## 2021-05-22 LAB — CBC WITH DIFFERENTIAL/PLATELET
Abs Immature Granulocytes: 0.1 10*3/uL — ABNORMAL HIGH (ref 0.00–0.07)
Basophils Absolute: 0.1 10*3/uL (ref 0.0–0.1)
Basophils Relative: 1 %
Eosinophils Absolute: 0.2 10*3/uL (ref 0.0–0.5)
Eosinophils Relative: 1 %
HCT: 46.5 % — ABNORMAL HIGH (ref 36.0–46.0)
Hemoglobin: 15.4 g/dL — ABNORMAL HIGH (ref 12.0–15.0)
Immature Granulocytes: 1 %
Lymphocytes Relative: 12 %
Lymphs Abs: 1.8 10*3/uL (ref 0.7–4.0)
MCH: 28.2 pg (ref 26.0–34.0)
MCHC: 33.1 g/dL (ref 30.0–36.0)
MCV: 85 fL (ref 80.0–100.0)
Monocytes Absolute: 0.5 10*3/uL (ref 0.1–1.0)
Monocytes Relative: 4 %
Neutro Abs: 12.2 10*3/uL — ABNORMAL HIGH (ref 1.7–7.7)
Neutrophils Relative %: 81 %
Platelets: 395 10*3/uL (ref 150–400)
RBC: 5.47 MIL/uL — ABNORMAL HIGH (ref 3.87–5.11)
RDW: 14.9 % (ref 11.5–15.5)
WBC: 14.9 10*3/uL — ABNORMAL HIGH (ref 4.0–10.5)
nRBC: 0 % (ref 0.0–0.2)

## 2021-05-22 LAB — COMPREHENSIVE METABOLIC PANEL
ALT: 24 U/L (ref 0–44)
AST: 19 U/L (ref 15–41)
Albumin: 4.1 g/dL (ref 3.5–5.0)
Alkaline Phosphatase: 74 U/L (ref 38–126)
Anion gap: 10 (ref 5–15)
BUN: 9 mg/dL (ref 6–20)
CO2: 23 mmol/L (ref 22–32)
Calcium: 9.1 mg/dL (ref 8.9–10.3)
Chloride: 105 mmol/L (ref 98–111)
Creatinine, Ser: 0.57 mg/dL (ref 0.44–1.00)
GFR, Estimated: >60 mL/min (ref >60)
Glucose, Bld: 96 mg/dL (ref 70–99)
Potassium: 3.9 mmol/L (ref 3.5–5.1)
Sodium: 138 mmol/L (ref 135–145)
Total Bilirubin: 0.8 mg/dL (ref 0.3–1.2)
Total Protein: 8.1 g/dL (ref 6.5–8.1)

## 2021-05-22 LAB — LIPASE, BLOOD: Lipase: 35 U/L (ref 11–51)

## 2021-05-22 LAB — TYPE AND SCREEN
ABO/RH(D): B POS
Antibody Screen: NEGATIVE

## 2021-05-22 MED ORDER — IOHEXOL 350 MG/ML SOLN
80.0000 mL | Freq: Once | INTRAVENOUS | Status: AC | PRN
  Administered 2021-05-22: 80 mL via INTRAVENOUS

## 2021-05-22 MED ORDER — MORPHINE SULFATE (PF) 4 MG/ML IV SOLN
6.0000 mg | Freq: Once | INTRAVENOUS | Status: AC
  Administered 2021-05-22: 6 mg via INTRAVENOUS
  Filled 2021-05-22: qty 2

## 2021-05-22 MED ORDER — SODIUM CHLORIDE 0.9 % IV SOLN: INTRAVENOUS | Status: DC

## 2021-05-22 MED ORDER — OXYCODONE-ACETAMINOPHEN 5-325 MG PO TABS: 1.0000 | ORAL_TABLET | Freq: Four times a day (QID) | ORAL | 0 refills | Status: DC | PRN

## 2021-05-22 MED ORDER — ONDANSETRON HCL 4 MG/2ML IJ SOLN
4.0000 mg | Freq: Once | INTRAMUSCULAR | Status: AC
  Administered 2021-05-22: 4 mg via INTRAVENOUS
  Filled 2021-05-22: qty 2

## 2021-05-22 NOTE — ED Provider Notes (Signed)
[facility] DEPT Provider Note   CSN: [id number] Arrival date & time: 05/22/21  1756     History Chief Complaint  Patient presents with   Shortness of Breath    Penny [name] is a 40 y.o. female.  40 year old female who presents with hemoptysis.  Patient states that she was diagnosed with COVID 7 days ago.  Has had symptoms now for over 2 weeks.  Denies any recent fever.  No vomiting or diarrhea.  Denies any dyspnea.  States she was coughing and then had a large amount of bright red blood out of her mouth.  Does have a history of underlying pulmonary disease and does use tobacco products.  Is currently on prednisone.  Denies any abdominal discomfort.  Called EMS and was transported here      Past Medical History:  Diagnosis Date   ADHD (attention deficit hyperactivity disorder)    Anxiety    no meds   Asthma    Chronic back pain    Depression    no meds   Essential hypertension    a. Dx 06/2016.   Gastroesophageal reflux disease 04/22/2021   Migraines    last one over 1 month ago   Overweight    Palpitations - Sinus Tachycardia    a. 12/2016 Echo: E f55-60%, no rwma, nl LA/RA sizes.   Placenta previa    w/ 2 pregnancies.   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/2021   Gastroesophageal reflux disease 04/22/2021   Tobacco use 04/22/2021   Moderate persistent asthma with acute exacerbation 08/01/2020   Uterine fibroid 01/23/2016   Right groin pain 01/23/2016   History of C-section 01/23/2016   Migraines    Anxiety    Depression     Past Surgical History:  Procedure Laterality Date    CESAREAN SECTION  2010   x 1 at 36 wks in [place]   CESAREAN SECTION  February 13, 2012   CHROMOPERTUBATION  10/26/2015   Procedure: ATTEMPTED CHROMOPERTUBATION;  Surgeon: Crawford Givens, MD;  Location: [facility];  Service: Gynecology;;   LAPAROSCOPIC LYSIS OF ADHESIONS  10/26/2015   Procedure: LAPAROSCOPIC LYSIS OF ADHESIONS;  Surgeon: Crawford Givens, MD;  Location: [facility];  Service: Gynecology;;   LAPAROSCOPY N/A 10/26/2015   Procedure: LAPAROSCOPY OPERATIVE WITH REMOVAL OF MISPLACED FILSHE CLIP;  Surgeon: Crawford Givens, MD;  Location: [facility];  Service: Gynecology;  Laterality: N/A;   SVD  2003   x 1 in [place]   TUBAL LIGATION     WISDOM TOOTH EXTRACTION       OB History     Gravida  3   Para  3   Term  1   Preterm  2   AB  0   Living  3      SAB  0   IAB  0   Ectopic  0   Multiple  0   Live Births  3           Family History  Problem Relation Age of Onset   Cancer Mother    CAD Mother  Hypertension Sister    Sickle cell trait Daughter    Asthma Son    Asthma Paternal Grandmother    Pseudochol deficiency Neg Hx    Malignant hyperthermia Neg Hx    Anesthesia problems Neg Hx    Hypotension Neg Hx     Social History   Tobacco Use   Smoking status: Every Day    Packs/day: 0.10    Years: 18.00    Pack years: 1.80    Types: Cigarettes   Smokeless tobacco: Never  Vaping Use   Vaping Use: Former  Substance Use Topics   Alcohol use: Yes    Comment: Rare    Drug use: Not Currently    Types: Marijuana    Home Medications Prior to Admission medications   Medication Sig Start Date End Date Taking? Authorizing Provider  ACCU-CHEK GUIDE test strip USE 1 strip 2 TIMES DAILY BEFORE MEALS 04/23/20   [provider]  Accu-Chek Softclix Lancets lancets 2 (two) times daily. 04/24/20   [provider]  albuterol (PROVENTIL) (2.5 MG/3ML) 0.083% nebulizer solution Take 3 mLs (2.5 mg total) by nebulization every 6 (six) hours as needed for wheezing  or shortness of breath. 08/14/20   Vanessa Kick, MD  alum & mag hydroxide-simeth [facility] MAXIMUM STRENGTH) 400-400-40 MG/5ML suspension Take 10 mLs by mouth every 6 (six) hours as needed for indigestion. Patient not taking: Reported on 04/29/2021 02/08/21   Loni Beckwith, PA-C  amoxicillin-clavulanate (AUGMENTIN) 875-125 MG tablet amoxicillin 875 mg-potassium clavulanate 125 mg tablet  Take 1 tablet every 12 hours by oral route. Patient not taking: Reported on 04/29/2021    [provider]  benzonatate (TESSALON PERLES) 100 MG capsule Take 1 capsule (100 mg total) by mouth 2 (two) times daily as needed for cough. 05/27/20 05/27/21  Rudolpho Sevin, NP  Blood Glucose Monitoring Suppl (ACCU-CHEK GUIDE) w/Device KIT See admin instructions. 04/23/20   [provider]  budesonide-formoterol (SYMBICORT) 160-4.5 MCG/ACT inhaler Inhale 2 puffs into the lungs in the morning and at bedtime. 08/01/20   Faustino Congress, NP  budesonide-formoterol [facility]) 160-4.5 MCG/ACT inhaler 2 puffs twice a day with a spacer to prevent cough or wheeze 04/29/21   Dara Hoyer, FNP  EPINEPHrine 0.3 mg/0.3 mL IJ SOAJ injection SMARTSIG:1 Syringe(s) IM PRN 01/14/20   [provider]  esomeprazole (NEXIUM) 40 MG capsule Take 1 capsule (40 mg total) by mouth daily. 08/14/20   Vanessa Kick, MD  famotidine (PEPCID) 40 MG tablet Take 40 mg by mouth every evening. 01/07/21   [provider]  fluconazole (DIFLUCAN) 150 MG tablet Take 1 tablet (150 mg total) by mouth daily. Take one tablet now and one in 3 days if you are still having symptoms Patient not taking: Reported on 04/29/2021 03/17/21   Pearson Forster, NP  fluticasone (FLOVENT HFA) 220 MCG/ACT inhaler Inhale 2 puffs into the lungs in the morning and at bedtime. 04/29/21   Dara Hoyer, FNP  gabapentin (NEURONTIN) 800 MG tablet Take 800 mg by mouth 3 (three) times daily.    [provider]  HYDROcodone-acetaminophen (NORCO/VICODIN)  5-325 MG tablet Take 1 tablet by mouth every 6 (six) hours as needed for up to 3 doses for severe pain. 03/10/21   Raspet, Erin K, PA-C  ipratropium (ATROVENT) 0.03 % nasal spray Place 2 sprays into both nostrils every 12 (twelve) hours. Patient not taking: Reported on 04/29/2021 05/28/20   Chase Picket, MD  levalbuterol [facility] HFA) 45 MCG/ACT inhaler  Inhale 1 puff every 4-6 hours as needed for cough or wheeze. 04/29/21   Dara Hoyer, FNP  levalbuterol (XOPENEX) 1.25 MG/3ML nebulizer solution Take 1.25 mg by nebulization every 4 (four) hours as needed for wheezing. 04/29/21   Ambs, Kathrine Cords, FNP  levocetirizine (XYZAL) 5 MG tablet Take 1 tablet (5 mg total) by mouth every evening. 07/29/20   Volney American, PA-C  levonorgestrel (MIRENA, 52 MG,) 20 MCG/24HR IUD Mirena 20 mcg/24 hours (7 yrs) 52 mg intrauterine device  Take by intrauterine route.    [provider]  metoprolol succinate (TOPROL-XL) 25 MG 24 hr tablet Take 25 mg by mouth 2 (two) times daily. 07/29/20   [provider]  metroNIDAZOLE (FLAGYL) 500 MG tablet Take 1 tablet (500 mg total) by mouth 2 (two) times daily. Patient not taking: Reported on 04/29/2021 03/18/21   Chase Picket, MD  montelukast (SINGULAIR) 10 MG tablet Take 1 tablet (10 mg total) by mouth at bedtime. 04/29/21   Dara Hoyer, FNP  nicotine (NICODERM CQ - DOSED IN MG/24 HOURS) 14 mg/24hr patch Place 1 patch (14 mg total) onto the skin daily. 08/27/20   Valentina Shaggy, MD  Olopatadine HCl 0.7 % SOLN Apply 1 drop to eye daily. 04/29/21   Dara Hoyer, FNP  predniSONE (DELTASONE) 10 MG tablet Begin prednisone 10 mg tablets. Take 2 tablets twice a day for 3 days, then take 2 tablets once a day for 1 day, then take 1 tablet on the 5th day, then stop 05/18/21   Ambs, Kathrine Cords, FNP  promethazine (PHENERGAN) 12.5 MG tablet Take 1 tablet (12.5 mg total) by mouth every 6 (six) hours as needed for nausea or vomiting. 05/07/21   Valentina Shaggy, MD   QUEtiapine (SEROQUEL) 25 MG tablet Take 25 mg by mouth at bedtime.    [provider]  Tiotropium Bromide Monohydrate (SPIRIVA RESPIMAT) 1.25 MCG/ACT AERS Inhale 2 puffs into the lungs daily. 05/21/21   Ambs, Kathrine Cords, FNP  tiZANidine (ZANAFLEX) 4 MG tablet Take 4 mg by mouth every 6 (six) hours as needed for muscle spasms.    [provider]  Cetirizine HCl 10 MG CAPS Take 1 capsule (10 mg total) by mouth daily for 10 days. Patient not taking: Reported on 06/22/2019 02/11/19 08/18/19  Wieters, Elesa Hacker, PA-C  Dextromethorphan HBr 10 MG/5ML SYRP 10 ml qhs prn cough at night time. Patient not taking: Reported on 06/22/2019 02/04/19 08/18/19  Rodriguez-Southworth, Sunday Spillers, PA-C  fluticasone [facility]) 50 MCG/ACT nasal spray Place 1 spray into both nostrils daily. 05/27/20 05/28/20  Rudolpho Sevin, NP  norethindrone (AYGESTIN) 5 MG tablet Take by mouth. 05/07/20 05/28/20  [provider]  Topiramate ER (TROKENDI XR) 200 MG CP24 Take 1 capsule by mouth daily. 08/27/19 05/28/20  [provider]    Allergies    Other, Shrimp extract allergy skin test, Shellfish-derived products, and Shrimp [shellfish allergy]  Review of Systems   Review of Systems  All other systems reviewed and are negative.  Physical Exam Updated Vital Signs Ht 1.6 m (_0 )   Wt 90.7 kg   BMI 35.43 kg/m   Physical Exam Vitals and nursing note reviewed.  Constitutional:      General: She is not in acute distress.    Appearance: Normal appearance. She is well-developed. She is not toxic-appearing.  HENT:     Head: Normocephalic and atraumatic.  Eyes:     General: Lids are normal.  Conjunctiva/sclera: Conjunctivae normal.     Pupils: Pupils are equal, round, and reactive to light.  Neck:     Thyroid: No thyroid mass.     Trachea: No tracheal deviation.  Cardiovascular:     Rate and Rhythm: Normal rate and regular rhythm.     Heart sounds: Normal heart sounds. No murmur heard.   No gallop.   Pulmonary:     Effort: Pulmonary effort is normal. No respiratory distress.     Breath sounds: Normal breath sounds. No stridor. No decreased breath sounds, wheezing, rhonchi or rales.  Abdominal:     General: There is no distension.     Palpations: Abdomen is soft.     Tenderness: There is no abdominal tenderness. There is no rebound.  Musculoskeletal:        General: No tenderness. Normal range of motion.     Cervical back: Normal range of motion and neck supple.  Skin:    General: Skin is warm and dry.     Findings: No abrasion or rash.  Neurological:     Mental Status: She is alert and oriented to person, place, and time. Mental status is at baseline.     GCS: GCS eye subscore is 4. GCS verbal subscore is 5. GCS motor subscore is 6.     Cranial Nerves: Cranial nerves are intact. No cranial nerve deficit.     Sensory: No sensory deficit.     Motor: Motor function is intact.  Psychiatric:        Attention and Perception: Attention normal.        Speech: Speech normal.        Behavior: Behavior normal.    ED Results / Procedures / Treatments   Labs (all labs ordered are listed, but only abnormal results are displayed) Labs Reviewed - No data to display  EKG None  Radiology No results found.  Procedures Procedures   Medications Ordered in ED Medications - No data to display  ED Course  I have reviewed the triage vital signs and the nursing notes.  Pertinent labs & imaging results that were available during my care of the patient were reviewed by me and considered in my medical decision making (see chart for details).    MDM Rules/Calculators/A&P                          Chest x-ray without evidence of infiltrate.  Chest CT negative for PE.  Labs are reassuring here.  Will medicate for pain and discharge Final Clinical Impression(s) / ED Diagnoses Final diagnoses:  None    Rx / DC Orders ED Discharge Orders     None        Lacretia Leigh, MD 05/22/21  2136

## 2021-05-22 NOTE — ED Triage Notes (Signed)
Pt brought in by EMS from home c/o SOB and cough. Pt was tested positive for covid June 30th. Pt reports hematemesis this morning. Pt denies fever. Pt a/ox4.   BP 138 88 HR 98 RR 20 O2 99% on RA

## 2021-05-24 ENCOUNTER — Ambulatory Visit (INDEPENDENT_AMBULATORY_CARE_PROVIDER_SITE_OTHER): Payer: Medicaid Other

## 2021-05-24 ENCOUNTER — Other Ambulatory Visit: Payer: Self-pay

## 2021-05-24 DIAGNOSIS — J4540 Moderate persistent asthma, uncomplicated: Secondary | ICD-10-CM | POA: Diagnosis not present

## 2021-05-25 NOTE — Patient Instructions (Addendum)
Asthma Set an alarm on your phone (for twice a day) to help you remember to take your medications.   Continue Spiriva 1.25 mcg 2 puffs once a day to prevent cough or wheeze Continue montelukast 10 mg once a day to prevent cough or wheeze Continue Symbicort 160-2 puffs twice a day with a spacer to prevent cough or wheeze Continue Xopenex once every 4-6 hours as needed for cough or wheeze Continue Xolair injections 375 mg once every 2 weeks and have access to an epinephrine autoinjector set For asthma flares, begin Flovent 110-2 puffs twice a day for 2 weeks, then stop  Allergic rhinitis Continue allergen avoidance measures directed toward grass pollen, weed pollen, ragweed pollen, tree pollen, mold, cat, dog, and cockroach Continue montelukast 10 mg once a day as listed above Continue Xyzal 5 mg once a day as needed for runny nose or itch Continue saline nasal rinses as needed for nasal symptoms. Use this before any medicated nasal sprays for best result Continue Nasacort 2 sprays in each nostril once a day   Reflux Continue famotidine to 40 mg once a day to prevent reflux Continue dietary and lifestyle modifications as listed below Continue Nexium 40 mg once a day as previously prescribed  Cough Continue the treatment plans as listed above Continue Tessalon Perles 100 mg up to three times a day as needed for cough  Allergic conjunctivitis Continue Pataday eyedrops 1 drop in each eye once a day as needed for red or itchy eyes  Food allergy Continue to avoid shellfish. In case of an allergic reaction, take Benadryl 50 mg  every 4 hours, and if life-threatening symptoms occur, inject with EpiPen 0.3 mg.  Tobacco Continue to cut down or quit.  Written materials diet from Up-To-Date  Call the clinic if this treatment plan is not working well for you  Follow up in 2 months or sooner if needed.   Lifestyle Changes for Controlling GERD When you have GERD, stomach acid feels as if it's  backing up toward your mouth. Whether or not you take medication to control your GERD, your symptoms can often be improved with lifestyle changes.   Raise Your Head Reflux is more likely to strike when you're lying down flat, because stomach fluid can flow backward more easily. Raising the head of your bed 4-6 inches can help. To do this: Slide blocks or books under the legs at the head of your bed. Or, place a wedge under the mattress. Many foam stores can make a suitable wedge for you. The wedge should run from your waist to the top of your head. Don't just prop your head on several pillows. This increases pressure on your stomach. It can make GERD worse.  Watch Your Eating Habits Certain foods may increase the acid in your stomach or relax the lower esophageal sphincter, making GERD more likely. It's best to avoid the following: Coffee, tea, and carbonated drinks (with and without caffeine) Fatty, fried, or spicy food Mint, chocolate, onions, and tomatoes Any other foods that seem to irritate your stomach or cause you pain  Relieve the Pressure Eat smaller meals, even if you have to eat more often. Don't lie down right after you eat. Wait a few hours for your stomach to empty. Avoid tight belts and tight-fitting clothes. Lose excess weight.  Tobacco and Alcohol Avoid smoking tobacco and drinking alcohol. They can make GERD symptoms worse.  Reducing Pollen Exposure The American Academy of Allergy, Asthma and Immunology suggests the  following steps to reduce your exposure to pollen during allergy seasons. Do not hang sheets or clothing out to dry; pollen may collect on these items. Do not mow lawns or spend time around freshly cut grass; mowing stirs up pollen. Keep windows closed at night.  Keep car windows closed while driving. Minimize morning activities outdoors, a time when pollen counts are usually at their highest. Stay indoors as much as possible when pollen counts or  humidity is high and on windy days when pollen tends to remain in the air longer. Use air conditioning when possible.  Many air conditioners have filters that trap the pollen spores. Use a HEPA room air filter to remove pollen form the indoor air you breathe.  Control of Dog or Cat Allergen Avoidance is the best way to manage a dog or cat allergy. If you have a dog or cat and are allergic to dog or cats, consider removing the dog or cat from the home. If you have a dog or cat but don't want to find it a new home, or if your family wants a pet even though someone in the household is allergic, here are some strategies that may help keep symptoms at bay:  Keep the pet out of your bedroom and restrict it to only a few rooms. Be advised that keeping the dog or cat in only one room will not limit the allergens to that room. Don't pet, hug or kiss the dog or cat; if you do, wash your hands with soap and water. High-efficiency particulate air (HEPA) cleaners run continuously in a bedroom or living room can reduce allergen levels over time. Regular use of a high-efficiency vacuum cleaner or a central vacuum can reduce allergen levels. Giving your dog or cat a bath at least once a week can reduce airborne allergen.  Control of Mold Allergen Mold and fungi can grow on a variety of surfaces provided certain temperature and moisture conditions exist.  Outdoor molds grow on plants, decaying vegetation and soil.  The major outdoor mold, Alternaria and Cladosporium, are found in very high numbers during hot and dry conditions.  Generally, a late Summer - Fall peak is seen for common outdoor fungal spores.  Rain will temporarily lower outdoor mold spore count, but counts rise rapidly when the rainy period ends.  The most important indoor molds are Aspergillus and Penicillium.  Dark, humid and poorly ventilated basements are ideal sites for mold growth.  The next most common sites of mold growth are the bathroom and  the kitchen.  Outdoor [employer] Use air conditioning and keep windows closed Avoid exposure to decaying vegetation. Avoid leaf raking. Avoid grain handling. Consider wearing a face mask if working in moldy areas.  Indoor Mold Control Maintain humidity below 50%. Clean washable surfaces with 5% bleach solution. Remove sources e.g. Contaminated carpets.   Control of Cockroach Allergen Cockroach allergen has been identified as an important cause of acute attacks of asthma, especially in urban settings.  There are fifty-five species of cockroach that exist in the [place], however only three, the [place], [place] species produce allergen that can affect patients with Asthma.  Allergens can be obtained from fecal particles, egg casings and secretions from cockroaches.    Remove food sources. Reduce access to water. Seal access and entry points. Spray runways with 0.5-1% Diazinon or Chlorpyrifos Blow boric acid power under stoves and refrigerator. Place bait stations (hydramethylnon) at feeding sites.

## 2021-05-25 NOTE — Progress Notes (Deleted)
Penny [name] [id number] Dept: [phone]  FOLLOW UP NOTE  Patient ID: Penny [name], female    DOB: 1981-01-12  Age: 40 y.o. MRN: [id number] Date of Office Visit: 05/26/2021  Assessment  Chief Complaint: Asthma (Had covid end of June tested positive on June 30th)  HPI Penny [name] is a 40 year old female who presents to the clinic for follow-up visit.  She was last seen in this clinic on 04/29/2021 for evaluation of asthma, allergic rhinitis, allergic conjunctivitis, reflux, food allergy, and tobacco use.  In the interim, she was diagnosed with COVID on 05/13/2021.  She went to ED on 05/22/2021 with hemoptysis where she was noted to have a negative CT scan and a negative chest x-ray.  At today's visit, she reports that her asthma has been much more well controlled, however, she continues to have some chest tightness, some shortness of breath and some cough which is occasionally dry and occasionally producing clear mucus.  She denies hemoptysis since her visit to the ED.  She continues montelukast 10 mg once a day, Symbicort 160-2 puffs twice a day, Spiriva 1.25 mcg 2 puffs once a day, Xopenex at least once a day, and Xolair injections 375 mg once every 2 weeks.  Allergic rhinitis is reported as much more well controlled with symptoms including occasional sneeze and itching in her throat.  She continues Xyzal 5 mg once a day and Nasacort 2 sprays in each nostril once a day.  Allergic conjunctivitis is reported as well controlled with olopatadine as needed.  Reflux is reported as well controlled with no symptoms of heartburn or vomiting.  She continues famotidine 40 mg once a day and Nexium 40 mg once a day.  She continues to avoid shellfish with no accidental ingestion or EpiPen use since her last visit to this clinic.  She reports that she has recently significantly reduced her tobacco use to 1 to 2 cigarettes a day.  Her current medications are listed in the  chart.   Drug Allergies:  Allergies  Allergen Reactions   Other Itching and Swelling    Itching and swelling in mouth and face   Shrimp Extract Allergy Skin Test Swelling   Shellfish-Derived Products    Shrimp [Shellfish Allergy] Itching and Swelling    Itching and swelling in mouth and face    Physical Exam: BP 120/78   Pulse 94   Temp 98 F (36.7 C) (Temporal)   Resp 18   SpO2 96%    Physical Exam Vitals reviewed.  Constitutional:      Appearance: Normal appearance.  HENT:     Head: Normocephalic and atraumatic.     Right Ear: Tympanic membrane normal.     Left Ear: Tympanic membrane normal.     Nose:     Comments: Bilateral nares slightly erythematous with clear nasal drainage noted.  Pharynx normal.  Ears normal.  Eyes normal.    Mouth/Throat:     Pharynx: Oropharynx is clear.  Eyes:     Conjunctiva/sclera: Conjunctivae normal.  Cardiovascular:     Rate and Rhythm: Normal rate and regular rhythm.     Heart sounds: Normal heart sounds. No murmur heard. Pulmonary:     Effort: Pulmonary effort is normal.     Breath sounds: Normal breath sounds.     Comments: Lungs clear to auscultation Musculoskeletal:        General: Normal range of motion.     Cervical back: Normal range of motion  and neck supple.  Skin:    General: Skin is warm and dry.  Neurological:     Mental Status: She is alert and oriented to person, place, and time.  Psychiatric:        Mood and Affect: Mood normal.        Behavior: Behavior normal.        Thought Content: Thought content normal.        Judgment: Judgment normal.    Diagnostics: Spirometry deferred due to remaining symptoms after having COVID  Assessment and Plan: 1. Moderate persistent asthma without complication   2. Cough   3. Seasonal and perennial allergic rhinitis   4. Seasonal allergic conjunctivitis   5. Gastroesophageal reflux disease, unspecified whether esophagitis present   6. Allergy with anaphylaxis due to  food   7. Tobacco use   8. History of COVID-19     Meds ordered this encounter  Medications   benzonatate (TESSALON PERLES) 100 MG capsule    Sig: Take 1 capsule (100 mg total) by mouth 2 (two) times daily as needed for cough.    Dispense:  30 capsule    Refill:  0     Patient Instructions  Asthma Set an alarm on your phone (for twice a day) to help you remember to take your medications.   Continue Spiriva 1.25 mcg 2 puffs once a day to prevent cough or wheeze Continue montelukast 10 mg once a day to prevent cough or wheeze Continue Symbicort 160-2 puffs twice a day with a spacer to prevent cough or wheeze Continue Xopenex once every 4-6 hours as needed for cough or wheeze Continue Xolair injections 375 mg once every 2 weeks and have access to an epinephrine autoinjector set For asthma flares, begin Flovent 110-2 puffs twice a day for 2 weeks, then stop  Allergic rhinitis Continue allergen avoidance measures directed toward grass pollen, weed pollen, ragweed pollen, tree pollen, mold, cat, dog, and cockroach Continue montelukast 10 mg once a day as listed above Continue Xyzal 5 mg once a day as needed for runny nose or itch Continue saline nasal rinses as needed for nasal symptoms. Use this before any medicated nasal sprays for best result Continue Nasacort 2 sprays in each nostril once a day   Reflux Continue famotidine to 40 mg once a day to prevent reflux Continue dietary and lifestyle modifications as listed below Continue Nexium 40 mg once a day as previously prescribed  Cough Continue the treatment plans as listed above Continue Tessalon Perles 100 mg up to three times a day as needed for cough  Allergic conjunctivitis Continue Pataday eyedrops 1 drop in each eye once a day as needed for red or itchy eyes  Food allergy Continue to avoid shellfish. In case of an allergic reaction, take Benadryl 50 mg  every 4 hours, and if life-threatening symptoms occur, inject with  EpiPen 0.3 mg.  Tobacco Continue to cut down or quit.  Written materials diet from [employer]  Call the clinic if this treatment plan is not working well for you  Follow up in 2 months or sooner if needed.   Return in about 2 months (around 07/27/2021), or if symptoms worsen or fail to improve.    Thank you for the opportunity to care for this patient.  Please do not hesitate to contact me with questions.  Gareth [name], FNP [facility]

## 2021-05-26 ENCOUNTER — Ambulatory Visit (INDEPENDENT_AMBULATORY_CARE_PROVIDER_SITE_OTHER): Payer: Medicaid Other | Admitting: Family Medicine

## 2021-05-26 ENCOUNTER — Other Ambulatory Visit: Payer: Self-pay

## 2021-05-26 ENCOUNTER — Ambulatory Visit: Payer: Medicaid Other | Admitting: Family Medicine

## 2021-05-26 ENCOUNTER — Encounter: Payer: Self-pay | Admitting: Family Medicine

## 2021-05-26 VITALS — BP 120/78 | HR 94 | Temp 98.0°F | Resp 18

## 2021-05-26 DIAGNOSIS — R059 Cough, unspecified: Secondary | ICD-10-CM | POA: Diagnosis not present

## 2021-05-26 DIAGNOSIS — J3089 Other allergic rhinitis: Secondary | ICD-10-CM

## 2021-05-26 DIAGNOSIS — H1013 Acute atopic conjunctivitis, bilateral: Secondary | ICD-10-CM | POA: Diagnosis not present

## 2021-05-26 DIAGNOSIS — Z8616 Personal history of COVID-19: Secondary | ICD-10-CM | POA: Insufficient documentation

## 2021-05-26 DIAGNOSIS — Z720 Tobacco use: Secondary | ICD-10-CM

## 2021-05-26 DIAGNOSIS — T7800XA Anaphylactic reaction due to unspecified food, initial encounter: Secondary | ICD-10-CM

## 2021-05-26 DIAGNOSIS — H1010 Acute atopic conjunctivitis, unspecified eye: Secondary | ICD-10-CM

## 2021-05-26 DIAGNOSIS — J4540 Moderate persistent asthma, uncomplicated: Secondary | ICD-10-CM

## 2021-05-26 DIAGNOSIS — J302 Other seasonal allergic rhinitis: Secondary | ICD-10-CM

## 2021-05-26 DIAGNOSIS — K219 Gastro-esophageal reflux disease without esophagitis: Secondary | ICD-10-CM

## 2021-05-26 MED ORDER — BENZONATATE 100 MG PO CAPS: 100.0000 mg | ORAL_CAPSULE | Freq: Two times a day (BID) | ORAL | 0 refills | Status: DC | PRN

## 2021-05-27 ENCOUNTER — Encounter: Payer: Medicaid Other | Admitting: Physical Therapy

## 2021-05-28 ENCOUNTER — Telehealth: Payer: Self-pay | Admitting: *Deleted

## 2021-05-28 ENCOUNTER — Encounter (HOSPITAL_BASED_OUTPATIENT_CLINIC_OR_DEPARTMENT_OTHER): Payer: Self-pay

## 2021-05-28 ENCOUNTER — Other Ambulatory Visit: Payer: Self-pay

## 2021-05-28 ENCOUNTER — Emergency Department (HOSPITAL_BASED_OUTPATIENT_CLINIC_OR_DEPARTMENT_OTHER): Payer: Medicaid Other

## 2021-05-28 ENCOUNTER — Encounter: Payer: Self-pay | Admitting: *Deleted

## 2021-05-28 ENCOUNTER — Emergency Department (HOSPITAL_BASED_OUTPATIENT_CLINIC_OR_DEPARTMENT_OTHER)
Admission: EM | Admit: 2021-05-28 | Discharge: 2021-05-28 | Disposition: A | Discharge status: Home / Self Care | Payer: Medicaid Other | Attending: Emergency Medicine | Admitting: Emergency Medicine

## 2021-05-28 DIAGNOSIS — Z79899 Other long term (current) drug therapy: Secondary | ICD-10-CM | POA: Diagnosis not present

## 2021-05-28 DIAGNOSIS — Z8616 Personal history of COVID-19: Secondary | ICD-10-CM | POA: Diagnosis not present

## 2021-05-28 DIAGNOSIS — J45901 Unspecified asthma with (acute) exacerbation: Secondary | ICD-10-CM | POA: Insufficient documentation

## 2021-05-28 DIAGNOSIS — Z7952 Long term (current) use of systemic steroids: Secondary | ICD-10-CM | POA: Insufficient documentation

## 2021-05-28 DIAGNOSIS — F17210 Nicotine dependence, cigarettes, uncomplicated: Secondary | ICD-10-CM | POA: Diagnosis not present

## 2021-05-28 DIAGNOSIS — I10 Essential (primary) hypertension: Secondary | ICD-10-CM | POA: Insufficient documentation

## 2021-05-28 DIAGNOSIS — R0602 Shortness of breath: Secondary | ICD-10-CM | POA: Diagnosis present

## 2021-05-28 LAB — BRAIN NATRIURETIC PEPTIDE: B Natriuretic Peptide: 22 pg/mL (ref 0.0–100.0)

## 2021-05-28 MED ORDER — PREDNISONE 20 MG PO TABS: 40.0000 mg | ORAL_TABLET | Freq: Every day | ORAL | 0 refills | Status: DC

## 2021-05-28 MED ORDER — GUAIFENESIN-DM 100-10 MG/5ML PO SYRP: 5.0000 mL | ORAL_SOLUTION | Freq: Three times a day (TID) | ORAL | 0 refills | Status: DC | PRN

## 2021-05-28 MED ORDER — METHYLPREDNISOLONE SODIUM SUCC 125 MG IJ SOLR
125.0000 mg | Freq: Once | INTRAMUSCULAR | Status: AC
  Administered 2021-05-28: 125 mg via INTRAMUSCULAR
  Filled 2021-05-28: qty 2

## 2021-05-28 NOTE — ED Provider Notes (Signed)
[facility] EMERGENCY DEPT Provider Note   CSN: [id number] Arrival date & time: 05/28/21  0325     History Chief Complaint  Patient presents with   Shortness of Breath    Penny [name] is a 40 y.o. female.   Shortness of Breath Associated symptoms: cough and wheezing   Associated symptoms: no abdominal pain, no fever and no rash   Patient presented shortness of breath and cough.  End of June patient was COVID-positive.  Has a history of asthma.  Sees asthma and allergy for it.  Had been seen on 9 July for hemoptysis.  Had negative CT angiography at that time.  Had been on steroids for the COVID but not on steroids as of the ninth.  States she was coughing more.  States she have trouble sleeping and laying flat because she is coughing and gets more short of breath lying back.  No fevers.  Still has a cough.  No real sputum production.  States she has used her 3 inhalers and they have not helped.  Patient states she is on Singulair for when she flares up.  The allergy note from 2 days ago states she was starting Flovent for when she flares up but patient states she is no longer on the Flovent and that was from previous.    Past Medical History:  Diagnosis Date   ADHD (attention deficit hyperactivity disorder)    Anxiety    no meds   Asthma    Chronic back pain    Depression    no meds   Essential hypertension    a. Dx 06/2016.   Gastroesophageal reflux disease 04/22/2021   Migraines    last one over 1 month ago   Overweight    Palpitations - Sinus Tachycardia    a. 12/2016 Echo: E f55-60%, no rwma, nl LA/RA sizes.   Placenta previa    w/ 2 pregnancies.   Pre-diabetes    Preterm labor    c/s at 36 wks   Recurrent upper respiratory infection (URI)    Scoliosis    Sinus tachycardia     Patient Active Problem List   Diagnosis Date Noted   History of COVID-19 05/26/2021   Cough 05/26/2021   Moderate persistent asthma without complication 22/63/3354    Seasonal and perennial allergic rhinitis 04/22/2021   Seasonal allergic conjunctivitis 04/22/2021   Allergy with anaphylaxis due to food 04/22/2021   Gastroesophageal reflux disease 04/22/2021   Tobacco use 04/22/2021   Moderate persistent asthma with acute exacerbation 08/01/2020   Uterine fibroid 01/23/2016   Right groin pain 01/23/2016   History of C-section 01/23/2016   Migraines    Anxiety    Depression     Past Surgical History:  Procedure Laterality Date   CESAREAN SECTION  2010   x 1 at 36 wks in [place]   CESAREAN SECTION  February 13, 2012   CHROMOPERTUBATION  10/26/2015   Procedure: ATTEMPTED CHROMOPERTUBATION;  Surgeon: Crawford Givens, MD;  Location: [facility];  Service: Gynecology;;   LAPAROSCOPIC LYSIS OF ADHESIONS  10/26/2015   Procedure: LAPAROSCOPIC LYSIS OF ADHESIONS;  Surgeon: Crawford Givens, MD;  Location: [facility];  Service: Gynecology;;   LAPAROSCOPY N/A 10/26/2015   Procedure: LAPAROSCOPY OPERATIVE WITH REMOVAL OF MISPLACED FILSHE CLIP;  Surgeon: Crawford Givens, MD;  Location: [facility];  Service: Gynecology;  Laterality: N/A;   SVD  2003   x 1 in [place]   TUBAL LIGATION     WISDOM TOOTH EXTRACTION  OB History     Gravida  3   Para  3   Term  1   Preterm  2   AB  0   Living  3      SAB  0   IAB  0   Ectopic  0   Multiple  0   Live Births  3           Family History  Problem Relation Age of Onset   Cancer Mother    CAD Mother    Hypertension Sister    Sickle cell trait Daughter    Asthma Son    Asthma Paternal Grandmother    Pseudochol deficiency Neg Hx    Malignant hyperthermia Neg Hx    Anesthesia problems Neg Hx    Hypotension Neg Hx     Social History   Tobacco Use   Smoking status: Every Day    Packs/day: 0.10    Years: 18.00    Pack years: 1.80    Types: Cigarettes   Smokeless tobacco: Never  Vaping Use   Vaping Use: Former  Substance Use Topics   Alcohol use: Yes    Comment: Rare    Drug use: Not Currently     Types: Marijuana    Home Medications Prior to Admission medications   Medication Sig Start Date End Date Taking? Authorizing Provider  guaiFENesin-dextromethorphan (ROBITUSSIN DM) 100-10 MG/5ML syrup Take 5 mLs by mouth 3 (three) times daily as needed for cough. 05/28/21  Yes Davonna Belling, MD  predniSONE (DELTASONE) 20 MG tablet Take 2 tablets (40 mg total) by mouth daily. 05/28/21  Yes Davonna Belling, MD  ACCU-CHEK GUIDE test strip USE 1 strip 2 TIMES DAILY BEFORE MEALS 04/23/20   [provider]  Accu-Chek Softclix Lancets lancets 2 (two) times daily. 04/24/20   [provider]  benzonatate (TESSALON PERLES) 100 MG capsule Take 1 capsule (100 mg total) by mouth 2 (two) times daily as needed for cough. 05/26/21 05/26/22  Dara Hoyer, FNP  Blood Glucose Monitoring Suppl (ACCU-CHEK GUIDE) w/Device KIT See admin instructions. 04/23/20   [provider]  budesonide-formoterol (SYMBICORT) 160-4.5 MCG/ACT inhaler Inhale 2 puffs into the lungs in the morning and at bedtime. 08/01/20   Faustino Congress, NP  EPINEPHrine 0.3 mg/0.3 mL IJ SOAJ injection SMARTSIG:1 Syringe(s) IM PRN 01/14/20   [provider]  esomeprazole (NEXIUM) 40 MG capsule Take 1 capsule (40 mg total) by mouth daily. 08/14/20   Vanessa Kick, MD  famotidine (PEPCID) 40 MG tablet Take 40 mg by mouth every evening. 01/07/21   [provider]  fluticasone (FLOVENT HFA) 220 MCG/ACT inhaler Inhale 2 puffs into the lungs in the morning and at bedtime. Patient not taking: Reported on 05/26/2021 04/29/21   Dara Hoyer, FNP  gabapentin (NEURONTIN) 800 MG tablet Take 800 mg by mouth 3 (three) times daily.    [provider]  levalbuterol (XOPENEX HFA) 45 MCG/ACT inhaler Inhale 1 puff every 4-6 hours as needed for cough or wheeze. 04/29/21   Dara Hoyer, FNP  levalbuterol (XOPENEX) 1.25 MG/3ML nebulizer solution Take 1.25 mg by nebulization every 4 (four) hours as needed for wheezing. 04/29/21    Ambs, Kathrine Cords, FNP  levocetirizine (XYZAL) 5 MG tablet Take 1 tablet (5 mg total) by mouth every evening. 07/29/20   Volney American, PA-C  levonorgestrel (MIRENA, 52 MG,) 20 MCG/24HR IUD Mirena 20 mcg/24 hours (7 yrs) 52 mg intrauterine device  Take by intrauterine  route.    [provider]  metoprolol succinate (TOPROL-XL) 25 MG 24 hr tablet Take 25 mg by mouth 2 (two) times daily. 07/29/20   [provider]  montelukast (SINGULAIR) 10 MG tablet Take 1 tablet (10 mg total) by mouth at bedtime. 04/29/21   Dara Hoyer, FNP  nicotine (NICODERM CQ - DOSED IN MG/24 HOURS) 14 mg/24hr patch Place 1 patch (14 mg total) onto the skin daily. Patient not taking: Reported on 05/26/2021 08/27/20   Valentina Shaggy, MD  Olopatadine HCl 0.7 % SOLN Apply 1 drop to eye daily. 04/29/21   Dara Hoyer, FNP  oxyCODONE-acetaminophen (PERCOCET/ROXICET) 5-325 MG tablet Take 1 tablet by mouth every 6 (six) hours as needed for severe pain. 05/22/21   Lacretia Leigh, MD  promethazine (PHENERGAN) 12.5 MG tablet Take 1 tablet (12.5 mg total) by mouth every 6 (six) hours as needed for nausea or vomiting. 05/07/21   Valentina Shaggy, MD  QUEtiapine (SEROQUEL) 25 MG tablet Take 25 mg by mouth at bedtime.    [provider]  Tiotropium Bromide Monohydrate (SPIRIVA RESPIMAT) 1.25 MCG/ACT AERS Inhale 2 puffs into the lungs daily. 05/21/21   Ambs, Kathrine Cords, FNP  tiZANidine (ZANAFLEX) 4 MG tablet Take 4 mg by mouth every 6 (six) hours as needed for muscle spasms.    [provider]  Cetirizine HCl 10 MG CAPS Take 1 capsule (10 mg total) by mouth daily for 10 days. Patient not taking: Reported on 06/22/2019 02/11/19 08/18/19  Wieters, Elesa Hacker, PA-C  Dextromethorphan HBr 10 MG/5ML SYRP 10 ml qhs prn cough at night time. Patient not taking: Reported on 06/22/2019 02/04/19 08/18/19  Rodriguez-Southworth, Sunday Spillers, PA-C  fluticasone [facility]) 50 MCG/ACT nasal spray Place 1 spray into both nostrils daily.  05/27/20 05/28/20  Rudolpho Sevin, NP  norethindrone (AYGESTIN) 5 MG tablet Take by mouth. 05/07/20 05/28/20  [provider]  Topiramate ER (TROKENDI XR) 200 MG CP24 Take 1 capsule by mouth daily. 08/27/19 05/28/20  [provider]    Allergies    Other, Shrimp extract allergy skin test, Shellfish-derived products, and Shrimp [shellfish allergy]  Review of Systems   Review of Systems  Constitutional:  Negative for chills and fever.  HENT:  Negative for congestion.   Respiratory:  Positive for cough, shortness of breath and wheezing.   Gastrointestinal:  Negative for abdominal pain.  Genitourinary:  Negative for dysuria.  Musculoskeletal:  Negative for back pain.  Skin:  Negative for rash.  Neurological:  Negative for weakness.  Psychiatric/Behavioral:  Negative for confusion.    Physical Exam Updated Vital Signs BP 122/79 (BP Location: Right Arm)   Pulse 80   Temp 98.8 F (37.1 C) (Oral)   Resp 18   Ht '5\' 4"'  (1.626 m)   Wt 90.7 kg   SpO2 97%   BMI 34.32 kg/m   Physical Exam Vitals and nursing note reviewed.  HENT:     Head: Normocephalic.  Cardiovascular:     Rate and Rhythm: Normal rate and regular rhythm.  Pulmonary:     Breath sounds: Wheezing present. No rhonchi or rales.  Chest:     Chest wall: No tenderness.  Musculoskeletal:     Cervical back: Neck supple.     Right lower leg: No edema.     Left lower leg: No edema.  Skin:    General: Skin is warm.     Capillary Refill: Capillary refill takes less than 2 seconds.  Neurological:  Mental Status: She is alert and oriented to person, place, and time.    ED Results / Procedures / Treatments   Labs (all labs ordered are listed, but only abnormal results are displayed) Labs Reviewed  BRAIN NATRIURETIC PEPTIDE    EKG EKG Interpretation  Date/Time:  Friday May 28 2021 03:26:22 EDT Ventricular Rate:  88 PR Interval:  156 QRS Duration: 78 QT Interval:  368 QTC Calculation: 445 R  Axis:   67 Text Interpretation: Normal sinus rhythm Normal ECG Confirmed by Davonna Belling [phone]) on 05/28/2021 5:51:17 AM  Radiology DG Chest Portable 1 View  Result Date: 05/28/2021 CLINICAL DATA:  Shortness of breath with exertion, unable to lay flat EXAM: PORTABLE CHEST 1 VIEW COMPARISON:  CT 05/22/2021, radiograph 05/22/2021 FINDINGS: Slight pulmonary vascular redistribution. Prominent cardiac silhouette though may be accentuated by portable technique and chest wall deformity. Few peripheral septal lines and fissural thickening. No confluent opacity, visible pneumothorax or effusion. Redemonstration of marked dextrocurvature of the midthoracic spine and associated chest wall deformity. Telemetry leads overlie the chest. IMPRESSION: Features suggestive of mild CHF or volume overload given pulmonary vascular redistribution and features of interstitial edema including septal and fissural thickening. Prominent cardiomediastinal silhouette though may be accentuated by technique and known chest wall deformity with redemonstrated dextrocurvature of the spine. Electronically Signed   By: Lovena Le M.D.   On: 05/28/2021 04:00    Procedures Procedures   Medications Ordered in ED Medications  methylPREDNISolone sodium succinate (SOLU-MEDROL) 125 mg/2 mL injection 125 mg (125 mg Intramuscular Given 05/28/21 0404)    ED Course  I have reviewed the triage vital signs and the nursing notes.  Pertinent labs & imaging results that were available during my care of the patient were reviewed by me and considered in my medical decision making (see chart for details).    MDM Rules/Calculators/A&P                          Patient is wheezing shortness of breath.  Worse coughing with laying flat.  Denies nasal congestion.  Does have history of asthma.  Also somewhat recent COVID.  Did have recent work-up and negative CT angiography without blood clot.  Patient did have difficulty laying flat here.   Frequent coughing.  Not hypoxic however.  Chest x-ray done and showed potential volume overload.  Had BNP done that was normal however.  No edema in her legs.  I think this is likely not a CHF.  Given IM Solu-Medrol because patient states that works well.  States that almost immediately after getting the shot she began to feel better.  We will give 5-day course of steroids as an outpatient.  Follow-up with PCP and allergist as needed.  Also added cough medicine.  Discharge home. Final Clinical Impression(s) / ED Diagnoses Final diagnoses:  Asthma with acute exacerbation, unspecified asthma severity, unspecified whether persistent    Rx / DC Orders ED Discharge Orders          Ordered    guaiFENesin-dextromethorphan (ROBITUSSIN DM) 100-10 MG/5ML syrup  3 times daily PRN        05/28/21 0517    predniSONE (DELTASONE) 20 MG tablet  Daily        05/28/21 0517             Davonna Belling, MD 05/28/21 [phone]

## 2021-05-28 NOTE — Progress Notes (Signed)
[place] [address] Dept: [phone]  FOLLOW UP NOTE  Patient ID: Penny [name], female    DOB: 09/16/1981  Age: 40 y.o. MRN: [id number] Date of Office Visit: 05/26/2021  Assessment  Chief Complaint: Asthma (Had covid end of June tested positive on June 30th)  HPI Penny [name] is a 40 year old female who presents to the clinic for follow-up visit.  She was last seen in this clinic on 04/29/2021 for evaluation of asthma, allergic rhinitis, allergic conjunctivitis, reflux, food allergy, and tobacco use.  In the interim, she was diagnosed with COVID on 05/13/2021.  She went to ED on 05/22/2021 with hemoptysis where she was noted to have a negative CT scan and a negative chest x-ray.  At today's visit, she reports that her asthma has been much more well controlled, however, she continues to have some chest tightness, some shortness of breath and some cough which is occasionally dry and occasionally producing clear mucus.  She denies hemoptysis since her visit to the ED.  She continues montelukast 10 mg once a day, Symbicort 160-2 puffs twice a day, Spiriva 1.25 mcg 2 puffs once a day, Xopenex at least once a day, and Xolair injections 375 mg once every 2 weeks.  Allergic rhinitis is reported as much more well controlled with symptoms including occasional sneeze and itching in her throat.  She continues Xyzal 5 mg once a day and Nasacort 2 sprays in each nostril once a day.  Allergic conjunctivitis is reported as well controlled with olopatadine as needed.  Reflux is reported as well controlled with no symptoms of heartburn or vomiting.  She continues famotidine 40 mg once a day and Nexium 40 mg once a day.  She continues to avoid shellfish with no accidental ingestion or EpiPen use since her last visit to this clinic.  She reports that she has recently significantly reduced her tobacco use to 1 to 2 cigarettes a day.  Her current medications are listed in  the chart.   Drug Allergies:  Allergies  Allergen Reactions   Other Itching and Swelling    Itching and swelling in mouth and face   Shrimp Extract Allergy Skin Test Swelling   Shellfish-Derived Products    Shrimp [Shellfish Allergy] Itching and Swelling    Itching and swelling in mouth and face    Physical Exam: BP 120/78   Pulse 94   Temp 98 F (36.7 C) (Temporal)   Resp 18   SpO2 96%    Physical Exam Vitals reviewed.  Constitutional:      Appearance: Normal appearance.  HENT:     Head: Normocephalic and atraumatic.     Right Ear: Tympanic membrane normal.     Left Ear: Tympanic membrane normal.     Nose:     Comments: Bilateral nares normal. Pharynx normal. Ears normal. Eyes normal.    Mouth/Throat:     Pharynx: Oropharynx is clear.  Eyes:     Conjunctiva/sclera: Conjunctivae normal.  Cardiovascular:     Rate and Rhythm: Normal rate and regular rhythm.     Heart sounds: Normal heart sounds. No murmur heard. Pulmonary:     Effort: Pulmonary effort is normal.     Breath sounds: Normal breath sounds.     Comments: Lungs clear to auscultation Musculoskeletal:        General: Normal range of motion.     Cervical back: Normal range of motion and neck supple.  Skin:  General: Skin is warm and dry.  Neurological:     Mental Status: She is alert and oriented to person, place, and time.  Psychiatric:        Mood and Affect: Mood normal.        Behavior: Behavior normal.        Thought Content: Thought content normal.        Judgment: Judgment normal.    Diagnostics: Spirometry deferred due to remaining symptoms after having COVID  Assessment and Plan: 1. Moderate persistent asthma without complication   2. Cough   3. Seasonal and perennial allergic rhinitis   4. Seasonal allergic conjunctivitis   5. Gastroesophageal reflux disease, unspecified whether esophagitis present   6. Allergy with anaphylaxis due to food   7. Tobacco use   8. History of COVID-19      Meds ordered this encounter  Medications   benzonatate (TESSALON PERLES) 100 MG capsule    Sig: Take 1 capsule (100 mg total) by mouth 2 (two) times daily as needed for cough.    Dispense:  30 capsule    Refill:  0    Patient Instructions  Asthma Set an alarm on your phone (for twice a day) to help you remember to take your medications.   Continue Spiriva 1.25 mcg 2 puffs once a day to prevent cough or wheeze Continue montelukast 10 mg once a day to prevent cough or wheeze Continue Symbicort 160-2 puffs twice a day with a spacer to prevent cough or wheeze Continue Xopenex once every 4-6 hours as needed for cough or wheeze Continue Xolair injections 375 mg once every 2 weeks and have access to an epinephrine autoinjector set For asthma flares, begin Flovent 110-2 puffs twice a day for 2 weeks, then stop  Allergic rhinitis Continue allergen avoidance measures directed toward grass pollen, weed pollen, ragweed pollen, tree pollen, mold, cat, dog, and cockroach Continue montelukast 10 mg once a day as listed above Continue Xyzal 5 mg once a day as needed for runny nose or itch Continue saline nasal rinses as needed for nasal symptoms. Use this before any medicated nasal sprays for best result Continue Nasacort 2 sprays in each nostril once a day   Reflux Continue famotidine to 40 mg once a day to prevent reflux Continue dietary and lifestyle modifications as listed below Continue Nexium 40 mg once a day as previously prescribed  Cough Continue the treatment plans as listed above Continue Tessalon Perles 100 mg up to three times a day as needed for cough  Allergic conjunctivitis Continue Pataday eyedrops 1 drop in each eye once a day as needed for red or itchy eyes  Food allergy Continue to avoid shellfish. In case of an allergic reaction, take Benadryl 50 mg  every 4 hours, and if life-threatening symptoms occur, inject with EpiPen 0.3 mg.  Tobacco Continue to cut down or  quit.  Written materials diet from Up-To-Date  Call the clinic if this treatment plan is not working well for you  Follow up in 2 months or sooner if needed.     Return in about 2 months (around 07/27/2021), or if symptoms worsen or fail to improve.    Thank you for the opportunity to care for this patient.  Please do not hesitate to contact me with questions.  Gareth [name], FNP [facility] [place] [facility] [place]

## 2021-05-28 NOTE — Telephone Encounter (Signed)
Patient called and spoke with me in regards to the ER visit that she had this morning. She stated that she was concerned about the chest x ray results since it did state possible CHF and fluid in her lungs. I called and spoke with Webb Silversmith about this and she reassured that the blood work done in correlation with the Chest x ray was reassuring and did not point to any CHF. She did stated that she would like for the patient to go ahead and follow up with her PCP in regards to the fluid in her lungs. She did stated that if the patient was not feeling better with her asthma by Monday she could come in and see Webb Silversmith. Called patient and reviewed Anne's findings and recommendations to her. Patient verbalized understanding and has scheduled for a follow up visit with Webb Silversmith this coming Monday in [facility].

## 2021-05-28 NOTE — ED Notes (Signed)
Pt verbalized understanding of discharge instructions, walked out under on will. No distress noted. No questions at this time

## 2021-05-28 NOTE — ED Triage Notes (Signed)
Patient BIB [facility] from Home with SOB.   Patient tested Positive for COVID-19 May 13, 2021 and was recently discharged from Hospital Admission approximately 1 week ago with same symptoms.   Most recent Episode began yesterday and Home Medications/Interventions have been unsuccessful.  VSS with EMS. A&Ox4. BIB Wheelchair.  Hx of Asthma and Bronchitis.

## 2021-05-28 NOTE — ED Notes (Signed)
Pt feels short of breath with exertion, can not lay flat.

## 2021-05-31 ENCOUNTER — Other Ambulatory Visit: Payer: Self-pay

## 2021-05-31 ENCOUNTER — Ambulatory Visit (INDEPENDENT_AMBULATORY_CARE_PROVIDER_SITE_OTHER): Payer: Medicaid Other | Admitting: Family Medicine

## 2021-05-31 ENCOUNTER — Encounter: Payer: Self-pay | Admitting: Family Medicine

## 2021-05-31 VITALS — BP 116/70 | HR 92 | Temp 97.9°F | Resp 24 | Ht 63.0 in | Wt 198.4 lb

## 2021-05-31 DIAGNOSIS — H1013 Acute atopic conjunctivitis, bilateral: Secondary | ICD-10-CM

## 2021-05-31 DIAGNOSIS — R059 Cough, unspecified: Secondary | ICD-10-CM | POA: Diagnosis not present

## 2021-05-31 DIAGNOSIS — J3089 Other allergic rhinitis: Secondary | ICD-10-CM | POA: Diagnosis not present

## 2021-05-31 DIAGNOSIS — J4541 Moderate persistent asthma with (acute) exacerbation: Secondary | ICD-10-CM | POA: Diagnosis not present

## 2021-05-31 DIAGNOSIS — H1010 Acute atopic conjunctivitis, unspecified eye: Secondary | ICD-10-CM

## 2021-05-31 DIAGNOSIS — J302 Other seasonal allergic rhinitis: Secondary | ICD-10-CM

## 2021-05-31 DIAGNOSIS — T7800XA Anaphylactic reaction due to unspecified food, initial encounter: Secondary | ICD-10-CM

## 2021-05-31 DIAGNOSIS — Z8616 Personal history of COVID-19: Secondary | ICD-10-CM

## 2021-05-31 DIAGNOSIS — Z720 Tobacco use: Secondary | ICD-10-CM

## 2021-05-31 DIAGNOSIS — K219 Gastro-esophageal reflux disease without esophagitis: Secondary | ICD-10-CM

## 2021-05-31 MED ORDER — LEVALBUTEROL HCL 1.25 MG/3ML IN NEBU: 1.2500 mg | INHALATION_SOLUTION | RESPIRATORY_TRACT | 1 refills | Status: AC | PRN

## 2021-05-31 NOTE — Progress Notes (Signed)
[place] [address] Dept: [phone]  FOLLOW UP NOTE  Patient ID: Penny [name], female    DOB: Nov 28, 1980  Age: 40 y.o. MRN: [id number] Date of Office Visit: 05/31/2021  Assessment  Chief Complaint: Asthma  HPI Penny [name] is a 40 year old female who presents to the clinic for follow-up visit.  She was last seen in this clinic on 05/26/2021 for evaluation of asthma, allergic rhinitis, reflux, cough, allergic conjunctivitis, food allergy, and tobacco use.  In the interim, she went to the emergency department on 05/28/2021 for asthma exacerbation where she did receive a steroid taper.  At today's visit, she reports that her chest is tight and it hurts to breathe.  She denies any pain while not breathing.  She is currently taking oxycodone for pain associated with breathing.  She denies shortness of breath and wheeze with activity or rest.  She does report cough which is producing a mucus that looks like " boogers" as well as dry cough.  She denies hemoptysis since her last visit to the ED.  She did have a portable chest x-ray in the ED that showed possible fluid accumulation, however, she had a BNP which was well within normal limit.  She continues montelukast 10 mg once a day, Symbicort 160-2 puffs twice a day, Spiriva 1.25 mcg 2 puff once a day, Xopenex about twice a day, and Xolair injections 375 mg once every 2 weeks.  Allergic rhinitis is reported as moderately well controlled with occasional postnasal drainage for which she continues Xyzal 5 mg once a day and Nasacort 2 sprays in each nostril once a day.  Allergic conjunctivitis is reported as well controlled with olopatadine as needed.  Reflux is reported as well controlled with no symptoms including heartburn or vomiting.  She continues famotidine 40 mg once a day and Nexium 40 mg once a day.  She continues to avoid shellfish with no accidental ingestion or EpiPen use since her last visit to this clinic.  She  reports that she has reduced her tobacco intake to a few cigarettes a day.  Her current medications are listed in the chart. Of note, she has not previously visited an ear nose and throat specialist.  She is also interested in referral or enrollment to pulmonary rehab for long-haul COVID patients.  Drug Allergies:  Allergies  Allergen Reactions   Other Itching and Swelling    Itching and swelling in mouth and face   Shrimp Extract Allergy Skin Test Swelling   Shellfish-Derived Products    Shrimp [Shellfish Allergy] Itching and Swelling    Itching and swelling in mouth and face    Physical Exam: BP 116/70 (BP Location: Right Arm, Patient Position: Sitting, Cuff Size: Normal)   Pulse 92   Temp 97.9 F (36.6 C) (Temporal)   Resp (!) 24   Ht 5\' 3"  (1.6 m)   Wt 198 lb 6.4 oz (90 kg)   SpO2 97%   BMI 35.14 kg/m    Physical Exam Vitals reviewed.  Constitutional:      Appearance: Normal appearance.  HENT:     Head: Normocephalic and atraumatic.     Right Ear: Tympanic membrane normal.     Left Ear: Tympanic membrane normal.     Nose:     Comments: Bilateral nares slightly erythematous with clear nasal drainage noted.  Pharynx normal.  Ears normal.  Eyes normal.    Mouth/Throat:     Pharynx: Oropharynx is clear.  Eyes:  Conjunctiva/sclera: Conjunctivae normal.  Cardiovascular:     Rate and Rhythm: Normal rate and regular rhythm.     Heart sounds: Normal heart sounds. No murmur heard. Pulmonary:     Effort: Pulmonary effort is normal.     Breath sounds: Normal breath sounds.     Comments: Lungs clear to auscultation Musculoskeletal:        General: Normal range of motion.     Cervical back: Normal range of motion and neck supple.  Skin:    General: Skin is warm and dry.  Neurological:     Mental Status: She is alert and oriented to person, place, and time.  Psychiatric:        Mood and Affect: Mood normal.        Behavior: Behavior normal.        Thought Content:  Thought content normal.        Judgment: Judgment normal.    Diagnostics: Deferred due to cough  Assessment and Plan: 1. Moderate persistent asthma with acute exacerbation   2. Cough   3. Seasonal and perennial allergic rhinitis   4. Seasonal allergic conjunctivitis   5. Tobacco use   6. Gastroesophageal reflux disease, unspecified whether esophagitis present   7. Allergy with anaphylaxis due to food   8. History of COVID-19     Meds ordered this encounter  Medications   levalbuterol (XOPENEX) 1.25 MG/3ML nebulizer solution    Sig: Take 1.25 mg by nebulization every 4 (four) hours as needed for wheezing.    Dispense:  72 mL    Refill:  1     Patient Instructions  Asthma Set an alarm on your phone (for twice a day) to help you remember to take your medications.   Continue Spiriva 1.25 mcg 2 puffs once a day to prevent cough or wheeze Continue montelukast 10 mg once a day to prevent cough or wheeze Continue Symbicort 160-2 puffs twice a day with a spacer to prevent cough or wheeze Continue Xopenex once every 4-6 hours as needed for cough or wheeze Continue Xolair injections 375 mg once every 2 weeks and have access to an epinephrine autoinjector set For asthma flares, begin Flovent 110-2 puffs twice a day for 2 weeks, then stop  Allergic rhinitis Continue allergen avoidance measures directed toward grass pollen, weed pollen, ragweed pollen, tree pollen, mold, cat, dog, and cockroach Continue montelukast 10 mg once a day as listed above Continue Xyzal 5 mg once a day as needed for runny nose or itch Continue saline nasal rinses as needed for nasal symptoms. Use this before any medicated nasal sprays for best result Continue Nasacort 2 sprays in each nostril once a day   Reflux Continue famotidine to 40 mg once a day to prevent reflux Continue dietary and lifestyle modifications as listed below Continue Nexium 40 mg once a day as previously prescribed  Cough Refer to Dr.  Mila Homer, [facility] group Continue the treatment plans as listed above Continue Tessalon Perles 100 mg up to three times a day as needed for cough  Allergic conjunctivitis Continue Pataday eyedrops 1 drop in each eye once a day as needed for red or itchy eyes  Food allergy Continue to avoid shellfish. In case of an allergic reaction, take Benadryl 50 mg  every 4 hours, and if life-threatening symptoms occur, inject with EpiPen 0.3 mg.  Tobacco Continue to cut down or quit.  Written materials diet from Up-To-Date  Call the clinic if this  treatment plan is not working well for you  Follow up in 1 month or sooner if needed.  Return in about 4 weeks (around 06/28/2021), or if symptoms worsen or fail to improve.    Thank you for the opportunity to care for this patient.  Please do not hesitate to contact me with questions.  Gareth [name], FNP [facility] [place] [facility] [place]

## 2021-05-31 NOTE — Patient Instructions (Addendum)
Asthma Set an alarm on your phone (for twice a day) to help you remember to take your medications.   Continue Spiriva 1.25 mcg 2 puffs once a day to prevent cough or wheeze Continue montelukast 10 mg once a day to prevent cough or wheeze Continue Symbicort 160-2 puffs twice a day with a spacer to prevent cough or wheeze Continue Xopenex once every 4-6 hours as needed for cough or wheeze Continue Xolair injections 375 mg once every 2 weeks and have access to an epinephrine autoinjector set For asthma flares, begin Flovent 110-2 puffs twice a day for 2 weeks, then stop  Allergic rhinitis Continue allergen avoidance measures directed toward grass pollen, weed pollen, ragweed pollen, tree pollen, mold, cat, dog, and cockroach Continue montelukast 10 mg once a day as listed above Continue Xyzal 5 mg once a day as needed for runny nose or itch Continue saline nasal rinses as needed for nasal symptoms. Use this before any medicated nasal sprays for best result Continue Nasacort 2 sprays in each nostril once a day   Reflux Continue famotidine to 40 mg once a day to prevent reflux Continue dietary and lifestyle modifications as listed below Continue Nexium 40 mg once a day as previously prescribed  Cough Refer to Dr. Mila Homer, [facility]NT group Continue the treatment plans as listed above Continue Tessalon Perles 100 mg up to three times a day as needed for cough  Allergic conjunctivitis Continue Pataday eyedrops 1 drop in each eye once a day as needed for red or itchy eyes  Food allergy Continue to avoid shellfish. In case of an allergic reaction, take Benadryl 50 mg  every 4 hours, and if life-threatening symptoms occur, inject with EpiPen 0.3 mg.  Tobacco Continue to cut down or quit.  Written materials diet from Up-To-Date  Call the clinic if this treatment plan is not working well for you  Follow up in 2 months or sooner if needed.   Lifestyle Changes for Controlling  GERD When you have GERD, stomach acid feels as if it's backing up toward your mouth. Whether or not you take medication to control your GERD, your symptoms can often be improved with lifestyle changes.   Raise Your Head Reflux is more likely to strike when you're lying down flat, because stomach fluid can flow backward more easily. Raising the head of your bed 4-6 inches can help. To do this: Slide blocks or books under the legs at the head of your bed. Or, place a wedge under the mattress. Many foam stores can make a suitable wedge for you. The wedge should run from your waist to the top of your head. Don't just prop your head on several pillows. This increases pressure on your stomach. It can make GERD worse.  Watch Your Eating Habits Certain foods may increase the acid in your stomach or relax the lower esophageal sphincter, making GERD more likely. It's best to avoid the following: Coffee, tea, and carbonated drinks (with and without caffeine) Fatty, fried, or spicy food Mint, chocolate, onions, and tomatoes Any other foods that seem to irritate your stomach or cause you pain  Relieve the Pressure Eat smaller meals, even if you have to eat more often. Don't lie down right after you eat. Wait a few hours for your stomach to empty. Avoid tight belts and tight-fitting clothes. Lose excess weight.  Tobacco and Alcohol Avoid smoking tobacco and drinking alcohol. They can make GERD symptoms worse.  Reducing Pollen Exposure The  American Academy of Allergy, Asthma and Immunology suggests the following steps to reduce your exposure to pollen during allergy seasons. Do not hang sheets or clothing out to dry; pollen may collect on these items. Do not mow lawns or spend time around freshly cut grass; mowing stirs up pollen. Keep windows closed at night.  Keep car windows closed while driving. Minimize morning activities outdoors, a time when pollen counts are usually at their  highest. Stay indoors as much as possible when pollen counts or humidity is high and on windy days when pollen tends to remain in the air longer. Use air conditioning when possible.  Many air conditioners have filters that trap the pollen spores. Use a HEPA room air filter to remove pollen form the indoor air you breathe.  Control of Dog or Cat Allergen Avoidance is the best way to manage a dog or cat allergy. If you have a dog or cat and are allergic to dog or cats, consider removing the dog or cat from the home. If you have a dog or cat but don't want to find it a new home, or if your family wants a pet even though someone in the household is allergic, here are some strategies that may help keep symptoms at bay:  Keep the pet out of your bedroom and restrict it to only a few rooms. Be advised that keeping the dog or cat in only one room will not limit the allergens to that room. Don't pet, hug or kiss the dog or cat; if you do, wash your hands with soap and water. High-efficiency particulate air (HEPA) cleaners run continuously in a bedroom or living room can reduce allergen levels over time. Regular use of a high-efficiency vacuum cleaner or a central vacuum can reduce allergen levels. Giving your dog or cat a bath at least once a week can reduce airborne allergen.  Control of Mold Allergen Mold and fungi can grow on a variety of surfaces provided certain temperature and moisture conditions exist.  Outdoor molds grow on plants, decaying vegetation and soil.  The major outdoor mold, Alternaria and Cladosporium, are found in very high numbers during hot and dry conditions.  Generally, a late Summer - Fall peak is seen for common outdoor fungal spores.  Rain will temporarily lower outdoor mold spore count, but counts rise rapidly when the rainy period ends.  The most important indoor molds are Aspergillus and Penicillium.  Dark, humid and poorly ventilated basements are ideal sites for mold growth.   The next most common sites of mold growth are the bathroom and the kitchen.  Outdoor [employer] Use air conditioning and keep windows closed Avoid exposure to decaying vegetation. Avoid leaf raking. Avoid grain handling. Consider wearing a face mask if working in moldy areas.  Indoor Mold Control Maintain humidity below 50%. Clean washable surfaces with 5% bleach solution. Remove sources e.g. Contaminated carpets.   Control of Cockroach Allergen Cockroach allergen has been identified as an important cause of acute attacks of asthma, especially in urban settings.  There are fifty-five species of cockroach that exist in the [place], however only three, the [place], [place] species produce allergen that can affect patients with Asthma.  Allergens can be obtained from fecal particles, egg casings and secretions from cockroaches.    Remove food sources. Reduce access to water. Seal access and entry points. Spray runways with 0.5-1% Diazinon or Chlorpyrifos Blow boric acid power under stoves and refrigerator. Place bait stations (hydramethylnon)  at feeding sites.

## 2021-06-01 ENCOUNTER — Encounter: Payer: Self-pay | Admitting: Family Medicine

## 2021-06-01 ENCOUNTER — Other Ambulatory Visit: Payer: Self-pay

## 2021-06-01 MED ORDER — FLUTICASONE PROPIONATE HFA 110 MCG/ACT IN AERO: INHALATION_SPRAY | RESPIRATORY_TRACT | 5 refills | Status: DC

## 2021-06-01 MED ORDER — FLUTICASONE PROPIONATE HFA 110 MCG/ACT IN AERO: INHALATION_SPRAY | RESPIRATORY_TRACT | 5 refills | Status: AC

## 2021-06-01 NOTE — Telephone Encounter (Signed)
Penny [name] cma has spoken with pt about the flovent

## 2021-06-03 ENCOUNTER — Telehealth: Payer: Self-pay

## 2021-06-03 ENCOUNTER — Other Ambulatory Visit: Payer: Self-pay

## 2021-06-03 ENCOUNTER — Encounter: Payer: Self-pay | Admitting: Physical Therapy

## 2021-06-03 ENCOUNTER — Ambulatory Visit: Payer: Medicaid Other | Attending: Nurse Practitioner | Admitting: Physical Therapy

## 2021-06-03 DIAGNOSIS — R1084 Generalized abdominal pain: Secondary | ICD-10-CM | POA: Diagnosis present

## 2021-06-03 DIAGNOSIS — M6281 Muscle weakness (generalized): Secondary | ICD-10-CM | POA: Insufficient documentation

## 2021-06-03 DIAGNOSIS — R252 Cramp and spasm: Secondary | ICD-10-CM | POA: Diagnosis present

## 2021-06-03 DIAGNOSIS — R102 Pelvic and perineal pain: Secondary | ICD-10-CM | POA: Insufficient documentation

## 2021-06-03 NOTE — Telephone Encounter (Signed)
Referral has been printed and waiting to be sent on Baileys desk once the patient responds back via mychart or phone call. I know she has to have transportation help, so I want to make sure she will be able to get to the appointment in [facility].   Thanks

## 2021-06-03 NOTE — Telephone Encounter (Signed)
Thank you Dee

## 2021-06-03 NOTE — Therapy (Signed)
[facility] [address] [place], [place], [address] Phone: [phone]   Fax:  [phone]  Physical Therapy Treatment  Patient Details  Name: Penny [name] MRN: [id number] Date of Birth: [birth year] Referring Provider (PT): Claris Gladden. Ouida Sills, NP   Encounter Date: 06/03/2021   PT End of Session - 06/03/21 1030     Visit Number 7    Date for PT Re-Evaluation 08/17/21    Authorization Type Medicaid    Authorization Time Period 5/26-8/17    Authorization - Visit Number 4    Authorization - Number of Visits 12    PT Start Time 0930    PT Stop Time 1010    PT Time Calculation (min) 40 min    Activity Tolerance Patient tolerated treatment well;No increased pain    Behavior During Therapy WFL for tasks assessed/performed             Past Medical History:  Diagnosis Date   ADHD (attention deficit hyperactivity disorder)    Anxiety    no meds   Asthma    Chronic back pain    Depression    no meds   Essential hypertension    a. Dx 06/2016.   Gastroesophageal reflux disease 04/22/2021   Migraines    last one over 1 month ago   Overweight    Palpitations - Sinus Tachycardia    a. 12/2016 Echo: E f55-60%, no rwma, nl LA/RA sizes.   Placenta previa    w/ 2 pregnancies.   Pre-diabetes    Preterm labor    c/s at 36 wks   Recurrent upper respiratory infection (URI)    Scoliosis    Sinus tachycardia     Past Surgical History:  Procedure Laterality Date   CESAREAN SECTION  2010   x 1 at 36 wks in [place]   CESAREAN SECTION  February 13, 2012   CHROMOPERTUBATION  10/26/2015   Procedure: ATTEMPTED CHROMOPERTUBATION;  Surgeon: Crawford Givens, MD;  Location: [facility];  Service: Gynecology;;   LAPAROSCOPIC LYSIS OF ADHESIONS  10/26/2015   Procedure: LAPAROSCOPIC LYSIS OF ADHESIONS;  Surgeon: Crawford Givens, MD;  Location: [facility];  Service: Gynecology;;   LAPAROSCOPY N/A 10/26/2015   Procedure: LAPAROSCOPY  OPERATIVE WITH REMOVAL OF MISPLACED FILSHE CLIP;  Surgeon: Crawford Givens, MD;  Location: [facility];  Service: Gynecology;  Laterality: N/A;   SVD  2003   x 1 in [place] EXTRACTION      There were no vitals filed for this visit.   Subjective Assessment - 06/03/21 0938     Subjective I had COVID for several weeks. The worse was the breathing.    Patient Stated Goals reduce her pain    Currently in Pain? Yes    Pain Score 8     Pain Location Pelvis    Pain Orientation Right    Pain Descriptors / Indicators Burning    Pain Type Acute pain    Pain Onset More than a month ago    Pain Frequency Constant    Aggravating Factors  movement    Pain Relieving Factors holding her stomach on the right side    Multiple Pain Sites No                [facility] PT Assessment - 06/03/21 0001       Assessment   Medical Diagnosis R10.2 pelvic and perineal pain  Referring Provider (PT) Claris Gladden. Ouida Sills, NP    Onset Date/Surgical Date 11/05/20    Prior Therapy had pelvic floor PT in the past      Precautions   Precautions None      Restrictions   Weight Bearing Restrictions No      Home Environment   Living Environment Private residence      Prior Function   Level of Independence Independent    Leisure walking daily      Cognition   Overall Cognitive Status Within Functional Limits for tasks assessed      Posture/Postural Control   Posture/Postural Control Postural limitations    Posture Comments rib hump on the upper right thoracic      AROM   Lumbar Flexion full   has to go slowly due to being stiff   Lumbar Extension full    Lumbar - Right Side Bend full    Lumbar - Right Rotation full    Lumbar - Left Rotation full      Strength   Right Hip Extension 4-/5    Right Hip ABduction 4/5    Right Hip ADduction 4/5    Left Hip Extension 4-/5    Left Hip ABduction 4/5    Left Hip ADduction 4/5      Palpation   SI assessment  ASIS are equal     Palpation comment tenderness throughout the abdomen, right groin                        Pelvic Floor Special Questions - 06/03/21 0001     Pelvic Floor Internal Exam Patient confirms identification and approves PT to assess pelvic floor and treatment    Exam Type Vaginal    Palpation tenderness located on the right obturator internist and right side of bladder    Strength fair squeeze, definite lift               [facility] Adult PT Treatment/Exercise - 06/03/21 0001       Lumbar Exercises: Stretches   Active Hamstring Stretch Right;Left;1 rep;30 seconds    Active Hamstring Stretch Limitations supine    Hip Flexor Stretch Right;Left;1 rep;30 seconds    Hip Flexor Stretch Limitations supine with leg off the mat      Lumbar Exercises: Aerobic   Stationary Bike level 1 for 5 minutes with several breaks due to breathing and gettingover COVID      Manual Therapy   Manual Therapy Internal Pelvic Floor    Manual therapy comments educated patient onhow to manually work on her right pelvic floor and she returned demonstration    Internal Pelvic Floor manual moilization to bilateral sides of the bladder,and right obturator internist with the other hand working externally on the tissue to elongate and monitor for pain                    PT Education - 06/03/21 1007     Education Details educated patient on how to perfrom manual mobilization to the pelvic floor on the right side.    Person(s) Educated Patient    Methods Explanation;Demonstration    Comprehension Verbalized understanding;Returned demonstration              PT Short Term Goals - 06/03/21 0949       PT SHORT TERM GOAL #1   Title pt to be I with intial HEP     Time 4  Period Weeks    Status Achieved               PT Long Term Goals - 06/03/21 0949       PT LONG TERM GOAL #1   Title independent with advanced HEP    Time 12    Period Weeks    Status On-going      PT LONG  TERM GOAL #2   Title understand ways to manage pain with meditation, breathing, ways to think of pain    Baseline working on meditation    Time 12    Period Weeks    Status Achieved      PT LONG TERM GOAL #3   Title able to brace her abdomen during daily tasks to reduce the pain and improve her function with abdominal pain not higher than 1-2/10    Baseline pain level 8/10    Time 12    Period Weeks    Status On-going      PT LONG TERM GOAL #4   Title able to start a walking program for 15-30 mintues a day due to pain decrease </= 3/10    Baseline not able to due ot COVID    Time 12    Period Weeks    Status Not Met                   Plan - 06/03/21 1012     Clinical Impression Statement Patient is rcovering from [place]. She was in the bed recovering for 3 weeks and has not been able to atend therapy during that time. Patient has full lumbar ROM with pain. Patient reports her pain level is now an 8/10 and constant since she had COVID. The pain is worse with movement. She has tenderness located on the right lower quadrant, right obturator internist and right side of the bladder. Patients pain decreased to 5/10 after manual work and she was instructed on how to perform internal work on her own. Patient is not able to walking due to recovering from [place] and her pain is 8/10. Patient has to hold her abdomen on the right side to reduce her pain. Bilateral hip strength is 4/5. Pelvic floor strength decreased to 3/5 due to COVID. Patient was not able to do the exercise bike for 5 minutes without many rests due to difficulty breathing. Patient will benefit from skilled therapy to imporve function and decrease pain.    Personal Factors and Comorbidities Comorbidity 2;Sex;Social Background;Fitness;Time since onset of injury/illness/exacerbation    Comorbidities C-section 2x; ADHD    Examination-Activity Limitations Lift;Locomotion Level    Examination-Participation Restrictions  Interpersonal Relationship    Stability/Clinical Decision Making Stable/Uncomplicated    Rehab Potential Good    PT Duration 12 weeks    PT Treatment/Interventions Biofeedback;Cryotherapy;Electrical Stimulation;Iontophoresis 22m/ml Dexamethasone;Moist Heat;Ultrasound;Therapeutic activities;Therapeutic exercise;Manual techniques;Patient/family education;Neuromuscular re-education;Dry needling;Spinal Manipulations    PT Next Visit Plan see how her manual work to the right pelvic floor going, manual work to the right pelvic floor, lunge with arm raise overhead to stretch the right hip flexor, core strength supine,    PT Home Exercise Plan Access Code: [email or web address]   Recommended Other Services sent MD renewal; the initial has not been signed and we have contacted the doctor many times to sign    Consulted and Agree with Plan of Care Patient             Patient will benefit from skilled therapeutic intervention in order to  improve the following deficits and impairments:  Decreased activity tolerance, Decreased strength, Increased fascial restricitons, Pain, Decreased mobility, Increased muscle spasms, Decreased range of motion  Visit Diagnosis: Muscle weakness (generalized) - Plan: PT plan of care cert/re-cert  Cramp and spasm - Plan: PT plan of care cert/re-cert  Pelvic pain - Plan: PT plan of care cert/re-cert  Generalized abdominal pain - Plan: PT plan of care cert/re-cert     Problem List Patient Active Problem List   Diagnosis Date Noted   History of COVID-19 05/26/2021   Cough 05/26/2021   Moderate persistent asthma without complication 41/28/7867   Seasonal and perennial allergic rhinitis 04/22/2021   Seasonal allergic conjunctivitis 04/22/2021   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, [place], [place], [address] Phone: [phone]   Fax:  [phone]  Name: Penny [name] MRN: [id number] Date of Birth: Aug 28, 1981

## 2021-06-03 NOTE — Telephone Encounter (Signed)
-----   Message from Dara Hoyer, FNP sent at 06/01/2021  3:35 PM EDT ----- Can you please refer this patient to Mila Homer, ENT specialist, for chronic cough or possible VCD with no improvement despite aggressive therapy? Thank you

## 2021-06-07 ENCOUNTER — Other Ambulatory Visit: Payer: Self-pay

## 2021-06-07 ENCOUNTER — Ambulatory Visit (INDEPENDENT_AMBULATORY_CARE_PROVIDER_SITE_OTHER): Payer: Medicaid Other

## 2021-06-07 DIAGNOSIS — J4540 Moderate persistent asthma, uncomplicated: Secondary | ICD-10-CM

## 2021-06-07 DIAGNOSIS — J4541 Moderate persistent asthma with (acute) exacerbation: Secondary | ICD-10-CM

## 2021-06-09 ENCOUNTER — Other Ambulatory Visit: Payer: Self-pay

## 2021-06-09 ENCOUNTER — Emergency Department (HOSPITAL_COMMUNITY): Payer: Medicaid Other

## 2021-06-09 ENCOUNTER — Emergency Department (HOSPITAL_BASED_OUTPATIENT_CLINIC_OR_DEPARTMENT_OTHER): Payer: Medicaid Other

## 2021-06-09 ENCOUNTER — Emergency Department (HOSPITAL_BASED_OUTPATIENT_CLINIC_OR_DEPARTMENT_OTHER)
Admission: EM | Admit: 2021-06-09 | Discharge: 2021-06-09 | Disposition: A | Discharge status: Home / Self Care | Payer: Medicaid Other | Attending: Emergency Medicine | Admitting: Emergency Medicine

## 2021-06-09 ENCOUNTER — Encounter (HOSPITAL_BASED_OUTPATIENT_CLINIC_OR_DEPARTMENT_OTHER): Payer: Self-pay

## 2021-06-09 DIAGNOSIS — F17210 Nicotine dependence, cigarettes, uncomplicated: Secondary | ICD-10-CM | POA: Insufficient documentation

## 2021-06-09 DIAGNOSIS — J4540 Moderate persistent asthma, uncomplicated: Secondary | ICD-10-CM | POA: Insufficient documentation

## 2021-06-09 DIAGNOSIS — Z7951 Long term (current) use of inhaled steroids: Secondary | ICD-10-CM | POA: Insufficient documentation

## 2021-06-09 DIAGNOSIS — Z20822 Contact with and (suspected) exposure to covid-19: Secondary | ICD-10-CM | POA: Diagnosis not present

## 2021-06-09 DIAGNOSIS — R42 Dizziness and giddiness: Secondary | ICD-10-CM

## 2021-06-09 DIAGNOSIS — Z8616 Personal history of COVID-19: Secondary | ICD-10-CM | POA: Diagnosis not present

## 2021-06-09 DIAGNOSIS — I10 Essential (primary) hypertension: Secondary | ICD-10-CM | POA: Insufficient documentation

## 2021-06-09 DIAGNOSIS — Z79899 Other long term (current) drug therapy: Secondary | ICD-10-CM | POA: Insufficient documentation

## 2021-06-09 DIAGNOSIS — R791 Abnormal coagulation profile: Secondary | ICD-10-CM | POA: Diagnosis not present

## 2021-06-09 LAB — RAPID URINE DRUG SCREEN, HOSP PERFORMED
Amphetamines: NOT DETECTED
Barbiturates: NOT DETECTED
Benzodiazepines: NOT DETECTED
Cocaine: NOT DETECTED
Opiates: NOT DETECTED
Tetrahydrocannabinol: NOT DETECTED

## 2021-06-09 LAB — COMPREHENSIVE METABOLIC PANEL
ALT: 14 U/L (ref 0–44)
AST: 14 U/L — ABNORMAL LOW (ref 15–41)
Albumin: 4.4 g/dL (ref 3.5–5.0)
Alkaline Phosphatase: 72 U/L (ref 38–126)
Anion gap: 9 (ref 5–15)
BUN: 6 mg/dL (ref 6–20)
CO2: 27 mmol/L (ref 22–32)
Calcium: 9.1 mg/dL (ref 8.9–10.3)
Chloride: 104 mmol/L (ref 98–111)
Creatinine, Ser: 0.72 mg/dL (ref 0.44–1.00)
GFR, Estimated: >60 mL/min (ref >60)
Glucose, Bld: 88 mg/dL (ref 70–99)
Potassium: 4.2 mmol/L (ref 3.5–5.1)
Sodium: 140 mmol/L (ref 135–145)
Total Bilirubin: 0.5 mg/dL (ref 0.3–1.2)
Total Protein: 7.7 g/dL (ref 6.5–8.1)

## 2021-06-09 LAB — URINALYSIS, ROUTINE W REFLEX MICROSCOPIC
Bilirubin Urine: NEGATIVE
Glucose, UA: NEGATIVE mg/dL
Ketones, ur: NEGATIVE mg/dL
Leukocytes,Ua: NEGATIVE
Nitrite: NEGATIVE
Protein, ur: NEGATIVE mg/dL
Specific Gravity, Urine: 1.008 (ref 1.005–1.030)
pH: 6 (ref 5.0–8.0)

## 2021-06-09 LAB — CBG MONITORING, ED: Glucose-Capillary: 93 mg/dL (ref 70–99)

## 2021-06-09 LAB — RESP PANEL BY RT-PCR (FLU A&B, COVID) ARPGX2
Influenza A by PCR: NEGATIVE
Influenza B by PCR: NEGATIVE
SARS Coronavirus 2 by RT PCR: NEGATIVE

## 2021-06-09 LAB — DIFFERENTIAL
Abs Immature Granulocytes: 0.06 10*3/uL (ref 0.00–0.07)
Basophils Absolute: 0.1 10*3/uL (ref 0.0–0.1)
Basophils Relative: 1 %
Eosinophils Absolute: 0.1 10*3/uL (ref 0.0–0.5)
Eosinophils Relative: 1 %
Immature Granulocytes: 1 %
Lymphocytes Relative: 18 %
Lymphs Abs: 2.2 10*3/uL (ref 0.7–4.0)
Monocytes Absolute: 0.7 10*3/uL (ref 0.1–1.0)
Monocytes Relative: 5 %
Neutro Abs: 9.6 10*3/uL — ABNORMAL HIGH (ref 1.7–7.7)
Neutrophils Relative %: 74 %

## 2021-06-09 LAB — CBC
HCT: 43.9 % (ref 36.0–46.0)
Hemoglobin: 14.6 g/dL (ref 12.0–15.0)
MCH: 28.2 pg (ref 26.0–34.0)
MCHC: 33.3 g/dL (ref 30.0–36.0)
MCV: 84.9 fL (ref 80.0–100.0)
Platelets: 347 10*3/uL (ref 150–400)
RBC: 5.17 MIL/uL — ABNORMAL HIGH (ref 3.87–5.11)
RDW: 15 % (ref 11.5–15.5)
WBC: 12.7 10*3/uL — ABNORMAL HIGH (ref 4.0–10.5)
nRBC: 0 % (ref 0.0–0.2)

## 2021-06-09 LAB — APTT: aPTT: 28 seconds (ref 24–36)

## 2021-06-09 LAB — ETHANOL: Alcohol, Ethyl (B): <10 mg/dL (ref <10)

## 2021-06-09 LAB — PROTIME-INR
INR: 0.9 (ref 0.8–1.2)
Prothrombin Time: 12.3 seconds (ref 11.4–15.2)

## 2021-06-09 LAB — PREGNANCY, URINE: Preg Test, Ur: NEGATIVE

## 2021-06-09 MED ORDER — ASPIRIN 81 MG PO CHEW
324.0000 mg | CHEWABLE_TABLET | Freq: Once | ORAL | Status: AC
  Administered 2021-06-09: 324 mg via ORAL
  Filled 2021-06-09: qty 4

## 2021-06-09 MED ORDER — LORAZEPAM 2 MG/ML IJ SOLN
2.0000 mg | Freq: Once | INTRAMUSCULAR | Status: AC | PRN
  Administered 2021-06-09: 2 mg via INTRAVENOUS
  Filled 2021-06-09: qty 1

## 2021-06-09 NOTE — ED Notes (Signed)
Dr. Darl Householder notified that pt wants to leave. Pt to be dispo AMA. As this RN went to discharge pt, transport came to get pt for MRI. Pt is going to stay for MRI. Pt given ativan prior to transport to MRI.

## 2021-06-09 NOTE — ED Notes (Signed)
Patient transported to MRI 

## 2021-06-09 NOTE — ED Provider Notes (Signed)
Care of the patient assumed at the change of shift. Here for dizziness and possible ataxia. Made a Code Stroke per Neurologist recommendations. Tele-neuro does not recommend iPA. Pending lab evaluation here and then plan transfer to [facility] for MRI.  Physical Exam  BP 116/72   Pulse 70   Temp 98.7 F (37.1 C) (Oral)   Resp 14   Ht '5\' 3"'$  (1.6 m)   Wt 90 kg   SpO2 95%   BMI 35.15 kg/m   Physical Exam  ED Course/Procedures   Clinical Course as of 06/09/21 0928  Wed Jun 09, 2021  0811 UA and UDS normal. CBC with mild leukocytosis of unclear significance, otherwise normal.  [CS]  0927 CMP is normal.  [CS]    Clinical Course User Index [CS] Truddie Hidden, MD    Procedures  MDM  ED workup here is unremarkable. Will transfer to the [facility] for MRI. Dr. Almyra Free accepting. Patient aware of plan. She is sleeping soundly now.        Truddie Hidden, MD 06/09/21 [phone]

## 2021-06-09 NOTE — ED Triage Notes (Signed)
Patient BIB [facility] from Home with HTN.  Patient states she awoke this AM and upon awakening, the patient felt weak, unsteady, and anxious. Patient checked her VS and CBG, all of which were normal besides BP which was 174/113.  Patient has not been able to maintain BP Medications regularly.   Ambulatory but Unsteady. A&Ox4. Patient did take Metoprolol and Hydrochlorothiazide prior to arrival with improvement in BP.

## 2021-06-09 NOTE — Discharge Instructions (Addendum)
Stay hydrated.  You did not have a stroke on your MRI today  See neurology for follow-up  Return to ER if you have worse dizziness, passing out, trouble speaking

## 2021-06-09 NOTE — ED Provider Notes (Signed)
  Physical Exam  BP 136/87 (BP Location: Right Arm)   Pulse 69   Temp 98.6 F (37 C) (Oral)   Resp 16   Ht '5\' 3"'$  (1.6 m)   Wt 90 kg   SpO2 95%   BMI 35.15 kg/m   Physical Exam  ED Course/Procedures   Clinical Course as of 06/09/21 2122  Wed Jun 09, 2021  0811 UA and UDS normal. CBC with mild leukocytosis of unclear significance, otherwise normal.  [CS]  0927 CMP is normal.  [CS]    Clinical Course User Index [CS] Truddie Hidden, MD    Procedures  MDM   Patient here with dizziness.  Transferred for MRI.  9:23 PM MRI brain unremarkable.  Patient has been ambulatory in the ED.  Stable for discharge     Drenda Freeze, MD 06/09/21 2123

## 2021-06-09 NOTE — ED Notes (Signed)
Care Handoff/Report given to Gwen Her., RN at this time.

## 2021-06-09 NOTE — Consult Note (Signed)
[facility] Tele[facility]urology Consult Services   Date of Service:   06/09/2021 06:58:30  Diagnosis:       I63.9 - Cerebrovascular accident (CVA), unspecified mechanism ([place])  Impression:      patient is a 40 year old woman with history of hypertension prediabetes and tobacco use. her symptoms prior to arrival her somewhat vague with shakiness a sense of lightheadedness no actual vertigo. On exam however she endorses left arm and leg numbness with comparison to the right. It's possible there is an anxiety component to her symptoms, however she also has hypertension and prediabetes and tobacco use. To air on the side of caution recommend starting her on aspirin for now and MRI brain to exclude stroke. If no stroke is seen would have a low index of suspicion for a vascular event. Can check orthostatic vital signs.  Metrics: Last Known Well: 06/09/2021 04:00:00 [facility] Notification Time: 06/09/2021 06:58:30 Arrival Time: 06/09/2021 06:16:00 Stamp Time: 06/09/2021 06:58:30 Initial Response Time: 06/09/2021 07:01:15 Symptoms: unsteadiness. NIHSS Start Assessment Time: 06/09/2021 07:05:24 Patient is not a candidate for Thrombolytic. Thrombolytic Medical Decision: 06/09/2021 07:11:50 Patient was not deemed candidate for Thrombolytic because of following reasons: No disabling symptoms.  CT head showed no acute hemorrhage or acute core infarct.  ED Physician notified of diagnostic impression and management plan on 06/09/2021 07:22:02  Advanced Imaging: Advanced Imaging Not Recommended because: family history are not consistent with probable large vessel occlusion   Our recommendations are outlined below.  Recommendations:        Stroke/Telemetry Floor       Neuro Checks       Bedside Swallow Eval       DVT Prophylaxis       IV Fluids, Normal Saline       Head of Bed 30 Degrees       Euglycemia and Avoid Hyperthermia (PRN Acetaminophen)       Initiate or  continue Aspirin 325 MG daily       Antihypertensives PRN if Blood pressure is greater than 220/120 or there is a concern for End organ damage/contraindications for permissive HTN. If blood pressure is greater than 220/120 give labetalol PO or IV or Vasotec IV with a goal of 15% reduction in BP during the first 24 hours.  Routine Consultation with [facility] for Follow up Care  Sign Out:       Discussed with Emergency Department Provider    ------------------------------------------------------------------------------  History of Present Illness: Patient is a 40 year old Female.  Patient was brought by EMS for symptoms of unsteadiness.  patient is a 40 year old woman who states that at about 4 AM she started to have trouble feeling shaky all over and trouble standing. She felt lightheaded and faint. She denied any focal weakness numbness or cueing that she was aware of. She has HTN, and a hx of tobacco use. She has pre-diabetes.   Past Medical History:      Hypertension  Anticoagulant use:  No  Antiplatelet use: No  Allergies:  Reviewed    Examination: BP(154/102), Pulse(75), Blood Glucose(93) 1A: Level of Consciousness - Alert; keenly responsive + 0 1B: Ask Month and Age - Both Questions Right + 0 1C: Blink Eyes & Squeeze Hands - Performs Both Tasks + 0 2: Test Horizontal Extraocular Movements - Normal + 0 3: Test Visual Fields - No Visual Loss + 0 4: Test Facial Palsy (Use Grimace if Obtunded) - Normal symmetry + 0 5A: Test Left Arm Motor Drift - No Drift  for 10 Seconds + 0 5B: Test Right Arm Motor Drift - No Drift for 10 Seconds + 0 6A: Test Left Leg Motor Drift - No Drift for 5 Seconds + 0 6B: Test Right Leg Motor Drift - No Drift for 5 Seconds + 0 7: Test Limb Ataxia (FNF/Heel-Shin) - No Ataxia + 0 8: Test Sensation - Mild-Moderate Loss: Less Sharp/More Dull + 1 9: Test Language/Aphasia - Normal; No aphasia + 0 10: Test Dysarthria - Normal + 0 11: Test  Extinction/Inattention - No abnormality + 0  NIHSS Score: 1   Pre-Morbid Modified Rankin Scale: 0 Points = No symptoms at all   Patient/Family was informed the Neurology Consult would occur via TeleHealth consult by way of interactive audio and video telecommunications and consented to receiving care in this manner.   Patient is being evaluated for possible acute neurologic impairment and high probability of imminent or life-threatening deterioration. I spent total of 35 minutes providing care to this patient, including time for face to face visit via telemedicine, review of medical records, imaging studies and discussion of findings with providers, the patient and/or family.   Dr Katina Degree   [facility] [phone]  Case [id number]

## 2021-06-09 NOTE — ED Notes (Signed)
Patient verbalizes understanding of discharge instructions. Follow-up care reviewed. Opportunity for questioning and answers were provided. Armband removed by staff, pt discharged from ED ambulatory.  

## 2021-06-09 NOTE — ED Provider Notes (Signed)
[facility] Provider Note   CSN: [id number] Arrival date & time: 06/09/21  3846     History Chief Complaint  Patient presents with   Hypertension    Penny [name] is a 40 y.o. female.  The history is provided by the patient.  Hypertension She has history of hypertension, prediabetes, asthma, depression, attention deficit disorder and comes in because of feeling shaky, unsteady on her feet, and elevated blood pressure.  She noted when she was going to bed between 4 AM and 4:30 AM that she was feeling very shaky.  She checked her vital signs and noted her blood pressure was significantly elevated although heart rate was normal.  She tried to walk down the hall and was unable to do so.  She required her husband to support her while she was also holding on.  She has never had anything like this before.  She denies any headache and denies any focal weakness and denies any numbness.     Past Medical History:  Diagnosis Date   ADHD (attention deficit hyperactivity disorder)    Anxiety    no meds   Asthma    Chronic back pain    Depression    no meds   Essential hypertension    a. Dx 06/2016.   Gastroesophageal reflux disease 04/22/2021   Migraines    last one over 1 month ago   Overweight    Palpitations - Sinus Tachycardia    a. 12/2016 Echo: E f55-60%, no rwma, nl LA/RA sizes.   Placenta previa    w/ 2 pregnancies.   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   Gastroesophageal reflux disease 04/22/2021   Tobacco use 04/22/2021   Moderate  persistent asthma with acute exacerbation 08/01/2020   Uterine fibroid 01/23/2016   Right groin pain 01/23/2016   History of C-section 01/23/2016   Migraines    Anxiety    Depression     Past Surgical History:  Procedure Laterality Date   CESAREAN SECTION  2010   x 1 at 36 wks in [place]   CESAREAN SECTION  February 13, 2012   CHROMOPERTUBATION  10/26/2015   Procedure: ATTEMPTED CHROMOPERTUBATION;  Surgeon: Crawford Givens, MD;  Location: [facility];  Service: Gynecology;;   LAPAROSCOPIC LYSIS OF ADHESIONS  10/26/2015   Procedure: LAPAROSCOPIC LYSIS OF ADHESIONS;  Surgeon: Crawford Givens, MD;  Location: [facility];  Service: Gynecology;;   LAPAROSCOPY N/A 10/26/2015   Procedure: LAPAROSCOPY OPERATIVE WITH REMOVAL OF MISPLACED FILSHE CLIP;  Surgeon: Crawford Givens, MD;  Location: [facility];  Service: Gynecology;  Laterality: N/A;   SVD  2003   x 1 in [place]   TUBAL LIGATION     WISDOM TOOTH EXTRACTION       OB History     Gravida  3   Para  3   Term  1   Preterm  2   AB  0   Living  3      SAB  0   IAB  0   Ectopic  0   Multiple  0   Live Births  3           Family History  Problem Relation Age of Onset   Cancer Mother    CAD Mother    Hypertension Sister    Sickle cell trait Daughter    Asthma Son    Asthma Paternal Grandmother    Pseudochol deficiency Neg Hx    Malignant hyperthermia Neg Hx    Anesthesia problems Neg Hx    Hypotension Neg Hx     Social History   Tobacco Use   Smoking status: Every Day    Packs/day: 0.10    Years: 18.00    Pack years: 1.80    Types: Cigarettes   Smokeless tobacco: Never  Vaping Use   Vaping Use: Former  Substance Use Topics   Alcohol use: Yes    Comment: Rare    Drug use: Not Currently    Types: Marijuana    Home Medications Prior to Admission medications   Medication Sig Start Date End Date Taking? Authorizing Provider  ACCU-CHEK GUIDE test strip USE 1 strip 2 TIMES DAILY BEFORE MEALS 04/23/20   [provider]  Accu-Chek Softclix Lancets lancets Accu-Chek Softclix Lancets  USE TO CHECK BLOOD SUGAR 2 TIMES DAILY BEFORE MEALS    [provider]  benzonatate (TESSALON) 200 MG capsule benzonatate 200 mg capsule  TAKE 1 CAPSULE BY MOUTH 3 TIMES DAILY AS DIRECTED FOR 5 DAYS    [provider]  Blood Glucose Monitoring Suppl (ACCU-CHEK GUIDE) w/Device KIT See admin instructions. 04/23/20   [provider]  budesonide-formoterol (SYMBICORT) 160-4.5 MCG/ACT inhaler Inhale 2 puffs into the lungs in the morning and at bedtime. 08/01/20   Faustino Congress, NP  cycloSPORINE (RESTASIS) 0.05 % ophthalmic emulsion Restasis 0.05 % eye drops in a dropperette  PLACE 1 DROP IN [place]    [provider]  diclofenac (VOLTAREN) 75 MG EC tablet TAKE 1 TABLET BY MOUTH 2 TIMES DAILY as directed FOR 10 DAYS 01/12/21   [provider]  EPINEPHrine 0.3 mg/0.3 mL IJ SOAJ injection SMARTSIG:1 Syringe(s) IM PRN 01/14/20   [provider]  esomeprazole (NEXIUM) 40 MG capsule Take 1 capsule (40 mg total) by mouth daily. 08/14/20   Vanessa Kick, MD  famotidine (PEPCID) 40 MG tablet famotidine 40 mg tablet  TAKE 1 TABLET BY MOUTH EVERY EVENING    [provider]  fluticasone (FLONASE) 50 MCG/ACT nasal spray fluticasone propionate 50 mcg/actuation nasal spray,suspension  PLACE 1 SPRAY INTO BOTH NOSTRILS DAILY    [provider]  fluticasone (FLOVENT HFA) 110 MCG/ACT inhaler During flare up do 2 puffs twice a day for 2 weeks then stop 06/01/21   Ambs, Kathrine Cords, FNP  gabapentin (NEURONTIN) 800 MG tablet Take 800 mg by mouth 3 (three) times daily.    [provider]  GNP TUSSIN DM 20-200 MG/10ML liquid Take 5 mLs by mouth 3 (three) times daily as needed. 05/28/21   [provider]  hydrochlorothiazide (HYDRODIURIL) 12.5 MG tablet hydrochlorothiazide 12.5 mg tablet  TAKE 1 TABLET BY MOUTH EVERY DAY. need office visit     [provider]  HYDROcodone-acetaminophen (NORCO/VICODIN) 5-325 MG tablet hydrocodone 5 mg-acetaminophen 325 mg tablet  Take 1 tablet every 6 hours by oral route as needed for 2 days.    [provider]  ibuprofen (ADVIL) 800 MG tablet Take by mouth.    [provider]  levalbuterol (  XOPENEX HFA) 45 MCG/ACT inhaler Inhale 1 puff every 4-6 hours as needed for cough or wheeze. 04/29/21   Dara Hoyer, FNP  levalbuterol (XOPENEX) 1.25 MG/3ML nebulizer solution Take 1.25 mg by nebulization every 4 (four) hours as needed for wheezing. 05/31/21   Ambs, Kathrine Cords, FNP  levocetirizine (XYZAL) 5 MG tablet Take 1 tablet (5 mg total) by mouth every evening. 07/29/20   Volney American, PA-C  levonorgestrel (MIRENA, 52 MG,) 20 MCG/24HR IUD Mirena 20 mcg/24 hours (7 yrs) 52 mg intrauterine device  Take by intrauterine route.    [provider]  lidocaine (XYLOCAINE) 5 % ointment lidocaine 5 % topical ointment  apply A pea sized AMOUNT TO affected AREA 2 TIMES DAILY    [provider]  metoprolol succinate (TOPROL-XL) 25 MG 24 hr tablet Take 25 mg by mouth 2 (two) times daily. 07/29/20   [provider]  montelukast (SINGULAIR) 10 MG tablet Take by mouth. 07/29/20   [provider]  nicotine (NICODERM CQ - DOSED IN MG/24 HOURS) 14 mg/24hr patch nicotine 14 mg/24 hr daily transdermal patch  Apply 1 patch every day by transdermal route in the morning for 30 days.    [provider]  Olopatadine HCl 0.7 % SOLN Apply 1 drop to eye daily. 04/29/21   Ambs, Kathrine Cords, FNP  predniSONE (DELTASONE) 20 MG tablet prednisone 20 mg tablet  TAKE 2 TABLETS BY MOUTH EVERY DAY WITH BREAKFAST FOR 5 DAYS    [provider]  promethazine (PHENERGAN) 25 MG tablet promethazine 25 mg tablet  TAKE 1 TABLET BY MOUTH EVERY 6 HOURS AS NEEDED FOR NAUSEA AND vomiting    [provider]  Tiotropium Bromide Monohydrate (SPIRIVA RESPIMAT) 1.25 MCG/ACT AERS  Inhale 2 puffs into the lungs daily. 05/21/21   Dara Hoyer, FNP  tiZANidine (ZANAFLEX) 4 MG tablet Take by mouth. 02/24/21   [provider]  traZODone (DESYREL) 100 MG tablet Take 100-200 mg by mouth at bedtime as needed. 05/13/21   [provider]  Ubrogepant (UBRELVY) 100 MG TABS Ubrelvy 100 mg tablet  Take 1 tablet as needed by oral route as needed for 30 days.    [provider]  Cetirizine HCl 10 MG CAPS Take 1 capsule (10 mg total) by mouth daily for 10 days. Patient not taking: Reported on 06/22/2019 02/11/19 08/18/19  Wieters, Elesa Hacker, PA-C  Dextromethorphan HBr 10 MG/5ML SYRP 10 ml qhs prn cough at night time. Patient not taking: Reported on 06/22/2019 02/04/19 08/18/19  Rodriguez-Southworth, Sunday Spillers, PA-C  norethindrone (AYGESTIN) 5 MG tablet Take by mouth. 05/07/20 05/28/20  [provider]  Topiramate ER (TROKENDI XR) 200 MG CP24 Take 1 capsule by mouth daily. 08/27/19 05/28/20  [provider]    Allergies    Other, Shrimp extract allergy skin test, Shellfish-derived products, and Shrimp [shellfish allergy]  Review of Systems   Review of Systems  All other systems reviewed and are negative.  Physical Exam Updated Vital Signs BP (!) 139/102 (BP Location: Right Arm)   Pulse 88   Temp 98.7 F (37.1 C) (Oral)   Resp 16   Ht 5' 3" (1.6 m)   Wt 90 kg   SpO2 100%   BMI 35.15 kg/m   Physical Exam Vitals and nursing note reviewed.  40 year old female, resting comfortably and in no acute distress. Vital signs are significant for elevated blood pressure. Oxygen saturation is 100%, which is normal. Head is normocephalic and atraumatic. PERRLA,  EOMI. Oropharynx is clear. Neck is nontender and supple without adenopathy or JVD.  There are no carotid bruits. Back is nontender and there is no CVA tenderness. Lungs are clear without rales, wheezes, or rhonchi. Chest is nontender. Heart has regular rate and rhythm without murmur. Abdomen is soft,  flat, nontender without masses or hepatosplenomegaly and peristalsis is normoactive. Extremities have no cyanosis or edema, full range of motion is present. Skin is warm and dry without rash. Neurologic: Awake, alert, oriented.  Speech is normal.  Cranial nerves are intact.  There is no pronator drift.  Muscle strength is 5/5 in both arms and both legs.  There are no sensory deficits and there is no extinction on double simultaneous stimulation.  Finger-nose testing is normal.  On Romberg testing, she is very unsteady without any tendency to fall in 1 direction.  Fine tremor is noted.  ED Results / Procedures / Treatments   Labs (all labs ordered are listed, but only abnormal results are displayed) Labs Reviewed  RESP PANEL BY RT-PCR (FLU A&B, COVID) ARPGX2  ETHANOL  PROTIME-INR  APTT  CBC  DIFFERENTIAL  COMPREHENSIVE METABOLIC PANEL  RAPID URINE DRUG SCREEN, HOSP PERFORMED  URINALYSIS, ROUTINE W REFLEX MICROSCOPIC  PREGNANCY, URINE  CBG MONITORING, ED    EKG None  Radiology CT HEAD CODE STROKE WO CONTRAST  Result Date: 06/09/2021 CLINICAL DATA:  Code stroke. 40 year old female with weakness, unsteady upon waking. Hypertensive. EXAM: CT HEAD WITHOUT CONTRAST TECHNIQUE: Contiguous axial images were obtained from the base of the skull through the vertex without intravenous contrast. COMPARISON:  Head CT 06/22/2019. FINDINGS: Brain: Normal cerebral volume. Gray-white matter differentiation appears stable and within normal limits. Stable small perivascular spaces associated with the right temporal lobe are suspected, such as on series 3, image 11. No midline shift, ventriculomegaly, mass effect, evidence of mass lesion, intracranial hemorrhage or evidence of cortically based acute infarction. Vascular: No suspicious intracranial vascular hyperdensity. Skull: Stable, negative. Sinuses/Orbits: Visualized paranasal sinuses and mastoids are stable and well aerated. Other: Visualized orbits and  scalp soft tissues are within normal limits. ASPECTS [facility] Stroke Program Early CT Score) Total score (0-10 with 10 being normal): 10 IMPRESSION: Stable since 2020 and normal noncontrast CT appearance of the brain. ASPECTS 10. Study discussed by telephone with Dr. Delora Fuel on 8/85/0277 at 07:08 . Electronically Signed   By: Genevie Ann M.D.   On: 06/09/2021 07:08    Procedures Procedures  CRITICAL CARE Performed by: Delora Fuel Total critical care time: 45 minutes Critical care time was exclusive of separately billable procedures and treating other patients. Critical care was necessary to treat or prevent imminent or life-threatening deterioration. Critical care was time spent personally by me on the following activities: development of treatment plan with patient and/or surrogate as well as nursing, discussions with consultants, evaluation of patient's response to treatment, examination of patient, obtaining history from patient or surrogate, ordering and performing treatments and interventions, ordering and review of laboratory studies, ordering and review of radiographic studies, pulse oximetry and re-evaluation of patient's condition.  Medications Ordered in ED Medications  aspirin chewable tablet 324 mg (has no administration in time range)    ED Course  I have reviewed the triage vital signs and the nursing notes.  Pertinent labs & imaging results that were available during my care of the patient were reviewed by me and considered in my medical decision making (see chart for details).   MDM Rules/Calculators/A&P  Sudden onset of difficulty with balance and tremor.  The cause of this is unclear.  I did discuss with Dr. Rory Percy, neuro hospitalist at [facility], and decision was made to proceed with code stroke.  Code stroke was activated.  Old records are reviewed, and she has no relevant past visits.  CT of head is stable, no acute findings.  Neurology  consult is appreciated, recommendation is for MRI to rule out stroke.  Labs are still pending, she will need to be sent to [facility] for MRI.  Case is signed out to Dr. Karle Starch.  Final Clinical Impression(s) / ED Diagnoses Final diagnoses:  Dizziness  Elevated blood pressure reading with diagnosis of hypertension    Rx / DC Orders ED Discharge Orders     None        Delora Fuel, MD 71/69/67 0730

## 2021-06-09 NOTE — ED Notes (Signed)
Pt in CT with charge nurse.

## 2021-06-15 NOTE — Telephone Encounter (Signed)
Penny [name],  I spoke with [name] & she states she will try her best to go to [place] to see Dr Rowe Clack due to transportation.  I was doing some research on providers in the [place] area who specialize in Chronic Cough & Vocal Cord Dysfunction. [facility] sees patients for these diagnosis. Are you okay with referring her there ?  Thanks

## 2021-06-15 NOTE — Telephone Encounter (Signed)
If she is breathing well, please have her stop the Flovent inhaler. She should continue all inhalers except the Flovent inhaler. Thank you

## 2021-06-15 NOTE — Telephone Encounter (Signed)
Patient is scheduled with Dr Lamonte Sakai on 07/21/2021 @ 2:30. I left a detailed voicemail for the patient.  Patient is also wondering if she is still supposed to be taking the Flovent inhaler?  Please Advise

## 2021-06-15 NOTE — Telephone Encounter (Signed)
Thank you for your hard work on this referral.  I know transportation can be a barrier for this patient.  I would be happy for her to go to [facility] for treatment. Thank you

## 2021-06-16 NOTE — Telephone Encounter (Signed)
Thank you :)

## 2021-06-21 ENCOUNTER — Other Ambulatory Visit: Payer: Self-pay

## 2021-06-21 ENCOUNTER — Ambulatory Visit (INDEPENDENT_AMBULATORY_CARE_PROVIDER_SITE_OTHER): Payer: Medicaid Other | Admitting: *Deleted

## 2021-06-21 DIAGNOSIS — J4540 Moderate persistent asthma, uncomplicated: Secondary | ICD-10-CM | POA: Diagnosis not present

## 2021-06-22 ENCOUNTER — Ambulatory Visit: Payer: Self-pay

## 2021-06-30 ENCOUNTER — Ambulatory Visit: Payer: Medicaid Other | Attending: Nurse Practitioner | Admitting: Physical Therapy

## 2021-06-30 ENCOUNTER — Encounter: Payer: Self-pay | Admitting: Physical Therapy

## 2021-06-30 ENCOUNTER — Other Ambulatory Visit: Payer: Self-pay

## 2021-06-30 DIAGNOSIS — M6281 Muscle weakness (generalized): Secondary | ICD-10-CM | POA: Insufficient documentation

## 2021-06-30 DIAGNOSIS — R1084 Generalized abdominal pain: Secondary | ICD-10-CM | POA: Insufficient documentation

## 2021-06-30 DIAGNOSIS — R102 Pelvic and perineal pain: Secondary | ICD-10-CM | POA: Insufficient documentation

## 2021-06-30 DIAGNOSIS — R252 Cramp and spasm: Secondary | ICD-10-CM | POA: Insufficient documentation

## 2021-06-30 NOTE — Patient Instructions (Addendum)
Toileting Techniques for Bowel Movements    An Evacuation/Defecation Plan   Here are the 4 basic points:  Lean forward enough for your elbows to rest on your knees Support your feet on the floor or use a low stool if your feet don't touch the floor  Push out your belly as if you have swallowed a beach ball--you should feel a widening of your waist. "Belly Big, Belly Hard" Open and relax your pelvic floor muscles, rather than tightening around the anus  While you are sitting on the toilet pay attention to the following areas: Jaw and mouth position- relaxed not clenched Angle of your hips - leaning slightly forward Whether your feet touch the ground or not - should be flat and supported Arm placement - rest against your thighs Spine position - flat back Waist Breathing - exhale as you push (like blowing up a balloon or try using other sounds such as ahhhh, shhhhh, ohhhh or grrrrrrr) Penny [name] - hard and tight as you push Anus (opening of the anal canal) - relaxed and open as you push Anus - Tighten and lift pulling the muscle back in after you are done or if taking a break  If you are not successful after 10-15 minutes, try again later.  Avoid negative self-talk about your toileting experience.    Sitting on the toilet  a) Make sure your feet are supported - flat on the floor or step stool b) Many people find it effective to lean forward or raise their knees.  Propping your feet on a step stool (Squatty Potty is a brand name) can help the muscles around the anus to relax  c) When you lean forward, place your forearms on your thighs for support  Relaxing Breathe deeply and slowly in through your nose and out through your mouth. To become aware of how to relax your muscles, contracting and releasing muscles can be helpful.  Pull your pelvic floor muscles in tightly by using the image of holding back gas, or closing around the anus (visualize making a circle smaller) and lifting the anus up  and in.  Then release the muscles and your anus should drop down and feel open. Repeat 5 times ending with the feeling of relaxation. Keep your pelvic floor muscles relaxed; let your belly bulge out. The digestive tract starts at the mouth and ends at the anal opening, so be sure to relax both ends of the tube.  Place your tongue on the roof of your mouth with your teeth separated.  This helps relax your mouth and will help to relax the anus at the same time.  Emptying (defecation) a) Keep your pelvic floor and sphincter relaxed, then bulge your anal muscles.  Make the anal opening wide.  b) Stick your belly out as if you have swallowed a beach ball. c) Make your belly wall hard using your belly muscles while continuing to breathe. Doing this makes it easier to open your anus. d) Breath out and give a grunt (or try using other sounds such as ahhhh, shhhhh, ohhhh or grrrrrrr). e)  Can also try to act as if you are blowing up a balloon as you push  4) Finishing a) As you finish your bowel movement, pull the pelvic floor muscles up and in.  This will leave your anus in the proper place rather than remaining pushed out and down. If you leave your anus pushed out and down, it will start to feel as though that is  normal and give you incorrect signals about needing to have a bowel movement. Access Code: [email or web address] URL: [email or web address] Date: 06/30/2021 Prepared by: Penny [name]  Program Notes lay on back with feel on wall, have buttocks as close to the wall as possible, keep knees and feet apart hold for 1 min, do your belly breathing   Exercises Supine Bridge - 1 x daily - 7 x weekly - 1 sets - 10 reps Supine Piriformis Stretch with Leg Straight - 1 x daily - 7 x weekly - 1 sets - 2 reps - 30 sec hold Half Kneeling Hip Flexor Stretch with Sidebend - 1 x daily - 7 x weekly - 1 sets - 2 reps - 30 hold Prone Alternating Arm and Leg Lifts - 1 x daily - 7 x weekly - 1 sets - 10  reps Bird Dog on Counter - 1 x daily - 7 x weekly - 1 sets - 10 reps Deadlift with Resistance - 1 x daily - 7 x weekly - 1 sets - 10 reps Standing Anti-Rotation Press with Anchored Resistance - 1 x daily - 7 x weekly - 1 sets - 10 reps Supine Quadriceps Stretch with Strap on Table - 1 x daily - 7 x weekly - 1 sets - 2 reps - 30 sec hold [facility] [address] [place], [place] [address] Phone # [phone] Fax [phone]

## 2021-06-30 NOTE — Therapy (Addendum)
[facility] [address] [place], [place], [address] Phone: [phone]   Fax:  [phone]  Physical Therapy Treatment  Patient Details  Name: Penny [name] MRN: [id number] Date of Birth: 01-04-1981 Referring Provider (PT): Claris Gladden. Ouida Sills, NP   Encounter Date: 06/30/2021   PT End of Session - 06/30/21 0916     Visit Number 8    Date for PT Re-Evaluation 08/17/21    Authorization Type Medicaid    Authorization Time Period 5/26-8/17    Authorization - Visit Number 5    Authorization - Number of Visits 12    PT Start Time 0845    PT Stop Time 0093    PT Time Calculation (min) 40 min    Activity Tolerance Patient tolerated treatment well;No increased pain    Behavior During Therapy WFL for tasks assessed/performed             Past Medical History:  Diagnosis Date   ADHD (attention deficit hyperactivity disorder)    Anxiety    no meds   Asthma    Chronic back pain    Depression    no meds   Essential hypertension    a. Dx 06/2016.   Gastroesophageal reflux disease 04/22/2021   Migraines    last one over 1 month ago   Overweight    Palpitations - Sinus Tachycardia    a. 12/2016 Echo: E f55-60%, no rwma, nl LA/RA sizes.   Placenta previa    w/ 2 pregnancies.   Pre-diabetes    Preterm labor    c/s at 36 wks   Recurrent upper respiratory infection (URI)    Scoliosis    Sinus tachycardia     Past Surgical History:  Procedure Laterality Date   CESAREAN SECTION  2010   x 1 at 36 wks in [place]   CESAREAN SECTION  February 13, 2012   CHROMOPERTUBATION  10/26/2015   Procedure: ATTEMPTED CHROMOPERTUBATION;  Surgeon: Crawford Givens, MD;  Location: [facility];  Service: Gynecology;;   LAPAROSCOPIC LYSIS OF ADHESIONS  10/26/2015   Procedure: LAPAROSCOPIC LYSIS OF ADHESIONS;  Surgeon: Crawford Givens, MD;  Location: [facility];  Service: Gynecology;;   LAPAROSCOPY N/A 10/26/2015   Procedure: LAPAROSCOPY  OPERATIVE WITH REMOVAL OF MISPLACED FILSHE CLIP;  Surgeon: Crawford Givens, MD;  Location: [facility];  Service: Gynecology;  Laterality: N/A;   SVD  2003   x 1 in [place] EXTRACTION      There were no vitals filed for this visit.   Subjective Assessment - 06/30/21 0851     Subjective I am doing good until last week. When I have a bowel movement it hurts.    Patient Stated Goals reduce her pain    Currently in Pain? Yes    Pain Score 5     Pain Location Rectum    Pain Orientation Mid    Pain Descriptors / Indicators Burning    Pain Type Acute pain    Pain Onset More than a month ago    Pain Frequency Intermittent    Aggravating Factors  pushing a bowel movement out    Pain Relieving Factors not pushing a bowel movement    Multiple Pain Sites No    Pain Score 7    Pain Location Abdomen    Pain Orientation Right    Pain Descriptors / Indicators Throbbing    Pain Type Chronic pain  Pain Onset More than a month ago    Pain Frequency Intermittent    Aggravating Factors  bowel movement    Pain Relieving Factors not having a bowel movement                [facility] PT Assessment - 06/30/21 0001       Assessment   Medical Diagnosis R10.2 pelvic and perineal pain    Referring Provider (PT) Claris Gladden. Ouida Sills, NP    Onset Date/Surgical Date 11/05/20    Prior Therapy had pelvic floor PT in the past      Precautions   Precautions None      Restrictions   Weight Bearing Restrictions No      Home Environment   Living Environment Private residence      Prior Function   Level of Independence Independent    Leisure walking daily      Cognition   Overall Cognitive Status Within Functional Limits for tasks assessed      Posture/Postural Control   Posture/Postural Control Postural limitations    Posture Comments rib hump on the upper right thoracic      AROM   Lumbar Flexion full   has to go slowly due to being stiff   Lumbar Extension full     Lumbar - Right Side Bend full    Lumbar - Right Rotation full    Lumbar - Left Rotation full      Palpation   SI assessment  ASIS are equal    Palpation comment tenderness throughout the abdomen, right groin                        Pelvic Floor Special Questions - 06/30/21 0001     Strength fair squeeze, definite lift               [facility] Adult PT Treatment/Exercise - 06/30/21 0001       Therapeutic Activites    Therapeutic Activities Other Therapeutic Activities    Other Therapeutic Activities educated patient on correct movements including diaphragmatic breathing, knees above hips, how to breath out to contract the lower abdominals and relax the anus. Patient was able to demonstrate correctly      Lumbar Exercises: Stretches   Hip Flexor Stretch Right;Left;3 reps;30 seconds   supine with leg off mat   Hip Flexor Stretch Limitations 1 time with therapist assisting then the other times with a towel    Press Ups 10 reps    Other Lumbar Stretch Exercise supine with feet on wall, with knees and feet apart to expand the pelvic floor 2 minutes.      Lumbar Exercises: Aerobic   Nustep 7 minutes level 1 whiel assessing patient                    PT Education - 06/30/21 0928     Education Details [id number]; educated patient on how to toilet correctly    Person(s) Educated Patient    Methods Explanation;Demonstration;Verbal cues;Handout    Comprehension Returned demonstration;Verbalized understanding              PT Short Term Goals - 06/03/21 0949       PT SHORT TERM GOAL #1   Title pt to be I with intial HEP     Time 4    Period Weeks    Status Achieved  PT Long Term Goals - 06/30/21 0917       PT LONG TERM GOAL #1   Title independent with advanced HEP    Baseline continuing to learn as she progresses    Time 12    Period Weeks    Status On-going      PT LONG TERM GOAL #2   Title understand ways to manage pain  with meditation, breathing, ways to think of pain    Time 12    Period Weeks    Status Achieved      PT LONG TERM GOAL #3   Title able to brace her abdomen during daily tasks to reduce the pain and improve her function with abdominal pain not higher than 1-2/10    Baseline lower abdominal pain is more intermittent and level 7/10    Time 12    Period Weeks    Status On-going      PT LONG TERM GOAL #4   Title able to start a walking program for 15-30 mintues a day due to pain decrease </= 3/10    Baseline not able to due ot COVID but trying since covid    Time 12    Period Weeks    Status On-going                   Plan - 06/30/21 0929     Clinical Impression Statement Patient pelvic floor strength is 3/5. She has pain with prone press up due to some decreased movement of T10-T12. Bilateral hip strength is 4/5. Patient has had COVID so she was not able to attend therapy for 3 weeks. She is not on a full walking program due to building up her strength from having [place]. Patient has 5/10 pain when she has a bowel movement in the past week. Therapist showed her stretches for the area and correct toileting techniques to help address this. Patient reports her right lower quadrant pain is 7/10 and less frequent. She will have it more with a bowel movement and may come on suddenly. Patient has tenderness located in the right lower quadrant, right obturator internist, and right side of the bladder. Patient will benefit from skilled therapy to improve function and decrease pain.    Personal Factors and Comorbidities Comorbidity 2;Sex;Social Background;Fitness;Time since onset of injury/illness/exacerbation    Comorbidities C-section 2x; ADHD    Examination-Activity Limitations Lift;Locomotion Level    Examination-Participation Restrictions Interpersonal Relationship    Stability/Clinical Decision Making Stable/Uncomplicated    Rehab Potential Good    PT Frequency 1x / week    PT Duration  12 weeks    PT Treatment/Interventions Biofeedback;Cryotherapy;Electrical Stimulation;Iontophoresis 42m/ml Dexamethasone;Moist Heat;Ultrasound;Therapeutic activities;Therapeutic exercise;Manual techniques;Patient/family education;Neuromuscular re-education;Dry needling;Spinal Manipulations    PT Next Visit Plan see how her manual work to the right pelvic floor going, manual work to the right pelvic floor, lunge with arm raise overhead to stretch the right hip flexor, core strength supine,    PT Home Exercise Plan Access Code: [email or web address]   Recommended Other Services waiting for MD to sign her notes then Medicaid renewal will be placed in    Consulted and Agree with Plan of Care Patient             Patient will benefit from skilled therapeutic intervention in order to improve the following deficits and impairments:  Decreased activity tolerance, Decreased strength, Increased fascial restricitons, Pain, Decreased mobility, Increased muscle spasms, Decreased range of motion  Visit Diagnosis: Muscle weakness (generalized)  Cramp and spasm  Pelvic pain  Generalized abdominal pain     Problem List Patient Active Problem List   Diagnosis Date Noted   History of COVID-19 05/26/2021   Cough 05/26/2021   Moderate persistent asthma without complication 26/83/4196   Seasonal and perennial allergic rhinitis 04/22/2021   Seasonal allergic conjunctivitis 04/22/2021   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, [place], [place], [address] Phone: [phone]   Fax:  [phone]  Name:  Penny [name] MRN: [id number] Date of Birth: 06-02-1981  PHYSICAL THERAPY DISCHARGE SUMMARY  Visits from Start of Care: 8  Current functional level related to goals / functional outcomes: See above. Patient no showed on her last visit on 08/17/2021.    Remaining deficits: See above. Unable to reassess patient due to not returning.    Education / Equipment: HEP   Patient agrees to discharge. Patient goals were not met. Patient is being discharged due to not returning since the last visit. Thank you for the referral. Earlie Counts, PT 09/17/21 10:34 AM

## 2021-07-05 ENCOUNTER — Other Ambulatory Visit: Payer: Self-pay

## 2021-07-05 ENCOUNTER — Ambulatory Visit (INDEPENDENT_AMBULATORY_CARE_PROVIDER_SITE_OTHER): Payer: Medicaid Other | Admitting: *Deleted

## 2021-07-05 DIAGNOSIS — J4540 Moderate persistent asthma, uncomplicated: Secondary | ICD-10-CM

## 2021-07-06 ENCOUNTER — Encounter: Payer: Medicaid Other | Admitting: Physical Therapy

## 2021-07-07 DIAGNOSIS — R7303 Prediabetes: Secondary | ICD-10-CM | POA: Insufficient documentation

## 2021-07-07 DIAGNOSIS — I10 Essential (primary) hypertension: Secondary | ICD-10-CM | POA: Insufficient documentation

## 2021-07-07 DIAGNOSIS — E663 Overweight: Secondary | ICD-10-CM | POA: Insufficient documentation

## 2021-07-07 DIAGNOSIS — J45909 Unspecified asthma, uncomplicated: Secondary | ICD-10-CM | POA: Insufficient documentation

## 2021-07-07 DIAGNOSIS — R000 Tachycardia, unspecified: Secondary | ICD-10-CM | POA: Insufficient documentation

## 2021-07-07 DIAGNOSIS — R002 Palpitations: Secondary | ICD-10-CM | POA: Insufficient documentation

## 2021-07-07 DIAGNOSIS — J069 Acute upper respiratory infection, unspecified: Secondary | ICD-10-CM | POA: Insufficient documentation

## 2021-07-07 DIAGNOSIS — G8929 Other chronic pain: Secondary | ICD-10-CM | POA: Insufficient documentation

## 2021-07-07 DIAGNOSIS — M419 Scoliosis, unspecified: Secondary | ICD-10-CM | POA: Insufficient documentation

## 2021-07-07 DIAGNOSIS — M549 Dorsalgia, unspecified: Secondary | ICD-10-CM | POA: Insufficient documentation

## 2021-07-07 DIAGNOSIS — F909 Attention-deficit hyperactivity disorder, unspecified type: Secondary | ICD-10-CM | POA: Insufficient documentation

## 2021-07-12 ENCOUNTER — Encounter: Payer: Self-pay | Admitting: *Deleted

## 2021-07-13 ENCOUNTER — Ambulatory Visit: Payer: Medicaid Other | Admitting: Cardiology

## 2021-07-13 ENCOUNTER — Telehealth: Payer: Self-pay | Admitting: *Deleted

## 2021-07-13 NOTE — Telephone Encounter (Signed)
Patient called yesterday and stated that she has moved to [place], [place] and was inquiring about her Xolair injections. I advised that she will need to establish care with an asthma and allergy physician there and get her injections restarted. Patient verbalized understanding. Tammy Voncannon has been notified so that her Xolair is not ordered.

## 2021-07-20 ENCOUNTER — Ambulatory Visit: Payer: Medicaid Other | Admitting: Neurology

## 2021-07-20 ENCOUNTER — Ambulatory Visit: Payer: Medicaid Other

## 2021-07-21 ENCOUNTER — Institutional Professional Consult (permissible substitution): Payer: Medicaid Other | Admitting: Emergency Medicine

## 2021-07-30 ENCOUNTER — Ambulatory Visit: Payer: Medicaid Other | Admitting: Family Medicine

## 2021-08-17 ENCOUNTER — Encounter: Payer: Medicaid Other | Attending: Nurse Practitioner | Admitting: Physical Therapy

## 2021-08-17 ENCOUNTER — Telehealth: Payer: Self-pay | Admitting: Physical Therapy

## 2021-08-17 NOTE — Telephone Encounter (Signed)
Called patient about her missed appointment today at 9:30. Left a message.  Earlie Counts, PT @10 /02/2021@ 9:53 AM

## 2021-12-23 NOTE — Telephone Encounter (Signed)
error 

## 2022-05-26 ENCOUNTER — Telehealth: Payer: Self-pay | Admitting: Family Medicine

## 2022-05-26 NOTE — Telephone Encounter (Signed)
I contacted Penny [name] or the patient's caregiver to inform them that she received an expired dose of Pfizer COVID-19 vaccine from our office, during their visit(s), on 04/07/2021. The dose was expired by 7 days. This was the patient's 3rd dose. I shared the following information with the patient or caregiver: vaccines given after they have expired may be less effective but we are not aware of any other adverse effects. The patient can be re-vaccinated at no cost if the patient decides to do so. Answered patient questions/concerns. Encouraged patient to reach out if they have any additional questions or concerns.  The patient decided to be re-vaccinated with no vaccine at this time.
